# Patient Record
Sex: Male | Born: 1955 | Race: White | Hispanic: No | Marital: Married | Smoking: Former smoker
Health system: Southern US, Community
[De-identification: ages and names within clinical notes are randomized; demographics above are authoritative.]

## PROBLEM LIST (undated history)

## (undated) DIAGNOSIS — D6861 Antiphospholipid syndrome: Secondary | ICD-10-CM

## (undated) DIAGNOSIS — I10 Essential (primary) hypertension: Secondary | ICD-10-CM

## (undated) DIAGNOSIS — I4510 Unspecified right bundle-branch block: Secondary | ICD-10-CM

## (undated) DIAGNOSIS — Z9861 Coronary angioplasty status: Secondary | ICD-10-CM

## (undated) DIAGNOSIS — I2510 Atherosclerotic heart disease of native coronary artery without angina pectoris: Secondary | ICD-10-CM

## (undated) DIAGNOSIS — G4733 Obstructive sleep apnea (adult) (pediatric): Secondary | ICD-10-CM

## (undated) DIAGNOSIS — J449 Chronic obstructive pulmonary disease, unspecified: Secondary | ICD-10-CM

## (undated) DIAGNOSIS — I2699 Other pulmonary embolism without acute cor pulmonale: Secondary | ICD-10-CM

## (undated) DIAGNOSIS — I739 Peripheral vascular disease, unspecified: Secondary | ICD-10-CM

## (undated) DIAGNOSIS — E119 Type 2 diabetes mellitus without complications: Secondary | ICD-10-CM

## (undated) DIAGNOSIS — G450 Vertebro-basilar artery syndrome: Secondary | ICD-10-CM

## (undated) DIAGNOSIS — R760 Raised antibody titer: Secondary | ICD-10-CM

## (undated) DIAGNOSIS — G4730 Sleep apnea, unspecified: Secondary | ICD-10-CM

## (undated) HISTORY — PX: BACK SURGERY: SHX140

## (undated) HISTORY — PX: COLONOSCOPY: SHX174

## (undated) HISTORY — PX: APPENDECTOMY: SHX54

## (undated) HISTORY — PX: CORONARY ANGIOPLASTY WITH STENT PLACEMENT: SHX49

## (undated) HISTORY — DX: Obstructive sleep apnea (adult) (pediatric): G47.33

## (undated) HISTORY — DX: Essential (primary) hypertension: I10

## (undated) HISTORY — DX: Raised antibody titer: R76.0

## (undated) HISTORY — DX: Atherosclerotic heart disease of native coronary artery without angina pectoris: I25.10

## (undated) HISTORY — DX: Vertebro-basilar artery syndrome: G45.0

## (undated) HISTORY — DX: Chronic obstructive pulmonary disease, unspecified: J44.9

## (undated) HISTORY — DX: Unspecified right bundle-branch block: I45.10

## (undated) HISTORY — DX: Type 2 diabetes mellitus without complications: E11.9

## (undated) NOTE — *Deleted (*Deleted)
Name: Jose [name] MRN: [id number] DOB: 08/28/1955 53 y.o. PCP: Jose Jenkins, MD  Date of Admission: 01/18/2020  1:57 PM Date of Discharge:  Attending Physician: Tyson Alias, *  Discharge Diagnosis: 1. Community acquired pneumonia 2. COPD (chronic obstructive pulmonary disease) (HCC) exacerbation. 3. Non-insulin treated type 2 diabetes mellitus (HCC) 4.  Acute on chronic right heart failure (HCC) 5. Anti-phospholipid syndrome [facility])   Discharge Medications: Allergies as of 01/22/2020      Reactions   Melatonin Anxiety   Ramipril    cough      Medication List    STOP taking these medications   doxycycline 100 MG tablet Commonly known as: VIBRA-TABS   enoxaparin 120 MG/0.8ML injection Commonly known as: LOVENOX Replaced by: enoxaparin 30 MG/0.3ML injection   torsemide 20 MG tablet Commonly known as: DEMADEX     TAKE these medications   accu-chek soft touch lancets 2 (two) times daily.   aspirin 81 MG EC tablet Take 1 tablet (81 mg total) by mouth daily. Swallow whole. Start taking on: January 23, 2020 What changed: additional instructions   Contour Next Test test strip Generic drug: glucose blood Use to check blood sugar twice 2 time(s) daily.   DULoxetine 60 MG capsule Commonly known as: CYMBALTA Take 60 mg by mouth once a week.   enoxaparin 30 MG/0.3ML injection Commonly known as: Lovenox Inject 1.1 mLs (110 mg total) into the skin 2 (two) times daily for 2 days. Replaces: enoxaparin 120 MG/0.8ML injection   Flovent Diskus 100 MCG/BLIST Aepb Generic drug: Fluticasone Propionate (Inhal) Inhale 1 puff into the lungs daily as needed (wheezing & shortness of breath).   gabapentin 600 MG tablet Commonly known as: NEURONTIN Take 600 mg by mouth 3 (three) times daily.   GlucoCom Blood Glucose Monitor Devi 1 each by XX route as directed (For Blood Glucose Monitoring)   Contour Next Monitor w/Device Kit by Does not apply route.    HYDROcodone-acetaminophen 10-325 MG tablet Commonly known as: NORCO Take 1 tablet by mouth 2 (two) times daily.   ipratropium-albuterol 0.5-2.5 (3) MG/3ML Soln Commonly known as: DUONEB Inhale 3 mLs into the lungs at bedtime.   metoprolol tartrate 12.5 mg Tabs tablet Commonly known as: LOPRESSOR Take 12.5 mg by mouth 2 (two) times daily.   nicotine 14 mg/24hr patch Commonly known as: NICODERM CQ - dosed in mg/24 hours Apply one patch daily x 2 weeks.  Begin after completion of 21 mg patches   nitroGLYCERIN 0.4 MG SL tablet Commonly known as: NITROSTAT Place 0.4 mg under the tongue every 5 (five) minutes as needed for chest pain.   potassium chloride 20 MEQ packet Commonly known as: KLOR-CON Take 40 mEq by mouth 2 (two) times daily.   albuterol 108 (90 Base) MCG/ACT inhaler Commonly known as: VENTOLIN HFA Inhale 1 puff into the lungs every 6 (six) hours as needed for wheezing or shortness of breath.   ProAir HFA 108 (90 Base) MCG/ACT inhaler Generic drug: albuterol Inhale 1-2 puffs into the lungs every 4 (four) hours as needed for wheezing.   albuterol (5 MG/ML) 0.5% nebulizer solution Commonly known as: PROVENTIL Take 0.5 mLs (2.5 mg total) by nebulization every 6 (six) hours as needed for wheezing or shortness of breath.   rosuvastatin 10 MG tablet Commonly known as: CRESTOR Take 10 mg by mouth every evening.   Stiolto Respimat 2.5-2.5 MCG/ACT Aers Generic drug: Tiotropium Bromide-Olodaterol Inhale 2 puffs into the lungs daily.   tamsulosin 0.4 MG Caps  capsule Commonly known as: FLOMAX Take 0.4 mg by mouth daily.   tiZANidine 4 MG tablet Commonly known as: ZANAFLEX Take 8 mg by mouth in the morning and at bedtime.   topiramate 50 MG tablet Commonly known as: TOPAMAX Take 50 mg by mouth 2 (two) times daily.   warfarin 7.5 MG tablet Commonly known as: COUMADIN Take 1 tablet (7.5 mg total) by mouth at bedtime for 1 dose. What changed:   medication strength   how much to take  when to take this  additional instructions   warfarin 6 MG tablet Commonly known as: Coumadin Take 1 tablet (6 mg total) by mouth daily for 2 days. What changed:   medication strength  how much to take  additional instructions            Durable Medical Equipment  (From admission, onward)         Start     Ordered   01/22/20 1512  DME 3-in-1  Once        01/22/20 1512          Disposition and follow-up:   Mr.Jose [name] was discharged from [facility] in Stable condition.  At the hospital follow up visit please address:  1.  INR and his home lasix need  2.  Labs / imaging needed at time of follow-up: INR, cystoscopy  3.  Pending labs/ test needing follow-up: None  Follow-up Appointments:  Follow-up Information    Jose Jenkins, MD. Go on 01/25/2020.   Specialty: Internal Medicine Why: @9 :30 AM Contact information: [address] [place] [address] [phone]               Hospital Course by problem list: Acute on chronic hypoxic and hypercapneic respiratory failure in setting of community acquired pneumonia:  Presenting with dyspnea, productive cough with hemoptysis. CXR with medial right lung base opacity. CT Abdomen was significant for patchy consolidation at the right lung base suggestive of multilobar bronchopneumonia. Continues to require 4L supplemental oxygen at this time. He used  BiPAP at night as needed.  He was started on azithromycin and ceftriaxone and stopped on day 4. Continued on Duonebs q6h and albuterol inhaler q4h prn.   Acute metabolic encephalopathy 2/2 hypercarbia from persistent COPD:  He has a history of COPD and obstructive sleep apnea. He was noted to have worsening respiratory acidosis on 11/6 for which he was initiated on BiPAP. Subsequently, had improvement in mental status. He notes that he has both BiPAP and CPAP at home and planning to use it after discharge at  night as needed.   Acute on chronic HFpEF exacerbation: He has history of HFpEF and on torsemide 20mg  qd at home. He has been diuresed with IV Lasix 40mg  twice daily;  he is noted to have ~500cc UOP over past 24 hours. On examination, has improved bilateral lower extremity edema. His Creatinine is 1.10, BUN 22 on the day of discharge. His IV lasix were stopped and he is asked to ee his PCP before starting his home dose torsemide if needed.    Antiphospholipid antibody syndrome with supratherapeutic INR: Patient presented with supratherapeutic INR with hemoptysis and hematuria. His warfarin was held and patient started on heparin gtt. Currently INR of 1.3. No active bleeding noted. Hemoglobin has remained stable. His  IV Heparin was stopped on 01/22/20. He was started on Lovenox 110 mg IV q12hr - until he follows up with his Coumadin Clinic. Plan is to  take Warfarin 7.5 mg po x 1 on 01/22/20 and then start 6 mg po daily until he follows up with his Coumadin clinic   Hematuria:  On admission, patient endorsed one month of hematuria but unable to quantify how much. Denies any associated urinary symptoms. Abdominal exam benign. However, UA was significant for >50 RBCs. Suspect his supratherapeutic INR may have been contributing to his hematuria. CT Abd/Pelvis obtained that was significant for nonobstructing bilateral nephrolithiasis without hydronephrosis, renal cortical masses or evidence of urothelial lesions.  - Outpatient follow up with Urology for cystoscopy  Discharge Vitals:   BP 127/70 (BP Location: Left Wrist)   Pulse 86   Temp 98.7 F (37.1 C)   Resp 16   Ht 5' 7.01" (1.702 m)   Wt 247 lb 5.7 oz (112.2 kg)   SpO2 92%   BMI 38.73 kg/m   Pertinent Labs, Studies, and Procedures:  CBC Latest Ref Rng & Units 01/22/2020 01/21/2020 01/20/2020  WBC 4.0 - 10.5 K/uL 11.3(H) 10.6(H) 12.3(H)  Hemoglobin 13.0 - 17.0 g/dL 12.8(L) 13.3 14.2  Hematocrit 39 - 52 % 41.8 43.0 46.0  Platelets 150 - 400  K/uL 339 300 379   BMP Latest Ref Rng & Units 01/22/2020 01/21/2020 01/20/2020  Glucose 70 - 99 mg/dL 045(W) 098(J) 191(Y)  BUN 8 - 23 mg/dL 22 16 14   Creatinine 0.61 - 1.24 mg/dL 7.82 9.56 2.13  Sodium 135 - 145 mmol/L 141 141 138  Potassium 3.5 - 5.1 mmol/L 3.3(L) 3.8 3.9  Chloride 98 - 111 mmol/L 96(L) 98 98  CO2 22 - 32 mmol/L 35(H) 31 31  Calcium 8.9 - 10.3 mg/dL 0.8(M) 8.9 9.2   Prothrombin Time: 16 (on 01/22/20), 16.6 (01/21/20), 22.4 ( 01/20/20) INR: 1.3 (on 01/22/20), 1.4 (01/21/20), 2.1 ( 01/20/20)    Chest Xray : IMPRESSION: Mild ill-defined opacity within the medial right lung base. Findings may reflect atelectasis or pneumonia. PA and lateral view chest radiograph with improved inspiratory effort may be helpful for further evaluation. Unchanged cardiomegaly. CT abdomen pelvis: IMPRESSION: 1. Nonobstructing bilateral nephrolithiasis. No hydronephrosis. No ureteral or bladder stones. No renal cortical masses. No evidence of urothelial lesions. 2. Patchy consolidation at the right lung base. Patchy tree-in-bud opacities in the dependent basilar left lower lobe. Findings are suggestive of multilobar bronchopneumonia. Chest radiograph or chest CT follow-up suggested in 2-3 months. 3. Mild bilateral external iliac lymphadenopathy, nonspecific. Suggest follow-up CT abdomen/pelvis with oral and IV contrast in 3 months. This recommendation follows ACR consensus guidelines: White Paper of the ACR Incidental Findings Committee II on Splenic and Nodal Findings. J Am Coll Radiol 2013;10:789-794. 4. Diffuse hepatic steatosis. 5. Mild colonic diverticulosis. 6. Coronary atherosclerosis. 7. Aortic Atherosclerosis (ICD10-I70.0).    Discharge Instructions: Discharge Instructions    Call MD for:  temperature >100.4   Complete by: As directed    Diet - low sodium heart healthy   Complete by: As directed    Discharge instructions   Complete by: As directed    Mr. Mckillop: You  presented with Acute on chronic hypoxic and hypercapneic respiratory failure in setting of community acquired pneumonia: You were started on azithromycin and ceftriaxone for t/t of pneumonia and started back on 4L supplemental oxygen .CXR with medial right lung base opacity.CT Abdomen was significant for patchy consolidation at the right lung base suggestive of multilobar bronchopneumonia.Plan is to stop antibiotics today but continue Duonebs q6h and albuterol inhaler q4h prn. You had Acute metabolic encephalopathy2/2 hypercarbia from persistent COPD:You have a  history of COPD and obstructive sleep apnea.Noted to have worsening respiratory acidosis on 11/6 for which you were initiated on BiPAP. Subsequently, had improvement in mental status. You stated you have both BiPAP and CPAP at home and planning to use it after discharge at night as needed. Acute on chronic HFpEF exacerbation: You have history of HFpEF on torsemide 20mg  qd at home.You were diuresed with IV Lasix 40mg  twice daily;  noted to have ~500cc UOP over past 24 hours.. On examination, had improved bilateral lower extremity edema.Creatinine is 1.10, BUN 22. Plan is to stop lasix today and see Cardiology before restarting them but continue metoprolol 12.5mg  bid Antiphospholipid antibody syndrome with supratherapeutic INR: You presented with supratherapeutic INR with hemoptysis and hematuria.  Warfarin was held and were started on heparin gtt. Currently INR of 1.3. No active bleeding noted. Hemoglobin has remained stable.  - Stop IV Heparin today and bridge to warfarin along with Lovenox bridge. Lovenox 110 mg IV q12 hr until you follow your coumadin clinic. Take Warfarin 7.5 mg 1 dose tonight and the start 6 mg PO daily until you follow your coumadin clinic.    Hematuria:  You endorsed one month of hematuria but unable to quantify how much. Denied any associated urinary symptoms. Abdominal exam benign. However, UA was significant for >50  RBCs. Suspect his supratherapeutic INR may have been contributing to his hematuria.CT Abd/Pelvis obtained that was significant for nonobstructing bilateral nephrolithiasis without hydronephrosis, renal cortical masses or evidence of urothelial lesions.  - Outpatient follow up with Urology for cystoscopy   Increase activity slowly   Complete by: As directed       Signed: Arnoldo Lenis, MD 01/22/2020, 3:37 PM   Pager: [phone]

---

## 2009-03-16 DIAGNOSIS — I2699 Other pulmonary embolism without acute cor pulmonale: Secondary | ICD-10-CM

## 2009-03-16 HISTORY — DX: Other pulmonary embolism without acute cor pulmonale: I26.99

## 2011-09-12 DIAGNOSIS — D649 Anemia, unspecified: Secondary | ICD-10-CM | POA: Insufficient documentation

## 2012-08-09 DIAGNOSIS — N529 Male erectile dysfunction, unspecified: Secondary | ICD-10-CM | POA: Insufficient documentation

## 2015-10-15 DIAGNOSIS — I779 Disorder of arteries and arterioles, unspecified: Secondary | ICD-10-CM

## 2015-10-15 HISTORY — DX: Disorder of arteries and arterioles, unspecified: I77.9

## 2015-10-17 DIAGNOSIS — R0609 Other forms of dyspnea: Secondary | ICD-10-CM | POA: Insufficient documentation

## 2016-08-16 ENCOUNTER — Encounter (HOSPITAL_COMMUNITY): Payer: Self-pay | Admitting: Emergency Medicine

## 2016-08-16 ENCOUNTER — Observation Stay (HOSPITAL_COMMUNITY)
Admission: EM | Admit: 2016-08-16 | Discharge: 2016-08-17 | Disposition: A | Discharge status: Home / Self Care | Payer: BLUE CROSS/BLUE SHIELD | Attending: Internal Medicine | Admitting: Internal Medicine

## 2016-08-16 ENCOUNTER — Emergency Department (HOSPITAL_COMMUNITY): Payer: BLUE CROSS/BLUE SHIELD

## 2016-08-16 DIAGNOSIS — Z7901 Long term (current) use of anticoagulants: Secondary | ICD-10-CM

## 2016-08-16 DIAGNOSIS — I779 Disorder of arteries and arterioles, unspecified: Secondary | ICD-10-CM | POA: Diagnosis not present

## 2016-08-16 DIAGNOSIS — I200 Unstable angina: Secondary | ICD-10-CM | POA: Diagnosis present

## 2016-08-16 DIAGNOSIS — I208 Other forms of angina pectoris: Secondary | ICD-10-CM | POA: Diagnosis present

## 2016-08-16 DIAGNOSIS — I252 Old myocardial infarction: Secondary | ICD-10-CM | POA: Insufficient documentation

## 2016-08-16 DIAGNOSIS — I10 Essential (primary) hypertension: Secondary | ICD-10-CM | POA: Diagnosis present

## 2016-08-16 DIAGNOSIS — G4733 Obstructive sleep apnea (adult) (pediatric): Secondary | ICD-10-CM | POA: Diagnosis not present

## 2016-08-16 DIAGNOSIS — N400 Enlarged prostate without lower urinary tract symptoms: Secondary | ICD-10-CM | POA: Diagnosis not present

## 2016-08-16 DIAGNOSIS — D6861 Antiphospholipid syndrome: Secondary | ICD-10-CM | POA: Diagnosis present

## 2016-08-16 DIAGNOSIS — Z955 Presence of coronary angioplasty implant and graft: Secondary | ICD-10-CM | POA: Insufficient documentation

## 2016-08-16 DIAGNOSIS — I2089 Other forms of angina pectoris: Secondary | ICD-10-CM | POA: Diagnosis present

## 2016-08-16 DIAGNOSIS — Z86711 Personal history of pulmonary embolism: Secondary | ICD-10-CM | POA: Diagnosis present

## 2016-08-16 DIAGNOSIS — E669 Obesity, unspecified: Secondary | ICD-10-CM | POA: Diagnosis not present

## 2016-08-16 DIAGNOSIS — F17210 Nicotine dependence, cigarettes, uncomplicated: Secondary | ICD-10-CM | POA: Insufficient documentation

## 2016-08-16 DIAGNOSIS — E782 Mixed hyperlipidemia: Secondary | ICD-10-CM

## 2016-08-16 DIAGNOSIS — Z79899 Other long term (current) drug therapy: Secondary | ICD-10-CM | POA: Insufficient documentation

## 2016-08-16 DIAGNOSIS — Z7982 Long term (current) use of aspirin: Secondary | ICD-10-CM | POA: Diagnosis not present

## 2016-08-16 DIAGNOSIS — Z7902 Long term (current) use of antithrombotics/antiplatelets: Secondary | ICD-10-CM | POA: Diagnosis not present

## 2016-08-16 DIAGNOSIS — E1169 Type 2 diabetes mellitus with other specified complication: Secondary | ICD-10-CM

## 2016-08-16 DIAGNOSIS — I25110 Atherosclerotic heart disease of native coronary artery with unstable angina pectoris: Principal | ICD-10-CM | POA: Insufficient documentation

## 2016-08-16 DIAGNOSIS — E785 Hyperlipidemia, unspecified: Secondary | ICD-10-CM | POA: Diagnosis not present

## 2016-08-16 DIAGNOSIS — Z9861 Coronary angioplasty status: Secondary | ICD-10-CM | POA: Diagnosis not present

## 2016-08-16 DIAGNOSIS — J449 Chronic obstructive pulmonary disease, unspecified: Secondary | ICD-10-CM | POA: Diagnosis not present

## 2016-08-16 DIAGNOSIS — Z720 Tobacco use: Secondary | ICD-10-CM | POA: Diagnosis present

## 2016-08-16 DIAGNOSIS — G4730 Sleep apnea, unspecified: Secondary | ICD-10-CM | POA: Diagnosis present

## 2016-08-16 DIAGNOSIS — E119 Type 2 diabetes mellitus without complications: Secondary | ICD-10-CM

## 2016-08-16 DIAGNOSIS — Z6837 Body mass index (BMI) 37.0-37.9, adult: Secondary | ICD-10-CM | POA: Diagnosis not present

## 2016-08-16 DIAGNOSIS — I739 Peripheral vascular disease, unspecified: Secondary | ICD-10-CM

## 2016-08-16 DIAGNOSIS — R079 Chest pain, unspecified: Secondary | ICD-10-CM | POA: Diagnosis present

## 2016-08-16 DIAGNOSIS — I4510 Unspecified right bundle-branch block: Secondary | ICD-10-CM | POA: Diagnosis not present

## 2016-08-16 DIAGNOSIS — I2510 Atherosclerotic heart disease of native coronary artery without angina pectoris: Secondary | ICD-10-CM

## 2016-08-16 HISTORY — DX: Sleep apnea, unspecified: G47.30

## 2016-08-16 HISTORY — DX: Coronary angioplasty status: Z98.61

## 2016-08-16 HISTORY — DX: Atherosclerotic heart disease of native coronary artery without angina pectoris: I25.10

## 2016-08-16 HISTORY — DX: Chronic obstructive pulmonary disease, unspecified: J44.9

## 2016-08-16 HISTORY — DX: Essential (primary) hypertension: I10

## 2016-08-16 HISTORY — DX: Unspecified right bundle-branch block: I45.10

## 2016-08-16 HISTORY — DX: Other pulmonary embolism without acute cor pulmonale: I26.99

## 2016-08-16 HISTORY — DX: Antiphospholipid syndrome: D68.61

## 2016-08-16 HISTORY — DX: Type 2 diabetes mellitus without complications: E11.9

## 2016-08-16 HISTORY — DX: Peripheral vascular disease, unspecified: I73.9

## 2016-08-16 LAB — LIPID PANEL
CHOL/HDL RATIO: 5.6 ratio
Cholesterol: 156 mg/dL (ref 0–200)
HDL: 28 mg/dL — AB (ref >40)
LDL CALC: 64 mg/dL (ref 0–99)
TRIGLYCERIDES: 322 mg/dL — AB (ref <150)
VLDL: 64 mg/dL — ABNORMAL HIGH (ref 0–40)

## 2016-08-16 LAB — CBC
HCT: 42.5 % (ref 39.0–52.0)
Hemoglobin: 13.7 g/dL (ref 13.0–17.0)
MCH: 30.4 pg (ref 26.0–34.0)
MCHC: 32.2 g/dL (ref 30.0–36.0)
MCV: 94.2 fL (ref 78.0–100.0)
PLATELETS: 241 10*3/uL (ref 150–400)
RBC: 4.51 MIL/uL (ref 4.22–5.81)
RDW: 13.7 % (ref 11.5–15.5)
WBC: 14.8 10*3/uL — ABNORMAL HIGH (ref 4.0–10.5)

## 2016-08-16 LAB — TROPONIN I: Troponin I: <0.03 ng/mL (ref <0.03)

## 2016-08-16 LAB — I-STAT TROPONIN, ED: TROPONIN I, POC: 0 ng/mL (ref 0.00–0.08)

## 2016-08-16 LAB — BASIC METABOLIC PANEL
Anion gap: 4 — ABNORMAL LOW (ref 5–15)
BUN: 14 mg/dL (ref 6–20)
CALCIUM: 8.4 mg/dL — AB (ref 8.9–10.3)
CO2: 28 mmol/L (ref 22–32)
CREATININE: 0.7 mg/dL (ref 0.61–1.24)
Chloride: 108 mmol/L (ref 101–111)
GFR calc non Af Amer: >60 mL/min (ref >60)
Glucose, Bld: 124 mg/dL — ABNORMAL HIGH (ref 65–99)
Potassium: 3.7 mmol/L (ref 3.5–5.1)
Sodium: 140 mmol/L (ref 135–145)

## 2016-08-16 LAB — HEPARIN LEVEL (UNFRACTIONATED): Heparin Unfractionated: 0.29 IU/mL — ABNORMAL LOW (ref 0.30–0.70)

## 2016-08-16 LAB — PROTIME-INR
INR: 1.78
Prothrombin Time: 20.9 seconds — ABNORMAL HIGH (ref 11.4–15.2)

## 2016-08-16 LAB — MRSA PCR SCREENING: MRSA by PCR: NEGATIVE

## 2016-08-16 MED ORDER — ROSUVASTATIN CALCIUM 10 MG PO TABS
10.0000 mg | ORAL_TABLET | Freq: Every evening | ORAL | Status: DC
  Administered 2016-08-16: 10 mg via ORAL
  Filled 2016-08-16: qty 1

## 2016-08-16 MED ORDER — WARFARIN SODIUM 5 MG PO TABS: 5.0000 mg | ORAL_TABLET | Freq: Once | ORAL | Status: DC

## 2016-08-16 MED ORDER — WARFARIN SODIUM 2 MG PO TABS: 8.0000 mg | ORAL_TABLET | Freq: Once | ORAL | Status: DC

## 2016-08-16 MED ORDER — HEPARIN (PORCINE) IN NACL 100-0.45 UNIT/ML-% IJ SOLN
1350.0000 [IU]/h | INTRAMUSCULAR | Status: DC
  Administered 2016-08-16: 1250 [IU]/h via INTRAVENOUS
  Administered 2016-08-17: 1350 [IU]/h via INTRAVENOUS
  Filled 2016-08-16 (×2): qty 250

## 2016-08-16 MED ORDER — CLOPIDOGREL BISULFATE 75 MG PO TABS
75.0000 mg | ORAL_TABLET | Freq: Every day | ORAL | Status: DC
  Administered 2016-08-17: 75 mg via ORAL
  Filled 2016-08-16 (×2): qty 1

## 2016-08-16 MED ORDER — ACETAMINOPHEN 325 MG PO TABS: 650.0000 mg | ORAL_TABLET | ORAL | Status: DC | PRN

## 2016-08-16 MED ORDER — ALPRAZOLAM 0.25 MG PO TABS: 0.2500 mg | ORAL_TABLET | Freq: Two times a day (BID) | ORAL | Status: DC | PRN

## 2016-08-16 MED ORDER — ALBUTEROL SULFATE HFA 108 (90 BASE) MCG/ACT IN AERS: 1.0000 | INHALATION_SPRAY | RESPIRATORY_TRACT | Status: DC | PRN

## 2016-08-16 MED ORDER — ONDANSETRON HCL 4 MG/2ML IJ SOLN: 4.0000 mg | Freq: Four times a day (QID) | INTRAMUSCULAR | Status: DC | PRN

## 2016-08-16 MED ORDER — METOPROLOL SUCCINATE ER 100 MG PO TB24
100.0000 mg | ORAL_TABLET | Freq: Every day | ORAL | Status: DC
  Administered 2016-08-16 – 2016-08-17 (×2): 100 mg via ORAL
  Filled 2016-08-16 (×2): qty 1

## 2016-08-16 MED ORDER — ISOSORBIDE MONONITRATE ER 30 MG PO TB24
60.0000 mg | ORAL_TABLET | Freq: Every day | ORAL | Status: DC
  Filled 2016-08-16: qty 2

## 2016-08-16 MED ORDER — ISOSORBIDE MONONITRATE ER 30 MG PO TB24: 60.0000 mg | ORAL_TABLET | Freq: Every day | ORAL | Status: DC

## 2016-08-16 MED ORDER — TAMSULOSIN HCL 0.4 MG PO CAPS
0.4000 mg | ORAL_CAPSULE | Freq: Every day | ORAL | Status: DC
  Administered 2016-08-16 – 2016-08-17 (×2): 0.4 mg via ORAL
  Filled 2016-08-16 (×2): qty 1

## 2016-08-16 MED ORDER — MORPHINE SULFATE (PF) 4 MG/ML IV SOLN: 1.0000 mg | INTRAVENOUS | Status: DC | PRN

## 2016-08-16 MED ORDER — DIPHENHYDRAMINE HCL 50 MG/ML IJ SOLN
12.5000 mg | Freq: Once | INTRAMUSCULAR | Status: AC
  Administered 2016-08-16: 12.5 mg via INTRAVENOUS
  Filled 2016-08-16: qty 1

## 2016-08-16 MED ORDER — HEPARIN BOLUS VIA INFUSION
4000.0000 [IU] | Freq: Once | INTRAVENOUS | Status: DC
Start: 2016-08-16 — End: 2016-08-16
  Filled 2016-08-16: qty 4000

## 2016-08-16 MED ORDER — ASPIRIN EC 81 MG PO TBEC
81.0000 mg | DELAYED_RELEASE_TABLET | Freq: Every day | ORAL | Status: DC
  Administered 2016-08-17: 81 mg via ORAL
  Filled 2016-08-16 (×2): qty 1

## 2016-08-16 MED ORDER — LOSARTAN POTASSIUM 50 MG PO TABS
50.0000 mg | ORAL_TABLET | Freq: Every day | ORAL | Status: DC
  Administered 2016-08-16 – 2016-08-17 (×2): 50 mg via ORAL
  Filled 2016-08-16 (×2): qty 1

## 2016-08-16 MED ORDER — WARFARIN - PHARMACIST DOSING INPATIENT: Freq: Every day | Status: DC

## 2016-08-16 MED ORDER — PANTOPRAZOLE SODIUM 40 MG PO TBEC
40.0000 mg | DELAYED_RELEASE_TABLET | Freq: Two times a day (BID) | ORAL | Status: DC
  Administered 2016-08-16 – 2016-08-17 (×3): 40 mg via ORAL
  Filled 2016-08-16 (×3): qty 1

## 2016-08-16 MED ORDER — NICOTINE POLACRILEX 2 MG MT GUM
2.0000 mg | CHEWING_GUM | OROMUCOSAL | Status: DC | PRN
  Filled 2016-08-16: qty 1

## 2016-08-16 MED ORDER — NITROGLYCERIN IN D5W 200-5 MCG/ML-% IV SOLN
10.0000 ug/min | INTRAVENOUS | Status: DC
  Administered 2016-08-16: 10 ug/min via INTRAVENOUS
  Filled 2016-08-16: qty 250

## 2016-08-16 MED ORDER — HEPARIN (PORCINE) IN NACL 100-0.45 UNIT/ML-% IJ SOLN
1250.0000 [IU]/h | INTRAMUSCULAR | Status: DC
  Filled 2016-08-16: qty 250

## 2016-08-16 MED ORDER — METOCLOPRAMIDE HCL 5 MG/ML IJ SOLN
10.0000 mg | Freq: Once | INTRAMUSCULAR | Status: AC
  Administered 2016-08-16: 10 mg via INTRAVENOUS
  Filled 2016-08-16: qty 2

## 2016-08-16 MED ORDER — FUROSEMIDE 20 MG PO TABS
20.0000 mg | ORAL_TABLET | Freq: Every day | ORAL | Status: DC
Start: 2016-08-16 — End: 2016-08-17
  Administered 2016-08-16 – 2016-08-17 (×2): 20 mg via ORAL
  Filled 2016-08-16 (×2): qty 1

## 2016-08-16 MED ORDER — TOPIRAMATE 25 MG PO TABS
50.0000 mg | ORAL_TABLET | Freq: Two times a day (BID) | ORAL | Status: DC
Start: 2016-08-16 — End: 2016-08-17
  Administered 2016-08-16 – 2016-08-17 (×3): 50 mg via ORAL
  Filled 2016-08-16 (×3): qty 2

## 2016-08-16 MED ORDER — ALBUTEROL SULFATE (2.5 MG/3ML) 0.083% IN NEBU
3.0000 mL | INHALATION_SOLUTION | RESPIRATORY_TRACT | Status: DC | PRN
Start: 2016-08-16 — End: 2016-08-17
  Filled 2016-08-16 (×2): qty 3

## 2016-08-16 NOTE — ED Triage Notes (Signed)
Pt reports sudden onset chest pain 0230. Pt has significant cardiac history. Normal presentation is jaw and shoulder pain. Had a total of 4 nitro and 324 asa pta.

## 2016-08-16 NOTE — Progress Notes (Signed)
Patient placed on CPAP without complication. Patient states that he may take it off due to being accustomed to nasal pillows.

## 2016-08-16 NOTE — Progress Notes (Addendum)
ANTICOAGULATION CONSULT NOTE - Initial Consult  Pharmacy Consult for Heparin Indication: chest pain/ACS   No Known Allergies  Patient Measurements: Height: 5\' 7"  (170.2 cm) Weight: 230 lb (104.3 kg) IBW/kg (Calculated) : 66.1 Heparin Dosing Weight: 90kg  Vital Signs: Temp: 98.7 F (37.1 C) (06/03 1222) Temp Source: Oral (06/03 1222) BP: 135/75 (06/03 1222) Pulse Rate: 80 (06/03 1222)  Labs:  Recent Labs  08/16/16 0507 08/16/16 0922 08/16/16 1309  HGB 13.7  --   --   HCT 42.5  --   --   PLT 241  --   --   LABPROT 20.9*  --   --   INR 1.78  --   --   HEPARINUNFRC  --   --  0.29*  CREATININE 0.70  --   --   TROPONINI  --  <0.03  --     Estimated Creatinine Clearance: 111.6 mL/min (by C-G formula based on SCr of 0.7 mg/dL).   Medical History: Past Medical History:  Diagnosis Date  . Antiphospholipid antibody syndrome (HCC)   . CAD S/P percutaneous coronary angioplasty '98, '04, Feb 2017   [facility]  . Carotid arterial disease (HCC) 10/2015   moderate bilateral  . COPD (chronic obstructive pulmonary disease) (HCC)   . Coronary artery disease   . Hypertension   . Non-insulin treated type 2 diabetes mellitus (HCC)   . Pulmonary emboli (HCC) 2011  . RBBB   . Sleep apnea     Assessment: 61yo male c/o CP, took NTG and ASA at home, istat troponin negative, to begin heparin; of note pt is on Coumadin PTA (indication unclear), current INR <2, last dose taken 6/2.  Heparin level = 0.29  Goal of Therapy:  Heparin level 0.3-0.7 units/ml Monitor platelets by anticoagulation protocol: Yes   Plan:  Heparin to 1350 units / hr Follow up AM labs Follow up plans for cath  Thank you Okey RegalLisa Powell, PharmD [phone]  08/16/2016,2:40 PM   Addendum -per MD may give coumadin tonight -INR= 1.78 -Home dose: 5mg /day except 7.5mg  MWF  Plan -Coumadin 5mg  po tonight -Daily PT/INR  Harland GermanAndrew Tylan Kinn, Pharm D 08/16/2016 7:01 PM

## 2016-08-16 NOTE — ED Provider Notes (Signed)
[facility]-EMERGENCY DEPT Provider Note   CSN: [id number] Arrival date & time: 08/16/16  0501     History   Chief Complaint Chief Complaint  Patient presents with  . Chest Pain    HPI Jose [name] is a 61 y.o. male hx of COPD, CAD s/p 7 stents, HTN, Antiphospholipid syndrome on Coumadin who presenting with chest pain. Patient states that he was trying to get to bed around 2:30 AM when he had sudden onset of jaw pain and substernal chest pain and that radiated down his left arm. He has some shortness of breath at that time as well. He took full dose aspirin and 3 nitroglycerin but still had 6 out of 10 pain. Came here for evaluation. Of note, patient had 2 cardiac stents done at [facility] about a year ago for similar symptoms. He just moved here to [facility] and has no cardiologist here.   The history is provided by the patient.    Past Medical History:  Diagnosis Date  . COPD (chronic obstructive pulmonary disease) (HCC)   . Coronary artery disease   . Hypertension     There are no active problems to display for this patient.   No past surgical history on file.     Home Medications    Prior to Admission medications   Not on File    Family History No family history on file.  Social History Social History  Substance Use Topics  . Smoking status: Not on file  . Smokeless tobacco: Not on file  . Alcohol use Not on file     Allergies   Patient has no allergy information on record.   Review of Systems Review of Systems  Cardiovascular: Positive for chest pain.  All other systems reviewed and are negative.    Physical Exam Updated Vital Signs BP 133/70 (BP Location: Right Arm)   Pulse 92   Temp 98 F (36.7 C) (Oral)   Resp 20   SpO2 97%   Physical Exam  Constitutional: He is oriented to person, place, and time.  Slightly uncomfortable   HENT:  Head: Normocephalic.  Mouth/Throat: Oropharynx is clear and moist.  Eyes: Conjunctivae and EOM are normal.  Pupils are equal, round, and reactive to light.  Neck: Normal range of motion. Neck supple.  Cardiovascular: Normal rate, regular rhythm and normal heart sounds.   Pulmonary/Chest: Effort normal and breath sounds normal. No respiratory distress. He has no wheezes. He has no rales.  Abdominal: Soft. Bowel sounds are normal. He exhibits no distension. There is no tenderness.  Musculoskeletal: Normal range of motion. He exhibits no edema.  Neurological: He is alert and oriented to person, place, and time. No cranial nerve deficit. Coordination normal.  Skin: Skin is warm.  Psychiatric: He has a normal mood and affect.  Nursing note and vitals reviewed.    ED Treatments / Results  Labs (all labs ordered are listed, but only abnormal results are displayed) Labs Reviewed  BASIC METABOLIC PANEL  CBC  PROTIME-INR  I-STAT TROPOININ, ED    EKG  EKG Interpretation  Date/Time:  Sunday August 16 2016 05:04:55 EDT Ventricular Rate:  92 PR Interval:    QRS Duration: 155 QT Interval:  378 QTC Calculation: 468 R Axis:   87 Text Interpretation:  Sinus rhythm Ventricular premature complex Borderline prolonged PR interval Right bundle branch block No previous ECGs available Confirmed by Richardean Canal [phone]) on 08/16/2016 5:25:01 AM       Radiology No results found.  Procedures Procedures (including critical care time)  CRITICAL CARE Performed by: Richardean Canalavid H Ajiah Mcglinn   Total critical care time: 30 minutes  Critical care time was exclusive of separately billable procedures and treating other patients.  Critical care was necessary to treat or prevent imminent or life-threatening deterioration.  Critical care was time spent personally by me on the following activities: development of treatment plan with patient and/or surrogate as well as nursing, discussions with consultants, evaluation of patient's response to treatment, examination of patient, obtaining history from patient or surrogate,  ordering and performing treatments and interventions, ordering and review of laboratory studies, ordering and review of radiographic studies, pulse oximetry and re-evaluation of patient's condition.   Medications Ordered in ED Medications  nitroGLYCERIN 50 mg in dextrose 5 % 250 mL (0.2 mg/mL) infusion (not administered)  metoCLOPramide (REGLAN) injection 10 mg (not administered)  diphenhydrAMINE (BENADRYL) injection 12.5 mg (not administered)     Initial Impression / Assessment and Plan / ED Course  I have reviewed the triage vital signs and the nursing notes.  Pertinent labs & imaging results that were available during my care of the patient were reviewed by me and considered in my medical decision making (see chart for details).     [name] is a 61 y.o. male here with chest pain. Has 7 cardiac stents with similar symptoms. Consider unstable angina. Has no records [facility], [facility] Will start nitro drip. Will consult cardiology.   5:44 AM I talked to Dr. Orson AloeHenderson from cardiology. Given initial trop neg, he will see patient but recommend admission to medicine service. Will try to get record from [facility].  6:08 AM INR 1.8. Put on heparin drip. Hospitalist to admit to stepdown for unstable angina.   Final Clinical Impressions(s) / ED Diagnoses   Final diagnoses:  None    New Prescriptions New Prescriptions   No medications on file     Charlynne PanderYao, Avraham Benish Hsienta, MD 08/16/16 [phone]

## 2016-08-16 NOTE — Consult Note (Signed)
Cardiology Consultation:   Patient ID: Jose [name]; [id number]; Aug 16, 1955   Admit date: 08/16/2016 Date of Consult: 08/16/2016  Primary Care Provider: [name], MD Primary Cardiologist: new (formerly Dr Jesse Fall)    Patient Profile:   Jose [name] is a 61 y.o. male with a hx of CAD who is being seen today for the evaluation of chest pain at the request of Dr Willette Pa.  History of Present Illness:   Jose [name] is a complicated 61 y/o obese male with a history of multiple PCI's at [facility], hypercoagulable state on Coumadin, HTN, DM-diet, HLD, sleep apnea, and moderate carotids disease. He moved to [place] 5 months ago and has had no follow up since. He has antiphospholipid antibody syndrome and needs to have venipuncture INR's but hasn't had one in months. His last PCI was Feb 2017. His cath showed 50% ostial LAD, occluded mCFX, 70% mRCA ISR, and 70% distal RCA. The RCA Lesions were treated with DES. Dr Christell Constant said he felt it would be difficult to intervene on the CFX lesion. The pt did have a Myoview and echo in Sept 2017 but those reports were not available to me.    He recently was placed on C-pap and noted some SOB at night with this. He says he has been working with them to adjust it but last night he had SOB and jaw, Lt shoulder, and Lt arm pain typical of his angina symptoms. He took NTG with no relief and EMS was called. His Troponin have been negative. His INR was sub therapeutic and Heparin has been started. He would like to transfer his cardiology care here.    Past Medical History:  Diagnosis Date  . Antiphospholipid antibody syndrome (HCC)   . CAD S/P percutaneous coronary angioplasty '98, '04, Feb 2017   [facility]  . Carotid arterial disease (HCC) 10/2015   moderate bilateral  . COPD (chronic obstructive pulmonary disease) (HCC)   . Coronary artery disease   . Hypertension   . Non-insulin treated type 2 diabetes mellitus (HCC)   . Pulmonary emboli (HCC) 2011  . RBBB    . Sleep apnea     Past Surgical History:  Procedure Laterality Date  . APPENDECTOMY    . BACK SURGERY  '89, '06  . COLONOSCOPY    . CORONARY ANGIOPLASTY WITH STENT PLACEMENT  '98, '04, Feb 2017     Inpatient Medications: Scheduled Meds: . [START ON 08/17/2016] aspirin EC  81 mg Oral Daily  . [START ON 08/17/2016] clopidogrel  75 mg Oral Daily  . furosemide  20 mg Oral Daily  . [START ON 08/17/2016] isosorbide mononitrate  60 mg Oral Daily  . losartan  50 mg Oral Daily  . metoprolol succinate  100 mg Oral Daily  . pantoprazole  40 mg Oral BID  . rosuvastatin  10 mg Oral QPM  . tamsulosin  0.4 mg Oral Daily  . topiramate  50 mg Oral BID  . warfarin  8 mg Oral ONCE-1800  . Warfarin - Pharmacist Dosing Inpatient   Does not apply q1800   Continuous Infusions: . heparin 1,250 Units/hr (08/16/16 0703)  . nitroGLYCERIN 15 mcg/min (08/16/16 0656)   PRN Meds: acetaminophen, albuterol, ALPRAZolam, morphine injection, nicotine polacrilex, ondansetron (ZOFRAN) IV  Allergies:   No Known Allergies  Social History:   Social History   Social History  . Marital status: Married    Spouse name: N/A  . Number of children: N/A  . Years of education: N/A  Occupational History  . Not on file.   Social History Main Topics  . Smoking status: Current Every Day Smoker    Packs/day: 0.50    Types: Cigarettes  . Smokeless tobacco: Never Used  . Alcohol use No  . Drug use: No  . Sexual activity: Yes   Other Topics Concern  . Not on file   Social History Narrative  . No narrative on file    Family History:   The patient's family history includes CAD (age of onset: 92) in his father.  ROS:  Please see the history of present illness.  ROS  All other ROS reviewed and negative.     Physical Exam/Data:   Vitals:   08/16/16 0700 08/16/16 0730 08/16/16 0900 08/16/16 1000  BP: (!) 97/58 (!) 114/57 138/88 139/85  Pulse: 84 83 80 81  Resp: 16 (!) 25    Temp:      TempSrc:      SpO2:  96% 97% 94% (!) 89%  Weight:      Height:       No intake or output data in the 24 hours ending 08/16/16 1139 Filed Weights   08/16/16 0600  Weight: 230 lb (104.3 kg)   Body mass index is 36.02 kg/m.  General:  Obese male, in no acute distress HEENT: normal Lymph: no adenopathy Neck: no JVD Endocrine:  No thryomegaly Vascular: bilateral CA bruits; FA pulses 2+ bilaterally without bruits  Cardiac:  normal S1, S2; RRR; no murmur  Lungs:  clear to auscultation bilaterally, no wheezing, rhonchi or rales  Abd: obese, soft, nontender, no hepatomegaly  Ext: bilateral LE 1+ edema Musculoskeletal:  No deformities, BUE and BLE strength normal and equal Skin: warm and dry  Neuro:  CNs 2-12 intact, no focal abnormalities noted Psych:  Normal affect    EKG:  The EKG was personally reviewed and demonstrates NSR, RBBB  Relevant CV Studies: Cath 05/14/15- L main ostium 30%  Left Anterior Descending * Ostium LAD 50% Mid LAD 40%  Left Circumflex/Ramus * Mid Circumflex 100% 0 * 2nd Marginal 50%  Right Coronary Artery * Mid RCA 70% previous stent * Distal RCA 70% * Denotes significant lesion.  Ao/BP (asc Ao): 117/67 Mean: 84 mmHg LV: 108/22 EDP: 22 mmHg   INTERVENTIONS Mid RCA 70% to normal - STENT, RESOLUTE INTEGRITY RX 3.5X18MM Distal RCA 70% to 10% - STENT, RESOLUTE INTEGRITY RX 3.5X22MM  Status     Laboratory Data:  Chemistry Recent Labs Lab 08/16/16 0507  NA 140  K 3.7  CL 108  CO2 28  GLUCOSE 124*  BUN 14  CREATININE 0.70  CALCIUM 8.4*  GFRNONAA >60  GFRAA >60  ANIONGAP 4*    No results for input(s): PROT, ALBUMIN, AST, ALT, ALKPHOS, BILITOT in the last 168 hours. Hematology Recent Labs Lab 08/16/16 0507  WBC 14.8*  RBC 4.51  HGB 13.7  HCT 42.5  MCV 94.2  MCH 30.4  MCHC 32.2  RDW 13.7  PLT 241   Cardiac Enzymes Recent Labs Lab 08/16/16 0922  TROPONINI <0.03    Recent Labs Lab 08/16/16 0511  TROPIPOC 0.00    BNPNo results for  input(s): BNP, PROBNP in the last 168 hours.  DDimer No results for input(s): DDIMER in the last 168 hours.  Radiology/Studies:  Dg Chest 2 View  Result Date: 08/16/2016 CLINICAL DATA:  Acute onset of generalized chest pain. Initial encounter. EXAM: CHEST  2 VIEW COMPARISON:  None. FINDINGS: The lungs are well-aerated and  clear. There is no evidence of focal opacification, pleural effusion or pneumothorax. The heart is normal in size; the mediastinal contour is within normal limits. No acute osseous abnormalities are seen. IMPRESSION: No acute cardiopulmonary process seen. Electronically Signed   By: Roanna RaiderJeffery  Chang M.D.   On: 08/16/2016 06:14    Assessment and Plan:   1. Unstable angina- his symptoms are consitant with the pt's typical angina  2. CAD- s/p multiple PCIs "7 stents". His last cath/PCI was Feb 2017  3. Sleep apnea- he was recently placed on C-pap and has been having difficulty with this  4. Hypercoagulable state- antiphospholipid antibody syndrome [facility]  5. Chroninc anticoagulation- on Coumadin- he says INR goal 2-3, closer to 3.  6. HTN- controlled  7. 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 COPD- [facility]- on inhalers and O2 PRN  9. DM- diet  10. Carotid disease- moderate by doppler Aug 2017-50% RICA, 50-69% LICA  Plan: Will discuss with MD- no cath today.    Jolene ProvostSigned, Luke Kilroy, PA-C  08/16/2016 11:39 AM   Personally seen and examined. Agree with above.  61 year old male with multiple cardiac catheterizations, multiple stents as described above with his typical anginal symptoms consisting of arm pain, throat pain with normal troponin, history of PE 15 years ago with antiphospholipid antibody, on chronic Coumadin, obesity, sleep apnea, COPD, carotid artery disease.  Alert, obese, regular rate and rhythm, positive bowel sounds, 1+ edema bilateral lower extremities. 2+ radial pulses   Unstable angina  -  Restarted isosorbide 60  - Continue with metoprolol 100  - He states that he has never had elevated troponin, only symptoms. His main symptom now is throat pain that he feels especially when he takes in a deep breath.  - Continue to encourage tobacco cessation.  - We will wean off nitroglycerin.  - We discussed at length potential options such as cardiac catheterization tomorrow. At this point, we would like to see how he feels in the morning and if troponin remains negative and he improves, no cardiac catheterization. He understands the risks involved with cath. Hopefully we will be able to continue with medical management.  - He currently weighs 230 pounds he states that he used to weigh 155 pounds. Conditioning, tobacco cessation, taking care of his body is of utmost importance.  - Continue IV heparin for now. I'm fine with him getting his first dose of Coumadin this evening.   We will follow along.  Donato SchultzMark Yitzel Shasteen, MD

## 2016-08-16 NOTE — Progress Notes (Signed)
Spoke with Cardiologist on call regarding Coumadin. It was scheduled at 1915, but patient and day shift nurse reported he wasn't to get it in case he goes to cath lab in am. However, Dr.Skains note stated it was okay to give first dose tonight. Called for clarification. Dr. Orson AloeHenderson said to hold it.

## 2016-08-16 NOTE — H&P (Signed)
History and Physical    Jose BaliJeffrey Cincotta EAV:[id number] DOB: 12/12/1955 DOA: 08/16/2016   PCP: Ephriam JenkinsFowler, Walter E, MD   Patient coming from:  Home    Chief Complaint: Chest PaIn   HPI: Jose [name] is a 61 y.o. male [place], with medical history significant for HTN,CAD s/p stents x7 1 year ago at [facility], COPD, APL syndrome on Coumadin, presenting with sudden onset of jaw and Left arm and shoulder pain, similar to his prior MI. Symptoms began around 2:30 am, accompanied by shortness of breath, now resolved. He took 3 NTG and still had 6/10 CP, thus seeking medical attention, Pain notworsened with deep inspiration, movement or exertion.  Had no  episodes since admission.Denies any dizziness or falls. No syncope or presyncope. Denies worsening shortness of breath or cough. Denies any fever or chills. Denies any nausea, vomiting or abdominal pain. Appetite is normal and is trying to lose weight, has lost 25 lbs to date  Denies any leg swelling or calf pain. Denies any headaches or vision changes. Denies any seizures No confusion reported. Last Cardiac cath was on 2017, awaiting records. No recent long distance trips. Denies any new stressors but does have a complex job,  No new meds. Not on hormonal therapy. No new herbal supplements.S He is compliant with meds, and takes ASA and Plavix daily. Denies a history of DM . Smokes 1/2 ppd   No ETOH or recreational drugs.  ED Course:  BP (!) 114/57   Pulse 83   Temp 98 F (36.7 C) (Oral)   Resp (!) 25   Ht 5\' 7"  (1.702 m)   Wt 104.3 kg (230 lb)   SpO2 97%   BMI 36.02 kg/m    troponin 0  EKG with sinus rhythm, ventricular premature complex, right bundle branch block, QTC 468 received nitroglycerin drip, and he was placed in Heparin per pharmacy. Sodium 140 potassium 3.7 creatinine 0.7 white count 14.8 hemoglobin 13.7 platelets 241 PT 20.9 INR 1.78 glucose 124 CXR NAD   Review of Systems:  As per HPI otherwise all  other systems reviewed and are negative  Past Medical History:  Diagnosis Date  . COPD (chronic obstructive pulmonary disease) (HCC)   . Coronary artery disease   . Hypertension     Past Surgical History:  Procedure Laterality Date  . CARDIAC CATHETERIZATION      Social History Social History   Social History  . Marital status: Married    Spouse name: N/A  . Number of children: N/A  . Years of education: N/A   Occupational History  . Not on file.   Social History Main Topics  . Smoking status: Current Every Day Smoker    Packs/day: 0.50    Types: Cigarettes  . Smokeless tobacco: Never Used  . Alcohol use No  . Drug use: No  . Sexual activity: Yes   Other Topics Concern  . Not on file   Social History Narrative  . No narrative on file     No Known Allergies  History reviewed. No pertinent family history.    Prior to Admission medications   Medication Sig Start Date End Date Taking? Authorizing Provider  aspirin EC 81 MG tablet Take 81 mg by mouth daily.   Yes [provider]  clopidogrel (PLAVIX) 75 MG tablet Take 75 mg by mouth daily. 07/17/16  Yes [provider]  furosemide (LASIX) 20 MG tablet Take 20 mg by mouth daily. 07/13/16  Yes [provider]  isosorbide mononitrate (IMDUR) 60 MG 24 hr tablet Take 60 mg by mouth daily. 08/05/16  Yes [provider]  losartan (COZAAR) 50 MG tablet Take 50 mg by mouth daily. 05/25/16  Yes [provider]  metoprolol succinate (TOPROL-XL) 100 MG 24 hr tablet Take 100 mg by mouth daily. 08/04/16  Yes [provider]  nitroGLYCERIN (NITROSTAT) 0.4 MG SL tablet Place 0.4 mg under the tongue every 5 (five) minutes as needed for chest pain.  07/07/16  Yes [provider]  pantoprazole (PROTONIX) 40 MG tablet Take 40 mg by mouth 2 (two) times daily. 06/03/16  Yes [provider]  PROAIR HFA 108 (90 Base) MCG/ACT inhaler Inhale 1-2 puffs into the lungs every 4  (four) hours as needed for wheezing.  07/07/16  Yes [provider]  rosuvastatin (CRESTOR) 10 MG tablet Take 10 mg by mouth every evening. 07/07/16  Yes [provider]  tamsulosin (FLOMAX) 0.4 MG CAPS capsule Take 0.4 mg by mouth daily. 06/05/16  Yes [provider]  topiramate (TOPAMAX) 50 MG tablet Take 50 mg by mouth 2 (two) times daily. 06/05/16  Yes [provider]  warfarin (COUMADIN) 5 MG tablet Take 5-7.5 mg by mouth See admin instructions. Take 7.5 mg on Monday, Wednesday and Friday then take 5 mg all the other days 07/17/16  Yes [provider]    Physical Exam:  Vitals:   08/16/16 0600 08/16/16 0634 08/16/16 0700 08/16/16 0730  BP:  128/70 (!) 97/58 (!) 114/57  Pulse:  88 84 83  Resp:  20 16 (!) 25  Temp:      TempSrc:      SpO2:  96% 96% 97%  Weight: 104.3 kg (230 lb)     Height: 5\' 7"  (1.702 m)      Constitutional: NAD, calm, ill appearing Eyes: PERRL, lids and conjunctivae normal ENMT: Mucous membranes are moist, without exudate or lesions  Neck: normal, supple, no masses, no thyromegaly Respiratory: clear to auscultation bilaterally, no wheezing, no crackles. Normal respiratory effort  Cardiovascular: Regular rate and rhythm,2/6 systolic  murmurs, rubs or gallops.trace  extremity edema. 2+ pedal pulses. No carotid bruits.  Abdomen:  Obese Soft, non tender, No hepatosplenomegaly. Bowel sounds positive.  Musculoskeletal: no clubbing / cyanosis. Moves all extremities Skin: no jaundice, No lesions.  Neurologic: Sensation intact  Strength equal in all extremities Psychiatric:   Alert and oriented x 3. Normal mood.     Labs on Admission: I have personally reviewed following labs and imaging studies  CBC:  Recent Labs Lab 08/16/16 0507  WBC 14.8*  HGB 13.7  HCT 42.5  MCV 94.2  PLT 241    Basic Metabolic Panel:  Recent Labs Lab 08/16/16 0507  NA 140  K 3.7  CL 108  CO2 28  GLUCOSE 124*  BUN 14  CREATININE 0.70    CALCIUM 8.4*    GFR: Estimated Creatinine Clearance: 111.6 mL/min (by C-G formula based on SCr of 0.7 mg/dL).  Liver Function Tests: No results for input(s): AST, ALT, ALKPHOS, BILITOT, PROT, ALBUMIN in the last 168 hours. No results for input(s): LIPASE, AMYLASE in the last 168 hours. No results for input(s): AMMONIA in the last 168 hours.  Coagulation Profile:  Recent Labs Lab 08/16/16 0507  INR 1.78    Cardiac Enzymes: No results for input(s): CKTOTAL, CKMB, CKMBINDEX, TROPONINI in the last 168 hours.  BNP (last 3 results) No results for input(s): PROBNP in the last  8760 hours.  HbA1C: No results for input(s): HGBA1C in the last 72 hours.  CBG: No results for input(s): GLUCAP in the last 168 hours.  Lipid Profile: No results for input(s): CHOL, HDL, LDLCALC, TRIG, CHOLHDL, LDLDIRECT in the last 72 hours.  Thyroid Function Tests: No results for input(s): TSH, T4TOTAL, FREET4, T3FREE, THYROIDAB in the last 72 hours.  Anemia Panel: No results for input(s): VITAMINB12, FOLATE, FERRITIN, TIBC, IRON, RETICCTPCT in the last 72 hours.  Urine analysis: No results found for: COLORURINE, APPEARANCEUR, LABSPEC, PHURINE, GLUCOSEU, HGBUR, BILIRUBINUR, KETONESUR, PROTEINUR, UROBILINOGEN, NITRITE, LEUKOCYTESUR  Sepsis Labs: @LABRCNTIP (procalcitonin:4,lacticidven:4) )No results found for this or any previous visit (from the past 240 hour(s)).   Radiological Exams on Admission: Dg Chest 2 View  Result Date: 08/16/2016 CLINICAL DATA:  Acute onset of generalized chest pain. Initial encounter. EXAM: CHEST  2 VIEW COMPARISON:  None. FINDINGS: The lungs are well-aerated and clear. There is no evidence of focal opacification, pleural effusion or pneumothorax. The heart is normal in size; the mediastinal contour is within normal limits. No acute osseous abnormalities are seen. IMPRESSION: No acute cardiopulmonary process seen. Electronically Signed   By: Roanna Raider M.D.   On:  08/16/2016 06:14    EKG: Independently reviewed.  Assessment/Plan Active Problems:   Unstable angina (HCC)   Tobacco abuse   Anti-phospholipid syndrome (HCC)   Hyperlipidemia   Essential hypertension, benign   COPD (chronic obstructive pulmonary disease) (HCC)   Unstable Angina known CAD s/p 7 stents. [facility] from [facility] pending   HEART score 5-6 . Troponin 0 ,  EKG with sinus rhythm, ventricular premature complex, right bundle branch block, QTC 468  CP relieved by nitroglycerin, morphine, aspirin. CXR unrevealing.  Currently on Nitro drip and Hep drip   Admit to Telemetry/ Observation Chest pain order set Cycle troponins EKG in am continue ASA, O2 and NTG   GI cocktail Check Lipid panel  Hb A1C Consult to Cards, patient will be kept NPO with sips for meds in case that cardiac catheterization is indicated.    COPD without exacerbation Osats normal CXR NAD  WBC  14.8 in the setting of inflammation, no indication of infectious proces  Continue inhaler  and O2 prn  CBC in am    Hypertension BP (!) 114/57   Pulse 83   Temp 98 F (36.7 C) (Oral)   Resp (!) 25   Ht 5\' 7"  (1.702 m)   Wt 104.3 kg (230 lb)   SpO2 97%   BMI 36.02 kg/m  Controlled Continue home anti-hypertensive medications Appreciate Cards evaluation      Hyperlipidemia Continue home statins    Tobacco abuse with nicotine withdrawal  Nicotine patch vs gum after cath   Counseled cessation  Antiphospholipid Syndrome Continue Coumadin as per Pharmacy tomorrow, as patient is on Heparin at this time  .  DVT prophylaxis:  Heparin per Pharmacy, may resume Coumadin tomorrow per Pharmacy  Code Status:   Full    Family Communication:  Discussed with patient Disposition Plan: Expect patient to be discharged to home after condition improves Consults called:    Cardiology per EDP  Admission status:Tele Inpatient    [facility] E, PA-C [facility]   08/16/2016, 8:45 AM

## 2016-08-16 NOTE — Progress Notes (Addendum)
ANTICOAGULATION CONSULT NOTE - Initial Consult  Pharmacy Consult for heparin and warfarin Indication: chest pain/ACS and ?  No Known Allergies  Patient Measurements: Height: 5\' 7"  (170.2 cm) Weight: 230 lb (104.3 kg) IBW/kg (Calculated) : 66.1 Heparin Dosing Weight: 90kg  Vital Signs: Temp: 98 F (36.7 C) (06/03 0506) Temp Source: Oral (06/03 0506) BP: 133/70 (06/03 0506) Pulse Rate: 92 (06/03 0506)  Labs:  Recent Labs  08/16/16 0507  HGB 13.7  HCT 42.5  PLT 241  LABPROT 20.9*  INR 1.78  CREATININE 0.70    Estimated Creatinine Clearance: 111.6 mL/min (by C-G formula based on SCr of 0.7 mg/dL).   Medical History: Past Medical History:  Diagnosis Date  . COPD (chronic obstructive pulmonary disease) (HCC)   . Coronary artery disease   . Hypertension     Assessment: 61yo male c/o CP, took NTG and ASA at home, istat troponin negative, to begin heparin; of note pt is on Coumadin PTA (indication unclear), current INR <2, last dose taken 6/2.  Goal of Therapy:  Heparin level 0.3-0.7 units/ml Monitor platelets by anticoagulation protocol: Yes   Plan:  Will start heparin gtt at 1250 units/hr (no bolus 2/2 INR) and monitor heparin levels and CBC.  Will give boosted Coumadin dose of 8mg  po x1 this evening and monitor INR for dose adjustments.  Vernard GamblesVeronda Hanaa Payes, PharmD, BCPS  08/16/2016,6:03 AM

## 2016-08-16 NOTE — ED Notes (Signed)
Pt presents to the ED after having chest pains that began while he was trying to fall asleep while watching television.

## 2016-08-17 DIAGNOSIS — Z720 Tobacco use: Secondary | ICD-10-CM

## 2016-08-17 DIAGNOSIS — I10 Essential (primary) hypertension: Secondary | ICD-10-CM | POA: Diagnosis not present

## 2016-08-17 DIAGNOSIS — I2510 Atherosclerotic heart disease of native coronary artery without angina pectoris: Secondary | ICD-10-CM | POA: Diagnosis not present

## 2016-08-17 DIAGNOSIS — I200 Unstable angina: Secondary | ICD-10-CM

## 2016-08-17 DIAGNOSIS — J449 Chronic obstructive pulmonary disease, unspecified: Secondary | ICD-10-CM | POA: Diagnosis not present

## 2016-08-17 DIAGNOSIS — Z7901 Long term (current) use of anticoagulants: Secondary | ICD-10-CM

## 2016-08-17 DIAGNOSIS — E119 Type 2 diabetes mellitus without complications: Secondary | ICD-10-CM | POA: Diagnosis not present

## 2016-08-17 DIAGNOSIS — Z9861 Coronary angioplasty status: Secondary | ICD-10-CM | POA: Diagnosis not present

## 2016-08-17 DIAGNOSIS — I25110 Atherosclerotic heart disease of native coronary artery with unstable angina pectoris: Secondary | ICD-10-CM | POA: Diagnosis not present

## 2016-08-17 DIAGNOSIS — I208 Other forms of angina pectoris: Secondary | ICD-10-CM | POA: Diagnosis present

## 2016-08-17 DIAGNOSIS — D6861 Antiphospholipid syndrome: Secondary | ICD-10-CM | POA: Diagnosis not present

## 2016-08-17 LAB — HEMOGLOBIN A1C
HEMOGLOBIN A1C: 5.9 % — AB (ref 4.8–5.6)
MEAN PLASMA GLUCOSE: 123 mg/dL

## 2016-08-17 LAB — CBC
HCT: 41.6 % (ref 39.0–52.0)
Hemoglobin: 13.5 g/dL (ref 13.0–17.0)
MCH: 30.3 pg (ref 26.0–34.0)
MCHC: 32.5 g/dL (ref 30.0–36.0)
MCV: 93.5 fL (ref 78.0–100.0)
PLATELETS: 205 10*3/uL (ref 150–400)
RBC: 4.45 MIL/uL (ref 4.22–5.81)
RDW: 13.8 % (ref 11.5–15.5)
WBC: 9.5 10*3/uL (ref 4.0–10.5)

## 2016-08-17 LAB — HEPARIN LEVEL (UNFRACTIONATED): Heparin Unfractionated: 0.3 IU/mL (ref 0.30–0.70)

## 2016-08-17 LAB — PROTIME-INR
INR: 1.9
PROTHROMBIN TIME: 22.1 s — AB (ref 11.4–15.2)

## 2016-08-17 LAB — HIV ANTIBODY (ROUTINE TESTING W REFLEX): HIV Screen 4th Generation wRfx: NONREACTIVE

## 2016-08-17 MED ORDER — RANOLAZINE ER 500 MG PO TB12
500.0000 mg | ORAL_TABLET | Freq: Two times a day (BID) | ORAL | Status: DC
  Administered 2016-08-17: 500 mg via ORAL
  Filled 2016-08-17: qty 1

## 2016-08-17 MED ORDER — RANOLAZINE ER 500 MG PO TB12: 500.0000 mg | ORAL_TABLET | Freq: Two times a day (BID) | ORAL | 0 refills | Status: DC

## 2016-08-17 MED ORDER — NICOTINE 21 MG/24HR TD PT24: 21.0000 mg | MEDICATED_PATCH | TRANSDERMAL | 1 refills | Status: DC

## 2016-08-17 NOTE — Progress Notes (Signed)
Progress Note  Patient Name: Jose [name] Date of Encounter: 08/17/2016  Primary Cardiologist: new (Dr. Anne [name])  Subjective   Feeling well. Denies recurrent chest pain. Ambulated without chest pain or shortness of breath.  Inpatient Medications    Scheduled Meds: . aspirin EC  81 mg Oral Daily  . clopidogrel  75 mg Oral Daily  . furosemide  20 mg Oral Daily  . isosorbide mononitrate  60 mg Oral Daily  . losartan  50 mg Oral Daily  . metoprolol succinate  100 mg Oral Daily  . pantoprazole  40 mg Oral BID  . rosuvastatin  10 mg Oral QPM  . tamsulosin  0.4 mg Oral Daily  . topiramate  50 mg Oral BID  . warfarin  5 mg Oral Once  . Warfarin - Pharmacist Dosing Inpatient   Does not apply q1800   Continuous Infusions: . heparin 1,350 Units/hr (08/17/16 0029)  . nitroGLYCERIN Stopped (08/16/16 2230)   PRN Meds: acetaminophen, albuterol, ALPRAZolam, morphine injection, nicotine polacrilex, ondansetron (ZOFRAN) IV   Vital Signs    Vitals:   08/16/16 1951 08/16/16 2330 08/17/16 0330 08/17/16 0844  BP: 120/60 118/74 111/71 (!) 126/59  Pulse: 80 70 62 71  Resp: 18 20 17    Temp: 98.6 F (37 C) 98.5 F (36.9 C) 98.2 F (36.8 C) 98.5 F (36.9 C)  TempSrc: Oral Oral Oral Oral  SpO2: 94% 95% 97% 94%  Weight:      Height:        Intake/Output Summary (Last 24 hours) at 08/17/16 0918 Last data filed at 08/17/16 8295  Gross per 24 hour  Intake          1070.21 ml  Output             2400 ml  Net         -1329.79 ml   Filed Weights   08/16/16 0600 08/16/16 0900  Weight: 104.3 kg (230 lb) 107 kg (235 lb 14.3 oz)    Telemetry    Telemetry: Sinus rhythm. No events. - Personally Reviewed  ECG    Sinus rhythm. Rate 67 bpm. Right bundle branch block.- Personally Reviewed  Physical Exam   GEN: Well-appearing. No acute distress.   Neck: No JVD Cardiac: RRR, II/VI systolic murmur at the right upper sternal border. No rubs or gallops.  Respiratory: Clear to  auscultation bilaterally. GI: Soft, nontender, non-distended  MS: No edema; No deformity. Neuro:  Nonfocal  Psych: Normal affect   Labs    Chemistry Recent Labs Lab 08/16/16 0507  NA 140  K 3.7  CL 108  CO2 28  GLUCOSE 124*  BUN 14  CREATININE 0.70  CALCIUM 8.4*  GFRNONAA >60  GFRAA >60  ANIONGAP 4*     Hematology Recent Labs Lab 08/16/16 0507 08/17/16 0300  WBC 14.8* 9.5  RBC 4.51 4.45  HGB 13.7 13.5  HCT 42.5 41.6  MCV 94.2 93.5  MCH 30.4 30.3  MCHC 32.2 32.5  RDW 13.7 13.8  PLT 241 205    Cardiac Enzymes Recent Labs Lab 08/16/16 0922 08/16/16 1309  TROPONINI <0.03 <0.03    Recent Labs Lab 08/16/16 0511  TROPIPOC 0.00     BNPNo results for input(s): BNP, PROBNP in the last 168 hours.   DDimer No results for input(s): DDIMER in the last 168 hours.   Radiology    Dg Chest 2 View  Result Date: 08/16/2016 CLINICAL DATA:  Acute onset of generalized chest pain. Initial encounter. EXAM:  CHEST  2 VIEW COMPARISON:  None. FINDINGS: The lungs are well-aerated and clear. There is no evidence of focal opacification, pleural effusion or pneumothorax. The heart is normal in size; the mediastinal contour is within normal limits. No acute osseous abnormalities are seen. IMPRESSION: No acute cardiopulmonary process seen. Electronically Signed   By: Roanna RaiderJeffery  [name] M.D.   On: 08/16/2016 06:14    Cardiac Studies   LHC 04/2015: 50% ostial LAD, occluded mCFX, 70% mRCA ISR, and 70% distal RCA. The RCA Lesions were treated with DES.  Dr Jose ConstantMoore said he felt it would be difficult to intervene on the CFX lesion.   Echo 12/06/2015: LVEF >55%.  Diastolic function not assessed.  No valvular disease.  Nuclear stress 12/12/15: LVEF 46%.  Mild inferior hypokinesis.  Medium sized defect of mild severity in the basal to mid inferior wall consistent with mild ischemia.  Diaphragm attenuation also noted.   Patient Profile     61 y.o. male with CAD [facility],  antiphospholipid syndrome on warfarin, hypertension, hyperlipidemia, OSA and moderate carotid disease  Assessment & Plan    # CAD: # Unstable angina: Cardiac enzymes negative x3.  Mr. Jose RealKrems has known, mild inferior ischemia. He reports that he has been taking Imdur prior to admission.  Continue aspirin and clopidogrel.  He would like to try stopping Imdur and try ranolazine 500 mg q12h.  He would benefit from cardiac rehabilitation but states that his insurance will not cover it. Consider outpatient referral at least for a few sessions.    # Antiphospholipid antibody syndrome:  # Prior PE: On warfarin.  Goal INR 2-3. Currently bridging with heparin.   # OSA: Difficulty wearing CPAP.   # Hypertension:  Blood pressure is well-controlled.  Continue metoprolol, losartan and furosemide.   # Moderate carotid stenosis:  50% R ICA, 50-69% L ICA stenosis 10/2015.  # Tobacco abuse:  Currently smoking 1/3 ppd.  Encouraged smoking cessation.  He had success wearing a patch in the past but isn't interested in wearing when it was time.  We spent 5 minutes discussing smoking cessation.    # Hyperlipidemia: LDL 64 this admission.  Continue rosuvastatin.   Signed, Chilton Siiffany [place], MD  08/17/2016, 9:18 AM

## 2016-08-17 NOTE — Discharge Summary (Signed)
Jose [name] QIH:[id number] DOB: 1955-04-06 DOA: 08/16/2016  PCP: [name], MD  Admit date: 08/16/2016  Discharge date: 08/17/2016  Admitted From: Home   Disposition:  Home   Recommendations for Outpatient Follow-up:   Follow up with PCP in 1-2 weeks  PCP Please obtain BMP/CBC, 2 view CXR in 1week,  (see Discharge instructions)   PCP Please follow up on the following pending results: None   Home Health: None   Equipment/Devices: None  Consultations: Cards Discharge Condition: Stable   CODE STATUS: Full   Diet Recommendation:  Heart Healthy    Chief Complaint  Patient presents with  . Chest Pain     Brief history of present illness from the day of admission and additional interim summary    Jose [name] is a 61 y.o. male who just moved to [facility] from [place], with medical history significant for HTN,CAD s/p stents x7 1 year ago at [facility], COPD, APL syndrome on Coumadin, presenting with sudden onset of jaw and Left arm and shoulder pain, similar to his prior MI, His EKG was nonacute and he ruled out for MI. Was seen by cardiology this admission as well.                                                                 Hospital Course      Chest pain. Which was suspicious for unstable angina. Patient ruled out for MI, his EKG was nonacute, troponin 3 sets unremarkable, he was initially kept on nitroglycerin and heparin drip, he has underlying history of CAD with about 7 stents placed in the past last in 2017 February, he was continued on his dual antiplatelet therapy, statin and beta blocker for secondary prevention, he is completely symptom free this morning, cardiology has seen him and have switched his Imdur to Ranexa. He has been cleared for home discharge or discussed his case with Dr. Chilton Si this morning. He will follow with his PCP and cardiologist of choice in the outpatient setting. Note he has been trying to find a new cardiologist now.  2. Obstructive sleep apnea. CPAP daily at bedtime  3. History of hyper coagulable state with antiphospholipid antibody syndrome, history of PE. Continue Coumadin INR was 1.9, requested to follow with PCP in 3 days for repeat INR.  4. Hypertension. Stable continue home regimen unchanged.  5. COPD. No acute issues continue supportive care, continues to smoke consult to quit, and echocardiogram patch provided.  6. Moderate carotid artery disease. Outpatient follow-up with PCP was secondary to his factor modulation, continue dual and type later treatment along with statin.  7. History of DM type II. Per patient diet-controlled. Follow with PCP for the same.  8. BPH. Continue Flomax.   Discharge diagnosis     Principal Problem:  Unstable angina (HCC) Active Problems:   Tobacco abuse   Anti-phospholipid syndrome (HCC)   Hyperlipidemia   Essential hypertension, benign   COPD (chronic obstructive pulmonary disease) (HCC)   Sleep apnea   CAD S/P multiple PCIs   History of pulmonary embolus (PE)-2011   Chronic anticoagulation-Couamdin   Non-insulin treated type 2 diabetes mellitus (HCC)   Carotid artery disease (HCC)   RBBB (right bundle branch block)    Discharge instructions    Discharge Instructions    Diet - low sodium heart healthy    Complete by:  As directed    Discharge instructions    Complete by:  As directed    Follow with Primary MD Ephriam JenkinsFowler, Walter E, MD in 3 days   Get CBC, CMP, INR,  checked  by Primary MD  in 3 days ( we routinely change or add medications that can affect your baseline labs and fluid status, therefore we recommend that you get the mentioned basic workup next visit with your PCP, your PCP may decide not to get them or add new tests based on their clinical decision)  Activity: As tolerated  with Full fall precautions use walker/cane & assistance as needed  Disposition Home    Diet:   Heart Healthy   For Heart failure patients - Check your Weight same time everyday, if you gain over 2 pounds, or you develop in leg swelling, experience more shortness of breath or chest pain, call your Primary MD immediately. Follow Cardiac Low Salt Diet and 1.5 lit/day fluid restriction.  On your next visit with your primary care physician please Get Medicines reviewed and adjusted.  Please request your Prim.MD to go over all Hospital Tests and Procedure/Radiological results at the follow up, please get all Hospital records sent to your Prim MD by signing hospital release before you go home.  If you experience worsening of your admission symptoms, develop shortness of breath, life threatening emergency, suicidal or homicidal thoughts you must seek medical attention immediately by calling 911 or calling your MD immediately  if symptoms less severe.  You Must read complete instructions/literature along with all the possible adverse reactions/side effects for all the Medicines you take and that have been prescribed to you. Take any new Medicines after you have completely understood and accpet all the possible adverse reactions/side effects.   Do not drive, operate heavy machinery, perform activities at heights, swimming or participation in water activities or provide baby sitting services if your were admitted for syncope or siezures until you have seen by Primary MD or a Neurologist and advised to do so again.  Do not drive when taking Pain medications.    Do not take more than prescribed Pain, Sleep and Anxiety Medications  Special Instructions: If you have smoked or chewed Tobacco  in the last 2 yrs please stop smoking, stop any regular Alcohol  and or any Recreational drug use.  Wear Seat belts while driving.   Please note  You were cared for by a hospitalist during your hospital stay. If  you have any questions about your discharge medications or the care you received while you were in the hospital after you are discharged, you can call the unit and asked to speak with the hospitalist on call if the hospitalist that took care of you is not available. Once you are discharged, your primary care physician will handle any further medical issues. Please note that NO REFILLS for any discharge medications will be authorized once you are  discharged, as it is imperative that you return to your primary care physician (or establish a relationship with a primary care physician if you do not have one) for your aftercare needs so that they can reassess your need for medications and monitor your lab values.   Increase activity slowly    Complete by:  As directed       Discharge Medications   Allergies as of 08/17/2016   No Known Allergies     Medication List    STOP taking these medications   isosorbide mononitrate 60 MG 24 hr tablet Commonly known as:  IMDUR     TAKE these medications   aspirin EC 81 MG tablet Take 81 mg by mouth daily.   clopidogrel 75 MG tablet Commonly known as:  PLAVIX Take 75 mg by mouth daily.   furosemide 20 MG tablet Commonly known as:  LASIX Take 20 mg by mouth daily.   losartan 50 MG tablet Commonly known as:  COZAAR Take 50 mg by mouth daily.   metoprolol succinate 100 MG 24 hr tablet Commonly known as:  TOPROL-XL Take 100 mg by mouth daily.   nicotine 21 mg/24hr patch Commonly known as:  NICODERM CQ - dosed in mg/24 hours Place 1 patch (21 mg total) onto the skin daily.   nitroGLYCERIN 0.4 MG SL tablet Commonly known as:  NITROSTAT Place 0.4 mg under the tongue every 5 (five) minutes as needed for chest pain.   pantoprazole 40 MG tablet Commonly known as:  PROTONIX Take 40 mg by mouth 2 (two) times daily.   PROAIR HFA 108 (90 Base) MCG/ACT inhaler Generic drug:  albuterol Inhale 1-2 puffs into the lungs every 4 (four) hours as needed  for wheezing.   ranolazine 500 MG 12 hr tablet Commonly known as:  RANEXA Take 1 tablet (500 mg total) by mouth 2 (two) times daily.   rosuvastatin 10 MG tablet Commonly known as:  CRESTOR Take 10 mg by mouth every evening.   tamsulosin 0.4 MG Caps capsule Commonly known as:  FLOMAX Take 0.4 mg by mouth daily.   topiramate 50 MG tablet Commonly known as:  TOPAMAX Take 50 mg by mouth 2 (two) times daily.   warfarin 5 MG tablet Commonly known as:  COUMADIN Take 5-7.5 mg by mouth See admin instructions. Take 7.5 mg on Monday, Wednesday and Friday then take 5 mg all the other days       Follow-up Information    [name], MD. Schedule an appointment as soon as possible for a visit in 1 week(s).   Specialty:  Internal Medicine Contact information: [address] [place] [address] [phone]        Jake Bathe, MD. Schedule an appointment as soon as possible for a visit in 1 week(s).   Specialty:  Cardiology Contact information: [address] [place] [address] [phone]           Major procedures and Radiology Reports - PLEASE review detailed and final reports thoroughly  -        Dg Chest 2 View  Result Date: 08/16/2016 CLINICAL DATA:  Acute onset of generalized chest pain. Initial encounter. EXAM: CHEST  2 VIEW COMPARISON:  None. FINDINGS: The lungs are well-aerated and clear. There is no evidence of focal opacification, pleural effusion or pneumothorax. The heart is normal in size; the mediastinal contour is within normal limits. No acute osseous abnormalities are seen. IMPRESSION: No acute cardiopulmonary process  seen. Electronically Signed   By: Roanna Raider M.D.   On: 08/16/2016 06:14    Micro Results     Recent Results (from the past 240 hour(s))  MRSA PCR Screening     Status: None   Collection Time: 08/16/16  8:10 AM  Result Value Ref Range Status   MRSA by PCR NEGATIVE NEGATIVE Final    Comment:         The GeneXpert MRSA Assay (FDA approved for NASAL specimens only), is one component of a comprehensive MRSA colonization surveillance program. It is not intended to diagnose MRSA infection nor to guide or monitor treatment for MRSA infections.     Today   Subjective    Jose [name] today has no headache,no chest abdominal pain,no new weakness tingling or numbness, feels much better wants to go home today.    Objective   Blood pressure (!) 126/59, pulse 71, temperature 98.5 F (36.9 C), temperature source Oral, resp. rate 17, height 5\' 7"  (1.702 m), weight 107 kg (235 lb 14.3 oz), SpO2 94 %.   Intake/Output Summary (Last 24 hours) at 08/17/16 1013 Last data filed at 08/17/16 1610  Gross per 24 hour  Intake          1053.21 ml  Output             2400 ml  Net         -1346.79 ml    Exam Awake Alert, Oriented x 3, No new F.N deficits, Normal affect [place].AT,PERRAL Supple Neck,No JVD, No cervical lymphadenopathy appriciated.  Symmetrical Chest wall movement, Good air movement bilaterally, CTAB RRR,No Gallops,Rubs or new Murmurs, No Parasternal Heave +ve B.Sounds, Abd Soft, Non tender, No organomegaly appriciated, No rebound -guarding or rigidity. No Cyanosis, Clubbing or edema, No new Rash or bruise   Data Review   CBC w Diff: Lab Results  Component Value Date   WBC 9.5 08/17/2016   HGB 13.5 08/17/2016   HCT 41.6 08/17/2016   PLT 205 08/17/2016    CMP: Lab Results  Component Value Date   NA 140 08/16/2016   K 3.7 08/16/2016   CL 108 08/16/2016   CO2 28 08/16/2016   BUN 14 08/16/2016   CREATININE 0.70 08/16/2016  . Lab Results  Component Value Date   INR 1.90 08/17/2016   INR 1.78 08/16/2016     Total Time in preparing paper work, data evaluation and todays exam - 35 minutes  Susa Raring M.D on 08/17/2016 at 10:13 AM  [facility]   Office  [phone]

## 2016-08-17 NOTE — Care Management Note (Signed)
Case Management Note  Patient Details  Name: Jose [name] MRN: [id number] Date of Birth: 02/15/1956  Subjective/Objective:                    Action/Plan:  Consult for DME. Spoke with patient at bedside. He has CPAP and concentrator at home already. He does not need walker, 3 in1  Expected Discharge Date:  08/17/16               Expected Discharge Plan:  Home/Self Care  In-House Referral:     Discharge planning Services  CM Consult  Post Acute Care Choice:  Durable Medical Equipment Choice offered to:  Patient  DME Arranged:    DME Agency:     HH Arranged:    HH Agency:     Status of Service:  Completed, signed off  If discussed at [employer] Meetings, dates discussed:    Additional Comments:  Kingsley PlanWile, [name], RN 08/17/2016, 12:27 PM

## 2016-08-17 NOTE — Discharge Instructions (Signed)
Follow with Primary MD Ephriam JenkinsFowler, Walter E, MD in 3 days   Get CBC, CMP, INR,  checked  by Primary MD  in 3 days ( we routinely change or add medications that can affect your baseline labs and fluid status, therefore we recommend that you get the mentioned basic workup next visit with your PCP, your PCP may decide not to get them or add new tests based on their clinical decision)  Activity: As tolerated with Full fall precautions use walker/cane & assistance as needed  Disposition Home    Diet:   Heart Healthy   For Heart failure patients - Check your Weight same time everyday, if you gain over 2 pounds, or you develop in leg swelling, experience more shortness of breath or chest pain, call your Primary MD immediately. Follow Cardiac Low Salt Diet and 1.5 lit/day fluid restriction.  On your next visit with your primary care physician please Get Medicines reviewed and adjusted.  Please request your Prim.MD to go over all Hospital Tests and Procedure/Radiological results at the follow up, please get all Hospital records sent to your Prim MD by signing hospital release before you go home.  If you experience worsening of your admission symptoms, develop shortness of breath, life threatening emergency, suicidal or homicidal thoughts you must seek medical attention immediately by calling 911 or calling your MD immediately  if symptoms less severe.  You Must read complete instructions/literature along with all the possible adverse reactions/side effects for all the Medicines you take and that have been prescribed to you. Take any new Medicines after you have completely understood and accpet all the possible adverse reactions/side effects.   Do not drive, operate heavy machinery, perform activities at heights, swimming or participation in water activities or provide baby sitting services if your were admitted for syncope or siezures until you have seen by Primary MD or a Neurologist and advised to do so  again.  Do not drive when taking Pain medications.    Do not take more than prescribed Pain, Sleep and Anxiety Medications  Special Instructions: If you have smoked or chewed Tobacco  in the last 2 yrs please stop smoking, stop any regular Alcohol  and or any Recreational drug use.  Wear Seat belts while driving.   Please note  You were cared for by a hospitalist during your hospital stay. If you have any questions about your discharge medications or the care you received while you were in the hospital after you are discharged, you can call the unit and asked to speak with the hospitalist on call if the hospitalist that took care of you is not available. Once you are discharged, your primary care physician will handle any further medical issues. Please note that NO REFILLS for any discharge medications will be authorized once you are discharged, as it is imperative that you return to your primary care physician (or establish a relationship with a primary care physician if you do not have one) for your aftercare needs so that they can reassess your need for medications and monitor your lab values.

## 2016-08-18 ENCOUNTER — Encounter: Payer: Self-pay | Admitting: Cardiology

## 2016-08-25 ENCOUNTER — Ambulatory Visit: Payer: BLUE CROSS/BLUE SHIELD | Admitting: Cardiology

## 2017-02-10 ENCOUNTER — Inpatient Hospital Stay (HOSPITAL_COMMUNITY)
Admission: EM | Admit: 2017-02-10 | Discharge: 2017-02-12 | DRG: 314 | Disposition: A | Discharge status: Home / Self Care | Payer: BLUE CROSS/BLUE SHIELD | Attending: Family Medicine | Admitting: Family Medicine

## 2017-02-10 ENCOUNTER — Other Ambulatory Visit: Payer: Self-pay

## 2017-02-10 ENCOUNTER — Emergency Department (HOSPITAL_COMMUNITY): Payer: BLUE CROSS/BLUE SHIELD

## 2017-02-10 ENCOUNTER — Encounter (HOSPITAL_COMMUNITY): Payer: Self-pay | Admitting: Emergency Medicine

## 2017-02-10 DIAGNOSIS — Z79899 Other long term (current) drug therapy: Secondary | ICD-10-CM

## 2017-02-10 DIAGNOSIS — Z6836 Body mass index (BMI) 36.0-36.9, adult: Secondary | ICD-10-CM

## 2017-02-10 DIAGNOSIS — Z9861 Coronary angioplasty status: Secondary | ICD-10-CM

## 2017-02-10 DIAGNOSIS — I10 Essential (primary) hypertension: Secondary | ICD-10-CM | POA: Diagnosis present

## 2017-02-10 DIAGNOSIS — I5033 Acute on chronic diastolic (congestive) heart failure: Secondary | ICD-10-CM | POA: Diagnosis present

## 2017-02-10 DIAGNOSIS — I301 Infective pericarditis: Secondary | ICD-10-CM | POA: Diagnosis present

## 2017-02-10 DIAGNOSIS — I3139 Other pericardial effusion (noninflammatory): Secondary | ICD-10-CM

## 2017-02-10 DIAGNOSIS — I25118 Atherosclerotic heart disease of native coronary artery with other forms of angina pectoris: Secondary | ICD-10-CM | POA: Diagnosis present

## 2017-02-10 DIAGNOSIS — I2510 Atherosclerotic heart disease of native coronary artery without angina pectoris: Secondary | ICD-10-CM

## 2017-02-10 DIAGNOSIS — I110 Hypertensive heart disease with heart failure: Secondary | ICD-10-CM | POA: Diagnosis present

## 2017-02-10 DIAGNOSIS — F17210 Nicotine dependence, cigarettes, uncomplicated: Secondary | ICD-10-CM | POA: Diagnosis present

## 2017-02-10 DIAGNOSIS — Z832 Family history of diseases of the blood and blood-forming organs and certain disorders involving the immune mechanism: Secondary | ICD-10-CM

## 2017-02-10 DIAGNOSIS — Z8249 Family history of ischemic heart disease and other diseases of the circulatory system: Secondary | ICD-10-CM

## 2017-02-10 DIAGNOSIS — Z955 Presence of coronary angioplasty implant and graft: Secondary | ICD-10-CM

## 2017-02-10 DIAGNOSIS — I313 Pericardial effusion (noninflammatory): Principal | ICD-10-CM | POA: Diagnosis present

## 2017-02-10 DIAGNOSIS — I5082 Biventricular heart failure: Secondary | ICD-10-CM | POA: Diagnosis present

## 2017-02-10 DIAGNOSIS — I319 Disease of pericardium, unspecified: Secondary | ICD-10-CM | POA: Diagnosis present

## 2017-02-10 DIAGNOSIS — Z9111 Patient's noncompliance with dietary regimen: Secondary | ICD-10-CM

## 2017-02-10 DIAGNOSIS — J440 Chronic obstructive pulmonary disease with acute lower respiratory infection: Secondary | ICD-10-CM | POA: Diagnosis present

## 2017-02-10 DIAGNOSIS — I959 Hypotension, unspecified: Secondary | ICD-10-CM | POA: Diagnosis present

## 2017-02-10 DIAGNOSIS — G4733 Obstructive sleep apnea (adult) (pediatric): Secondary | ICD-10-CM | POA: Diagnosis present

## 2017-02-10 DIAGNOSIS — Z7982 Long term (current) use of aspirin: Secondary | ICD-10-CM

## 2017-02-10 DIAGNOSIS — D6861 Antiphospholipid syndrome: Secondary | ICD-10-CM | POA: Diagnosis present

## 2017-02-10 DIAGNOSIS — D6869 Other thrombophilia: Secondary | ICD-10-CM | POA: Diagnosis present

## 2017-02-10 DIAGNOSIS — R079 Chest pain, unspecified: Secondary | ICD-10-CM | POA: Diagnosis not present

## 2017-02-10 DIAGNOSIS — K5900 Constipation, unspecified: Secondary | ICD-10-CM | POA: Diagnosis present

## 2017-02-10 DIAGNOSIS — Z9981 Dependence on supplemental oxygen: Secondary | ICD-10-CM

## 2017-02-10 DIAGNOSIS — I4510 Unspecified right bundle-branch block: Secondary | ICD-10-CM | POA: Diagnosis present

## 2017-02-10 DIAGNOSIS — I700 Atherosclerosis of aorta: Secondary | ICD-10-CM | POA: Diagnosis present

## 2017-02-10 DIAGNOSIS — I252 Old myocardial infarction: Secondary | ICD-10-CM

## 2017-02-10 DIAGNOSIS — Z7901 Long term (current) use of anticoagulants: Secondary | ICD-10-CM

## 2017-02-10 DIAGNOSIS — Z86711 Personal history of pulmonary embolism: Secondary | ICD-10-CM

## 2017-02-10 DIAGNOSIS — E119 Type 2 diabetes mellitus without complications: Secondary | ICD-10-CM | POA: Diagnosis present

## 2017-02-10 DIAGNOSIS — I2781 Cor pulmonale (chronic): Secondary | ICD-10-CM | POA: Diagnosis present

## 2017-02-10 DIAGNOSIS — Z9049 Acquired absence of other specified parts of digestive tract: Secondary | ICD-10-CM

## 2017-02-10 DIAGNOSIS — E785 Hyperlipidemia, unspecified: Secondary | ICD-10-CM | POA: Diagnosis present

## 2017-02-10 DIAGNOSIS — J9811 Atelectasis: Secondary | ICD-10-CM | POA: Diagnosis present

## 2017-02-10 DIAGNOSIS — E669 Obesity, unspecified: Secondary | ICD-10-CM | POA: Diagnosis present

## 2017-02-10 DIAGNOSIS — J219 Acute bronchiolitis, unspecified: Secondary | ICD-10-CM

## 2017-02-10 DIAGNOSIS — I50813 Acute on chronic right heart failure: Secondary | ICD-10-CM | POA: Diagnosis present

## 2017-02-10 LAB — BASIC METABOLIC PANEL
Anion gap: 9 (ref 5–15)
BUN: 18 mg/dL (ref 6–20)
CALCIUM: 8.5 mg/dL — AB (ref 8.9–10.3)
CHLORIDE: 102 mmol/L (ref 101–111)
CO2: 21 mmol/L — AB (ref 22–32)
CREATININE: 0.88 mg/dL (ref 0.61–1.24)
GFR calc non Af Amer: >60 mL/min (ref >60)
GLUCOSE: 109 mg/dL — AB (ref 65–99)
Potassium: 4 mmol/L (ref 3.5–5.1)
Sodium: 132 mmol/L — ABNORMAL LOW (ref 135–145)

## 2017-02-10 LAB — CBC
HCT: 44.6 % (ref 39.0–52.0)
HEMOGLOBIN: 14.9 g/dL (ref 13.0–17.0)
MCH: 30.1 pg (ref 26.0–34.0)
MCHC: 33.4 g/dL (ref 30.0–36.0)
MCV: 90.1 fL (ref 78.0–100.0)
PLATELETS: 215 10*3/uL (ref 150–400)
RBC: 4.95 MIL/uL (ref 4.22–5.81)
RDW: 14.4 % (ref 11.5–15.5)
WBC: 13.1 10*3/uL — ABNORMAL HIGH (ref 4.0–10.5)

## 2017-02-10 LAB — BRAIN NATRIURETIC PEPTIDE: B Natriuretic Peptide: 14.6 pg/mL (ref 0.0–100.0)

## 2017-02-10 LAB — I-STAT TROPONIN, ED: Troponin i, poc: 0 ng/mL (ref 0.00–0.08)

## 2017-02-10 LAB — PROTIME-INR
INR: 2.08
Prothrombin Time: 23.2 seconds — ABNORMAL HIGH (ref 11.4–15.2)

## 2017-02-10 LAB — D-DIMER, QUANTITATIVE (NOT AT ARMC): D DIMER QUANT: 1.04 ug{FEU}/mL — AB (ref 0.00–0.50)

## 2017-02-10 MED ORDER — ONDANSETRON HCL 4 MG/2ML IJ SOLN
4.0000 mg | Freq: Once | INTRAMUSCULAR | Status: AC
  Administered 2017-02-10: 4 mg via INTRAVENOUS
  Filled 2017-02-10: qty 2

## 2017-02-10 MED ORDER — MORPHINE SULFATE (PF) 4 MG/ML IV SOLN
4.0000 mg | Freq: Once | INTRAVENOUS | Status: AC
  Administered 2017-02-10: 4 mg via INTRAVENOUS
  Filled 2017-02-10: qty 1

## 2017-02-10 MED ORDER — IOPAMIDOL (ISOVUE-370) INJECTION 76%
INTRAVENOUS | Status: AC
  Administered 2017-02-10: 100 mL
  Filled 2017-02-10: qty 100

## 2017-02-10 MED ORDER — FENTANYL CITRATE (PF) 100 MCG/2ML IJ SOLN
25.0000 ug | Freq: Once | INTRAMUSCULAR | Status: AC
  Administered 2017-02-10: 25 ug via INTRAVENOUS
  Filled 2017-02-10: qty 2

## 2017-02-10 NOTE — ED Notes (Signed)
Patient transported to CT 

## 2017-02-10 NOTE — ED Notes (Signed)
ED Provider at bedside. 

## 2017-02-10 NOTE — ED Triage Notes (Signed)
Patient is from home, significant cardiac history, having sharp pain across his chest and radiates up his neck on both sides.  Diagnosed with COPD last year.  Patient does have a RBBB.  Since 7a, patient has taken 7-8 SL nitros which has not helped.  He has been using his inhaler which has not helped at this time.  Patient is CAOx4, new to area and has not established any cardiologist or PCP.

## 2017-02-10 NOTE — ED Notes (Signed)
IV team at bedside 

## 2017-02-10 NOTE — ED Provider Notes (Signed)
[facility] Provider Note   CSN: [id number] Arrival date & time: 02/10/17  1918     History   Chief Complaint Chief Complaint  Patient presents with  . Chest Pain    HPI Jose [name] is a 61 y.o. male.  Patient is a 61 year old male with multiple medical problems including coronary artery disease status post numerous stents patient thinks about 8, antiphospholipid syndrome in addition to DVT and PE on Coumadin, diabetes, chronic tobacco abuse and COPD presenting today with multiple symptoms.  Patient states over the last 2 weeks he has had worsening shortness of breath most notable with exertion.  He states now he cannot walk more than 25 feet without getting uncomfortable.  Patient takes a long-acting 24-hour nitroglycerin as well as Imdur.  Patient will frequently get discomfort in his jaw and his left arm which he said he had intermittently over the last 2 weeks but is also had today.  Also today he noted worsening shortness of breath and a new sharp pleuritic type chest pain across his chest that is worse with deep breathing.  He has tried numerous nitroglycerin tablets today which has not helped his symptoms as well as his inhaler which is also not helped.  Patient has chronic swelling in his legs but has noted possibly worsening swelling over the last few weeks as well as weight gain.  He is currently taking 20 mg of Lasix daily because he states if he takes any more he has to go to the bathroom too much.  He denies any fever, abdominal pain, nausea or vomiting.  He has not had a productive cough.  Patient states for the last multiple months he has been having low blood pressures typically between 80 and 100s systolic at home.   The history is provided by the patient.  Chest Pain   This is a new problem. The current episode started 6 to 12 hours ago. The problem occurs constantly. The problem has not changed since onset.The pain is associated  with breathing. The pain is present in the substernal region. The pain is at a severity of 7/10. The pain is severe. The quality of the pain is described as pleuritic, sharp and stabbing. Radiates to: pt states jaw and arm pain intermittently which is chronic. The symptoms are aggravated by deep breathing. Associated symptoms include lower extremity edema and shortness of breath. Pertinent negatives include no abdominal pain, no cough, no diaphoresis, no fever, no nausea and no sputum production. He has tried nitroglycerin (albuterol) for the symptoms. The treatment provided no relief.    Past Medical History:  Diagnosis Date  . Antiphospholipid antibody syndrome (HCC)   . CAD S/P percutaneous coronary angioplasty '98, '04, Feb 2017   [facility]  . Carotid arterial disease (HCC) 10/2015   moderate bilateral  . COPD (chronic obstructive pulmonary disease) (HCC)   . Coronary artery disease   . Hypertension   . Non-insulin treated type 2 diabetes mellitus (HCC)   . Pulmonary emboli (HCC) 2011  . RBBB   . Sleep apnea     Patient Active Problem List   Diagnosis Date Noted  . Angina at rest [facility](HCC) 08/17/2016  . Unstable angina (HCC) 08/16/2016  . Tobacco abuse 08/16/2016  . Anti-phospholipid syndrome (HCC) 08/16/2016  . Hyperlipidemia 08/16/2016  . Essential hypertension, benign 08/16/2016  . COPD (chronic obstructive pulmonary disease) (HCC) 08/16/2016  . Sleep apnea 08/16/2016  . CAD S/P multiple PCIs 08/16/2016  . History of  pulmonary embolus (PE)-2011 08/16/2016  . Chronic anticoagulation-Couamdin 08/16/2016  . Non-insulin treated type 2 diabetes mellitus (HCC) 08/16/2016  . Carotid artery disease (HCC) 08/16/2016  . RBBB (right bundle branch block) 08/16/2016    Past Surgical History:  Procedure Laterality Date  . APPENDECTOMY    . BACK SURGERY  '89, '06  . COLONOSCOPY    . CORONARY ANGIOPLASTY WITH STENT PLACEMENT  '98, '04, Feb 2017       Home Medications    Prior to  Admission medications   Medication Sig Start Date End Date Taking? Authorizing Provider  aspirin EC 81 MG tablet Take 81 mg by mouth daily.    [provider]  clopidogrel (PLAVIX) 75 MG tablet Take 75 mg by mouth daily. 07/17/16   [provider]  furosemide (LASIX) 20 MG tablet Take 20 mg by mouth daily. 07/13/16   [provider]  losartan (COZAAR) 50 MG tablet Take 50 mg by mouth daily. 05/25/16   [provider]  metoprolol succinate (TOPROL-XL) 100 MG 24 hr tablet Take 100 mg by mouth daily. 08/04/16   [provider]  nicotine (NICODERM CQ - DOSED IN MG/24 HOURS) 21 mg/24hr patch Place 1 patch (21 mg total) onto the skin daily. 08/17/16 08/17/17  Leroy Sea, MD  nitroGLYCERIN (NITROSTAT) 0.4 MG SL tablet Place 0.4 mg under the tongue every 5 (five) minutes as needed for chest pain.  07/07/16   [provider]  pantoprazole (PROTONIX) 40 MG tablet Take 40 mg by mouth 2 (two) times daily. 06/03/16   [provider]  PROAIR HFA 108 (90 Base) MCG/ACT inhaler Inhale 1-2 puffs into the lungs every 4 (four) hours as needed for wheezing.  07/07/16   [provider]  ranolazine (RANEXA) 500 MG 12 hr tablet Take 1 tablet (500 mg total) by mouth 2 (two) times daily. 08/17/16   Leroy Sea, MD  rosuvastatin (CRESTOR) 10 MG tablet Take 10 mg by mouth every evening. 07/07/16   [provider]  tamsulosin (FLOMAX) 0.4 MG CAPS capsule Take 0.4 mg by mouth daily. 06/05/16   [provider]  topiramate (TOPAMAX) 50 MG tablet Take 50 mg by mouth 2 (two) times daily. 06/05/16   [provider]  warfarin (COUMADIN) 5 MG tablet Take 5-7.5 mg by mouth See admin instructions. Take 7.5 mg on Monday, Wednesday and Friday then take 5 mg all the other days 07/17/16   [provider]    Family History Family History  Problem Relation Age of Onset  . CAD Father 17    Social History Social History   Tobacco Use    . Smoking status: Current Every Day Smoker    Packs/day: 0.50    Types: Cigarettes  . Smokeless tobacco: Never Used  Substance Use Topics  . Alcohol use: No  . Drug use: No     Allergies   Patient has no known allergies.   Review of Systems Review of Systems  Constitutional: Negative for diaphoresis and fever.  Respiratory: Positive for shortness of breath. Negative for cough and sputum production.   Cardiovascular: Positive for chest pain.  Gastrointestinal: Negative for abdominal pain and nausea.  All other systems reviewed and are negative.    Physical Exam Updated Vital Signs BP (!) 96/59   Pulse 93   Temp 98.7 F (37.1 C) (Oral)   Resp (!) 23   SpO2 98%   Physical Exam  Constitutional: He is oriented to person, place, and  time. He appears well-developed and well-nourished. He appears ill. No distress.  HENT:  Head: Normocephalic and atraumatic.  Mouth/Throat: Oropharynx is clear and moist.  Eyes: Conjunctivae and EOM are normal. Pupils are equal, round, and reactive to light.  Neck: Normal range of motion. Neck supple.  Cardiovascular: Normal rate, regular rhythm and intact distal pulses.  Murmur heard.  Systolic murmur is present with a grade of 3/6. Pulmonary/Chest: Effort normal. No accessory muscle usage. Tachypnea noted. No respiratory distress. He has no wheezes. He has rales in the right lower field and the left lower field.  Abdominal: Soft. He exhibits no distension. There is no tenderness. There is no rebound and no guarding.  Musculoskeletal: Normal range of motion. He exhibits no tenderness.       Right lower leg: He exhibits edema.       Left lower leg: He exhibits edema.  3+ pitting edema up to the knee bilaterally  Neurological: He is alert and oriented to person, place, and time.  Skin: Skin is warm and dry. No rash noted. No erythema.  Psychiatric: He has a normal mood and affect. His behavior is normal.  Nursing note and vitals  reviewed.    ED Treatments / Results  Labs (all labs ordered are listed, but only abnormal results are displayed) Labs Reviewed  BASIC METABOLIC PANEL - Abnormal; Notable for the following components:      Result Value   Sodium 132 (*)    CO2 21 (*)    Glucose, Bld 109 (*)    Calcium 8.5 (*)    All other components within normal limits  CBC - Abnormal; Notable for the following components:   WBC 13.1 (*)    All other components within normal limits  PROTIME-INR - Abnormal; Notable for the following components:   Prothrombin Time 23.2 (*)    All other components within normal limits  D-DIMER, QUANTITATIVE (NOT AT [facility]) - Abnormal; Notable for the following components:   D-Dimer, Quant 1.04 (*)    All other components within normal limits  BRAIN NATRIURETIC PEPTIDE  I-STAT TROPONIN, ED    EKG  EKG Interpretation  Date/Time:  Wednesday February 10 2017 19:28:21 EST Ventricular Rate:  94 PR Interval:    QRS Duration: 141 QT Interval:  383 QTC Calculation: 479 R Axis:   85 Text Interpretation:  Sinus rhythm Right bundle branch block Lateral leads are also involved No significant change since last tracing Confirmed by Gwyneth Sprout ([id number]) on 02/10/2017 7:33:42 PM       Radiology Ct Angio Chest Pe W And/or Wo Contrast  Result Date: 02/10/2017 CLINICAL DATA:  Sharp chest pain radiating to the neck beginning this morning. History of pulmonary embolism, hypertension, coronary artery disease and stent and COPD. EXAM: CT ANGIOGRAPHY CHEST WITH CONTRAST TECHNIQUE: Multidetector CT imaging of the chest was performed using the standard protocol during bolus administration of intravenous contrast. Multiplanar CT image reconstructions and MIPs were obtained to evaluate the vascular anatomy. CONTRAST:  78 cc ISOVUE-370 IOPAMIDOL (ISOVUE-370) INJECTION 76% COMPARISON:  Chest radiograph February 10, 2017 at 1953 hours FINDINGS: CARDIOVASCULAR: Adequate contrast opacification of the  pulmonary artery's. Main pulmonary artery is not enlarged. No pulmonary arterial filling defects to the level of the subsegmental branches. Heart size is normal. Moderate coronary artery calcifications. Small moderate pericardial effusion measuring to 9 mm in depth. Thoracic aorta is normal course and caliber, mild calcific atherosclerosis. MEDIASTINUM/NODES: No lymphadenopathy by CT size criteria. 9 mm RIGHT peritracheal lymph node.  LUNGS/PLEURA: Tracheobronchial tree is patent, no pneumothorax. Mild bronchial wall thickening. No pleural effusions, focal consolidations, pulmonary nodules or masses. Bibasilar atelectasis/scarring. Faint tree-in-bud densities LEFT upper lobe. UPPER ABDOMEN: Included view of the abdomen is unremarkable. MUSCULOSKELETAL: Visualized soft tissues and included osseous structures are nonacute. Mild degenerative change of the thoracic spine. Review of the MIP images confirms the above findings. IMPRESSION: 1. No acute pulmonary embolism. 2. Moderate pericardial effusion. 3. Bilateral upper lobe respiratory bronchiolitis. No focal consolidation. 4. Bronchial wall thickening seen with bronchitis or reactive airway disease. Aortic Atherosclerosis (ICD10-I70.0). Electronically Signed   By: Awilda Metroourtnay  Bloomer M.D.   On: 02/10/2017 22:23   Dg Chest Port 1 View  Result Date: 02/10/2017 CLINICAL DATA:  Patient is from home, significant cardiac history, having sharp pain across his chest and radiates up his neck on both sides. Diagnosed with COPD last year. Patient does have a RBBB. Since 7a, patient has taken 7-8 SL nitro which has not helped. EXAM: PORTABLE CHEST 1 VIEW COMPARISON:  08/16/2016 FINDINGS: The heart size and mediastinal contours are within normal limits. Both lungs are clear. The visualized skeletal structures are unremarkable. IMPRESSION: No active disease. Electronically Signed   By: Norva PavlovElizabeth  Brown M.D.   On: 02/10/2017 20:20    Procedures Procedures (including critical  care time)  Medications Ordered in ED Medications  morphine 4 MG/ML injection 4 mg (not administered)  fentaNYL (SUBLIMAZE) injection 25 mcg (25 mcg Intravenous Given 02/10/17 2149)  iopamidol (ISOVUE-370) 76 % injection (100 mLs  Contrast Given 02/10/17 2200)  ondansetron (ZOFRAN) injection 4 mg (4 mg Intravenous Given 02/10/17 2149)     Initial Impression / Assessment and Plan / ED Course  I have reviewed the triage vital signs and the nursing notes.  Pertinent labs & imaging results that were available during my care of the patient were reviewed by me and considered in my medical decision making (see chart for details).    Patient with numerous medical problems presenting today with chest pain or shortness of breath.  Concern for potential PE, CHF, ACS.  She is tachypneic but chest pain is very pleuritic.  He is taking his Coumadin but states his INR is usually 2.  Patient also appears fluid overloaded and is only taking 20 mg of Lasix due to the annoyance of urinating too frequently.  Patient does have a murmur noted on exam today which he states he is not sure he has had in the past.  Pt has not had recent echo that I can find.   Oxygen saturation greater than 90% at this time, heart rate within normal limits, mild tachypnea, soft blood pressures in the mid 90s but concern for potential cardiomyopathy. D-dimer, chest x-ray, CBC, BMP, BNP, troponin, INR pending.  Patient given fentanyl for pain.  9:50 PM CXR without significant findings.  INR subtherapuetic at 2.  D-dimer elevated and mild leukocytosis of 13.  Trop neg. BNP is still pending.  Will do a CT to rule out PE.  10:54 PM Patient CT is negative for PE however shows moderate pericardial effusion and bilateral upper lobe respiratory bronchiolitis without evidence of pneumonia.  Given patient's pericardial effusion, shortness of breath pleuritic chest pain will have cardiology consult but will admit to medicine for further care.   Patient is not currently having significant infectious symptoms and will hold antibiotics at this time.  Patient is not currently wheezing so I do not feel that he needs albuterol or Atrovent.  Patient states his last  echo was a year ago but unable to get those results.  11:26 PM Cardiology will consult on the pt.  Final Clinical Impressions(s) / ED Diagnoses   Final diagnoses:  Pericardial effusion  Bronchiolitis    ED Discharge Orders    None       Gwyneth Sprout, MD 02/10/17 2327

## 2017-02-11 ENCOUNTER — Other Ambulatory Visit: Payer: Self-pay

## 2017-02-11 ENCOUNTER — Observation Stay (HOSPITAL_BASED_OUTPATIENT_CLINIC_OR_DEPARTMENT_OTHER): Payer: BLUE CROSS/BLUE SHIELD

## 2017-02-11 DIAGNOSIS — I509 Heart failure, unspecified: Secondary | ICD-10-CM

## 2017-02-11 DIAGNOSIS — I110 Hypertensive heart disease with heart failure: Secondary | ICD-10-CM | POA: Diagnosis present

## 2017-02-11 DIAGNOSIS — I50813 Acute on chronic right heart failure: Secondary | ICD-10-CM | POA: Diagnosis not present

## 2017-02-11 DIAGNOSIS — I700 Atherosclerosis of aorta: Secondary | ICD-10-CM | POA: Diagnosis present

## 2017-02-11 DIAGNOSIS — I10 Essential (primary) hypertension: Secondary | ICD-10-CM | POA: Diagnosis not present

## 2017-02-11 DIAGNOSIS — R079 Chest pain, unspecified: Secondary | ICD-10-CM | POA: Diagnosis present

## 2017-02-11 DIAGNOSIS — F17210 Nicotine dependence, cigarettes, uncomplicated: Secondary | ICD-10-CM | POA: Diagnosis present

## 2017-02-11 DIAGNOSIS — Z9861 Coronary angioplasty status: Secondary | ICD-10-CM | POA: Diagnosis not present

## 2017-02-11 DIAGNOSIS — I313 Pericardial effusion (noninflammatory): Principal | ICD-10-CM

## 2017-02-11 DIAGNOSIS — E785 Hyperlipidemia, unspecified: Secondary | ICD-10-CM | POA: Diagnosis present

## 2017-02-11 DIAGNOSIS — I959 Hypotension, unspecified: Secondary | ICD-10-CM | POA: Diagnosis present

## 2017-02-11 DIAGNOSIS — Z7982 Long term (current) use of aspirin: Secondary | ICD-10-CM | POA: Diagnosis not present

## 2017-02-11 DIAGNOSIS — G4733 Obstructive sleep apnea (adult) (pediatric): Secondary | ICD-10-CM | POA: Diagnosis present

## 2017-02-11 DIAGNOSIS — I2781 Cor pulmonale (chronic): Secondary | ICD-10-CM | POA: Diagnosis present

## 2017-02-11 DIAGNOSIS — D6861 Antiphospholipid syndrome: Secondary | ICD-10-CM

## 2017-02-11 DIAGNOSIS — J219 Acute bronchiolitis, unspecified: Secondary | ICD-10-CM

## 2017-02-11 DIAGNOSIS — K5900 Constipation, unspecified: Secondary | ICD-10-CM | POA: Diagnosis present

## 2017-02-11 DIAGNOSIS — I5033 Acute on chronic diastolic (congestive) heart failure: Secondary | ICD-10-CM | POA: Diagnosis present

## 2017-02-11 DIAGNOSIS — I2510 Atherosclerotic heart disease of native coronary artery without angina pectoris: Secondary | ICD-10-CM

## 2017-02-11 DIAGNOSIS — I25118 Atherosclerotic heart disease of native coronary artery with other forms of angina pectoris: Secondary | ICD-10-CM | POA: Diagnosis present

## 2017-02-11 DIAGNOSIS — I252 Old myocardial infarction: Secondary | ICD-10-CM | POA: Diagnosis not present

## 2017-02-11 DIAGNOSIS — D6869 Other thrombophilia: Secondary | ICD-10-CM | POA: Diagnosis present

## 2017-02-11 DIAGNOSIS — I309 Acute pericarditis, unspecified: Secondary | ICD-10-CM

## 2017-02-11 DIAGNOSIS — Z7901 Long term (current) use of anticoagulants: Secondary | ICD-10-CM | POA: Diagnosis not present

## 2017-02-11 DIAGNOSIS — E119 Type 2 diabetes mellitus without complications: Secondary | ICD-10-CM | POA: Diagnosis present

## 2017-02-11 DIAGNOSIS — I301 Infective pericarditis: Secondary | ICD-10-CM | POA: Diagnosis present

## 2017-02-11 DIAGNOSIS — I4510 Unspecified right bundle-branch block: Secondary | ICD-10-CM | POA: Diagnosis present

## 2017-02-11 DIAGNOSIS — J440 Chronic obstructive pulmonary disease with acute lower respiratory infection: Secondary | ICD-10-CM | POA: Diagnosis present

## 2017-02-11 DIAGNOSIS — E669 Obesity, unspecified: Secondary | ICD-10-CM | POA: Diagnosis present

## 2017-02-11 DIAGNOSIS — I5082 Biventricular heart failure: Secondary | ICD-10-CM | POA: Diagnosis present

## 2017-02-11 DIAGNOSIS — Z9981 Dependence on supplemental oxygen: Secondary | ICD-10-CM | POA: Diagnosis not present

## 2017-02-11 DIAGNOSIS — J9811 Atelectasis: Secondary | ICD-10-CM | POA: Diagnosis present

## 2017-02-11 LAB — CBC
HEMATOCRIT: 40.7 % (ref 39.0–52.0)
HEMOGLOBIN: 13.4 g/dL (ref 13.0–17.0)
MCH: 29.8 pg (ref 26.0–34.0)
MCHC: 32.9 g/dL (ref 30.0–36.0)
MCV: 90.4 fL (ref 78.0–100.0)
Platelets: 215 10*3/uL (ref 150–400)
RBC: 4.5 MIL/uL (ref 4.22–5.81)
RDW: 14.5 % (ref 11.5–15.5)
WBC: 12.3 10*3/uL — AB (ref 4.0–10.5)

## 2017-02-11 LAB — PROTIME-INR
INR: 2.26
PROTHROMBIN TIME: 24.8 s — AB (ref 11.4–15.2)

## 2017-02-11 LAB — ECHOCARDIOGRAM COMPLETE

## 2017-02-11 MED ORDER — SODIUM CHLORIDE 0.9% FLUSH: 3.0000 mL | INTRAVENOUS | Status: DC | PRN

## 2017-02-11 MED ORDER — WARFARIN SODIUM 7.5 MG PO TABS
7.5000 mg | ORAL_TABLET | Freq: Once | ORAL | Status: AC
  Administered 2017-02-11: 7.5 mg via ORAL
  Filled 2017-02-11: qty 1

## 2017-02-11 MED ORDER — PREDNISONE 20 MG PO TABS: 20.0000 mg | ORAL_TABLET | Freq: Every day | ORAL | Status: DC

## 2017-02-11 MED ORDER — COLCHICINE 0.6 MG PO TABS
0.6000 mg | ORAL_TABLET | Freq: Two times a day (BID) | ORAL | Status: DC
  Administered 2017-02-11 – 2017-02-12 (×2): 0.6 mg via ORAL
  Filled 2017-02-11 (×3): qty 1

## 2017-02-11 MED ORDER — PREDNISONE 50 MG PO TABS
50.0000 mg | ORAL_TABLET | Freq: Every day | ORAL | Status: DC
  Administered 2017-02-11 – 2017-02-12 (×2): 50 mg via ORAL
  Filled 2017-02-11 (×2): qty 1

## 2017-02-11 MED ORDER — METOPROLOL SUCCINATE ER 100 MG PO TB24
100.0000 mg | ORAL_TABLET | Freq: Every day | ORAL | Status: DC
  Administered 2017-02-11 – 2017-02-12 (×2): 100 mg via ORAL
  Filled 2017-02-11 (×2): qty 1

## 2017-02-11 MED ORDER — PREDNISONE 5 MG PO TABS: 5.0000 mg | ORAL_TABLET | Freq: Every day | ORAL | Status: DC

## 2017-02-11 MED ORDER — TOPIRAMATE 25 MG PO TABS
50.0000 mg | ORAL_TABLET | Freq: Two times a day (BID) | ORAL | Status: DC
  Administered 2017-02-11 – 2017-02-12 (×4): 50 mg via ORAL
  Filled 2017-02-11 (×6): qty 2

## 2017-02-11 MED ORDER — SODIUM CHLORIDE 0.9% FLUSH
3.0000 mL | Freq: Two times a day (BID) | INTRAVENOUS | Status: DC
  Administered 2017-02-11 – 2017-02-12 (×4): 3 mL via INTRAVENOUS

## 2017-02-11 MED ORDER — FUROSEMIDE 10 MG/ML IJ SOLN: 40.0000 mg | Freq: Two times a day (BID) | INTRAMUSCULAR | Status: DC

## 2017-02-11 MED ORDER — ASPIRIN EC 81 MG PO TBEC
81.0000 mg | DELAYED_RELEASE_TABLET | Freq: Every day | ORAL | Status: DC
  Administered 2017-02-11 – 2017-02-12 (×2): 81 mg via ORAL
  Filled 2017-02-11 (×2): qty 1

## 2017-02-11 MED ORDER — WARFARIN - PHARMACIST DOSING INPATIENT: Freq: Every day | Status: DC

## 2017-02-11 MED ORDER — LOSARTAN POTASSIUM 25 MG PO TABS
25.0000 mg | ORAL_TABLET | Freq: Every day | ORAL | Status: DC
  Administered 2017-02-11: 25 mg via ORAL
  Filled 2017-02-11: qty 1

## 2017-02-11 MED ORDER — PREDNISONE 10 MG PO TABS: 10.0000 mg | ORAL_TABLET | Freq: Every day | ORAL | Status: DC

## 2017-02-11 MED ORDER — ROSUVASTATIN CALCIUM 10 MG PO TABS
10.0000 mg | ORAL_TABLET | Freq: Every evening | ORAL | Status: DC
  Administered 2017-02-11: 10 mg via ORAL
  Filled 2017-02-11 (×2): qty 1

## 2017-02-11 MED ORDER — PREDNISONE 20 MG PO TABS
40.0000 mg | ORAL_TABLET | Freq: Every day | ORAL | Status: DC
  Administered 2017-02-12: 40 mg via ORAL
  Filled 2017-02-11: qty 2

## 2017-02-11 MED ORDER — FUROSEMIDE 10 MG/ML IJ SOLN
40.0000 mg | Freq: Two times a day (BID) | INTRAMUSCULAR | Status: DC
  Administered 2017-02-11 – 2017-02-12 (×2): 40 mg via INTRAVENOUS
  Filled 2017-02-11 (×2): qty 4

## 2017-02-11 MED ORDER — WARFARIN SODIUM 5 MG PO TABS
5.0000 mg | ORAL_TABLET | Freq: Once | ORAL | Status: AC
  Administered 2017-02-11: 5 mg via ORAL
  Filled 2017-02-11 (×2): qty 1

## 2017-02-11 MED ORDER — METOPROLOL SUCCINATE ER 100 MG PO TB24: 100.0000 mg | ORAL_TABLET | Freq: Every day | ORAL | Status: DC

## 2017-02-11 MED ORDER — ACETAMINOPHEN 325 MG PO TABS: 650.0000 mg | ORAL_TABLET | ORAL | Status: DC | PRN

## 2017-02-11 MED ORDER — ISOSORBIDE MONONITRATE ER 60 MG PO TB24
60.0000 mg | ORAL_TABLET | Freq: Every day | ORAL | Status: DC
  Administered 2017-02-11 – 2017-02-12 (×2): 60 mg via ORAL
  Filled 2017-02-11 (×2): qty 1

## 2017-02-11 MED ORDER — FUROSEMIDE 10 MG/ML IJ SOLN
40.0000 mg | Freq: Every day | INTRAMUSCULAR | Status: DC
  Administered 2017-02-11: 40 mg via INTRAVENOUS
  Filled 2017-02-11: qty 4

## 2017-02-11 MED ORDER — ONDANSETRON HCL 4 MG/2ML IJ SOLN: 4.0000 mg | Freq: Four times a day (QID) | INTRAMUSCULAR | Status: DC | PRN

## 2017-02-11 MED ORDER — ALBUTEROL SULFATE (2.5 MG/3ML) 0.083% IN NEBU: 2.5000 mg | INHALATION_SOLUTION | RESPIRATORY_TRACT | Status: DC | PRN

## 2017-02-11 MED ORDER — TAMSULOSIN HCL 0.4 MG PO CAPS
0.4000 mg | ORAL_CAPSULE | Freq: Every day | ORAL | Status: DC
  Administered 2017-02-11 – 2017-02-12 (×2): 0.4 mg via ORAL
  Filled 2017-02-11 (×2): qty 1

## 2017-02-11 MED ORDER — PANTOPRAZOLE SODIUM 40 MG PO TBEC
40.0000 mg | DELAYED_RELEASE_TABLET | Freq: Two times a day (BID) | ORAL | Status: DC
  Administered 2017-02-11 – 2017-02-12 (×4): 40 mg via ORAL
  Filled 2017-02-11 (×4): qty 1

## 2017-02-11 MED ORDER — POTASSIUM CHLORIDE CRYS ER 10 MEQ PO TBCR
10.0000 meq | EXTENDED_RELEASE_TABLET | Freq: Two times a day (BID) | ORAL | Status: DC
  Administered 2017-02-11 – 2017-02-12 (×3): 10 meq via ORAL
  Filled 2017-02-11 (×3): qty 1

## 2017-02-11 MED ORDER — PREDNISONE 20 MG PO TABS
30.0000 mg | ORAL_TABLET | Freq: Every day | ORAL | Status: DC
  Filled 2017-02-11: qty 1

## 2017-02-11 MED ORDER — LOSARTAN POTASSIUM 50 MG PO TABS: 50.0000 mg | ORAL_TABLET | Freq: Every day | ORAL | Status: DC

## 2017-02-11 MED ORDER — METOPROLOL SUCCINATE ER 50 MG PO TB24: 50.0000 mg | ORAL_TABLET | Freq: Every day | ORAL | Status: DC

## 2017-02-11 MED ORDER — SODIUM CHLORIDE 0.9 % IV SOLN: 250.0000 mL | INTRAVENOUS | Status: DC | PRN

## 2017-02-11 NOTE — ED Notes (Signed)
MD at bedside. 

## 2017-02-11 NOTE — ED Notes (Signed)
12 lead EKG obtained and given to Dr. Iver NestleBhagat at [facility] nurses station. Interpretation reads ST elevation, he reviews EKG and cancels stemi. Cardiology to come see and evaluate PT. PT made aware he is NPO until cardiologist clears him.

## 2017-02-11 NOTE — ED Notes (Addendum)
MD at bedside. MD has informed PT he can eat and drink. 40mg  IV Lasix ordered. Dr. Clyda HurdleBahgat made aware that same dose was given at 11am. He requests this dose be pushed back to 1500.

## 2017-02-11 NOTE — Progress Notes (Addendum)
ANTICOAGULATION CONSULT NOTE - Initial Consult  Pharmacy Consult for warfarin  Indication: hx of antiphospholipid syndrome, hx DVT/PE  No Known Allergies   Vital Signs: Temp: 98.7 F (37.1 C) (11/28 1929) Temp Source: Oral (11/28 1929) BP: 100/65 (11/29 0030) Pulse Rate: 88 (11/29 0030)  Labs: Recent Labs    02/10/17 2024  HGB 14.9  HCT 44.6  PLT 215  LABPROT 23.2*  INR 2.08  CREATININE 0.88    CrCl cannot be calculated (Unknown ideal weight.).   Medical History: Past Medical History:  Diagnosis Date  . Antiphospholipid antibody syndrome (HCC)   . CAD S/P percutaneous coronary angioplasty '98, '04, Feb 2017   [facility]  . Carotid arterial disease (HCC) 10/2015   moderate bilateral  . COPD (chronic obstructive pulmonary disease) (HCC)   . Coronary artery disease   . Hypertension   . Non-insulin treated type 2 diabetes mellitus (HCC)   . Pulmonary emboli (HCC) 2011  . RBBB   . Sleep apnea    Assessment: 61 yo male admitted with chest pain. Per notes, CT negative for PE. Pharmacy consulted to continue dosing PTA warfarin. Last dose taken on 11/27.   CBC is stable and INR on admission is therapeutic at 2.08. No overt s/s bleeding documented.   PTA warfarin dose: 7.5mg  MWF and 5mg  all other days - per patient report.   Goal of Therapy:  INR 2-3 Monitor platelets by anticoagulation protocol: Yes   Plan:  Warfarin 7.5mg  PO x1 now Daily INR and CBC q72hrs  Monitor for s/s bleeding   Einar CrowKatherine Leathie Weich, PharmD Clinical Pharmacist 02/11/17 12:40 AM

## 2017-02-11 NOTE — H&P (Signed)
History and Physical    Jose BaliJeffrey Vansickle JXB:[id number] DOB: 06/27/1955 DOA: 02/10/2017  PCP: Ephriam JenkinsFowler, Walter E, MD  Patient coming from: Home  I have personally briefly reviewed patient's [facility]  Chief Complaint: DOE  HPI: Jose [name] is a 61 y.o. male with medical history significant of CAD s/p 7 stents last in early 2017, antiphospholipid syndrome with prior PE now on chronic coumadin, HTN, COPD.  Patient presents to the ED with c/o worsening SOB on exertion for the past 2 weeks.  Now gets severe SOB with walking just 25 feet.  Has known stable angina.  Was supposed to change Imdur to Ranexa after June hospital admission; however couldn't afford Ranexa which apparently is $300 a month even after co-pay.  So he just stayed on Imdur.  He currently only takes 20mg  lasix daily instead of the prescribed 40mg , has been doing this for the past couple of weeks.  Says the 40mg  makes him use the bathroom too much.  ED Course: BPs running on the low side, however he states his SBPs typically run 80-100 over the past 1 year.  EKG shows RBBB, small-moderate pericardial effusion on CT scan.  Trop neg.  BNP is only 14.6.  Does have BLE 2-3+ pitting edema which has worsened over the past 2-3 weeks with associated weight gain.   Review of Systems: As per HPI otherwise 10 point review of systems negative.   Past Medical History:  Diagnosis Date  . Antiphospholipid antibody syndrome (HCC)   . CAD S/P percutaneous coronary angioplasty '98, '04, Feb 2017   [facility]  . Carotid arterial disease (HCC) 10/2015   moderate bilateral  . COPD (chronic obstructive pulmonary disease) (HCC)   . Coronary artery disease   . Hypertension   . Non-insulin treated type 2 diabetes mellitus (HCC)   . Pulmonary emboli (HCC) 2011  . RBBB   . Sleep apnea     Past Surgical History:  Procedure Laterality Date  . APPENDECTOMY    . BACK SURGERY  '89, '06  . COLONOSCOPY    . CORONARY  ANGIOPLASTY WITH STENT PLACEMENT  '98, '04, Feb 2017     reports that he has been smoking cigarettes.  He has been smoking about 0.50 packs per day. he has never used smokeless tobacco. He reports that he does not drink alcohol or use drugs.  No Known Allergies  Family History  Problem Relation Age of Onset  . CAD Father [id number]     Prior to Admission medications   Medication Sig Start Date End Date Taking? Authorizing Provider  aspirin EC 81 MG tablet Take 81 mg by mouth daily.   Yes [provider]  furosemide (LASIX) 20 MG tablet Take 20 mg by mouth daily. 07/13/16  Yes [provider]  losartan (COZAAR) 50 MG tablet Take 50 mg by mouth daily. 05/25/16  Yes [provider]  metoprolol succinate (TOPROL-XL) 100 MG 24 hr tablet Take 100 mg by mouth daily. 08/04/16  Yes [provider]  nitroGLYCERIN (NITROSTAT) 0.4 MG SL tablet Place 0.4 mg under the tongue every 5 (five) minutes as needed for chest pain.  07/07/16  Yes [provider]  pantoprazole (PROTONIX) 40 MG tablet Take 40 mg by mouth 2 (two) times daily. 06/03/16  Yes [provider]  PROAIR HFA 108 (90 Base) MCG/ACT inhaler Inhale 1-2 puffs into the lungs every 4 (four) hours as needed for wheezing.  07/07/16  Yes [provider]  rosuvastatin (CRESTOR) 10 MG tablet Take 10 mg by mouth every evening. 07/07/16  Yes [provider]  tamsulosin (FLOMAX) 0.4 MG CAPS capsule Take 0.4 mg by mouth daily. 06/05/16  Yes [provider]  topiramate (TOPAMAX) 50 MG tablet Take 50 mg by mouth 2 (two) times daily. 06/05/16  Yes [provider]  warfarin (COUMADIN) 5 MG tablet Take 5-7.5 mg by mouth See admin instructions. Take 7.5 mg on Monday, Wednesday and Friday then take 5 mg all the other days 07/17/16  Yes [provider]    Physical Exam: Vitals:   02/10/17 2300 02/10/17 2345 02/11/17 0000 02/11/17 0030  BP: 112/80 (!) 95/39 100/63 100/65  Pulse:  92 88 88 88  Resp: (!) 24 19 (!) 24 (!) 23  Temp:      TempSrc:      SpO2: 97% 98% 98% 99%    Constitutional: NAD, calm, comfortable Eyes: PERRL, lids and conjunctivae normal ENMT: Mucous membranes are moist. Posterior pharynx clear of any exudate or lesions.Normal dentition.  Neck: normal, supple, no masses, no thyromegaly Respiratory: clear to auscultation bilaterally, no wheezing, no crackles. Normal respiratory effort. No accessory muscle use.  Cardiovascular: Has murmur, doesn't have pulsus-paradoxus.  3+ BLE pitting edema Abdomen: no tenderness, no masses palpated. No hepatosplenomegaly. Bowel sounds positive.  Musculoskeletal: no clubbing / cyanosis. No joint deformity upper and lower extremities. Good ROM, no contractures. Normal muscle tone.  Skin: no rashes, lesions, ulcers. No induration Neurologic: CN 2-12 grossly intact. Sensation intact, DTR normal. Strength 5/5 in all 4.  Psychiatric: Normal judgment and insight. Alert and oriented x 3. Normal mood.    Labs on Admission: I have personally reviewed following labs and imaging studies  CBC: Recent Labs  Lab 02/10/17 2024  WBC 13.1*  HGB 14.9  HCT 44.6  MCV 90.1  PLT 215   Basic Metabolic Panel: Recent Labs  Lab 02/10/17 2024  NA 132*  K 4.0  CL 102  CO2 21*  GLUCOSE 109*  BUN 18  CREATININE 0.88  CALCIUM 8.5*   GFR: CrCl cannot be calculated (Unknown ideal weight.). Liver Function Tests: No results for input(s): AST, ALT, ALKPHOS, BILITOT, PROT, ALBUMIN in the last 168 hours. No results for input(s): LIPASE, AMYLASE in the last 168 hours. No results for input(s): AMMONIA in the last 168 hours. Coagulation Profile: Recent Labs  Lab 02/10/17 2024  INR 2.08   Cardiac Enzymes: No results for input(s): CKTOTAL, CKMB, CKMBINDEX, TROPONINI in the last 168 hours. BNP (last 3 results) No results for input(s): PROBNP in the last 8760 hours. HbA1C: No results for input(s): HGBA1C in the last 72  hours. CBG: No results for input(s): GLUCAP in the last 168 hours. Lipid Profile: No results for input(s): CHOL, HDL, LDLCALC, TRIG, CHOLHDL, LDLDIRECT in the last 72 hours. Thyroid Function Tests: No results for input(s): TSH, T4TOTAL, FREET4, T3FREE, THYROIDAB in the last 72 hours. Anemia Panel: No results for input(s): VITAMINB12, FOLATE, FERRITIN, TIBC, IRON, RETICCTPCT in the last 72 hours. Urine analysis: No results found for: COLORURINE, APPEARANCEUR, LABSPEC, PHURINE, GLUCOSEU, HGBUR, BILIRUBINUR, KETONESUR, PROTEINUR, UROBILINOGEN, NITRITE, LEUKOCYTESUR  Radiological Exams on Admission: Ct Angio Chest Pe W And/or Wo Contrast  Result Date: 02/10/2017 CLINICAL DATA:  Sharp chest pain radiating to the neck beginning this morning. History of pulmonary embolism, hypertension, coronary artery disease and stent and COPD. EXAM: CT ANGIOGRAPHY CHEST WITH CONTRAST TECHNIQUE: Multidetector CT imaging of the chest was performed using the standard protocol during bolus administration  of intravenous contrast. Multiplanar CT image reconstructions and MIPs were obtained to evaluate the vascular anatomy. CONTRAST:  78 cc ISOVUE-370 IOPAMIDOL (ISOVUE-370) INJECTION 76% COMPARISON:  Chest radiograph February 10, 2017 at 1953 hours FINDINGS: CARDIOVASCULAR: Adequate contrast opacification of the pulmonary artery's. Main pulmonary artery is not enlarged. No pulmonary arterial filling defects to the level of the subsegmental branches. Heart size is normal. Moderate coronary artery calcifications. Small moderate pericardial effusion measuring to 9 mm in depth. Thoracic aorta is normal course and caliber, mild calcific atherosclerosis. MEDIASTINUM/NODES: No lymphadenopathy by CT size criteria. 9 mm RIGHT peritracheal lymph node. LUNGS/PLEURA: Tracheobronchial tree is patent, no pneumothorax. Mild bronchial wall thickening. No pleural effusions, focal consolidations, pulmonary nodules or masses. Bibasilar  atelectasis/scarring. Faint tree-in-bud densities LEFT upper lobe. UPPER ABDOMEN: Included view of the abdomen is unremarkable. MUSCULOSKELETAL: Visualized soft tissues and included osseous structures are nonacute. Mild degenerative change of the thoracic spine. Review of the MIP images confirms the above findings. IMPRESSION: 1. No acute pulmonary embolism. 2. Moderate pericardial effusion. 3. Bilateral upper lobe respiratory bronchiolitis. No focal consolidation. 4. Bronchial wall thickening seen with bronchitis or reactive airway disease. Aortic Atherosclerosis (ICD10-I70.0). Electronically Signed   By: Awilda Metroourtnay  Bloomer M.D.   On: 02/10/2017 22:23   Dg Chest Port 1 View  Result Date: 02/10/2017 CLINICAL DATA:  Patient is from home, significant cardiac history, having sharp pain across his chest and radiates up his neck on both sides. Diagnosed with COPD last year. Patient does have a RBBB. Since 7a, patient has taken 7-8 SL nitro which has not helped. EXAM: PORTABLE CHEST 1 VIEW COMPARISON:  08/16/2016 FINDINGS: The heart size and mediastinal contours are within normal limits. Both lungs are clear. The visualized skeletal structures are unremarkable. IMPRESSION: No active disease. Electronically Signed   By: Norva PavlovElizabeth  Brown M.D.   On: 02/10/2017 20:20    EKG: Independently reviewed.  Assessment/Plan Principal Problem:   Acute on chronic right heart failure (HCC) Active Problems:   Anti-phospholipid syndrome (HCC)   Essential hypertension, benign   CAD S/P multiple PCIs    1. Acute on chronic right heart failure - does seem a bit odd though that even with symptoms very suggestive of R heart failure, and a history which could easily explain the worsening (cutting back on his lasix), that his BNP isnt more elevated. 1. CHF pathway 2. Cards eval 3. Will increase lasix to 40mg  IV daily at least until cards sees patient. 4. 2d echo 5. Tele monitor 2. HTN - 1. Will reduce by half his dose of  ARB (so losartan 25mg  daily) due to borderline hypotension.  Doesn't seem to be very symptomatic with this though. 2. Continue metoprolol 3. Continue Imdur 3. H/o PE - 1. Continue coumadin 2. No PE on CTA today 4. CAD - 1. Trop neg 2. EKG unchanged 3. Unable to afford Ranexa so still taking imdur.  DVT prophylaxis: Coumadin Code Status: Full Family Communication: No family in room Disposition Plan: Home after admit Consults called: EDP called Cards, they are coming to see patient Admission status: Place inobs   Hillary BowGARDNER, JARED M. DO [facility] Pager [phone]  If 7AM-7PM, please contact day team taking care of patient [email or web address] Password [email or web address]  02/11/2017, 12:38 AM

## 2017-02-11 NOTE — Consult Note (Signed)
Cardiology Consultation:   Patient ID: Jose [name]; [id number]; July 09, 1955   Admit date: 02/10/2017 Date of Consult: 02/11/2017  Primary Care Provider: [name], MD Primary Cardiologist: Dr. Jesse Fall (Dr. Anne Fu if [facility])  Patient Profile:   Jose [name] is a 61 y.o. 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 Roda Shutters.   Last cath 05/14/15 @ [facility] L main ostium 30%  Left Anterior Descending * Ostium LAD 50% Mid LAD 40%  Left Circumflex/Ramus * Mid Circumflex 100% 0 * 2nd Marginal 50%  Right Coronary Artery * Mid RCA 70% previous stent * Distal RCA 70% * Denotes significant lesion.  Ao/BP (asc Ao): 117/67 Mean: 84 mmHg LV: 108/22 EDP: 22 mmHg  INTERVENTIONS Mid RCA 70% to normal - STENT, RESOLUTE INTEGRITY RX 3.5X18MM Distal RCA 70% to 10% - STENT, RESOLUTE INTEGRITY RX 3.5X22MM  Dr Christell Constant said he felt it would be difficult to intervene on the CFX lesion.    Echo 12/06/2015: LVEF >55%.  Diastolic function not assessed.  No valvular disease.   Last stress test 11/2015 @ [facility]  Myocardial perfusion imaging is Abnormal- Mild RCA territory ischemia.  Summed severity score is normal.  Artifacts noted:Diaphragm and soft tissue.  Overall left ventricular systolic function was Abnormal- 46% with  regional wall motion abnormalities (see above).  Compared to the prior study from 12/2012, the current study reveals mild  inferior wall ischemia.  Last seen by Dr. Christell Constant 05/06/16. Per note "test shows ischemia, but his anatomy would be difficult to stent". Symptoms improved little with Imdur.   Seen by Dr. Anne Fu 08/16/16 for chest pain while he was admitted to [facility]. His Imdur  restarted. Enzyme negative. Patient states he always has negative enzyme. Declined cath due to cost. He never followed up with cardiologist since then. Can not afford Ranexa.   History of Present Illness:   Jose [name] presented with progressive worsening of DOE, orthopnea and weight gain for the past 2 weeks. Yesterday while sitting he has substernal chest pressure describes as "elephent sitting" radiating to his neck. Prior angina symptoms is only neck and arm pain. He never chest chest pressure. Sleeps chronically on recline. Uses 4L oxygen and CPAP at night. Unsure which medication he is taking. No daily weight check.   D-dimer elevated. No PE on CTA however showed moderate pericardial effusion. Bronchial wall thickening seen with bronchitis or reactive airway disease. Echo done-->pending reading. POC troponin negative. BNP normal. INR 2.26. CXR clear.  Currently chest pain free however mild neck discomfort.   Past Medical History:  Diagnosis Date  . Antiphospholipid antibody syndrome (HCC)   . CAD S/P percutaneous coronary angioplasty '98, '04, Feb 2017   [facility]  . Carotid arterial disease (HCC) 10/2015   moderate bilateral  . COPD (chronic obstructive pulmonary disease) (HCC)   . Coronary artery disease   . Hypertension   . Non-insulin treated type 2 diabetes mellitus (HCC)   . Pulmonary emboli (HCC) 2011  . RBBB   . Sleep apnea     Past Surgical History:  Procedure Laterality Date  . APPENDECTOMY    . BACK SURGERY  '89, '06  . COLONOSCOPY    .  CORONARY ANGIOPLASTY WITH STENT PLACEMENT  '98, '04, Feb 2017     Inpatient Medications: Scheduled Meds: . aspirin EC  81 mg Oral Daily  . furosemide  40 mg Intravenous Daily  . isosorbide mononitrate  60 mg Oral Daily  . losartan  25 mg Oral Daily  . metoprolol succinate  100 mg Oral Daily  . pantoprazole  40 mg Oral BID  . rosuvastatin  10 mg Oral QPM  . sodium chloride flush  3 mL Intravenous Q12H  . tamsulosin  0.4 mg Oral  Daily  . topiramate  50 mg Oral BID  . warfarin  5 mg Oral ONCE-1800  . Warfarin - Pharmacist Dosing Inpatient   Does not apply q1800   Continuous Infusions: . sodium chloride     PRN Meds: sodium chloride, acetaminophen, albuterol, ondansetron (ZOFRAN) IV, sodium chloride flush  Allergies:   No Known Allergies  Social History:   Social History   Socioeconomic History  . Marital status: Married    Spouse name: Not on file  . Number of children: Not on file  . Years of education: Not on file  . Highest education level: Not on file  Social Needs  . Financial resource strain: Not on file  . Food insecurity - worry: Not on file  . Food insecurity - inability: Not on file  . Transportation needs - medical: Not on file  . Transportation needs - non-medical: Not on file  Occupational History  . Not on file  Tobacco Use  . Smoking status: Current Every Day Smoker    Packs/day: 0.50    Types: Cigarettes  . Smokeless tobacco: Never Used  Substance and Sexual Activity  . Alcohol use: No  . Drug use: No  . Sexual activity: Yes  Other Topics Concern  . Not on file  Social History Narrative  . Not on file    Family History:    Family History  Problem Relation Age of Onset  . CAD Father 75     ROS:  Please see the history of present illness.  ROS All other ROS reviewed and negative.     Physical Exam/Data:   Vitals:   02/11/17 0800 02/11/17 0900 02/11/17 1000 02/11/17 1102  BP: 109/73 122/72 108/61 117/68  Pulse: 85 85 91 97  Resp: 18 16 18 18   Temp:      TempSrc:      SpO2: 99% 98% 99% 91%    Intake/Output Summary (Last 24 hours) at 02/11/2017 1254 Last data filed at 02/11/2017 0055 Gross per 24 hour  Intake 3 ml  Output -  Net 3 ml    General:  Obese male in no acute distres HEENT: normal Lymph: no adenopathy Neck: no JVD Endocrine:  No thryomegaly Vascular: No carotid bruits; FA pulses 2+ bilaterally without bruits  Cardiac:  normal S1, S2; RRR;  Systolic  murmur Lungs:  clear to auscultation bilaterally, no wheezing, rhonchi or rales  Abd: soft, nontender, no hepatomegaly  Distended  Ext: 2+ BL LE edema Musculoskeletal:  No deformities, BUE and BLE strength normal and equal Skin: warm and dry  Neuro:  CNs 2-12 intact, no focal abnormalities noted Psych:  Normal affect   EKG:  The EKG was personally reviewed and demonstrates:  SR at rater of 93 bpm and chronic RBBB Telemetry:  Telemetry was personally reviewed and demonstrates:  SR at rate of 90s  Relevant CV Studies: As above  Laboratory Data:  Chemistry Recent Labs  Lab 02/10/17  2024  NA 132*  K 4.0  CL 102  CO2 21*  GLUCOSE 109*  BUN 18  CREATININE 0.88  CALCIUM 8.5*  GFRNONAA >60  GFRAA >60  ANIONGAP 9    Hematology Recent Labs  Lab 02/10/17 2024 02/11/17 0046  WBC 13.1* 12.3*  RBC 4.95 4.50  HGB 14.9 13.4  HCT 44.6 40.7  MCV 90.1 90.4  MCH 30.1 29.8  MCHC 33.4 32.9  RDW 14.4 14.5  PLT 215 215   Recent Labs  Lab 02/10/17 2044  TROPIPOC 0.00    BNP Recent Labs  Lab 02/10/17 2025  BNP 14.6    DDimer  Recent Labs  Lab 02/10/17 2024  DDIMER 1.04*   Radiology/Studies:  Ct Angio Chest Pe W And/or Wo Contrast  Result Date: 02/10/2017 CLINICAL DATA:  Sharp chest pain radiating to the neck beginning this morning. History of pulmonary embolism, hypertension, coronary artery disease and stent and COPD. EXAM: CT ANGIOGRAPHY CHEST WITH CONTRAST TECHNIQUE: Multidetector CT imaging of the chest was performed using the standard protocol during bolus administration of intravenous contrast. Multiplanar CT image reconstructions and MIPs were obtained to evaluate the vascular anatomy. CONTRAST:  78 cc ISOVUE-370 IOPAMIDOL (ISOVUE-370) INJECTION 76% COMPARISON:  Chest radiograph February 10, 2017 at 1953 hours FINDINGS: CARDIOVASCULAR: Adequate contrast opacification of the pulmonary artery's. Main pulmonary artery is not enlarged. No pulmonary arterial  filling defects to the level of the subsegmental branches. Heart size is normal. Moderate coronary artery calcifications. Small moderate pericardial effusion measuring to 9 mm in depth. Thoracic aorta is normal course and caliber, mild calcific atherosclerosis. MEDIASTINUM/NODES: No lymphadenopathy by CT size criteria. 9 mm RIGHT peritracheal lymph node. LUNGS/PLEURA: Tracheobronchial tree is patent, no pneumothorax. Mild bronchial wall thickening. No pleural effusions, focal consolidations, pulmonary nodules or masses. Bibasilar atelectasis/scarring. Faint tree-in-bud densities LEFT upper lobe. UPPER ABDOMEN: Included view of the abdomen is unremarkable. MUSCULOSKELETAL: Visualized soft tissues and included osseous structures are nonacute. Mild degenerative change of the thoracic spine. Review of the MIP images confirms the above findings. IMPRESSION: 1. No acute pulmonary embolism. 2. Moderate pericardial effusion. 3. Bilateral upper lobe respiratory bronchiolitis. No focal consolidation. 4. Bronchial wall thickening seen with bronchitis or reactive airway disease. Aortic Atherosclerosis (ICD10-I70.0). Electronically Signed   By: Awilda Metroourtnay  Bloomer M.D.   On: 02/10/2017 22:23   Dg Chest Port 1 View  Result Date: 02/10/2017 CLINICAL DATA:  Patient is from home, significant cardiac history, having sharp pain across his chest and radiates up his neck on both sides. Diagnosed with COPD last year. Patient does have a RBBB. Since 7a, patient has taken 7-8 SL nitro which has not helped. EXAM: PORTABLE CHEST 1 VIEW COMPARISON:  08/16/2016 FINDINGS: The heart size and mediastinal contours are within normal limits. Both lungs are clear. The visualized skeletal structures are unremarkable. IMPRESSION: No active disease. Electronically Signed   By: Norva PavlovElizabeth  Brown M.D.   On: 02/10/2017 20:20    Assessment and Plan:   1. Chest pain - He has hx of multiple stent. Last PCI 04/2015 to RCA. Mild RCA territory ischemia on  stress test 11/2015. - Recommended cath during 08/2016 admission however declined cath.  - Yesterday had 4/10 chest pain--> resolved after 3 SL nitro. Currently have mild neck discomfort without chest pain. EKG without acute ischemic changes. Likely medical therapy.  - Symptoms concerning for acute pericarditis--> short taper dose of steroids and colchicine for 3 months.   2.  Acute diastolic CHF - BNP normal. preliminary review of  echo showed normal LVEF and overall normal RV. Mild pericardial effusion without tamponade. Seems more of his symptoms is due to lung issue. - Increase lasix to 40mg  BID. Stop Losartan for BP support. Strict I & O and daily weight.   3. CAD s/p multiple stent - LAst PCI as above. Continue antianginal therapy with ASA, Toprol XL 100 and Imdur 60mg  Qd.   4. COPD/OSA - on CPAP  5. 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 HTN - Meds changes as above  7. HLD - 08/16/2016: Cholesterol 156; HDL 28; LDL Cholesterol 64; Triglycerides 322; VLDL 64  - Continue statin  For questions or updates, please contact [facility] Please consult [email or web address] for contact info under Cardiology/STEMI.   Lorelei Pont, [place]  02/11/2017 12:54 PM   I have seen and examined the patient along with Manson Passey, PA .  I have reviewed the chart, notes and new data.  I agree with PA's note.  Key new complaints: His chest pain is clearly pleuritic and clearly positional.  It worsens when he leans back.  He was also distinctly worse when he takes a deep breath.  Respiratory complaints began roughly 3 weeks before the chest discomfort.  He is therapeutically anticoagulated with warfarin. Key examination changes: He has chronic appearing edema of both lower extremities with chronic brawny hypertrophic skin changes.  Hard to see his jugular venous pulsations. Key new findings / data:   - I disagree a little bit with the  evaluation of his echocardiogram as follows: Although he has normal overall left ventricular systolic function, I believe there is evidence of mild inferior wall hypokinesis, consistent with previous event in the territory of the right coronary artery where he has 2 stents.  I also think there is evidence of mild right ventricular dilatation mild right ventricular dysfunction, as evidenced by borderline TAPSE tricuspid annulus systolic velocities.  The pericardial effusion is indeed small, unlikely to cause tamponade.  There is some enhanced respiratory variability in the mitral inflow, but this is probably due to increased respiratory effort of COPD, rather than tamponade.  Inferior vena cava is dilated. 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  The ST segments are upwardly convex.  No reciprocal changes are present.  PLAN: - Chest pain complaints are highly consistent with acute pericarditis.  The ECG changes could be attributed to this, although it is hard to entirely exclude ischemia. More than 18 months have passed since his last right coronary artery revascularization procedure and it is unlikely that he has restenosis in the stents.  He is known to have residual right coronary artery disease could not be fixed percutaneously.  Troponin level is undetectable. - Would prefer to avoid treatment with NSAIDs since he is on antiplatelet and anticoagulant therapy.  Suggest a brief course of oral steroids followed by 3 months of colchicine. - While statistically it is most likely that he has acute viral pericarditis, it is also possible that he has autoimmune pericarditis; he also has lupus anticoagulant.  Consider checking more serologies for autoimmune disease.  Sedimentation rate and CRP would not be helpful, as they would be elevated either way. - He is clearly  hypervolemic.  He will benefit from diuretics.  He has preserved overall left ventricular systolic function and there is only doubtful benefit from treatment  with ARB.  We will stop this since he has had issues with hypotension when diuresed in the past. - He has evidence of right heart failure clinically and by echocardiography, likely related to chronic obstructive lung disease and obstructive sleep apnea.  Thurmon FairMihai Dinesha Twiggs, MD, [facility] ChildrenFACC [facility] [phone] 02/11/2017, 6:04 PM

## 2017-02-11 NOTE — ED Notes (Signed)
Lunch tray ordered 

## 2017-02-11 NOTE — Progress Notes (Signed)
Am admission, detail please to hpi Patient is seen and examined, he reports chest pain, sore throat.  Vital signs stable. + cardiac murmur ( he reports not aware of it), + edema and signs of volume overload on exam, he is on 4liter o2, ( he use cpap and nightly o2 at baseline)  Echo pending Cardiology consulted, will follow recommendations.

## 2017-02-11 NOTE — Progress Notes (Signed)
ANTICOAGULATION CONSULT NOTE   Pharmacy Consult for warfarin  Indication: hx of antiphospholipid syndrome, hx DVT/PE  Vital Signs: BP: 109/73 (11/29 0800) Pulse Rate: 85 (11/29 0800)  Labs: Recent Labs    02/10/17 2024 02/11/17 0046 02/11/17 0337  HGB 14.9 13.4  --   HCT 44.6 40.7  --   PLT 215 215  --   LABPROT 23.2*  --  24.8*  INR 2.08  --  2.26  CREATININE 0.88  --   --     CrCl cannot be calculated (Unknown ideal weight.).  Assessment: 61 yo male admitted with chest pain. Per notes, CT negative for PE. Pharmacy consulted to continue dosing PTA warfarin. Last dose PTA taken on 11/27. INR remains therapeutic at 2.26. No bleeding noted.  PTA warfarin dose: 7.5mg  MWF and 5mg  all other days - per patient report.   Goal of Therapy:  INR 2-3 Monitor platelets by anticoagulation protocol: Yes   Plan:  Warfarin 5mg  PO x 1 tonight Daily INR  Lysle Pearlachel Claritza July, PharmD, BCPS Phone #: [phone] until 3:30pm All other times, call Main Pharmacy x [phone] 02/11/2017 9:09 AM

## 2017-02-11 NOTE — Progress Notes (Signed)
  Echocardiogram 2D Echocardiogram has been performed.  Atiya Yera T Jose [name] 02/11/2017, 10:08 AM

## 2017-02-11 NOTE — ED Notes (Signed)
Care handoff to Julia RN 

## 2017-02-11 NOTE — ED Notes (Addendum)
PT's 5 lead alarms for ST elevation, changes noted on 5 lead. PT rates pain 4/10 and reports no associated symptoms. PT reports his pain is much improved from yesterday. EKG ordered. MD at nurses station in pod

## 2017-02-12 DIAGNOSIS — I319 Disease of pericardium, unspecified: Secondary | ICD-10-CM | POA: Diagnosis present

## 2017-02-12 LAB — BASIC METABOLIC PANEL
ANION GAP: 5 (ref 5–15)
BUN: 20 mg/dL (ref 6–20)
CO2: 27 mmol/L (ref 22–32)
Calcium: 8.8 mg/dL — ABNORMAL LOW (ref 8.9–10.3)
Chloride: 103 mmol/L (ref 101–111)
Creatinine, Ser: 0.77 mg/dL (ref 0.61–1.24)
GFR calc non Af Amer: >60 mL/min (ref >60)
GLUCOSE: 139 mg/dL — AB (ref 65–99)
POTASSIUM: 4 mmol/L (ref 3.5–5.1)
Sodium: 135 mmol/L (ref 135–145)

## 2017-02-12 LAB — PROTIME-INR
INR: 2.53
Prothrombin Time: 27.1 seconds — ABNORMAL HIGH (ref 11.4–15.2)

## 2017-02-12 MED ORDER — POLYETHYLENE GLYCOL 3350 17 G PO PACK: 17.0000 g | PACK | Freq: Every day | ORAL | 0 refills | Status: DC

## 2017-02-12 MED ORDER — COLCHICINE 0.6 MG PO TABS: 0.6000 mg | ORAL_TABLET | Freq: Two times a day (BID) | ORAL | 2 refills | Status: DC

## 2017-02-12 MED ORDER — PREDNISONE 10 MG PO TABS: ORAL_TABLET | ORAL | 0 refills | Status: AC

## 2017-02-12 MED ORDER — FUROSEMIDE 40 MG PO TABS: 40.0000 mg | ORAL_TABLET | Freq: Every day | ORAL | 2 refills | Status: DC

## 2017-02-12 MED ORDER — PREDNISONE 20 MG PO TABS: 30.0000 mg | ORAL_TABLET | Freq: Every day | ORAL | Status: DC

## 2017-02-12 MED ORDER — PREDNISONE 10 MG PO TABS: 10.0000 mg | ORAL_TABLET | Freq: Every day | ORAL | Status: DC

## 2017-02-12 MED ORDER — POLYETHYLENE GLYCOL 3350 17 G PO PACK: 17.0000 g | PACK | Freq: Once | ORAL | Status: DC

## 2017-02-12 MED ORDER — WARFARIN SODIUM 7.5 MG PO TABS: 7.5000 mg | ORAL_TABLET | Freq: Once | ORAL | Status: DC

## 2017-02-12 MED ORDER — PREDNISONE 20 MG PO TABS: 20.0000 mg | ORAL_TABLET | Freq: Every day | ORAL | Status: DC

## 2017-02-12 MED ORDER — PREDNISONE 5 MG PO TABS: 5.0000 mg | ORAL_TABLET | Freq: Every day | ORAL | Status: DC

## 2017-02-12 MED ORDER — ISOSORBIDE MONONITRATE ER 60 MG PO TB24
60.0000 mg | ORAL_TABLET | Freq: Every day | ORAL | 1 refills | Status: DC
Start: 2017-02-13 — End: 2019-03-20

## 2017-02-12 NOTE — Progress Notes (Signed)
Progress Note  Patient Name: Jose [name] Date of Encounter: 02/12/2017  [facility]  Subjective   Some improvement, not using C-pap at night  Inpatient Medications    Scheduled Meds: . aspirin EC  81 mg Oral Daily  . colchicine  0.6 mg Oral BID  . furosemide  40 mg Intravenous BID  . isosorbide mononitrate  60 mg Oral Daily  . metoprolol succinate  100 mg Oral Daily  . pantoprazole  40 mg Oral BID  . potassium chloride  10 mEq Oral BID  . [START ON 02/15/2017] predniSONE  10 mg Oral Q breakfast  . [START ON 02/14/2017] predniSONE  20 mg Oral Q breakfast  . [START ON 02/13/2017] predniSONE  30 mg Oral Q breakfast  . predniSONE  40 mg Oral Q breakfast  . [START ON 02/16/2017] predniSONE  5 mg Oral Q breakfast  . predniSONE  50 mg Oral Q breakfast  . rosuvastatin  10 mg Oral QPM  . sodium chloride flush  3 mL Intravenous Q12H  . tamsulosin  0.4 mg Oral Daily  . topiramate  50 mg Oral BID  . Warfarin - Pharmacist Dosing Inpatient   Does not apply q1800   Continuous Infusions: . sodium chloride     PRN Meds: sodium chloride, acetaminophen, albuterol, ondansetron (ZOFRAN) IV, sodium chloride flush   Vital Signs    Vitals:   02/11/17 2100 02/12/17 0016 02/12/17 0456 02/12/17 0814  BP: (!) 106/51 106/60 119/72 (!) 98/48  Pulse: 91 86 80 72  Resp: 20 20 20 20   Temp: 98 F (36.7 C) 98.5 F (36.9 C) 98.1 F (36.7 C) 98.2 F (36.8 C)  TempSrc: Oral Oral Oral Oral  SpO2: 93% 94% 97% 95%  Weight:   240 lb (108.9 kg)   Height:        Intake/Output Summary (Last 24 hours) at 02/12/2017 0950 Last data filed at 02/12/2017 0500 Gross per 24 hour  Intake 3 ml  Output 900 ml  Net -897 ml   Filed Weights   02/11/17 1728 02/12/17 0456  Weight: 241 lb 14.4 oz (109.7 kg) 240 lb (108.9 kg)    Telemetry    NSR - Personally Reviewed  ECG    NSR, RBBB, inferior lateral ST changes - Personally Reviewed  Physical Exam   GEN: Obese male, No  acute distress.   Neck: No JVD Cardiac: RRR, no murmurs, rubs, or gallops.  Respiratory: decreased breath sounds, no rales MS: Trace LE edema; No deformity. Neuro:  Nonfocal  Psych: Normal affect   Labs    Chemistry Recent Labs  Lab 02/10/17 2024 02/12/17 0532  NA 132* 135  K 4.0 4.0  CL 102 103  CO2 21* 27  GLUCOSE 109* 139*  BUN 18 20  CREATININE 0.88 0.77  CALCIUM 8.5* 8.8*  GFRNONAA >60 >60  GFRAA >60 >60  ANIONGAP 9 5     Hematology Recent Labs  Lab 02/10/17 2024 02/11/17 0046  WBC 13.1* 12.3*  RBC 4.95 4.50  HGB 14.9 13.4  HCT 44.6 40.7  MCV 90.1 90.4  MCH 30.1 29.8  MCHC 33.4 32.9  RDW 14.4 14.5  PLT 215 215    Cardiac EnzymesNo results for input(s): TROPONINI in the last 168 hours.  Recent Labs  Lab 02/10/17 2044  TROPIPOC 0.00     BNP Recent Labs  Lab 02/10/17 2025  BNP 14.6     DDimer  Recent Labs  Lab 02/10/17 2024  DDIMER  1.04*     Radiology    Ct Angio Chest Pe W And/or Wo Contrast  Result Date: 02/10/2017 CLINICAL DATA:  Sharp chest pain radiating to the neck beginning this morning. History of pulmonary embolism, hypertension, coronary artery disease and stent and COPD. EXAM: CT ANGIOGRAPHY CHEST WITH CONTRAST TECHNIQUE: Multidetector CT imaging of the chest was performed using the standard protocol during bolus administration of intravenous contrast. Multiplanar CT image reconstructions and MIPs were obtained to evaluate the vascular anatomy. CONTRAST:  78 cc ISOVUE-370 IOPAMIDOL (ISOVUE-370) INJECTION 76% COMPARISON:  Chest radiograph February 10, 2017 at 1953 hours FINDINGS: CARDIOVASCULAR: Adequate contrast opacification of the pulmonary artery's. Main pulmonary artery is not enlarged. No pulmonary arterial filling defects to the level of the subsegmental branches. Heart size is normal. Moderate coronary artery calcifications. Small moderate pericardial effusion measuring to 9 mm in depth. Thoracic aorta is normal course and  caliber, mild calcific atherosclerosis. MEDIASTINUM/NODES: No lymphadenopathy by CT size criteria. 9 mm RIGHT peritracheal lymph node. LUNGS/PLEURA: Tracheobronchial tree is patent, no pneumothorax. Mild bronchial wall thickening. No pleural effusions, focal consolidations, pulmonary nodules or masses. Bibasilar atelectasis/scarring. Faint tree-in-bud densities LEFT upper lobe. UPPER ABDOMEN: Included view of the abdomen is unremarkable. MUSCULOSKELETAL: Visualized soft tissues and included osseous structures are nonacute. Mild degenerative change of the thoracic spine. Review of the MIP images confirms the above findings. IMPRESSION: 1. No acute pulmonary embolism. 2. Moderate pericardial effusion. 3. Bilateral upper lobe respiratory bronchiolitis. No focal consolidation. 4. Bronchial wall thickening seen with bronchitis or reactive airway disease. Aortic Atherosclerosis (ICD10-I70.0). Electronically Signed   By: Awilda Metroourtnay  Bloomer M.D.   On: 02/10/2017 22:23   Dg Chest Port 1 View  Result Date: 02/10/2017 CLINICAL DATA:  Patient is from home, significant cardiac history, having sharp pain across his chest and radiates up his neck on both sides. Diagnosed with COPD last year. Patient does have a RBBB. Since 7a, patient has taken 7-8 SL nitro which has not helped. EXAM: PORTABLE CHEST 1 VIEW COMPARISON:  08/16/2016 FINDINGS: The heart size and mediastinal contours are within normal limits. Both lungs are clear. The visualized skeletal structures are unremarkable. IMPRESSION: No active disease. Electronically Signed   By: Norva PavlovElizabeth  Brown M.D.   On: 02/10/2017 20:20    Cardiac Studies   Echo 02/11/17 Study Conclusions  - Left ventricle: The cavity size was mildly dilated. Wall   thickness was normal. Systolic function was normal. The estimated   ejection fraction was in the range of 55% to 60%. The study is   not technically sufficient to allow evaluation of LV diastolic   function. - Aortic valve:  AV is thickened, calcified with mildly restricted   motion. Peak and mean gradients across valve are 23 and 13 mm Hg   respectively. There was trivial regurgitation. - Pericardium, extracardiac: A small pericardial effusion was   identified.   Patient Profile     61 y.o. 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   Assessment & Plan    1. Chest pain - Presumed secondary pericarditis and volume overload. - Steroids added - IV Lasix added - Colchicine for 3 months.   2.  Acute diastolic CHF - BNP normal. preliminary review of echo showed normal  LVEF and overall normal RV. Mild pericardial effusion without tamponade.  - Lasix IV 40mg  BID. I/O neg 894cc, wgt down 1 lb  3. CAD s/p multiple stent - He has hx of multiple stents. Last PCI 04/2015 to RCA. Mild RCA territory ischemia on stress test 11/2015. cath recommended  during 08/2016 admission however pt declined cath.  -  Continue antianginal therapy with ASA, Toprol XL 100 and Imdur 60mg  Qd.   4. COPD/OSA - on CPAP- he declines using it while in the hospital  5. Anti-phospholipid antibody syndrome with hx of PE - on Coumadin-INR 2.53 - no PE on CTA  6. HTN - Now hypotensive- ARB stopped  7. HLD - 08/16/2016: Cholesterol 156; HDL 28; LDL Cholesterol 64; Triglycerides 322; VLDL 64  - Continue statin  Plan:  Suspect he still needs IV diuresis, MD to see. We will need to clarify f/u at discharge- [facility] or [facility].    For questions or updates, please contact [facility] Please consult [email or web address] for contact info under Cardiology/STEMI.      Signed, Corine Shelter, PA-C  02/12/2017, 9:50 AM    I have seen and examined the patient along with Corine Shelter, PA.  I have  reviewed the chart, notes and new data.  I agree with PA's note.  Key new complaints: Already has substantial improvement in pleuritic chest discomfort, able to lie back and take deep breaths with minimal discomfort Key examination changes: No pericardial rub is heard, no signs of tamponade, partial improvement in lower extremity edema. Key new findings / data: Roughly 1 L of net diuresis.  PLAN: From a cardiology standpoint, ready to be discharged on the rapid steroid taper and colchicine. 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  If pericarditis is persistent or recurrent, recommend referral to a rheumatologist and full serological evaluation. Would repeat his echocardiogram in several weeks (limited study for pericardial effusion). Discussed in detail ways to distinguish discomfort of pericarditis (pleuritic) versus coronary disease (exertional or relieved by nitroglycerin and reminiscent of previous angina). Would keep him off ARB and continue to push diuretics allowed by blood pressure.  Dominant hemodynamic problem is right heart failure secondary to cor pulmonale. (combination of previous pulmonary embolism, untreated obstructive sleep apnea, COPD).  Thurmon Fair, MD, [facility] [phone] 02/12/2017, 10:08 AM

## 2017-02-12 NOTE — Discharge Summary (Addendum)
Jose [name], is a 61 y.o. male  DOB 1955/06/17  MRN [id number].  Admission date:  02/10/2017  Admitting Physician  Hillary Bow, DO  Discharge Date:  02/12/2017   Primary MD  [name], MD  Recommendations for primary care physician for things to follow:    Admission Diagnosis  Bronchiolitis [J21.9] Pericardial effusion [I31.3]   Discharge Diagnosis  Bronchiolitis [J21.9] Pericardial effusion [I31.3]   Principal Problem:   Pericarditis, Acute Active Problems:   Anti-phospholipid syndrome (HCC)   Essential hypertension, benign   CAD S/P multiple PCIs   Acute on chronic right heart failure [facility])      Past Medical History:  Diagnosis Date  . Antiphospholipid antibody syndrome (HCC)   . CAD S/P percutaneous coronary angioplasty '98, '04, Feb 2017   [facility]  . Carotid arterial disease (HCC) 10/2015   moderate bilateral  . COPD (chronic obstructive pulmonary disease) (HCC)   . Coronary artery disease   . Hypertension   . Non-insulin treated type 2 diabetes mellitus (HCC)   . Pulmonary emboli (HCC) 2011  . RBBB   . Sleep apnea     Past Surgical History:  Procedure Laterality Date  . APPENDECTOMY    . BACK SURGERY  '89, '06  . COLONOSCOPY    . CORONARY ANGIOPLASTY WITH STENT PLACEMENT  '98, '04, Feb 2017       HPI  from the history and physical done on the day of admission:     Chief Complaint: DOE  HPI: Jose [name] is a 61 y.o. male with medical history significant of CAD s/p 7 stents last in early 2017, antiphospholipid syndrome with prior PE now on chronic coumadin, HTN, COPD.  Patient presents to the ED with c/o worsening SOB on exertion for the past 2 weeks.  Now gets severe SOB with walking just 25 feet.  Has known stable angina.  Was supposed to change Imdur to Ranexa after June hospital admission; however couldn't afford Ranexa which apparently is $300 a month even  after co-pay.  So he just stayed on Imdur.  He currently only takes 20mg  lasix daily instead of the prescribed 40mg , has been doing this for the past couple of weeks.  Says the 40mg  makes him use the bathroom too much.  ED Course: BPs running on the low side, however he states his SBPs typically run 80-100 over the past 1 year.  EKG shows RBBB, small-moderate pericardial effusion on CT scan.  Trop neg.  BNP is only 14.6.  Does have BLE 2-3+ pitting edema which has worsened over the past 2-3 weeks with associated weight gain.    Hospital Course:     61 y.o. obese malewith a hx of multiple PCI's at [facility],anti-phospholipid antibody syndromewith hx of PEon Coumadin, HTN, DM-diet, HLD, sleep apnea on CPAP, COPD on 4L oxygen at nightand moderate carotid disease by ultrasound 10/2015, seen 02/11/17 for the evaluation of chest pain and shortness of breath.  1) chest pain presumably secondary to acute Pericarditis-discussed with cardiologist Dr Royann Shivers, treat  empirically with prednisone taper and then colchicine for 90 days.   2)HFpEF- last known EF 55-60%, BNP is normal, cardiology consult appreciated, fluid balance neg 897, patient is noncompliant with fluid restriction, patient is requesting salt in his diet.  Continue to be compliant with low-salt diet and fluid restriction emphasized the patient.   3)CAD/ S/p prior angioplasty and multiple stents-no ACS type symptoms at this time, patient has ruled out for ACS, continue aspirin, metoprolol and isosorbide, He has hx of multiple stents. Last PCI 04/2015 to RCA.Mild RCA territory ischemiaon stress test 11/2015. cath recommended  during 08/2016 admission however pt declined cath.   4)OSA/COPD-patient is not compliant with CPAP even in the hospital  5) hypercoagulable state due to antiphospholipid antibody syndrome with a history of previous PE-CTA chest negative for PE this admission, INR is therapeutic, continue Coumadin therapy  6)HTN-blood  pressure has been soft, Losartan has been  Discontinued  Disposition- D/w Dr Royann Shiversroitoru who okay's discharge home today  Discharge Condition: stable  Follow UP  Follow-up Information    Ephriam JenkinsFowler, Walter E, MD.   Specialty:  Internal Medicine Why:  Patient will call Contact information: [address] [place] [phone]            Consults obtained - Cardiology  Diet and Activity recommendation:  As advised  Discharge Instructions    Discharge Instructions    (HEART FAILURE PATIENTS) Call MD:  Anytime you have any of the following symptoms: 1) 3 pound weight gain in 24 hours or 5 pounds in 1 week 2) shortness of breath, with or without a dry hacking cough 3) swelling in the hands, feet or stomach 4) if you have to sleep on extra pillows at night in order to breathe.   Complete by:  As directed    Call MD for:  difficulty breathing, headache or visual disturbances   Complete by:  As directed    Call MD for:  persistant dizziness or light-headedness   Complete by:  As directed    Call MD for:  persistant nausea and vomiting   Complete by:  As directed    Call MD for:  severe uncontrolled pain   Complete by:  As directed    Call MD for:  temperature >100.4   Complete by:  As directed    Diet - low sodium heart healthy   Complete by:  As directed    Discharge instructions   Complete by:  As directed    1)Take Prednisone 30 mg on 02/13/2017, 20 mg on 02/14/2017, 10 mg on 02/15/2017, and 5 mg 03/08/2017, always take prednisone with food  2) take colchicine 0.6 mg twice daily for 3 months  3)Follow up with cardiologist as advised within the next couple of weeks  4)INR recheck in 1-2 weeks  5)Avoid ibuprofen/Advil/Aleve/Motrin/Goody Powders/Naproxen/BC powders as these will make you more likely to bleed and can cause stomach ulcers  6)Avoid Drinking more than 1,500 ML (1.5 Liters) of fluids per day  7)Daily Weight advised, call if you gain more than 3  pounds in 1 day or more than 5 pounds in 1 week  8)Stop Losartan   Increase activity slowly   Complete by:  As directed         Discharge Medications     Allergies as of 02/12/2017   No Known Allergies     Medication List    STOP taking these medications   losartan 50 MG tablet Commonly known as:  COZAAR  TAKE these medications   aspirin EC 81 MG tablet Take 81 mg by mouth daily.   colchicine 0.6 MG tablet Take 1 tablet (0.6 mg total) by mouth 2 (two) times daily.   furosemide 40 MG tablet Commonly known as:  LASIX Take 1 tablet (40 mg total) by mouth daily. What changed:    medication strength  how much to take   isosorbide mononitrate 60 MG 24 hr tablet Commonly known as:  IMDUR Take 1 tablet (60 mg total) by mouth daily. Start taking on:  02/13/2017   metoprolol succinate 100 MG 24 hr tablet Commonly known as:  TOPROL-XL Take 100 mg by mouth daily.   nitroGLYCERIN 0.4 MG SL tablet Commonly known as:  NITROSTAT Place 0.4 mg under the tongue every 5 (five) minutes as needed for chest pain.   pantoprazole 40 MG tablet Commonly known as:  PROTONIX Take 40 mg by mouth 2 (two) times daily.   polyethylene glycol packet Commonly known as:  MIRALAX / GLYCOLAX Take 17 g by mouth daily.   predniSONE 10 MG tablet Commonly known as:  DELTASONE Take Prednisone 30 mg on 02/13/2017, 20 mg on 02/14/2017, 10 mg on 02/15/2017, and 5 mg 03/08/2017, always take prednisone with food   PROAIR HFA 108 (90 Base) MCG/ACT inhaler Generic drug:  albuterol Inhale 1-2 puffs into the lungs every 4 (four) hours as needed for wheezing.   rosuvastatin 10 MG tablet Commonly known as:  CRESTOR Take 10 mg by mouth every evening.   tamsulosin 0.4 MG Caps capsule Commonly known as:  FLOMAX Take 0.4 mg by mouth daily.   topiramate 50 MG tablet Commonly known as:  TOPAMAX Take 50 mg by mouth 2 (two) times daily.   warfarin 5 MG tablet Commonly known as:  COUMADIN Take 5-7.5  mg by mouth See admin instructions. Take 7.5 mg on Monday, Wednesday and Friday then take 5 mg all the other days       Major procedures and Radiology Reports - PLEASE review detailed and final reports for all details, in brief -   Ct Angio Chest Pe W And/or Wo Contrast  Result Date: 02/10/2017 CLINICAL DATA:  Sharp chest pain radiating to the neck beginning this morning. History of pulmonary embolism, hypertension, coronary artery disease and stent and COPD. EXAM: CT ANGIOGRAPHY CHEST WITH CONTRAST TECHNIQUE: Multidetector CT imaging of the chest was performed using the standard protocol during bolus administration of intravenous contrast. Multiplanar CT image reconstructions and MIPs were obtained to evaluate the vascular anatomy. CONTRAST:  78 cc ISOVUE-370 IOPAMIDOL (ISOVUE-370) INJECTION 76% COMPARISON:  Chest radiograph February 10, 2017 at 1953 hours FINDINGS: CARDIOVASCULAR: Adequate contrast opacification of the pulmonary artery's. Main pulmonary artery is not enlarged. No pulmonary arterial filling defects to the level of the subsegmental branches. Heart size is normal. Moderate coronary artery calcifications. Small moderate pericardial effusion measuring to 9 mm in depth. Thoracic aorta is normal course and caliber, mild calcific atherosclerosis. MEDIASTINUM/NODES: No lymphadenopathy by CT size criteria. 9 mm RIGHT peritracheal lymph node. LUNGS/PLEURA: Tracheobronchial tree is patent, no pneumothorax. Mild bronchial wall thickening. No pleural effusions, focal consolidations, pulmonary nodules or masses. Bibasilar atelectasis/scarring. Faint tree-in-bud densities LEFT upper lobe. UPPER ABDOMEN: Included view of the abdomen is unremarkable. MUSCULOSKELETAL: Visualized soft tissues and included osseous structures are nonacute. Mild degenerative change of the thoracic spine. Review of the MIP images confirms the above findings. IMPRESSION: 1. No acute pulmonary embolism. 2. Moderate pericardial  effusion. 3. Bilateral upper lobe respiratory bronchiolitis.  No focal consolidation. 4. Bronchial wall thickening seen with bronchitis or reactive airway disease. Aortic Atherosclerosis (ICD10-I70.0). Electronically Signed   By: Awilda Metro M.D.   On: 02/10/2017 22:23   Dg Chest Port 1 View  Result Date: 02/10/2017 CLINICAL DATA:  Patient is from home, significant cardiac history, having sharp pain across his chest and radiates up his neck on both sides. Diagnosed with COPD last year. Patient does have a RBBB. Since 7a, patient has taken 7-8 SL nitro which has not helped. EXAM: PORTABLE CHEST 1 VIEW COMPARISON:  08/16/2016 FINDINGS: The heart size and mediastinal contours are within normal limits. Both lungs are clear. The visualized skeletal structures are unremarkable. IMPRESSION: No active disease. Electronically Signed   By: Norva Pavlov M.D.   On: 02/10/2017 20:20    Micro Results   No results found for this or any previous visit (from the past 240 hour(s)).     Today   Subjective    Marshaun Lortie today has no new complaints, except for constipation, no cp, no sob, no f/c,           Patient has been seen and examined prior to discharge   Objective   Blood pressure 105/63, pulse 78, temperature 98.4 F (36.9 C), temperature source Oral, resp. rate 20, height 5\' 7"  (1.702 m), weight 108.9 kg (240 lb), SpO2 95 %.   Intake/Output Summary (Last 24 hours) at 02/12/2017 1455 Last data filed at 02/12/2017 1015 Gross per 24 hour  Intake 478 ml  Output 900 ml  Net -422 ml    Exam Gen:- Awake  In no apparent distress  HEENT:- [place].AT,   Neck-Supple Neck,No JVD,  Lungs- mostly clear  CV- S1, S2 normal, 3/6 sm Abd-  +ve B.Sounds, Abd Soft, No tenderness,    Extremity/Skin:- Intact peripheral pulses, Trace pitting edema   Data Review   CBC w Diff:  Lab Results  Component Value Date   WBC 12.3 (H) 02/11/2017   HGB 13.4 02/11/2017   HCT 40.7 02/11/2017   PLT 215  02/11/2017   CMP:  Lab Results  Component Value Date   NA 135 02/12/2017   K 4.0 02/12/2017   CL 103 02/12/2017   CO2 27 02/12/2017   BUN 20 02/12/2017   CREATININE 0.77 02/12/2017    Total Discharge time is about 33 minutes  Shon Hale M.D on 02/12/2017 at 2:55 PM  [facility]   Office  [phone]  Voice Recognition Reubin Milan dictation system was used to create this note, attempts have been made to correct errors. Please contact the author with questions and/or clarifications.

## 2017-02-12 NOTE — Progress Notes (Signed)
Orders received for pt discharge.  Discharge summary printed and reviewed with pt.  Explained medication regimen, and pt had no further questions at this time.  IV removed and site remains clean, dry, intact.  Telemetry removed.  Pt in stable condition and awaiting transport. 

## 2017-02-12 NOTE — Plan of Care (Signed)
  Activity: Risk for activity intolerance will decrease 02/12/2017 0920 - Progressing by Loel LoftyLewis, Alexy Heldt P, RN

## 2017-02-12 NOTE — Care Management Note (Signed)
Case Management Note  Patient Details  Name: Jose [name] MRN: [id number] Date of Birth: 05/03/1955  Subjective/Objective:    CHF               Action/Plan: Primary Care Provider: Ephriam JenkinsFowler, Walter E, MD; has [facility] insurance with [employer] with prescription drug coverage; COPD on home oxygen; CM following for needs.  Expected Discharge Date:    possibly 02/12/2017              Expected Discharge Plan:  Home/Self Care  Discharge planning Services  CM Consult  Status of Service:  In process, will continue to follow  Reola MosherChandler, Verdis Bassette L, RN,MHA,BSN [phone] 02/12/2017, 10:40 AM

## 2017-02-12 NOTE — Progress Notes (Addendum)
ANTICOAGULATION CONSULT NOTE   Pharmacy Consult for warfarin  Indication: hx of antiphospholipid syndrome, hx DVT/PE  Vital Signs: Temp: 98.2 F (36.8 C) (11/30 0814) Temp Source: Oral (11/30 0814) BP: 107/45 (11/30 1017) Pulse Rate: 77 (11/30 1017)  Labs: Recent Labs    02/10/17 2024 02/11/17 0046 02/11/17 0337 02/12/17 0532  HGB 14.9 13.4  --   --   HCT 44.6 40.7  --   --   PLT 215 215  --   --   LABPROT 23.2*  --  24.8* 27.1*  INR 2.08  --  2.26 2.53  CREATININE 0.88  --   --  0.77    Estimated Creatinine Clearance: 114.1 mL/min (by C-G formula based on SCr of 0.77 mg/dL).  Assessment: 61 yo male admitted with chest pain, CT negative for PE. Suspected pericarditis- started on steroids and colchicine. To continue warfarin- PTA warfarin dose: 7.5mg  MWF and 5mg  all other days.  INR remains in range at 2.53 today.  Hgb and plts stable, no bleeding noted.  Of note: patient's steroid taper was not ordered correctly. He received both 50mg  and 40mg  tablets this morning (along with 50mg  last evening). I corrected the orders moving forward.  Goal of Therapy:  INR 2-3 Monitor platelets by anticoagulation protocol: Yes   Plan:  Warfarin 7.5mg  PO x 1 tonight as per home dose Daily INR for now CBC in AM  Ena Demary D. Bricen Victory, PharmD, BCPS Clinical Pharmacist Clinical Phone for 02/12/2017 until 3:30pm: [phone] If after 3:30pm, please call main pharmacy at [phone] 02/12/2017 12:14 PM

## 2017-03-31 DIAGNOSIS — I350 Nonrheumatic aortic (valve) stenosis: Secondary | ICD-10-CM | POA: Insufficient documentation

## 2017-08-19 DIAGNOSIS — Z951 Presence of aortocoronary bypass graft: Secondary | ICD-10-CM | POA: Insufficient documentation

## 2017-09-17 ENCOUNTER — Telehealth (HOSPITAL_COMMUNITY): Payer: Self-pay

## 2017-09-17 NOTE — Telephone Encounter (Signed)
Pt insurance is active and benefits verified through [employer]. Co-pay $0.00, DED $7,000/$0.00 met, out of pocket $7,900.00/$0.00 met, co-insurance 40%. No pre-authorization required. Passport, 09/17/17 @ 4:02PM, UPB#[id number]

## 2017-09-29 ENCOUNTER — Telehealth (HOSPITAL_COMMUNITY): Payer: Self-pay

## 2017-09-29 NOTE — Telephone Encounter (Signed)
Attempted to contact patient in regards to Cardiac Rehab - lm on vm °

## 2017-10-04 ENCOUNTER — Encounter: Payer: Self-pay | Admitting: *Deleted

## 2017-10-04 ENCOUNTER — Encounter: Payer: BLUE CROSS/BLUE SHIELD | Attending: Family Medicine | Admitting: *Deleted

## 2017-10-04 VITALS — Ht 67.1 in | Wt 228.3 lb

## 2017-10-04 DIAGNOSIS — E119 Type 2 diabetes mellitus without complications: Secondary | ICD-10-CM | POA: Diagnosis not present

## 2017-10-04 DIAGNOSIS — Z7982 Long term (current) use of aspirin: Secondary | ICD-10-CM | POA: Diagnosis not present

## 2017-10-04 DIAGNOSIS — Z87891 Personal history of nicotine dependence: Secondary | ICD-10-CM | POA: Insufficient documentation

## 2017-10-04 DIAGNOSIS — Z7901 Long term (current) use of anticoagulants: Secondary | ICD-10-CM | POA: Diagnosis not present

## 2017-10-04 DIAGNOSIS — Z79899 Other long term (current) drug therapy: Secondary | ICD-10-CM | POA: Insufficient documentation

## 2017-10-04 DIAGNOSIS — Z7951 Long term (current) use of inhaled steroids: Secondary | ICD-10-CM | POA: Insufficient documentation

## 2017-10-04 DIAGNOSIS — Z951 Presence of aortocoronary bypass graft: Secondary | ICD-10-CM | POA: Insufficient documentation

## 2017-10-04 DIAGNOSIS — I2510 Atherosclerotic heart disease of native coronary artery without angina pectoris: Secondary | ICD-10-CM | POA: Insufficient documentation

## 2017-10-04 DIAGNOSIS — D6861 Antiphospholipid syndrome: Secondary | ICD-10-CM | POA: Insufficient documentation

## 2017-10-04 DIAGNOSIS — I2699 Other pulmonary embolism without acute cor pulmonale: Secondary | ICD-10-CM | POA: Diagnosis not present

## 2017-10-04 DIAGNOSIS — J449 Chronic obstructive pulmonary disease, unspecified: Secondary | ICD-10-CM | POA: Diagnosis not present

## 2017-10-04 DIAGNOSIS — I10 Essential (primary) hypertension: Secondary | ICD-10-CM | POA: Diagnosis not present

## 2017-10-04 DIAGNOSIS — K219 Gastro-esophageal reflux disease without esophagitis: Secondary | ICD-10-CM | POA: Insufficient documentation

## 2017-10-04 DIAGNOSIS — Z79891 Long term (current) use of opiate analgesic: Secondary | ICD-10-CM | POA: Diagnosis not present

## 2017-10-04 DIAGNOSIS — H53129 Transient visual loss, unspecified eye: Secondary | ICD-10-CM | POA: Insufficient documentation

## 2017-10-04 NOTE — Progress Notes (Signed)
Daily Session Note  Patient Details  Name: Jose [name] MRN: [id number] Date of Birth: 19-Sep-1955 Referring Provider:     Cardiac Rehab from 10/04/2017 in [facility]  Referring Provider  Carmin Richmond MD      Encounter Date: 10/04/2017  Check In: Session Check In - 10/04/17 1335      Check-In   Location  [facility]    Staff Present  Alberteen Sam, MA, RCEP, CCRP, Exercise Physiologist;Meredith Sherryll Burger, RN BSN    Supervising physician immediately available to respond to emergencies  See telemetry face sheet for immediately available ER MD    Tobacco Cessation  No Change Quit 08/14/17    Warm-up and Cool-down  Not performed (comment) Med Review    Resistance Training Performed  Yes    VAD Patient?  No    PAD/SET Patient?  No      Pain Assessment   Currently in Pain?  No/denies        Exercise Prescription Changes - 10/04/17 1200      Response to Exercise   Blood Pressure (Admit)  132/66    Blood Pressure (Exercise)  138/64    Blood Pressure (Exit)  136/64    Heart Rate (Admit)  87 bpm    Heart Rate (Exercise)  118 bpm    Heart Rate (Exit)  90 bpm    Oxygen Saturation (Admit)  96 %    Oxygen Saturation (Exercise)  96 %    Rating of Perceived Exertion (Exercise)  13    Symptoms  none    Comments  walk test results       Social History   Tobacco Use  Smoking Status Former Smoker  . Packs/day: 0.50  . Types: Cigarettes  . Last attempt to quit: 08/14/2017  . Years since quitting: 0.1  Smokeless Tobacco Never Used    Goals Met:  Proper associated with RPD/PD & O2 Sat Exercise tolerated well Personal goals reviewed No report of cardiac concerns or symptoms Strength training completed today  Goals Unmet:  Not Applicable  Comments: Med Review completed   Dr. Emily Filbert is Medical Director for [facility].

## 2017-10-04 NOTE — Patient Instructions (Signed)
Patient Instructions  Patient Details  Name: Jose [name] MRN: [id number] Date of Birth: 02/26/1956 Referring Provider:  Orie FishermanMoore, Stephen, MD  Below are your personal goals for exercise, nutrition, and risk factors. Our goal is to help you stay on track towards obtaining and maintaining these goals. We will be discussing your progress on these goals with you throughout the program.  Initial Exercise Prescription: Initial Exercise Prescription - 10/04/17 1200      Date of Initial Exercise RX and Referring Provider   Date  10/04/17    Referring Provider  Gerrianne ScaleMoore, Eric Stephen MD      Treadmill   MPH  1.8    Grade  0.5    Minutes  15    METs  2.5      NuStep   Level  1    SPM  80    Minutes  15    METs  2.5      REL-XR   Level  1    Speed  50    Minutes  15    METs  2.5      Prescription Details   Frequency (times per week)  3    Duration  Progress to 45 minutes of aerobic exercise without signs/symptoms of physical distress      Intensity   THRR 40-80% of Max Heartrate  115-144    Ratings of Perceived Exertion  11-13    Perceived Dyspnea  0-4      Progression   Progression  Continue to progress workloads to maintain intensity without signs/symptoms of physical distress.      Resistance Training   Training Prescription  Yes    Weight  3 lbs    Reps  10-15       Exercise Goals: Frequency: Be able to perform aerobic exercise two to three times per week in program working toward 2-5 days per week of home exercise.  Intensity: Work with a perceived exertion of 11 (fairly light) - 15 (hard) while following your exercise prescription.  We will make changes to your prescription with you as you progress through the program.   Duration: Be able to do 30 to 45 minutes of continuous aerobic exercise in addition to a 5 minute warm-up and a 5 minute cool-down routine.   Nutrition Goals: Your personal nutrition goals will be established when you do your nutrition analysis with  the dietician.  The following are general nutrition guidelines to follow: Cholesterol < 200mg /day Sodium < 1500mg /day Fiber: Men over 50 yrs - 30 grams per day  Personal Goals: Personal Goals and Risk Factors at Admission - 10/04/17 1340      Core Components/Risk Factors/Patient Goals on Admission    Weight Management  Yes;Obesity;Weight Loss    Intervention  Weight Management: Develop a combined nutrition and exercise program designed to reach desired caloric intake, while maintaining appropriate intake of nutrient and fiber, sodium and fats, and appropriate energy expenditure required for the weight goal.;Weight Management: Provide education and appropriate resources to help participant work on and attain dietary goals.;Weight Management/Obesity: Establish reasonable short term and long term weight goals.;Obesity: Provide education and appropriate resources to help participant work on and attain dietary goals.    Admit Weight  228 lb (103.4 kg)    Goal Weight: Short Term  223 lb (101.2 kg)    Goal Weight: Long Term  200 lb (90.7 kg)    Expected Outcomes  Short Term: Continue to assess and modify interventions until short  term weight is achieved;Long Term: Adherence to nutrition and physical activity/exercise program aimed toward attainment of established weight goal;Weight Loss: Understanding of general recommendations for a balanced deficit meal plan, which promotes 1-2 lb weight loss per week and includes a negative energy balance of [phone] kcal/d;Understanding recommendations for meals to include 15-35% energy as protein, 25-35% energy from fat, 35-60% energy from carbohydrates, less than 200mg  of dietary cholesterol, 20-35 gm of total fiber daily;Understanding of distribution of calorie intake throughout the day with the consumption of 4-5 meals/snacks    Tobacco Cessation  Yes Quit 08/14/17    Intervention  Offer self-teaching materials, assist with locating and accessing local/national Quit  Smoking programs, and support quit date choice.;Assist the participant in steps to quit. Provide individualized education and counseling about committing to Tobacco Cessation, relapse prevention, and pharmacological support that can be provided by physician.    Expected Outcomes  Long Term: Complete abstinence from all tobacco products for at least 12 months from quit date.;Short Term: Will quit all tobacco product use, adhering to prevention of relapse plan.    Diabetes  Yes    Intervention  Provide education about signs/symptoms and action to take for hypo/hyperglycemia.;Provide education about proper nutrition, including hydration, and aerobic/resistive exercise prescription along with prescribed medications to achieve blood glucose in normal ranges: Fasting glucose 65-99 mg/dL    Expected Outcomes  Short Term: Participant verbalizes understanding of the signs/symptoms and immediate care of hyper/hypoglycemia, proper foot care and importance of medication, aerobic/resistive exercise and nutrition plan for blood glucose control.;Long Term: Attainment of HbA1C < 7%.    Hypertension  Yes    Intervention  Monitor prescription use compliance.;Provide education on lifestyle modifcations including regular physical activity/exercise, weight management, moderate sodium restriction and increased consumption of fresh fruit, vegetables, and low fat dairy, alcohol moderation, and smoking cessation.    Expected Outcomes  Short Term: Continued assessment and intervention until BP is < 140/87mm HG in hypertensive participants. < 130/46mm HG in hypertensive participants with diabetes, heart failure or chronic kidney disease.;Long Term: Maintenance of blood pressure at goal levels.    Lipids  Yes    Intervention  Provide education and support for participant on nutrition & aerobic/resistive exercise along with prescribed medications to achieve LDL 70mg , HDL >40mg .    Expected Outcomes  Short Term: Participant states  understanding of desired cholesterol values and is compliant with medications prescribed. Participant is following exercise prescription and nutrition guidelines.;Long Term: Cholesterol controlled with medications as prescribed, with individualized exercise RX and with personalized nutrition plan. Value goals: LDL < 70mg , HDL > 40 mg.       Tobacco Use Initial Evaluation: Social History   Tobacco Use  Smoking Status Former Smoker  . Packs/day: 0.50  . Types: Cigarettes  . Last attempt to quit: 08/14/2017  . Years since quitting: 0.1  Smokeless Tobacco Never Used    Exercise Goals and Review: Exercise Goals    Row Name 10/04/17 1244             Exercise Goals   Increase Physical Activity  Yes       Intervention  Provide advice, education, support and counseling about physical activity/exercise needs.;Develop an individualized exercise prescription for aerobic and resistive training based on initial evaluation findings, risk stratification, comorbidities and participant's personal goals.       Expected Outcomes  Short Term: Attend rehab on a regular basis to increase amount of physical activity.;Long Term: Add in home exercise to make exercise part  of routine and to increase amount of physical activity.;Long Term: Exercising regularly at least 3-5 days a week.       Increase Strength and Stamina  Yes       Intervention  Provide advice, education, support and counseling about physical activity/exercise needs.;Develop an individualized exercise prescription for aerobic and resistive training based on initial evaluation findings, risk stratification, comorbidities and participant's personal goals.       Expected Outcomes  Short Term: Increase workloads from initial exercise prescription for resistance, speed, and METs.;Short Term: Perform resistance training exercises routinely during rehab and add in resistance training at home;Long Term: Improve cardiorespiratory fitness, muscular endurance  and strength as measured by increased METs and functional capacity ( )       Able to understand and use rate of perceived exertion (RPE) scale  Yes       Intervention  Provide education and explanation on how to use RPE scale       Expected Outcomes  Short Term: Able to use RPE daily in rehab to express subjective intensity level;Long Term:  Able to use RPE to guide intensity level when exercising independently       Able to understand and use Dyspnea scale  Yes       Intervention  Provide education and explanation on how to use Dyspnea scale       Expected Outcomes  Short Term: Able to use Dyspnea scale daily in rehab to express subjective sense of shortness of breath during exertion;Long Term: Able to use Dyspnea scale to guide intensity level when exercising independently       Knowledge and understanding of Target Heart Rate Range (THRR)  Yes       Intervention  Provide education and explanation of THRR including how the numbers were predicted and where they are located for reference       Expected Outcomes  Short Term: Able to state/look up THRR;Short Term: Able to use daily as guideline for intensity in rehab;Long Term: Able to use THRR to govern intensity when exercising independently       Able to check pulse independently  Yes       Intervention  Provide education and demonstration on how to check pulse in carotid and radial arteries.;Review the importance of being able to check your own pulse for safety during independent exercise       Expected Outcomes  Short Term: Able to explain why pulse checking is important during independent exercise;Long Term: Able to check pulse independently and accurately       Understanding of Exercise Prescription  Yes       Intervention  Provide education, explanation, and written materials on patient's individual exercise prescription       Expected Outcomes  Short Term: Able to explain program exercise prescription;Long Term: Able to explain home exercise  prescription to exercise independently          Copy of goals given to participant.

## 2017-10-04 NOTE — Progress Notes (Signed)
Cardiac Individual Treatment Plan  Patient Details  Name: Jose [name] MRN: [id number] Date of Birth: 06/25/1955 Referring Provider:     Cardiac Rehab from 10/04/2017 in [facility] Cardiac and Pulmonary Rehab  Referring Provider  Carmin Richmond MD      Initial Encounter Date:    Cardiac Rehab from 10/04/2017 in [facility] Cardiac and Pulmonary Rehab  Date  10/04/17      Visit Diagnosis: S/P CABG x 3  Patient's Home Medications on Admission:  Current Outpatient Medications:  .  ALPRAZolam (XANAX) 0.5 MG tablet, TAKE ONE TABLET (0.5 MG TOTAL) BY MOUTH 3 (THREE) TIMES A DAY AS NEEDED FOR SLEEP OR ANXIETY., Disp: , Rfl:  .  aspirin EC 81 MG tablet, Take 81 mg by mouth daily., Disp: , Rfl:  .  Blood Glucose Monitoring Suppl (GLUCOCOM BLOOD GLUCOSE MONITOR) DEVI, 1 each by XX route as directed (For Blood Glucose Monitoring), Disp: , Rfl:  .  glucose blood (PRECISION QID TEST) test strip, Use 1 each as directed (For Blood Glucose Monitoring 4 times daily) Use as instructed., Disp: , Rfl:  .  metoprolol tartrate (LOPRESSOR) 12.5 mg TABS tablet, Take by mouth., Disp: , Rfl:  .  nitroGLYCERIN (NITROSTAT) 0.4 MG SL tablet, Place 0.4 mg under the tongue every 5 (five) minutes as needed for chest pain. , Disp: , Rfl: 1 .  pantoprazole (PROTONIX) 40 MG tablet, Take 40 mg by mouth 2 (two) times daily., Disp: , Rfl: 3 .  potassium chloride (MICRO-K) 10 MEQ CR capsule, Take by mouth., Disp: , Rfl:  .  PROAIR HFA 108 (90 Base) MCG/ACT inhaler, Inhale 1-2 puffs into the lungs every 4 (four) hours as needed for wheezing. , Disp: , Rfl: 12 .  rosuvastatin (CRESTOR) 10 MG tablet, Take 10 mg by mouth every evening., Disp: , Rfl: 11 .  tamsulosin (FLOMAX) 0.4 MG CAPS capsule, Take 0.4 mg by mouth daily., Disp: , Rfl: 0 .  Tiotropium Bromide-Olodaterol (STIOLTO RESPIMAT) 2.5-2.5 MCG/ACT AERS, Inhale into the lungs., Disp: , Rfl:  .  topiramate (TOPAMAX) 50 MG tablet, Take 50 mg by mouth 2 (two) times daily., Disp: ,  Rfl: 0 .  torsemide (DEMADEX) 20 MG tablet, Take by mouth., Disp: , Rfl:  .  warfarin (COUMADIN) 5 MG tablet, Take 5-7.5 mg by mouth See admin instructions. Take 7.5 mg on Monday, Wednesday and Friday then take 5 mg all the other days, Disp: , Rfl: 0 .  colchicine 0.6 MG tablet, Take 1 tablet (0.6 mg total) by mouth 2 (two) times daily. (Patient not taking: Reported on 10/04/2017), Disp: 60 tablet, Rfl: 2 .  furosemide (LASIX) 40 MG tablet, Take 1 tablet (40 mg total) by mouth daily. (Patient not taking: Reported on 10/04/2017), Disp: 30 tablet, Rfl: 2 .  isosorbide mononitrate (IMDUR) 60 MG 24 hr tablet, Take 1 tablet (60 mg total) by mouth daily. (Patient not taking: Reported on 10/04/2017), Disp: 30 tablet, Rfl: 1 .  metoprolol succinate (TOPROL-XL) 100 MG 24 hr tablet, Take 100 mg by mouth daily., Disp: , Rfl: 3 .  polyethylene glycol (MIRALAX / GLYCOLAX) packet, Take 17 g by mouth daily. (Patient not taking: Reported on 10/04/2017), Disp: 14 each, Rfl: 0  Past Medical History: Past Medical History:  Diagnosis Date  . Antiphospholipid antibody syndrome ([place])   . CAD S/P percutaneous coronary angioplasty '98, '04, Feb 2017   [facility]  . Carotid arterial disease ([place]) 10/2015   moderate bilateral  . COPD (chronic obstructive pulmonary disease) (  [place])   . Coronary artery disease   . Hypertension   . Non-insulin treated type 2 diabetes mellitus ([place])   . Pulmonary emboli ([place]) 2011  . RBBB   . Sleep apnea     Tobacco Use: Social History   Tobacco Use  Smoking Status Former Smoker  . Packs/day: 0.50  . Types: Cigarettes  . Last attempt to quit: 08/14/2017  . Years since quitting: 0.1  Smokeless Tobacco Never Used    Labs: Recent Review Scientist, physiological    Labs for ITP Cardiac and Pulmonary Rehab Latest Ref Rng & Units 08/16/2016   Cholestrol 0 - 200 mg/dL 156   LDLCALC 0 - 99 mg/dL 64   HDL >40 mg/dL 28(L)   Trlycerides <150 mg/dL 322(H)   Hemoglobin A1c 4.8 - 5.6 % 5.9(H)        Exercise Target Goals: Date: 10/04/17  Exercise Program Goal: Individual exercise prescription set using results from initial 6 min walk test and THRR while considering  patient's activity barriers and safety.   Exercise Prescription Goal: Initial exercise prescription builds to 30-45 minutes a day of aerobic activity, 2-3 days per week.  Home exercise guidelines will be given to patient during program as part of exercise prescription that the participant will acknowledge.  Activity Barriers & Risk Stratification: Activity Barriers & Cardiac Risk Stratification - 10/04/17 1241      Activity Barriers & Cardiac Risk Stratification   Activity Barriers  Balance Concerns;Deconditioning;Muscular Weakness;Other (comment)    Comments  Hematoma above right knee    Cardiac Risk Stratification  High       6 Minute Walk: 6 Minute Walk    Row Name 10/04/17 1241         6 Minute Walk   Phase  Initial     Distance  996 feet     Walk Time  6 minutes     # of Rest Breaks  0     MPH  1.89     METS  2.57     RPE  13     VO2 Peak  8.99     Symptoms  No     Resting HR  87 bpm     Resting BP  132/66     Resting Oxygen Saturation   96 %     Exercise Oxygen Saturation  during 6 min walk  96 %     Max Ex. HR  118 bpm     Max Ex. BP  138/64     2 Minute Post BP  136/64        Oxygen Initial Assessment:   Oxygen Re-Evaluation:   Oxygen Discharge (Final Oxygen Re-Evaluation):   Initial Exercise Prescription: Initial Exercise Prescription - 10/04/17 1200      Date of Initial Exercise RX and Referring Provider   Date  10/04/17    Referring Provider  Carmin Richmond MD      Treadmill   MPH  1.8    Grade  0.5    Minutes  15    METs  2.5      NuStep   Level  1    SPM  80    Minutes  15    METs  2.5      REL-XR   Level  1    Speed  50    Minutes  15    METs  2.5      Prescription Details   Frequency (times  per week)  3    Duration  Progress to 45 minutes of  aerobic exercise without signs/symptoms of physical distress      Intensity   THRR 40-80% of Max Heartrate  115-144    Ratings of Perceived Exertion  11-13    Perceived Dyspnea  0-4      Progression   Progression  Continue to progress workloads to maintain intensity without signs/symptoms of physical distress.      Resistance Training   Training Prescription  Yes    Weight  3 lbs    Reps  10-15       Perform Capillary Blood Glucose checks as needed.  Exercise Prescription Changes: Exercise Prescription Changes    Row Name 10/04/17 1200             Response to Exercise   Blood Pressure (Admit)  132/66       Blood Pressure (Exercise)  138/64       Blood Pressure (Exit)  136/64       Heart Rate (Admit)  87 bpm       Heart Rate (Exercise)  118 bpm       Heart Rate (Exit)  90 bpm       Oxygen Saturation (Admit)  96 %       Oxygen Saturation (Exercise)  96 %       Rating of Perceived Exertion (Exercise)  13       Symptoms  none       Comments  walk test results          Exercise Comments:   Exercise Goals and Review: Exercise Goals    Row Name 10/04/17 1244             Exercise Goals   Increase Physical Activity  Yes       Intervention  Provide advice, education, support and counseling about physical activity/exercise needs.;Develop an individualized exercise prescription for aerobic and resistive training based on initial evaluation findings, risk stratification, comorbidities and participant's personal goals.       Expected Outcomes  Short Term: Attend rehab on a regular basis to increase amount of physical activity.;Long Term: Add in home exercise to make exercise part of routine and to increase amount of physical activity.;Long Term: Exercising regularly at least 3-5 days a week.       Increase Strength and Stamina  Yes       Intervention  Provide advice, education, support and counseling about physical activity/exercise needs.;Develop an individualized  exercise prescription for aerobic and resistive training based on initial evaluation findings, risk stratification, comorbidities and participant's personal goals.       Expected Outcomes  Short Term: Increase workloads from initial exercise prescription for resistance, speed, and METs.;Short Term: Perform resistance training exercises routinely during rehab and add in resistance training at home;Long Term: Improve cardiorespiratory fitness, muscular endurance and strength as measured by increased METs and functional capacity (6MWT)       Able to understand and use rate of perceived exertion (RPE) scale  Yes       Intervention  Provide education and explanation on how to use RPE scale       Expected Outcomes  Short Term: Able to use RPE daily in rehab to express subjective intensity level;Long Term:  Able to use RPE to guide intensity level when exercising independently       Able to understand and use Dyspnea scale  Yes  Intervention  Provide education and explanation on how to use Dyspnea scale       Expected Outcomes  Short Term: Able to use Dyspnea scale daily in rehab to express subjective sense of shortness of breath during exertion;Long Term: Able to use Dyspnea scale to guide intensity level when exercising independently       Knowledge and understanding of Target Heart Rate Range (THRR)  Yes       Intervention  Provide education and explanation of THRR including how the numbers were predicted and where they are located for reference       Expected Outcomes  Short Term: Able to state/look up THRR;Short Term: Able to use daily as guideline for intensity in rehab;Long Term: Able to use THRR to govern intensity when exercising independently       Able to check pulse independently  Yes       Intervention  Provide education and demonstration on how to check pulse in carotid and radial arteries.;Review the importance of being able to check your own pulse for safety during independent exercise        Expected Outcomes  Short Term: Able to explain why pulse checking is important during independent exercise;Long Term: Able to check pulse independently and accurately       Understanding of Exercise Prescription  Yes       Intervention  Provide education, explanation, and written materials on patient's individual exercise prescription       Expected Outcomes  Short Term: Able to explain program exercise prescription;Long Term: Able to explain home exercise prescription to exercise independently          Exercise Goals Re-Evaluation :   Discharge Exercise Prescription (Final Exercise Prescription Changes): Exercise Prescription Changes - 10/04/17 1200      Response to Exercise   Blood Pressure (Admit)  132/66    Blood Pressure (Exercise)  138/64    Blood Pressure (Exit)  136/64    Heart Rate (Admit)  87 bpm    Heart Rate (Exercise)  118 bpm    Heart Rate (Exit)  90 bpm    Oxygen Saturation (Admit)  96 %    Oxygen Saturation (Exercise)  96 %    Rating of Perceived Exertion (Exercise)  13    Symptoms  none    Comments  walk test results       Nutrition:  Target Goals: Understanding of nutrition guidelines, daily intake of sodium <1535m, cholesterol <2053m calories 30% from fat and 7% or less from saturated fats, daily to have 5 or more servings of fruits and vegetables.  Biometrics: Pre Biometrics - 10/04/17 1245      Pre Biometrics   Height  5' 7.1" (1.704 m)    Weight  228 lb 4.8 oz (103.6 kg)    Waist Circumference  43 inches    Hip Circumference  47 inches    Waist to Hip Ratio  0.91 %    BMI (Calculated)  35.66    Single Leg Stand  1.16 seconds        Nutrition Therapy Plan and Nutrition Goals: Nutrition Therapy & Goals - 10/04/17 1341      Intervention Plan   Intervention  Prescribe, educate and counsel regarding individualized specific dietary modifications aiming towards targeted core components such as weight, hypertension, lipid management, diabetes,  heart failure and other comorbidities.;Nutrition handout(s) given to patient.    Expected Outcomes  Short Term Goal: Understand basic principles of dietary content, such as  calories, fat, sodium, cholesterol and nutrients.;Short Term Goal: A plan has been developed with personal nutrition goals set during dietitian appointment.;Long Term Goal: Adherence to prescribed nutrition plan.       Nutrition Assessments: Nutrition Assessments - 10/04/17 1342      MEDFICTS Scores   Pre Score  93       Nutrition Goals Re-Evaluation:   Nutrition Goals Discharge (Final Nutrition Goals Re-Evaluation):   Psychosocial: Target Goals: Acknowledge presence or absence of significant depression and/or stress, maximize coping skills, provide positive support system. Participant is able to verbalize types and ability to use techniques and skills needed for reducing stress and depression.   Initial Review & Psychosocial Screening: Initial Psych Review & Screening - 10/04/17 1346      Initial Review   Current issues with  Current Stress Concerns;Current Sleep Concerns Hard time staying asleep. Some nights he takes 2 Tylenol pms and 0.5 xanax at night and he still cant stay asleep. He also is up for another sleep study because they said he is retaining too much CO2 with his current CPAP    Source of Stress Concerns  Chronic Illness      Family Dynamics   Good Support System?  Yes wife      Barriers   Psychosocial barriers to participate in program  There are no identifiable barriers or psychosocial needs.;The patient should benefit from training in stress management and relaxation.      Screening Interventions   Interventions  Encouraged to exercise;Provide feedback about the scores to participant;Program counselor consult;To provide support and resources with identified psychosocial needs    Expected Outcomes  Short Term goal: Utilizing psychosocial counselor, staff and physician to assist with  identification of specific Stressors or current issues interfering with healing process. Setting desired goal for each stressor or current issue identified.;Long Term Goal: Stressors or current issues are controlled or eliminated.;Short Term goal: Identification and review with participant of any Quality of Life or Depression concerns found by scoring the questionnaire.;Long Term goal: The participant improves quality of Life and PHQ9 Scores as seen by post scores and/or verbalization of changes       Quality of Life Scores:  Quality of Life - 10/04/17 1049      Quality of Life   Select  Quality of Life      Quality of Life Scores   Health/Function Pre  7.47 %    Socioeconomic Pre  15.86 %    Psych/Spiritual Pre  6.25 %    Family Pre  8.4 %    GLOBAL Pre  9.17 %      Scores of 19 and below usually indicate a poorer quality of life in these areas.  A difference of  2-3 points is a clinically meaningful difference.  A difference of 2-3 points in the total score of the Quality of Life Index has been associated with significant improvement in overall quality of life, self-image, physical symptoms, and general health in studies assessing change in quality of life.  PHQ-9: Recent Review Flowsheet Data    Depression screen [facility] 2/9 10/04/2017   Decreased Interest 1   Down, Depressed, Hopeless 1   PHQ - 2 Score 2   Altered sleeping 2   Tired, decreased energy 3   Change in appetite 1   Feeling bad or failure about yourself  1   Trouble concentrating 0   Moving slowly or fidgety/restless 0   Suicidal thoughts 0   PHQ-9 Score 9  Difficult doing work/chores Somewhat difficult     Interpretation of Total Score  Total Score Depression Severity:  1-4 = Minimal depression, 5-9 = Mild depression, 10-14 = Moderate depression, 15-19 = Moderately severe depression, 20-27 = Severe depression   Psychosocial Evaluation and Intervention:   Psychosocial Re-Evaluation:   Psychosocial Discharge  (Final Psychosocial Re-Evaluation):   Vocational Rehabilitation: Provide vocational rehab assistance to qualifying candidates.   Vocational Rehab Evaluation & Intervention: Vocational Rehab - 10/04/17 1350      Initial Vocational Rehab Evaluation & Intervention   Assessment shows need for Vocational Rehabilitation  No       Education: Education Goals: Education classes will be provided on a variety of topics geared toward better understanding of heart health and risk factor modification. Participant will state understanding/return demonstration of topics presented as noted by education test scores.  Learning Barriers/Preferences: Learning Barriers/Preferences - 10/04/17 1350      Learning Barriers/Preferences   Learning Barriers  None    Learning Preferences  Individual Instruction;Verbal Instruction       Education Topics:  AED/CPR: - Group verbal and written instruction with the use of models to demonstrate the basic use of the AED with the basic ABC's of resuscitation.   General Nutrition Guidelines/Fats and Fiber: -Group instruction provided by verbal, written material, models and posters to present the general guidelines for heart healthy nutrition. Gives an explanation and review of dietary fats and fiber.   Controlling Sodium/Reading Food Labels: -Group verbal and written material supporting the discussion of sodium use in heart healthy nutrition. Review and explanation with models, verbal and written materials for utilization of the food label.   Exercise Physiology & General Exercise Guidelines: - Group verbal and written instruction with models to review the exercise physiology of the cardiovascular system and associated critical values. Provides general exercise guidelines with specific guidelines to those with heart or lung disease.    Aerobic Exercise & Resistance Training: - Gives group verbal and written instruction on the various components of exercise.  Focuses on aerobic and resistive training programs and the benefits of this training and how to safely progress through these programs..   Flexibility, Balance, Mind/Body Relaxation: Provides group verbal/written instruction on the benefits of flexibility and balance training, including mind/body exercise modes such as yoga, pilates and tai chi.  Demonstration and skill practice provided.   Stress and Anxiety: - Provides group verbal and written instruction about the health risks of elevated stress and causes of high stress.  Discuss the correlation between heart/lung disease and anxiety and treatment options. Review healthy ways to manage with stress and anxiety.   Depression: - Provides group verbal and written instruction on the correlation between heart/lung disease and depressed mood, treatment options, and the stigmas associated with seeking treatment.   Anatomy & Physiology of the Heart: - Group verbal and written instruction and models provide basic cardiac anatomy and physiology, with the coronary electrical and arterial systems. Review of Valvular disease and Heart Failure   Cardiac Procedures: - Group verbal and written instruction to review commonly prescribed medications for heart disease. Reviews the medication, class of the drug, and side effects. Includes the steps to properly store meds and maintain the prescription regimen. (beta blockers and nitrates)   Cardiac Medications I: - Group verbal and written instruction to review commonly prescribed medications for heart disease. Reviews the medication, class of the drug, and side effects. Includes the steps to properly store meds and maintain the prescription regimen.  Cardiac Medications II: -Group verbal and written instruction to review commonly prescribed medications for heart disease. Reviews the medication, class of the drug, and side effects. (all other drug classes)    Go Sex-Intimacy & Heart Disease, Get SMART  - Goal Setting: - Group verbal and written instruction through game format to discuss heart disease and the return to sexual intimacy. Provides group verbal and written material to discuss and apply goal setting through the application of the S.M.A.R.T. Method.   Other Matters of the Heart: - Provides group verbal, written materials and models to describe Stable Angina and Peripheral Artery. Includes description of the disease process and treatment options available to the cardiac patient.   Exercise & Equipment Safety: - Individual verbal instruction and demonstration of equipment use and safety with use of the equipment.   Cardiac Rehab from 10/04/2017 in [facility] Cardiac and Pulmonary Rehab  Date  10/04/17  Educator  [facility]  Instruction Review Code  1- Verbalizes Understanding      Infection Prevention: - Provides verbal and written material to individual with discussion of infection control including proper hand washing and proper equipment cleaning during exercise session.   Cardiac Rehab from 10/04/2017 in [facility] Cardiac and Pulmonary Rehab  Date  10/04/17  Educator  [facility]  Instruction Review Code  1- Verbalizes Understanding      Falls Prevention: - Provides verbal and written material to individual with discussion of falls prevention and safety.   Cardiac Rehab from 10/04/2017 in [facility] Cardiac and Pulmonary Rehab  Date  10/04/17  Educator  [facility]  Instruction Review Code  1- Verbalizes Understanding      Diabetes: - Individual verbal and written instruction to review signs/symptoms of diabetes, desired ranges of glucose level fasting, after meals and with exercise. Acknowledge that pre and post exercise glucose checks will be done for 3 sessions at entry of program.   Cardiac Rehab from 10/04/2017 in [facility] Cardiac and Pulmonary Rehab  Date  10/04/17  Educator  [facility]  Instruction Review Code  1- Verbalizes Understanding      Know Your Numbers and Risk Factors: -Group verbal and written  instruction about important numbers in your health.  Discussion of what are risk factors and how they play a role in the disease process.  Review of Cholesterol, Blood Pressure, Diabetes, and BMI and the role they play in your overall health.   Sleep Hygiene: -Provides group verbal and written instruction about how sleep can affect your health.  Define sleep hygiene, discuss sleep cycles and impact of sleep habits. Review good sleep hygiene tips.    Other: -Provides group and verbal instruction on various topics (see comments)   Knowledge Questionnaire Score: Knowledge Questionnaire Score - 10/04/17 1049      Knowledge Questionnaire Score   Pre Score  25/26 Test reviewed with pt today.  Education Focus: Angina       Core Components/Risk Factors/Patient Goals at Admission: Personal Goals and Risk Factors at Admission - 10/04/17 1340      Core Components/Risk Factors/Patient Goals on Admission    Weight Management  Yes;Obesity;Weight Loss    Intervention  Weight Management: Develop a combined nutrition and exercise program designed to reach desired caloric intake, while maintaining appropriate intake of nutrient and fiber, sodium and fats, and appropriate energy expenditure required for the weight goal.;Weight Management: Provide education and appropriate resources to help participant work on and attain dietary goals.;Weight Management/Obesity: Establish reasonable short term and long term weight goals.;Obesity: Provide education and  appropriate resources to help participant work on and attain dietary goals.    Admit Weight  228 lb (103.4 kg)    Goal Weight: Short Term  223 lb (101.2 kg)    Goal Weight: Long Term  200 lb (90.7 kg)    Expected Outcomes  Short Term: Continue to assess and modify interventions until short term weight is achieved;Long Term: Adherence to nutrition and physical activity/exercise program aimed toward attainment of established weight goal;Weight Loss: Understanding  of general recommendations for a balanced deficit meal plan, which promotes 1-2 lb weight loss per week and includes a negative energy balance of [phone] kcal/d;Understanding recommendations for meals to include 15-35% energy as protein, 25-35% energy from fat, 35-60% energy from carbohydrates, less than 247m of dietary cholesterol, 20-35 gm of total fiber daily;Understanding of distribution of calorie intake throughout the day with the consumption of 4-5 meals/snacks    Tobacco Cessation  Yes Quit 08/14/17    Intervention  Offer self-teaching materials, assist with locating and accessing local/national Quit Smoking programs, and support quit date choice.;Assist the participant in steps to quit. Provide individualized education and counseling about committing to Tobacco Cessation, relapse prevention, and pharmacological support that can be provided by physician.    Expected Outcomes  Long Term: Complete abstinence from all tobacco products for at least 12 months from quit date.;Short Term: Will quit all tobacco product use, adhering to prevention of relapse plan.    Diabetes  Yes    Intervention  Provide education about signs/symptoms and action to take for hypo/hyperglycemia.;Provide education about proper nutrition, including hydration, and aerobic/resistive exercise prescription along with prescribed medications to achieve blood glucose in normal ranges: Fasting glucose 65-99 mg/dL    Expected Outcomes  Short Term: Participant verbalizes understanding of the signs/symptoms and immediate care of hyper/hypoglycemia, proper foot care and importance of medication, aerobic/resistive exercise and nutrition plan for blood glucose control.;Long Term: Attainment of HbA1C < 7%.    Hypertension  Yes    Intervention  Monitor prescription use compliance.;Provide education on lifestyle modifcations including regular physical activity/exercise, weight management, moderate sodium restriction and increased consumption of  fresh fruit, vegetables, and low fat dairy, alcohol moderation, and smoking cessation.    Expected Outcomes  Short Term: Continued assessment and intervention until BP is < 140/930mHG in hypertensive participants. < 130/8051mG in hypertensive participants with diabetes, heart failure or chronic kidney disease.;Long Term: Maintenance of blood pressure at goal levels.    Lipids  Yes    Intervention  Provide education and support for participant on nutrition & aerobic/resistive exercise along with prescribed medications to achieve LDL <59m6mDL >40mg40m Expected Outcomes  Short Term: Participant states understanding of desired cholesterol values and is compliant with medications prescribed. Participant is following exercise prescription and nutrition guidelines.;Long Term: Cholesterol controlled with medications as prescribed, with individualized exercise RX and with personalized nutrition plan. Value goals: LDL < 59mg,75m > 40 mg.       Core Components/Risk Factors/Patient Goals Review:    Core Components/Risk Factors/Patient Goals at Discharge (Final Review):    ITP Comments: ITP Comments    Row Name 10/04/17 1338           ITP Comments  Med Review completed. Initial ITP created. Diagnosis can be found in [facility] 6/5          Comments: Initial ITP

## 2017-10-06 ENCOUNTER — Encounter: Payer: BLUE CROSS/BLUE SHIELD | Admitting: *Deleted

## 2017-10-06 DIAGNOSIS — Z951 Presence of aortocoronary bypass graft: Secondary | ICD-10-CM

## 2017-10-06 LAB — GLUCOSE, CAPILLARY
Glucose-Capillary: 87 mg/dL (ref 70–99)
Glucose-Capillary: 109 mg/dL — ABNORMAL HIGH (ref 70–99)

## 2017-10-06 NOTE — Progress Notes (Signed)
Cardiac Individual Treatment Plan  Patient Details  Name: Jose [name] MRN: [id number] Date of Birth: June 27, 1955 Referring Provider:     Cardiac Rehab from 10/04/2017 in [facility] Cardiac and Pulmonary Rehab  Referring Provider  Carmin Richmond MD      Initial Encounter Date:    Cardiac Rehab from 10/04/2017 in [facility] Cardiac and Pulmonary Rehab  Date  10/04/17      Visit Diagnosis: S/P CABG x 3  Patient's Home Medications on Admission:  Current Outpatient Medications:  .  ALPRAZolam (XANAX) 0.5 MG tablet, TAKE ONE TABLET (0.5 MG TOTAL) BY MOUTH 3 (THREE) TIMES A DAY AS NEEDED FOR SLEEP OR ANXIETY., Disp: , Rfl:  .  aspirin EC 81 MG tablet, Take 81 mg by mouth daily., Disp: , Rfl:  .  Blood Glucose Monitoring Suppl (GLUCOCOM BLOOD GLUCOSE MONITOR) DEVI, 1 each by XX route as directed (For Blood Glucose Monitoring), Disp: , Rfl:  .  colchicine 0.6 MG tablet, Take 1 tablet (0.6 mg total) by mouth 2 (two) times daily. (Patient not taking: Reported on 10/04/2017), Disp: 60 tablet, Rfl: 2 .  furosemide (LASIX) 40 MG tablet, Take 1 tablet (40 mg total) by mouth daily. (Patient not taking: Reported on 10/04/2017), Disp: 30 tablet, Rfl: 2 .  glucose blood (PRECISION QID TEST) test strip, Use 1 each as directed (For Blood Glucose Monitoring 4 times daily) Use as instructed., Disp: , Rfl:  .  isosorbide mononitrate (IMDUR) 60 MG 24 hr tablet, Take 1 tablet (60 mg total) by mouth daily. (Patient not taking: Reported on 10/04/2017), Disp: 30 tablet, Rfl: 1 .  metoprolol succinate (TOPROL-XL) 100 MG 24 hr tablet, Take 100 mg by mouth daily., Disp: , Rfl: 3 .  metoprolol tartrate (LOPRESSOR) 12.5 mg TABS tablet, Take by mouth., Disp: , Rfl:  .  nitroGLYCERIN (NITROSTAT) 0.4 MG SL tablet, Place 0.4 mg under the tongue every 5 (five) minutes as needed for chest pain. , Disp: , Rfl: 1 .  pantoprazole (PROTONIX) 40 MG tablet, Take 40 mg by mouth 2 (two) times daily., Disp: , Rfl: 3 .  polyethylene glycol  (MIRALAX / GLYCOLAX) packet, Take 17 g by mouth daily. (Patient not taking: Reported on 10/04/2017), Disp: 14 each, Rfl: 0 .  potassium chloride (MICRO-K) 10 MEQ CR capsule, Take by mouth., Disp: , Rfl:  .  PROAIR HFA 108 (90 Base) MCG/ACT inhaler, Inhale 1-2 puffs into the lungs every 4 (four) hours as needed for wheezing. , Disp: , Rfl: 12 .  rosuvastatin (CRESTOR) 10 MG tablet, Take 10 mg by mouth every evening., Disp: , Rfl: 11 .  tamsulosin (FLOMAX) 0.4 MG CAPS capsule, Take 0.4 mg by mouth daily., Disp: , Rfl: 0 .  Tiotropium Bromide-Olodaterol (STIOLTO RESPIMAT) 2.5-2.5 MCG/ACT AERS, Inhale into the lungs., Disp: , Rfl:  .  topiramate (TOPAMAX) 50 MG tablet, Take 50 mg by mouth 2 (two) times daily., Disp: , Rfl: 0 .  torsemide (DEMADEX) 20 MG tablet, Take by mouth., Disp: , Rfl:  .  warfarin (COUMADIN) 5 MG tablet, Take 5-7.5 mg by mouth See admin instructions. Take 7.5 mg on Monday, Wednesday and Friday then take 5 mg all the other days, Disp: , Rfl: 0  Past Medical History: Past Medical History:  Diagnosis Date  . Antiphospholipid antibody syndrome ([place])   . CAD S/P percutaneous coronary angioplasty '98, '04, Feb 2017   [facility]  . Carotid arterial disease ([place]) 10/2015   moderate bilateral  . COPD (chronic obstructive pulmonary disease) (  [place])   . Coronary artery disease   . Hypertension   . Non-insulin treated type 2 diabetes mellitus ([place])   . Pulmonary emboli ([place]) 2011  . RBBB   . Sleep apnea     Tobacco Use: Social History   Tobacco Use  Smoking Status Former Smoker  . Packs/day: 0.50  . Types: Cigarettes  . Last attempt to quit: 08/14/2017  . Years since quitting: 0.1  Smokeless Tobacco Never Used    Labs: Recent Review Scientist, physiological    Labs for ITP Cardiac and Pulmonary Rehab Latest Ref Rng & Units 08/16/2016   Cholestrol 0 - 200 mg/dL 156   LDLCALC 0 - 99 mg/dL 64   HDL >40 mg/dL 28(L)   Trlycerides <150 mg/dL 322(H)   Hemoglobin A1c 4.8 - 5.6 % 5.9(H)        Exercise Target Goals:    Exercise Program Goal: Individual exercise prescription set using results from initial 6 min walk test and THRR while considering  patient's activity barriers and safety.   Exercise Prescription Goal: Initial exercise prescription builds to 30-45 minutes a day of aerobic activity, 2-3 days per week.  Home exercise guidelines will be given to patient during program as part of exercise prescription that the participant will acknowledge.  Activity Barriers & Risk Stratification: Activity Barriers & Cardiac Risk Stratification - 10/04/17 1241      Activity Barriers & Cardiac Risk Stratification   Activity Barriers  Balance Concerns;Deconditioning;Muscular Weakness;Other (comment)    Comments  Hematoma above right knee    Cardiac Risk Stratification  High       6 Minute Walk: 6 Minute Walk    Row Name 10/04/17 1241         6 Minute Walk   Phase  Initial     Distance  996 feet     Walk Time  6 minutes     # of Rest Breaks  0     MPH  1.89     METS  2.57     RPE  13     VO2 Peak  8.99     Symptoms  No     Resting HR  87 bpm     Resting BP  132/66     Resting Oxygen Saturation   96 %     Exercise Oxygen Saturation  during 6 min walk  96 %     Max Ex. HR  118 bpm     Max Ex. BP  138/64     2 Minute Post BP  136/64        Oxygen Initial Assessment:   Oxygen Re-Evaluation:   Oxygen Discharge (Final Oxygen Re-Evaluation):   Initial Exercise Prescription: Initial Exercise Prescription - 10/04/17 1200      Date of Initial Exercise RX and Referring Provider   Date  10/04/17    Referring Provider  Carmin Richmond MD      Treadmill   MPH  1.8    Grade  0.5    Minutes  15    METs  2.5      NuStep   Level  1    SPM  80    Minutes  15    METs  2.5      REL-XR   Level  1    Speed  50    Minutes  15    METs  2.5      Prescription Details   Frequency (times  per week)  3    Duration  Progress to 45 minutes of aerobic  exercise without signs/symptoms of physical distress      Intensity   THRR 40-80% of Max Heartrate  115-144    Ratings of Perceived Exertion  11-13    Perceived Dyspnea  0-4      Progression   Progression  Continue to progress workloads to maintain intensity without signs/symptoms of physical distress.      Resistance Training   Training Prescription  Yes    Weight  3 lbs    Reps  10-15       Perform Capillary Blood Glucose checks as needed.  Exercise Prescription Changes: Exercise Prescription Changes    Row Name 10/04/17 1200             Response to Exercise   Blood Pressure (Admit)  132/66       Blood Pressure (Exercise)  138/64       Blood Pressure (Exit)  136/64       Heart Rate (Admit)  87 bpm       Heart Rate (Exercise)  118 bpm       Heart Rate (Exit)  90 bpm       Oxygen Saturation (Admit)  96 %       Oxygen Saturation (Exercise)  96 %       Rating of Perceived Exertion (Exercise)  13       Symptoms  none       Comments  walk test results          Exercise Comments: Exercise Comments    Row Name 10/06/17 1636           Exercise Comments  First full day of exercise!  Patient was oriented to gym and equipment including functions, settings, policies, and procedures.  Patient's individual exercise prescription and treatment plan were reviewed.  All starting workloads were established based on the results of the 6 minute walk test done at initial orientation visit.  The plan for exercise progression was also introduced and progression will be customized based on patient's performance and goals.          Exercise Goals and Review: Exercise Goals    Row Name 10/04/17 1244             Exercise Goals   Increase Physical Activity  Yes       Intervention  Provide advice, education, support and counseling about physical activity/exercise needs.;Develop an individualized exercise prescription for aerobic and resistive training based on initial evaluation  findings, risk stratification, comorbidities and participant's personal goals.       Expected Outcomes  Short Term: Attend rehab on a regular basis to increase amount of physical activity.;Long Term: Add in home exercise to make exercise part of routine and to increase amount of physical activity.;Long Term: Exercising regularly at least 3-5 days a week.       Increase Strength and Stamina  Yes       Intervention  Provide advice, education, support and counseling about physical activity/exercise needs.;Develop an individualized exercise prescription for aerobic and resistive training based on initial evaluation findings, risk stratification, comorbidities and participant's personal goals.       Expected Outcomes  Short Term: Increase workloads from initial exercise prescription for resistance, speed, and METs.;Short Term: Perform resistance training exercises routinely during rehab and add in resistance training at home;Long Term: Improve cardiorespiratory fitness, muscular endurance and strength as measured by  increased METs and functional capacity (6MWT)       Able to understand and use rate of perceived exertion (RPE) scale  Yes       Intervention  Provide education and explanation on how to use RPE scale       Expected Outcomes  Short Term: Able to use RPE daily in rehab to express subjective intensity level;Long Term:  Able to use RPE to guide intensity level when exercising independently       Able to understand and use Dyspnea scale  Yes       Intervention  Provide education and explanation on how to use Dyspnea scale       Expected Outcomes  Short Term: Able to use Dyspnea scale daily in rehab to express subjective sense of shortness of breath during exertion;Long Term: Able to use Dyspnea scale to guide intensity level when exercising independently       Knowledge and understanding of Target Heart Rate Range (THRR)  Yes       Intervention  Provide education and explanation of THRR including how  the numbers were predicted and where they are located for reference       Expected Outcomes  Short Term: Able to state/look up THRR;Short Term: Able to use daily as guideline for intensity in rehab;Long Term: Able to use THRR to govern intensity when exercising independently       Able to check pulse independently  Yes       Intervention  Provide education and demonstration on how to check pulse in carotid and radial arteries.;Review the importance of being able to check your own pulse for safety during independent exercise       Expected Outcomes  Short Term: Able to explain why pulse checking is important during independent exercise;Long Term: Able to check pulse independently and accurately       Understanding of Exercise Prescription  Yes       Intervention  Provide education, explanation, and written materials on patient's individual exercise prescription       Expected Outcomes  Short Term: Able to explain program exercise prescription;Long Term: Able to explain home exercise prescription to exercise independently          Exercise Goals Re-Evaluation :   Discharge Exercise Prescription (Final Exercise Prescription Changes): Exercise Prescription Changes - 10/04/17 1200      Response to Exercise   Blood Pressure (Admit)  132/66    Blood Pressure (Exercise)  138/64    Blood Pressure (Exit)  136/64    Heart Rate (Admit)  87 bpm    Heart Rate (Exercise)  118 bpm    Heart Rate (Exit)  90 bpm    Oxygen Saturation (Admit)  96 %    Oxygen Saturation (Exercise)  96 %    Rating of Perceived Exertion (Exercise)  13    Symptoms  none    Comments  walk test results       Nutrition:  Target Goals: Understanding of nutrition guidelines, daily intake of sodium '1500mg'$ , cholesterol '200mg'$ , calories 30% from fat and 7% or less from saturated fats, daily to have 5 or more servings of fruits and vegetables.  Biometrics: Pre Biometrics - 10/04/17 1245      Pre Biometrics   Height  5' 7.1"  (1.704 m)    Weight  228 lb 4.8 oz (103.6 kg)    Waist Circumference  43 inches    Hip Circumference  47 inches    Waist  to Hip Ratio  0.91 %    BMI (Calculated)  35.66    Single Leg Stand  1.16 seconds        Nutrition Therapy Plan and Nutrition Goals: Nutrition Therapy & Goals - 10/04/17 1341      Intervention Plan   Intervention  Prescribe, educate and counsel regarding individualized specific dietary modifications aiming towards targeted core components such as weight, hypertension, lipid management, diabetes, heart failure and other comorbidities.;Nutrition handout(s) given to patient.    Expected Outcomes  Short Term Goal: Understand basic principles of dietary content, such as calories, fat, sodium, cholesterol and nutrients.;Short Term Goal: A plan has been developed with personal nutrition goals set during dietitian appointment.;Long Term Goal: Adherence to prescribed nutrition plan.       Nutrition Assessments: Nutrition Assessments - 10/04/17 1342      MEDFICTS Scores   Pre Score  93       Nutrition Goals Re-Evaluation:   Nutrition Goals Discharge (Final Nutrition Goals Re-Evaluation):   Psychosocial: Target Goals: Acknowledge presence or absence of significant depression and/or stress, maximize coping skills, provide positive support system. Participant is able to verbalize types and ability to use techniques and skills needed for reducing stress and depression.   Initial Review & Psychosocial Screening: Initial Psych Review & Screening - 10/04/17 1346      Initial Review   Current issues with  Current Stress Concerns;Current Sleep Concerns Hard time staying asleep. Some nights he takes 2 Tylenol pms and 0.5 xanax at night and he still cant stay asleep. He also is up for another sleep study because they said he is retaining too much CO2 with his current CPAP    Source of Stress Concerns  Chronic Illness      Family Dynamics   Good Support System?  Yes wife       Barriers   Psychosocial barriers to participate in program  There are no identifiable barriers or psychosocial needs.;The patient should benefit from training in stress management and relaxation.      Screening Interventions   Interventions  Encouraged to exercise;Provide feedback about the scores to participant;Program counselor consult;To provide support and resources with identified psychosocial needs    Expected Outcomes  Short Term goal: Utilizing psychosocial counselor, staff and physician to assist with identification of specific Stressors or current issues interfering with healing process. Setting desired goal for each stressor or current issue identified.;Long Term Goal: Stressors or current issues are controlled or eliminated.;Short Term goal: Identification and review with participant of any Quality of Life or Depression concerns found by scoring the questionnaire.;Long Term goal: The participant improves quality of Life and PHQ9 Scores as seen by post scores and/or verbalization of changes       Quality of Life Scores:  Quality of Life - 10/04/17 1049      Quality of Life   Select  Quality of Life      Quality of Life Scores   Health/Function Pre  7.47 %    Socioeconomic Pre  15.86 %    Psych/Spiritual Pre  6.25 %    Family Pre  8.4 %    GLOBAL Pre  9.17 %      Scores of 19 and below usually indicate a poorer quality of life in these areas.  A difference of  2-3 points is a clinically meaningful difference.  A difference of 2-3 points in the total score of the Quality of Life Index has been associated with significant improvement  in overall quality of life, self-image, physical symptoms, and general health in studies assessing change in quality of life.  PHQ-9: Recent Review Flowsheet Data    Depression screen [facility] 2/9 10/04/2017   Decreased Interest 1   Down, Depressed, Hopeless 1   PHQ - 2 Score 2   Altered sleeping 2   Tired, decreased energy 3   Change in appetite 1    Feeling bad or failure about yourself  1   Trouble concentrating 0   Moving slowly or fidgety/restless 0   Suicidal thoughts 0   PHQ-9 Score 9   Difficult doing work/chores Somewhat difficult     Interpretation of Total Score  Total Score Depression Severity:  1-4 = Minimal depression, 5-9 = Mild depression, 10-14 = Moderate depression, 15-19 = Moderately severe depression, 20-27 = Severe depression   Psychosocial Evaluation and Intervention:   Psychosocial Re-Evaluation:   Psychosocial Discharge (Final Psychosocial Re-Evaluation):   Vocational Rehabilitation: Provide vocational rehab assistance to qualifying candidates.   Vocational Rehab Evaluation & Intervention: Vocational Rehab - 10/04/17 1350      Initial Vocational Rehab Evaluation & Intervention   Assessment shows need for Vocational Rehabilitation  No       Education: Education Goals: Education classes will be provided on a variety of topics geared toward better understanding of heart health and risk factor modification. Participant will state understanding/return demonstration of topics presented as noted by education test scores.  Learning Barriers/Preferences: Learning Barriers/Preferences - 10/04/17 1350      Learning Barriers/Preferences   Learning Barriers  None    Learning Preferences  Individual Instruction;Verbal Instruction       Education Topics:  AED/CPR: - Group verbal and written instruction with the use of models to demonstrate the basic use of the AED with the basic ABC's of resuscitation.   General Nutrition Guidelines/Fats and Fiber: -Group instruction provided by verbal, written material, models and posters to present the general guidelines for heart healthy nutrition. Gives an explanation and review of dietary fats and fiber.   Controlling Sodium/Reading Food Labels: -Group verbal and written material supporting the discussion of sodium use in heart healthy nutrition. Review and  explanation with models, verbal and written materials for utilization of the food label.   Exercise Physiology & General Exercise Guidelines: - Group verbal and written instruction with models to review the exercise physiology of the cardiovascular system and associated critical values. Provides general exercise guidelines with specific guidelines to those with heart or lung disease.    Aerobic Exercise & Resistance Training: - Gives group verbal and written instruction on the various components of exercise. Focuses on aerobic and resistive training programs and the benefits of this training and how to safely progress through these programs..   Flexibility, Balance, Mind/Body Relaxation: Provides group verbal/written instruction on the benefits of flexibility and balance training, including mind/body exercise modes such as yoga, pilates and tai chi.  Demonstration and skill practice provided.   Stress and Anxiety: - Provides group verbal and written instruction about the health risks of elevated stress and causes of high stress.  Discuss the correlation between heart/lung disease and anxiety and treatment options. Review healthy ways to manage with stress and anxiety.   Depression: - Provides group verbal and written instruction on the correlation between heart/lung disease and depressed mood, treatment options, and the stigmas associated with seeking treatment.   Anatomy & Physiology of the Heart: - Group verbal and written instruction and models provide basic cardiac anatomy and  physiology, with the coronary electrical and arterial systems. Review of Valvular disease and Heart Failure   Cardiac Procedures: - Group verbal and written instruction to review commonly prescribed medications for heart disease. Reviews the medication, class of the drug, and side effects. Includes the steps to properly store meds and maintain the prescription regimen. (beta blockers and nitrates)   Cardiac  Medications I: - Group verbal and written instruction to review commonly prescribed medications for heart disease. Reviews the medication, class of the drug, and side effects. Includes the steps to properly store meds and maintain the prescription regimen.   Cardiac Medications II: -Group verbal and written instruction to review commonly prescribed medications for heart disease. Reviews the medication, class of the drug, and side effects. (all other drug classes)    Go Sex-Intimacy & Heart Disease, Get SMART - Goal Setting: - Group verbal and written instruction through game format to discuss heart disease and the return to sexual intimacy. Provides group verbal and written material to discuss and apply goal setting through the application of the S.M.A.R.T. Method.   Other Matters of the Heart: - Provides group verbal, written materials and models to describe Stable Angina and Peripheral Artery. Includes description of the disease process and treatment options available to the cardiac patient.   Exercise & Equipment Safety: - Individual verbal instruction and demonstration of equipment use and safety with use of the equipment.   Cardiac Rehab from 10/06/2017 in [facility] Cardiac and Pulmonary Rehab  Date  10/06/17  Educator  Nada Maclachlan  Instruction Review Code  1- Verbalizes Understanding      Infection Prevention: - Provides verbal and written material to individual with discussion of infection control including proper hand washing and proper equipment cleaning during exercise session.   Cardiac Rehab from 10/06/2017 in [facility] Cardiac and Pulmonary Rehab  Date  10/04/17  Educator  [facility]  Instruction Review Code  1- Verbalizes Understanding      Falls Prevention: - Provides verbal and written material to individual with discussion of falls prevention and safety.   Cardiac Rehab from 10/06/2017 in [facility] Cardiac and Pulmonary Rehab  Date  10/04/17  Educator  [facility]  Instruction Review Code  1-  Verbalizes Understanding      Diabetes: - Individual verbal and written instruction to review signs/symptoms of diabetes, desired ranges of glucose level fasting, after meals and with exercise. Acknowledge that pre and post exercise glucose checks will be done for 3 sessions at entry of program.   Cardiac Rehab from 10/06/2017 in [facility] Cardiac and Pulmonary Rehab  Date  10/04/17  Educator  [facility]  Instruction Review Code  1- Verbalizes Understanding      Know Your Numbers and Risk Factors: -Group verbal and written instruction about important numbers in your health.  Discussion of what are risk factors and how they play a role in the disease process.  Review of Cholesterol, Blood Pressure, Diabetes, and BMI and the role they play in your overall health.   Sleep Hygiene: -Provides group verbal and written instruction about how sleep can affect your health.  Define sleep hygiene, discuss sleep cycles and impact of sleep habits. Review good sleep hygiene tips.    Cardiac Rehab from 10/06/2017 in [facility] Cardiac and Pulmonary Rehab  Date  10/06/17  Educator  Lucianne Lei, MSW  Instruction Review Code  1- Verbalizes Understanding      Other: -Provides group and verbal instruction on various topics (see comments)   Knowledge Questionnaire Score: Knowledge Questionnaire  Score - 10/04/17 1049      Knowledge Questionnaire Score   Pre Score  25/26 Test reviewed with pt today.  Education Focus: Angina       Core Components/Risk Factors/Patient Goals at Admission: Personal Goals and Risk Factors at Admission - 10/04/17 1340      Core Components/Risk Factors/Patient Goals on Admission    Weight Management  Yes;Obesity;Weight Loss    Intervention  Weight Management: Develop a combined nutrition and exercise program designed to reach desired caloric intake, while maintaining appropriate intake of nutrient and fiber, sodium and fats, and appropriate energy expenditure required for the weight  goal.;Weight Management: Provide education and appropriate resources to help participant work on and attain dietary goals.;Weight Management/Obesity: Establish reasonable short term and long term weight goals.;Obesity: Provide education and appropriate resources to help participant work on and attain dietary goals.    Admit Weight  228 lb (103.4 kg)    Goal Weight: Short Term  223 lb (101.2 kg)    Goal Weight: Long Term  200 lb (90.7 kg)    Expected Outcomes  Short Term: Continue to assess and modify interventions until short term weight is achieved;Long Term: Adherence to nutrition and physical activity/exercise program aimed toward attainment of established weight goal;Weight Loss: Understanding of general recommendations for a balanced deficit meal plan, which promotes 1-2 lb weight loss per week and includes a negative energy balance of [id number] kcal/d;Understanding recommendations for meals to include 15-35% energy as protein, 25-35% energy from fat, 35-60% energy from carbohydrates, less than '200mg'$  of dietary cholesterol, 20-35 gm of total fiber daily;Understanding of distribution of calorie intake throughout the day with the consumption of 4-5 meals/snacks    Tobacco Cessation  Yes Quit 08/14/17    Intervention  Offer self-teaching materials, assist with locating and accessing local/national Quit Smoking programs, and support quit date choice.;Assist the participant in steps to quit. Provide individualized education and counseling about committing to Tobacco Cessation, relapse prevention, and pharmacological support that can be provided by physician.    Expected Outcomes  Long Term: Complete abstinence from all tobacco products for at least 12 months from quit date.;Short Term: Will quit all tobacco product use, adhering to prevention of relapse plan.    Diabetes  Yes    Intervention  Provide education about signs/symptoms and action to take for hypo/hyperglycemia.;Provide education about proper  nutrition, including hydration, and aerobic/resistive exercise prescription along with prescribed medications to achieve blood glucose in normal ranges: Fasting glucose 65-99 mg/dL    Expected Outcomes  Short Term: Participant verbalizes understanding of the signs/symptoms and immediate care of hyper/hypoglycemia, proper foot care and importance of medication, aerobic/resistive exercise and nutrition plan for blood glucose control.;Long Term: Attainment of HbA1C < 7%.    Hypertension  Yes    Intervention  Monitor prescription use compliance.;Provide education on lifestyle modifcations including regular physical activity/exercise, weight management, moderate sodium restriction and increased consumption of fresh fruit, vegetables, and low fat dairy, alcohol moderation, and smoking cessation.    Expected Outcomes  Short Term: Continued assessment and intervention until BP is < 140/11m HG in hypertensive participants. < 130/842mHG in hypertensive participants with diabetes, heart failure or chronic kidney disease.;Long Term: Maintenance of blood pressure at goal levels.    Lipids  Yes    Intervention  Provide education and support for participant on nutrition & aerobic/resistive exercise along with prescribed medications to achieve LDL '70mg'$ , HDL >'40mg'$ .    Expected Outcomes  Short Term: Participant states understanding of  desired cholesterol values and is compliant with medications prescribed. Participant is following exercise prescription and nutrition guidelines.;Long Term: Cholesterol controlled with medications as prescribed, with individualized exercise RX and with personalized nutrition plan. Value goals: LDL < '70mg'$ , HDL > 40 mg.       Core Components/Risk Factors/Patient Goals Review:    Core Components/Risk Factors/Patient Goals at Discharge (Final Review):    ITP Comments: ITP Comments    Row Name 10/04/17 1338           ITP Comments  Med Review completed. Initial ITP created.  Diagnosis can be found in [facility] 6/5          Comments:

## 2017-10-06 NOTE — Progress Notes (Signed)
Daily Session Note  Patient Details  Name: Jose [name] MRN: [id number] Date of Birth: 09-Mar-1956 Referring Provider:     Cardiac Rehab from 10/04/2017 in [facility] Cardiac and Pulmonary Rehab  Referring Provider  Carmin Richmond MD      Encounter Date: 10/06/2017  Check In: Session Check In - 10/06/17 1633      Check-In   Location  [facility]-Cardiac & Pulmonary Rehab    Staff Present  Gerlene Burdock, RN, BSN;Meredith Sherryll Burger, RN Vickki Hearing, BA, ACSM CEP, Exercise Physiologist    Supervising physician immediately available to respond to emergencies  See telemetry face sheet for immediately available ER MD    Medication changes reported      No    Fall or balance concerns reported     No    Tobacco Cessation  No Change    Warm-up and Cool-down  Performed on first and last piece of equipment    Resistance Training Performed  Yes    VAD Patient?  No    PAD/SET Patient?  No      Pain Assessment   Currently in Pain?  No/denies          Social History   Tobacco Use  Smoking Status Former Smoker  . Packs/day: 0.50  . Types: Cigarettes  . Last attempt to quit: 08/14/2017  . Years since quitting: 0.1  Smokeless Tobacco Never Used    Goals Met:  Proper associated with RPD/PD & O2 Sat Exercise tolerated well No report of cardiac concerns or symptoms Strength training completed today  Goals Unmet:  Not Applicable  Comments:  First full day of exercise!  Patient was oriented to gym and equipment including functions, settings, policies, and procedures.  Patient's individual exercise prescription and treatment plan were reviewed.  All starting workloads were established based on the results of the 6 minute walk test done at initial orientation visit.  The plan for exercise progression was also introduced and progression will be customized based on patient's performance and goals.    Dr. Emily Filbert is Medical Director for [place] and [facility]  Pulmonary Rehabilitation.

## 2017-10-07 DIAGNOSIS — Z951 Presence of aortocoronary bypass graft: Secondary | ICD-10-CM

## 2017-10-07 LAB — GLUCOSE, CAPILLARY
Glucose-Capillary: 106 mg/dL — ABNORMAL HIGH (ref 70–99)
Glucose-Capillary: 98 mg/dL (ref 70–99)

## 2017-10-07 NOTE — Progress Notes (Signed)
Daily Session Note  Patient Details  Name: Jose [name] MRN: [id number] Date of Birth: [birth year] Referring Provider:     Cardiac Rehab from 10/04/2017 in [facility] Cardiac and Pulmonary Rehab  Referring Provider  Carmin Richmond MD      Encounter Date: 10/07/2017  Check In: Session Check In - 10/07/17 1631      Check-In   Supervising physician immediately available to respond to emergencies  See telemetry face sheet for immediately available ER MD    Location  [facility]-Cardiac & Pulmonary Rehab    Staff Present  Justin Mend Jaci Carrel, BS, ACSM CEP, Exercise Physiologist;Meredith Sherryll Burger, RN BSN    Medication changes reported      No    Fall or balance concerns reported     No    Tobacco Cessation  No Change    Warm-up and Cool-down  Performed on first and last piece of equipment    Resistance Training Performed  Yes    VAD Patient?  No      Pain Assessment   Currently in Pain?  No/denies          Social History   Tobacco Use  Smoking Status Former Smoker  . Packs/day: 0.50  . Types: Cigarettes  . Last attempt to quit: 08/14/2017  . Years since quitting: 0.1  Smokeless Tobacco Never Used    Goals Met:  Independence with exercise equipment Exercise tolerated well No report of cardiac concerns or symptoms Strength training completed today  Goals Unmet:  Not Applicable  Comments: Pt able to follow exercise prescription today without complaint.  Will continue to monitor for progression.   Dr. Emily Filbert is Medical Director for [place] and [facility].

## 2017-10-11 DIAGNOSIS — Z951 Presence of aortocoronary bypass graft: Secondary | ICD-10-CM

## 2017-10-11 LAB — GLUCOSE, CAPILLARY
GLUCOSE-CAPILLARY: 90 mg/dL (ref 70–99)
Glucose-Capillary: 94 mg/dL (ref 70–99)

## 2017-10-11 NOTE — Progress Notes (Signed)
Daily Session Note  Patient Details  Name: Mariana Goytia MRN: [id number] Date of Birth: 05-07-1955 Referring Provider:     Cardiac Rehab from 10/04/2017 in [facility] Cardiac and Pulmonary Rehab  Referring Provider  Carmin Richmond MD      Encounter Date: 10/11/2017  Check In: Session Check In - 10/11/17 1732      Check-In   Supervising physician immediately available to respond to emergencies  See telemetry face sheet for immediately available ER MD    Location  [facility]-Cardiac & Pulmonary Rehab    Staff Present  Gerlene Burdock, RN, Moises Blood, BS, ACSM CEP, Exercise Physiologist;Sontee Desena Oletta Darter, BA, ACSM CEP, Exercise Physiologist    Medication changes reported      No    Fall or balance concerns reported     No    Warm-up and Cool-down  Performed on first and last piece of equipment    Resistance Training Performed  Yes    VAD Patient?  No    PAD/SET Patient?  No      Pain Assessment   Currently in Pain?  No/denies    Multiple Pain Sites  No          Social History   Tobacco Use  Smoking Status Former Smoker  . Packs/day: 0.50  . Types: Cigarettes  . Last attempt to quit: 08/14/2017  . Years since quitting: 0.1  Smokeless Tobacco Never Used    Goals Met:  Independence with exercise equipment Exercise tolerated well No report of cardiac concerns or symptoms Strength training completed today  Goals Unmet:  Not Applicable  Comments: Pt able to follow exercise prescription today without complaint.  Will continue to monitor for progression.    Dr. Emily Filbert is Medical Director for [place] and [facility].

## 2017-10-13 ENCOUNTER — Encounter: Payer: BLUE CROSS/BLUE SHIELD | Admitting: *Deleted

## 2017-10-13 DIAGNOSIS — Z951 Presence of aortocoronary bypass graft: Secondary | ICD-10-CM | POA: Diagnosis not present

## 2017-10-13 NOTE — Progress Notes (Signed)
Daily Session Note  Patient Details  Name: Jose [name] MRN: [id number] Date of Birth: 1955/10/08 Referring Provider:     Cardiac Rehab from 10/04/2017 in [facility] Cardiac and Pulmonary Rehab  Referring Provider  Carmin Richmond MD      Encounter Date: 10/13/2017  Check In: Session Check In - 10/13/17 1658      Check-In   Supervising physician immediately available to respond to emergencies  See telemetry face sheet for immediately available ER MD    Location  [facility]-Cardiac & Pulmonary Rehab    Staff Present  Gerlene Burdock, RN, BSN;Meredith Sherryll Burger, RN Vickki Hearing, BA, ACSM CEP, Exercise Physiologist    Medication changes reported      No    Fall or balance concerns reported     No    Tobacco Cessation  No Change    Warm-up and Cool-down  Performed as group-led instruction    Resistance Training Performed  Yes    VAD Patient?  No      Pain Assessment   Currently in Pain?  No/denies          Social History   Tobacco Use  Smoking Status Former Smoker  . Packs/day: 0.50  . Types: Cigarettes  . Last attempt to quit: 08/14/2017  . Years since quitting: 0.1  Smokeless Tobacco Never Used    Goals Met:  Independence with exercise equipment Exercise tolerated well No report of cardiac concerns or symptoms Strength training completed today  Goals Unmet:  Not Applicable  Comments: Pt able to follow exercise prescription today without complaint.  Will continue to monitor for progression.    Dr. Emily Filbert is Medical Director for [place] and [facility].

## 2017-10-14 ENCOUNTER — Encounter: Payer: BLUE CROSS/BLUE SHIELD | Attending: Family Medicine

## 2017-10-14 DIAGNOSIS — J449 Chronic obstructive pulmonary disease, unspecified: Secondary | ICD-10-CM | POA: Diagnosis not present

## 2017-10-14 DIAGNOSIS — Z7982 Long term (current) use of aspirin: Secondary | ICD-10-CM | POA: Diagnosis not present

## 2017-10-14 DIAGNOSIS — Z87891 Personal history of nicotine dependence: Secondary | ICD-10-CM | POA: Diagnosis not present

## 2017-10-14 DIAGNOSIS — I2699 Other pulmonary embolism without acute cor pulmonale: Secondary | ICD-10-CM | POA: Diagnosis not present

## 2017-10-14 DIAGNOSIS — Z951 Presence of aortocoronary bypass graft: Secondary | ICD-10-CM | POA: Insufficient documentation

## 2017-10-14 DIAGNOSIS — I10 Essential (primary) hypertension: Secondary | ICD-10-CM | POA: Insufficient documentation

## 2017-10-14 DIAGNOSIS — Z79891 Long term (current) use of opiate analgesic: Secondary | ICD-10-CM | POA: Insufficient documentation

## 2017-10-14 DIAGNOSIS — Z7901 Long term (current) use of anticoagulants: Secondary | ICD-10-CM | POA: Diagnosis not present

## 2017-10-14 DIAGNOSIS — D6861 Antiphospholipid syndrome: Secondary | ICD-10-CM | POA: Diagnosis not present

## 2017-10-14 DIAGNOSIS — Z79899 Other long term (current) drug therapy: Secondary | ICD-10-CM | POA: Insufficient documentation

## 2017-10-14 DIAGNOSIS — Z7951 Long term (current) use of inhaled steroids: Secondary | ICD-10-CM | POA: Diagnosis not present

## 2017-10-14 DIAGNOSIS — E119 Type 2 diabetes mellitus without complications: Secondary | ICD-10-CM | POA: Diagnosis not present

## 2017-10-14 NOTE — Progress Notes (Signed)
Daily Session Note  Patient Details  Name: Jose [name] MRN: [id number] Date of Birth: 1955/10/07 Referring Provider:     Cardiac Rehab from 10/04/2017 in [facility] Cardiac and Pulmonary Rehab  Referring Provider  Carmin Richmond MD      Encounter Date: 10/14/2017  Check In: Session Check In - 10/14/17 1629      Check-In   Supervising physician immediately available to respond to emergencies  See telemetry face sheet for immediately available ER MD    Location  [facility]-Cardiac & Pulmonary Rehab    Staff Present  Justin Mend Jaci Carrel, BS, ACSM CEP, Exercise Physiologist;Meredith Sherryll Burger, RN BSN    Medication changes reported      No    Fall or balance concerns reported     No    Warm-up and Cool-down  Performed on first and last piece of equipment    Resistance Training Performed  Yes    VAD Patient?  No      Pain Assessment   Currently in Pain?  No/denies          Social History   Tobacco Use  Smoking Status Former Smoker  . Packs/day: 0.50  . Types: Cigarettes  . Last attempt to quit: 08/14/2017  . Years since quitting: 0.1  Smokeless Tobacco Never Used    Goals Met:  Independence with exercise equipment Exercise tolerated well No report of cardiac concerns or symptoms Strength training completed today  Goals Unmet:  Not Applicable  Comments: Pt able to follow exercise prescription today without complaint.  Will continue to monitor for progression.   Dr. Emily Filbert is Medical Director for [place] and [facility].

## 2017-10-18 DIAGNOSIS — Z951 Presence of aortocoronary bypass graft: Secondary | ICD-10-CM | POA: Diagnosis not present

## 2017-10-18 NOTE — Progress Notes (Signed)
Daily Session Note  Patient Details  Name: Lamar Meter MRN: [id number] Date of Birth: [birth year] Referring Provider:     Cardiac Rehab from 10/04/2017 in [facility] Cardiac and Pulmonary Rehab  Referring Provider  Carmin Richmond MD      Encounter Date: 10/18/2017  Check In: Session Check In - 10/18/17 1702      Check-In   Supervising physician immediately available to respond to emergencies  See telemetry face sheet for immediately available ER MD    Location  [facility]-Cardiac & Pulmonary Rehab    Staff Present  Gerlene Burdock, RN, Moises Blood, BS, ACSM CEP, Exercise Physiologist;Lilinoe Acklin Oletta Darter, BA, ACSM CEP, Exercise Physiologist    Warm-up and Cool-down  Performed on first and last piece of equipment    Resistance Training Performed  Yes    VAD Patient?  No    PAD/SET Patient?  No      Pain Assessment   Currently in Pain?  No/denies    Multiple Pain Sites  No          Social History   Tobacco Use  Smoking Status Former Smoker  . Packs/day: 0.50  . Types: Cigarettes  . Last attempt to quit: 08/14/2017  . Years since quitting: 0.1  Smokeless Tobacco Never Used    Goals Met:  Independence with exercise equipment Exercise tolerated well No report of cardiac concerns or symptoms Strength training completed today  Goals Unmet:  Not Applicable  Comments: Pt able to follow exercise prescription today without complaint.  Will continue to monitor for progression.    Dr. Emily Filbert is Medical Director for [place] and [facility].

## 2017-10-21 DIAGNOSIS — Z951 Presence of aortocoronary bypass graft: Secondary | ICD-10-CM

## 2017-10-21 NOTE — Progress Notes (Signed)
Daily Session Note  Patient Details  Name: Jose [name] MRN: [id number] Date of Birth: 1956-03-05 Referring Provider:     Cardiac Rehab from 10/04/2017 in [facility] Cardiac and Pulmonary Rehab  Referring Provider  Carmin Richmond MD      Encounter Date: 10/21/2017  Check In:      Social History   Tobacco Use  Smoking Status Former Smoker  . Packs/day: 0.50  . Types: Cigarettes  . Last attempt to quit: 08/14/2017  . Years since quitting: 0.1  Smokeless Tobacco Never Used    Goals Met:  Improved SOB with ADL's Exercise tolerated well No report of cardiac concerns or symptoms Strength training completed today  Goals Unmet:  Not Applicable  Comments: Pt able to follow exercise prescription today without complaint.  Will continue to monitor for progression.   Dr. Emily Filbert is Medical Director for [place] and [facility].

## 2017-10-27 ENCOUNTER — Encounter: Payer: Self-pay | Admitting: *Deleted

## 2017-10-27 DIAGNOSIS — Z951 Presence of aortocoronary bypass graft: Secondary | ICD-10-CM

## 2017-10-27 NOTE — Progress Notes (Signed)
Cardiac Individual Treatment Plan  Patient Details  Name: Jose [name] MRN: [id number] Date of Birth: June 27, 1955 Referring Provider:     Cardiac Rehab from 10/04/2017 in [facility] Cardiac and Pulmonary Rehab  Referring Provider  Carmin Richmond MD      Initial Encounter Date:    Cardiac Rehab from 10/04/2017 in [facility] Cardiac and Pulmonary Rehab  Date  10/04/17      Visit Diagnosis: S/P CABG x 3  Patient's Home Medications on Admission:  Current Outpatient Medications:  .  ALPRAZolam (XANAX) 0.5 MG tablet, TAKE ONE TABLET (0.5 MG TOTAL) BY MOUTH 3 (THREE) TIMES A DAY AS NEEDED FOR SLEEP OR ANXIETY., Disp: , Rfl:  .  aspirin EC 81 MG tablet, Take 81 mg by mouth daily., Disp: , Rfl:  .  Blood Glucose Monitoring Suppl (GLUCOCOM BLOOD GLUCOSE MONITOR) DEVI, 1 each by XX route as directed (For Blood Glucose Monitoring), Disp: , Rfl:  .  colchicine 0.6 MG tablet, Take 1 tablet (0.6 mg total) by mouth 2 (two) times daily. (Patient not taking: Reported on 10/04/2017), Disp: 60 tablet, Rfl: 2 .  furosemide (LASIX) 40 MG tablet, Take 1 tablet (40 mg total) by mouth daily. (Patient not taking: Reported on 10/04/2017), Disp: 30 tablet, Rfl: 2 .  glucose blood (PRECISION QID TEST) test strip, Use 1 each as directed (For Blood Glucose Monitoring 4 times daily) Use as instructed., Disp: , Rfl:  .  isosorbide mononitrate (IMDUR) 60 MG 24 hr tablet, Take 1 tablet (60 mg total) by mouth daily. (Patient not taking: Reported on 10/04/2017), Disp: 30 tablet, Rfl: 1 .  metoprolol succinate (TOPROL-XL) 100 MG 24 hr tablet, Take 100 mg by mouth daily., Disp: , Rfl: 3 .  metoprolol tartrate (LOPRESSOR) 12.5 mg TABS tablet, Take by mouth., Disp: , Rfl:  .  nitroGLYCERIN (NITROSTAT) 0.4 MG SL tablet, Place 0.4 mg under the tongue every 5 (five) minutes as needed for chest pain. , Disp: , Rfl: 1 .  pantoprazole (PROTONIX) 40 MG tablet, Take 40 mg by mouth 2 (two) times daily., Disp: , Rfl: 3 .  polyethylene glycol  (MIRALAX / GLYCOLAX) packet, Take 17 g by mouth daily. (Patient not taking: Reported on 10/04/2017), Disp: 14 each, Rfl: 0 .  potassium chloride (MICRO-K) 10 MEQ CR capsule, Take by mouth., Disp: , Rfl:  .  PROAIR HFA 108 (90 Base) MCG/ACT inhaler, Inhale 1-2 puffs into the lungs every 4 (four) hours as needed for wheezing. , Disp: , Rfl: 12 .  rosuvastatin (CRESTOR) 10 MG tablet, Take 10 mg by mouth every evening., Disp: , Rfl: 11 .  tamsulosin (FLOMAX) 0.4 MG CAPS capsule, Take 0.4 mg by mouth daily., Disp: , Rfl: 0 .  Tiotropium Bromide-Olodaterol (STIOLTO RESPIMAT) 2.5-2.5 MCG/ACT AERS, Inhale into the lungs., Disp: , Rfl:  .  topiramate (TOPAMAX) 50 MG tablet, Take 50 mg by mouth 2 (two) times daily., Disp: , Rfl: 0 .  torsemide (DEMADEX) 20 MG tablet, Take by mouth., Disp: , Rfl:  .  warfarin (COUMADIN) 5 MG tablet, Take 5-7.5 mg by mouth See admin instructions. Take 7.5 mg on Monday, Wednesday and Friday then take 5 mg all the other days, Disp: , Rfl: 0  Past Medical History: Past Medical History:  Diagnosis Date  . Antiphospholipid antibody syndrome ([place])   . CAD S/P percutaneous coronary angioplasty '98, '04, Feb 2017   [facility]  . Carotid arterial disease ([place]) 10/2015   moderate bilateral  . COPD (chronic obstructive pulmonary disease) (  [place])   . Coronary artery disease   . Hypertension   . Non-insulin treated type 2 diabetes mellitus ([place])   . Pulmonary emboli ([place]) 2011  . RBBB   . Sleep apnea     Tobacco Use: Social History   Tobacco Use  Smoking Status Former Smoker  . Packs/day: 0.50  . Types: Cigarettes  . Last attempt to quit: 08/14/2017  . Years since quitting: 0.2  Smokeless Tobacco Never Used    Labs: Recent Review Scientist, physiological    Labs for ITP Cardiac and Pulmonary Rehab Latest Ref Rng & Units 08/16/2016   Cholestrol 0 - 200 mg/dL 156   LDLCALC 0 - 99 mg/dL 64   HDL >40 mg/dL 28(L)   Trlycerides <150 mg/dL 322(H)   Hemoglobin A1c 4.8 - 5.6 % 5.9(H)        Exercise Target Goals: Exercise Program Goal: Individual exercise prescription set using results from initial 6 min walk test and THRR while considering  patient's activity barriers and safety.   Exercise Prescription Goal: Initial exercise prescription builds to 30-45 minutes a day of aerobic activity, 2-3 days per week.  Home exercise guidelines will be given to patient during program as part of exercise prescription that the participant will acknowledge.  Activity Barriers & Risk Stratification: Activity Barriers & Cardiac Risk Stratification - 10/04/17 1241      Activity Barriers & Cardiac Risk Stratification   Activity Barriers  Balance Concerns;Deconditioning;Muscular Weakness;Other (comment)    Comments  Hematoma above right knee    Cardiac Risk Stratification  High       6 Minute Walk: 6 Minute Walk    Row Name 10/04/17 1241         6 Minute Walk   Phase  Initial     Distance  996 feet     Walk Time  6 minutes     # of Rest Breaks  0     MPH  1.89     METS  2.57     RPE  13     VO2 Peak  8.99     Symptoms  No     Resting HR  87 bpm     Resting BP  132/66     Resting Oxygen Saturation   96 %     Exercise Oxygen Saturation  during 6 min walk  96 %     Max Ex. HR  118 bpm     Max Ex. BP  138/64     2 Minute Post BP  136/64        Oxygen Initial Assessment:   Oxygen Re-Evaluation:   Oxygen Discharge (Final Oxygen Re-Evaluation):   Initial Exercise Prescription: Initial Exercise Prescription - 10/04/17 1200      Date of Initial Exercise RX and Referring Provider   Date  10/04/17    Referring Provider  Carmin Richmond MD      Treadmill   MPH  1.8    Grade  0.5    Minutes  15    METs  2.5      NuStep   Level  1    SPM  80    Minutes  15    METs  2.5      REL-XR   Level  1    Speed  50    Minutes  15    METs  2.5      Prescription Details   Frequency (times per week)  3    Duration  Progress to 45 minutes of aerobic exercise  without signs/symptoms of physical distress      Intensity   THRR 40-80% of Max Heartrate  115-144    Ratings of Perceived Exertion  11-13    Perceived Dyspnea  0-4      Progression   Progression  Continue to progress workloads to maintain intensity without signs/symptoms of physical distress.      Resistance Training   Training Prescription  Yes    Weight  3 lbs    Reps  10-15       Perform Capillary Blood Glucose checks as needed.  Exercise Prescription Changes: Exercise Prescription Changes    Row Name 10/04/17 1200 10/19/17 1500           Response to Exercise   Blood Pressure (Admit)  132/66  100/58      Blood Pressure (Exercise)  138/64  150/70      Blood Pressure (Exit)  136/64  100/60      Heart Rate (Admit)  87 bpm  97 bpm      Heart Rate (Exercise)  118 bpm  115 bpm      Heart Rate (Exit)  90 bpm  103 bpm      Oxygen Saturation (Admit)  96 %  -      Oxygen Saturation (Exercise)  96 %  -      Rating of Perceived Exertion (Exercise)  13  14      Symptoms  none  none      Comments  walk test results  -      Duration  -  Progress to 45 minutes of aerobic exercise without signs/symptoms of physical distress      Intensity  -  THRR unchanged        Progression   Progression  -  Continue to progress workloads to maintain intensity without signs/symptoms of physical distress.      Average METs  -  2.4        Resistance Training   Training Prescription  -  Yes      Weight  -  3 lb      Reps  -  10-15        Treadmill   MPH  -  1.8      Grade  -  0.5      Minutes  -  15      METs  -  2.5        REL-XR   Level  -  1      Speed  -  50      Minutes  -  15      METs  -  2.3         Exercise Comments: Exercise Comments    Row Name 10/06/17 1636           Exercise Comments  First full day of exercise!  Patient was oriented to gym and equipment including functions, settings, policies, and procedures.  Patient's individual exercise prescription and  treatment plan were reviewed.  All starting workloads were established based on the results of the 6 minute walk test done at initial orientation visit.  The plan for exercise progression was also introduced and progression will be customized based on patient's performance and goals.          Exercise Goals and Review: Exercise Goals    Row Name 10/04/17 1244  Exercise Goals   Increase Physical Activity  Yes       Intervention  Provide advice, education, support and counseling about physical activity/exercise needs.;Develop an individualized exercise prescription for aerobic and resistive training based on initial evaluation findings, risk stratification, comorbidities and participant's personal goals.       Expected Outcomes  Short Term: Attend rehab on a regular basis to increase amount of physical activity.;Long Term: Add in home exercise to make exercise part of routine and to increase amount of physical activity.;Long Term: Exercising regularly at least 3-5 days a week.       Increase Strength and Stamina  Yes       Intervention  Provide advice, education, support and counseling about physical activity/exercise needs.;Develop an individualized exercise prescription for aerobic and resistive training based on initial evaluation findings, risk stratification, comorbidities and participant's personal goals.       Expected Outcomes  Short Term: Increase workloads from initial exercise prescription for resistance, speed, and METs.;Short Term: Perform resistance training exercises routinely during rehab and add in resistance training at home;Long Term: Improve cardiorespiratory fitness, muscular endurance and strength as measured by increased METs and functional capacity (6MWT)       Able to understand and use rate of perceived exertion (RPE) scale  Yes       Intervention  Provide education and explanation on how to use RPE scale       Expected Outcomes  Short Term: Able to use RPE  daily in rehab to express subjective intensity level;Long Term:  Able to use RPE to guide intensity level when exercising independently       Able to understand and use Dyspnea scale  Yes       Intervention  Provide education and explanation on how to use Dyspnea scale       Expected Outcomes  Short Term: Able to use Dyspnea scale daily in rehab to express subjective sense of shortness of breath during exertion;Long Term: Able to use Dyspnea scale to guide intensity level when exercising independently       Knowledge and understanding of Target Heart Rate Range (THRR)  Yes       Intervention  Provide education and explanation of THRR including how the numbers were predicted and where they are located for reference       Expected Outcomes  Short Term: Able to state/look up THRR;Short Term: Able to use daily as guideline for intensity in rehab;Long Term: Able to use THRR to govern intensity when exercising independently       Able to check pulse independently  Yes       Intervention  Provide education and demonstration on how to check pulse in carotid and radial arteries.;Review the importance of being able to check your own pulse for safety during independent exercise       Expected Outcomes  Short Term: Able to explain why pulse checking is important during independent exercise;Long Term: Able to check pulse independently and accurately       Understanding of Exercise Prescription  Yes       Intervention  Provide education, explanation, and written materials on patient's individual exercise prescription       Expected Outcomes  Short Term: Able to explain program exercise prescription;Long Term: Able to explain home exercise prescription to exercise independently          Exercise Goals Re-Evaluation : Exercise Goals Re-Evaluation    [place] Name 10/19/17 [phone]  Exercise Goal Re-Evaluation   Exercise Goals Review  Increase Physical Activity;Able to understand and use rate of perceived  exertion (RPE) scale;Knowledge and understanding of Target Heart Rate Range (THRR);Increase Strength and Stamina       Comments  Merry Proud is tolerating exercise well.  Staff will encourage  increased levels on TM and other equipment.       Expected Outcomes  Short - increase to 2.0/.5 on Tm and L 3 on XR Long - improve overall MET level          Discharge Exercise Prescription (Final Exercise Prescription Changes): Exercise Prescription Changes - 10/19/17 1500      Response to Exercise   Blood Pressure (Admit)  100/58    Blood Pressure (Exercise)  150/70    Blood Pressure (Exit)  100/60    Heart Rate (Admit)  97 bpm    Heart Rate (Exercise)  115 bpm    Heart Rate (Exit)  103 bpm    Rating of Perceived Exertion (Exercise)  14    Symptoms  none    Duration  Progress to 45 minutes of aerobic exercise without signs/symptoms of physical distress    Intensity  THRR unchanged      Progression   Progression  Continue to progress workloads to maintain intensity without signs/symptoms of physical distress.    Average METs  2.4      Resistance Training   Training Prescription  Yes    Weight  3 lb    Reps  10-15      Treadmill   MPH  1.8    Grade  0.5    Minutes  15    METs  2.5      REL-XR   Level  1    Speed  50    Minutes  15    METs  2.3       Nutrition:  Target Goals: Understanding of nutrition guidelines, daily intake of sodium <1573m, cholesterol <2027m calories 30% from fat and 7% or less from saturated fats, daily to have 5 or more servings of fruits and vegetables.  Biometrics: Pre Biometrics - 10/04/17 1245      Pre Biometrics   Height  5' 7.1" (1.704 m)    Weight  228 lb 4.8 oz (103.6 kg)    Waist Circumference  43 inches    Hip Circumference  47 inches    Waist to Hip Ratio  0.91 %    BMI (Calculated)  35.66    Single Leg Stand  1.16 seconds        Nutrition Therapy Plan and Nutrition Goals: Nutrition Therapy & Goals - 10/04/17 1341      Intervention  Plan   Intervention  Prescribe, educate and counsel regarding individualized specific dietary modifications aiming towards targeted core components such as weight, hypertension, lipid management, diabetes, heart failure and other comorbidities.;Nutrition handout(s) given to patient.    Expected Outcomes  Short Term Goal: Understand basic principles of dietary content, such as calories, fat, sodium, cholesterol and nutrients.;Short Term Goal: A plan has been developed with personal nutrition goals set during dietitian appointment.;Long Term Goal: Adherence to prescribed nutrition plan.       Nutrition Assessments: Nutrition Assessments - 10/04/17 1342      MEDFICTS Scores   Pre Score  93       Nutrition Goals Re-Evaluation:   Nutrition Goals Discharge (Final Nutrition Goals Re-Evaluation):   Psychosocial: Target Goals: Acknowledge presence or absence of significant depression  and/or stress, maximize coping skills, provide positive support system. Participant is able to verbalize types and ability to use techniques and skills needed for reducing stress and depression.   Initial Review & Psychosocial Screening: Initial Psych Review & Screening - 10/04/17 1346      Initial Review   Current issues with  Current Stress Concerns;Current Sleep Concerns   Hard time staying asleep. Some nights he takes 2 Tylenol pms and 0.5 xanax at night and he still cant stay asleep. He also is up for another sleep study because they said he is retaining too much CO2 with his current CPAP   Source of Stress Concerns  Chronic Illness      Family Dynamics   Good Support System?  Yes   wife     Barriers   Psychosocial barriers to participate in program  There are no identifiable barriers or psychosocial needs.;The patient should benefit from training in stress management and relaxation.      Screening Interventions   Interventions  Encouraged to exercise;Provide feedback about the scores to  participant;Program counselor consult;To provide support and resources with identified psychosocial needs    Expected Outcomes  Short Term goal: Utilizing psychosocial counselor, staff and physician to assist with identification of specific Stressors or current issues interfering with healing process. Setting desired goal for each stressor or current issue identified.;Long Term Goal: Stressors or current issues are controlled or eliminated.;Short Term goal: Identification and review with participant of any Quality of Life or Depression concerns found by scoring the questionnaire.;Long Term goal: The participant improves quality of Life and PHQ9 Scores as seen by post scores and/or verbalization of changes       Quality of Life Scores:  Quality of Life - 10/04/17 1049      Quality of Life   Select  Quality of Life      Quality of Life Scores   Health/Function Pre  7.47 %    Socioeconomic Pre  15.86 %    Psych/Spiritual Pre  6.25 %    Family Pre  8.4 %    GLOBAL Pre  9.17 %      Scores of 19 and below usually indicate a poorer quality of life in these areas.  A difference of  2-3 points is a clinically meaningful difference.  A difference of 2-3 points in the total score of the Quality of Life Index has been associated with significant improvement in overall quality of life, self-image, physical symptoms, and general health in studies assessing change in quality of life.  PHQ-9: Recent Review Flowsheet Data    Depression screen [facility] 2/9 10/04/2017   Decreased Interest 1   Down, Depressed, Hopeless 1   PHQ - 2 Score 2   Altered sleeping 2   Tired, decreased energy 3   Change in appetite 1   Feeling bad or failure about yourself  1   Trouble concentrating 0   Moving slowly or fidgety/restless 0   Suicidal thoughts 0   PHQ-9 Score 9   Difficult doing work/chores Somewhat difficult     Interpretation of Total Score  Total Score Depression Severity:  1-4 = Minimal depression, 5-9 =  Mild depression, 10-14 = Moderate depression, 15-19 = Moderately severe depression, 20-27 = Severe depression   Psychosocial Evaluation and Intervention: Psychosocial Evaluation - 10/13/17 1737      Psychosocial Evaluation & Interventions   Interventions  Encouraged to exercise with the program and follow exercise prescription  Psychosocial Re-Evaluation:   Psychosocial Discharge (Final Psychosocial Re-Evaluation):   Vocational Rehabilitation: Provide vocational rehab assistance to qualifying candidates.   Vocational Rehab Evaluation & Intervention: Vocational Rehab - 10/04/17 1350      Initial Vocational Rehab Evaluation & Intervention   Assessment shows need for Vocational Rehabilitation  No       Education: Education Goals: Education classes will be provided on a variety of topics geared toward better understanding of heart health and risk factor modification. Participant will state understanding/return demonstration of topics presented as noted by education test scores.  Learning Barriers/Preferences: Learning Barriers/Preferences - 10/04/17 1350      Learning Barriers/Preferences   Learning Barriers  None    Learning Preferences  Individual Instruction;Verbal Instruction       Education Topics:  AED/CPR: - Group verbal and written instruction with the use of models to demonstrate the basic use of the AED with the basic ABC's of resuscitation.   General Nutrition Guidelines/Fats and Fiber: -Group instruction provided by verbal, written material, models and posters to present the general guidelines for heart healthy nutrition. Gives an explanation and review of dietary fats and fiber.   Controlling Sodium/Reading Food Labels: -Group verbal and written material supporting the discussion of sodium use in heart healthy nutrition. Review and explanation with models, verbal and written materials for utilization of the food label.   Exercise Physiology & General  Exercise Guidelines: - Group verbal and written instruction with models to review the exercise physiology of the cardiovascular system and associated critical values. Provides general exercise guidelines with specific guidelines to those with heart or lung disease.    Cardiac Rehab from 10/18/2017 in [facility] Cardiac and Pulmonary Rehab  Date  10/13/17  Educator  AS  Instruction Review Code  1- Verbalizes Understanding      Aerobic Exercise & Resistance Training: - Gives group verbal and written instruction on the various components of exercise. Focuses on aerobic and resistive training programs and the benefits of this training and how to safely progress through these programs..   Cardiac Rehab from 10/18/2017 in [facility] Cardiac and Pulmonary Rehab  Date  10/18/17  Educator  [facility]  Instruction Review Code  1- Geologist, engineering, Balance, Mind/Body Relaxation: Provides group verbal/written instruction on the benefits of flexibility and balance training, including mind/body exercise modes such as yoga, pilates and tai chi.  Demonstration and skill practice provided.   Stress and Anxiety: - Provides group verbal and written instruction about the health risks of elevated stress and causes of high stress.  Discuss the correlation between heart/lung disease and anxiety and treatment options. Review healthy ways to manage with stress and anxiety.   Depression: - Provides group verbal and written instruction on the correlation between heart/lung disease and depressed mood, treatment options, and the stigmas associated with seeking treatment.   Anatomy & Physiology of the Heart: - Group verbal and written instruction and models provide basic cardiac anatomy and physiology, with the coronary electrical and arterial systems. Review of Valvular disease and Heart Failure   Cardiac Procedures: - Group verbal and written instruction to review commonly prescribed medications for heart  disease. Reviews the medication, class of the drug, and side effects. Includes the steps to properly store meds and maintain the prescription regimen. (beta blockers and nitrates)   Cardiac Medications I: - Group verbal and written instruction to review commonly prescribed medications for heart disease. Reviews the medication, class of the drug, and side effects.  Includes the steps to properly store meds and maintain the prescription regimen.   Cardiac Medications II: -Group verbal and written instruction to review commonly prescribed medications for heart disease. Reviews the medication, class of the drug, and side effects. (all other drug classes)    Go Sex-Intimacy & Heart Disease, Get SMART - Goal Setting: - Group verbal and written instruction through game format to discuss heart disease and the return to sexual intimacy. Provides group verbal and written material to discuss and apply goal setting through the application of the S.M.A.R.T. Method.   Other Matters of the Heart: - Provides group verbal, written materials and models to describe Stable Angina and Peripheral Artery. Includes description of the disease process and treatment options available to the cardiac patient.   Exercise & Equipment Safety: - Individual verbal instruction and demonstration of equipment use and safety with use of the equipment.   Cardiac Rehab from 10/18/2017 in [facility] Cardiac and Pulmonary Rehab  Date  10/06/17  Educator  Nada Maclachlan  Instruction Review Code  1- Verbalizes Understanding      Infection Prevention: - Provides verbal and written material to individual with discussion of infection control including proper hand washing and proper equipment cleaning during exercise session.   Cardiac Rehab from 10/18/2017 in [facility] Cardiac and Pulmonary Rehab  Date  10/04/17  Educator  [facility]  Instruction Review Code  1- Verbalizes Understanding      Falls Prevention: - Provides verbal and written material  to individual with discussion of falls prevention and safety.   Cardiac Rehab from 10/18/2017 in [facility] Cardiac and Pulmonary Rehab  Date  10/04/17  Educator  [facility]  Instruction Review Code  1- Verbalizes Understanding      Diabetes: - Individual verbal and written instruction to review signs/symptoms of diabetes, desired ranges of glucose level fasting, after meals and with exercise. Acknowledge that pre and post exercise glucose checks will be done for 3 sessions at entry of program.   Cardiac Rehab from 10/18/2017 in [facility] Cardiac and Pulmonary Rehab  Date  10/04/17  Educator  [facility]  Instruction Review Code  1- Verbalizes Understanding      Know Your Numbers and Risk Factors: -Group verbal and written instruction about important numbers in your health.  Discussion of what are risk factors and how they play a role in the disease process.  Review of Cholesterol, Blood Pressure, Diabetes, and BMI and the role they play in your overall health.   Sleep Hygiene: -Provides group verbal and written instruction about how sleep can affect your health.  Define sleep hygiene, discuss sleep cycles and impact of sleep habits. Review good sleep hygiene tips.    Cardiac Rehab from 10/18/2017 in [facility]  Date  10/06/17  Educator  Lucianne Lei, MSW  Instruction Review Code  1- Verbalizes Understanding      Other: -Provides group and verbal instruction on various topics (see comments)   Knowledge Questionnaire Score: Knowledge Questionnaire Score - 10/04/17 1049      Knowledge Questionnaire Score   Pre Score  25/26   Test reviewed with pt today.  Education Focus: Angina      Core Components/Risk Factors/Patient Goals at Admission: Personal Goals and Risk Factors at Admission - 10/04/17 1340      Core Components/Risk Factors/Patient Goals on Admission    Weight Management  Yes;Obesity;Weight Loss    Intervention  Weight Management: Develop a combined nutrition and exercise  program designed to reach desired caloric  intake, while maintaining appropriate intake of nutrient and fiber, sodium and fats, and appropriate energy expenditure required for the weight goal.;Weight Management: Provide education and appropriate resources to help participant work on and attain dietary goals.;Weight Management/Obesity: Establish reasonable short term and long term weight goals.;Obesity: Provide education and appropriate resources to help participant work on and attain dietary goals.    Admit Weight  228 lb (103.4 kg)    Goal Weight: Short Term  223 lb (101.2 kg)    Goal Weight: Long Term  200 lb (90.7 kg)    Expected Outcomes  Short Term: Continue to assess and modify interventions until short term weight is achieved;Long Term: Adherence to nutrition and physical activity/exercise program aimed toward attainment of established weight goal;Weight Loss: Understanding of general recommendations for a balanced deficit meal plan, which promotes 1-2 lb weight loss per week and includes a negative energy balance of [phone] kcal/d;Understanding recommendations for meals to include 15-35% energy as protein, 25-35% energy from fat, 35-60% energy from carbohydrates, less than '200mg'$  of dietary cholesterol, 20-35 gm of total fiber daily;Understanding of distribution of calorie intake throughout the day with the consumption of 4-5 meals/snacks    Tobacco Cessation  Yes   Quit 08/14/17   Intervention  Offer self-teaching materials, assist with locating and accessing local/national Quit Smoking programs, and support quit date choice.;Assist the participant in steps to quit. Provide individualized education and counseling about committing to Tobacco Cessation, relapse prevention, and pharmacological support that can be provided by physician.    Expected Outcomes  Long Term: Complete abstinence from all tobacco products for at least 12 months from quit date.;Short Term: Will quit all tobacco product use,  adhering to prevention of relapse plan.    Diabetes  Yes    Intervention  Provide education about signs/symptoms and action to take for hypo/hyperglycemia.;Provide education about proper nutrition, including hydration, and aerobic/resistive exercise prescription along with prescribed medications to achieve blood glucose in normal ranges: Fasting glucose 65-99 mg/dL    Expected Outcomes  Short Term: Participant verbalizes understanding of the signs/symptoms and immediate care of hyper/hypoglycemia, proper foot care and importance of medication, aerobic/resistive exercise and nutrition plan for blood glucose control.;Long Term: Attainment of HbA1C < 7%.    Hypertension  Yes    Intervention  Monitor prescription use compliance.;Provide education on lifestyle modifcations including regular physical activity/exercise, weight management, moderate sodium restriction and increased consumption of fresh fruit, vegetables, and low fat dairy, alcohol moderation, and smoking cessation.    Expected Outcomes  Short Term: Continued assessment and intervention until BP is < 140/32m HG in hypertensive participants. < 130/81mHG in hypertensive participants with diabetes, heart failure or chronic kidney disease.;Long Term: Maintenance of blood pressure at goal levels.    Lipids  Yes    Intervention  Provide education and support for participant on nutrition & aerobic/resistive exercise along with prescribed medications to achieve LDL '70mg'$ , HDL >'40mg'$ .    Expected Outcomes  Short Term: Participant states understanding of desired cholesterol values and is compliant with medications prescribed. Participant is following exercise prescription and nutrition guidelines.;Long Term: Cholesterol controlled with medications as prescribed, with individualized exercise RX and with personalized nutrition plan. Value goals: LDL < '70mg'$ , HDL > 40 mg.       Core Components/Risk Factors/Patient Goals Review:    Core Components/Risk  Factors/Patient Goals at Discharge (Final Review):    ITP Comments: ITP Comments    Row Name 10/04/17 1338 10/27/17 [id number]  ITP Comments  Med Review completed. Initial ITP created. Diagnosis can be found in [facility] 6/5  30 day review completed. ITP sent to Dr. Ramonita Lab, covering for Dr. Emily Filbert, Medical Director of Cardiac Rehab. Continue with ITP unless changes are made by physician.  New to program         Comments: 30 day review

## 2017-11-03 ENCOUNTER — Telehealth: Payer: Self-pay

## 2017-11-03 NOTE — Telephone Encounter (Signed)
Jose [name] had his hematoma checked and may need to have further tests.  His cardiologist wants a CAT scan.  He will let us know when he gets cleared to return to exercise.

## 2017-11-17 ENCOUNTER — Encounter: Payer: BLUE CROSS/BLUE SHIELD | Attending: Family Medicine

## 2017-11-17 DIAGNOSIS — Z87891 Personal history of nicotine dependence: Secondary | ICD-10-CM | POA: Insufficient documentation

## 2017-11-17 DIAGNOSIS — Z7901 Long term (current) use of anticoagulants: Secondary | ICD-10-CM | POA: Insufficient documentation

## 2017-11-17 DIAGNOSIS — Z79899 Other long term (current) drug therapy: Secondary | ICD-10-CM | POA: Insufficient documentation

## 2017-11-17 DIAGNOSIS — D6861 Antiphospholipid syndrome: Secondary | ICD-10-CM | POA: Insufficient documentation

## 2017-11-17 DIAGNOSIS — Z79891 Long term (current) use of opiate analgesic: Secondary | ICD-10-CM | POA: Insufficient documentation

## 2017-11-17 DIAGNOSIS — I2699 Other pulmonary embolism without acute cor pulmonale: Secondary | ICD-10-CM | POA: Insufficient documentation

## 2017-11-17 DIAGNOSIS — Z7951 Long term (current) use of inhaled steroids: Secondary | ICD-10-CM | POA: Insufficient documentation

## 2017-11-17 DIAGNOSIS — E119 Type 2 diabetes mellitus without complications: Secondary | ICD-10-CM | POA: Insufficient documentation

## 2017-11-17 DIAGNOSIS — I10 Essential (primary) hypertension: Secondary | ICD-10-CM | POA: Insufficient documentation

## 2017-11-17 DIAGNOSIS — Z7982 Long term (current) use of aspirin: Secondary | ICD-10-CM | POA: Insufficient documentation

## 2017-11-17 DIAGNOSIS — Z951 Presence of aortocoronary bypass graft: Secondary | ICD-10-CM | POA: Insufficient documentation

## 2017-11-17 DIAGNOSIS — J449 Chronic obstructive pulmonary disease, unspecified: Secondary | ICD-10-CM | POA: Insufficient documentation

## 2017-11-18 ENCOUNTER — Telehealth: Payer: Self-pay

## 2017-11-18 NOTE — Telephone Encounter (Signed)
Jose [name] had hematoma aspirated Tuesday and it is filling back up.  He will call his Dr to follow up with that and call to infrom [place] when he can return to exercise.

## 2017-11-24 ENCOUNTER — Encounter: Payer: Self-pay | Admitting: *Deleted

## 2017-11-24 DIAGNOSIS — Z951 Presence of aortocoronary bypass graft: Secondary | ICD-10-CM

## 2017-11-24 NOTE — Progress Notes (Signed)
Cardiac Individual Treatment Plan  Patient Details  Name: Jose [name] MRN: [id number] Date of Birth: June 27, 1955 Referring Provider:     Cardiac Rehab from 10/04/2017 in [facility] Cardiac and Pulmonary Rehab  Referring Provider  Carmin Richmond MD      Initial Encounter Date:    Cardiac Rehab from 10/04/2017 in [facility] Cardiac and Pulmonary Rehab  Date  10/04/17      Visit Diagnosis: S/P CABG x 3  Patient's Home Medications on Admission:  Current Outpatient Medications:  .  ALPRAZolam (XANAX) 0.5 MG tablet, TAKE ONE TABLET (0.5 MG TOTAL) BY MOUTH 3 (THREE) TIMES A DAY AS NEEDED FOR SLEEP OR ANXIETY., Disp: , Rfl:  .  aspirin EC 81 MG tablet, Take 81 mg by mouth daily., Disp: , Rfl:  .  Blood Glucose Monitoring Suppl (GLUCOCOM BLOOD GLUCOSE MONITOR) DEVI, 1 each by XX route as directed (For Blood Glucose Monitoring), Disp: , Rfl:  .  colchicine 0.6 MG tablet, Take 1 tablet (0.6 mg total) by mouth 2 (two) times daily. (Patient not taking: Reported on 10/04/2017), Disp: 60 tablet, Rfl: 2 .  furosemide (LASIX) 40 MG tablet, Take 1 tablet (40 mg total) by mouth daily. (Patient not taking: Reported on 10/04/2017), Disp: 30 tablet, Rfl: 2 .  glucose blood (PRECISION QID TEST) test strip, Use 1 each as directed (For Blood Glucose Monitoring 4 times daily) Use as instructed., Disp: , Rfl:  .  isosorbide mononitrate (IMDUR) 60 MG 24 hr tablet, Take 1 tablet (60 mg total) by mouth daily. (Patient not taking: Reported on 10/04/2017), Disp: 30 tablet, Rfl: 1 .  metoprolol succinate (TOPROL-XL) 100 MG 24 hr tablet, Take 100 mg by mouth daily., Disp: , Rfl: 3 .  metoprolol tartrate (LOPRESSOR) 12.5 mg TABS tablet, Take by mouth., Disp: , Rfl:  .  nitroGLYCERIN (NITROSTAT) 0.4 MG SL tablet, Place 0.4 mg under the tongue every 5 (five) minutes as needed for chest pain. , Disp: , Rfl: 1 .  pantoprazole (PROTONIX) 40 MG tablet, Take 40 mg by mouth 2 (two) times daily., Disp: , Rfl: 3 .  polyethylene glycol  (MIRALAX / GLYCOLAX) packet, Take 17 g by mouth daily. (Patient not taking: Reported on 10/04/2017), Disp: 14 each, Rfl: 0 .  potassium chloride (MICRO-K) 10 MEQ CR capsule, Take by mouth., Disp: , Rfl:  .  PROAIR HFA 108 (90 Base) MCG/ACT inhaler, Inhale 1-2 puffs into the lungs every 4 (four) hours as needed for wheezing. , Disp: , Rfl: 12 .  rosuvastatin (CRESTOR) 10 MG tablet, Take 10 mg by mouth every evening., Disp: , Rfl: 11 .  tamsulosin (FLOMAX) 0.4 MG CAPS capsule, Take 0.4 mg by mouth daily., Disp: , Rfl: 0 .  Tiotropium Bromide-Olodaterol (STIOLTO RESPIMAT) 2.5-2.5 MCG/ACT AERS, Inhale into the lungs., Disp: , Rfl:  .  topiramate (TOPAMAX) 50 MG tablet, Take 50 mg by mouth 2 (two) times daily., Disp: , Rfl: 0 .  torsemide (DEMADEX) 20 MG tablet, Take by mouth., Disp: , Rfl:  .  warfarin (COUMADIN) 5 MG tablet, Take 5-7.5 mg by mouth See admin instructions. Take 7.5 mg on Monday, Wednesday and Friday then take 5 mg all the other days, Disp: , Rfl: 0  Past Medical History: Past Medical History:  Diagnosis Date  . Antiphospholipid antibody syndrome ([place])   . CAD S/P percutaneous coronary angioplasty '98, '04, Feb 2017   [facility]  . Carotid arterial disease ([place]) 10/2015   moderate bilateral  . COPD (chronic obstructive pulmonary disease) (  [place])   . Coronary artery disease   . Hypertension   . Non-insulin treated type 2 diabetes mellitus ([place])   . Pulmonary emboli ([place]) 2011  . RBBB   . Sleep apnea     Tobacco Use: Social History   Tobacco Use  Smoking Status Former Smoker  . Packs/day: 0.50  . Types: Cigarettes  . Last attempt to quit: 08/14/2017  . Years since quitting: 0.2  Smokeless Tobacco Never Used    Labs: Recent Review Scientist, physiological    Labs for ITP Cardiac and Pulmonary Rehab Latest Ref Rng & Units 08/16/2016   Cholestrol 0 - 200 mg/dL 156   LDLCALC 0 - 99 mg/dL 64   HDL >40 mg/dL 28(L)   Trlycerides <150 mg/dL 322(H)   Hemoglobin A1c 4.8 - 5.6 % 5.9(H)        Exercise Target Goals: Exercise Program Goal: Individual exercise prescription set using results from initial 6 min walk test and THRR while considering  patient's activity barriers and safety.   Exercise Prescription Goal: Initial exercise prescription builds to 30-45 minutes a day of aerobic activity, 2-3 days per week.  Home exercise guidelines will be given to patient during program as part of exercise prescription that the participant will acknowledge.  Activity Barriers & Risk Stratification: Activity Barriers & Cardiac Risk Stratification - 10/04/17 1241      Activity Barriers & Cardiac Risk Stratification   Activity Barriers  Balance Concerns;Deconditioning;Muscular Weakness;Other (comment)    Comments  Hematoma above right knee    Cardiac Risk Stratification  High       6 Minute Walk: 6 Minute Walk    Row Name 10/04/17 1241         6 Minute Walk   Phase  Initial     Distance  996 feet     Walk Time  6 minutes     # of Rest Breaks  0     MPH  1.89     METS  2.57     RPE  13     VO2 Peak  8.99     Symptoms  No     Resting HR  87 bpm     Resting BP  132/66     Resting Oxygen Saturation   96 %     Exercise Oxygen Saturation  during 6 min walk  96 %     Max Ex. HR  118 bpm     Max Ex. BP  138/64     2 Minute Post BP  136/64        Oxygen Initial Assessment:   Oxygen Re-Evaluation:   Oxygen Discharge (Final Oxygen Re-Evaluation):   Initial Exercise Prescription: Initial Exercise Prescription - 10/04/17 1200      Date of Initial Exercise RX and Referring Provider   Date  10/04/17    Referring Provider  Carmin Richmond MD      Treadmill   MPH  1.8    Grade  0.5    Minutes  15    METs  2.5      NuStep   Level  1    SPM  80    Minutes  15    METs  2.5      REL-XR   Level  1    Speed  50    Minutes  15    METs  2.5      Prescription Details   Frequency (times per week)  3    Duration  Progress to 45 minutes of aerobic exercise  without signs/symptoms of physical distress      Intensity   THRR 40-80% of Max Heartrate  115-144    Ratings of Perceived Exertion  11-13    Perceived Dyspnea  0-4      Progression   Progression  Continue to progress workloads to maintain intensity without signs/symptoms of physical distress.      Resistance Training   Training Prescription  Yes    Weight  3 lbs    Reps  10-15       Perform Capillary Blood Glucose checks as needed.  Exercise Prescription Changes: Exercise Prescription Changes    Row Name 10/04/17 1200 10/19/17 1500           Response to Exercise   Blood Pressure (Admit)  132/66  100/58      Blood Pressure (Exercise)  138/64  150/70      Blood Pressure (Exit)  136/64  100/60      Heart Rate (Admit)  87 bpm  97 bpm      Heart Rate (Exercise)  118 bpm  115 bpm      Heart Rate (Exit)  90 bpm  103 bpm      Oxygen Saturation (Admit)  96 %  -      Oxygen Saturation (Exercise)  96 %  -      Rating of Perceived Exertion (Exercise)  13  14      Symptoms  none  none      Comments  walk test results  -      Duration  -  Progress to 45 minutes of aerobic exercise without signs/symptoms of physical distress      Intensity  -  THRR unchanged        Progression   Progression  -  Continue to progress workloads to maintain intensity without signs/symptoms of physical distress.      Average METs  -  2.4        Resistance Training   Training Prescription  -  Yes      Weight  -  3 lb      Reps  -  10-15        Treadmill   MPH  -  1.8      Grade  -  0.5      Minutes  -  15      METs  -  2.5        REL-XR   Level  -  1      Speed  -  50      Minutes  -  15      METs  -  2.3         Exercise Comments: Exercise Comments    Row Name 10/06/17 1636           Exercise Comments  First full day of exercise!  Patient was oriented to gym and equipment including functions, settings, policies, and procedures.  Patient's individual exercise prescription and  treatment plan were reviewed.  All starting workloads were established based on the results of the 6 minute walk test done at initial orientation visit.  The plan for exercise progression was also introduced and progression will be customized based on patient's performance and goals.          Exercise Goals and Review: Exercise Goals    Row Name 10/04/17 1244  Exercise Goals   Increase Physical Activity  Yes       Intervention  Provide advice, education, support and counseling about physical activity/exercise needs.;Develop an individualized exercise prescription for aerobic and resistive training based on initial evaluation findings, risk stratification, comorbidities and participant's personal goals.       Expected Outcomes  Short Term: Attend rehab on a regular basis to increase amount of physical activity.;Long Term: Add in home exercise to make exercise part of routine and to increase amount of physical activity.;Long Term: Exercising regularly at least 3-5 days a week.       Increase Strength and Stamina  Yes       Intervention  Provide advice, education, support and counseling about physical activity/exercise needs.;Develop an individualized exercise prescription for aerobic and resistive training based on initial evaluation findings, risk stratification, comorbidities and participant's personal goals.       Expected Outcomes  Short Term: Increase workloads from initial exercise prescription for resistance, speed, and METs.;Short Term: Perform resistance training exercises routinely during rehab and add in resistance training at home;Long Term: Improve cardiorespiratory fitness, muscular endurance and strength as measured by increased METs and functional capacity (6MWT)       Able to understand and use rate of perceived exertion (RPE) scale  Yes       Intervention  Provide education and explanation on how to use RPE scale       Expected Outcomes  Short Term: Able to use RPE  daily in rehab to express subjective intensity level;Long Term:  Able to use RPE to guide intensity level when exercising independently       Able to understand and use Dyspnea scale  Yes       Intervention  Provide education and explanation on how to use Dyspnea scale       Expected Outcomes  Short Term: Able to use Dyspnea scale daily in rehab to express subjective sense of shortness of breath during exertion;Long Term: Able to use Dyspnea scale to guide intensity level when exercising independently       Knowledge and understanding of Target Heart Rate Range (THRR)  Yes       Intervention  Provide education and explanation of THRR including how the numbers were predicted and where they are located for reference       Expected Outcomes  Short Term: Able to state/look up THRR;Short Term: Able to use daily as guideline for intensity in rehab;Long Term: Able to use THRR to govern intensity when exercising independently       Able to check pulse independently  Yes       Intervention  Provide education and demonstration on how to check pulse in carotid and radial arteries.;Review the importance of being able to check your own pulse for safety during independent exercise       Expected Outcomes  Short Term: Able to explain why pulse checking is important during independent exercise;Long Term: Able to check pulse independently and accurately       Understanding of Exercise Prescription  Yes       Intervention  Provide education, explanation, and written materials on patient's individual exercise prescription       Expected Outcomes  Short Term: Able to explain program exercise prescription;Long Term: Able to explain home exercise prescription to exercise independently          Exercise Goals Re-Evaluation : Exercise Goals Re-Evaluation    [place] Name 10/19/17 [phone]  Exercise Goal Re-Evaluation   Exercise Goals Review  Increase Physical Activity;Able to understand and use rate of perceived  exertion (RPE) scale;Knowledge and understanding of Target Heart Rate Range (THRR);Increase Strength and Stamina       Comments  Merry Proud is tolerating exercise well.  Staff will encourage  increased levels on TM and other equipment.       Expected Outcomes  Short - increase to 2.0/.5 on Tm and L 3 on XR Long - improve overall MET level          Discharge Exercise Prescription (Final Exercise Prescription Changes): Exercise Prescription Changes - 10/19/17 1500      Response to Exercise   Blood Pressure (Admit)  100/58    Blood Pressure (Exercise)  150/70    Blood Pressure (Exit)  100/60    Heart Rate (Admit)  97 bpm    Heart Rate (Exercise)  115 bpm    Heart Rate (Exit)  103 bpm    Rating of Perceived Exertion (Exercise)  14    Symptoms  none    Duration  Progress to 45 minutes of aerobic exercise without signs/symptoms of physical distress    Intensity  THRR unchanged      Progression   Progression  Continue to progress workloads to maintain intensity without signs/symptoms of physical distress.    Average METs  2.4      Resistance Training   Training Prescription  Yes    Weight  3 lb    Reps  10-15      Treadmill   MPH  1.8    Grade  0.5    Minutes  15    METs  2.5      REL-XR   Level  1    Speed  50    Minutes  15    METs  2.3       Nutrition:  Target Goals: Understanding of nutrition guidelines, daily intake of sodium <1573m, cholesterol <2027m calories 30% from fat and 7% or less from saturated fats, daily to have 5 or more servings of fruits and vegetables.  Biometrics: Pre Biometrics - 10/04/17 1245      Pre Biometrics   Height  5' 7.1" (1.704 m)    Weight  228 lb 4.8 oz (103.6 kg)    Waist Circumference  43 inches    Hip Circumference  47 inches    Waist to Hip Ratio  0.91 %    BMI (Calculated)  35.66    Single Leg Stand  1.16 seconds        Nutrition Therapy Plan and Nutrition Goals: Nutrition Therapy & Goals - 10/04/17 1341      Intervention  Plan   Intervention  Prescribe, educate and counsel regarding individualized specific dietary modifications aiming towards targeted core components such as weight, hypertension, lipid management, diabetes, heart failure and other comorbidities.;Nutrition handout(s) given to patient.    Expected Outcomes  Short Term Goal: Understand basic principles of dietary content, such as calories, fat, sodium, cholesterol and nutrients.;Short Term Goal: A plan has been developed with personal nutrition goals set during dietitian appointment.;Long Term Goal: Adherence to prescribed nutrition plan.       Nutrition Assessments: Nutrition Assessments - 10/04/17 1342      MEDFICTS Scores   Pre Score  93       Nutrition Goals Re-Evaluation:   Nutrition Goals Discharge (Final Nutrition Goals Re-Evaluation):   Psychosocial: Target Goals: Acknowledge presence or absence of significant depression  and/or stress, maximize coping skills, provide positive support system. Participant is able to verbalize types and ability to use techniques and skills needed for reducing stress and depression.   Initial Review & Psychosocial Screening: Initial Psych Review & Screening - 10/04/17 1346      Initial Review   Current issues with  Current Stress Concerns;Current Sleep Concerns   Hard time staying asleep. Some nights he takes 2 Tylenol pms and 0.5 xanax at night and he still cant stay asleep. He also is up for another sleep study because they said he is retaining too much CO2 with his current CPAP   Source of Stress Concerns  Chronic Illness      Family Dynamics   Good Support System?  Yes   wife     Barriers   Psychosocial barriers to participate in program  There are no identifiable barriers or psychosocial needs.;The patient should benefit from training in stress management and relaxation.      Screening Interventions   Interventions  Encouraged to exercise;Provide feedback about the scores to  participant;Program counselor consult;To provide support and resources with identified psychosocial needs    Expected Outcomes  Short Term goal: Utilizing psychosocial counselor, staff and physician to assist with identification of specific Stressors or current issues interfering with healing process. Setting desired goal for each stressor or current issue identified.;Long Term Goal: Stressors or current issues are controlled or eliminated.;Short Term goal: Identification and review with participant of any Quality of Life or Depression concerns found by scoring the questionnaire.;Long Term goal: The participant improves quality of Life and PHQ9 Scores as seen by post scores and/or verbalization of changes       Quality of Life Scores:  Quality of Life - 10/04/17 1049      Quality of Life   Select  Quality of Life      Quality of Life Scores   Health/Function Pre  7.47 %    Socioeconomic Pre  15.86 %    Psych/Spiritual Pre  6.25 %    Family Pre  8.4 %    GLOBAL Pre  9.17 %      Scores of 19 and below usually indicate a poorer quality of life in these areas.  A difference of  2-3 points is a clinically meaningful difference.  A difference of 2-3 points in the total score of the Quality of Life Index has been associated with significant improvement in overall quality of life, self-image, physical symptoms, and general health in studies assessing change in quality of life.  PHQ-9: Recent Review Flowsheet Data    Depression screen [facility] 2/9 10/04/2017   Decreased Interest 1   Down, Depressed, Hopeless 1   PHQ - 2 Score 2   Altered sleeping 2   Tired, decreased energy 3   Change in appetite 1   Feeling bad or failure about yourself  1   Trouble concentrating 0   Moving slowly or fidgety/restless 0   Suicidal thoughts 0   PHQ-9 Score 9   Difficult doing work/chores Somewhat difficult     Interpretation of Total Score  Total Score Depression Severity:  1-4 = Minimal depression, 5-9 =  Mild depression, 10-14 = Moderate depression, 15-19 = Moderately severe depression, 20-27 = Severe depression   Psychosocial Evaluation and Intervention: Psychosocial Evaluation - 10/13/17 1737      Psychosocial Evaluation & Interventions   Interventions  Encouraged to exercise with the program and follow exercise prescription  Psychosocial Re-Evaluation:   Psychosocial Discharge (Final Psychosocial Re-Evaluation):   Vocational Rehabilitation: Provide vocational rehab assistance to qualifying candidates.   Vocational Rehab Evaluation & Intervention: Vocational Rehab - 10/04/17 1350      Initial Vocational Rehab Evaluation & Intervention   Assessment shows need for Vocational Rehabilitation  No       Education: Education Goals: Education classes will be provided on a variety of topics geared toward better understanding of heart health and risk factor modification. Participant will state understanding/return demonstration of topics presented as noted by education test scores.  Learning Barriers/Preferences: Learning Barriers/Preferences - 10/04/17 1350      Learning Barriers/Preferences   Learning Barriers  None    Learning Preferences  Individual Instruction;Verbal Instruction       Education Topics:  AED/CPR: - Group verbal and written instruction with the use of models to demonstrate the basic use of the AED with the basic ABC's of resuscitation.   General Nutrition Guidelines/Fats and Fiber: -Group instruction provided by verbal, written material, models and posters to present the general guidelines for heart healthy nutrition. Gives an explanation and review of dietary fats and fiber.   Controlling Sodium/Reading Food Labels: -Group verbal and written material supporting the discussion of sodium use in heart healthy nutrition. Review and explanation with models, verbal and written materials for utilization of the food label.   Exercise Physiology & General  Exercise Guidelines: - Group verbal and written instruction with models to review the exercise physiology of the cardiovascular system and associated critical values. Provides general exercise guidelines with specific guidelines to those with heart or lung disease.    Cardiac Rehab from 10/18/2017 in [facility] Cardiac and Pulmonary Rehab  Date  10/13/17  Educator  AS  Instruction Review Code  1- Verbalizes Understanding      Aerobic Exercise & Resistance Training: - Gives group verbal and written instruction on the various components of exercise. Focuses on aerobic and resistive training programs and the benefits of this training and how to safely progress through these programs..   Cardiac Rehab from 10/18/2017 in [facility] Cardiac and Pulmonary Rehab  Date  10/18/17  Educator  [facility]  Instruction Review Code  1- Geologist, engineering, Balance, Mind/Body Relaxation: Provides group verbal/written instruction on the benefits of flexibility and balance training, including mind/body exercise modes such as yoga, pilates and tai chi.  Demonstration and skill practice provided.   Stress and Anxiety: - Provides group verbal and written instruction about the health risks of elevated stress and causes of high stress.  Discuss the correlation between heart/lung disease and anxiety and treatment options. Review healthy ways to manage with stress and anxiety.   Depression: - Provides group verbal and written instruction on the correlation between heart/lung disease and depressed mood, treatment options, and the stigmas associated with seeking treatment.   Anatomy & Physiology of the Heart: - Group verbal and written instruction and models provide basic cardiac anatomy and physiology, with the coronary electrical and arterial systems. Review of Valvular disease and Heart Failure   Cardiac Procedures: - Group verbal and written instruction to review commonly prescribed medications for heart  disease. Reviews the medication, class of the drug, and side effects. Includes the steps to properly store meds and maintain the prescription regimen. (beta blockers and nitrates)   Cardiac Medications I: - Group verbal and written instruction to review commonly prescribed medications for heart disease. Reviews the medication, class of the drug, and side effects.  Includes the steps to properly store meds and maintain the prescription regimen.   Cardiac Medications II: -Group verbal and written instruction to review commonly prescribed medications for heart disease. Reviews the medication, class of the drug, and side effects. (all other drug classes)    Go Sex-Intimacy & Heart Disease, Get SMART - Goal Setting: - Group verbal and written instruction through game format to discuss heart disease and the return to sexual intimacy. Provides group verbal and written material to discuss and apply goal setting through the application of the S.M.A.R.T. Method.   Other Matters of the Heart: - Provides group verbal, written materials and models to describe Stable Angina and Peripheral Artery. Includes description of the disease process and treatment options available to the cardiac patient.   Exercise & Equipment Safety: - Individual verbal instruction and demonstration of equipment use and safety with use of the equipment.   Cardiac Rehab from 10/18/2017 in [facility] Cardiac and Pulmonary Rehab  Date  10/06/17  Educator  Nada Maclachlan  Instruction Review Code  1- Verbalizes Understanding      Infection Prevention: - Provides verbal and written material to individual with discussion of infection control including proper hand washing and proper equipment cleaning during exercise session.   Cardiac Rehab from 10/18/2017 in [facility] Cardiac and Pulmonary Rehab  Date  10/04/17  Educator  [facility]  Instruction Review Code  1- Verbalizes Understanding      Falls Prevention: - Provides verbal and written material  to individual with discussion of falls prevention and safety.   Cardiac Rehab from 10/18/2017 in [facility] Cardiac and Pulmonary Rehab  Date  10/04/17  Educator  [facility]  Instruction Review Code  1- Verbalizes Understanding      Diabetes: - Individual verbal and written instruction to review signs/symptoms of diabetes, desired ranges of glucose level fasting, after meals and with exercise. Acknowledge that pre and post exercise glucose checks will be done for 3 sessions at entry of program.   Cardiac Rehab from 10/18/2017 in [facility] Cardiac and Pulmonary Rehab  Date  10/04/17  Educator  [facility]  Instruction Review Code  1- Verbalizes Understanding      Know Your Numbers and Risk Factors: -Group verbal and written instruction about important numbers in your health.  Discussion of what are risk factors and how they play a role in the disease process.  Review of Cholesterol, Blood Pressure, Diabetes, and BMI and the role they play in your overall health.   Sleep Hygiene: -Provides group verbal and written instruction about how sleep can affect your health.  Define sleep hygiene, discuss sleep cycles and impact of sleep habits. Review good sleep hygiene tips.    Cardiac Rehab from 10/18/2017 in [facility]  Date  10/06/17  Educator  Lucianne Lei, MSW  Instruction Review Code  1- Verbalizes Understanding      Other: -Provides group and verbal instruction on various topics (see comments)   Knowledge Questionnaire Score: Knowledge Questionnaire Score - 10/04/17 1049      Knowledge Questionnaire Score   Pre Score  25/26   Test reviewed with pt today.  Education Focus: Angina      Core Components/Risk Factors/Patient Goals at Admission: Personal Goals and Risk Factors at Admission - 10/04/17 1340      Core Components/Risk Factors/Patient Goals on Admission    Weight Management  Yes;Obesity;Weight Loss    Intervention  Weight Management: Develop a combined nutrition and exercise  program designed to reach desired caloric  intake, while maintaining appropriate intake of nutrient and fiber, sodium and fats, and appropriate energy expenditure required for the weight goal.;Weight Management: Provide education and appropriate resources to help participant work on and attain dietary goals.;Weight Management/Obesity: Establish reasonable short term and long term weight goals.;Obesity: Provide education and appropriate resources to help participant work on and attain dietary goals.    Admit Weight  228 lb (103.4 kg)    Goal Weight: Short Term  223 lb (101.2 kg)    Goal Weight: Long Term  200 lb (90.7 kg)    Expected Outcomes  Short Term: Continue to assess and modify interventions until short term weight is achieved;Long Term: Adherence to nutrition and physical activity/exercise program aimed toward attainment of established weight goal;Weight Loss: Understanding of general recommendations for a balanced deficit meal plan, which promotes 1-2 lb weight loss per week and includes a negative energy balance of [phone] kcal/d;Understanding recommendations for meals to include 15-35% energy as protein, 25-35% energy from fat, 35-60% energy from carbohydrates, less than 243m of dietary cholesterol, 20-35 gm of total fiber daily;Understanding of distribution of calorie intake throughout the day with the consumption of 4-5 meals/snacks    Tobacco Cessation  Yes   Quit 08/14/17   Intervention  Offer self-teaching materials, assist with locating and accessing local/national Quit Smoking programs, and support quit date choice.;Assist the participant in steps to quit. Provide individualized education and counseling about committing to Tobacco Cessation, relapse prevention, and pharmacological support that can be provided by physician.    Expected Outcomes  Long Term: Complete abstinence from all tobacco products for at least 12 months from quit date.;Short Term: Will quit all tobacco product use,  adhering to prevention of relapse plan.    Diabetes  Yes    Intervention  Provide education about signs/symptoms and action to take for hypo/hyperglycemia.;Provide education about proper nutrition, including hydration, and aerobic/resistive exercise prescription along with prescribed medications to achieve blood glucose in normal ranges: Fasting glucose 65-99 mg/dL    Expected Outcomes  Short Term: Participant verbalizes understanding of the signs/symptoms and immediate care of hyper/hypoglycemia, proper foot care and importance of medication, aerobic/resistive exercise and nutrition plan for blood glucose control.;Long Term: Attainment of HbA1C < 7%.    Hypertension  Yes    Intervention  Monitor prescription use compliance.;Provide education on lifestyle modifcations including regular physical activity/exercise, weight management, moderate sodium restriction and increased consumption of fresh fruit, vegetables, and low fat dairy, alcohol moderation, and smoking cessation.    Expected Outcomes  Short Term: Continued assessment and intervention until BP is < 140/927mHG in hypertensive participants. < 130/8014mG in hypertensive participants with diabetes, heart failure or chronic kidney disease.;Long Term: Maintenance of blood pressure at goal levels.    Lipids  Yes    Intervention  Provide education and support for participant on nutrition & aerobic/resistive exercise along with prescribed medications to achieve LDL <4m55mDL >40mg67m Expected Outcomes  Short Term: Participant states understanding of desired cholesterol values and is compliant with medications prescribed. Participant is following exercise prescription and nutrition guidelines.;Long Term: Cholesterol controlled with medications as prescribed, with individualized exercise RX and with personalized nutrition plan. Value goals: LDL < 4mg,19m > 40 mg.       Core Components/Risk Factors/Patient Goals Review:    Core Components/Risk  Factors/Patient Goals at Discharge (Final Review):    ITP Comments: ITP Comments    Row Name 10/04/17 1338 10/27/17 0917 [id number]  ITP Comments  Med Review completed. Initial ITP created. Diagnosis can be found in [facility] 6/5  30 day review completed. ITP sent to Dr. Ramonita Lab, covering for Dr. Emily Filbert, Medical Director of Cardiac Rehab. Continue with ITP unless changes are made by physician.  New to program  30 day review completed. ITP sent to Dr. Emily Filbert, Medical Director of Cardiac Rehab. Continue with ITP unless changes are made by physician  Out for medical reason        Comments:

## 2017-12-03 ENCOUNTER — Telehealth: Payer: Self-pay

## 2017-12-03 NOTE — Telephone Encounter (Signed)
LMOM

## 2017-12-06 NOTE — Progress Notes (Signed)
Discharge Progress Report  Patient Details  Name: Jose [name] MRN: [id number] Date of Birth: 12/31/1955 Referring Provider:     Cardiac Rehab from 10/04/2017 in [facility]  Referring Provider  Gerrianne Scale MD       Number of Visits: 12  Reason for Discharge:  Early Exit:  Lack of attendance  Smoking History:  Social History   Tobacco Use  Smoking Status Former Smoker  . Packs/day: 0.50  . Types: Cigarettes  . Last attempt to quit: 08/14/2017  . Years since quitting: 0.3  Smokeless Tobacco Never Used    Diagnosis:  No diagnosis found.  ADL UCSD:   Initial Exercise Prescription: Initial Exercise Prescription - 10/04/17 1200      Date of Initial Exercise RX and Referring Provider   Date  10/04/17    Referring Provider  Gerrianne Scale MD      Treadmill   MPH  1.8    Grade  0.5    Minutes  15    METs  2.5      NuStep   Level  1    SPM  80    Minutes  15    METs  2.5      REL-XR   Level  1    Speed  50    Minutes  15    METs  2.5      Prescription Details   Frequency (times per week)  3    Duration  Progress to 45 minutes of aerobic exercise without signs/symptoms of physical distress      Intensity   THRR 40-80% of Max Heartrate  115-144    Ratings of Perceived Exertion  11-13    Perceived Dyspnea  0-4      Progression   Progression  Continue to progress workloads to maintain intensity without signs/symptoms of physical distress.      Resistance Training   Training Prescription  Yes    Weight  3 lbs    Reps  10-15       Discharge Exercise Prescription (Final Exercise Prescription Changes): Exercise Prescription Changes - 10/19/17 1500      Response to Exercise   Blood Pressure (Admit)  100/58    Blood Pressure (Exercise)  150/70    Blood Pressure (Exit)  100/60    Heart Rate (Admit)  97 bpm    Heart Rate (Exercise)  115 bpm    Heart Rate (Exit)  103 bpm    Rating of Perceived Exertion (Exercise)  14    Symptoms  none    Duration  Progress to 45 minutes of aerobic exercise without signs/symptoms of physical distress    Intensity  THRR unchanged      Progression   Progression  Continue to progress workloads to maintain intensity without signs/symptoms of physical distress.    Average METs  2.4      Resistance Training   Training Prescription  Yes    Weight  3 lb    Reps  10-15      Treadmill   MPH  1.8    Grade  0.5    Minutes  15    METs  2.5      REL-XR   Level  1    Speed  50    Minutes  15    METs  2.3       Functional Capacity: 6 Minute Walk    Row Name 10/04/17 1241  6 Minute Walk   Phase  Initial     Distance  996 feet     Walk Time  6 minutes     # of Rest Breaks  0     MPH  1.89     METS  2.57     RPE  13     VO2 Peak  8.99     Symptoms  No     Resting HR  87 bpm     Resting BP  132/66     Resting Oxygen Saturation   96 %     Exercise Oxygen Saturation  during 6 min walk  96 %     Max Ex. HR  118 bpm     Max Ex. BP  138/64     2 Minute Post BP  136/64        Psychological, QOL, Others - Outcomes: PHQ 2/9: Depression screen PHQ 2/9 10/04/2017  Decreased Interest 1  Down, Depressed, Hopeless 1  PHQ - 2 Score 2  Altered sleeping 2  Tired, decreased energy 3  Change in appetite 1  Feeling bad or failure about yourself  1  Trouble concentrating 0  Moving slowly or fidgety/restless 0  Suicidal thoughts 0  PHQ-9 Score 9  Difficult doing work/chores Somewhat difficult    Quality of Life: Quality of Life - 10/04/17 1049      Quality of Life   Select  Quality of Life      Quality of Life Scores   Health/Function Pre  7.47 %    Socioeconomic Pre  15.86 %    Psych/Spiritual Pre  6.25 %    Family Pre  8.4 %    GLOBAL Pre  9.17 %       Personal Goals: Goals established at orientation with interventions provided to work toward goal. Personal Goals and Risk Factors at Admission - 10/04/17 1340      Core Components/Risk  Factors/Patient Goals on Admission    Weight Management  Yes;Obesity;Weight Loss    Intervention  Weight Management: Develop a combined nutrition and exercise program designed to reach desired caloric intake, while maintaining appropriate intake of nutrient and fiber, sodium and fats, and appropriate energy expenditure required for the weight goal.;Weight Management: Provide education and appropriate resources to help participant work on and attain dietary goals.;Weight Management/Obesity: Establish reasonable short term and long term weight goals.;Obesity: Provide education and appropriate resources to help participant work on and attain dietary goals.    Admit Weight  228 lb (103.4 kg)    Goal Weight: Short Term  223 lb (101.2 kg)    Goal Weight: Long Term  200 lb (90.7 kg)    Expected Outcomes  Short Term: Continue to assess and modify interventions until short term weight is achieved;Long Term: Adherence to nutrition and physical activity/exercise program aimed toward attainment of established weight goal;Weight Loss: Understanding of general recommendations for a balanced deficit meal plan, which promotes 1-2 lb weight loss per week and includes a negative energy balance of [phone] kcal/d;Understanding recommendations for meals to include 15-35% energy as protein, 25-35% energy from fat, 35-60% energy from carbohydrates, less than 200mg  of dietary cholesterol, 20-35 gm of total fiber daily;Understanding of distribution of calorie intake throughout the day with the consumption of 4-5 meals/snacks    Tobacco Cessation  Yes   Quit 08/14/17   Intervention  Offer self-teaching materials, assist with locating and accessing local/national Quit Smoking programs, and support quit date choice.;Assist the  participant in steps to quit. Provide individualized education and counseling about committing to Tobacco Cessation, relapse prevention, and pharmacological support that can be provided by physician.    Expected  Outcomes  Long Term: Complete abstinence from all tobacco products for at least 12 months from quit date.;Short Term: Will quit all tobacco product use, adhering to prevention of relapse plan.    Diabetes  Yes    Intervention  Provide education about signs/symptoms and action to take for hypo/hyperglycemia.;Provide education about proper nutrition, including hydration, and aerobic/resistive exercise prescription along with prescribed medications to achieve blood glucose in normal ranges: Fasting glucose 65-99 mg/dL    Expected Outcomes  Short Term: Participant verbalizes understanding of the signs/symptoms and immediate care of hyper/hypoglycemia, proper foot care and importance of medication, aerobic/resistive exercise and nutrition plan for blood glucose control.;Long Term: Attainment of HbA1C < 7%.    Hypertension  Yes    Intervention  Monitor prescription use compliance.;Provide education on lifestyle modifcations including regular physical activity/exercise, weight management, moderate sodium restriction and increased consumption of fresh fruit, vegetables, and low fat dairy, alcohol moderation, and smoking cessation.    Expected Outcomes  Short Term: Continued assessment and intervention until BP is < 140/47mm HG in hypertensive participants. < 130/25mm HG in hypertensive participants with diabetes, heart failure or chronic kidney disease.;Long Term: Maintenance of blood pressure at goal levels.    Lipids  Yes    Intervention  Provide education and support for participant on nutrition & aerobic/resistive exercise along with prescribed medications to achieve LDL 70mg , HDL >40mg .    Expected Outcomes  Short Term: Participant states understanding of desired cholesterol values and is compliant with medications prescribed. Participant is following exercise prescription and nutrition guidelines.;Long Term: Cholesterol controlled with medications as prescribed, with individualized exercise RX and with  personalized nutrition plan. Value goals: LDL < 70mg , HDL > 40 mg.        Personal Goals Discharge:   Exercise Goals and Review: Exercise Goals    Row Name 10/04/17 1244             Exercise Goals   Increase Physical Activity  Yes       Intervention  Provide advice, education, support and counseling about physical activity/exercise needs.;Develop an individualized exercise prescription for aerobic and resistive training based on initial evaluation findings, risk stratification, comorbidities and participant's personal goals.       Expected Outcomes  Short Term: Attend rehab on a regular basis to increase amount of physical activity.;Long Term: Add in home exercise to make exercise part of routine and to increase amount of physical activity.;Long Term: Exercising regularly at least 3-5 days a week.       Increase Strength and Stamina  Yes       Intervention  Provide advice, education, support and counseling about physical activity/exercise needs.;Develop an individualized exercise prescription for aerobic and resistive training based on initial evaluation findings, risk stratification, comorbidities and participant's personal goals.       Expected Outcomes  Short Term: Increase workloads from initial exercise prescription for resistance, speed, and METs.;Short Term: Perform resistance training exercises routinely during rehab and add in resistance training at home;Long Term: Improve cardiorespiratory fitness, muscular endurance and strength as measured by increased METs and functional capacity ( )       Able to understand and use rate of perceived exertion (RPE) scale  Yes       Intervention  Provide education and explanation on how to use RPE scale  Expected Outcomes  Short Term: Able to use RPE daily in rehab to express subjective intensity level;Long Term:  Able to use RPE to guide intensity level when exercising independently       Able to understand and use Dyspnea scale  Yes        Intervention  Provide education and explanation on how to use Dyspnea scale       Expected Outcomes  Short Term: Able to use Dyspnea scale daily in rehab to express subjective sense of shortness of breath during exertion;Long Term: Able to use Dyspnea scale to guide intensity level when exercising independently       Knowledge and understanding of Target Heart Rate Range (THRR)  Yes       Intervention  Provide education and explanation of THRR including how the numbers were predicted and where they are located for reference       Expected Outcomes  Short Term: Able to state/look up THRR;Short Term: Able to use daily as guideline for intensity in rehab;Long Term: Able to use THRR to govern intensity when exercising independently       Able to check pulse independently  Yes       Intervention  Provide education and demonstration on how to check pulse in carotid and radial arteries.;Review the importance of being able to check your own pulse for safety during independent exercise       Expected Outcomes  Short Term: Able to explain why pulse checking is important during independent exercise;Long Term: Able to check pulse independently and accurately       Understanding of Exercise Prescription  Yes       Intervention  Provide education, explanation, and written materials on patient's individual exercise prescription       Expected Outcomes  Short Term: Able to explain program exercise prescription;Long Term: Able to explain home exercise prescription to exercise independently          Nutrition & Weight - Outcomes: Pre Biometrics - 10/04/17 1245      Pre Biometrics   Height  5' 7.1" (1.704 m)    Weight  228 lb 4.8 oz (103.6 kg)    Waist Circumference  43 inches    Hip Circumference  47 inches    Waist to Hip Ratio  0.91 %    BMI (Calculated)  35.66    Single Leg Stand  1.16 seconds        Nutrition: Nutrition Therapy & Goals - 10/04/17 1341      Intervention Plan   Intervention   Prescribe, educate and counsel regarding individualized specific dietary modifications aiming towards targeted core components such as weight, hypertension, lipid management, diabetes, heart failure and other comorbidities.;Nutrition handout(s) given to patient.    Expected Outcomes  Short Term Goal: Understand basic principles of dietary content, such as calories, fat, sodium, cholesterol and nutrients.;Short Term Goal: A plan has been developed with personal nutrition goals set during dietitian appointment.;Long Term Goal: Adherence to prescribed nutrition plan.       Nutrition Discharge: Nutrition Assessments - 10/04/17 1342      MEDFICTS Scores   Pre Score  93       Education Questionnaire Score: Knowledge Questionnaire Score - 10/04/17 1049      Knowledge Questionnaire Score   Pre Score  25/26   Test reviewed with pt today.  Education Focus: Angina      Goals reviewed with patient; copy given to patient.

## 2017-12-06 NOTE — Progress Notes (Signed)
Cardiac Individual Treatment Plan  Patient Details  Name: Jose [name] MRN: [id number] Date of Birth: 1955-11-28 Referring Provider:     Cardiac Rehab from 10/04/2017 in [facility] Cardiac and Pulmonary Rehab  Referring Provider  Carmin Richmond MD      Initial Encounter Date:    Cardiac Rehab from 10/04/2017 in [facility] Cardiac and Pulmonary Rehab  Date  10/04/17      Visit Diagnosis: No diagnosis found.  Patient's Home Medications on Admission:  Current Outpatient Medications:  .  ALPRAZolam (XANAX) 0.5 MG tablet, TAKE ONE TABLET (0.5 MG TOTAL) BY MOUTH 3 (THREE) TIMES A DAY AS NEEDED FOR SLEEP OR ANXIETY., Disp: , Rfl:  .  aspirin EC 81 MG tablet, Take 81 mg by mouth daily., Disp: , Rfl:  .  Blood Glucose Monitoring Suppl (GLUCOCOM BLOOD GLUCOSE MONITOR) DEVI, 1 each by XX route as directed (For Blood Glucose Monitoring), Disp: , Rfl:  .  colchicine 0.6 MG tablet, Take 1 tablet (0.6 mg total) by mouth 2 (two) times daily. (Patient not taking: Reported on 10/04/2017), Disp: 60 tablet, Rfl: 2 .  furosemide (LASIX) 40 MG tablet, Take 1 tablet (40 mg total) by mouth daily. (Patient not taking: Reported on 10/04/2017), Disp: 30 tablet, Rfl: 2 .  glucose blood (PRECISION QID TEST) test strip, Use 1 each as directed (For Blood Glucose Monitoring 4 times daily) Use as instructed., Disp: , Rfl:  .  isosorbide mononitrate (IMDUR) 60 MG 24 hr tablet, Take 1 tablet (60 mg total) by mouth daily. (Patient not taking: Reported on 10/04/2017), Disp: 30 tablet, Rfl: 1 .  metoprolol succinate (TOPROL-XL) 100 MG 24 hr tablet, Take 100 mg by mouth daily., Disp: , Rfl: 3 .  metoprolol tartrate (LOPRESSOR) 12.5 mg TABS tablet, Take by mouth., Disp: , Rfl:  .  nitroGLYCERIN (NITROSTAT) 0.4 MG SL tablet, Place 0.4 mg under the tongue every 5 (five) minutes as needed for chest pain. , Disp: , Rfl: 1 .  pantoprazole (PROTONIX) 40 MG tablet, Take 40 mg by mouth 2 (two) times daily., Disp: , Rfl: 3 .  polyethylene  glycol (MIRALAX / GLYCOLAX) packet, Take 17 g by mouth daily. (Patient not taking: Reported on 10/04/2017), Disp: 14 each, Rfl: 0 .  potassium chloride (MICRO-K) 10 MEQ CR capsule, Take by mouth., Disp: , Rfl:  .  PROAIR HFA 108 (90 Base) MCG/ACT inhaler, Inhale 1-2 puffs into the lungs every 4 (four) hours as needed for wheezing. , Disp: , Rfl: 12 .  rosuvastatin (CRESTOR) 10 MG tablet, Take 10 mg by mouth every evening., Disp: , Rfl: 11 .  tamsulosin (FLOMAX) 0.4 MG CAPS capsule, Take 0.4 mg by mouth daily., Disp: , Rfl: 0 .  Tiotropium Bromide-Olodaterol (STIOLTO RESPIMAT) 2.5-2.5 MCG/ACT AERS, Inhale into the lungs., Disp: , Rfl:  .  topiramate (TOPAMAX) 50 MG tablet, Take 50 mg by mouth 2 (two) times daily., Disp: , Rfl: 0 .  torsemide (DEMADEX) 20 MG tablet, Take by mouth., Disp: , Rfl:  .  warfarin (COUMADIN) 5 MG tablet, Take 5-7.5 mg by mouth See admin instructions. Take 7.5 mg on Monday, Wednesday and Friday then take 5 mg all the other days, Disp: , Rfl: 0  Past Medical History: Past Medical History:  Diagnosis Date  . Antiphospholipid antibody syndrome ([place])   . CAD S/P percutaneous coronary angioplasty '98, '04, Feb 2017   [facility]  . Carotid arterial disease ([place]) 10/2015   moderate bilateral  . COPD (chronic obstructive pulmonary disease) ([place])   .  Coronary artery disease   . Hypertension   . Non-insulin treated type 2 diabetes mellitus ([place])   . Pulmonary emboli ([place]) 2011  . RBBB   . Sleep apnea     Tobacco Use: Social History   Tobacco Use  Smoking Status Former Smoker  . Packs/day: 0.50  . Types: Cigarettes  . Last attempt to quit: 08/14/2017  . Years since quitting: 0.3  Smokeless Tobacco Never Used    Labs: Recent Review Scientist, physiological    Labs for ITP Cardiac and Pulmonary Rehab Latest Ref Rng & Units 08/16/2016   Cholestrol 0 - 200 mg/dL 156   LDLCALC 0 - 99 mg/dL 64   HDL >40 mg/dL 28(L)   Trlycerides <150 mg/dL 322(H)   Hemoglobin A1c 4.8 - 5.6 % 5.9(H)        Exercise Target Goals: Exercise Program Goal: Individual exercise prescription set using results from initial 6 min walk test and THRR while considering  patient's activity barriers and safety.   Exercise Prescription Goal: Initial exercise prescription builds to 30-45 minutes a day of aerobic activity, 2-3 days per week.  Home exercise guidelines will be given to patient during program as part of exercise prescription that the participant will acknowledge.  Activity Barriers & Risk Stratification: Activity Barriers & Cardiac Risk Stratification - 10/04/17 1241      Activity Barriers & Cardiac Risk Stratification   Activity Barriers  Balance Concerns;Deconditioning;Muscular Weakness;Other (comment)    Comments  Hematoma above right knee    Cardiac Risk Stratification  High       6 Minute Walk: 6 Minute Walk    Row Name 10/04/17 1241         6 Minute Walk   Phase  Initial     Distance  996 feet     Walk Time  6 minutes     # of Rest Breaks  0     MPH  1.89     METS  2.57     RPE  13     VO2 Peak  8.99     Symptoms  No     Resting HR  87 bpm     Resting BP  132/66     Resting Oxygen Saturation   96 %     Exercise Oxygen Saturation  during 6 min walk  96 %     Max Ex. HR  118 bpm     Max Ex. BP  138/64     2 Minute Post BP  136/64        Oxygen Initial Assessment:   Oxygen Re-Evaluation:   Oxygen Discharge (Final Oxygen Re-Evaluation):   Initial Exercise Prescription: Initial Exercise Prescription - 10/04/17 1200      Date of Initial Exercise RX and Referring Provider   Date  10/04/17    Referring Provider  Carmin Richmond MD      Treadmill   MPH  1.8    Grade  0.5    Minutes  15    METs  2.5      NuStep   Level  1    SPM  80    Minutes  15    METs  2.5      REL-XR   Level  1    Speed  50    Minutes  15    METs  2.5      Prescription Details   Frequency (times per week)  3  Duration  Progress to 45 minutes of aerobic exercise  without signs/symptoms of physical distress      Intensity   THRR 40-80% of Max Heartrate  115-144    Ratings of Perceived Exertion  11-13    Perceived Dyspnea  0-4      Progression   Progression  Continue to progress workloads to maintain intensity without signs/symptoms of physical distress.      Resistance Training   Training Prescription  Yes    Weight  3 lbs    Reps  10-15       Perform Capillary Blood Glucose checks as needed.  Exercise Prescription Changes: Exercise Prescription Changes    Row Name 10/04/17 1200 10/19/17 1500           Response to Exercise   Blood Pressure (Admit)  132/66  100/58      Blood Pressure (Exercise)  138/64  150/70      Blood Pressure (Exit)  136/64  100/60      Heart Rate (Admit)  87 bpm  97 bpm      Heart Rate (Exercise)  118 bpm  115 bpm      Heart Rate (Exit)  90 bpm  103 bpm      Oxygen Saturation (Admit)  96 %  -      Oxygen Saturation (Exercise)  96 %  -      Rating of Perceived Exertion (Exercise)  13  14      Symptoms  none  none      Comments  walk test results  -      Duration  -  Progress to 45 minutes of aerobic exercise without signs/symptoms of physical distress      Intensity  -  THRR unchanged        Progression   Progression  -  Continue to progress workloads to maintain intensity without signs/symptoms of physical distress.      Average METs  -  2.4        Resistance Training   Training Prescription  -  Yes      Weight  -  3 lb      Reps  -  10-15        Treadmill   MPH  -  1.8      Grade  -  0.5      Minutes  -  15      METs  -  2.5        REL-XR   Level  -  1      Speed  -  50      Minutes  -  15      METs  -  2.3         Exercise Comments: Exercise Comments    Row Name 10/06/17 1636           Exercise Comments  First full day of exercise!  Patient was oriented to gym and equipment including functions, settings, policies, and procedures.  Patient's individual exercise prescription and  treatment plan were reviewed.  All starting workloads were established based on the results of the 6 minute walk test done at initial orientation visit.  The plan for exercise progression was also introduced and progression will be customized based on patient's performance and goals.          Exercise Goals and Review: Exercise Goals    Row Name 10/04/17 1244  Exercise Goals   Increase Physical Activity  Yes       Intervention  Provide advice, education, support and counseling about physical activity/exercise needs.;Develop an individualized exercise prescription for aerobic and resistive training based on initial evaluation findings, risk stratification, comorbidities and participant's personal goals.       Expected Outcomes  Short Term: Attend rehab on a regular basis to increase amount of physical activity.;Long Term: Add in home exercise to make exercise part of routine and to increase amount of physical activity.;Long Term: Exercising regularly at least 3-5 days a week.       Increase Strength and Stamina  Yes       Intervention  Provide advice, education, support and counseling about physical activity/exercise needs.;Develop an individualized exercise prescription for aerobic and resistive training based on initial evaluation findings, risk stratification, comorbidities and participant's personal goals.       Expected Outcomes  Short Term: Increase workloads from initial exercise prescription for resistance, speed, and METs.;Short Term: Perform resistance training exercises routinely during rehab and add in resistance training at home;Long Term: Improve cardiorespiratory fitness, muscular endurance and strength as measured by increased METs and functional capacity (6MWT)       Able to understand and use rate of perceived exertion (RPE) scale  Yes       Intervention  Provide education and explanation on how to use RPE scale       Expected Outcomes  Short Term: Able to use RPE  daily in rehab to express subjective intensity level;Long Term:  Able to use RPE to guide intensity level when exercising independently       Able to understand and use Dyspnea scale  Yes       Intervention  Provide education and explanation on how to use Dyspnea scale       Expected Outcomes  Short Term: Able to use Dyspnea scale daily in rehab to express subjective sense of shortness of breath during exertion;Long Term: Able to use Dyspnea scale to guide intensity level when exercising independently       Knowledge and understanding of Target Heart Rate Range (THRR)  Yes       Intervention  Provide education and explanation of THRR including how the numbers were predicted and where they are located for reference       Expected Outcomes  Short Term: Able to state/look up THRR;Short Term: Able to use daily as guideline for intensity in rehab;Long Term: Able to use THRR to govern intensity when exercising independently       Able to check pulse independently  Yes       Intervention  Provide education and demonstration on how to check pulse in carotid and radial arteries.;Review the importance of being able to check your own pulse for safety during independent exercise       Expected Outcomes  Short Term: Able to explain why pulse checking is important during independent exercise;Long Term: Able to check pulse independently and accurately       Understanding of Exercise Prescription  Yes       Intervention  Provide education, explanation, and written materials on patient's individual exercise prescription       Expected Outcomes  Short Term: Able to explain program exercise prescription;Long Term: Able to explain home exercise prescription to exercise independently          Exercise Goals Re-Evaluation : Exercise Goals Re-Evaluation    [place] Name 10/19/17 [phone]  Exercise Goal Re-Evaluation   Exercise Goals Review  Increase Physical Activity;Able to understand and use rate of perceived  exertion (RPE) scale;Knowledge and understanding of Target Heart Rate Range (THRR);Increase Strength and Stamina       Comments  Merry Proud is tolerating exercise well.  Staff will encourage  increased levels on TM and other equipment.       Expected Outcomes  Short - increase to 2.0/.5 on Tm and L 3 on XR Long - improve overall MET level          Discharge Exercise Prescription (Final Exercise Prescription Changes): Exercise Prescription Changes - 10/19/17 1500      Response to Exercise   Blood Pressure (Admit)  100/58    Blood Pressure (Exercise)  150/70    Blood Pressure (Exit)  100/60    Heart Rate (Admit)  97 bpm    Heart Rate (Exercise)  115 bpm    Heart Rate (Exit)  103 bpm    Rating of Perceived Exertion (Exercise)  14    Symptoms  none    Duration  Progress to 45 minutes of aerobic exercise without signs/symptoms of physical distress    Intensity  THRR unchanged      Progression   Progression  Continue to progress workloads to maintain intensity without signs/symptoms of physical distress.    Average METs  2.4      Resistance Training   Training Prescription  Yes    Weight  3 lb    Reps  10-15      Treadmill   MPH  1.8    Grade  0.5    Minutes  15    METs  2.5      REL-XR   Level  1    Speed  50    Minutes  15    METs  2.3       Nutrition:  Target Goals: Understanding of nutrition guidelines, daily intake of sodium <1573m, cholesterol <2027m calories 30% from fat and 7% or less from saturated fats, daily to have 5 or more servings of fruits and vegetables.  Biometrics: Pre Biometrics - 10/04/17 1245      Pre Biometrics   Height  5' 7.1" (1.704 m)    Weight  228 lb 4.8 oz (103.6 kg)    Waist Circumference  43 inches    Hip Circumference  47 inches    Waist to Hip Ratio  0.91 %    BMI (Calculated)  35.66    Single Leg Stand  1.16 seconds        Nutrition Therapy Plan and Nutrition Goals: Nutrition Therapy & Goals - 10/04/17 1341      Intervention  Plan   Intervention  Prescribe, educate and counsel regarding individualized specific dietary modifications aiming towards targeted core components such as weight, hypertension, lipid management, diabetes, heart failure and other comorbidities.;Nutrition handout(s) given to patient.    Expected Outcomes  Short Term Goal: Understand basic principles of dietary content, such as calories, fat, sodium, cholesterol and nutrients.;Short Term Goal: A plan has been developed with personal nutrition goals set during dietitian appointment.;Long Term Goal: Adherence to prescribed nutrition plan.       Nutrition Assessments: Nutrition Assessments - 10/04/17 1342      MEDFICTS Scores   Pre Score  93       Nutrition Goals Re-Evaluation:   Nutrition Goals Discharge (Final Nutrition Goals Re-Evaluation):   Psychosocial: Target Goals: Acknowledge presence or absence of significant depression  and/or stress, maximize coping skills, provide positive support system. Participant is able to verbalize types and ability to use techniques and skills needed for reducing stress and depression.   Initial Review & Psychosocial Screening: Initial Psych Review & Screening - 10/04/17 1346      Initial Review   Current issues with  Current Stress Concerns;Current Sleep Concerns   Hard time staying asleep. Some nights he takes 2 Tylenol pms and 0.5 xanax at night and he still cant stay asleep. He also is up for another sleep study because they said he is retaining too much CO2 with his current CPAP   Source of Stress Concerns  Chronic Illness      Family Dynamics   Good Support System?  Yes   wife     Barriers   Psychosocial barriers to participate in program  There are no identifiable barriers or psychosocial needs.;The patient should benefit from training in stress management and relaxation.      Screening Interventions   Interventions  Encouraged to exercise;Provide feedback about the scores to  participant;Program counselor consult;To provide support and resources with identified psychosocial needs    Expected Outcomes  Short Term goal: Utilizing psychosocial counselor, staff and physician to assist with identification of specific Stressors or current issues interfering with healing process. Setting desired goal for each stressor or current issue identified.;Long Term Goal: Stressors or current issues are controlled or eliminated.;Short Term goal: Identification and review with participant of any Quality of Life or Depression concerns found by scoring the questionnaire.;Long Term goal: The participant improves quality of Life and PHQ9 Scores as seen by post scores and/or verbalization of changes       Quality of Life Scores:  Quality of Life - 10/04/17 1049      Quality of Life   Select  Quality of Life      Quality of Life Scores   Health/Function Pre  7.47 %    Socioeconomic Pre  15.86 %    Psych/Spiritual Pre  6.25 %    Family Pre  8.4 %    GLOBAL Pre  9.17 %      Scores of 19 and below usually indicate a poorer quality of life in these areas.  A difference of  2-3 points is a clinically meaningful difference.  A difference of 2-3 points in the total score of the Quality of Life Index has been associated with significant improvement in overall quality of life, self-image, physical symptoms, and general health in studies assessing change in quality of life.  PHQ-9: Recent Review Flowsheet Data    Depression screen [facility] 2/9 10/04/2017   Decreased Interest 1   Down, Depressed, Hopeless 1   PHQ - 2 Score 2   Altered sleeping 2   Tired, decreased energy 3   Change in appetite 1   Feeling bad or failure about yourself  1   Trouble concentrating 0   Moving slowly or fidgety/restless 0   Suicidal thoughts 0   PHQ-9 Score 9   Difficult doing work/chores Somewhat difficult     Interpretation of Total Score  Total Score Depression Severity:  1-4 = Minimal depression, 5-9 =  Mild depression, 10-14 = Moderate depression, 15-19 = Moderately severe depression, 20-27 = Severe depression   Psychosocial Evaluation and Intervention: Psychosocial Evaluation - 10/13/17 1737      Psychosocial Evaluation & Interventions   Interventions  Encouraged to exercise with the program and follow exercise prescription  Psychosocial Re-Evaluation:   Psychosocial Discharge (Final Psychosocial Re-Evaluation):   Vocational Rehabilitation: Provide vocational rehab assistance to qualifying candidates.   Vocational Rehab Evaluation & Intervention: Vocational Rehab - 10/04/17 1350      Initial Vocational Rehab Evaluation & Intervention   Assessment shows need for Vocational Rehabilitation  No       Education: Education Goals: Education classes will be provided on a variety of topics geared toward better understanding of heart health and risk factor modification. Participant will state understanding/return demonstration of topics presented as noted by education test scores.  Learning Barriers/Preferences: Learning Barriers/Preferences - 10/04/17 1350      Learning Barriers/Preferences   Learning Barriers  None    Learning Preferences  Individual Instruction;Verbal Instruction       Education Topics:  AED/CPR: - Group verbal and written instruction with the use of models to demonstrate the basic use of the AED with the basic ABC's of resuscitation.   General Nutrition Guidelines/Fats and Fiber: -Group instruction provided by verbal, written material, models and posters to present the general guidelines for heart healthy nutrition. Gives an explanation and review of dietary fats and fiber.   Controlling Sodium/Reading Food Labels: -Group verbal and written material supporting the discussion of sodium use in heart healthy nutrition. Review and explanation with models, verbal and written materials for utilization of the food label.   Exercise Physiology & General  Exercise Guidelines: - Group verbal and written instruction with models to review the exercise physiology of the cardiovascular system and associated critical values. Provides general exercise guidelines with specific guidelines to those with heart or lung disease.    Cardiac Rehab from 10/18/2017 in [facility] Cardiac and Pulmonary Rehab  Date  10/13/17  Educator  AS  Instruction Review Code  1- Verbalizes Understanding      Aerobic Exercise & Resistance Training: - Gives group verbal and written instruction on the various components of exercise. Focuses on aerobic and resistive training programs and the benefits of this training and how to safely progress through these programs..   Cardiac Rehab from 10/18/2017 in [facility] Cardiac and Pulmonary Rehab  Date  10/18/17  Educator  [facility]  Instruction Review Code  1- Geologist, engineering, Balance, Mind/Body Relaxation: Provides group verbal/written instruction on the benefits of flexibility and balance training, including mind/body exercise modes such as yoga, pilates and tai chi.  Demonstration and skill practice provided.   Stress and Anxiety: - Provides group verbal and written instruction about the health risks of elevated stress and causes of high stress.  Discuss the correlation between heart/lung disease and anxiety and treatment options. Review healthy ways to manage with stress and anxiety.   Depression: - Provides group verbal and written instruction on the correlation between heart/lung disease and depressed mood, treatment options, and the stigmas associated with seeking treatment.   Anatomy & Physiology of the Heart: - Group verbal and written instruction and models provide basic cardiac anatomy and physiology, with the coronary electrical and arterial systems. Review of Valvular disease and Heart Failure   Cardiac Procedures: - Group verbal and written instruction to review commonly prescribed medications for heart  disease. Reviews the medication, class of the drug, and side effects. Includes the steps to properly store meds and maintain the prescription regimen. (beta blockers and nitrates)   Cardiac Medications I: - Group verbal and written instruction to review commonly prescribed medications for heart disease. Reviews the medication, class of the drug, and side effects.  Includes the steps to properly store meds and maintain the prescription regimen.   Cardiac Medications II: -Group verbal and written instruction to review commonly prescribed medications for heart disease. Reviews the medication, class of the drug, and side effects. (all other drug classes)    Go Sex-Intimacy & Heart Disease, Get SMART - Goal Setting: - Group verbal and written instruction through game format to discuss heart disease and the return to sexual intimacy. Provides group verbal and written material to discuss and apply goal setting through the application of the S.M.A.R.T. Method.   Other Matters of the Heart: - Provides group verbal, written materials and models to describe Stable Angina and Peripheral Artery. Includes description of the disease process and treatment options available to the cardiac patient.   Exercise & Equipment Safety: - Individual verbal instruction and demonstration of equipment use and safety with use of the equipment.   Cardiac Rehab from 10/18/2017 in [facility] Cardiac and Pulmonary Rehab  Date  10/06/17  Educator  Nada Maclachlan  Instruction Review Code  1- Verbalizes Understanding      Infection Prevention: - Provides verbal and written material to individual with discussion of infection control including proper hand washing and proper equipment cleaning during exercise session.   Cardiac Rehab from 10/18/2017 in [facility] Cardiac and Pulmonary Rehab  Date  10/04/17  Educator  [facility]  Instruction Review Code  1- Verbalizes Understanding      Falls Prevention: - Provides verbal and written material  to individual with discussion of falls prevention and safety.   Cardiac Rehab from 10/18/2017 in [facility] Cardiac and Pulmonary Rehab  Date  10/04/17  Educator  [facility]  Instruction Review Code  1- Verbalizes Understanding      Diabetes: - Individual verbal and written instruction to review signs/symptoms of diabetes, desired ranges of glucose level fasting, after meals and with exercise. Acknowledge that pre and post exercise glucose checks will be done for 3 sessions at entry of program.   Cardiac Rehab from 10/18/2017 in [facility] Cardiac and Pulmonary Rehab  Date  10/04/17  Educator  [facility]  Instruction Review Code  1- Verbalizes Understanding      Know Your Numbers and Risk Factors: -Group verbal and written instruction about important numbers in your health.  Discussion of what are risk factors and how they play a role in the disease process.  Review of Cholesterol, Blood Pressure, Diabetes, and BMI and the role they play in your overall health.   Sleep Hygiene: -Provides group verbal and written instruction about how sleep can affect your health.  Define sleep hygiene, discuss sleep cycles and impact of sleep habits. Review good sleep hygiene tips.    Cardiac Rehab from 10/18/2017 in [facility]  Date  10/06/17  Educator  Lucianne Lei, MSW  Instruction Review Code  1- Verbalizes Understanding      Other: -Provides group and verbal instruction on various topics (see comments)   Knowledge Questionnaire Score: Knowledge Questionnaire Score - 10/04/17 1049      Knowledge Questionnaire Score   Pre Score  25/26   Test reviewed with pt today.  Education Focus: Angina      Core Components/Risk Factors/Patient Goals at Admission: Personal Goals and Risk Factors at Admission - 10/04/17 1340      Core Components/Risk Factors/Patient Goals on Admission    Weight Management  Yes;Obesity;Weight Loss    Intervention  Weight Management: Develop a combined nutrition and exercise  program designed to reach desired caloric  intake, while maintaining appropriate intake of nutrient and fiber, sodium and fats, and appropriate energy expenditure required for the weight goal.;Weight Management: Provide education and appropriate resources to help participant work on and attain dietary goals.;Weight Management/Obesity: Establish reasonable short term and long term weight goals.;Obesity: Provide education and appropriate resources to help participant work on and attain dietary goals.    Admit Weight  228 lb (103.4 kg)    Goal Weight: Short Term  223 lb (101.2 kg)    Goal Weight: Long Term  200 lb (90.7 kg)    Expected Outcomes  Short Term: Continue to assess and modify interventions until short term weight is achieved;Long Term: Adherence to nutrition and physical activity/exercise program aimed toward attainment of established weight goal;Weight Loss: Understanding of general recommendations for a balanced deficit meal plan, which promotes 1-2 lb weight loss per week and includes a negative energy balance of [id number] kcal/d;Understanding recommendations for meals to include 15-35% energy as protein, 25-35% energy from fat, 35-60% energy from carbohydrates, less than 266m of dietary cholesterol, 20-35 gm of total fiber daily;Understanding of distribution of calorie intake throughout the day with the consumption of 4-5 meals/snacks    Tobacco Cessation  Yes   Quit 08/14/17   Intervention  Offer self-teaching materials, assist with locating and accessing local/national Quit Smoking programs, and support quit date choice.;Assist the participant in steps to quit. Provide individualized education and counseling about committing to Tobacco Cessation, relapse prevention, and pharmacological support that can be provided by physician.    Expected Outcomes  Long Term: Complete abstinence from all tobacco products for at least 12 months from quit date.;Short Term: Will quit all tobacco product use,  adhering to prevention of relapse plan.    Diabetes  Yes    Intervention  Provide education about signs/symptoms and action to take for hypo/hyperglycemia.;Provide education about proper nutrition, including hydration, and aerobic/resistive exercise prescription along with prescribed medications to achieve blood glucose in normal ranges: Fasting glucose 65-99 mg/dL    Expected Outcomes  Short Term: Participant verbalizes understanding of the signs/symptoms and immediate care of hyper/hypoglycemia, proper foot care and importance of medication, aerobic/resistive exercise and nutrition plan for blood glucose control.;Long Term: Attainment of HbA1C < 7%.    Hypertension  Yes    Intervention  Monitor prescription use compliance.;Provide education on lifestyle modifcations including regular physical activity/exercise, weight management, moderate sodium restriction and increased consumption of fresh fruit, vegetables, and low fat dairy, alcohol moderation, and smoking cessation.    Expected Outcomes  Short Term: Continued assessment and intervention until BP is < 140/975mHG in hypertensive participants. < 130/8062mG in hypertensive participants with diabetes, heart failure or chronic kidney disease.;Long Term: Maintenance of blood pressure at goal levels.    Lipids  Yes    Intervention  Provide education and support for participant on nutrition & aerobic/resistive exercise along with prescribed medications to achieve LDL <58m26mDL >40mg81m Expected Outcomes  Short Term: Participant states understanding of desired cholesterol values and is compliant with medications prescribed. Participant is following exercise prescription and nutrition guidelines.;Long Term: Cholesterol controlled with medications as prescribed, with individualized exercise RX and with personalized nutrition plan. Value goals: LDL < 58mg,104m > 40 mg.       Core Components/Risk Factors/Patient Goals Review:    Core Components/Risk  Factors/Patient Goals at Discharge (Final Review):    ITP Comments: ITP Comments    Row Name 10/04/17 1338 10/27/17 0917 [id number]  ITP Comments  Med Review completed. Initial ITP created. Diagnosis can be found in [facility] 6/5  30 day review completed. ITP sent to Dr. Ramonita Lab, covering for Dr. Emily Filbert, Medical Director of Cardiac Rehab. Continue with ITP unless changes are made by physician.  New to program  30 day review completed. ITP sent to Dr. Emily Filbert, Medical Director of Cardiac Rehab. Continue with ITP unless changes are made by physician  Out for medical reason        Comments: Discharge ITP

## 2018-02-03 ENCOUNTER — Encounter: Payer: BLUE CROSS/BLUE SHIELD | Attending: Family Medicine | Admitting: *Deleted

## 2018-02-03 ENCOUNTER — Encounter: Payer: Self-pay | Admitting: *Deleted

## 2018-02-03 VITALS — Ht 66.9 in | Wt 239.9 lb

## 2018-02-03 DIAGNOSIS — J449 Chronic obstructive pulmonary disease, unspecified: Secondary | ICD-10-CM | POA: Insufficient documentation

## 2018-02-03 DIAGNOSIS — I10 Essential (primary) hypertension: Secondary | ICD-10-CM | POA: Diagnosis not present

## 2018-02-03 DIAGNOSIS — Z79891 Long term (current) use of opiate analgesic: Secondary | ICD-10-CM | POA: Insufficient documentation

## 2018-02-03 DIAGNOSIS — E119 Type 2 diabetes mellitus without complications: Secondary | ICD-10-CM | POA: Diagnosis not present

## 2018-02-03 DIAGNOSIS — Z7951 Long term (current) use of inhaled steroids: Secondary | ICD-10-CM | POA: Diagnosis not present

## 2018-02-03 DIAGNOSIS — Z951 Presence of aortocoronary bypass graft: Secondary | ICD-10-CM | POA: Diagnosis present

## 2018-02-03 DIAGNOSIS — Z79899 Other long term (current) drug therapy: Secondary | ICD-10-CM | POA: Diagnosis not present

## 2018-02-03 DIAGNOSIS — D6861 Antiphospholipid syndrome: Secondary | ICD-10-CM | POA: Diagnosis not present

## 2018-02-03 DIAGNOSIS — Z87891 Personal history of nicotine dependence: Secondary | ICD-10-CM | POA: Diagnosis not present

## 2018-02-03 DIAGNOSIS — Z7982 Long term (current) use of aspirin: Secondary | ICD-10-CM | POA: Diagnosis not present

## 2018-02-03 DIAGNOSIS — Z7901 Long term (current) use of anticoagulants: Secondary | ICD-10-CM | POA: Diagnosis not present

## 2018-02-03 DIAGNOSIS — I2699 Other pulmonary embolism without acute cor pulmonale: Secondary | ICD-10-CM | POA: Insufficient documentation

## 2018-02-03 NOTE — Progress Notes (Signed)
Cardiac Individual Treatment Plan  Patient Details  Name: Jose [name] MRN: [id number] Date of Birth: 10/29/1955 Referring Provider:     Cardiac Rehab from 02/03/2018 in [facility]  Referring Provider  Carmin Richmond MD      Initial Encounter Date:    Cardiac Rehab from 02/03/2018 in [facility] Cardiac and Pulmonary Rehab  Date  02/03/18      Visit Diagnosis: S/P CABG x 3  Patient's Home Medications on Admission:  Current Outpatient Medications:  .  ALPRAZolam (XANAX) 0.5 MG tablet, TAKE ONE TABLET (0.5 MG TOTAL) BY MOUTH 3 (THREE) TIMES A DAY AS NEEDED FOR SLEEP OR ANXIETY., Disp: , Rfl:  .  aspirin EC 81 MG tablet, Take 81 mg by mouth daily., Disp: , Rfl:  .  Blood Glucose Monitoring Suppl (GLUCOCOM BLOOD GLUCOSE MONITOR) DEVI, 1 each by XX route as directed (For Blood Glucose Monitoring), Disp: , Rfl:  .  colchicine 0.6 MG tablet, Take 1 tablet (0.6 mg total) by mouth 2 (two) times daily. (Patient not taking: Reported on 10/04/2017), Disp: 60 tablet, Rfl: 2 .  furosemide (LASIX) 40 MG tablet, Take 1 tablet (40 mg total) by mouth daily. (Patient not taking: Reported on 10/04/2017), Disp: 30 tablet, Rfl: 2 .  glucose blood (PRECISION QID TEST) test strip, Use 1 each as directed (For Blood Glucose Monitoring 4 times daily) Use as instructed., Disp: , Rfl:  .  isosorbide mononitrate (IMDUR) 60 MG 24 hr tablet, Take 1 tablet (60 mg total) by mouth daily. (Patient not taking: Reported on 10/04/2017), Disp: 30 tablet, Rfl: 1 .  metoprolol succinate (TOPROL-XL) 100 MG 24 hr tablet, Take 100 mg by mouth daily., Disp: , Rfl: 3 .  metoprolol tartrate (LOPRESSOR) 12.5 mg TABS tablet, Take by mouth., Disp: , Rfl:  .  nitroGLYCERIN (NITROSTAT) 0.4 MG SL tablet, Place 0.4 mg under the tongue every 5 (five) minutes as needed for chest pain. , Disp: , Rfl: 1 .  pantoprazole (PROTONIX) 40 MG tablet, Take 40 mg by mouth 2 (two) times daily., Disp: , Rfl: 3 .  polyethylene glycol  (MIRALAX / GLYCOLAX) packet, Take 17 g by mouth daily. (Patient not taking: Reported on 10/04/2017), Disp: 14 each, Rfl: 0 .  potassium chloride (MICRO-K) 10 MEQ CR capsule, Take by mouth., Disp: , Rfl:  .  PROAIR HFA 108 (90 Base) MCG/ACT inhaler, Inhale 1-2 puffs into the lungs every 4 (four) hours as needed for wheezing. , Disp: , Rfl: 12 .  rosuvastatin (CRESTOR) 10 MG tablet, Take 10 mg by mouth every evening., Disp: , Rfl: 11 .  tamsulosin (FLOMAX) 0.4 MG CAPS capsule, Take 0.4 mg by mouth daily., Disp: , Rfl: 0 .  Tiotropium Bromide-Olodaterol (STIOLTO RESPIMAT) 2.5-2.5 MCG/ACT AERS, Inhale into the lungs., Disp: , Rfl:  .  topiramate (TOPAMAX) 50 MG tablet, Take 50 mg by mouth 2 (two) times daily., Disp: , Rfl: 0 .  torsemide (DEMADEX) 20 MG tablet, Take by mouth., Disp: , Rfl:  .  warfarin (COUMADIN) 5 MG tablet, Take 5-7.5 mg by mouth See admin instructions. Take 7.5 mg on Monday, Wednesday and Friday then take 5 mg all the other days, Disp: , Rfl: 0  Past Medical History: Past Medical History:  Diagnosis Date  . Antiphospholipid antibody syndrome ([place])   . CAD S/P percutaneous coronary angioplasty '98, '04, Feb 2017   [facility]  . Carotid arterial disease ([place]) 10/2015   moderate bilateral  . COPD (chronic obstructive pulmonary disease) (  [place])   . Coronary artery disease   . Hypertension   . Non-insulin treated type 2 diabetes mellitus ([place])   . Pulmonary emboli ([place]) 2011  . RBBB   . Sleep apnea     Tobacco Use: Social History   Tobacco Use  Smoking Status Former Smoker  . Packs/day: 0.50  . Types: Cigarettes  Smokeless Tobacco Never Used  Tobacco Comment   has started smoking again, willing to quit.    Labs: Recent Review Flowsheet Data    Labs for ITP Cardiac and Pulmonary Rehab Latest Ref Rng & Units 08/16/2016   Cholestrol 0 - 200 mg/dL 156   LDLCALC 0 - 99 mg/dL 64   HDL >40 mg/dL 28(L)   Trlycerides <150 mg/dL 322(H)   Hemoglobin A1c 4.8 - 5.6 % 5.9(H)        Exercise Target Goals: Exercise Program Goal: Individual exercise prescription set using results from initial 6 min walk test and THRR while considering  patient's activity barriers and safety.   Exercise Prescription Goal: Initial exercise prescription builds to 30-45 minutes a day of aerobic activity, 2-3 days per week.  Home exercise guidelines will be given to patient during program as part of exercise prescription that the participant will acknowledge.  Activity Barriers & Risk Stratification: Activity Barriers & Cardiac Risk Stratification - 02/03/18 1443      Activity Barriers & Cardiac Risk Stratification   Activity Barriers  Back Problems;Shortness of Breath;Other (comment);Deconditioning;Muscular Weakness;Balance Concerns;Decreased Ventricular Function    Comments  Serotoma on leg, history of laminectomies/back spams,SOB, vertigo    Cardiac Risk Stratification  High       6 Minute Walk: 6 Minute Walk    Row Name 10/04/17 1241 02/03/18 1456       6 Minute Walk   Phase  Initial  Initial    Distance  996 feet  1002 feet    Walk Time  6 minutes  6 minutes    # of Rest Breaks  0  0    MPH  1.89  1.89    METS  2.57  2.49    RPE  13  15    Perceived Dyspnea   -  3    VO2 Peak  8.99  8.74    Symptoms  No  Yes (comment)    Comments  -  back spasm/discomfort 8/10  cramps/claudication thigh 4/5    Resting HR  87 bpm  86 bpm    Resting BP  132/66  124/74    Resting Oxygen Saturation   96 %  98 %    Exercise Oxygen Saturation  during 6 min walk  96 %  95 %    Max Ex. HR  118 bpm  118 bpm    Max Ex. BP  138/64  142/72    2 Minute Post BP  136/64  134/70       Oxygen Initial Assessment:   Oxygen Re-Evaluation:   Oxygen Discharge (Final Oxygen Re-Evaluation):   Initial Exercise Prescription: Initial Exercise Prescription - 02/03/18 1400      Date of Initial Exercise RX and Referring Provider   Date  02/03/18    Referring Provider  Carmin Richmond MD       Treadmill   MPH  1.8    Grade  0.5    Minutes  15    METs  2.5      Recumbant Bike   Level  1    RPM  50    Watts  17    Minutes  15    METs  2.5      T5 Nustep   Level  1    SPM  80    Minutes  15    METs  2.5      Prescription Details   Frequency (times per week)  3    Duration  Progress to 45 minutes of aerobic exercise without signs/symptoms of physical distress      Intensity   THRR 40-80% of Max Heartrate  115-144    Ratings of Perceived Exertion  11-13    Perceived Dyspnea  0-4      Progression   Progression  Continue to progress workloads to maintain intensity without signs/symptoms of physical distress.      Resistance Training   Training Prescription  Yes    Weight  3 lbs    Reps  10-15       Perform Capillary Blood Glucose checks as needed.  Exercise Prescription Changes: Exercise Prescription Changes    Row Name 10/04/17 1200 10/19/17 1500 02/03/18 1400         Response to Exercise   Blood Pressure (Admit)  132/66  100/58  124/74     Blood Pressure (Exercise)  138/64  150/70  142/72     Blood Pressure (Exit)  136/64  100/60  134/70     Heart Rate (Admit)  87 bpm  97 bpm  86 bpm     Heart Rate (Exercise)  118 bpm  115 bpm  118 bpm     Heart Rate (Exit)  90 bpm  103 bpm  80 bpm     Oxygen Saturation (Admit)  96 %  -  98 %     Oxygen Saturation (Exercise)  96 %  -  95 %     Rating of Perceived Exertion (Exercise)  _0 Symptoms  none  none  back spasm/discomfort 8/10, cramps/claudication in thighs 4/5     Comments  walk test results  -  walk test results     Duration  -  Progress to 45 minutes of aerobic exercise without signs/symptoms of physical distress  -     Intensity  -  THRR unchanged  -       Progression   Progression  -  Continue to progress workloads to maintain intensity without signs/symptoms of physical distress.  -     Average METs  -  2.4  -       Resistance Training   Training Prescription  -  Yes  -     Weight   -  3 lb  -     Reps  -  10-15  -       Treadmill   MPH  -  1.8  -     Grade  -  0.5  -     Minutes  -  15  -     METs  -  2.5  -       REL-XR   Level  -  1  -     Speed  -  50  -     Minutes  -  15  -     METs  -  2.3  -        Exercise Comments: Exercise Comments    Row Name 10/06/17 1636  Exercise Comments  First full day of exercise!  Patient was oriented to gym and equipment including functions, settings, policies, and procedures.  Patient's individual exercise prescription and treatment plan were reviewed.  All starting workloads were established based on the results of the 6 minute walk test done at initial orientation visit.  The plan for exercise progression was also introduced and progression will be customized based on patient's performance and goals.          Exercise Goals and Review: Exercise Goals    Row Name 10/04/17 1244 02/03/18 1459           Exercise Goals   Increase Physical Activity  Yes  Yes      Intervention  Provide advice, education, support and counseling about physical activity/exercise needs.;Develop an individualized exercise prescription for aerobic and resistive training based on initial evaluation findings, risk stratification, comorbidities and participant's personal goals.  Provide advice, education, support and counseling about physical activity/exercise needs.;Develop an individualized exercise prescription for aerobic and resistive training based on initial evaluation findings, risk stratification, comorbidities and participant's personal goals.      Expected Outcomes  Short Term: Attend rehab on a regular basis to increase amount of physical activity.;Long Term: Add in home exercise to make exercise part of routine and to increase amount of physical activity.;Long Term: Exercising regularly at least 3-5 days a week.  Short Term: Attend rehab on a regular basis to increase amount of physical activity.;Long Term: Add in home exercise  to make exercise part of routine and to increase amount of physical activity.;Long Term: Exercising regularly at least 3-5 days a week.      Increase Strength and Stamina  Yes  Yes      Intervention  Provide advice, education, support and counseling about physical activity/exercise needs.;Develop an individualized exercise prescription for aerobic and resistive training based on initial evaluation findings, risk stratification, comorbidities and participant's personal goals.  Provide advice, education, support and counseling about physical activity/exercise needs.;Develop an individualized exercise prescription for aerobic and resistive training based on initial evaluation findings, risk stratification, comorbidities and participant's personal goals.      Expected Outcomes  Short Term: Increase workloads from initial exercise prescription for resistance, speed, and METs.;Short Term: Perform resistance training exercises routinely during rehab and add in resistance training at home;Long Term: Improve cardiorespiratory fitness, muscular endurance and strength as measured by increased METs and functional capacity (6MWT)  Short Term: Increase workloads from initial exercise prescription for resistance, speed, and METs.;Short Term: Perform resistance training exercises routinely during rehab and add in resistance training at home;Long Term: Improve cardiorespiratory fitness, muscular endurance and strength as measured by increased METs and functional capacity (6MWT)      Able to understand and use rate of perceived exertion (RPE) scale  Yes  Yes      Intervention  Provide education and explanation on how to use RPE scale  Provide education and explanation on how to use RPE scale      Expected Outcomes  Short Term: Able to use RPE daily in rehab to express subjective intensity level;Long Term:  Able to use RPE to guide intensity level when exercising independently  Short Term: Able to use RPE daily in rehab to  express subjective intensity level;Long Term:  Able to use RPE to guide intensity level when exercising independently      Able to understand and use Dyspnea scale  Yes  Yes      Intervention  Provide education and  explanation on how to use Dyspnea scale  Provide education and explanation on how to use Dyspnea scale      Expected Outcomes  Short Term: Able to use Dyspnea scale daily in rehab to express subjective sense of shortness of breath during exertion;Long Term: Able to use Dyspnea scale to guide intensity level when exercising independently  Short Term: Able to use Dyspnea scale daily in rehab to express subjective sense of shortness of breath during exertion;Long Term: Able to use Dyspnea scale to guide intensity level when exercising independently      Knowledge and understanding of Target Heart Rate Range (THRR)  Yes  Yes      Intervention  Provide education and explanation of THRR including how the numbers were predicted and where they are located for reference  Provide education and explanation of THRR including how the numbers were predicted and where they are located for reference      Expected Outcomes  Short Term: Able to state/look up THRR;Short Term: Able to use daily as guideline for intensity in rehab;Long Term: Able to use THRR to govern intensity when exercising independently  Short Term: Able to state/look up THRR;Short Term: Able to use daily as guideline for intensity in rehab;Long Term: Able to use THRR to govern intensity when exercising independently      Able to check pulse independently  Yes  Yes      Intervention  Provide education and demonstration on how to check pulse in carotid and radial arteries.;Review the importance of being able to check your own pulse for safety during independent exercise  Provide education and demonstration on how to check pulse in carotid and radial arteries.;Review the importance of being able to check your own pulse for safety during independent  exercise      Expected Outcomes  Short Term: Able to explain why pulse checking is important during independent exercise;Long Term: Able to check pulse independently and accurately  Short Term: Able to explain why pulse checking is important during independent exercise;Long Term: Able to check pulse independently and accurately      Understanding of Exercise Prescription  Yes  Yes      Intervention  Provide education, explanation, and written materials on patient's individual exercise prescription  Provide education, explanation, and written materials on patient's individual exercise prescription      Expected Outcomes  Short Term: Able to explain program exercise prescription;Long Term: Able to explain home exercise prescription to exercise independently  Short Term: Able to explain program exercise prescription;Long Term: Able to explain home exercise prescription to exercise independently      Improve claudication pain toleration; Improve walking ability  -  Yes      Intervention  -  Attend education sessions to aid in risk factor modification and understanding of disease process      Expected Outcomes  -  Long Term: Improve walking ability and toleration to claudication         Exercise Goals Re-Evaluation : Exercise Goals Re-Evaluation    [place] Name 10/19/17 1532             Exercise Goal Re-Evaluation   Exercise Goals Review  Increase Physical Activity;Able to understand and use rate of perceived exertion (RPE) scale;Knowledge and understanding of Target Heart Rate Range (THRR);Increase Strength and Stamina       Comments  Jose [name] is tolerating exercise well.  Staff will encourage  increased levels on TM and other equipment.  Expected Outcomes  Short - increase to 2.0/.5 on Tm and L 3 on XR Long - improve overall MET level          Discharge Exercise Prescription (Final Exercise Prescription Changes): Exercise Prescription Changes - 02/03/18 1400      Response to Exercise   Blood  Pressure (Admit)  124/74    Blood Pressure (Exercise)  142/72    Blood Pressure (Exit)  134/70    Heart Rate (Admit)  86 bpm    Heart Rate (Exercise)  118 bpm    Heart Rate (Exit)  80 bpm    Oxygen Saturation (Admit)  98 %    Oxygen Saturation (Exercise)  95 %    Rating of Perceived Exertion (Exercise)  15    Symptoms  back spasm/discomfort 8/10, cramps/claudication in thighs 4/5    Comments  walk test results       Nutrition:  Target Goals: Understanding of nutrition guidelines, daily intake of sodium <158m, cholesterol <2020m calories 30% from fat and 7% or less from saturated fats, daily to have 5 or more servings of fruits and vegetables.  Biometrics: Pre Biometrics - 02/03/18 1500      Pre Biometrics   Height  5' 6.9" (1.699 m)    Weight  239 lb 14.4 oz (108.8 kg)    Waist Circumference  45 inches    Hip Circumference  48 inches    Waist to Hip Ratio  0.94 %    BMI (Calculated)  37.7    Single Leg Stand  4.27 seconds        Nutrition Therapy Plan and Nutrition Goals: Nutrition Therapy & Goals - 10/04/17 1341      Intervention Plan   Intervention  Prescribe, educate and counsel regarding individualized specific dietary modifications aiming towards targeted core components such as weight, hypertension, lipid management, diabetes, heart failure and other comorbidities.;Nutrition handout(s) given to patient.    Expected Outcomes  Short Term Goal: Understand basic principles of dietary content, such as calories, fat, sodium, cholesterol and nutrients.;Short Term Goal: A plan has been developed with personal nutrition goals set during dietitian appointment.;Long Term Goal: Adherence to prescribed nutrition plan.       Nutrition Assessments: Nutrition Assessments - 02/03/18 1439      MEDFICTS Scores   Pre Score  111       Nutrition Goals Re-Evaluation:   Nutrition Goals Discharge (Final Nutrition Goals Re-Evaluation):   Psychosocial: Target Goals: Acknowledge  presence or absence of significant depression and/or stress, maximize coping skills, provide positive support system. Participant is able to verbalize types and ability to use techniques and skills needed for reducing stress and depression.   Initial Review & Psychosocial Screening: Initial Psych Review & Screening - 02/03/18 1434      Initial Review   Current issues with  None Identified      Barriers   Psychosocial barriers to participate in program  There are no identifiable barriers or psychosocial needs.;The patient should benefit from training in stress management and relaxation.      Screening Interventions   Interventions  Encouraged to exercise;To provide support and resources with identified psychosocial needs;Provide feedback about the scores to participant    Expected Outcomes  Short Term goal: Utilizing psychosocial counselor, staff and physician to assist with identification of specific Stressors or current issues interfering with healing process. Setting desired goal for each stressor or current issue identified.;Long Term Goal: Stressors or current issues are controlled or eliminated.;Short Term  goal: Identification and review with participant of any Quality of Life or Depression concerns found by scoring the questionnaire.;Long Term goal: The participant improves quality of Life and PHQ9 Scores as seen by post scores and/or verbalization of changes       Quality of Life Scores:  Quality of Life - 02/03/18 1435      Quality of Life   Select  Quality of Life      Quality of Life Scores   Health/Function Pre  7.87 %    Socioeconomic Pre  14.36 %    Psych/Spiritual Pre  8.5 %    Family Pre  12 %    GLOBAL Pre  9.94 %      Scores of 19 and below usually indicate a poorer quality of life in these areas.  A difference of  2-3 points is a clinically meaningful difference.  A difference of 2-3 points in the total score of the Quality of Life Index has been associated with  significant improvement in overall quality of life, self-image, physical symptoms, and general health in studies assessing change in quality of life.  PHQ-9: Recent Review Flowsheet Data    Depression screen [facility] 2/9 02/03/2018 10/04/2017   Decreased Interest 1 1   Down, Depressed, Hopeless 1 1   PHQ - 2 Score 2 2   Altered sleeping 1 2   Tired, decreased energy 2 3   Change in appetite 2 1   Feeling bad or failure about yourself  1 1   Trouble concentrating 0 0   Moving slowly or fidgety/restless 0 0   Suicidal thoughts 0 0   PHQ-9 Score 8 9   Difficult doing work/chores Somewhat difficult Somewhat difficult     Interpretation of Total Score  Total Score Depression Severity:  1-4 = Minimal depression, 5-9 = Mild depression, 10-14 = Moderate depression, 15-19 = Moderately severe depression, 20-27 = Severe depression   Psychosocial Evaluation and Intervention: Psychosocial Evaluation - 10/13/17 1737      Psychosocial Evaluation & Interventions   Interventions  Encouraged to exercise with the program and follow exercise prescription       Psychosocial Re-Evaluation:   Psychosocial Discharge (Final Psychosocial Re-Evaluation):   Vocational Rehabilitation: Provide vocational rehab assistance to qualifying candidates.   Vocational Rehab Evaluation & Intervention: Vocational Rehab - 02/03/18 1440      Initial Vocational Rehab Evaluation & Intervention   Assessment shows need for Vocational Rehabilitation  No       Education: Education Goals: Education classes will be provided on a variety of topics geared toward better understanding of heart health and risk factor modification. Participant will state understanding/return demonstration of topics presented as noted by education test scores.  Learning Barriers/Preferences: Learning Barriers/Preferences - 02/03/18 1439      Learning Barriers/Preferences   Learning Barriers  None    Learning Preferences  Audio;Skilled  Demonstration;Verbal Instruction;Video;Individual Instruction       Education Topics:  AED/CPR: - Group verbal and written instruction with the use of models to demonstrate the basic use of the AED with the basic ABC's of resuscitation.   General Nutrition Guidelines/Fats and Fiber: -Group instruction provided by verbal, written material, models and posters to present the general guidelines for heart healthy nutrition. Gives an explanation and review of dietary fats and fiber.   Controlling Sodium/Reading Food Labels: -Group verbal and written material supporting the discussion of sodium use in heart healthy nutrition. Review and explanation with models, verbal and written materials  for utilization of the food label.   Exercise Physiology & General Exercise Guidelines: - Group verbal and written instruction with models to review the exercise physiology of the cardiovascular system and associated critical values. Provides general exercise guidelines with specific guidelines to those with heart or lung disease.    Cardiac Rehab from 10/18/2017 in [facility] Cardiac and Pulmonary Rehab  Date  10/13/17  Educator  AS  Instruction Review Code  1- Verbalizes Understanding      Aerobic Exercise & Resistance Training: - Gives group verbal and written instruction on the various components of exercise. Focuses on aerobic and resistive training programs and the benefits of this training and how to safely progress through these programs..   Cardiac Rehab from 10/18/2017 in [facility] Cardiac and Pulmonary Rehab  Date  10/18/17  Educator  [facility]  Instruction Review Code  1- Geologist, engineering, Balance, Mind/Body Relaxation: Provides group verbal/written instruction on the benefits of flexibility and balance training, including mind/body exercise modes such as yoga, pilates and tai chi.  Demonstration and skill practice provided.   Stress and Anxiety: - Provides group verbal and written  instruction about the health risks of elevated stress and causes of high stress.  Discuss the correlation between heart/lung disease and anxiety and treatment options. Review healthy ways to manage with stress and anxiety.   Depression: - Provides group verbal and written instruction on the correlation between heart/lung disease and depressed mood, treatment options, and the stigmas associated with seeking treatment.   Anatomy & Physiology of the Heart: - Group verbal and written instruction and models provide basic cardiac anatomy and physiology, with the coronary electrical and arterial systems. Review of Valvular disease and Heart Failure   Cardiac Procedures: - Group verbal and written instruction to review commonly prescribed medications for heart disease. Reviews the medication, class of the drug, and side effects. Includes the steps to properly store meds and maintain the prescription regimen. (beta blockers and nitrates)   Cardiac Medications I: - Group verbal and written instruction to review commonly prescribed medications for heart disease. Reviews the medication, class of the drug, and side effects. Includes the steps to properly store meds and maintain the prescription regimen.   Cardiac Medications II: -Group verbal and written instruction to review commonly prescribed medications for heart disease. Reviews the medication, class of the drug, and side effects. (all other drug classes)    Go Sex-Intimacy & Heart Disease, Get SMART - Goal Setting: - Group verbal and written instruction through game format to discuss heart disease and the return to sexual intimacy. Provides group verbal and written material to discuss and apply goal setting through the application of the S.M.A.R.T. Method.   Other Matters of the Heart: - Provides group verbal, written materials and models to describe Stable Angina and Peripheral Artery. Includes description of the disease process and treatment  options available to the cardiac patient.   Exercise & Equipment Safety: - Individual verbal instruction and demonstration of equipment use and safety with use of the equipment.   Cardiac Rehab from 10/18/2017 in [facility] Cardiac and Pulmonary Rehab  Date  10/06/17  Educator  Nada Maclachlan  Instruction Review Code  1- Verbalizes Understanding      Infection Prevention: - Provides verbal and written material to individual with discussion of infection control including proper hand washing and proper equipment cleaning during exercise session.   Cardiac Rehab from 10/18/2017 in [facility] Cardiac and Pulmonary Rehab  Date  10/04/17  Educator  [place]  Instruction Review Code  1- Verbalizes Understanding      Falls Prevention: - Provides verbal and written material to individual with discussion of falls prevention and safety.   Cardiac Rehab from 10/18/2017 in [facility] Cardiac and Pulmonary Rehab  Date  10/04/17  Educator  [facility]  Instruction Review Code  1- Verbalizes Understanding      Diabetes: - Individual verbal and written instruction to review signs/symptoms of diabetes, desired ranges of glucose level fasting, after meals and with exercise. Acknowledge that pre and post exercise glucose checks will be done for 3 sessions at entry of program.   Cardiac Rehab from 10/18/2017 in [facility] Cardiac and Pulmonary Rehab  Date  10/04/17  Educator  [facility]  Instruction Review Code  1- Verbalizes Understanding      Know Your Numbers and Risk Factors: -Group verbal and written instruction about important numbers in your health.  Discussion of what are risk factors and how they play a role in the disease process.  Review of Cholesterol, Blood Pressure, Diabetes, and BMI and the role they play in your overall health.   Sleep Hygiene: -Provides group verbal and written instruction about how sleep can affect your health.  Define sleep hygiene, discuss sleep cycles and impact of sleep habits. Review good sleep hygiene tips.     Cardiac Rehab from 10/18/2017 in [facility] Cardiac and Pulmonary Rehab  Date  10/06/17  Educator  Lucianne Lei, MSW  Instruction Review Code  1- Verbalizes Understanding      Other: -Provides group and verbal instruction on various topics (see comments)   Knowledge Questionnaire Score: Knowledge Questionnaire Score - 02/03/18 1439      Knowledge Questionnaire Score   Pre Score  24/26   Correct responses were reviewed with Jose [name] today. He verbalized understanding and had no furhter questions today.      Core Components/Risk Factors/Patient Goals at Admission: Personal Goals and Risk Factors at Admission - 02/03/18 1440      Core Components/Risk Factors/Patient Goals on Admission    Weight Management  Yes;Obesity;Weight Loss    Intervention  Weight Management: Develop a combined nutrition and exercise program designed to reach desired caloric intake, while maintaining appropriate intake of nutrient and fiber, sodium and fats, and appropriate energy expenditure required for the weight goal.;Weight Management: Provide education and appropriate resources to help participant work on and attain dietary goals.;Weight Management/Obesity: Establish reasonable short term and long term weight goals.;Obesity: Provide education and appropriate resources to help participant work on and attain dietary goals.    Admit Weight  239 lb 14.4 oz (108.8 kg)    Goal Weight: Short Term  237 lb (107.5 kg)    Goal Weight: Long Term  200 lb (90.7 kg)    Expected Outcomes  Short Term: Continue to assess and modify interventions until short term weight is achieved;Long Term: Adherence to nutrition and physical activity/exercise program aimed toward attainment of established weight goal;Weight Loss: Understanding of general recommendations for a balanced deficit meal plan, which promotes 1-2 lb weight loss per week and includes a negative energy balance of [phone] kcal/d    Tobacco Cessation  Yes    Number of packs  per day  0.5    Intervention  Assist the participant in steps to quit. Provide individualized education and counseling about committing to Tobacco Cessation, relapse prevention, and pharmacological support that can be provided by physician.;Advice worker, assist with locating and accessing local/national Quit Smoking programs, and  support quit date choice.    Expected Outcomes  Short Term: Will demonstrate readiness to quit, by selecting a quit date.;Short Term: Will quit all tobacco product use, adhering to prevention of relapse plan.;Long Term: Complete abstinence from all tobacco products for at least 12 months from quit date.    Diabetes  Yes    Intervention  Provide education about signs/symptoms and action to take for hypo/hyperglycemia.;Provide education about proper nutrition, including hydration, and aerobic/resistive exercise prescription along with prescribed medications to achieve blood glucose in normal ranges: Fasting glucose 65-99 mg/dL    Expected Outcomes  Short Term: Participant verbalizes understanding of the signs/symptoms and immediate care of hyper/hypoglycemia, proper foot care and importance of medication, aerobic/resistive exercise and nutrition plan for blood glucose control.;Long Term: Attainment of HbA1C < 7%.    Hypertension  Yes    Intervention  Provide education on lifestyle modifcations including regular physical activity/exercise, weight management, moderate sodium restriction and increased consumption of fresh fruit, vegetables, and low fat dairy, alcohol moderation, and smoking cessation.;Monitor prescription use compliance.    Expected Outcomes  Short Term: Continued assessment and intervention until BP is < 140/49m HG in hypertensive participants. < 130/880mHG in hypertensive participants with diabetes, heart failure or chronic kidney disease.;Long Term: Maintenance of blood pressure at goal levels.    Lipids  Yes    Intervention  Provide education and  support for participant on nutrition & aerobic/resistive exercise along with prescribed medications to achieve LDL <7050mHDL >50m36m  Expected Outcomes  Short Term: Participant states understanding of desired cholesterol values and is compliant with medications prescribed. Participant is following exercise prescription and nutrition guidelines.;Long Term: Cholesterol controlled with medications as prescribed, with individualized exercise RX and with personalized nutrition plan. Value goals: LDL < 70mg55mL > 40 mg.       Core Components/Risk Factors/Patient Goals Review:    Core Components/Risk Factors/Patient Goals at Discharge (Final Review):    ITP Comments: ITP Comments    Row Name 10/04/17 1338 02/03/18 1432         ITP Comments  Med Review completed. Initial ITP created. Diagnosis can be found in [facility] 6/5  Readmit after time out for medical reasons. Itp sent to Dr M MilLoleta Chancereview,changes as needed and signature. Documnetation of diagnosis can be found in 01/10/2018 Office Visit.         Comments: Initial ITP for restart

## 2018-02-03 NOTE — Patient Instructions (Signed)
Patient Instructions  Patient Details  Name: Jose [name] MRN: [id number] Date of Birth: 10/18/1955 Referring Provider:  Raquel JamesMoore, Eric S  Below are your personal goals for exercise, nutrition, and risk factors. Our goal is to help you stay on track towards obtaining and maintaining these goals. We will be discussing your progress on these goals with you throughout the program.  Initial Exercise Prescription: Initial Exercise Prescription - 02/03/18 1400      Date of Initial Exercise RX and Referring Provider   Date  02/03/18    Referring Provider  Gerrianne ScaleMoore, Eric Stephen MD      Treadmill   MPH  1.8    Grade  0.5    Minutes  15    METs  2.5      Recumbant Bike   Level  1    RPM  50    Watts  17    Minutes  15    METs  2.5      T5 Nustep   Level  1    SPM  80    Minutes  15    METs  2.5      Prescription Details   Frequency (times per week)  3    Duration  Progress to 45 minutes of aerobic exercise without signs/symptoms of physical distress      Intensity   THRR 40-80% of Max Heartrate  115-144    Ratings of Perceived Exertion  11-13    Perceived Dyspnea  0-4      Progression   Progression  Continue to progress workloads to maintain intensity without signs/symptoms of physical distress.      Resistance Training   Training Prescription  Yes    Weight  3 lbs    Reps  10-15       Exercise Goals: Frequency: Be able to perform aerobic exercise two to three times per week in program working toward 2-5 days per week of home exercise.  Intensity: Work with a perceived exertion of 11 (fairly light) - 15 (hard) while following your exercise prescription.  We will make changes to your prescription with you as you progress through the program.   Duration: Be able to do 30 to 45 minutes of continuous aerobic exercise in addition to a 5 minute warm-up and a 5 minute cool-down routine.   Nutrition Goals: Your personal nutrition goals will be established when you do your  nutrition analysis with the dietician.  The following are general nutrition guidelines to follow: Cholesterol < 200mg /day Sodium < 1500mg /day Fiber: Men over 50 yrs - 30 grams per day  Personal Goals: Personal Goals and Risk Factors at Admission - 02/03/18 1440      Core Components/Risk Factors/Patient Goals on Admission    Weight Management  Yes;Obesity;Weight Loss    Intervention  Weight Management: Develop a combined nutrition and exercise program designed to reach desired caloric intake, while maintaining appropriate intake of nutrient and fiber, sodium and fats, and appropriate energy expenditure required for the weight goal.;Weight Management: Provide education and appropriate resources to help participant work on and attain dietary goals.;Weight Management/Obesity: Establish reasonable short term and long term weight goals.;Obesity: Provide education and appropriate resources to help participant work on and attain dietary goals.    Admit Weight  239 lb 14.4 oz (108.8 kg)    Goal Weight: Short Term  237 lb (107.5 kg)    Goal Weight: Long Term  200 lb (90.7 kg)    Expected Outcomes  Short Term: Continue to assess and modify interventions until short term weight is achieved;Long Term: Adherence to nutrition and physical activity/exercise program aimed toward attainment of established weight goal;Weight Loss: Understanding of general recommendations for a balanced deficit meal plan, which promotes 1-2 lb weight loss per week and includes a negative energy balance of [phone] kcal/d    Tobacco Cessation  Yes    Number of packs per day  0.5    Intervention  Assist the participant in steps to quit. Provide individualized education and counseling about committing to Tobacco Cessation, relapse prevention, and pharmacological support that can be provided by physician.;Education officer, environmental, assist with locating and accessing local/national Quit Smoking programs, and support quit date choice.     Expected Outcomes  Short Term: Will demonstrate readiness to quit, by selecting a quit date.;Short Term: Will quit all tobacco product use, adhering to prevention of relapse plan.;Long Term: Complete abstinence from all tobacco products for at least 12 months from quit date.    Diabetes  Yes    Intervention  Provide education about signs/symptoms and action to take for hypo/hyperglycemia.;Provide education about proper nutrition, including hydration, and aerobic/resistive exercise prescription along with prescribed medications to achieve blood glucose in normal ranges: Fasting glucose 65-99 mg/dL    Expected Outcomes  Short Term: Participant verbalizes understanding of the signs/symptoms and immediate care of hyper/hypoglycemia, proper foot care and importance of medication, aerobic/resistive exercise and nutrition plan for blood glucose control.;Long Term: Attainment of HbA1C < 7%.    Hypertension  Yes    Intervention  Provide education on lifestyle modifcations including regular physical activity/exercise, weight management, moderate sodium restriction and increased consumption of fresh fruit, vegetables, and low fat dairy, alcohol moderation, and smoking cessation.;Monitor prescription use compliance.    Expected Outcomes  Short Term: Continued assessment and intervention until BP is < 140/108mm HG in hypertensive participants. < 130/8mm HG in hypertensive participants with diabetes, heart failure or chronic kidney disease.;Long Term: Maintenance of blood pressure at goal levels.    Lipids  Yes    Intervention  Provide education and support for participant on nutrition & aerobic/resistive exercise along with prescribed medications to achieve LDL 70mg , HDL >40mg .    Expected Outcomes  Short Term: Participant states understanding of desired cholesterol values and is compliant with medications prescribed. Participant is following exercise prescription and nutrition guidelines.;Long Term: Cholesterol  controlled with medications as prescribed, with individualized exercise RX and with personalized nutrition plan. Value goals: LDL < 70mg , HDL > 40 mg.       Tobacco Use Initial Evaluation: Social History   Tobacco Use  Smoking Status Former Smoker  . Packs/day: 0.50  . Types: Cigarettes  Smokeless Tobacco Never Used  Tobacco Comment   has started smoking again, willing to quit.    Exercise Goals and Review: Exercise Goals    Row Name 10/04/17 1244 02/03/18 1459           Exercise Goals   Increase Physical Activity  Yes  Yes      Intervention  Provide advice, education, support and counseling about physical activity/exercise needs.;Develop an individualized exercise prescription for aerobic and resistive training based on initial evaluation findings, risk stratification, comorbidities and participant's personal goals.  Provide advice, education, support and counseling about physical activity/exercise needs.;Develop an individualized exercise prescription for aerobic and resistive training based on initial evaluation findings, risk stratification, comorbidities and participant's personal goals.      Expected Outcomes  Short Term: Attend rehab on a  regular basis to increase amount of physical activity.;Long Term: Add in home exercise to make exercise part of routine and to increase amount of physical activity.;Long Term: Exercising regularly at least 3-5 days a week.  Short Term: Attend rehab on a regular basis to increase amount of physical activity.;Long Term: Add in home exercise to make exercise part of routine and to increase amount of physical activity.;Long Term: Exercising regularly at least 3-5 days a week.      Increase Strength and Stamina  Yes  Yes      Intervention  Provide advice, education, support and counseling about physical activity/exercise needs.;Develop an individualized exercise prescription for aerobic and resistive training based on initial evaluation findings, risk  stratification, comorbidities and participant's personal goals.  Provide advice, education, support and counseling about physical activity/exercise needs.;Develop an individualized exercise prescription for aerobic and resistive training based on initial evaluation findings, risk stratification, comorbidities and participant's personal goals.      Expected Outcomes  Short Term: Increase workloads from initial exercise prescription for resistance, speed, and METs.;Short Term: Perform resistance training exercises routinely during rehab and add in resistance training at home;Long Term: Improve cardiorespiratory fitness, muscular endurance and strength as measured by increased METs and functional capacity ( )  Short Term: Increase workloads from initial exercise prescription for resistance, speed, and METs.;Short Term: Perform resistance training exercises routinely during rehab and add in resistance training at home;Long Term: Improve cardiorespiratory fitness, muscular endurance and strength as measured by increased METs and functional capacity ( )      Able to understand and use rate of perceived exertion (RPE) scale  Yes  Yes      Intervention  Provide education and explanation on how to use RPE scale  Provide education and explanation on how to use RPE scale      Expected Outcomes  Short Term: Able to use RPE daily in rehab to express subjective intensity level;Long Term:  Able to use RPE to guide intensity level when exercising independently  Short Term: Able to use RPE daily in rehab to express subjective intensity level;Long Term:  Able to use RPE to guide intensity level when exercising independently      Able to understand and use Dyspnea scale  Yes  Yes      Intervention  Provide education and explanation on how to use Dyspnea scale  Provide education and explanation on how to use Dyspnea scale      Expected Outcomes  Short Term: Able to use Dyspnea scale daily in rehab to express subjective  sense of shortness of breath during exertion;Long Term: Able to use Dyspnea scale to guide intensity level when exercising independently  Short Term: Able to use Dyspnea scale daily in rehab to express subjective sense of shortness of breath during exertion;Long Term: Able to use Dyspnea scale to guide intensity level when exercising independently      Knowledge and understanding of Target Heart Rate Range (THRR)  Yes  Yes      Intervention  Provide education and explanation of THRR including how the numbers were predicted and where they are located for reference  Provide education and explanation of THRR including how the numbers were predicted and where they are located for reference      Expected Outcomes  Short Term: Able to state/look up THRR;Short Term: Able to use daily as guideline for intensity in rehab;Long Term: Able to use THRR to govern intensity when exercising independently  Short Term: Able to state/look up THRR;Short  Term: Able to use daily as guideline for intensity in rehab;Long Term: Able to use THRR to govern intensity when exercising independently      Able to check pulse independently  Yes  Yes      Intervention  Provide education and demonstration on how to check pulse in carotid and radial arteries.;Review the importance of being able to check your own pulse for safety during independent exercise  Provide education and demonstration on how to check pulse in carotid and radial arteries.;Review the importance of being able to check your own pulse for safety during independent exercise      Expected Outcomes  Short Term: Able to explain why pulse checking is important during independent exercise;Long Term: Able to check pulse independently and accurately  Short Term: Able to explain why pulse checking is important during independent exercise;Long Term: Able to check pulse independently and accurately      Understanding of Exercise Prescription  Yes  Yes      Intervention  Provide  education, explanation, and written materials on patient's individual exercise prescription  Provide education, explanation, and written materials on patient's individual exercise prescription      Expected Outcomes  Short Term: Able to explain program exercise prescription;Long Term: Able to explain home exercise prescription to exercise independently  Short Term: Able to explain program exercise prescription;Long Term: Able to explain home exercise prescription to exercise independently      Improve claudication pain toleration; Improve walking ability  -  Yes      Intervention  -  Attend education sessions to aid in risk factor modification and understanding of disease process      Expected Outcomes  -  Long Term: Improve walking ability and toleration to claudication         Copy of goals given to participant.

## 2018-02-07 ENCOUNTER — Encounter: Payer: BLUE CROSS/BLUE SHIELD | Admitting: *Deleted

## 2018-02-07 DIAGNOSIS — Z951 Presence of aortocoronary bypass graft: Secondary | ICD-10-CM | POA: Diagnosis not present

## 2018-02-07 LAB — GLUCOSE, CAPILLARY
Glucose-Capillary: 111 mg/dL — ABNORMAL HIGH (ref 70–99)
Glucose-Capillary: 101 mg/dL — ABNORMAL HIGH (ref 70–99)

## 2018-02-07 NOTE — Progress Notes (Signed)
Daily Session Note  Patient Details  Name: Jose [name] MRN: [id number] Date of Birth: 1955/05/11 Referring Provider:     Cardiac Rehab from 02/03/2018 in [facility] Cardiac and Pulmonary Rehab  Referring Provider  Carmin Richmond MD      Encounter Date: 02/07/2018  Check In: Session Check In - 02/07/18 1740      Check-In   Supervising physician immediately available to respond to emergencies  See telemetry face sheet for immediately available ER MD    Staff Present  Earlean Shawl, BS, ACSM CEP, Exercise Physiologist;Carroll Enterkin, RN, Vickki Hearing, BA, ACSM CEP, Exercise Physiologist    Medication changes reported      No    Fall or balance concerns reported     No    Tobacco Cessation  No Change    Warm-up and Cool-down  Performed as group-led instruction    Resistance Training Performed  Yes    VAD Patient?  No    PAD/SET Patient?  No      Pain Assessment   Currently in Pain?  No/denies    Multiple Pain Sites  No          Social History   Tobacco Use  Smoking Status Former Smoker  . Packs/day: 0.50  . Types: Cigarettes  Smokeless Tobacco Never Used  Tobacco Comment   has started smoking again, willing to quit.    Goals Met:  Independence with exercise equipment Exercise tolerated well Personal goals reviewed No report of cardiac concerns or symptoms Strength training completed today  Goals Unmet:  Not Applicable  Comments: First full day of exercise!  Patient was oriented to gym and equipment including functions, settings, policies, and procedures.  Patient's individual exercise prescription and treatment plan were reviewed.  All starting workloads were established based on the results of the 6 minute walk test done at initial orientation visit.  The plan for exercise progression was also introduced and progression will be customized based on patient's performance and goals.     Dr. Emily Filbert is Medical Director for [facility].

## 2018-02-08 ENCOUNTER — Encounter: Payer: Self-pay | Admitting: *Deleted

## 2018-02-14 ENCOUNTER — Encounter: Payer: BLUE CROSS/BLUE SHIELD | Attending: Family Medicine

## 2018-02-14 DIAGNOSIS — Z7951 Long term (current) use of inhaled steroids: Secondary | ICD-10-CM | POA: Diagnosis not present

## 2018-02-14 DIAGNOSIS — D6861 Antiphospholipid syndrome: Secondary | ICD-10-CM | POA: Insufficient documentation

## 2018-02-14 DIAGNOSIS — Z7982 Long term (current) use of aspirin: Secondary | ICD-10-CM | POA: Diagnosis not present

## 2018-02-14 DIAGNOSIS — Z7901 Long term (current) use of anticoagulants: Secondary | ICD-10-CM | POA: Insufficient documentation

## 2018-02-14 DIAGNOSIS — Z951 Presence of aortocoronary bypass graft: Secondary | ICD-10-CM | POA: Insufficient documentation

## 2018-02-14 DIAGNOSIS — Z79899 Other long term (current) drug therapy: Secondary | ICD-10-CM | POA: Diagnosis not present

## 2018-02-14 DIAGNOSIS — E119 Type 2 diabetes mellitus without complications: Secondary | ICD-10-CM | POA: Insufficient documentation

## 2018-02-14 DIAGNOSIS — I10 Essential (primary) hypertension: Secondary | ICD-10-CM | POA: Insufficient documentation

## 2018-02-14 DIAGNOSIS — I2699 Other pulmonary embolism without acute cor pulmonale: Secondary | ICD-10-CM | POA: Diagnosis not present

## 2018-02-14 DIAGNOSIS — Z79891 Long term (current) use of opiate analgesic: Secondary | ICD-10-CM | POA: Diagnosis not present

## 2018-02-14 DIAGNOSIS — J449 Chronic obstructive pulmonary disease, unspecified: Secondary | ICD-10-CM | POA: Diagnosis not present

## 2018-02-14 DIAGNOSIS — Z87891 Personal history of nicotine dependence: Secondary | ICD-10-CM | POA: Insufficient documentation

## 2018-02-14 LAB — GLUCOSE, CAPILLARY
GLUCOSE-CAPILLARY: 88 mg/dL (ref 70–99)
GLUCOSE-CAPILLARY: 103 mg/dL — AB (ref 70–99)

## 2018-02-14 NOTE — Progress Notes (Signed)
Daily Session Note  Patient Details  Name: Amadi Frady MRN: [id number] Date of Birth: May 17, 1955 Referring Provider:     Cardiac Rehab from 02/03/2018 in [facility]  Referring Provider  Carmin Richmond MD      Encounter Date: 02/14/2018  Check In: Session Check In - 02/14/18 1751      Check-In   Supervising physician immediately available to respond to emergencies  See telemetry face sheet for immediately available ER MD    Location  [facility]-Cardiac & Pulmonary Rehab    Staff Present  Gerlene Burdock, RN, Moises Blood, BS, ACSM CEP, Exercise Physiologist;Keysi Oelkers Oletta Darter, BA, ACSM CEP, Exercise Physiologist    Medication changes reported      No    Fall or balance concerns reported     No    Warm-up and Cool-down  Performed as group-led instruction    Resistance Training Performed  Yes    VAD Patient?  No    PAD/SET Patient?  No      Pain Assessment   Currently in Pain?  No/denies    Multiple Pain Sites  No          Social History   Tobacco Use  Smoking Status Former Smoker  . Packs/day: 0.50  . Types: Cigarettes  Smokeless Tobacco Never Used  Tobacco Comment   has started smoking again, willing to quit.    Goals Met:  Independence with exercise equipment Exercise tolerated well No report of cardiac concerns or symptoms Strength training completed today  Goals Unmet:  Not Applicable  Comments: Pt able to follow exercise prescription today without complaint.  Will continue to monitor for progression.    Dr. Emily Filbert is Medical Director for [facility].

## 2018-02-16 ENCOUNTER — Encounter: Payer: Self-pay | Admitting: *Deleted

## 2018-02-16 DIAGNOSIS — Z951 Presence of aortocoronary bypass graft: Secondary | ICD-10-CM | POA: Diagnosis not present

## 2018-02-16 LAB — GLUCOSE, CAPILLARY: Glucose-Capillary: 92 mg/dL (ref 70–99)

## 2018-02-16 NOTE — Progress Notes (Signed)
Daily Session Note  Patient Details  Name: Jose [name] MRN: [id number] Date of Birth: December 04, 1955 Referring Provider:     Cardiac Rehab from 02/03/2018 in [facility]  Referring Provider  Carmin Richmond MD      Encounter Date: 02/16/2018  Check In: Session Check In - 02/16/18 1735      Check-In   Supervising physician immediately available to respond to emergencies  See telemetry face sheet for immediately available ER MD    Location  [facility] Pul[facility]    Staff Present  Renita Papa, RN BSN;Susanne Bice, RN, BSN, CCRP;Francely Craw, BA, ACSM CEP, Exercise Physiologist    Medication changes reported      No    Fall or balance concerns reported     No    Warm-up and Cool-down  Performed as group-led instruction    Resistance Training Performed  Yes    VAD Patient?  No    PAD/SET Patient?  No      Pain Assessment   Currently in Pain?  No/denies    Multiple Pain Sites  No          Social History   Tobacco Use  Smoking Status Former Smoker  . Packs/day: 0.50  . Types: Cigarettes  Smokeless Tobacco Never Used  Tobacco Comment   has started smoking again, willing to quit.    Goals Met:  Independence with exercise equipment Exercise tolerated well No report of cardiac concerns or symptoms Strength training completed today  Goals Unmet:  Not Applicable  Comments: Pt able to follow exercise prescription today without complaint.  Will continue to monitor for progression.    Dr. Emily Filbert is Medical Director for [facility].

## 2018-02-16 NOTE — Progress Notes (Signed)
Cardiac Individual Treatment Plan  Patient Details  Name: Jose [name] MRN: [id number] Date of Birth: 10/29/1955 Referring Provider:     Cardiac Rehab from 02/03/2018 in [facility]  Referring Provider  Carmin Richmond MD      Initial Encounter Date:    Cardiac Rehab from 02/03/2018 in [facility] Cardiac and Pulmonary Rehab  Date  02/03/18      Visit Diagnosis: S/P CABG x 3  Patient's Home Medications on Admission:  Current Outpatient Medications:  .  ALPRAZolam (XANAX) 0.5 MG tablet, TAKE ONE TABLET (0.5 MG TOTAL) BY MOUTH 3 (THREE) TIMES A DAY AS NEEDED FOR SLEEP OR ANXIETY., Disp: , Rfl:  .  aspirin EC 81 MG tablet, Take 81 mg by mouth daily., Disp: , Rfl:  .  Blood Glucose Monitoring Suppl (GLUCOCOM BLOOD GLUCOSE MONITOR) DEVI, 1 each by XX route as directed (For Blood Glucose Monitoring), Disp: , Rfl:  .  colchicine 0.6 MG tablet, Take 1 tablet (0.6 mg total) by mouth 2 (two) times daily. (Patient not taking: Reported on 10/04/2017), Disp: 60 tablet, Rfl: 2 .  furosemide (LASIX) 40 MG tablet, Take 1 tablet (40 mg total) by mouth daily. (Patient not taking: Reported on 10/04/2017), Disp: 30 tablet, Rfl: 2 .  glucose blood (PRECISION QID TEST) test strip, Use 1 each as directed (For Blood Glucose Monitoring 4 times daily) Use as instructed., Disp: , Rfl:  .  isosorbide mononitrate (IMDUR) 60 MG 24 hr tablet, Take 1 tablet (60 mg total) by mouth daily. (Patient not taking: Reported on 10/04/2017), Disp: 30 tablet, Rfl: 1 .  metoprolol succinate (TOPROL-XL) 100 MG 24 hr tablet, Take 100 mg by mouth daily., Disp: , Rfl: 3 .  metoprolol tartrate (LOPRESSOR) 12.5 mg TABS tablet, Take by mouth., Disp: , Rfl:  .  nitroGLYCERIN (NITROSTAT) 0.4 MG SL tablet, Place 0.4 mg under the tongue every 5 (five) minutes as needed for chest pain. , Disp: , Rfl: 1 .  pantoprazole (PROTONIX) 40 MG tablet, Take 40 mg by mouth 2 (two) times daily., Disp: , Rfl: 3 .  polyethylene glycol  (MIRALAX / GLYCOLAX) packet, Take 17 g by mouth daily. (Patient not taking: Reported on 10/04/2017), Disp: 14 each, Rfl: 0 .  potassium chloride (MICRO-K) 10 MEQ CR capsule, Take by mouth., Disp: , Rfl:  .  PROAIR HFA 108 (90 Base) MCG/ACT inhaler, Inhale 1-2 puffs into the lungs every 4 (four) hours as needed for wheezing. , Disp: , Rfl: 12 .  rosuvastatin (CRESTOR) 10 MG tablet, Take 10 mg by mouth every evening., Disp: , Rfl: 11 .  tamsulosin (FLOMAX) 0.4 MG CAPS capsule, Take 0.4 mg by mouth daily., Disp: , Rfl: 0 .  Tiotropium Bromide-Olodaterol (STIOLTO RESPIMAT) 2.5-2.5 MCG/ACT AERS, Inhale into the lungs., Disp: , Rfl:  .  topiramate (TOPAMAX) 50 MG tablet, Take 50 mg by mouth 2 (two) times daily., Disp: , Rfl: 0 .  torsemide (DEMADEX) 20 MG tablet, Take by mouth., Disp: , Rfl:  .  warfarin (COUMADIN) 5 MG tablet, Take 5-7.5 mg by mouth See admin instructions. Take 7.5 mg on Monday, Wednesday and Friday then take 5 mg all the other days, Disp: , Rfl: 0  Past Medical History: Past Medical History:  Diagnosis Date  . Antiphospholipid antibody syndrome ([place])   . CAD S/P percutaneous coronary angioplasty '98, '04, Feb 2017   [facility]  . Carotid arterial disease ([place]) 10/2015   moderate bilateral  . COPD (chronic obstructive pulmonary disease) (  [place])   . Coronary artery disease   . Hypertension   . Non-insulin treated type 2 diabetes mellitus ([place])   . Pulmonary emboli ([place]) 2011  . RBBB   . Sleep apnea     Tobacco Use: Social History   Tobacco Use  Smoking Status Former Smoker  . Packs/day: 0.50  . Types: Cigarettes  Smokeless Tobacco Never Used  Tobacco Comment   has started smoking again, willing to quit.    Labs: Recent Review Flowsheet Data    Labs for ITP Cardiac and Pulmonary Rehab Latest Ref Rng & Units 08/16/2016   Cholestrol 0 - 200 mg/dL 156   LDLCALC 0 - 99 mg/dL 64   HDL >40 mg/dL 28(L)   Trlycerides <150 mg/dL 322(H)   Hemoglobin A1c 4.8 - 5.6 % 5.9(H)        Exercise Target Goals: Exercise Program Goal: Individual exercise prescription set using results from initial 6 min walk test and THRR while considering  patient's activity barriers and safety.   Exercise Prescription Goal: Initial exercise prescription builds to 30-45 minutes a day of aerobic activity, 2-3 days per week.  Home exercise guidelines will be given to patient during program as part of exercise prescription that the participant will acknowledge.  Activity Barriers & Risk Stratification: Activity Barriers & Cardiac Risk Stratification - 02/03/18 1443      Activity Barriers & Cardiac Risk Stratification   Activity Barriers  Back Problems;Shortness of Breath;Other (comment);Deconditioning;Muscular Weakness;Balance Concerns;Decreased Ventricular Function    Comments  Serotoma on leg, history of laminectomies/back spams,SOB, vertigo    Cardiac Risk Stratification  High       6 Minute Walk: 6 Minute Walk    Row Name 02/03/18 1456         6 Minute Walk   Phase  Initial     Distance  1002 feet     Walk Time  6 minutes     # of Rest Breaks  0     MPH  1.89     METS  2.49     RPE  15     Perceived Dyspnea   3     VO2 Peak  8.74     Symptoms  Yes (comment)     Comments  back spasm/discomfort 8/10  cramps/claudication thigh 4/5     Resting HR  86 bpm     Resting BP  124/74     Resting Oxygen Saturation   98 %     Exercise Oxygen Saturation  during 6 min walk  95 %     Max Ex. HR  118 bpm     Max Ex. BP  142/72     2 Minute Post BP  134/70        Oxygen Initial Assessment:   Oxygen Re-Evaluation:   Oxygen Discharge (Final Oxygen Re-Evaluation):   Initial Exercise Prescription: Initial Exercise Prescription - 02/03/18 1400      Date of Initial Exercise RX and Referring Provider   Date  02/03/18    Referring Provider  Carmin Richmond MD      Treadmill   MPH  1.8    Grade  0.5    Minutes  15    METs  2.5      Recumbant Bike   Level  1     RPM  50    Watts  17    Minutes  15    METs  2.5  T5 Nustep   Level  1    SPM  80    Minutes  15    METs  2.5      Prescription Details   Frequency (times per week)  3    Duration  Progress to 45 minutes of aerobic exercise without signs/symptoms of physical distress      Intensity   THRR 40-80% of Max Heartrate  115-144    Ratings of Perceived Exertion  11-13    Perceived Dyspnea  0-4      Progression   Progression  Continue to progress workloads to maintain intensity without signs/symptoms of physical distress.      Resistance Training   Training Prescription  Yes    Weight  3 lbs    Reps  10-15       Perform Capillary Blood Glucose checks as needed.  Exercise Prescription Changes: Exercise Prescription Changes    Row Name 02/03/18 1400 02/08/18 0800           Response to Exercise   Blood Pressure (Admit)  124/74  124/70      Blood Pressure (Exercise)  142/72  142/74      Blood Pressure (Exit)  134/70  102/70      Heart Rate (Admit)  86 bpm  60 bpm      Heart Rate (Exercise)  118 bpm  111 bpm      Heart Rate (Exit)  80 bpm  90 bpm      Oxygen Saturation (Admit)  98 %  -      Oxygen Saturation (Exercise)  95 %  -      Rating of Perceived Exertion (Exercise)  15  12      Symptoms  back spasm/discomfort 8/10, cramps/claudication in thighs 4/5  none      Comments  walk test results  first day exercise      Duration  -  Progress to 45 minutes of aerobic exercise without signs/symptoms of physical distress      Intensity  -  THRR unchanged        Progression   Progression  -  Continue to progress workloads to maintain intensity without signs/symptoms of physical distress.      Average METs  -  2.5        Resistance Training   Training Prescription  -  Yes      Weight  -  3 lbs      Reps  -  10-15        Interval Training   Interval Training  -  No        Recumbant Bike   Level  -  1      RPM  -  60      Watts  -  17      Minutes  -  15      METs   -  2.49         Exercise Comments: Exercise Comments    Row Name 02/07/18 1741           Exercise Comments   First full day of exercise!  Patient was oriented to gym and equipment including functions, settings, policies, and procedures.  Patient's individual exercise prescription and treatment plan were reviewed.  All starting workloads were established based on the results of the 6 minute walk test done at initial orientation visit.  The plan for exercise progression was also introduced and progression will be  customized based on patient's performance and goals.          Exercise Goals and Review: Exercise Goals    Row Name 02/03/18 1459             Exercise Goals   Increase Physical Activity  Yes       Intervention  Provide advice, education, support and counseling about physical activity/exercise needs.;Develop an individualized exercise prescription for aerobic and resistive training based on initial evaluation findings, risk stratification, comorbidities and participant's personal goals.       Expected Outcomes  Short Term: Attend rehab on a regular basis to increase amount of physical activity.;Long Term: Add in home exercise to make exercise part of routine and to increase amount of physical activity.;Long Term: Exercising regularly at least 3-5 days a week.       Increase Strength and Stamina  Yes       Intervention  Provide advice, education, support and counseling about physical activity/exercise needs.;Develop an individualized exercise prescription for aerobic and resistive training based on initial evaluation findings, risk stratification, comorbidities and participant's personal goals.       Expected Outcomes  Short Term: Increase workloads from initial exercise prescription for resistance, speed, and METs.;Short Term: Perform resistance training exercises routinely during rehab and add in resistance training at home;Long Term: Improve cardiorespiratory fitness, muscular  endurance and strength as measured by increased METs and functional capacity (6MWT)       Able to understand and use rate of perceived exertion (RPE) scale  Yes       Intervention  Provide education and explanation on how to use RPE scale       Expected Outcomes  Short Term: Able to use RPE daily in rehab to express subjective intensity level;Long Term:  Able to use RPE to guide intensity level when exercising independently       Able to understand and use Dyspnea scale  Yes       Intervention  Provide education and explanation on how to use Dyspnea scale       Expected Outcomes  Short Term: Able to use Dyspnea scale daily in rehab to express subjective sense of shortness of breath during exertion;Long Term: Able to use Dyspnea scale to guide intensity level when exercising independently       Knowledge and understanding of Target Heart Rate Range (THRR)  Yes       Intervention  Provide education and explanation of THRR including how the numbers were predicted and where they are located for reference       Expected Outcomes  Short Term: Able to state/look up THRR;Short Term: Able to use daily as guideline for intensity in rehab;Long Term: Able to use THRR to govern intensity when exercising independently       Able to check pulse independently  Yes       Intervention  Provide education and demonstration on how to check pulse in carotid and radial arteries.;Review the importance of being able to check your own pulse for safety during independent exercise       Expected Outcomes  Short Term: Able to explain why pulse checking is important during independent exercise;Long Term: Able to check pulse independently and accurately       Understanding of Exercise Prescription  Yes       Intervention  Provide education, explanation, and written materials on patient's individual exercise prescription       Expected Outcomes  Short Term: Able to explain  program exercise prescription;Long Term: Able to explain  home exercise prescription to exercise independently       Improve claudication pain toleration; Improve walking ability  Yes       Intervention  Attend education sessions to aid in risk factor modification and understanding of disease process       Expected Outcomes  Long Term: Improve walking ability and toleration to claudication          Exercise Goals Re-Evaluation : Exercise Goals Re-Evaluation    Row Name 02/07/18 1741             Exercise Goal Re-Evaluation   Exercise Goals Review  Increase Physical Activity;Able to understand and use rate of perceived exertion (RPE) scale;Knowledge and understanding of Target Heart Rate Range (THRR);Understanding of Exercise Prescription;Increase Strength and Stamina       Comments  Reviewed RPE scale, THR and program prescription with pt today.  Pt voiced understanding and was given a copy of goals to take home.        Expected Outcomes  Short: Use RPE daily to regulate intensity. Long: Follow program prescription in THR.          Discharge Exercise Prescription (Final Exercise Prescription Changes): Exercise Prescription Changes - 02/08/18 0800      Response to Exercise   Blood Pressure (Admit)  124/70    Blood Pressure (Exercise)  142/74    Blood Pressure (Exit)  102/70    Heart Rate (Admit)  60 bpm    Heart Rate (Exercise)  111 bpm    Heart Rate (Exit)  90 bpm    Rating of Perceived Exertion (Exercise)  12    Symptoms  none    Comments  first day exercise    Duration  Progress to 45 minutes of aerobic exercise without signs/symptoms of physical distress    Intensity  THRR unchanged      Progression   Progression  Continue to progress workloads to maintain intensity without signs/symptoms of physical distress.    Average METs  2.5      Resistance Training   Training Prescription  Yes    Weight  3 lbs    Reps  10-15      Interval Training   Interval Training  No      Recumbant Bike   Level  1    RPM  60    Watts  17     Minutes  15    METs  2.49       Nutrition:  Target Goals: Understanding of nutrition guidelines, daily intake of sodium '1500mg'$ , cholesterol '200mg'$ , calories 30% from fat and 7% or less from saturated fats, daily to have 5 or more servings of fruits and vegetables.  Biometrics: Pre Biometrics - 02/03/18 1500      Pre Biometrics   Height  5' 6.9" (1.699 m)    Weight  239 lb 14.4 oz (108.8 kg)    Waist Circumference  45 inches    Hip Circumference  48 inches    Waist to Hip Ratio  0.94 %    BMI (Calculated)  37.7    Single Leg Stand  4.27 seconds        Nutrition Therapy Plan and Nutrition Goals:   Nutrition Assessments: Nutrition Assessments - 02/03/18 1439      MEDFICTS Scores   Pre Score  111       Nutrition Goals Re-Evaluation:   Nutrition Goals Discharge (Final Nutrition Goals Re-Evaluation):  Psychosocial: Target Goals: Acknowledge presence or absence of significant depression and/or stress, maximize coping skills, provide positive support system. Participant is able to verbalize types and ability to use techniques and skills needed for reducing stress and depression.   Initial Review & Psychosocial Screening: Initial Psych Review & Screening - 02/03/18 1434      Initial Review   Current issues with  None Identified      Barriers   Psychosocial barriers to participate in program  There are no identifiable barriers or psychosocial needs.;The patient should benefit from training in stress management and relaxation.      Screening Interventions   Interventions  Encouraged to exercise;To provide support and resources with identified psychosocial needs;Provide feedback about the scores to participant    Expected Outcomes  Short Term goal: Utilizing psychosocial counselor, staff and physician to assist with identification of specific Stressors or current issues interfering with healing process. Setting desired goal for each stressor or current issue  identified.;Long Term Goal: Stressors or current issues are controlled or eliminated.;Short Term goal: Identification and review with participant of any Quality of Life or Depression concerns found by scoring the questionnaire.;Long Term goal: The participant improves quality of Life and PHQ9 Scores as seen by post scores and/or verbalization of changes       Quality of Life Scores:  Quality of Life - 02/03/18 1435      Quality of Life   Select  Quality of Life      Quality of Life Scores   Health/Function Pre  7.87 %    Socioeconomic Pre  14.36 %    Psych/Spiritual Pre  8.5 %    Family Pre  12 %    GLOBAL Pre  9.94 %      Scores of 19 and below usually indicate a poorer quality of life in these areas.  A difference of  2-3 points is a clinically meaningful difference.  A difference of 2-3 points in the total score of the Quality of Life Index has been associated with significant improvement in overall quality of life, self-image, physical symptoms, and general health in studies assessing change in quality of life.  PHQ-9: Recent Review Flowsheet Data    Depression screen [facility] 2/9 02/03/2018 10/04/2017   Decreased Interest 1 1   Down, Depressed, Hopeless 1 1   PHQ - 2 Score 2 2   Altered sleeping 1 2   Tired, decreased energy 2 3   Change in appetite 2 1   Feeling bad or failure about yourself  1 1   Trouble concentrating 0 0   Moving slowly or fidgety/restless 0 0   Suicidal thoughts 0 0   PHQ-9 Score 8 9   Difficult doing work/chores Somewhat difficult Somewhat difficult     Interpretation of Total Score  Total Score Depression Severity:  1-4 = Minimal depression, 5-9 = Mild depression, 10-14 = Moderate depression, 15-19 = Moderately severe depression, 20-27 = Severe depression   Psychosocial Evaluation and Intervention: Psychosocial Evaluation - 02/07/18 1658      Psychosocial Evaluation & Interventions   Interventions  Stress management education;Encouraged to exercise  with the program and follow exercise prescription    Comments  Counselor met with Mr. Jose [name]) today as he returned to this program following several months of attending to other health issues such as the seroma on his left leg.  He is a 62 year old who had a CABGx3 in June of this year.  He has  a strong support system with a spouse of 3 years; (4) adult children and (3) step children.  Jose [name] has multiple health issues with COPD; sleep apnea and the seroma on his leg.  He reports not sleeping very well with 4-6 hours per night, but now has a BPAP vs a CPAP and is on oxygen at night only.  He has a good appetite.  Jose [name] reports some undiagnosed depression and states his mood is generally negative most of the time.  His PHQ-9 Score is an "8" indicating mild symptoms of depression are present and his Quality of Life scores are extremely low at Global scores of 9.94.  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  He has goals to "survive" this program; to increase his energy and to hopefully quit smoking.  Counselor discussed the value of exercise for stress management - his mood and sleep and to speak with his Dr. about help for smoking cessation.  Staff will follow with him.    Expected Outcomes  Short:  Jose [name] with speak with his Dr. about help with smoking cessation.  He will also exercise for his health and mental health to cope more positively with stress.  Long:  Jose [name] will develop a routine of exercise and healthy lifestyle habits for his health and mental health.    Continue Psychosocial Services   Follow up required by staff       Psychosocial Re-Evaluation:   Psychosocial Discharge (Final Psychosocial Re-Evaluation):   Vocational Rehabilitation: Provide vocational rehab assistance to qualifying candidates.   Vocational Rehab Evaluation &  Intervention: Vocational Rehab - 02/03/18 1440      Initial Vocational Rehab Evaluation & Intervention   Assessment shows need for Vocational Rehabilitation  No       Education: Education Goals: Education classes will be provided on a variety of topics geared toward better understanding of heart health and risk factor modification. Participant will state understanding/return demonstration of topics presented as noted by education test scores.  Learning Barriers/Preferences: Learning Barriers/Preferences - 02/03/18 1439      Learning Barriers/Preferences   Learning Barriers  None    Learning Preferences  Audio;Skilled Demonstration;Verbal Instruction;Video;Individual Instruction       Education Topics:  AED/CPR: - Group verbal and written instruction with the use of models to demonstrate the basic use of the AED with the basic ABC's of resuscitation.   General Nutrition Guidelines/Fats and Fiber: -Group instruction provided by verbal, written material, models and posters to present the general guidelines for heart healthy nutrition. Gives an explanation and review of dietary fats and fiber.   Controlling Sodium/Reading Food Labels: -Group verbal and written material supporting the discussion of sodium use in heart healthy nutrition. Review and explanation with models, verbal and written materials for utilization of the food label.   Exercise Physiology & General Exercise Guidelines: - Group verbal and written instruction with models to review the exercise physiology of the cardiovascular system and associated critical values. Provides general exercise guidelines with specific guidelines to those with heart or lung disease.    Cardiac Rehab from 02/14/2018 in [facility] Cardiac and Pulmonary Rehab  Date  10/13/17  Educator  AS  Instruction Review Code  1- Verbalizes Understanding      Aerobic Exercise & Resistance Training: - Gives group verbal and written instruction on the  various components  of exercise. Focuses on aerobic and resistive training programs and the benefits of this training and how to safely progress through these programs..   Cardiac Rehab from 02/14/2018 in [facility] Cardiac and Pulmonary Rehab  Date  10/18/17  Educator  [facility]  Instruction Review Code  1- Geologist, engineering, Balance, Mind/Body Relaxation: Provides group verbal/written instruction on the benefits of flexibility and balance training, including mind/body exercise modes such as yoga, pilates and tai chi.  Demonstration and skill practice provided.   Cardiac Rehab from 02/14/2018 in [facility] Cardiac and Pulmonary Rehab  Date  02/07/18  Educator  AS  Instruction Review Code  1- Verbalizes Understanding      Stress and Anxiety: - Provides group verbal and written instruction about the health risks of elevated stress and causes of high stress.  Discuss the correlation between heart/lung disease and anxiety and treatment options. Review healthy ways to manage with stress and anxiety.   Depression: - Provides group verbal and written instruction on the correlation between heart/lung disease and depressed mood, treatment options, and the stigmas associated with seeking treatment.   Anatomy & Physiology of the Heart: - Group verbal and written instruction and models provide basic cardiac anatomy and physiology, with the coronary electrical and arterial systems. Review of Valvular disease and Heart Failure   Cardiac Procedures: - Group verbal and written instruction to review commonly prescribed medications for heart disease. Reviews the medication, class of the drug, and side effects. Includes the steps to properly store meds and maintain the prescription regimen. (beta blockers and nitrates)   Cardiac Medications I: - Group verbal and written instruction to review commonly prescribed medications for heart disease. Reviews the medication, class of the drug, and side  effects. Includes the steps to properly store meds and maintain the prescription regimen.   Cardiac Medications II: -Group verbal and written instruction to review commonly prescribed medications for heart disease. Reviews the medication, class of the drug, and side effects. (all other drug classes)   Cardiac Rehab from 02/14/2018 in [facility] Cardiac and Pulmonary Rehab  Date  02/14/18  Educator  CE  Instruction Review Code  1- Verbalizes Understanding       Go Sex-Intimacy & Heart Disease, Get SMART - Goal Setting: - Group verbal and written instruction through game format to discuss heart disease and the return to sexual intimacy. Provides group verbal and written material to discuss and apply goal setting through the application of the S.M.A.R.T. Method.   Other Matters of the Heart: - Provides group verbal, written materials and models to describe Stable Angina and Peripheral Artery. Includes description of the disease process and treatment options available to the cardiac patient.   Exercise & Equipment Safety: - Individual verbal instruction and demonstration of equipment use and safety with use of the equipment.   Cardiac Rehab from 02/14/2018 in [facility] Cardiac and Pulmonary Rehab  Date  10/06/17  Educator  Nada Maclachlan  Instruction Review Code  1- Verbalizes Understanding      Infection Prevention: - Provides verbal and written material to individual with discussion of infection control including proper hand washing and proper equipment cleaning during exercise session.   Cardiac Rehab from 02/14/2018 in [facility] Cardiac and Pulmonary Rehab  Date  10/04/17  Educator  [facility]  Instruction Review Code  1- Verbalizes Understanding      Falls Prevention: - Provides verbal and written material to individual with discussion of falls prevention and safety.   Cardiac  Rehab from 02/14/2018 in [facility] Cardiac and Pulmonary Rehab  Date  10/04/17  Educator  [facility]  Instruction Review Code  1-  Verbalizes Understanding      Diabetes: - Individual verbal and written instruction to review signs/symptoms of diabetes, desired ranges of glucose level fasting, after meals and with exercise. Acknowledge that pre and post exercise glucose checks will be done for 3 sessions at entry of program.   Cardiac Rehab from 02/14/2018 in [facility] Cardiac and Pulmonary Rehab  Date  10/04/17  Educator  [facility]  Instruction Review Code  1- Verbalizes Understanding      Know Your Numbers and Risk Factors: -Group verbal and written instruction about important numbers in your health.  Discussion of what are risk factors and how they play a role in the disease process.  Review of Cholesterol, Blood Pressure, Diabetes, and BMI and the role they play in your overall health.   Cardiac Rehab from 02/14/2018 in [facility] Cardiac and Pulmonary Rehab  Date  02/14/18  Educator  CE  Instruction Review Code  1- Verbalizes Understanding      Sleep Hygiene: -Provides group verbal and written instruction about how sleep can affect your health.  Define sleep hygiene, discuss sleep cycles and impact of sleep habits. Review good sleep hygiene tips.    Cardiac Rehab from 02/14/2018 in [facility] Cardiac and Pulmonary Rehab  Date  10/06/17  Educator  Lucianne Lei, MSW  Instruction Review Code  1- Verbalizes Understanding      Other: -Provides group and verbal instruction on various topics (see comments)   Knowledge Questionnaire Score: Knowledge Questionnaire Score - 02/03/18 1439      Knowledge Questionnaire Score   Pre Score  24/26   Correct responses were reviewed with Jose [name] today. He verbalized understanding and had no furhter questions today.      Core Components/Risk Factors/Patient Goals at Admission: Personal Goals and Risk Factors at Admission - 02/03/18 1440      Core Components/Risk Factors/Patient Goals on Admission    Weight Management  Yes;Obesity;Weight Loss    Intervention  Weight Management: Develop a  combined nutrition and exercise program designed to reach desired caloric intake, while maintaining appropriate intake of nutrient and fiber, sodium and fats, and appropriate energy expenditure required for the weight goal.;Weight Management: Provide education and appropriate resources to help participant work on and attain dietary goals.;Weight Management/Obesity: Establish reasonable short term and long term weight goals.;Obesity: Provide education and appropriate resources to help participant work on and attain dietary goals.    Admit Weight  239 lb 14.4 oz (108.8 kg)    Goal Weight: Short Term  237 lb (107.5 kg)    Goal Weight: Long Term  200 lb (90.7 kg)    Expected Outcomes  Short Term: Continue to assess and modify interventions until short term weight is achieved;Long Term: Adherence to nutrition and physical activity/exercise program aimed toward attainment of established weight goal;Weight Loss: Understanding of general recommendations for a balanced deficit meal plan, which promotes 1-2 lb weight loss per week and includes a negative energy balance of [phone] kcal/d    Tobacco Cessation  Yes    Number of packs per day  0.5    Intervention  Assist the participant in steps to quit. Provide individualized education and counseling about committing to Tobacco Cessation, relapse prevention, and pharmacological support that can be provided by physician.;Advice worker, assist with locating and accessing local/national Quit Smoking programs, and support quit date choice.  Expected Outcomes  Short Term: Will demonstrate readiness to quit, by selecting a quit date.;Short Term: Will quit all tobacco product use, adhering to prevention of relapse plan.;Long Term: Complete abstinence from all tobacco products for at least 12 months from quit date.    Diabetes  Yes    Intervention  Provide education about signs/symptoms and action to take for hypo/hyperglycemia.;Provide education about  proper nutrition, including hydration, and aerobic/resistive exercise prescription along with prescribed medications to achieve blood glucose in normal ranges: Fasting glucose 65-99 mg/dL    Expected Outcomes  Short Term: Participant verbalizes understanding of the signs/symptoms and immediate care of hyper/hypoglycemia, proper foot care and importance of medication, aerobic/resistive exercise and nutrition plan for blood glucose control.;Long Term: Attainment of HbA1C < 7%.    Hypertension  Yes    Intervention  Provide education on lifestyle modifcations including regular physical activity/exercise, weight management, moderate sodium restriction and increased consumption of fresh fruit, vegetables, and low fat dairy, alcohol moderation, and smoking cessation.;Monitor prescription use compliance.    Expected Outcomes  Short Term: Continued assessment and intervention until BP is < 140/19m HG in hypertensive participants. < 130/866mHG in hypertensive participants with diabetes, heart failure or chronic kidney disease.;Long Term: Maintenance of blood pressure at goal levels.    Lipids  Yes    Intervention  Provide education and support for participant on nutrition & aerobic/resistive exercise along with prescribed medications to achieve LDL '70mg'$ , HDL >'40mg'$ .    Expected Outcomes  Short Term: Participant states understanding of desired cholesterol values and is compliant with medications prescribed. Participant is following exercise prescription and nutrition guidelines.;Long Term: Cholesterol controlled with medications as prescribed, with individualized exercise RX and with personalized nutrition plan. Value goals: LDL < '70mg'$ , HDL > 40 mg.       Core Components/Risk Factors/Patient Goals Review:    Core Components/Risk Factors/Patient Goals at Discharge (Final Review):    ITP Comments: ITP Comments    Row Name 02/03/18 1432 02/16/18 0624         ITP Comments  Readmit after time out for  medical reasons. Itp sent to Dr M Loleta Chanceor review,changes as needed and signature. Documnetation of diagnosis can be found in 01/10/2018 Office Visit.  30 day review. Continue with ITP unless direccted changes per Medical Director Chart Review. New to program         Comments:

## 2018-03-04 ENCOUNTER — Telehealth: Payer: Self-pay

## 2018-03-04 NOTE — Telephone Encounter (Signed)
LMOM

## 2018-03-15 ENCOUNTER — Encounter: Payer: Self-pay | Admitting: *Deleted

## 2018-03-15 DIAGNOSIS — Z951 Presence of aortocoronary bypass graft: Secondary | ICD-10-CM

## 2018-03-15 NOTE — Progress Notes (Signed)
Cardiac Individual Treatment Plan  Patient Details  Name: Jose [name] MRN: [id number] Date of Birth: 10/29/1955 Referring Provider:     Cardiac Rehab from 02/03/2018 in [facility]iac [facility] Pulmonary Rehab  Referring Provider  Carmin Richmond MD      Initial Encounter Date:    Cardiac Rehab from 02/03/2018 in [facility] Cardiac and Pulmonary Rehab  Date  02/03/18      Visit Diagnosis: S/P CABG x 3  Patient's Home Medications on Admission:  Current Outpatient Medications:  .  ALPRAZolam (XANAX) 0.5 MG tablet, TAKE ONE TABLET (0.5 MG TOTAL) BY MOUTH 3 (THREE) TIMES A DAY AS NEEDED FOR SLEEP OR ANXIETY., Disp: , Rfl:  .  aspirin EC 81 MG tablet, Take 81 mg by mouth daily., Disp: , Rfl:  .  Blood Glucose Monitoring Suppl (GLUCOCOM BLOOD GLUCOSE MONITOR) DEVI, 1 each by XX route as directed (For Blood Glucose Monitoring), Disp: , Rfl:  .  colchicine 0.6 MG tablet, Take 1 tablet (0.6 mg total) by mouth 2 (two) times daily. (Patient not taking: Reported on 10/04/2017), Disp: 60 tablet, Rfl: 2 .  furosemide (LASIX) 40 MG tablet, Take 1 tablet (40 mg total) by mouth daily. (Patient not taking: Reported on 10/04/2017), Disp: 30 tablet, Rfl: 2 .  glucose blood (PRECISION QID TEST) test strip, Use 1 each as directed (For Blood Glucose Monitoring 4 times daily) Use as instructed., Disp: , Rfl:  .  isosorbide mononitrate (IMDUR) 60 MG 24 hr tablet, Take 1 tablet (60 mg total) by mouth daily. (Patient not taking: Reported on 10/04/2017), Disp: 30 tablet, Rfl: 1 .  metoprolol succinate (TOPROL-XL) 100 MG 24 hr tablet, Take 100 mg by mouth daily., Disp: , Rfl: 3 .  metoprolol tartrate (LOPRESSOR) 12.5 mg TABS tablet, Take by mouth., Disp: , Rfl:  .  nitroGLYCERIN (NITROSTAT) 0.4 MG SL tablet, Place 0.4 mg under the tongue every 5 (five) minutes as needed for chest pain. , Disp: , Rfl: 1 .  pantoprazole (PROTONIX) 40 MG tablet, Take 40 mg by mouth 2 (two) times daily., Disp: , Rfl: 3 .  polyethylene glycol  (MIRALAX / GLYCOLAX) packet, Take 17 g by mouth daily. (Patient not taking: Reported on 10/04/2017), Disp: 14 each, Rfl: 0 .  potassium chloride (MICRO-K) 10 MEQ CR capsule, Take by mouth., Disp: , Rfl:  .  PROAIR HFA 108 (90 Base) MCG/ACT inhaler, Inhale 1-2 puffs into the lungs every 4 (four) hours as needed for wheezing. , Disp: , Rfl: 12 .  rosuvastatin (CRESTOR) 10 MG tablet, Take 10 mg by mouth every evening., Disp: , Rfl: 11 .  tamsulosin (FLOMAX) 0.4 MG CAPS capsule, Take 0.4 mg by mouth daily., Disp: , Rfl: 0 .  Tiotropium Bromide-Olodaterol (STIOLTO RESPIMAT) 2.5-2.5 MCG/ACT AERS, Inhale into the lungs., Disp: , Rfl:  .  topiramate (TOPAMAX) 50 MG tablet, Take 50 mg by mouth 2 (two) times daily., Disp: , Rfl: 0 .  torsemide (DEMADEX) 20 MG tablet, Take by mouth., Disp: , Rfl:  .  warfarin (COUMADIN) 5 MG tablet, Take 5-7.5 mg by mouth See admin instructions. Take 7.5 mg on Monday, Wednesday and Friday then take 5 mg all the other days, Disp: , Rfl: 0  Past Medical History: Past Medical History:  Diagnosis Date  . Antiphospholipid antibody syndrome ([place])   . CAD S/P percutaneous coronary angioplasty '98, '04, Feb 2017   [facility]  . Carotid arterial disease ([place]) 10/2015   moderate bilateral  . COPD (chronic obstructive pulmonary disease) (  [place])   . Coronary artery disease   . Hypertension   . Non-insulin treated type 2 diabetes mellitus ([place])   . Pulmonary emboli ([place]) 2011  . RBBB   . Sleep apnea     Tobacco Use: Social History   Tobacco Use  Smoking Status Former Smoker  . Packs/day: 0.50  . Types: Cigarettes  Smokeless Tobacco Never Used  Tobacco Comment   has started smoking again, willing to quit.    Labs: Recent Review Flowsheet Data    Labs for ITP Cardiac and Pulmonary Rehab Latest Ref Rng & Units 08/16/2016   Cholestrol 0 - 200 mg/dL 156   LDLCALC 0 - 99 mg/dL 64   HDL >40 mg/dL 28(L)   Trlycerides <150 mg/dL 322(H)   Hemoglobin A1c 4.8 - 5.6 % 5.9(H)        Exercise Target Goals: Exercise Program Goal: Individual exercise prescription set using results from initial 6 min walk test and THRR while considering  patient's activity barriers and safety.   Exercise Prescription Goal: Initial exercise prescription builds to 30-45 minutes a day of aerobic activity, 2-3 days per week.  Home exercise guidelines will be given to patient during program as part of exercise prescription that the participant will acknowledge.  Activity Barriers & Risk Stratification: Activity Barriers & Cardiac Risk Stratification - 02/03/18 1443      Activity Barriers & Cardiac Risk Stratification   Activity Barriers  Back Problems;Shortness of Breath;Other (comment);Deconditioning;Muscular Weakness;Balance Concerns;Decreased Ventricular Function    Comments  Serotoma on leg, history of laminectomies/back spams,SOB, vertigo    Cardiac Risk Stratification  High       6 Minute Walk: 6 Minute Walk    Row Name 02/03/18 1456         6 Minute Walk   Phase  Initial     Distance  1002 feet     Walk Time  6 minutes     # of Rest Breaks  0     MPH  1.89     METS  2.49     RPE  15     Perceived Dyspnea   3     VO2 Peak  8.74     Symptoms  Yes (comment)     Comments  back spasm/discomfort 8/10  cramps/claudication thigh 4/5     Resting HR  86 bpm     Resting BP  124/74     Resting Oxygen Saturation   98 %     Exercise Oxygen Saturation  during 6 min walk  95 %     Max Ex. HR  118 bpm     Max Ex. BP  142/72     2 Minute Post BP  134/70        Oxygen Initial Assessment:   Oxygen Re-Evaluation:   Oxygen Discharge (Final Oxygen Re-Evaluation):   Initial Exercise Prescription: Initial Exercise Prescription - 02/03/18 1400      Date of Initial Exercise RX and Referring Provider   Date  02/03/18    Referring Provider  Carmin Richmond MD      Treadmill   MPH  1.8    Grade  0.5    Minutes  15    METs  2.5      Recumbant Bike   Level  1     RPM  50    Watts  17    Minutes  15    METs  2.5  T5 Nustep   Level  1    SPM  80    Minutes  15    METs  2.5      Prescription Details   Frequency (times per week)  3    Duration  Progress to 45 minutes of aerobic exercise without signs/symptoms of physical distress      Intensity   THRR 40-80% of Max Heartrate  115-144    Ratings of Perceived Exertion  11-13    Perceived Dyspnea  0-4      Progression   Progression  Continue to progress workloads to maintain intensity without signs/symptoms of physical distress.      Resistance Training   Training Prescription  Yes    Weight  3 lbs    Reps  10-15       Perform Capillary Blood Glucose checks as needed.  Exercise Prescription Changes: Exercise Prescription Changes    Row Name 02/03/18 1400 02/08/18 0800           Response to Exercise   Blood Pressure (Admit)  124/74  124/70      Blood Pressure (Exercise)  142/72  142/74      Blood Pressure (Exit)  134/70  102/70      Heart Rate (Admit)  86 bpm  60 bpm      Heart Rate (Exercise)  118 bpm  111 bpm      Heart Rate (Exit)  80 bpm  90 bpm      Oxygen Saturation (Admit)  98 %  -      Oxygen Saturation (Exercise)  95 %  -      Rating of Perceived Exertion (Exercise)  15  12      Symptoms  back spasm/discomfort 8/10, cramps/claudication in thighs 4/5  none      Comments  walk test results  first day exercise      Duration  -  Progress to 45 minutes of aerobic exercise without signs/symptoms of physical distress      Intensity  -  THRR unchanged        Progression   Progression  -  Continue to progress workloads to maintain intensity without signs/symptoms of physical distress.      Average METs  -  2.5        Resistance Training   Training Prescription  -  Yes      Weight  -  3 lbs      Reps  -  10-15        Interval Training   Interval Training  -  No        Recumbant Bike   Level  -  1      RPM  -  60      Watts  -  17      Minutes  -  15      METs   -  2.49         Exercise Comments: Exercise Comments    Row Name 02/07/18 1741           Exercise Comments   First full day of exercise!  Patient was oriented to gym and equipment including functions, settings, policies, and procedures.  Patient's individual exercise prescription and treatment plan were reviewed.  All starting workloads were established based on the results of the 6 minute walk test done at initial orientation visit.  The plan for exercise progression was also introduced and progression will be  customized based on patient's performance and goals.          Exercise Goals and Review: Exercise Goals    Row Name 02/03/18 1459             Exercise Goals   Increase Physical Activity  Yes       Intervention  Provide advice, education, support and counseling about physical activity/exercise needs.;Develop an individualized exercise prescription for aerobic and resistive training based on initial evaluation findings, risk stratification, comorbidities and participant's personal goals.       Expected Outcomes  Short Term: Attend rehab on a regular basis to increase amount of physical activity.;Long Term: Add in home exercise to make exercise part of routine and to increase amount of physical activity.;Long Term: Exercising regularly at least 3-5 days a week.       Increase Strength and Stamina  Yes       Intervention  Provide advice, education, support and counseling about physical activity/exercise needs.;Develop an individualized exercise prescription for aerobic and resistive training based on initial evaluation findings, risk stratification, comorbidities and participant's personal goals.       Expected Outcomes  Short Term: Increase workloads from initial exercise prescription for resistance, speed, and METs.;Short Term: Perform resistance training exercises routinely during rehab and add in resistance training at home;Long Term: Improve cardiorespiratory fitness, muscular  endurance and strength as measured by increased METs and functional capacity (6MWT)       Able to understand and use rate of perceived exertion (RPE) scale  Yes       Intervention  Provide education and explanation on how to use RPE scale       Expected Outcomes  Short Term: Able to use RPE daily in rehab to express subjective intensity level;Long Term:  Able to use RPE to guide intensity level when exercising independently       Able to understand and use Dyspnea scale  Yes       Intervention  Provide education and explanation on how to use Dyspnea scale       Expected Outcomes  Short Term: Able to use Dyspnea scale daily in rehab to express subjective sense of shortness of breath during exertion;Long Term: Able to use Dyspnea scale to guide intensity level when exercising independently       Knowledge and understanding of Target Heart Rate Range (THRR)  Yes       Intervention  Provide education and explanation of THRR including how the numbers were predicted and where they are located for reference       Expected Outcomes  Short Term: Able to state/look up THRR;Short Term: Able to use daily as guideline for intensity in rehab;Long Term: Able to use THRR to govern intensity when exercising independently       Able to check pulse independently  Yes       Intervention  Provide education and demonstration on how to check pulse in carotid and radial arteries.;Review the importance of being able to check your own pulse for safety during independent exercise       Expected Outcomes  Short Term: Able to explain why pulse checking is important during independent exercise;Long Term: Able to check pulse independently and accurately       Understanding of Exercise Prescription  Yes       Intervention  Provide education, explanation, and written materials on patient's individual exercise prescription       Expected Outcomes  Short Term: Able to explain  program exercise prescription;Long Term: Able to explain  home exercise prescription to exercise independently       Improve claudication pain toleration; Improve walking ability  Yes       Intervention  Attend education sessions to aid in risk factor modification and understanding of disease process       Expected Outcomes  Long Term: Improve walking ability and toleration to claudication          Exercise Goals Re-Evaluation : Exercise Goals Re-Evaluation    Row Name 02/07/18 1741             Exercise Goal Re-Evaluation   Exercise Goals Review  Increase Physical Activity;Able to understand and use rate of perceived exertion (RPE) scale;Knowledge and understanding of Target Heart Rate Range (THRR);Understanding of Exercise Prescription;Increase Strength and Stamina       Comments  Reviewed RPE scale, THR and program prescription with pt today.  Pt voiced understanding and was given a copy of goals to take home.        Expected Outcomes  Short: Use RPE daily to regulate intensity. Long: Follow program prescription in THR.          Discharge Exercise Prescription (Final Exercise Prescription Changes): Exercise Prescription Changes - 02/08/18 0800      Response to Exercise   Blood Pressure (Admit)  124/70    Blood Pressure (Exercise)  142/74    Blood Pressure (Exit)  102/70    Heart Rate (Admit)  60 bpm    Heart Rate (Exercise)  111 bpm    Heart Rate (Exit)  90 bpm    Rating of Perceived Exertion (Exercise)  12    Symptoms  none    Comments  first day exercise    Duration  Progress to 45 minutes of aerobic exercise without signs/symptoms of physical distress    Intensity  THRR unchanged      Progression   Progression  Continue to progress workloads to maintain intensity without signs/symptoms of physical distress.    Average METs  2.5      Resistance Training   Training Prescription  Yes    Weight  3 lbs    Reps  10-15      Interval Training   Interval Training  No      Recumbant Bike   Level  1    RPM  60    Watts  17     Minutes  15    METs  2.49       Nutrition:  Target Goals: Understanding of nutrition guidelines, daily intake of sodium '1500mg'$ , cholesterol '200mg'$ , calories 30% from fat and 7% or less from saturated fats, daily to have 5 or more servings of fruits and vegetables.  Biometrics: Pre Biometrics - 02/03/18 1500      Pre Biometrics   Height  5' 6.9" (1.699 m)    Weight  239 lb 14.4 oz (108.8 kg)    Waist Circumference  45 inches    Hip Circumference  48 inches    Waist to Hip Ratio  0.94 %    BMI (Calculated)  37.7    Single Leg Stand  4.27 seconds        Nutrition Therapy Plan and Nutrition Goals:   Nutrition Assessments: Nutrition Assessments - 02/03/18 1439      MEDFICTS Scores   Pre Score  111       Nutrition Goals Re-Evaluation:   Nutrition Goals Discharge (Final Nutrition Goals Re-Evaluation):  Psychosocial: Target Goals: Acknowledge presence or absence of significant depression and/or stress, maximize coping skills, provide positive support system. Participant is able to verbalize types and ability to use techniques and skills needed for reducing stress and depression.   Initial Review & Psychosocial Screening: Initial Psych Review & Screening - 02/03/18 1434      Initial Review   Current issues with  None Identified      Barriers   Psychosocial barriers to participate in program  There are no identifiable barriers or psychosocial needs.;The patient should benefit from training in stress management and relaxation.      Screening Interventions   Interventions  Encouraged to exercise;To provide support and resources with identified psychosocial needs;Provide feedback about the scores to participant    Expected Outcomes  Short Term goal: Utilizing psychosocial counselor, staff and physician to assist with identification of specific Stressors or current issues interfering with healing process. Setting desired goal for each stressor or current issue  identified.;Long Term Goal: Stressors or current issues are controlled or eliminated.;Short Term goal: Identification and review with participant of any Quality of Life or Depression concerns found by scoring the questionnaire.;Long Term goal: The participant improves quality of Life and PHQ9 Scores as seen by post scores and/or verbalization of changes       Quality of Life Scores:  Quality of Life - 02/03/18 1435      Quality of Life   Select  Quality of Life      Quality of Life Scores   Health/Function Pre  7.87 %    Socioeconomic Pre  14.36 %    Psych/Spiritual Pre  8.5 %    Family Pre  12 %    GLOBAL Pre  9.94 %      Scores of 19 and below usually indicate a poorer quality of life in these areas.  A difference of  2-3 points is a clinically meaningful difference.  A difference of 2-3 points in the total score of the Quality of Life Index has been associated with significant improvement in overall quality of life, self-image, physical symptoms, and general health in studies assessing change in quality of life.  PHQ-9: Recent Review Flowsheet Data    Depression screen [facility] 2/9 02/03/2018 10/04/2017   Decreased Interest 1 1   Down, Depressed, Hopeless 1 1   PHQ - 2 Score 2 2   Altered sleeping 1 2   Tired, decreased energy 2 3   Change in appetite 2 1   Feeling bad or failure about yourself  1 1   Trouble concentrating 0 0   Moving slowly or fidgety/restless 0 0   Suicidal thoughts 0 0   PHQ-9 Score 8 9   Difficult doing work/chores Somewhat difficult Somewhat difficult     Interpretation of Total Score  Total Score Depression Severity:  1-4 = Minimal depression, 5-9 = Mild depression, 10-14 = Moderate depression, 15-19 = Moderately severe depression, 20-27 = Severe depression   Psychosocial Evaluation and Intervention: Psychosocial Evaluation - 02/07/18 1658      Psychosocial Evaluation & Interventions   Interventions  Stress management education;Encouraged to exercise  with the program and follow exercise prescription    Comments  Counselor met with Mr. Markoff Merry Proud) today as he returned to this program following several months of attending to other health issues such as the seroma on his left leg.  He is a 62 year old who had a CABGx3 in June of this year.  He has  a strong support system with a spouse of 3 years; (4) adult children and (3) step children.  Merry Proud has multiple health issues with COPD; sleep apnea and the seroma on his leg.  He reports not sleeping very well with 4-6 hours per night, but now has a BPAP vs a CPAP and is on oxygen at night only.  He has a good appetite.  Merry Proud reports some undiagnosed depression and states his mood is generally negative most of the time.  His PHQ-9 Score is an "8" indicating mild symptoms of depression are present and his Quality of Life scores are extremely low at Global scores of 9.94.  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  He has goals to "survive" this program; to increase his energy and to hopefully quit smoking.  Counselor discussed the value of exercise for stress management - his mood and sleep and to speak with his Dr. about help for smoking cessation.  Staff will follow with him.    Expected Outcomes  Short:  Merry Proud with speak with his Dr. about help with smoking cessation.  He will also exercise for his health and mental health to cope more positively with stress.  Long:  Merry Proud will develop a routine of exercise and healthy lifestyle habits for his health and mental health.    Continue Psychosocial Services   Follow up required by staff       Psychosocial Re-Evaluation:   Psychosocial Discharge (Final Psychosocial Re-Evaluation):   Vocational Rehabilitation: Provide vocational rehab assistance to qualifying candidates.   Vocational Rehab Evaluation &  Intervention: Vocational Rehab - 02/03/18 1440      Initial Vocational Rehab Evaluation & Intervention   Assessment shows need for Vocational Rehabilitation  No       Education: Education Goals: Education classes will be provided on a variety of topics geared toward better understanding of heart health and risk factor modification. Participant will state understanding/return demonstration of topics presented as noted by education test scores.  Learning Barriers/Preferences: Learning Barriers/Preferences - 02/03/18 1439      Learning Barriers/Preferences   Learning Barriers  None    Learning Preferences  Audio;Skilled Demonstration;Verbal Instruction;Video;Individual Instruction       Education Topics:  AED/CPR: - Group verbal and written instruction with the use of models to demonstrate the basic use of the AED with the basic ABC's of resuscitation.   General Nutrition Guidelines/Fats and Fiber: -Group instruction provided by verbal, written material, models and posters to present the general guidelines for heart healthy nutrition. Gives an explanation and review of dietary fats and fiber.   Controlling Sodium/Reading Food Labels: -Group verbal and written material supporting the discussion of sodium use in heart healthy nutrition. Review and explanation with models, verbal and written materials for utilization of the food label.   Exercise Physiology & General Exercise Guidelines: - Group verbal and written instruction with models to review the exercise physiology of the cardiovascular system and associated critical values. Provides general exercise guidelines with specific guidelines to those with heart or lung disease.    Cardiac Rehab from 02/16/2018 in [facility] Cardiac and Pulmonary Rehab  Date  10/13/17  Educator  AS  Instruction Review Code  1- Verbalizes Understanding      Aerobic Exercise & Resistance Training: - Gives group verbal and written instruction on the  various components  of exercise. Focuses on aerobic and resistive training programs and the benefits of this training and how to safely progress through these programs..   Cardiac Rehab from 02/16/2018 in [facility] Cardiac and Pulmonary Rehab  Date  10/18/17  Educator  [facility]  Instruction Review Code  1- Geologist, engineering, Balance, Mind/Body Relaxation: Provides group verbal/written instruction on the benefits of flexibility and balance training, including mind/body exercise modes such as yoga, pilates and tai chi.  Demonstration and skill practice provided.   Cardiac Rehab from 02/16/2018 in [facility] Cardiac and Pulmonary Rehab  Date  02/07/18  Educator  AS  Instruction Review Code  1- Verbalizes Understanding      Stress and Anxiety: - Provides group verbal and written instruction about the health risks of elevated stress and causes of high stress.  Discuss the correlation between heart/lung disease and anxiety and treatment options. Review healthy ways to manage with stress and anxiety.   Depression: - Provides group verbal and written instruction on the correlation between heart/lung disease and depressed mood, treatment options, and the stigmas associated with seeking treatment.   Anatomy & Physiology of the Heart: - Group verbal and written instruction and models provide basic cardiac anatomy and physiology, with the coronary electrical and arterial systems. Review of Valvular disease and Heart Failure   Cardiac Procedures: - Group verbal and written instruction to review commonly prescribed medications for heart disease. Reviews the medication, class of the drug, and side effects. Includes the steps to properly store meds and maintain the prescription regimen. (beta blockers and nitrates)   Cardiac Rehab from 02/16/2018 in [facility] Cardiac and Pulmonary Rehab  Date  02/16/18  Educator  [facility]  Instruction Review Code  1- Verbalizes Understanding      Cardiac Medications  I: - Group verbal and written instruction to review commonly prescribed medications for heart disease. Reviews the medication, class of the drug, and side effects. Includes the steps to properly store meds and maintain the prescription regimen.   Cardiac Medications II: -Group verbal and written instruction to review commonly prescribed medications for heart disease. Reviews the medication, class of the drug, and side effects. (all other drug classes)   Cardiac Rehab from 02/16/2018 in [facility] Cardiac and Pulmonary Rehab  Date  02/14/18  Educator  CE  Instruction Review Code  1- Verbalizes Understanding       Go Sex-Intimacy & Heart Disease, Get SMART - Goal Setting: - Group verbal and written instruction through game format to discuss heart disease and the return to sexual intimacy. Provides group verbal and written material to discuss and apply goal setting through the application of the S.M.A.R.T. Method.   Cardiac Rehab from 02/16/2018 in [facility] Cardiac and Pulmonary Rehab  Date  02/16/18  Educator  [facility]  Instruction Review Code  1- Verbalizes Understanding      Other Matters of the Heart: - Provides group verbal, written materials and models to describe Stable Angina and Peripheral Artery. Includes description of the disease process and treatment options available to the cardiac patient.   Exercise & Equipment Safety: - Individual verbal instruction and demonstration of equipment use and safety with use of the equipment.   Cardiac Rehab from 02/16/2018 in [facility] Cardiac and Pulmonary Rehab  Date  10/06/17  Educator  Nada Maclachlan  Instruction Review Code  1- Verbalizes Understanding      Infection Prevention: - Provides verbal and written material to individual with discussion of infection control including proper  hand washing and proper equipment cleaning during exercise session.   Cardiac Rehab from 02/16/2018 in [facility] Cardiac and Pulmonary Rehab  Date  10/04/17  Educator  [facility]   Instruction Review Code  1- Verbalizes Understanding      Falls Prevention: - Provides verbal and written material to individual with discussion of falls prevention and safety.   Cardiac Rehab from 02/16/2018 in [facility] Cardiac and Pulmonary Rehab  Date  10/04/17  Educator  [facility]  Instruction Review Code  1- Verbalizes Understanding      Diabetes: - Individual verbal and written instruction to review signs/symptoms of diabetes, desired ranges of glucose level fasting, after meals and with exercise. Acknowledge that pre and post exercise glucose checks will be done for 3 sessions at entry of program.   Cardiac Rehab from 02/16/2018 in [facility] Cardiac and Pulmonary Rehab  Date  10/04/17  Educator  [facility]  Instruction Review Code  1- Verbalizes Understanding      Know Your Numbers and Risk Factors: -Group verbal and written instruction about important numbers in your health.  Discussion of what are risk factors and how they play a role in the disease process.  Review of Cholesterol, Blood Pressure, Diabetes, and BMI and the role they play in your overall health.   Cardiac Rehab from 02/16/2018 in [facility] Cardiac and Pulmonary Rehab  Date  02/14/18  Educator  CE  Instruction Review Code  1- Verbalizes Understanding      Sleep Hygiene: -Provides group verbal and written instruction about how sleep can affect your health.  Define sleep hygiene, discuss sleep cycles and impact of sleep habits. Review good sleep hygiene tips.    Cardiac Rehab from 02/16/2018 in [facility] Cardiac and Pulmonary Rehab  Date  10/06/17  Educator  Lucianne Lei, MSW  Instruction Review Code  1- Verbalizes Understanding      Other: -Provides group and verbal instruction on various topics (see comments)   Knowledge Questionnaire Score: Knowledge Questionnaire Score - 02/03/18 1439      Knowledge Questionnaire Score   Pre Score  24/26   Correct responses were reviewed with Merry Proud today. He verbalized understanding and had no  furhter questions today.      Core Components/Risk Factors/Patient Goals at Admission: Personal Goals and Risk Factors at Admission - 02/03/18 1440      Core Components/Risk Factors/Patient Goals on Admission    Weight Management  Yes;Obesity;Weight Loss    Intervention  Weight Management: Develop a combined nutrition and exercise program designed to reach desired caloric intake, while maintaining appropriate intake of nutrient and fiber, sodium and fats, and appropriate energy expenditure required for the weight goal.;Weight Management: Provide education and appropriate resources to help participant work on and attain dietary goals.;Weight Management/Obesity: Establish reasonable short term and long term weight goals.;Obesity: Provide education and appropriate resources to help participant work on and attain dietary goals.    Admit Weight  239 lb 14.4 oz (108.8 kg)    Goal Weight: Short Term  237 lb (107.5 kg)    Goal Weight: Long Term  200 lb (90.7 kg)    Expected Outcomes  Short Term: Continue to assess and modify interventions until short term weight is achieved;Long Term: Adherence to nutrition and physical activity/exercise program aimed toward attainment of established weight goal;Weight Loss: Understanding of general recommendations for a balanced deficit meal plan, which promotes 1-2 lb weight loss per week and includes a negative energy balance of [phone] kcal/d    Tobacco Cessation  Yes  Number of packs per day  0.5    Intervention  Assist the participant in steps to quit. Provide individualized education and counseling about committing to Tobacco Cessation, relapse prevention, and pharmacological support that can be provided by physician.;Advice worker, assist with locating and accessing local/national Quit Smoking programs, and support quit date choice.    Expected Outcomes  Short Term: Will demonstrate readiness to quit, by selecting a quit date.;Short Term: Will  quit all tobacco product use, adhering to prevention of relapse plan.;Long Term: Complete abstinence from all tobacco products for at least 12 months from quit date.    Diabetes  Yes    Intervention  Provide education about signs/symptoms and action to take for hypo/hyperglycemia.;Provide education about proper nutrition, including hydration, and aerobic/resistive exercise prescription along with prescribed medications to achieve blood glucose in normal ranges: Fasting glucose 65-99 mg/dL    Expected Outcomes  Short Term: Participant verbalizes understanding of the signs/symptoms and immediate care of hyper/hypoglycemia, proper foot care and importance of medication, aerobic/resistive exercise and nutrition plan for blood glucose control.;Long Term: Attainment of HbA1C < 7%.    Hypertension  Yes    Intervention  Provide education on lifestyle modifcations including regular physical activity/exercise, weight management, moderate sodium restriction and increased consumption of fresh fruit, vegetables, and low fat dairy, alcohol moderation, and smoking cessation.;Monitor prescription use compliance.    Expected Outcomes  Short Term: Continued assessment and intervention until BP is < 140/74m HG in hypertensive participants. < 130/891mHG in hypertensive participants with diabetes, heart failure or chronic kidney disease.;Long Term: Maintenance of blood pressure at goal levels.    Lipids  Yes    Intervention  Provide education and support for participant on nutrition & aerobic/resistive exercise along with prescribed medications to achieve LDL '70mg'$ , HDL >'40mg'$ .    Expected Outcomes  Short Term: Participant states understanding of desired cholesterol values and is compliant with medications prescribed. Participant is following exercise prescription and nutrition guidelines.;Long Term: Cholesterol controlled with medications as prescribed, with individualized exercise RX and with personalized nutrition plan.  Value goals: LDL < '70mg'$ , HDL > 40 mg.       Core Components/Risk Factors/Patient Goals Review:    Core Components/Risk Factors/Patient Goals at Discharge (Final Review):    ITP Comments: ITP Comments    Row Name 02/03/18 1432 02/16/18 0624 03/11/18 0929 03/15/18 0657     ITP Comments  Readmit after time out for medical reasons. Itp sent to Dr M Loleta Chanceor review,changes as needed and signature. Documnetation of diagnosis can be found in 01/10/2018 Office Visit.  30 day review. Continue with ITP unless direccted changes per Medical Director Chart Review. New to program  JeMerry Proudas not attended since 02/16/18.  He has not returned phone calls.  30 Day Review. Continue with ITP unless directed changes per Medical Director review       Comments:

## 2018-03-17 ENCOUNTER — Encounter: Payer: BLUE CROSS/BLUE SHIELD | Attending: Family Medicine

## 2018-03-17 DIAGNOSIS — D6861 Antiphospholipid syndrome: Secondary | ICD-10-CM | POA: Insufficient documentation

## 2018-03-17 DIAGNOSIS — I2699 Other pulmonary embolism without acute cor pulmonale: Secondary | ICD-10-CM | POA: Insufficient documentation

## 2018-03-17 DIAGNOSIS — I10 Essential (primary) hypertension: Secondary | ICD-10-CM | POA: Insufficient documentation

## 2018-03-17 DIAGNOSIS — Z7982 Long term (current) use of aspirin: Secondary | ICD-10-CM | POA: Insufficient documentation

## 2018-03-17 DIAGNOSIS — E119 Type 2 diabetes mellitus without complications: Secondary | ICD-10-CM | POA: Insufficient documentation

## 2018-03-17 DIAGNOSIS — J449 Chronic obstructive pulmonary disease, unspecified: Secondary | ICD-10-CM | POA: Insufficient documentation

## 2018-03-17 DIAGNOSIS — Z79899 Other long term (current) drug therapy: Secondary | ICD-10-CM | POA: Insufficient documentation

## 2018-03-17 DIAGNOSIS — Z7901 Long term (current) use of anticoagulants: Secondary | ICD-10-CM | POA: Insufficient documentation

## 2018-03-17 DIAGNOSIS — Z7951 Long term (current) use of inhaled steroids: Secondary | ICD-10-CM | POA: Insufficient documentation

## 2018-03-17 DIAGNOSIS — Z87891 Personal history of nicotine dependence: Secondary | ICD-10-CM | POA: Insufficient documentation

## 2018-03-17 DIAGNOSIS — Z951 Presence of aortocoronary bypass graft: Secondary | ICD-10-CM | POA: Insufficient documentation

## 2018-03-17 DIAGNOSIS — Z79891 Long term (current) use of opiate analgesic: Secondary | ICD-10-CM | POA: Insufficient documentation

## 2018-03-23 ENCOUNTER — Telehealth: Payer: Self-pay

## 2018-03-23 NOTE — Telephone Encounter (Signed)
LMOM

## 2018-03-25 ENCOUNTER — Telehealth: Payer: Self-pay | Admitting: *Deleted

## 2018-03-25 NOTE — Telephone Encounter (Signed)
Left Message about attendance for [facility].

## 2018-04-01 NOTE — Progress Notes (Signed)
Cardiac Individual Treatment Plan  Patient Details  Name: Jose [name] MRN: [id number] Date of Birth: 03-19-1955 Referring Provider:     Cardiac Rehab from 02/03/2018 in [facility] Cardiac and Pulmonary Rehab  Referring Provider  Carmin Richmond MD      Initial Encounter Date:    Cardiac Rehab from 02/03/2018 in [facility] Cardiac and Pulmonary Rehab  Date  02/03/18      Visit Diagnosis: No diagnosis found.  Patient's Home Medications on Admission:  Current Outpatient Medications:  .  ALPRAZolam (XANAX) 0.5 MG tablet, TAKE ONE TABLET (0.5 MG TOTAL) BY MOUTH 3 (THREE) TIMES A DAY AS NEEDED FOR SLEEP OR ANXIETY., Disp: , Rfl:  .  aspirin EC 81 MG tablet, Take 81 mg by mouth daily., Disp: , Rfl:  .  Blood Glucose Monitoring Suppl (GLUCOCOM BLOOD GLUCOSE MONITOR) DEVI, 1 each by XX route as directed (For Blood Glucose Monitoring), Disp: , Rfl:  .  colchicine 0.6 MG tablet, Take 1 tablet (0.6 mg total) by mouth 2 (two) times daily. (Patient not taking: Reported on 10/04/2017), Disp: 60 tablet, Rfl: 2 .  furosemide (LASIX) 40 MG tablet, Take 1 tablet (40 mg total) by mouth daily. (Patient not taking: Reported on 10/04/2017), Disp: 30 tablet, Rfl: 2 .  glucose blood (PRECISION QID TEST) test strip, Use 1 each as directed (For Blood Glucose Monitoring 4 times daily) Use as instructed., Disp: , Rfl:  .  isosorbide mononitrate (IMDUR) 60 MG 24 hr tablet, Take 1 tablet (60 mg total) by mouth daily. (Patient not taking: Reported on 10/04/2017), Disp: 30 tablet, Rfl: 1 .  metoprolol succinate (TOPROL-XL) 100 MG 24 hr tablet, Take 100 mg by mouth daily., Disp: , Rfl: 3 .  metoprolol tartrate (LOPRESSOR) 12.5 mg TABS tablet, Take by mouth., Disp: , Rfl:  .  nitroGLYCERIN (NITROSTAT) 0.4 MG SL tablet, Place 0.4 mg under the tongue every 5 (five) minutes as needed for chest pain. , Disp: , Rfl: 1 .  pantoprazole (PROTONIX) 40 MG tablet, Take 40 mg by mouth 2 (two) times daily., Disp: , Rfl: 3 .  polyethylene  glycol (MIRALAX / GLYCOLAX) packet, Take 17 g by mouth daily. (Patient not taking: Reported on 10/04/2017), Disp: 14 each, Rfl: 0 .  potassium chloride (MICRO-K) 10 MEQ CR capsule, Take by mouth., Disp: , Rfl:  .  PROAIR HFA 108 (90 Base) MCG/ACT inhaler, Inhale 1-2 puffs into the lungs every 4 (four) hours as needed for wheezing. , Disp: , Rfl: 12 .  rosuvastatin (CRESTOR) 10 MG tablet, Take 10 mg by mouth every evening., Disp: , Rfl: 11 .  tamsulosin (FLOMAX) 0.4 MG CAPS capsule, Take 0.4 mg by mouth daily., Disp: , Rfl: 0 .  Tiotropium Bromide-Olodaterol (STIOLTO RESPIMAT) 2.5-2.5 MCG/ACT AERS, Inhale into the lungs., Disp: , Rfl:  .  topiramate (TOPAMAX) 50 MG tablet, Take 50 mg by mouth 2 (two) times daily., Disp: , Rfl: 0 .  torsemide (DEMADEX) 20 MG tablet, Take by mouth., Disp: , Rfl:  .  warfarin (COUMADIN) 5 MG tablet, Take 5-7.5 mg by mouth See admin instructions. Take 7.5 mg on Monday, Wednesday and Friday then take 5 mg all the other days, Disp: , Rfl: 0  Past Medical History: Past Medical History:  Diagnosis Date  . Antiphospholipid antibody syndrome ([place])   . CAD S/P percutaneous coronary angioplasty '98, '04, Feb 2017   [facility]  . Carotid arterial disease ([place]) 10/2015   moderate bilateral  . COPD (chronic obstructive pulmonary disease) ([place])   .  Coronary artery disease   . Hypertension   . Non-insulin treated type 2 diabetes mellitus ([place])   . Pulmonary emboli ([place]) 2011  . RBBB   . Sleep apnea     Tobacco Use: Social History   Tobacco Use  Smoking Status Former Smoker  . Packs/day: 0.50  . Types: Cigarettes  Smokeless Tobacco Never Used  Tobacco Comment   has started smoking again, willing to quit.    Labs: Recent Review Flowsheet Data    Labs for ITP Cardiac and Pulmonary Rehab Latest Ref Rng & Units 08/16/2016   Cholestrol 0 - 200 mg/dL 156   LDLCALC 0 - 99 mg/dL 64   HDL >40 mg/dL 28(L)   Trlycerides <150 mg/dL 322(H)   Hemoglobin A1c 4.8 - 5.6 % 5.9(H)        Exercise Target Goals: Exercise Program Goal: Individual exercise prescription set using results from initial 6 min walk test and THRR while considering  patient's activity barriers and safety.   Exercise Prescription Goal: Initial exercise prescription builds to 30-45 minutes a day of aerobic activity, 2-3 days per week.  Home exercise guidelines will be given to patient during program as part of exercise prescription that the participant will acknowledge.  Activity Barriers & Risk Stratification: Activity Barriers & Cardiac Risk Stratification - 02/03/18 1443      Activity Barriers & Cardiac Risk Stratification   Activity Barriers  Back Problems;Shortness of Breath;Other (comment);Deconditioning;Muscular Weakness;Balance Concerns;Decreased Ventricular Function    Comments  Serotoma on leg, history of laminectomies/back spams,SOB, vertigo    Cardiac Risk Stratification  High       6 Minute Walk: 6 Minute Walk    Row Name 02/03/18 1456         6 Minute Walk   Phase  Initial     Distance  1002 feet     Walk Time  6 minutes     # of Rest Breaks  0     MPH  1.89     METS  2.49     RPE  15     Perceived Dyspnea   3     VO2 Peak  8.74     Symptoms  Yes (comment)     Comments  back spasm/discomfort 8/10  cramps/claudication thigh 4/5     Resting HR  86 bpm     Resting BP  124/74     Resting Oxygen Saturation   98 %     Exercise Oxygen Saturation  during 6 min walk  95 %     Max Ex. HR  118 bpm     Max Ex. BP  142/72     2 Minute Post BP  134/70        Oxygen Initial Assessment:   Oxygen Re-Evaluation:   Oxygen Discharge (Final Oxygen Re-Evaluation):   Initial Exercise Prescription: Initial Exercise Prescription - 02/03/18 1400      Date of Initial Exercise RX and Referring Provider   Date  02/03/18    Referring Provider  Carmin Richmond MD      Treadmill   MPH  1.8    Grade  0.5    Minutes  15    METs  2.5      Recumbant Bike   Level  1     RPM  50    Watts  17    Minutes  15    METs  2.5      T5 Nustep  Level  1    SPM  80    Minutes  15    METs  2.5      Prescription Details   Frequency (times per week)  3    Duration  Progress to 45 minutes of aerobic exercise without signs/symptoms of physical distress      Intensity   THRR 40-80% of Max Heartrate  115-144    Ratings of Perceived Exertion  11-13    Perceived Dyspnea  0-4      Progression   Progression  Continue to progress workloads to maintain intensity without signs/symptoms of physical distress.      Resistance Training   Training Prescription  Yes    Weight  3 lbs    Reps  10-15       Perform Capillary Blood Glucose checks as needed.  Exercise Prescription Changes: Exercise Prescription Changes    Row Name 02/03/18 1400 02/08/18 0800           Response to Exercise   Blood Pressure (Admit)  124/74  124/70      Blood Pressure (Exercise)  142/72  142/74      Blood Pressure (Exit)  134/70  102/70      Heart Rate (Admit)  86 bpm  60 bpm      Heart Rate (Exercise)  118 bpm  111 bpm      Heart Rate (Exit)  80 bpm  90 bpm      Oxygen Saturation (Admit)  98 %  -      Oxygen Saturation (Exercise)  95 %  -      Rating of Perceived Exertion (Exercise)  15  12      Symptoms  back spasm/discomfort 8/10, cramps/claudication in thighs 4/5  none      Comments  walk test results  first day exercise      Duration  -  Progress to 45 minutes of aerobic exercise without signs/symptoms of physical distress      Intensity  -  THRR unchanged        Progression   Progression  -  Continue to progress workloads to maintain intensity without signs/symptoms of physical distress.      Average METs  -  2.5        Resistance Training   Training Prescription  -  Yes      Weight  -  3 lbs      Reps  -  10-15        Interval Training   Interval Training  -  No        Recumbant Bike   Level  -  1      RPM  -  60      Watts  -  17      Minutes  -  15      METs   -  2.49         Exercise Comments: Exercise Comments    Row Name 02/07/18 1741           Exercise Comments   First full day of exercise!  Patient was oriented to gym and equipment including functions, settings, policies, and procedures.  Patient's individual exercise prescription and treatment plan were reviewed.  All starting workloads were established based on the results of the 6 minute walk test done at initial orientation visit.  The plan for exercise progression was also introduced and progression will be customized based on patient's  performance and goals.          Exercise Goals and Review: Exercise Goals    Row Name 02/03/18 1459             Exercise Goals   Increase Physical Activity  Yes       Intervention  Provide advice, education, support and counseling about physical activity/exercise needs.;Develop an individualized exercise prescription for aerobic and resistive training based on initial evaluation findings, risk stratification, comorbidities and participant's personal goals.       Expected Outcomes  Short Term: Attend rehab on a regular basis to increase amount of physical activity.;Long Term: Add in home exercise to make exercise part of routine and to increase amount of physical activity.;Long Term: Exercising regularly at least 3-5 days a week.       Increase Strength and Stamina  Yes       Intervention  Provide advice, education, support and counseling about physical activity/exercise needs.;Develop an individualized exercise prescription for aerobic and resistive training based on initial evaluation findings, risk stratification, comorbidities and participant's personal goals.       Expected Outcomes  Short Term: Increase workloads from initial exercise prescription for resistance, speed, and METs.;Short Term: Perform resistance training exercises routinely during rehab and add in resistance training at home;Long Term: Improve cardiorespiratory fitness, muscular  endurance and strength as measured by increased METs and functional capacity (6MWT)       Able to understand and use rate of perceived exertion (RPE) scale  Yes       Intervention  Provide education and explanation on how to use RPE scale       Expected Outcomes  Short Term: Able to use RPE daily in rehab to express subjective intensity level;Long Term:  Able to use RPE to guide intensity level when exercising independently       Able to understand and use Dyspnea scale  Yes       Intervention  Provide education and explanation on how to use Dyspnea scale       Expected Outcomes  Short Term: Able to use Dyspnea scale daily in rehab to express subjective sense of shortness of breath during exertion;Long Term: Able to use Dyspnea scale to guide intensity level when exercising independently       Knowledge and understanding of Target Heart Rate Range (THRR)  Yes       Intervention  Provide education and explanation of THRR including how the numbers were predicted and where they are located for reference       Expected Outcomes  Short Term: Able to state/look up THRR;Short Term: Able to use daily as guideline for intensity in rehab;Long Term: Able to use THRR to govern intensity when exercising independently       Able to check pulse independently  Yes       Intervention  Provide education and demonstration on how to check pulse in carotid and radial arteries.;Review the importance of being able to check your own pulse for safety during independent exercise       Expected Outcomes  Short Term: Able to explain why pulse checking is important during independent exercise;Long Term: Able to check pulse independently and accurately       Understanding of Exercise Prescription  Yes       Intervention  Provide education, explanation, and written materials on patient's individual exercise prescription       Expected Outcomes  Short Term: Able to explain program exercise prescription;Long Term:  Able to explain  home exercise prescription to exercise independently       Improve claudication pain toleration; Improve walking ability  Yes       Intervention  Attend education sessions to aid in risk factor modification and understanding of disease process       Expected Outcomes  Long Term: Improve walking ability and toleration to claudication          Exercise Goals Re-Evaluation : Exercise Goals Re-Evaluation    Row Name 02/07/18 1741             Exercise Goal Re-Evaluation   Exercise Goals Review  Increase Physical Activity;Able to understand and use rate of perceived exertion (RPE) scale;Knowledge and understanding of Target Heart Rate Range (THRR);Understanding of Exercise Prescription;Increase Strength and Stamina       Comments  Reviewed RPE scale, THR and program prescription with pt today.  Pt voiced understanding and was given a copy of goals to take home.        Expected Outcomes  Short: Use RPE daily to regulate intensity. Long: Follow program prescription in THR.          Discharge Exercise Prescription (Final Exercise Prescription Changes): Exercise Prescription Changes - 02/08/18 0800      Response to Exercise   Blood Pressure (Admit)  124/70    Blood Pressure (Exercise)  142/74    Blood Pressure (Exit)  102/70    Heart Rate (Admit)  60 bpm    Heart Rate (Exercise)  111 bpm    Heart Rate (Exit)  90 bpm    Rating of Perceived Exertion (Exercise)  12    Symptoms  none    Comments  first day exercise    Duration  Progress to 45 minutes of aerobic exercise without signs/symptoms of physical distress    Intensity  THRR unchanged      Progression   Progression  Continue to progress workloads to maintain intensity without signs/symptoms of physical distress.    Average METs  2.5      Resistance Training   Training Prescription  Yes    Weight  3 lbs    Reps  10-15      Interval Training   Interval Training  No      Recumbant Bike   Level  1    RPM  60    Watts  17     Minutes  15    METs  2.49       Nutrition:  Target Goals: Understanding of nutrition guidelines, daily intake of sodium <1510m, cholesterol <2081m calories 30% from fat and 7% or less from saturated fats, daily to have 5 or more servings of fruits and vegetables.  Biometrics: Pre Biometrics - 02/03/18 1500      Pre Biometrics   Height  5' 6.9" (1.699 m)    Weight  239 lb 14.4 oz (108.8 kg)    Waist Circumference  45 inches    Hip Circumference  48 inches    Waist to Hip Ratio  0.94 %    BMI (Calculated)  37.7    Single Leg Stand  4.27 seconds        Nutrition Therapy Plan and Nutrition Goals:   Nutrition Assessments: Nutrition Assessments - 02/03/18 1439      MEDFICTS Scores   Pre Score  111       Nutrition Goals Re-Evaluation:   Nutrition Goals Discharge (Final Nutrition Goals Re-Evaluation):   Psychosocial: Target Goals:  Acknowledge presence or absence of significant depression and/or stress, maximize coping skills, provide positive support system. Participant is able to verbalize types and ability to use techniques and skills needed for reducing stress and depression.   Initial Review & Psychosocial Screening: Initial Psych Review & Screening - 02/03/18 1434      Initial Review   Current issues with  None Identified      Barriers   Psychosocial barriers to participate in program  There are no identifiable barriers or psychosocial needs.;The patient should benefit from training in stress management and relaxation.      Screening Interventions   Interventions  Encouraged to exercise;To provide support and resources with identified psychosocial needs;Provide feedback about the scores to participant    Expected Outcomes  Short Term goal: Utilizing psychosocial counselor, staff and physician to assist with identification of specific Stressors or current issues interfering with healing process. Setting desired goal for each stressor or current issue  identified.;Long Term Goal: Stressors or current issues are controlled or eliminated.;Short Term goal: Identification and review with participant of any Quality of Life or Depression concerns found by scoring the questionnaire.;Long Term goal: The participant improves quality of Life and PHQ9 Scores as seen by post scores and/or verbalization of changes       Quality of Life Scores:  Quality of Life - 02/03/18 1435      Quality of Life   Select  Quality of Life      Quality of Life Scores   Health/Function Pre  7.87 %    Socioeconomic Pre  14.36 %    Psych/Spiritual Pre  8.5 %    Family Pre  12 %    GLOBAL Pre  9.94 %      Scores of 19 and below usually indicate a poorer quality of life in these areas.  A difference of  2-3 points is a clinically meaningful difference.  A difference of 2-3 points in the total score of the Quality of Life Index has been associated with significant improvement in overall quality of life, self-image, physical symptoms, and general health in studies assessing change in quality of life.  PHQ-9: Recent Review Flowsheet Data    Depression screen [facility] 2/9 02/03/2018 10/04/2017   Decreased Interest 1 1   Down, Depressed, Hopeless 1 1   PHQ - 2 Score 2 2   Altered sleeping 1 2   Tired, decreased energy 2 3   Change in appetite 2 1   Feeling bad or failure about yourself  1 1   Trouble concentrating 0 0   Moving slowly or fidgety/restless 0 0   Suicidal thoughts 0 0   PHQ-9 Score 8 9   Difficult doing work/chores Somewhat difficult Somewhat difficult     Interpretation of Total Score  Total Score Depression Severity:  1-4 = Minimal depression, 5-9 = Mild depression, 10-14 = Moderate depression, 15-19 = Moderately severe depression, 20-27 = Severe depression   Psychosocial Evaluation and Intervention: Psychosocial Evaluation - 02/07/18 1658      Psychosocial Evaluation & Interventions   Interventions  Stress management education;Encouraged to exercise  with the program and follow exercise prescription    Comments  Counselor met with Mr. Taboada Merry Proud) today as he returned to this program following several months of attending to other health issues such as the seroma on his left leg.  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  He has a strong support  system with a spouse of 3 years; (4) adult children and (3) step children.  Merry Proud has multiple health issues with COPD; sleep apnea and the seroma on his leg.  He reports not sleeping very well with 4-6 hours per night, but now has a BPAP vs a CPAP and is on oxygen at night only.  He has a good appetite.  Merry Proud reports some undiagnosed depression and states his mood is generally negative most of the time.  His PHQ-9 Score is an "8" indicating mild symptoms of depression are present and his Quality of Life scores are extremely low at Global scores of 9.94.  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  He has goals to "survive" this program; to increase his energy and to hopefully quit smoking.  Counselor discussed the value of exercise for stress management - his mood and sleep and to speak with his Dr. about help for smoking cessation.  Staff will follow with him.    Expected Outcomes  Short:  Merry Proud with speak with his Dr. about help with smoking cessation.  He will also exercise for his health and mental health to cope more positively with stress.  Long:  Merry Proud will develop a routine of exercise and healthy lifestyle habits for his health and mental health.    Continue Psychosocial Services   Follow up required by staff       Psychosocial Re-Evaluation:   Psychosocial Discharge (Final Psychosocial Re-Evaluation):   Vocational Rehabilitation: Provide vocational rehab assistance to qualifying candidates.   Vocational Rehab Evaluation &  Intervention: Vocational Rehab - 02/03/18 1440      Initial Vocational Rehab Evaluation & Intervention   Assessment shows need for Vocational Rehabilitation  No       Education: Education Goals: Education classes will be provided on a variety of topics geared toward better understanding of heart health and risk factor modification. Participant will state understanding/return demonstration of topics presented as noted by education test scores.  Learning Barriers/Preferences: Learning Barriers/Preferences - 02/03/18 1439      Learning Barriers/Preferences   Learning Barriers  None    Learning Preferences  Audio;Skilled Demonstration;Verbal Instruction;Video;Individual Instruction       Education Topics:  AED/CPR: - Group verbal and written instruction with the use of models to demonstrate the basic use of the AED with the basic ABC's of resuscitation.   General Nutrition Guidelines/Fats and Fiber: -Group instruction provided by verbal, written material, models and posters to present the general guidelines for heart healthy nutrition. Gives an explanation and review of dietary fats and fiber.   Controlling Sodium/Reading Food Labels: -Group verbal and written material supporting the discussion of sodium use in heart healthy nutrition. Review and explanation with models, verbal and written materials for utilization of the food label.   Exercise Physiology & General Exercise Guidelines: - Group verbal and written instruction with models to review the exercise physiology of the cardiovascular system and associated critical values. Provides general exercise guidelines with specific guidelines to those with heart or lung disease.    Cardiac Rehab from 02/16/2018 in [facility] Cardiac and Pulmonary Rehab  Date  10/13/17  Educator  AS  Instruction Review Code  1- Verbalizes Understanding      Aerobic Exercise & Resistance Training: - Gives group verbal and written instruction on the  various components of exercise. Focuses  on aerobic and resistive training programs and the benefits of this training and how to safely progress through these programs..   Cardiac Rehab from 02/16/2018 in [facility] Cardiac and Pulmonary Rehab  Date  10/18/17  Educator  [facility]  Instruction Review Code  1- Geologist, engineering, Balance, Mind/Body Relaxation: Provides group verbal/written instruction on the benefits of flexibility and balance training, including mind/body exercise modes such as yoga, pilates and tai chi.  Demonstration and skill practice provided.   Cardiac Rehab from 02/16/2018 in [facility] Cardiac and Pulmonary Rehab  Date  02/07/18  Educator  AS  Instruction Review Code  1- Verbalizes Understanding      Stress and Anxiety: - Provides group verbal and written instruction about the health risks of elevated stress and causes of high stress.  Discuss the correlation between heart/lung disease and anxiety and treatment options. Review healthy ways to manage with stress and anxiety.   Depression: - Provides group verbal and written instruction on the correlation between heart/lung disease and depressed mood, treatment options, and the stigmas associated with seeking treatment.   Anatomy & Physiology of the Heart: - Group verbal and written instruction and models provide basic cardiac anatomy and physiology, with the coronary electrical and arterial systems. Review of Valvular disease and Heart Failure   Cardiac Procedures: - Group verbal and written instruction to review commonly prescribed medications for heart disease. Reviews the medication, class of the drug, and side effects. Includes the steps to properly store meds and maintain the prescription regimen. (beta blockers and nitrates)   Cardiac Rehab from 02/16/2018 in [place] Cardiac and Pulmonary Rehab  Date  02/16/18  Educator  [facility]  Instruction Review Code  1- Verbalizes Understanding      Cardiac Medications  I: - Group verbal and written instruction to review commonly prescribed medications for heart disease. Reviews the medication, class of the drug, and side effects. Includes the steps to properly store meds and maintain the prescription regimen.   Cardiac Medications II: -Group verbal and written instruction to review commonly prescribed medications for heart disease. Reviews the medication, class of the drug, and side effects. (all other drug classes)   Cardiac Rehab from 02/16/2018 in [facility] Cardiac and Pulmonary Rehab  Date  02/14/18  Educator  CE  Instruction Review Code  1- Verbalizes Understanding       Go Sex-Intimacy & Heart Disease, Get SMART - Goal Setting: - Group verbal and written instruction through game format to discuss heart disease and the return to sexual intimacy. Provides group verbal and written material to discuss and apply goal setting through the application of the S.M.A.R.T. Method.   Cardiac Rehab from 02/16/2018 in [facility] Cardiac and Pulmonary Rehab  Date  02/16/18  Educator  [facility]  Instruction Review Code  1- Verbalizes Understanding      Other Matters of the Heart: - Provides group verbal, written materials and models to describe Stable Angina and Peripheral Artery. Includes description of the disease process and treatment options available to the cardiac patient.   Exercise & Equipment Safety: - Individual verbal instruction and demonstration of equipment use and safety with use of the equipment.   Cardiac Rehab from 02/16/2018 in [facility] Cardiac and Pulmonary Rehab  Date  10/06/17  Educator  Nada Maclachlan  Instruction Review Code  1- Verbalizes Understanding      Infection Prevention: - Provides verbal and written material to individual with discussion of infection control including proper hand washing and  proper equipment cleaning during exercise session.   Cardiac Rehab from 02/16/2018 in [facility] Cardiac and Pulmonary Rehab  Date  10/04/17  Educator  [facility]   Instruction Review Code  1- Verbalizes Understanding      Falls Prevention: - Provides verbal and written material to individual with discussion of falls prevention and safety.   Cardiac Rehab from 02/16/2018 in [facility] Cardiac and Pulmonary Rehab  Date  10/04/17  Educator  [facility]  Instruction Review Code  1- Verbalizes Understanding      Diabetes: - Individual verbal and written instruction to review signs/symptoms of diabetes, desired ranges of glucose level fasting, after meals and with exercise. Acknowledge that pre and post exercise glucose checks will be done for 3 sessions at entry of program.   Cardiac Rehab from 02/16/2018 in [facility] Cardiac and Pulmonary Rehab  Date  10/04/17  Educator  [facility]  Instruction Review Code  1- Verbalizes Understanding      Know Your Numbers and Risk Factors: -Group verbal and written instruction about important numbers in your health.  Discussion of what are risk factors and how they play a role in the disease process.  Review of Cholesterol, Blood Pressure, Diabetes, and BMI and the role they play in your overall health.   Cardiac Rehab from 02/16/2018 in [facility] Cardiac and Pulmonary Rehab  Date  02/14/18  Educator  CE  Instruction Review Code  1- Verbalizes Understanding      Sleep Hygiene: -Provides group verbal and written instruction about how sleep can affect your health.  Define sleep hygiene, discuss sleep cycles and impact of sleep habits. Review good sleep hygiene tips.    Cardiac Rehab from 02/16/2018 in [facility] Cardiac and Pulmonary Rehab  Date  10/06/17  Educator  Lucianne Lei, MSW  Instruction Review Code  1- Verbalizes Understanding      Other: -Provides group and verbal instruction on various topics (see comments)   Knowledge Questionnaire Score: Knowledge Questionnaire Score - 02/03/18 1439      Knowledge Questionnaire Score   Pre Score  24/26   Correct responses were reviewed with Merry Proud today. He verbalized understanding and had no  furhter questions today.      Core Components/Risk Factors/Patient Goals at Admission: Personal Goals and Risk Factors at Admission - 02/03/18 1440      Core Components/Risk Factors/Patient Goals on Admission    Weight Management  Yes;Obesity;Weight Loss    Intervention  Weight Management: Develop a combined nutrition and exercise program designed to reach desired caloric intake, while maintaining appropriate intake of nutrient and fiber, sodium and fats, and appropriate energy expenditure required for the weight goal.;Weight Management: Provide education and appropriate resources to help participant work on and attain dietary goals.;Weight Management/Obesity: Establish reasonable short term and long term weight goals.;Obesity: Provide education and appropriate resources to help participant work on and attain dietary goals.    Admit Weight  239 lb 14.4 oz (108.8 kg)    Goal Weight: Short Term  237 lb (107.5 kg)    Goal Weight: Long Term  200 lb (90.7 kg)    Expected Outcomes  Short Term: Continue to assess and modify interventions until short term weight is achieved;Long Term: Adherence to nutrition and physical activity/exercise program aimed toward attainment of established weight goal;Weight Loss: Understanding of general recommendations for a balanced deficit meal plan, which promotes 1-2 lb weight loss per week and includes a negative energy balance of [phone] kcal/d    Tobacco Cessation  Yes  Number of packs per day  0.5    Intervention  Assist the participant in steps to quit. Provide individualized education and counseling about committing to Tobacco Cessation, relapse prevention, and pharmacological support that can be provided by physician.;Advice worker, assist with locating and accessing local/national Quit Smoking programs, and support quit date choice.    Expected Outcomes  Short Term: Will demonstrate readiness to quit, by selecting a quit date.;Short Term: Will  quit all tobacco product use, adhering to prevention of relapse plan.;Long Term: Complete abstinence from all tobacco products for at least 12 months from quit date.    Diabetes  Yes    Intervention  Provide education about signs/symptoms and action to take for hypo/hyperglycemia.;Provide education about proper nutrition, including hydration, and aerobic/resistive exercise prescription along with prescribed medications to achieve blood glucose in normal ranges: Fasting glucose 65-99 mg/dL    Expected Outcomes  Short Term: Participant verbalizes understanding of the signs/symptoms and immediate care of hyper/hypoglycemia, proper foot care and importance of medication, aerobic/resistive exercise and nutrition plan for blood glucose control.;Long Term: Attainment of HbA1C < 7%.    Hypertension  Yes    Intervention  Provide education on lifestyle modifcations including regular physical activity/exercise, weight management, moderate sodium restriction and increased consumption of fresh fruit, vegetables, and low fat dairy, alcohol moderation, and smoking cessation.;Monitor prescription use compliance.    Expected Outcomes  Short Term: Continued assessment and intervention until BP is < 140/74m HG in hypertensive participants. < 130/829mHG in hypertensive participants with diabetes, heart failure or chronic kidney disease.;Long Term: Maintenance of blood pressure at goal levels.    Lipids  Yes    Intervention  Provide education and support for participant on nutrition & aerobic/resistive exercise along with prescribed medications to achieve LDL <7024mHDL >69m42m  Expected Outcomes  Short Term: Participant states understanding of desired cholesterol values and is compliant with medications prescribed. Participant is following exercise prescription and nutrition guidelines.;Long Term: Cholesterol controlled with medications as prescribed, with individualized exercise RX and with personalized nutrition plan.  Value goals: LDL < 70mg53mL > 40 mg.       Core Components/Risk Factors/Patient Goals Review:    Core Components/Risk Factors/Patient Goals at Discharge (Final Review):    ITP Comments: ITP Comments    Row Name 02/03/18 1432 02/16/18 0624 03/11/18 0929 03/15/18 0657     ITP Comments  Readmit after time out for medical reasons. Itp sent to Dr M MilLoleta Chancereview,changes as needed and signature. Documnetation of diagnosis can be found in 01/10/2018 Office Visit.  30 day review. Continue with ITP unless direccted changes per Medical Director Chart Review. New to program  Jeff Merry Proudnot attended since 02/16/18.  He has not returned phone calls.  30 Day Review. Continue with ITP unless directed changes per Medical Director review       Comments: discharge ITP

## 2018-04-01 NOTE — Progress Notes (Signed)
Discharge Progress Report  Patient Details  Name: Jose [name] MRN: [id number] Date of Birth: [birth year] Referring Provider:     Cardiac Rehab from 02/03/2018 in [facility] Cardiac and Pulmonary Rehab  Referring Provider  Gerrianne Scale MD       Number of Visits: 7  Reason for Discharge:  Early Exit:  Lack of attendance  Smoking History:  Social History   Tobacco Use  Smoking Status Former Smoker  . Packs/day: 0.50  . Types: Cigarettes  Smokeless Tobacco Never Used  Tobacco Comment   has started smoking again, willing to quit.    Diagnosis:  No diagnosis found.  ADL UCSD:   Initial Exercise Prescription: Initial Exercise Prescription - 02/03/18 1400      Date of Initial Exercise RX and Referring Provider   Date  02/03/18    Referring Provider  Gerrianne Scale MD      Treadmill   MPH  1.8    Grade  0.5    Minutes  15    METs  2.5      Recumbant Bike   Level  1    RPM  50    Watts  17    Minutes  15    METs  2.5      T5 Nustep   Level  1    SPM  80    Minutes  15    METs  2.5      Prescription Details   Frequency (times per week)  3    Duration  Progress to 45 minutes of aerobic exercise without signs/symptoms of physical distress      Intensity   THRR 40-80% of Max Heartrate  115-144    Ratings of Perceived Exertion  11-13    Perceived Dyspnea  0-4      Progression   Progression  Continue to progress workloads to maintain intensity without signs/symptoms of physical distress.      Resistance Training   Training Prescription  Yes    Weight  3 lbs    Reps  10-15       Discharge Exercise Prescription (Final Exercise Prescription Changes): Exercise Prescription Changes - 02/08/18 0800      Response to Exercise   Blood Pressure (Admit)  124/70    Blood Pressure (Exercise)  142/74    Blood Pressure (Exit)  102/70    Heart Rate (Admit)  60 bpm    Heart Rate (Exercise)  111 bpm    Heart Rate (Exit)  90 bpm    Rating of Perceived  Exertion (Exercise)  12    Symptoms  none    Comments  first day exercise    Duration  Progress to 45 minutes of aerobic exercise without signs/symptoms of physical distress    Intensity  THRR unchanged      Progression   Progression  Continue to progress workloads to maintain intensity without signs/symptoms of physical distress.    Average METs  2.5      Resistance Training   Training Prescription  Yes    Weight  3 lbs    Reps  10-15      Interval Training   Interval Training  No      Recumbant Bike   Level  1    RPM  60    Watts  17    Minutes  15    METs  2.49       Functional Capacity: 6 Minute Walk  Row Name 02/03/18 1456         6 Minute Walk   Phase  Initial     Distance  1002 feet     Walk Time  6 minutes     # of Rest Breaks  0     MPH  1.89     METS  2.49     RPE  15     Perceived Dyspnea   3     VO2 Peak  8.74     Symptoms  Yes (comment)     Comments  back spasm/discomfort 8/10  cramps/claudication thigh 4/5     Resting HR  86 bpm     Resting BP  124/74     Resting Oxygen Saturation   98 %     Exercise Oxygen Saturation  during 6 min walk  95 %     Max Ex. HR  118 bpm     Max Ex. BP  142/72     2 Minute Post BP  134/70        Psychological, QOL, Others - Outcomes: PHQ 2/9: Depression screen [facility] 2/9 02/03/2018 10/04/2017  Decreased Interest 1 1  Down, Depressed, Hopeless 1 1  PHQ - 2 Score 2 2  Altered sleeping 1 2  Tired, decreased energy 2 3  Change in appetite 2 1  Feeling bad or failure about yourself  1 1  Trouble concentrating 0 0  Moving slowly or fidgety/restless 0 0  Suicidal thoughts 0 0  PHQ-9 Score 8 9  Difficult doing work/chores Somewhat difficult Somewhat difficult    Quality of Life: Quality of Life - 02/03/18 1435      Quality of Life   Select  Quality of Life      Quality of Life Scores   Health/Function Pre  7.87 %    Socioeconomic Pre  14.36 %    Psych/Spiritual Pre  8.5 %    Family Pre  12 %    GLOBAL  Pre  9.94 %       Personal Goals: Goals established at orientation with interventions provided to work toward goal. Personal Goals and Risk Factors at Admission - 02/03/18 1440      Core Components/Risk Factors/Patient Goals on Admission    Weight Management  Yes;Obesity;Weight Loss    Intervention  Weight Management: Develop a combined nutrition and exercise program designed to reach desired caloric intake, while maintaining appropriate intake of nutrient and fiber, sodium and fats, and appropriate energy expenditure required for the weight goal.;Weight Management: Provide education and appropriate resources to help participant work on and attain dietary goals.;Weight Management/Obesity: Establish reasonable short term and long term weight goals.;Obesity: Provide education and appropriate resources to help participant work on and attain dietary goals.    Admit Weight  239 lb 14.4 oz (108.8 kg)    Goal Weight: Short Term  237 lb (107.5 kg)    Goal Weight: Long Term  200 lb (90.7 kg)    Expected Outcomes  Short Term: Continue to assess and modify interventions until short term weight is achieved;Long Term: Adherence to nutrition and physical activity/exercise program aimed toward attainment of established weight goal;Weight Loss: Understanding of general recommendations for a balanced deficit meal plan, which promotes 1-2 lb weight loss per week and includes a negative energy balance of [phone] kcal/d    Tobacco Cessation  Yes    Number of packs per day  0.5    Intervention  Assist the participant  in steps to quit. Provide individualized education and counseling about committing to Tobacco Cessation, relapse prevention, and pharmacological support that can be provided by physician.;Education officer, environmental, assist with locating and accessing local/national Quit Smoking programs, and support quit date choice.    Expected Outcomes  Short Term: Will demonstrate readiness to quit, by selecting a  quit date.;Short Term: Will quit all tobacco product use, adhering to prevention of relapse plan.;Long Term: Complete abstinence from all tobacco products for at least 12 months from quit date.    Diabetes  Yes    Intervention  Provide education about signs/symptoms and action to take for hypo/hyperglycemia.;Provide education about proper nutrition, including hydration, and aerobic/resistive exercise prescription along with prescribed medications to achieve blood glucose in normal ranges: Fasting glucose 65-99 mg/dL    Expected Outcomes  Short Term: Participant verbalizes understanding of the signs/symptoms and immediate care of hyper/hypoglycemia, proper foot care and importance of medication, aerobic/resistive exercise and nutrition plan for blood glucose control.;Long Term: Attainment of HbA1C < 7%.    Hypertension  Yes    Intervention  Provide education on lifestyle modifcations including regular physical activity/exercise, weight management, moderate sodium restriction and increased consumption of fresh fruit, vegetables, and low fat dairy, alcohol moderation, and smoking cessation.;Monitor prescription use compliance.    Expected Outcomes  Short Term: Continued assessment and intervention until BP is < 140/8mm HG in hypertensive participants. < 130/61mm HG in hypertensive participants with diabetes, heart failure or chronic kidney disease.;Long Term: Maintenance of blood pressure at goal levels.    Lipids  Yes    Intervention  Provide education and support for participant on nutrition & aerobic/resistive exercise along with prescribed medications to achieve LDL 70mg , HDL >40mg .    Expected Outcomes  Short Term: Participant states understanding of desired cholesterol values and is compliant with medications prescribed. Participant is following exercise prescription and nutrition guidelines.;Long Term: Cholesterol controlled with medications as prescribed, with individualized exercise RX and with  personalized nutrition plan. Value goals: LDL < 70mg , HDL > 40 mg.        Personal Goals Discharge:   Exercise Goals and Review: Exercise Goals    Row Name 02/03/18 1459             Exercise Goals   Increase Physical Activity  Yes       Intervention  Provide advice, education, support and counseling about physical activity/exercise needs.;Develop an individualized exercise prescription for aerobic and resistive training based on initial evaluation findings, risk stratification, comorbidities and participant's personal goals.       Expected Outcomes  Short Term: Attend rehab on a regular basis to increase amount of physical activity.;Long Term: Add in home exercise to make exercise part of routine and to increase amount of physical activity.;Long Term: Exercising regularly at least 3-5 days a week.       Increase Strength and Stamina  Yes       Intervention  Provide advice, education, support and counseling about physical activity/exercise needs.;Develop an individualized exercise prescription for aerobic and resistive training based on initial evaluation findings, risk stratification, comorbidities and participant's personal goals.       Expected Outcomes  Short Term: Increase workloads from initial exercise prescription for resistance, speed, and METs.;Short Term: Perform resistance training exercises routinely during rehab and add in resistance training at home;Long Term: Improve cardiorespiratory fitness, muscular endurance and strength as measured by increased METs and functional capacity ( )       Able to understand and use  rate of perceived exertion (RPE) scale  Yes       Intervention  Provide education and explanation on how to use RPE scale       Expected Outcomes  Short Term: Able to use RPE daily in rehab to express subjective intensity level;Long Term:  Able to use RPE to guide intensity level when exercising independently       Able to understand and use Dyspnea scale  Yes        Intervention  Provide education and explanation on how to use Dyspnea scale       Expected Outcomes  Short Term: Able to use Dyspnea scale daily in rehab to express subjective sense of shortness of breath during exertion;Long Term: Able to use Dyspnea scale to guide intensity level when exercising independently       Knowledge and understanding of Target Heart Rate Range (THRR)  Yes       Intervention  Provide education and explanation of THRR including how the numbers were predicted and where they are located for reference       Expected Outcomes  Short Term: Able to state/look up THRR;Short Term: Able to use daily as guideline for intensity in rehab;Long Term: Able to use THRR to govern intensity when exercising independently       Able to check pulse independently  Yes       Intervention  Provide education and demonstration on how to check pulse in carotid and radial arteries.;Review the importance of being able to check your own pulse for safety during independent exercise       Expected Outcomes  Short Term: Able to explain why pulse checking is important during independent exercise;Long Term: Able to check pulse independently and accurately       Understanding of Exercise Prescription  Yes       Intervention  Provide education, explanation, and written materials on patient's individual exercise prescription       Expected Outcomes  Short Term: Able to explain program exercise prescription;Long Term: Able to explain home exercise prescription to exercise independently       Improve claudication pain toleration; Improve walking ability  Yes       Intervention  Attend education sessions to aid in risk factor modification and understanding of disease process       Expected Outcomes  Long Term: Improve walking ability and toleration to claudication          Exercise Goals Re-Evaluation: Exercise Goals Re-Evaluation    Row Name 02/07/18 1741             Exercise Goal Re-Evaluation    Exercise Goals Review  Increase Physical Activity;Able to understand and use rate of perceived exertion (RPE) scale;Knowledge and understanding of Target Heart Rate Range (THRR);Understanding of Exercise Prescription;Increase Strength and Stamina       Comments  Reviewed RPE scale, THR and program prescription with pt today.  Pt voiced understanding and was given a copy of goals to take home.        Expected Outcomes  Short: Use RPE daily to regulate intensity. Long: Follow program prescription in THR.          Nutrition & Weight - Outcomes: Pre Biometrics - 02/03/18 1500      Pre Biometrics   Height  5' 6.9" (1.699 m)    Weight  239 lb 14.4 oz (108.8 kg)    Waist Circumference  45 inches    Hip Circumference  48 inches  Waist to Hip Ratio  0.94 %    BMI (Calculated)  37.7    Single Leg Stand  4.27 seconds        Nutrition:   Nutrition Discharge: Nutrition Assessments - 02/03/18 1439      MEDFICTS Scores   Pre Score  111       Education Questionnaire Score: Knowledge Questionnaire Score - 02/03/18 1439      Knowledge Questionnaire Score   Pre Score  24/26   Correct responses were reviewed with Trey PaulaJeff today. He verbalized understanding and had no furhter questions today.      Goals reviewed with patient; copy given to patient.

## 2018-11-17 ENCOUNTER — Other Ambulatory Visit: Payer: Self-pay | Admitting: Internal Medicine

## 2018-11-17 ENCOUNTER — Other Ambulatory Visit (HOSPITAL_COMMUNITY): Payer: Self-pay | Admitting: Internal Medicine

## 2018-11-17 DIAGNOSIS — M5441 Lumbago with sciatica, right side: Secondary | ICD-10-CM

## 2018-11-17 DIAGNOSIS — G8929 Other chronic pain: Secondary | ICD-10-CM

## 2018-11-20 ENCOUNTER — Other Ambulatory Visit: Payer: Self-pay

## 2018-11-20 ENCOUNTER — Ambulatory Visit
Admission: RE | Admit: 2018-11-20 | Discharge: 2018-11-20 | Disposition: A | Discharge status: Home / Self Care | Payer: BC Managed Care – PPO | Source: Ambulatory Visit | Attending: Internal Medicine | Admitting: Internal Medicine

## 2018-11-20 DIAGNOSIS — M5441 Lumbago with sciatica, right side: Secondary | ICD-10-CM | POA: Diagnosis present

## 2018-11-20 DIAGNOSIS — G8929 Other chronic pain: Secondary | ICD-10-CM | POA: Diagnosis present

## 2018-11-20 LAB — POCT I-STAT CREATININE: Creatinine, Ser: 0.8 mg/dL (ref 0.61–1.24)

## 2018-11-20 MED ORDER — GADOBUTROL 1 MMOL/ML IV SOLN
10.0000 mL | Freq: Once | INTRAVENOUS | Status: AC | PRN
  Administered 2018-11-20: 12:00:00 10 mL via INTRAVENOUS

## 2018-12-27 ENCOUNTER — Emergency Department (HOSPITAL_COMMUNITY): Payer: BC Managed Care – PPO

## 2018-12-27 ENCOUNTER — Emergency Department (HOSPITAL_COMMUNITY)
Admission: EM | Admit: 2018-12-27 | Discharge: 2018-12-27 | Disposition: A | Discharge status: Home / Self Care | Payer: BC Managed Care – PPO | Attending: Emergency Medicine | Admitting: Emergency Medicine

## 2018-12-27 ENCOUNTER — Other Ambulatory Visit: Payer: Self-pay

## 2018-12-27 ENCOUNTER — Encounter (HOSPITAL_COMMUNITY): Payer: Self-pay | Admitting: Emergency Medicine

## 2018-12-27 DIAGNOSIS — I2510 Atherosclerotic heart disease of native coronary artery without angina pectoris: Secondary | ICD-10-CM | POA: Diagnosis not present

## 2018-12-27 DIAGNOSIS — R0602 Shortness of breath: Secondary | ICD-10-CM | POA: Diagnosis present

## 2018-12-27 DIAGNOSIS — Z7982 Long term (current) use of aspirin: Secondary | ICD-10-CM | POA: Insufficient documentation

## 2018-12-27 DIAGNOSIS — Z87891 Personal history of nicotine dependence: Secondary | ICD-10-CM | POA: Insufficient documentation

## 2018-12-27 DIAGNOSIS — J441 Chronic obstructive pulmonary disease with (acute) exacerbation: Secondary | ICD-10-CM | POA: Insufficient documentation

## 2018-12-27 DIAGNOSIS — I10 Essential (primary) hypertension: Secondary | ICD-10-CM | POA: Diagnosis not present

## 2018-12-27 DIAGNOSIS — E119 Type 2 diabetes mellitus without complications: Secondary | ICD-10-CM | POA: Diagnosis not present

## 2018-12-27 DIAGNOSIS — Z7901 Long term (current) use of anticoagulants: Secondary | ICD-10-CM | POA: Diagnosis not present

## 2018-12-27 DIAGNOSIS — Z20828 Contact with and (suspected) exposure to other viral communicable diseases: Secondary | ICD-10-CM | POA: Insufficient documentation

## 2018-12-27 LAB — COMPREHENSIVE METABOLIC PANEL
ALT: 43 U/L (ref 0–44)
AST: 22 U/L (ref 15–41)
Albumin: 3.4 g/dL — ABNORMAL LOW (ref 3.5–5.0)
Alkaline Phosphatase: 60 U/L (ref 38–126)
Anion gap: 10 (ref 5–15)
BUN: 15 mg/dL (ref 8–23)
CO2: 30 mmol/L (ref 22–32)
Calcium: 8.3 mg/dL — ABNORMAL LOW (ref 8.9–10.3)
Chloride: 98 mmol/L (ref 98–111)
Creatinine, Ser: 0.79 mg/dL (ref 0.61–1.24)
GFR calc Af Amer: >60 mL/min (ref >60)
GFR calc non Af Amer: >60 mL/min (ref >60)
Glucose, Bld: 122 mg/dL — ABNORMAL HIGH (ref 70–99)
Potassium: 3.8 mmol/L (ref 3.5–5.1)
Sodium: 138 mmol/L (ref 135–145)
Total Bilirubin: 0.4 mg/dL (ref 0.3–1.2)
Total Protein: 6.9 g/dL (ref 6.5–8.1)

## 2018-12-27 LAB — CBC WITH DIFFERENTIAL/PLATELET
Abs Immature Granulocytes: 0.03 10*3/uL (ref 0.00–0.07)
Basophils Absolute: 0 10*3/uL (ref 0.0–0.1)
Basophils Relative: 0 %
Eosinophils Absolute: 0.1 10*3/uL (ref 0.0–0.5)
Eosinophils Relative: 1 %
HCT: 42.5 % (ref 39.0–52.0)
Hemoglobin: 13.8 g/dL (ref 13.0–17.0)
Immature Granulocytes: 0 %
Lymphocytes Relative: 24 %
Lymphs Abs: 2.1 10*3/uL (ref 0.7–4.0)
MCH: 30.9 pg (ref 26.0–34.0)
MCHC: 32.5 g/dL (ref 30.0–36.0)
MCV: 95.3 fL (ref 80.0–100.0)
Monocytes Absolute: 1 10*3/uL (ref 0.1–1.0)
Monocytes Relative: 12 %
Neutro Abs: 5.5 10*3/uL (ref 1.7–7.7)
Neutrophils Relative %: 63 %
Platelets: 221 10*3/uL (ref 150–400)
RBC: 4.46 MIL/uL (ref 4.22–5.81)
RDW: 14.2 % (ref 11.5–15.5)
WBC: 8.7 10*3/uL (ref 4.0–10.5)
nRBC: 0 % (ref 0.0–0.2)

## 2018-12-27 LAB — POCT I-STAT EG7
Acid-Base Excess: 3 mmol/L — ABNORMAL HIGH (ref 0.0–2.0)
Bicarbonate: 31.8 mmol/L — ABNORMAL HIGH (ref 20.0–28.0)
Calcium, Ion: 1.12 mmol/L — ABNORMAL LOW (ref 1.15–1.40)
HCT: 46 % (ref 39.0–52.0)
Hemoglobin: 15.6 g/dL (ref 13.0–17.0)
O2 Saturation: 95 %
Potassium: 3.8 mmol/L (ref 3.5–5.1)
Sodium: 140 mmol/L (ref 135–145)
TCO2: 34 mmol/L — ABNORMAL HIGH (ref 22–32)
pCO2, Ven: 62.8 mmHg — ABNORMAL HIGH (ref 44.0–60.0)
pH, Ven: 7.313 (ref 7.250–7.430)
pO2, Ven: 86 mmHg — ABNORMAL HIGH (ref 32.0–45.0)

## 2018-12-27 LAB — BRAIN NATRIURETIC PEPTIDE: B Natriuretic Peptide: 126.5 pg/mL — ABNORMAL HIGH (ref 0.0–100.0)

## 2018-12-27 LAB — PROTIME-INR
INR: 2 — ABNORMAL HIGH (ref 0.8–1.2)
Prothrombin Time: 22.1 seconds — ABNORMAL HIGH (ref 11.4–15.2)

## 2018-12-27 LAB — TROPONIN I (HIGH SENSITIVITY): Troponin I (High Sensitivity): 7 ng/L (ref <18)

## 2018-12-27 LAB — SARS CORONAVIRUS 2 (TAT 6-24 HRS): SARS Coronavirus 2: NEGATIVE

## 2018-12-27 MED ORDER — WARFARIN SODIUM 6 MG PO TABS
6.0000 mg | ORAL_TABLET | Freq: Once | ORAL | Status: AC
  Administered 2018-12-27: 6 mg via ORAL
  Filled 2018-12-27: qty 1

## 2018-12-27 MED ORDER — METHYLPREDNISOLONE SODIUM SUCC 125 MG IJ SOLR
60.0000 mg | Freq: Once | INTRAMUSCULAR | Status: AC
  Administered 2018-12-27: 60 mg via INTRAVENOUS
  Filled 2018-12-27: qty 2

## 2018-12-27 MED ORDER — METHYLPREDNISOLONE 4 MG PO TBPK: ORAL_TABLET | ORAL | 0 refills | Status: DC

## 2018-12-27 MED ORDER — ALBUTEROL SULFATE HFA 108 (90 BASE) MCG/ACT IN AERS: 2.0000 | INHALATION_SPRAY | RESPIRATORY_TRACT | Status: DC | PRN

## 2018-12-27 MED ORDER — IOHEXOL 350 MG/ML SOLN
100.0000 mL | Freq: Once | INTRAVENOUS | Status: AC | PRN
  Administered 2018-12-27: 64 mL via INTRAVENOUS

## 2018-12-27 MED ORDER — IPRATROPIUM BROMIDE HFA 17 MCG/ACT IN AERS
2.0000 | INHALATION_SPRAY | RESPIRATORY_TRACT | Status: DC | PRN
  Administered 2018-12-27 (×2): 2 via RESPIRATORY_TRACT
  Filled 2018-12-27: qty 12.9

## 2018-12-27 MED ORDER — ALBUTEROL SULFATE (5 MG/ML) 0.5% IN NEBU: 2.5000 mg | INHALATION_SOLUTION | Freq: Four times a day (QID) | RESPIRATORY_TRACT | 12 refills | Status: AC | PRN

## 2018-12-27 MED ORDER — IPRATROPIUM-ALBUTEROL 0.5-2.5 (3) MG/3ML IN SOLN
3.0000 mL | Freq: Once | RESPIRATORY_TRACT | Status: AC
  Administered 2018-12-27: 3 mL via RESPIRATORY_TRACT
  Filled 2018-12-27: qty 3

## 2018-12-27 NOTE — ED Notes (Signed)
Pt returned from CT °

## 2018-12-27 NOTE — ED Provider Notes (Signed)
[facility] EMERGENCY DEPARTMENT Provider Note   CSN: [id number] Arrival date & time: 12/27/18  0027     History   Chief Complaint Chief Complaint  Patient presents with   Shortness of Breath    HPI Mustaf Antonacci is a 63 y.o. male past medical history of antiphospholipid syndrome, PE, on Coumadin 6 mg daily, coronary artery disease status post CABG, COPD (not on home oxygen at baseline, baseline O2 96%, BiPAP at night), hypertension, hyperlipidemia, obesity, presenting to the emergency department shortness of breath.  The patient reports that he has had progressively worsening shortness of breath for the past week and a half.  He reports this is both dyspnea on exertion and at rest.  He states he is barely able to perform his daily activities, including washing his hair, which leaves him feeling very winded.  He denies any chest pain, lightheadedness or syncope.  He denies any new swelling in his legs.  He reports he has been monitoring his oxygen at home and his numbers have been "running in the 80s" when he normally runs around 96% O2 on room air.  He has COPD and has been using his pumps at home with little relief.  He reports that he was due for his Coumadin at 11 PM last night.  However because he unfortunately had a very long overnight wait in the waiting room he did not take that dose.  He is normally scheduled to take 6 mg of Coumadin daily.  He reports he has not missed any other doses.  He does report a cough for the past week and a half which is dry and nonproductive.  He denies fevers or chills.  He states his wife was sick with URI type symptoms 2 weeks ago but she tested negative for COVID-19.  Many of his doctors are at [facility].     HPI  Past Medical History:  Diagnosis Date   Antiphospholipid antibody syndrome (HCC)    CAD S/P percutaneous coronary angioplasty '98, '04, Feb 2017   [facility]   Carotid arterial disease (HCC) 10/2015   moderate  bilateral   COPD (chronic obstructive pulmonary disease) (HCC)    Coronary artery disease    Hypertension    Non-insulin treated type 2 diabetes mellitus (HCC)    Pulmonary emboli (HCC) 2011   RBBB    Sleep apnea     Patient Active Problem List   Diagnosis Date Noted   Coronary atherosclerosis 10/04/2017   GERD (gastroesophageal reflux disease) 10/04/2017   Hypertension 10/04/2017   Pulmonary embolism (HCC) 10/04/2017   Transient visual loss 10/04/2017   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   Nonrheumatic aortic valve stenosis 03/31/2017   Pericarditis, Acute 02/12/2017   Acute on chronic right heart failure (HCC) 02/11/2017   Angina at rest [facility]) 08/17/2016   Unstable angina (HCC) 08/16/2016   Tobacco abuse 08/16/2016   Anti-phospholipid syndrome (HCC) 08/16/2016   Hyperlipidemia 08/16/2016   Essential hypertension, benign 08/16/2016   COPD (chronic obstructive pulmonary disease) (HCC) 08/16/2016   Sleep apnea 08/16/2016   CAD S/P multiple PCIs 08/16/2016   History of pulmonary embolus (PE)-2011 08/16/2016   Chronic anticoagulation-Couamdin 08/16/2016   Non-insulin treated type 2 diabetes mellitus (HCC) 08/16/2016   Carotid artery disease (HCC) 08/16/2016   RBBB (right bundle branch block) 08/16/2016   Dyspnea on exertion 10/17/2015   ED (erectile dysfunction) 08/09/2012    Past Surgical History:  Procedure Laterality Date   APPENDECTOMY     BACK  SURGERY  '89, '06   COLONOSCOPY     CORONARY ANGIOPLASTY WITH STENT PLACEMENT  '98, '04, Feb 2017        Home Medications    Prior to Admission medications   Medication Sig Start Date End Date Taking? Authorizing Provider  albuterol (PROVENTIL) (5 MG/ML) 0.5% nebulizer solution Take 0.5 mLs (2.5 mg total) by nebulization every 6 (six) hours as needed for wheezing or shortness of breath. 12/27/18   Terald Sleeper, MD  ALPRAZolam Prudy Feeler) 0.5 MG tablet TAKE ONE TABLET (0.5 MG TOTAL) BY MOUTH 3  (THREE) TIMES A DAY AS NEEDED FOR SLEEP OR ANXIETY. 09/13/17   [provider]  aspirin EC 81 MG tablet Take 81 mg by mouth daily.    [provider]  Blood Glucose Monitoring Suppl (GLUCOCOM BLOOD GLUCOSE MONITOR) DEVI 1 each by XX route as directed (For Blood Glucose Monitoring) 08/23/17   [provider]  colchicine 0.6 MG tablet Take 1 tablet (0.6 mg total) by mouth 2 (two) times daily. Patient not taking: Reported on 10/04/2017 02/12/17   Shon Hale, MD  furosemide (LASIX) 40 MG tablet Take 1 tablet (40 mg total) by mouth daily. Patient not taking: Reported on 10/04/2017 02/12/17   Shon Hale, MD  isosorbide mononitrate (IMDUR) 60 MG 24 hr tablet Take 1 tablet (60 mg total) by mouth daily. Patient not taking: Reported on 10/04/2017 02/13/17   Shon Hale, MD  methylPREDNISolone (MEDROL DOSEPAK) 4 MG TBPK tablet Use as directed on package 12/27/18   Terald Sleeper, MD  metoprolol succinate (TOPROL-XL) 100 MG 24 hr tablet Take 100 mg by mouth daily. 08/04/16   [provider]  metoprolol tartrate (LOPRESSOR) 12.5 mg TABS tablet Take by mouth. 08/22/17 08/22/18  [provider]  nitroGLYCERIN (NITROSTAT) 0.4 MG SL tablet Place 0.4 mg under the tongue every 5 (five) minutes as needed for chest pain.  07/07/16   [provider]  pantoprazole (PROTONIX) 40 MG tablet Take 40 mg by mouth 2 (two) times daily. 06/03/16   [provider]  polyethylene glycol (MIRALAX / GLYCOLAX) packet Take 17 g by mouth daily. Patient not taking: Reported on 10/04/2017 02/12/17   Shon Hale, MD  potassium chloride (MICRO-K) 10 MEQ CR capsule Take by mouth. 08/22/17 08/22/18  [provider]  PROAIR HFA 108 (90 Base) MCG/ACT inhaler Inhale 1-2 puffs into the lungs every 4 (four) hours as needed for wheezing.  07/07/16   [provider]  rosuvastatin (CRESTOR) 10 MG tablet Take 10 mg by mouth every evening. 07/07/16   [provider]  tamsulosin (FLOMAX) 0.4 MG CAPS capsule Take 0.4 mg by mouth daily. 06/05/16   [provider]  Tiotropium Bromide-Olodaterol (STIOLTO RESPIMAT) 2.5-2.5 MCG/ACT AERS Inhale into the lungs. 09/10/17   [provider]  topiramate (TOPAMAX) 50 MG tablet Take 50 mg by mouth 2 (two) times daily. 06/05/16   [provider]  torsemide (DEMADEX) 20 MG tablet Take by mouth. 07/05/17 07/05/18  [provider]  warfarin (COUMADIN) 5 MG tablet Take 5-7.5 mg by mouth See admin instructions. Take 7.5 mg on Monday, Wednesday and Friday then take 5 mg all the other days 07/17/16   [provider]    Family History Family History  Problem Relation Age of Onset   CAD Father 72    Social History Social History   Tobacco Use   Smoking status: Former Smoker    Packs/day: 0.50    Types: Cigarettes  Smokeless tobacco: Never Used   Tobacco comment: has started smoking again, willing to quit.  Substance Use Topics   Alcohol use: No   Drug use: No     Allergies   Melatonin and Ramipril   Review of Systems Review of Systems  Constitutional: Negative for chills and fever.  Eyes: Negative for photophobia and visual disturbance.  Respiratory: Positive for cough, shortness of breath and wheezing.   Cardiovascular: Negative for chest pain and palpitations.  Gastrointestinal: Negative for nausea and vomiting.  Musculoskeletal: Negative for arthralgias and back pain.  Neurological: Negative for syncope, light-headedness and headaches.  Psychiatric/Behavioral: Negative for agitation.  All other systems reviewed and are negative.    Physical Exam Updated Vital Signs BP (!) 159/75 (BP Location: Right Arm)    Pulse 93    Temp 98.6 F (37 C) (Oral)    Resp 18    Ht 5\' 8"  (1.727 m)    Wt 111.1 kg    SpO2 93%    BMI 37.25 kg/m   Physical Exam Vitals signs and nursing note reviewed.  Constitutional:      Appearance: He is well-developed.    HENT:     Head: Normocephalic and atraumatic.  Eyes:     Conjunctiva/sclera: Conjunctivae normal.  Neck:     Musculoskeletal: Neck supple.  Cardiovascular:     Rate and Rhythm: Normal rate and regular rhythm.     Pulses: Normal pulses.  Pulmonary:     Effort: Pulmonary effort is normal. No accessory muscle usage or respiratory distress.     Breath sounds: Wheezing present.     Comments: 96% on 6 L [place] Abdominal:     Palpations: Abdomen is soft.     Tenderness: There is no abdominal tenderness.  Musculoskeletal:     Right lower leg: Edema present.     Left lower leg: Edema present.  Skin:    General: Skin is warm and dry.  Neurological:     Mental Status: He is alert.      ED Treatments / Results  Labs (all labs ordered are listed, but only abnormal results are displayed) Labs Reviewed  COMPREHENSIVE METABOLIC PANEL - Abnormal; Notable for the following components:      Result Value   Glucose, Bld 122 (*)    Calcium 8.3 (*)    Albumin 3.4 (*)    All other components within normal limits  PROTIME-INR - Abnormal; Notable for the following components:   Prothrombin Time 22.1 (*)    INR 2.0 (*)    All other components within normal limits  BRAIN NATRIURETIC PEPTIDE - Abnormal; Notable for the following components:   B Natriuretic Peptide 126.5 (*)    All other components within normal limits  POCT I-STAT EG7 - Abnormal; Notable for the following components:   pCO2, Ven 62.8 (*)    pO2, Ven 86.0 (*)    Bicarbonate 31.8 (*)    TCO2 34 (*)    Acid-Base Excess 3.0 (*)    Calcium, Ion 1.12 (*)    All other components within normal limits  SARS CORONAVIRUS 2 (TAT 6-24 HRS)  CBC WITH DIFFERENTIAL/PLATELET  BLOOD GAS, VENOUS  TROPONIN I (HIGH SENSITIVITY)    EKG EKG Interpretation  Date/Time:  Tuesday December 27 2018 00:42:46 EDT Ventricular Rate:  79 PR Interval:  146 QRS Duration: 138 QT Interval:  444 QTC Calculation: 509 R Axis:   42 Text Interpretation:   Normal sinus rhythm Right bundle branch block T  wave abnormality, consider lateral ischemia Abnormal ECG When compared with ECG of 02/11/2017, No significant change was found Confirmed by Dione BoozeGlick, David ([id number]) on 12/27/2018 12:57:53 AM   Radiology Dg Chest 2 View  Result Date: 12/27/2018 CLINICAL DATA:  Shortness of breath, increasing for the last week and a half, nonproductive cough EXAM: CHEST - 2 VIEW COMPARISON:  Radiograph 09/27/2018, CT 02/10/2017 FINDINGS: Chronic interstitial changes are noted throughout the lungs most prevalent in the lung bases with some increasing right basilar opacity. The pulmonary vascularity is somewhat indistinct. Postsurgical changes related to prior CABG including intact and aligned sternotomy wires and multiple surgical clips projecting over the mediastinum. Coronary stents are noted as well. No pneumothorax or effusion. Degenerative changes are present in the imaged spine and shoulders. No acute osseous or soft tissue abnormality. IMPRESSION: 1. Chronic interstitial lung disease with some increasing right basilar opacity, which could reflect early edema or infection Electronically Signed   By: Kreg ShropshirePrice  DeHay M.D.   On: 12/27/2018 01:50   Ct Angio Chest Pe W/cm &/or Wo Cm  Result Date: 12/27/2018 CLINICAL DATA:  Chest pain EXAM: CT ANGIOGRAPHY CHEST WITH CONTRAST TECHNIQUE: Multidetector CT imaging of the chest was performed using the standard protocol during bolus administration of intravenous contrast. Multiplanar CT image reconstructions and MIPs were obtained to evaluate the vascular anatomy. CONTRAST:  64mL OMNIPAQUE IOHEXOL 350 MG/ML SOLN COMPARISON:  02/10/2017 FINDINGS: Cardiovascular: No filling defects in the pulmonary arteries to suggest pulmonary emboli. Prior CABG. Aorta is normal caliber with scattered calcifications. Heart is borderline in size. Mediastinum/Nodes: No mediastinal, hilar, or axillary adenopathy. Trachea and esophagus are unremarkable.  Lungs/Pleura: Linear scarring at the lung bases. No acute confluent opacities or effusions. Upper Abdomen: Imaging into the upper abdomen shows no acute findings. Diffuse fatty infiltration of the liver noted. Musculoskeletal: Chest wall soft tissues are unremarkable. No acute bony abnormality. Review of the MIP images confirms the above findings. IMPRESSION: No evidence of pulmonary embolus. No acute cardiopulmonary disease. Fatty infiltration of the liver. Aortic Atherosclerosis (ICD10-I70.0). Electronically Signed   By: Charlett NoseKevin  Dover M.D.   On: 12/27/2018 09:57    Procedures Procedures (including critical care time)  Medications Ordered in ED Medications  albuterol (VENTOLIN HFA) 108 (90 Base) MCG/ACT inhaler 2 puff (has no administration in time range)  ipratropium (ATROVENT HFA) inhaler 2 puff (2 puffs Inhalation Given 12/27/18 1224)  warfarin (COUMADIN) tablet 6 mg (6 mg Oral Given 12/27/18 0931)  iohexol (OMNIPAQUE) 350 MG/ML injection 100 mL (64 mLs Intravenous Contrast Given 12/27/18 0930)  ipratropium-albuterol (DUONEB) 0.5-2.5 (3) MG/3ML nebulizer solution 3 mL (3 mLs Nebulization Given 12/27/18 1322)  methylPREDNISolone sodium succinate (SOLU-MEDROL) 125 mg/2 mL injection 60 mg (60 mg Intravenous Given 12/27/18 1330)     Initial Impression / Assessment and Plan / ED Course  I have reviewed the triage vital signs and the nursing notes.  Pertinent labs & imaging results that were available during my care of the patient were reviewed by me and considered in my medical decision making (see chart for details).  63 year old male with a history as documented above presenting with progressively worsening shortness of breath for the past week and a half.  Differential is broad given his history and includes acute coronary syndrome versus pulmonary embolism versus viral pneumonia versus bacterial pneumonia versus COPD exacerbation.  He is wheezing on exam bilaterally.  We will try some  breathing treatments.  We will also check his blood gas.  Chest x-ray shows possible opacity in  the left lower lobe versus pulmonary edema.  Given his high risk for pulmonary embolism obtain a CT PE which will help better characterize his collection as well as search for blood clots.  His EKG on arrival does not show signs of acute ischemia but we will check a troponin level.  Will also need to check BNP as heart failure is on the differential.  Clinical Course as of Dec 26 1721  Tue Dec 27, 2018  1009 IMPRESSION: No evidence of pulmonary embolus.  No acute cardiopulmonary disease.  Fatty infiltration of the liver.  Aortic Atherosclerosis (ICD10-I70.0).   [MT]  1204 Patient remains subjectively SOB, 95% on 3L [place].  Still wheezing.  He has not received breathing treatments, I spoke to his nurse and respiratory therapy about administering those treatments.  Will reassess afterwards   [MT]  1205 At this time given his workup, I suspect this is most likely a COPD exacerbation.  Less likely ACS, CHF, or PE given his labs and CT imaging.  No PNA on CT scan.   [MT]  1302 SARS Coronavirus 2: NEGATIVE [MT]    Clinical Course User Index [MT] Cherelle Midkiff, Kermit Balo, MD     Final Clinical Impressions(s) / ED Diagnoses   Final diagnoses:  COPD exacerbation (HCC)  Shortness of breath    ED Discharge Orders         Ordered    methylPREDNISolone (MEDROL DOSEPAK) 4 MG TBPK tablet     12/27/18 1411    albuterol (PROVENTIL) (5 MG/ML) 0.5% nebulizer solution  Every 6 hours PRN     12/27/18 1411    For home use only DME Nebulizer machine     12/27/18 1411           Terald Sleeper, MD 12/27/18 1723

## 2018-12-27 NOTE — Discharge Instructions (Signed)
Call your primary care doctor to discuss your ER visit.  We recommended you having a nebulizer machine at home for albuterol treatments every 4-6 hours when you are wheezing or having difficulty breathing.  Your workup today included bloodtests and CT scan of your lungs.  This did not show signs of blood clot in your lungs, pneumonia, heart attack, or heart failure.  Your blood tests and physical exam were suggestive of a COPD flare up.  We prescribed you steroids and advised you to continue breathing treatments at home.  You can use supplemental oxygen, 2-3 liters, with a nasal canula with your home oxygen supply - your goal is to keep your oxygen number above 90% O2.

## 2018-12-27 NOTE — ED Triage Notes (Signed)
Patient here with shortness of breath, which has been increasing in the last week and a half.  Patient has non productive cough, on CPAP and home oxygen.  Patient is CAOx4.

## 2018-12-27 NOTE — ED Notes (Signed)
Pt verbalized understanding of discharge instructions. Prescriptions reviewed. Pt has no further questions at this time

## 2018-12-27 NOTE — ED Notes (Signed)
Patient transported to CT 

## 2018-12-27 NOTE — ED Notes (Signed)
To CT scan

## 2019-03-07 ENCOUNTER — Encounter (HOSPITAL_BASED_OUTPATIENT_CLINIC_OR_DEPARTMENT_OTHER): Payer: BC Managed Care – PPO | Attending: Internal Medicine | Admitting: Internal Medicine

## 2019-03-07 ENCOUNTER — Other Ambulatory Visit: Payer: Self-pay

## 2019-03-07 DIAGNOSIS — G4733 Obstructive sleep apnea (adult) (pediatric): Secondary | ICD-10-CM | POA: Diagnosis not present

## 2019-03-07 DIAGNOSIS — J449 Chronic obstructive pulmonary disease, unspecified: Secondary | ICD-10-CM | POA: Insufficient documentation

## 2019-03-07 DIAGNOSIS — E1151 Type 2 diabetes mellitus with diabetic peripheral angiopathy without gangrene: Secondary | ICD-10-CM | POA: Insufficient documentation

## 2019-03-07 DIAGNOSIS — E11622 Type 2 diabetes mellitus with other skin ulcer: Secondary | ICD-10-CM | POA: Diagnosis present

## 2019-03-07 DIAGNOSIS — I2510 Atherosclerotic heart disease of native coronary artery without angina pectoris: Secondary | ICD-10-CM | POA: Insufficient documentation

## 2019-03-07 DIAGNOSIS — Z951 Presence of aortocoronary bypass graft: Secondary | ICD-10-CM | POA: Diagnosis not present

## 2019-03-07 DIAGNOSIS — E7800 Pure hypercholesterolemia, unspecified: Secondary | ICD-10-CM | POA: Diagnosis not present

## 2019-03-07 DIAGNOSIS — L97113 Non-pressure chronic ulcer of right thigh with necrosis of muscle: Secondary | ICD-10-CM | POA: Insufficient documentation

## 2019-03-07 DIAGNOSIS — I252 Old myocardial infarction: Secondary | ICD-10-CM | POA: Diagnosis not present

## 2019-03-07 DIAGNOSIS — Z9889 Other specified postprocedural states: Secondary | ICD-10-CM | POA: Diagnosis not present

## 2019-03-07 DIAGNOSIS — I4510 Unspecified right bundle-branch block: Secondary | ICD-10-CM | POA: Insufficient documentation

## 2019-03-07 DIAGNOSIS — I110 Hypertensive heart disease with heart failure: Secondary | ICD-10-CM | POA: Insufficient documentation

## 2019-03-07 DIAGNOSIS — E785 Hyperlipidemia, unspecified: Secondary | ICD-10-CM | POA: Diagnosis not present

## 2019-03-07 DIAGNOSIS — I509 Heart failure, unspecified: Secondary | ICD-10-CM | POA: Insufficient documentation

## 2019-03-07 DIAGNOSIS — I6523 Occlusion and stenosis of bilateral carotid arteries: Secondary | ICD-10-CM | POA: Diagnosis not present

## 2019-03-07 DIAGNOSIS — F17200 Nicotine dependence, unspecified, uncomplicated: Secondary | ICD-10-CM | POA: Diagnosis not present

## 2019-03-07 DIAGNOSIS — E1140 Type 2 diabetes mellitus with diabetic neuropathy, unspecified: Secondary | ICD-10-CM | POA: Diagnosis not present

## 2019-03-07 DIAGNOSIS — Z86711 Personal history of pulmonary embolism: Secondary | ICD-10-CM | POA: Diagnosis not present

## 2019-03-07 DIAGNOSIS — K219 Gastro-esophageal reflux disease without esophagitis: Secondary | ICD-10-CM | POA: Insufficient documentation

## 2019-03-07 NOTE — Progress Notes (Signed)
Jose [name], Jose [name] ([id number]) Visit Report for 03/07/2019 Abuse/Suicide Risk Screen Details Patient Name: Date of Service: Jose [name], Jose [name] 03/07/2019 2:45 PM Medical Record [id number] Patient Account Number: [id number] Date of Birth/Sex: Treating RN: Dec 17, 1955 (63 y.o. Tammy Sours Primary Care Chyrel Taha: Jola Babinski Other Clinician: Referring Jeryn Bertoni: Treating Gaylon Melchor/Extender:Robson, Lars Mage, Regis Bill in Treatment: 0 Abuse/Suicide Risk Screen Items Answer ABUSE RISK SCREEN: Has anyone close to you tried to hurt or harm you recentlyo No Do you feel uncomfortable with anyone in your familyo No Has anyone forced you do things that you didnt want to doo No Electronic Signature(s) Signed: 03/07/2019 5:14:15 PM By: Shawn Stall Entered By: Shawn Stall on 03/07/2019 16:34:37 -------------------------------------------------------------------------------- Activities of Daily Living Details Patient Name: Date of Service: Jose [name], Jose [name] 03/07/2019 2:45 PM Medical Record [id number] Patient Account Number: [id number] Date of Birth/Sex: Treating RN: April 07, 1955 (63 y.o. Tammy Sours Primary Care Jayce Kainz: Jola Babinski Other Clinician: Referring Dynastee Brummell: Treating Zelphia Glover/Extender:Robson, Lars Mage, Regis Bill in Treatment: 0 Activities of Daily Living Items Answer Activities of Daily Living (Please select one for each item) Drive Automobile Completely Able Take Medications Completely Able Use Telephone Completely Able Care for Appearance Completely Able Use Toilet Completely Able Bath / Shower Completely Able Dress Self Completely Able Feed Self Completely Able Walk Completely Able Get In / Out Bed Completely Able Housework Completely Able Prepare Meals Completely Able Handle Money Completely Able Shop for Self Completely Able Electronic Signature(s) Signed: 03/07/2019 5:14:15 PM By: Shawn Stall Entered By: Shawn Stall on  03/07/2019 16:34:54 -------------------------------------------------------------------------------- Education Screening Details Patient Name: Date of Service: Jose [name], Jose [name] 03/07/2019 2:45 PM Medical Record [id number] Patient Account Number: [id number] Date of Birth/Sex: Treating RN: Apr 14, 1955 (63 y.o. Tammy Sours Primary Care Sidnie Swalley: Jola Babinski Other Clinician: Referring Bodie Abernethy: Treating Kyleeann Cremeans/Extender:Robson, Lars Mage, Regis Bill in Treatment: 0 Primary Learner Assessed: Patient Learning Preferences/Education Level/Primary Language Learning Preference: Explanation, Demonstration, Printed Material Highest Education Level: College or Above Preferred Language: English Cognitive Barrier Language Barrier: No Translator Needed: No Memory Deficit: No Emotional Barrier: No Cultural/Religious Beliefs Affecting Medical Care: No Physical Barrier Impaired Vision: Yes Glasses Impaired Hearing: No Decreased Hand dexterity: No Knowledge/Comprehension Knowledge Level: High Comprehension Level: High Ability to understand written High instructions: Ability to understand verbal High instructions: Motivation Anxiety Level: Calm Cooperation: Cooperative Education Importance: Acknowledges Need Interest in Health Problems: Asks Questions Perception: Coherent Willingness to Engage in Self- High Management Activities: Readiness to Engage in Self- High Management Activities: Electronic Signature(s) Signed: 03/07/2019 5:14:15 PM By: Shawn Stall Entered By: Shawn Stall on 03/07/2019 16:35:17 -------------------------------------------------------------------------------- Fall Risk Assessment Details Patient Name: Date of Service: Jose [name], Jose [name] 03/07/2019 2:45 PM Medical Record [id number] Patient Account Number: [id number] Date of Birth/Sex: Treating RN: [birth year] (63 y.o. Tammy Sours Primary Care Aftyn Nott: Jola Babinski Other  Clinician: Referring Hezekiah Veltre: Treating Damyra Luscher/Extender:Robson, Lars Mage, Regis Bill in Treatment: 0 Fall Risk Assessment Items Have you had 2 or more falls in the last 12 monthso 0 No Have you had any fall that resulted in injury in the last 12 monthso 0 No FALLS RISK SCREEN History of falling - immediate or within 3 months 0 No Secondary diagnosis (Do you have 2 or more medical diagnoseso) 0 No Ambulatory aid None/bed rest/wheelchair/nurse 0 Yes Crutches/cane/walker 0 No Furniture 0 No Intravenous therapy Access/Saline/Heparin Lock 0 No Weak (short steps with or without shuffle, stooped but able to lift head 0 No while walking, may seek support from furniture) Impaired (short steps with shuffle, may have difficulty arising from chair,  0 No head down, impaired balance) Mental Status Oriented to own ability 0 Yes Overestimates or forgets limitations 0 No Risk Level: Low Risk Score: 0 Electronic Signature(s) Signed: 03/07/2019 5:14:15 PM By: Deon Pilling Entered By: Deon Pilling on 03/07/2019 16:35:29 -------------------------------------------------------------------------------- Foot Assessment Details Patient Name: Date of Service: Jose [name], Jose [name] 03/07/2019 2:45 PM Medical Record [id number] Patient Account Number: [id number] Date of Birth/Sex: Treating RN: 26-May-1955 (63 y.o. Hessie Diener Primary Care Adoni Greenough: Georgina Peer Other Clinician: Referring Ameria Sanjurjo: Treating Corleone Biegler/Extender:Robson, Gerilyn Nestle, Charlean Sanfilippo in Treatment: 0 Foot Assessment Items Site Locations + = Sensation present, - = Sensation absent, C = Callus, U = Ulcer R = Redness, W = Warmth, M = Maceration, PU = Pre-ulcerative lesion F = Fissure, S = Swelling, D = Dryness Assessment Right: Left: Other Deformity: No No Prior Foot Ulcer: No No Prior Amputation: No No Charcot Joint: No No Ambulatory Status: Ambulatory Without Help Gait: Steady Electronic  Signature(s) Signed: 03/07/2019 5:14:15 PM By: Deon Pilling Entered By: Deon Pilling on 03/07/2019 16:40:06 -------------------------------------------------------------------------------- Nutrition Risk Screening Details Patient Name: Date of Service: Jose [name], Jose [name] 03/07/2019 2:45 PM Medical Record [id number] Patient Account Number: [id number] Date of Birth/Sex: Treating RN: [birth year] (63 y.o. Hessie Diener Primary Care Tagan Bartram: Georgina Peer Other Clinician: Referring Riyad Keena: Treating Fran Neiswonger/Extender:Robson, Gerilyn Nestle, Charlean Sanfilippo in Treatment: 0 Height (in): 67 Weight (lbs): 240 Body Mass Index (BMI): 37.6 Nutrition Risk Screening Items Score Screening NUTRITION RISK SCREEN: I have an illness or condition that made me change the kind and/or 2 Yes amount of food I eat I eat fewer than two meals per day 0 No I eat few fruits and vegetables, or milk products 0 No I have three or more drinks of beer, liquor or wine almost every day 0 No I have tooth or mouth problems that make it hard for me to eat 0 No I don't always have enough money to buy the food I need 0 No I eat alone most of the time 0 No I take three or more different prescribed or over-the-counter drugs a day 1 Yes 0 No Without wanting to, I have lost or gained 10 pounds in the last six months I am not always physically able to shop, cook and/or feed myself 0 No Nutrition Protocols Good Risk Protocol Provide education on Moderate Risk Protocol 0 nutrition High Risk Proctocol Risk Level: Moderate Risk Score: 3 Electronic Signature(s) Signed: 03/07/2019 5:14:15 PM By: Deon Pilling Entered By: Deon Pilling on 03/07/2019 16:35:45

## 2019-03-08 ENCOUNTER — Other Ambulatory Visit (HOSPITAL_COMMUNITY)
Admission: RE | Admit: 2019-03-08 | Discharge: 2019-03-08 | Disposition: A | Discharge status: Home / Self Care | Payer: BC Managed Care – PPO | Source: Other Acute Inpatient Hospital | Attending: Internal Medicine | Admitting: Internal Medicine

## 2019-03-08 DIAGNOSIS — L97113 Non-pressure chronic ulcer of right thigh with necrosis of muscle: Secondary | ICD-10-CM | POA: Diagnosis present

## 2019-03-08 NOTE — Progress Notes (Addendum)
Jose [name], Jose [name] ([id number]) Visit Report for 03/07/2019 Allergy List Details Patient Name: Date of Service: Jose [name], Jose 03/07/2019 2:45 PM Medical Record [id number] Patient Account Number: [id number] Date of Birth/Sex: Treating [name]: 01/10/1956 (63 y.o. Male) Jose [name] Primary Care Shawnelle Spoerl: Jose BabinskiFOWLER, [name] Other Clinician: Referring Jose [name]: Treating Jose [name]:Jose [name] Jose [name]: 0 Allergies Active Allergies No Known Allergies Allergy Notes Electronic Signature(s) Signed: 03/07/2019 6:13:08 PM By: Jose DeedBoehlein, Linda [name], BSN Entered By: Jose [name] on 03/07/2019 15:22:36 -------------------------------------------------------------------------------- Arrival Information Details Patient Name: Date of Service: Jose [name], Jose 03/07/2019 2:45 PM Medical Record [id number] Patient Account Number: [id number] Date of Birth/Sex: Treating [name]: 04/04/1955 (63 y.o. Male) Jose [name] Primary Care Bianca Vester: Jose BabinskiFOWLER, [name] Other Clinician: Referring Jonel Weldon: Treating Nicola Quesnell/Extender:Jose [name] Jose [name]: 0 Visit Information Patient Arrived: Ambulatory Arrival Time: 16:19 Accompanied By: self Transfer Assistance: None Patient Identification Verified: Yes Secondary Verification Process Yes Completed: Patient Requires Transmission-Based No Precautions: Patient Has Alerts: Yes Patient Alerts: Patient on Blood Thinner Electronic Signature(s) Signed: 03/07/2019 5:14:15 PM By: Jose [name] Entered By: Jose [name] on 03/07/2019 16:19:30 -------------------------------------------------------------------------------- Clinic Level of Care Assessment Details Patient Name: Date of Service: Jose [name], [name] 03/07/2019 2:45 PM Medical Record [id number] Patient Account Number: [id number] Date of Birth/Sex: Treating [name]: 09/18/1955 (63 y.o. Male) Jose [name] Primary Care Benita Boonstra: Jose BabinskiFOWLER,  [name] Other Clinician: Referring Reiana Poteet: Treating Alitzel Cookson/Extender:Jose [name] Jose [name]: 0 Clinic Level of Care Assessment Items TOOL 1 Quantity Score X - Use when EandM and Procedure is performed on INITIAL visit 1 0 ASSESSMENTS - Nursing Assessment / Reassessment X - General Physical Exam (combine w/ comprehensive assessment (listed just below) 1 20 when performed on new pt. evals) X - Comprehensive Assessment (HX, ROS, Risk Assessments, Wounds Hx, etc.) 1 25 ASSESSMENTS - Wound and Skin Assessment / Reassessment []  - Dermatologic / Skin Assessment (not related to wound area) 0 ASSESSMENTS - Ostomy and/or Continence Assessment and Care []  - Incontinence Assessment and Management 0 []  - Ostomy Care Assessment and Management (repouching, etc.) 0 PROCESS - Coordination of Care X - Simple Patient / Family Education for ongoing care 1 15 []  - Complex (extensive) Patient / Family Education for ongoing care 0 X - Staff obtains ChiropractorConsents, Records, Test Results / Process Orders 1 10 []  - Staff telephones HHA, Nursing Homes / Clarify orders / etc 0 []  - Routine Transfer to another Facility (non-emergent condition) 0 []  - Routine Hospital Admission (non-emergent condition) 0 X - New Admissions / Manufacturing engineernsurance Authorizations / Ordering NPWT, Apligraf, etc. 1 15 []  - Emergency Hospital Admission (emergent condition) 0 PROCESS - Special Needs []  - Pediatric / Minor Patient Management 0 []  - Isolation Patient Management 0 []  - Hearing / Language / Visual special needs 0 []  - Assessment of Community assistance (transportation, D/C planning, etc.) 0 []  - Additional assistance / Altered mentation 0 []  - Support Surface(s) Assessment (bed, cushion, seat, etc.) 0 INTERVENTIONS - Miscellaneous []  - External ear exam 0 []  - Patient Transfer (multiple staff / Nurse, adultHoyer Lift / Similar devices) 0 []  - Simple Staple / Suture removal (25 or less) 0 []  - Complex Staple / Suture  removal (26 or more) 0 []  - Hypo/Hyperglycemic Management (do not check if billed separately) 0 X - Ankle / Brachial Index (ABI) - do not check if billed separately 1 15 Has the patient been seen at the hospital within the last three years: Yes Total Score: 100 Level Of Care: New/Established - Level 3 Electronic Signature(s) Signed: 03/08/2019  11:01:46 AM By: Jose [name] Entered By: Jose [name] on 03/07/2019 18:14:13 -------------------------------------------------------------------------------- Encounter Discharge Information Details Patient Name: Date of Service: Jose [name], Jose [name] 03/07/2019 2:45 PM Medical Record [id number] Patient Account Number: [id number] Date of Birth/Sex: Treating [name]: 1956-01-30 (63 y.o. Male) Kela Millin Primary Care Zohra Clavel: Georgina Peer Other Clinician: Referring Giorgio Chabot: Treating Haya Hemler/Extender:Robson, Gerilyn Nestle, Charlean Sanfilippo in [name]: 0 Encounter Discharge Information Items Discharge Condition: Stable Ambulatory Status: Ambulatory Discharge Destination: Home Transportation: Private Auto Accompanied By: self Schedule Follow-up Appointment: Yes Clinical Summary of Care: Patient Declined Electronic Signature(s) Signed: 03/08/2019 4:54:21 PM By: Kela Millin Entered By: Kela Millin on 03/07/2019 17:47:48 -------------------------------------------------------------------------------- Lower Extremity Assessment Details Patient Name: Date of Service: Jose [name], Jose [name] 03/07/2019 2:45 PM Medical Record [id number] Patient Account Number: [id number] Date of Birth/Sex: Treating [name]: [birth year] (63 y.o. Male) Jose [name] Primary Care Sam Overbeck: Georgina Peer Other Clinician: Referring Vernita Tague: Treating Maxwell Lemen/Extender:Robson, Gerilyn Nestle, [name] Jose [name]: 0 Notes No BLE wounds. Electronic Signature(s) Signed: 03/07/2019 5:14:15 PM By: Jose [name] Entered By: Jose [name] on 03/07/2019  16:40:59 -------------------------------------------------------------------------------- Multi Wound Chart Details Patient Name: Date of Service: Jose [name], Jose [name] 03/07/2019 2:45 PM Medical Record [id number] Patient Account Number: [id number] Date of Birth/Sex: Treating [name]: [birth year] (63 y.o. Male) Primary Care Jaydn Fincher: Georgina Peer Other Clinician: Referring Taronda Comacho: Treating Lorece Keach/Extender:Robson, Gerilyn Nestle, Charlean Sanfilippo in [name]: 0 Vital Signs Height(in): 64 Pulse(bpm): 94 Weight(lbs): 240 Blood Pressure(mmHg): 126/66 Body Mass Index(BMI): 38 Temperature(F): 98.3 Respiratory 20 Rate(breaths/min): Photos: [1:No Photos] [N/A:N/A] Wound Location: [1:Left Upper Leg - Medial] [N/A:N/A] Wounding Event: [1:Surgical Injury] [N/A:N/A] Primary Etiology: [1:Open Surgical Wound] [N/A:N/A] Comorbid History: [1:Hemophilia, Chronic Obstructive Pulmonary Disease (COPD), Sleep Apnea, Congestive Heart Failure, Coronary Artery Disease, Hypertension, Myocardial Infarction, Peripheral Arterial Disease, Peripheral Venous Disease, Vasculitis,  Neuropathy] [N/A:N/A] Date Acquired: [1:09/13/2017] [N/A:N/A N/A] Jose of [name]: [1:0] [N/A:N/A N/A] Wound Status: [1:Open] [N/A:N/A N/A] Measurements L x W x D 2.4x3x3 [N/A:N/A N/A] (cm) Area (cm) : [1:5.655] [N/A:N/A N/A] Volume (cm) : [1:16.965] [N/A:N/A N/A] Starting Position 1 [1:12] (o'clock): Ending Position 1 [1:12] (o'clock): Maximum Distance 1 [1:5] (cm): Undermining: [1:Yes] [N/A:N/A N/A] Classification: [1:Unclassifiable] [N/A:N/A N/A] Exudate Amount: [1:Large] [N/A:N/A N/A] Exudate Type: [1:Serous] [N/A:N/A N/A] Exudate Color: [1:amber] [N/A:N/A N/A] Wound Margin: [1:Distinct, outline attached N/A] [N/A:N/A] Granulation Amount: [1:None Present (0%)] [N/A:N/A N/A] Necrotic Amount: [1:Large (67-100%)] [N/A:N/A N/A] Necrotic Tissue: [1:Eschar, Adherent Slough N/A] [N/A:N/A] Exposed Structures: [1:Fascia: No  Fat Layer (Subcutaneous Tissue) Exposed: No Tendon: No Muscle: No Joint: No Bone: No] [N/A:N/A N/A] Epithelialization: [1:None] [N/A:N/A N/A] Debridement: [1:Debridement - Excisional N/A] [N/A:N/A] Pre-procedure [1:17:17] [N/A:N/A N/A] Verification/Time Out Taken: Pain Control: [1:Other] [N/A:N/A N/A] Tissue Debrided: [1:Subcutaneous, Slough] [N/A:N/A N/A] Level: [1:Skin/Subcutaneous Tissue N/A] [N/A:N/A] Debridement Area (sq cm):7.2 [N/A:N/A N/A] Instrument: [1:Curette] [N/A:N/A N/A] Bleeding: [1:Moderate] [N/A:N/A N/A] Hemostasis Achieved: [1:Pressure] [N/A:N/A N/A] Procedural Pain: [1:0] [N/A:N/A N/A] Post Procedural Pain: [1:0] [N/A:N/A N/A] Debridement [name] Procedure was tolerated [N/A:N/A N/A] Response: [1:well] Post Debridement [1:2.4x3x3] [N/A:N/A N/A] Measurements L x W x D (cm) Post Debridement [1:16.965] [N/A:N/A N/A] Volume: (cm) Procedures Performed: Debridement [N/A:N/A N/A] [name] Notes Wound #1 (Left, Medial Upper Leg) 1. Cleanse With Wound Cleanser 2. Periwound Care Skin Prep 3. Primary Dressing Applied Dry Gauze 4. Secondary Dressing ABD Pad Kerramax/Xtrasorb Notes ace wrap. Electronic Signature(s) Signed: 03/14/2019 2:25:28 PM By: Linton Ham MD Entered By: Linton Ham on 03/09/2019 11:54:32 -------------------------------------------------------------------------------- Multi-Disciplinary Care Plan Details Patient Name: Date of Service: Jose [name], Jose [name] 03/07/2019 2:45 PM Medical Record [id number] Patient Account Number: [id number] Date of Birth/Sex: Treating [name]: 20-Aug-1955 (  63 y.o. Male) Epps, Lyla Son Primary Care Hawley Pavia: Jose [name] Other Clinician: Referring Vaanya Shambaugh: Treating Earnstine Meinders/Extender:Robson, Lars Mage, Regis Bill in [name]: 0 Active Inactive Wound/Skin Impairment Nursing Diagnoses: Knowledge deficit related to ulceration/compromised skin integrity Goals: Patient/caregiver will verbalize  understanding of skin care regimen Date Initiated: 03/07/2019 Target Resolution Date: 04/07/2019 Goal Status: Active Ulcer/skin breakdown will have a volume reduction of 30% by week 4 Date Initiated: 03/07/2019 Target Resolution Date: 04/07/2019 Goal Status: Active Interventions: Assess patient/caregiver ability to obtain necessary supplies Assess patient/caregiver ability to perform ulcer/skin care regimen upon admission and as needed Assess ulceration(s) every visit Notes: Electronic Signature(s) Signed: 03/08/2019 11:01:46 AM By: Jose Pax [name] Entered By: Jose Pax on 03/07/2019 17:19:24 -------------------------------------------------------------------------------- Pain Assessment Details Patient Name: Date of Service: Jose [name], Jose [name] 03/07/2019 2:45 PM Medical Record [id number] Patient Account Number: [id number] Date of Birth/Sex: Treating [name]: 05/05/1955 (63 y.o. Male) Jose Stall Primary Care Daneil Beem: Jose [name] Other Clinician: Referring Javonne Louissaint: Treating Colten Desroches/Extender:Robson, Lars Mage, Regis Bill in [name]: 0 Active Problems Location of Pain Severity and Description of Pain Patient Has Paino Yes Site Locations Pain Location: Generalized Pain, Pain in Ulcers Duration of the Pain. Constant / Intermittento Intermittent Rate the pain. Current Pain Level: 3 Pain Management and Medication Current Pain Management: Medication: No Cold Application: No Rest: No Massage: No Activity: No T.E.N.S.: No Heat Application: No Leg drop or elevation: No Is the Current Pain Management Adequate: Adequate How does your wound impact your activities of daily livingo Sleep: No Bathing: No Appetite: No Relationship With Others: No Bladder Continence: No Emotions: No Bowel Continence: No Work: No Toileting: No Drive: No Dressing: No Hobbies: No Notes back 8/10. left thigh 3/10. neuropathy 4/10. Electronic Signature(s) Signed: 03/07/2019  5:14:15 PM By: Jose Stall Entered By: Jose Stall on 03/07/2019 16:36:34 -------------------------------------------------------------------------------- Patient/Caregiver Education Details Patient Name: Jose [name], Kengo 12/22/2020andnbsp2:45 Date of Service: PM Medical Record [id number] Number: Patient Account Number: [id number] Treating [name]: Date of Birth/Gender: 06-30-1955 (63 y.o. Jose Pax Male) Other Clinician: Primary Care Physician: Jose [name] Treating Baltazar Najjar Referring Physician: Physician/Extender: Milagros Reap in [name]: 0 Education Assessment Education Provided To: Patient Education Topics Provided Pain: Methods: Explain/Verbal Responses: State content correctly Wound/Skin Impairment: Methods: Explain/Verbal Responses: State content correctly Electronic Signature(s) Signed: 03/08/2019 11:01:46 AM By: Jose Pax [name] Entered By: Jose Pax on 03/07/2019 17:19:38 -------------------------------------------------------------------------------- Wound Assessment Details Patient Name: Date of Service: Jose [name], Jose [name] 03/07/2019 2:45 PM Medical Record [id number] Patient Account Number: [id number] Date of Birth/Sex: Treating [name]: January 06, 1956 (63 y.o. Male) Jose Stall Primary Care Delinda Malan: Jose [name] Other Clinician: Referring Waynetta Metheny: Treating Adalynd Donahoe/Extender:Robson, Lars Mage, Regis Bill in [name]: 0 Wound Status Wound Number: 1 Primary Open Surgical Wound Etiology: Wound Location: Left Upper Leg - Medial Wound Open Wounding Event: Surgical Injury Status: Status: Date Acquired: 09/13/2017 Comorbid Hemophilia, Chronic Obstructive Pulmonary Jose Of [name]: 0 History: Disease (COPD), Sleep Apnea, Congestive Clustered Wound: No Heart Failure, Coronary Artery Disease, Hypertension, Myocardial Infarction, Peripheral Arterial Disease, Peripheral Venous Disease, Vasculitis, Neuropathy Photos Wound  Measurements Length: (cm) 2.4 % Reduction in Width: (cm) 3 % Reduction in Depth: (cm) 3 Epithelializat Area: (cm) 5.655 Tunneling: Volume: (cm) 16.965 Undermining: Starting Po Ending Posi Maximum Dis Area: 0% Volume: 0% ion: None No Yes sition (o'clock): 12 tion (o'clock): 12 tance: (cm) 5 Wound Description Classification: Unclassifiable Foul Odor Afte Wound Margin: Distinct, outline attached Slough/Fibrino Exudate Amount: Large Exudate Type: Serous Exudate Color: amber Wound Bed Granulation Amount: None Present (0%) Necrotic Amount: Large (67-100%) Fascia Exposed Necrotic Quality: Eschar,  Adherent Slough Fat Layer (Sub Tendon Exposed Muscle Exposed Joint Exposed: Bone Exposed: r Cleansing: No Yes Exposed Structure : No cutaneous Tissue) Exposed: No : No : No No No [name] Notes Wound #1 (Left, Medial Upper Leg) 1. Cleanse With Wound Cleanser 2. Periwound Care Skin Prep 3. Primary Dressing Applied Dry Gauze 4. Secondary Dressing ABD Pad Kerramax/Xtrasorb Notes ace wrap. Electronic Signature(s) Signed: 03/13/2019 4:06:01 PM By: Benjaman Kindler EMT/HBOT Signed: 03/13/2019 5:44:52 PM By: Jose Stall Previous Signature: 03/07/2019 5:14:15 PM Version By: Jose Stall Entered By: Benjaman Kindler on 03/13/2019 13:57:31 -------------------------------------------------------------------------------- Vitals Details Patient Name: Date of Service: Jose [name], Jose [name] 03/07/2019 2:45 PM Medical Record [id number] Patient Account Number: [id number] Date of Birth/Sex: Treating [name]: 1955-10-24 (63 y.o. Male) Elesa Hacker, [place] Primary Care Gredmarie Delange: Jose [name] Other Clinician: Referring Avelina Mcclurkin: Treating Berel Najjar/Extender:Robson, Lars Mage, Regis Bill in [name]: 0 Vital Signs Time Taken: 16:20 Temperature (F): 98.3 Height (in): 67 Pulse (bpm): 79 Source: Stated Respiratory Rate (breaths/min): 20 Weight (lbs): 240 Blood Pressure (mmHg):  126/66 Source: Stated Reference Range: 80 - 120 mg / dl Body Mass Index (BMI): 37.6 Electronic Signature(s) Signed: 03/07/2019 5:14:15 PM By: Jose Stall Entered By: Jose Stall on 03/07/2019 16:34:21

## 2019-03-11 LAB — AEROBIC CULTURE W GRAM STAIN (SUPERFICIAL SPECIMEN): Gram Stain: NONE SEEN

## 2019-03-13 ENCOUNTER — Inpatient Hospital Stay (HOSPITAL_COMMUNITY)
Admission: EM | Admit: 2019-03-13 | Discharge: 2019-03-22 | DRG: 908 | Disposition: A | Discharge status: Home Health | Payer: BC Managed Care – PPO | Attending: Internal Medicine | Admitting: Internal Medicine

## 2019-03-13 ENCOUNTER — Other Ambulatory Visit: Payer: Self-pay

## 2019-03-13 ENCOUNTER — Encounter (HOSPITAL_COMMUNITY): Payer: Self-pay | Admitting: Emergency Medicine

## 2019-03-13 DIAGNOSIS — Z539 Procedure and treatment not carried out, unspecified reason: Secondary | ICD-10-CM | POA: Diagnosis not present

## 2019-03-13 DIAGNOSIS — D6861 Antiphospholipid syndrome: Secondary | ICD-10-CM | POA: Diagnosis present

## 2019-03-13 DIAGNOSIS — Z79899 Other long term (current) drug therapy: Secondary | ICD-10-CM

## 2019-03-13 DIAGNOSIS — I110 Hypertensive heart disease with heart failure: Secondary | ICD-10-CM | POA: Diagnosis present

## 2019-03-13 DIAGNOSIS — Z9861 Coronary angioplasty status: Secondary | ICD-10-CM

## 2019-03-13 DIAGNOSIS — I509 Heart failure, unspecified: Secondary | ICD-10-CM | POA: Diagnosis present

## 2019-03-13 DIAGNOSIS — Z7901 Long term (current) use of anticoagulants: Secondary | ICD-10-CM

## 2019-03-13 DIAGNOSIS — Z7982 Long term (current) use of aspirin: Secondary | ICD-10-CM

## 2019-03-13 DIAGNOSIS — B957 Other staphylococcus as the cause of diseases classified elsewhere: Secondary | ICD-10-CM | POA: Diagnosis present

## 2019-03-13 DIAGNOSIS — E1140 Type 2 diabetes mellitus with diabetic neuropathy, unspecified: Secondary | ICD-10-CM | POA: Diagnosis present

## 2019-03-13 DIAGNOSIS — E1169 Type 2 diabetes mellitus with other specified complication: Secondary | ICD-10-CM

## 2019-03-13 DIAGNOSIS — B964 Proteus (mirabilis) (morganii) as the cause of diseases classified elsewhere: Secondary | ICD-10-CM | POA: Diagnosis present

## 2019-03-13 DIAGNOSIS — T8189XA Other complications of procedures, not elsewhere classified, initial encounter: Secondary | ICD-10-CM | POA: Diagnosis present

## 2019-03-13 DIAGNOSIS — L7682 Other postprocedural complications of skin and subcutaneous tissue: Principal | ICD-10-CM | POA: Diagnosis present

## 2019-03-13 DIAGNOSIS — L0291 Cutaneous abscess, unspecified: Secondary | ICD-10-CM | POA: Diagnosis present

## 2019-03-13 DIAGNOSIS — Z955 Presence of coronary angioplasty implant and graft: Secondary | ICD-10-CM

## 2019-03-13 DIAGNOSIS — E119 Type 2 diabetes mellitus without complications: Secondary | ICD-10-CM

## 2019-03-13 DIAGNOSIS — L02416 Cutaneous abscess of left lower limb: Secondary | ICD-10-CM | POA: Diagnosis not present

## 2019-03-13 DIAGNOSIS — N179 Acute kidney failure, unspecified: Secondary | ICD-10-CM | POA: Diagnosis not present

## 2019-03-13 DIAGNOSIS — G4730 Sleep apnea, unspecified: Secondary | ICD-10-CM | POA: Diagnosis present

## 2019-03-13 DIAGNOSIS — I10 Essential (primary) hypertension: Secondary | ICD-10-CM | POA: Diagnosis present

## 2019-03-13 DIAGNOSIS — L03116 Cellulitis of left lower limb: Secondary | ICD-10-CM | POA: Diagnosis present

## 2019-03-13 DIAGNOSIS — Z6836 Body mass index (BMI) 36.0-36.9, adult: Secondary | ICD-10-CM

## 2019-03-13 DIAGNOSIS — I2510 Atherosclerotic heart disease of native coronary artery without angina pectoris: Secondary | ICD-10-CM

## 2019-03-13 DIAGNOSIS — G4733 Obstructive sleep apnea (adult) (pediatric): Secondary | ICD-10-CM | POA: Diagnosis present

## 2019-03-13 DIAGNOSIS — Z20822 Contact with and (suspected) exposure to covid-19: Secondary | ICD-10-CM | POA: Diagnosis present

## 2019-03-13 DIAGNOSIS — Z8249 Family history of ischemic heart disease and other diseases of the circulatory system: Secondary | ICD-10-CM

## 2019-03-13 DIAGNOSIS — J449 Chronic obstructive pulmonary disease, unspecified: Secondary | ICD-10-CM | POA: Diagnosis present

## 2019-03-13 DIAGNOSIS — Z951 Presence of aortocoronary bypass graft: Secondary | ICD-10-CM

## 2019-03-13 DIAGNOSIS — J9610 Chronic respiratory failure, unspecified whether with hypoxia or hypercapnia: Secondary | ICD-10-CM | POA: Diagnosis present

## 2019-03-13 DIAGNOSIS — Z86718 Personal history of other venous thrombosis and embolism: Secondary | ICD-10-CM

## 2019-03-13 DIAGNOSIS — F17210 Nicotine dependence, cigarettes, uncomplicated: Secondary | ICD-10-CM | POA: Diagnosis present

## 2019-03-13 LAB — COMPREHENSIVE METABOLIC PANEL
ALT: 24 U/L (ref 0–44)
AST: 26 U/L (ref 15–41)
Albumin: 3.2 g/dL — ABNORMAL LOW (ref 3.5–5.0)
Alkaline Phosphatase: 52 U/L (ref 38–126)
Anion gap: 12 (ref 5–15)
BUN: 15 mg/dL (ref 8–23)
CO2: 24 mmol/L (ref 22–32)
Calcium: 9.2 mg/dL (ref 8.9–10.3)
Chloride: 104 mmol/L (ref 98–111)
Creatinine, Ser: 0.86 mg/dL (ref 0.61–1.24)
GFR calc Af Amer: >60 mL/min (ref >60)
GFR calc non Af Amer: >60 mL/min (ref >60)
Glucose, Bld: 113 mg/dL — ABNORMAL HIGH (ref 70–99)
Potassium: 3.8 mmol/L (ref 3.5–5.1)
Sodium: 140 mmol/L (ref 135–145)
Total Bilirubin: 0.2 mg/dL — ABNORMAL LOW (ref 0.3–1.2)
Total Protein: 6.7 g/dL (ref 6.5–8.1)

## 2019-03-13 LAB — CBC WITH DIFFERENTIAL/PLATELET
Abs Immature Granulocytes: 0.05 10*3/uL (ref 0.00–0.07)
Basophils Absolute: 0.1 10*3/uL (ref 0.0–0.1)
Basophils Relative: 1 %
Eosinophils Absolute: 0.2 10*3/uL (ref 0.0–0.5)
Eosinophils Relative: 2 %
HCT: 39.6 % (ref 39.0–52.0)
Hemoglobin: 13 g/dL (ref 13.0–17.0)
Immature Granulocytes: 1 %
Lymphocytes Relative: 27 %
Lymphs Abs: 2.7 10*3/uL (ref 0.7–4.0)
MCH: 31 pg (ref 26.0–34.0)
MCHC: 32.8 g/dL (ref 30.0–36.0)
MCV: 94.5 fL (ref 80.0–100.0)
Monocytes Absolute: 1.1 10*3/uL — ABNORMAL HIGH (ref 0.1–1.0)
Monocytes Relative: 10 %
Neutro Abs: 6.1 10*3/uL (ref 1.7–7.7)
Neutrophils Relative %: 59 %
Platelets: 353 10*3/uL (ref 150–400)
RBC: 4.19 MIL/uL — ABNORMAL LOW (ref 4.22–5.81)
RDW: 14.8 % (ref 11.5–15.5)
WBC: 10.1 10*3/uL (ref 4.0–10.5)
nRBC: 0 % (ref 0.0–0.2)

## 2019-03-13 LAB — LACTIC ACID, PLASMA: Lactic Acid, Venous: 3.6 mmol/L (ref 0.5–1.9)

## 2019-03-13 LAB — PROTIME-INR
INR: 2 — ABNORMAL HIGH (ref 0.8–1.2)
Prothrombin Time: 22.6 seconds — ABNORMAL HIGH (ref 11.4–15.2)

## 2019-03-13 NOTE — ED Triage Notes (Signed)
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 Patient stated that the abscess has started to leak, have foul odors, and drain red and white pus.

## 2019-03-14 ENCOUNTER — Emergency Department (HOSPITAL_COMMUNITY): Payer: BC Managed Care – PPO

## 2019-03-14 DIAGNOSIS — L0390 Cellulitis, unspecified: Secondary | ICD-10-CM | POA: Diagnosis not present

## 2019-03-14 DIAGNOSIS — Z8249 Family history of ischemic heart disease and other diseases of the circulatory system: Secondary | ICD-10-CM | POA: Diagnosis not present

## 2019-03-14 DIAGNOSIS — Z951 Presence of aortocoronary bypass graft: Secondary | ICD-10-CM | POA: Diagnosis not present

## 2019-03-14 DIAGNOSIS — I509 Heart failure, unspecified: Secondary | ICD-10-CM | POA: Diagnosis present

## 2019-03-14 DIAGNOSIS — B957 Other staphylococcus as the cause of diseases classified elsewhere: Secondary | ICD-10-CM | POA: Diagnosis present

## 2019-03-14 DIAGNOSIS — I350 Nonrheumatic aortic (valve) stenosis: Secondary | ICD-10-CM | POA: Diagnosis not present

## 2019-03-14 DIAGNOSIS — B964 Proteus (mirabilis) (morganii) as the cause of diseases classified elsewhere: Secondary | ICD-10-CM | POA: Diagnosis present

## 2019-03-14 DIAGNOSIS — F17210 Nicotine dependence, cigarettes, uncomplicated: Secondary | ICD-10-CM | POA: Diagnosis present

## 2019-03-14 DIAGNOSIS — Z6836 Body mass index (BMI) 36.0-36.9, adult: Secondary | ICD-10-CM | POA: Diagnosis not present

## 2019-03-14 DIAGNOSIS — Z20822 Contact with and (suspected) exposure to covid-19: Secondary | ICD-10-CM | POA: Diagnosis present

## 2019-03-14 DIAGNOSIS — L7682 Other postprocedural complications of skin and subcutaneous tissue: Secondary | ICD-10-CM | POA: Diagnosis present

## 2019-03-14 DIAGNOSIS — L0291 Cutaneous abscess, unspecified: Secondary | ICD-10-CM | POA: Diagnosis not present

## 2019-03-14 DIAGNOSIS — J438 Other emphysema: Secondary | ICD-10-CM | POA: Diagnosis not present

## 2019-03-14 DIAGNOSIS — I2510 Atherosclerotic heart disease of native coronary artery without angina pectoris: Secondary | ICD-10-CM | POA: Diagnosis present

## 2019-03-14 DIAGNOSIS — Z955 Presence of coronary angioplasty implant and graft: Secondary | ICD-10-CM | POA: Diagnosis not present

## 2019-03-14 DIAGNOSIS — G4733 Obstructive sleep apnea (adult) (pediatric): Secondary | ICD-10-CM | POA: Diagnosis present

## 2019-03-14 DIAGNOSIS — I110 Hypertensive heart disease with heart failure: Secondary | ICD-10-CM | POA: Diagnosis present

## 2019-03-14 DIAGNOSIS — J9610 Chronic respiratory failure, unspecified whether with hypoxia or hypercapnia: Secondary | ICD-10-CM | POA: Diagnosis present

## 2019-03-14 DIAGNOSIS — L03116 Cellulitis of left lower limb: Secondary | ICD-10-CM | POA: Diagnosis present

## 2019-03-14 DIAGNOSIS — J449 Chronic obstructive pulmonary disease, unspecified: Secondary | ICD-10-CM | POA: Diagnosis present

## 2019-03-14 DIAGNOSIS — N179 Acute kidney failure, unspecified: Secondary | ICD-10-CM | POA: Diagnosis not present

## 2019-03-14 DIAGNOSIS — Z86718 Personal history of other venous thrombosis and embolism: Secondary | ICD-10-CM | POA: Diagnosis not present

## 2019-03-14 DIAGNOSIS — T8189XA Other complications of procedures, not elsewhere classified, initial encounter: Secondary | ICD-10-CM | POA: Diagnosis present

## 2019-03-14 DIAGNOSIS — L02416 Cutaneous abscess of left lower limb: Secondary | ICD-10-CM | POA: Diagnosis present

## 2019-03-14 DIAGNOSIS — E1140 Type 2 diabetes mellitus with diabetic neuropathy, unspecified: Secondary | ICD-10-CM | POA: Diagnosis present

## 2019-03-14 DIAGNOSIS — Z539 Procedure and treatment not carried out, unspecified reason: Secondary | ICD-10-CM | POA: Diagnosis not present

## 2019-03-14 DIAGNOSIS — D6861 Antiphospholipid syndrome: Secondary | ICD-10-CM | POA: Diagnosis present

## 2019-03-14 DIAGNOSIS — E119 Type 2 diabetes mellitus without complications: Secondary | ICD-10-CM | POA: Diagnosis not present

## 2019-03-14 DIAGNOSIS — Z7901 Long term (current) use of anticoagulants: Secondary | ICD-10-CM | POA: Diagnosis not present

## 2019-03-14 DIAGNOSIS — Z978 Presence of other specified devices: Secondary | ICD-10-CM | POA: Diagnosis not present

## 2019-03-14 LAB — PROTIME-INR
INR: 1.5 — ABNORMAL HIGH (ref 0.8–1.2)
Prothrombin Time: 18.3 seconds — ABNORMAL HIGH (ref 11.4–15.2)

## 2019-03-14 LAB — LACTIC ACID, PLASMA: Lactic Acid, Venous: 1.8 mmol/L (ref 0.5–1.9)

## 2019-03-14 LAB — CBG MONITORING, ED: Glucose-Capillary: 120 mg/dL — ABNORMAL HIGH (ref 70–99)

## 2019-03-14 LAB — HIV ANTIBODY (ROUTINE TESTING W REFLEX): HIV Screen 4th Generation wRfx: NONREACTIVE

## 2019-03-14 MED ORDER — VANCOMYCIN HCL IN DEXTROSE 1-5 GM/200ML-% IV SOLN
1000.0000 mg | Freq: Two times a day (BID) | INTRAVENOUS | Status: DC
  Administered 2019-03-14 – 2019-03-19 (×10): 1000 mg via INTRAVENOUS
  Filled 2019-03-14 (×14): qty 200

## 2019-03-14 MED ORDER — ACETAMINOPHEN 325 MG PO TABS: 650.0000 mg | ORAL_TABLET | Freq: Four times a day (QID) | ORAL | Status: DC | PRN

## 2019-03-14 MED ORDER — FENTANYL CITRATE (PF) 100 MCG/2ML IJ SOLN
100.0000 ug | Freq: Once | INTRAMUSCULAR | Status: AC
  Administered 2019-03-14: 11:00:00 100 ug via INTRAVENOUS

## 2019-03-14 MED ORDER — IOHEXOL 300 MG/ML  SOLN
100.0000 mL | Freq: Once | INTRAMUSCULAR | Status: AC | PRN
  Administered 2019-03-14: 08:00:00 100 mL via INTRAVENOUS

## 2019-03-14 MED ORDER — TORSEMIDE 20 MG PO TABS
40.0000 mg | ORAL_TABLET | Freq: Every day | ORAL | Status: DC
  Administered 2019-03-14 – 2019-03-16 (×2): 20 mg via ORAL
  Administered 2019-03-17 – 2019-03-21 (×5): 40 mg via ORAL
  Filled 2019-03-14 (×9): qty 2

## 2019-03-14 MED ORDER — HEPARIN (PORCINE) 25000 UT/250ML-% IV SOLN
1900.0000 [IU]/h | INTRAVENOUS | Status: DC
  Administered 2019-03-14: 19:00:00 1750 [IU]/h via INTRAVENOUS
  Filled 2019-03-14: qty 250

## 2019-03-14 MED ORDER — FENTANYL CITRATE (PF) 100 MCG/2ML IJ SOLN
100.0000 ug | INTRAMUSCULAR | Status: AC | PRN
  Administered 2019-03-14 – 2019-03-15 (×2): 100 ug via INTRAVENOUS
  Filled 2019-03-14 (×6): qty 2

## 2019-03-14 MED ORDER — PANTOPRAZOLE SODIUM 40 MG PO TBEC
40.0000 mg | DELAYED_RELEASE_TABLET | Freq: Two times a day (BID) | ORAL | Status: DC
  Administered 2019-03-14 – 2019-03-22 (×16): 40 mg via ORAL
  Filled 2019-03-14 (×16): qty 1

## 2019-03-14 MED ORDER — METOPROLOL TARTRATE 25 MG PO TABS: 12.5000 mg | ORAL_TABLET | Freq: Once | ORAL | Status: DC

## 2019-03-14 MED ORDER — TOPIRAMATE 25 MG PO TABS
50.0000 mg | ORAL_TABLET | Freq: Two times a day (BID) | ORAL | Status: DC
  Administered 2019-03-14 – 2019-03-22 (×16): 50 mg via ORAL
  Filled 2019-03-14 (×19): qty 2

## 2019-03-14 MED ORDER — ACETAMINOPHEN 650 MG RE SUPP: 650.0000 mg | Freq: Four times a day (QID) | RECTAL | Status: DC | PRN

## 2019-03-14 MED ORDER — MORPHINE SULFATE (PF) 4 MG/ML IV SOLN
4.0000 mg | Freq: Once | INTRAVENOUS | Status: AC
  Administered 2019-03-14: 11:00:00 4 mg via INTRAVENOUS
  Filled 2019-03-14: qty 1

## 2019-03-14 MED ORDER — ONDANSETRON HCL 4 MG/2ML IJ SOLN: 4.0000 mg | Freq: Four times a day (QID) | INTRAMUSCULAR | Status: DC | PRN

## 2019-03-14 MED ORDER — INSULIN ASPART 100 UNIT/ML ~~LOC~~ SOLN
0.0000 [IU] | Freq: Three times a day (TID) | SUBCUTANEOUS | Status: DC
  Administered 2019-03-15 – 2019-03-16 (×3): 2 [IU] via SUBCUTANEOUS
  Administered 2019-03-16: 12:00:00 3 [IU] via SUBCUTANEOUS
  Administered 2019-03-17: 08:00:00 2 [IU] via SUBCUTANEOUS
  Administered 2019-03-17: 17:00:00 3 [IU] via SUBCUTANEOUS
  Administered 2019-03-17: 12:00:00 2 [IU] via SUBCUTANEOUS
  Administered 2019-03-18 (×2): 3 [IU] via SUBCUTANEOUS
  Administered 2019-03-19 (×2): 2 [IU] via SUBCUTANEOUS
  Administered 2019-03-19: 17:00:00 3 [IU] via SUBCUTANEOUS
  Administered 2019-03-20 – 2019-03-22 (×6): 2 [IU] via SUBCUTANEOUS

## 2019-03-14 MED ORDER — INSULIN ASPART 100 UNIT/ML ~~LOC~~ SOLN
0.0000 [IU] | Freq: Every day | SUBCUTANEOUS | Status: DC
  Administered 2019-03-18: 22:00:00 4 [IU] via SUBCUTANEOUS

## 2019-03-14 MED ORDER — SENNOSIDES-DOCUSATE SODIUM 8.6-50 MG PO TABS: 1.0000 | ORAL_TABLET | Freq: Every evening | ORAL | Status: DC | PRN

## 2019-03-14 MED ORDER — VANCOMYCIN HCL 2000 MG/400ML IV SOLN
2000.0000 mg | Freq: Once | INTRAVENOUS | Status: AC
  Administered 2019-03-14: 09:00:00 2000 mg via INTRAVENOUS
  Filled 2019-03-14: qty 400

## 2019-03-14 MED ORDER — ALBUTEROL SULFATE HFA 108 (90 BASE) MCG/ACT IN AERS
2.0000 | INHALATION_SPRAY | RESPIRATORY_TRACT | Status: DC
  Administered 2019-03-14 – 2019-03-17 (×12): 2 via RESPIRATORY_TRACT
  Filled 2019-03-14 (×3): qty 6.7

## 2019-03-14 MED ORDER — ONDANSETRON HCL 4 MG PO TABS: 4.0000 mg | ORAL_TABLET | Freq: Four times a day (QID) | ORAL | Status: DC | PRN

## 2019-03-14 NOTE — ED Notes (Signed)
Pt placed on 2 liters O2 n/c which he uses at home as needed

## 2019-03-14 NOTE — ED Provider Notes (Signed)
[facility] EMERGENCY DEPARTMENT Provider Note   CSN: [id number] Arrival date & time: 03/13/19  1635     History Chief Complaint  Patient presents with  . Abscess    Jose [name] is a 63 y.o. male with a history of antiphospholipid antibody syndrome on coumadin (INR 2-3), COPD, diabetes mellitus type 2, HTN who presents to the emergency department with a chief complaint of left thigh wound.  Wound history: The patient reports that he underwent a CABG with venous graft from the left leg approximately 2 and half years ago.  He reports that his cardiac rehab was complicated by the fact that he developed a seroma from where the vein was grafted in the left leg.  He underwent needle aspiration of the area by IR.  He reports minimal improvement from this procedure.  States that he later followed up with an orthopedic surgeon at [facility] in [place] who advised him to observe the area for a year to see if it would spontaneously resolve.  After the seroma did not resolve, he reports that he had a drain placed in the area for approximately 2 weeks.  Reports that the drain was placed approximately 6 months ago.  He saw minimal improvement after the drain was removed.  Reports that the surgeon had also recommended a wound VAC, but when he followed up he was told that they were unable to complete this procedure and he would need to see a general surgeon.  General surgery was consulted, but he was unable to get an appointment for several months.  Reports that he was then referred to wound care and had his first appointment last week.  During this appointment, a culture was taken from the wound.  He received a call a few days later that the culture grew Staphylococcus lugdunensis.  The wound care physician called him in a course of doxycycline.  The patient reports that he has not yet started this medication.   He presents to the ER for worsening pain and foul-smelling drainage for the  wound that has worsened over the last week.  He reports that his thigh was significantly swollen from the knee all the way up to the groin over the last week, but he reports that this is since resolved.  Reports that he has been having numerous dressing changes due to the amount of drainage from the wound.  He is having severe pain in the area.  No fever or chills, left hip or knee pain, groin pain or swelling, red streaking, chest pain, shortness of breath, or cough.  States that he came to the ER "to get this resolved quickly with a team of surgeons and IV antibiotics."  He is on Coumadin, but reports that he has not taken the medication since yesterday since he has been in the ER.  The history is provided by the patient. No language interpreter was used.       Past Medical History:  Diagnosis Date  . Antiphospholipid antibody syndrome (HCC)   . CAD S/P percutaneous coronary angioplasty '98, '04, Feb 2017   [facility]  . Carotid arterial disease (HCC) 10/2015   moderate bilateral  . COPD (chronic obstructive pulmonary disease) (HCC)   . Coronary artery disease   . Hypertension   . Non-insulin treated type 2 diabetes mellitus (HCC)   . Pulmonary emboli (HCC) 2011  . RBBB   . Sleep apnea     Patient Active Problem List   Diagnosis Date Noted  .  Coronary atherosclerosis 10/04/2017  . GERD (gastroesophageal reflux disease) 10/04/2017  . Hypertension 10/04/2017  . Pulmonary embolism (HCC) 10/04/2017  . Transient visual loss 10/04/2017  . 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 Nonrheumatic aortic valve stenosis 03/31/2017  . Pericarditis, Acute 02/12/2017  . Acute on chronic right heart failure (HCC) 02/11/2017  . Angina at rest [facility]) 08/17/2016  . Unstable angina (HCC) 08/16/2016  . Tobacco abuse 08/16/2016  . Anti-phospholipid syndrome (HCC) 08/16/2016  . Hyperlipidemia 08/16/2016  . Essential hypertension, benign 08/16/2016  . COPD (chronic obstructive pulmonary disease) (HCC) 08/16/2016  .  Sleep apnea 08/16/2016  . CAD S/P multiple PCIs 08/16/2016  . History of pulmonary embolus (PE)-2011 08/16/2016  . Chronic anticoagulation-Couamdin 08/16/2016  . Non-insulin treated type 2 diabetes mellitus (HCC) 08/16/2016  . Carotid artery disease (HCC) 08/16/2016  . RBBB (right bundle branch block) 08/16/2016  . Dyspnea on exertion 10/17/2015  . ED (erectile dysfunction) 08/09/2012    Past Surgical History:  Procedure Laterality Date  . APPENDECTOMY    . BACK SURGERY  '89, '06  . COLONOSCOPY    . CORONARY ANGIOPLASTY WITH STENT PLACEMENT  '98, '04, Feb 2017       Family History  Problem Relation Age of Onset  . CAD Father 50    Social History   Tobacco Use  . Smoking status: Former Smoker    Packs/day: 0.50    Types: Cigarettes  . Smokeless tobacco: Never Used  . Tobacco comment: has started smoking again, willing to quit.  Substance Use Topics  . Alcohol use: No  . Drug use: No    Home Medications Prior to Admission medications   Medication Sig Start Date End Date Taking? Authorizing Provider  albuterol (PROVENTIL) (5 MG/ML) 0.5% nebulizer solution Take 0.5 mLs (2.5 mg total) by nebulization every 6 (six) hours as needed for wheezing or shortness of breath. 12/27/18   Terald Sleeper, MD  ALPRAZolam Prudy Feeler) 0.5 MG tablet TAKE ONE TABLET (0.5 MG TOTAL) BY MOUTH 3 (THREE) TIMES A DAY AS NEEDED FOR SLEEP OR ANXIETY. 09/13/17   [provider]  aspirin EC 81 MG tablet Take 81 mg by mouth daily.    [provider]  Blood Glucose Monitoring Suppl (GLUCOCOM BLOOD GLUCOSE MONITOR) DEVI 1 each by XX route as directed (For Blood Glucose Monitoring) 08/23/17   [provider]  colchicine 0.6 MG tablet Take 1 tablet (0.6 mg total) by mouth 2 (two) times daily. Patient not taking: Reported on 10/04/2017 02/12/17   Shon Hale, MD  furosemide (LASIX) 40 MG tablet Take 1 tablet (40 mg total) by mouth daily. Patient not taking: Reported on 10/04/2017  02/12/17   Shon Hale, MD  isosorbide mononitrate (IMDUR) 60 MG 24 hr tablet Take 1 tablet (60 mg total) by mouth daily. Patient not taking: Reported on 10/04/2017 02/13/17   Shon Hale, MD  methylPREDNISolone (MEDROL DOSEPAK) 4 MG TBPK tablet Use as directed on package 12/27/18   Terald Sleeper, MD  metoprolol succinate (TOPROL-XL) 100 MG 24 hr tablet Take 100 mg by mouth daily. 08/04/16   [provider]  metoprolol tartrate (LOPRESSOR) 12.5 mg TABS tablet Take by mouth. 08/22/17 08/22/18  [provider]  nitroGLYCERIN (NITROSTAT) 0.4 MG SL tablet Place 0.4 mg under the tongue every 5 (five) minutes as needed for chest pain.  07/07/16   [provider]  pantoprazole (PROTONIX) 40 MG tablet Take 40 mg by mouth 2 (two) times daily. 06/03/16   [provider]  polyethylene glycol (MIRALAX / GLYCOLAX) packet Take 17 g by mouth daily. Patient not taking: Reported on 10/04/2017 02/12/17   Roxan Hockey, MD  potassium chloride (MICRO-K) 10 MEQ CR capsule Take by mouth. 08/22/17 08/22/18  [provider]  PROAIR HFA 108 (90 Base) MCG/ACT inhaler Inhale 1-2 puffs into the lungs every 4 (four) hours as needed for wheezing.  07/07/16   [provider]  rosuvastatin (CRESTOR) 10 MG tablet Take 10 mg by mouth every evening. 07/07/16   [provider]  tamsulosin (FLOMAX) 0.4 MG CAPS capsule Take 0.4 mg by mouth daily. 06/05/16   [provider]  Tiotropium Bromide-Olodaterol (STIOLTO RESPIMAT) 2.5-2.5 MCG/ACT AERS Inhale into the lungs. 09/10/17   [provider]  topiramate (TOPAMAX) 50 MG tablet Take 50 mg by mouth 2 (two) times daily. 06/05/16   [provider]  torsemide (DEMADEX) 20 MG tablet Take by mouth. 07/05/17 07/05/18  [provider]  warfarin (COUMADIN) 5 MG tablet Take 5-7.5 mg by mouth See admin instructions. Take 7.5 mg on Monday, Wednesday and Friday then take 5 mg all the other days 07/17/16    [provider]    Allergies    Melatonin and Ramipril  Review of Systems   Review of Systems  Constitutional: Negative for appetite change, chills and fever.  HENT: Negative for congestion and sore throat.   Respiratory: Negative for shortness of breath.   Cardiovascular: Negative for chest pain.  Gastrointestinal: Negative for abdominal pain, diarrhea, nausea and vomiting.  Genitourinary: Negative for dysuria.  Musculoskeletal: Negative for back pain, myalgias, neck pain and neck stiffness.  Skin: Positive for color change and wound. Negative for rash.  Allergic/Immunologic: Negative for immunocompromised state.  Neurological: Negative for dizziness, seizures, syncope, weakness, numbness and headaches.  Psychiatric/Behavioral: Negative for confusion.    Physical Exam Updated Vital Signs BP (!) 129/59 (BP Location: Left Arm)   Pulse 82   Temp 98.3 F (36.8 C) (Oral)   Resp 18   Wt 108.9 kg   SpO2 93%   BMI 36.49 kg/m   Physical Exam Vitals and nursing note reviewed.  Constitutional:      Appearance: He is well-developed.     Comments: Chronically ill-appearing male  HENT:     Head: Normocephalic.  Eyes:     Conjunctiva/sclera: Conjunctivae normal.  Cardiovascular:     Rate and Rhythm: Normal rate and regular rhythm.     Heart sounds: No murmur.  Pulmonary:     Effort: Pulmonary effort is normal.  Abdominal:     General: There is no distension.     Palpations: Abdomen is soft.     Comments: Obese, but soft and nondistended.  Musculoskeletal:     Cervical back: Neck supple.     Right lower leg: Edema present.     Left lower leg: Edema present.     Comments: There is a draining wound located on the medial aspect of the left thigh with foul-smelling, purulent drainage.  There is surrounding redness and warmth.  No streaking.   Peripheral edema noted to the bilateral lower extremities with mild associated erythema.  No warmth or induration.  DP and PT  pulses are 2+ and symmetric.  Sensation is intact and equal.  Skin:    General: Skin is warm and dry.     Coloration: Skin is not jaundiced.     Findings: Erythema present. No rash.  Neurological:     Mental Status: He is alert.  Psychiatric:        Behavior: Behavior normal.     Left thigh wound     ED Results / Procedures / Treatments   Labs (all labs ordered are listed, but only abnormal results are displayed) Labs Reviewed  COMPREHENSIVE METABOLIC PANEL - Abnormal; Notable for the following components:      Result Value   Glucose, Bld 113 (*)    Albumin 3.2 (*)    Total Bilirubin 0.2 (*)    All other components within normal limits  CBC WITH DIFFERENTIAL/PLATELET - Abnormal; Notable for the following components:   RBC 4.19 (*)    Monocytes Absolute 1.1 (*)    All other components within normal limits  PROTIME-INR - Abnormal; Notable for the following components:   Prothrombin Time 22.6 (*)    INR 2.0 (*)    All other components within normal limits  LACTIC ACID, PLASMA - Abnormal; Notable for the following components:   Lactic Acid, Venous 3.6 (*)    All other components within normal limits  CULTURE, BLOOD (ROUTINE X 2)  CULTURE, BLOOD (ROUTINE X 2)  LACTIC ACID, PLASMA    EKG None  Radiology No results found.  Procedures Procedures (including critical care time)  Medications Ordered in ED Medications  metoprolol tartrate (LOPRESSOR) tablet 12.5 mg (has no administration in time range)  topiramate (TOPAMAX) tablet 50 mg (has no administration in time range)  pantoprazole (PROTONIX) EC tablet 40 mg (has no administration in time range)  albuterol (VENTOLIN HFA) 108 (90 Base) MCG/ACT inhaler 2 puff (has no administration in time range)  fentaNYL (SUBLIMAZE) injection 100 mcg (has no administration in time range)  vancomycin (VANCOREADY) IVPB 2000 mg/400 mL (has no administration in time range)    ED Course  I have reviewed the triage vital signs and  the nursing notes.  Pertinent labs & imaging results that were available during my care of the patient were reviewed by me and considered in my medical decision making (see chart for details).    MDM Rules/Calculators/A&P                      63 year old male with a history of antiphospholipid antibody syndrome on coumadin (INR 2-3), COPD, diabetes mellitus type 2, HTN resenting with a chronic wound to the left thigh that has acutely worsened over the last week with worsening pain, redness, and purulent drainage.  He has had no constitutional symptoms.  The wound was cultured earlier this week at wound care and grew staph lugdunensis.  Wound care called him in a course of doxycycline, but the patient has not yet started the medication because he is concerned that he needs IV antibiotics and to be evaluated by surgery.  Afebrile.  He is normotensive without tachycardia, tachypnea, or hypoxia.  Lactate was initially 3.6 on arrival but cleared to 1.8 on recheck without treatment.  No leukocytosis.  INR is slightly subtherapeutic at 2.0.  The patient has missed his home Coumadin since yesterday, but otherwise has been compliant with his home medication.  The patient and I had a long shared decision-making conversation.  Since his chronic wound has acutely worsened, I think it is reasonable to get a CT scan to further assess the severity of the wound as there appears to be a large cavity in the medial aspect of the thigh.  He is concerned that the wound is located over where his venous graft was taken for his CABG 2-1/2 years ago.  We will give a dose of IV vancomycin since his wound culture was sensitive to this and further evaluate with CT with contrast for underlying myositis.  I have a low suspicion for osteomyelitis or necrotizing fasciitis at this time.   Patient care transferred to PA Gibbons at the end of my shift to follow-up on CT results. Patient presentation, ED course, and plan of care  discussed with review of all pertinent labs and imaging. Please see his/her note for further details regarding further ED course and disposition.   Final Clinical Impression(s) / ED Diagnoses Final diagnoses:  None    Rx / DC Orders ED Discharge Orders    None       Barkley BoardsMcDonald, Latima Hamza A, PA-C 03/14/19 0741    Nira Connardama, Pedro Eduardo, MD 03/15/19 [phone]

## 2019-03-14 NOTE — Discharge Planning (Signed)
EDCM to follow for disposition needs.  

## 2019-03-14 NOTE — ED Provider Notes (Signed)
0730: Hand off from previous EDPA at shift changes. See previous note for full details  Briefly h/o chronic LLE wound and seroma  Has not followed up with general surgery  Seeing wound clinic last 1 week ago, sent prescription for doxycycline but has not taken any yet [facility] 2 years ago pt concerned this is related  1 week of purulent drainage, odor  No constitutional symptoms. No SIRS criteria but isolated lactic elevated, resolved on its own here  Pt concern he needs IV abx, culture positive on 12/23 for staph lugdunensis  Patient would like to be eval by general surgery  Coumadin goal 2.5-3.5 subtherapeutic today but missed doses  Plan discussed at shift change  Plan for one dose of IV vanc and CT femur, consider surgery consult based on CT. Consider dc with doxycycline and general surgery f/u  Physical Exam  BP (!) 129/59 (BP Location: Left Arm)   Pulse 82   Temp 98.3 F (36.8 C) (Oral)   Resp 18   Wt 108.9 kg   SpO2 93%   BMI 36.49 kg/m   Physical Exam Constitutional:      Appearance: He is well-developed.  HENT:     Head: Normocephalic.     Nose: Nose normal.  Eyes:     General: Lids are normal.  Cardiovascular:     Rate and Rhythm: Normal rate.  Pulmonary:     Effort: Pulmonary effort is normal. No respiratory distress.  Musculoskeletal:        General: Normal range of motion.     Cervical back: Normal range of motion.  Skin:    Comments: Left distal medial thigh with open wound malodorous. There is yellow adherent tissue in center of wound, clear liquid malodorous discharge expressed with pressure. Wound edges are erythematous, firm and slightly warm.  No fluctuance. No streaking outward from wound. Posterior thigh, knee normal. Full ROM of knee without pain. Compartments of LLE soft non tender. Full ROM of extremity without significant pain   Neurological:     Mental Status: He is alert.  Psychiatric:        Behavior: Behavior normal.     ED Course/Procedures      Procedures  MDM   1518: CT scan shows thick-walled abscess in the medial distal thigh communicating with open wound on the skin approximately 10 x 6.5 x 2.5 with surrounding cellulitis.  No myofascitis, osteomyelitis or septic arthritis.  General surgery team evaluated patient in the ER, recommended orthopedic surgery formal evaluation in the ER given location and closeness to the knee joint.  Orthopedic surgery team also evaluated patient in the ER, they recommend vascular study to assess blood flow to the area.  They are able to operate for washout and wound VAC.  Wound has soaked through several bandages here in ER. Will discuss with medicine team for admission.  1530: Spoke to Dr Eliseo Squires who will come see patient. Pt updated.      Kinnie Feil, PA-C 03/14/19 1532    Davonna Belling, MD 03/14/19 [phone]

## 2019-03-14 NOTE — ED Notes (Signed)
Pt arrived to Rm 52 via stretcher. Pt alert, oriented x 4 - O2 infusing at 3L/min.

## 2019-03-14 NOTE — Consult Note (Signed)
[facility] Surgery Consult Note  Jose [name] Sep 07, 1955  [id number].    Requesting MD: Sharen Heck PA Chief Complaint/Reason for Consult: chronic leg wound  HPI:  Jose [name] is a 63yo male with MMP including COPD, CAD, h/o 7 cardiac stents and CABG 2017 on coumadin, antiphospholipid syndrome, and h/o DVT/PE who presented to [facility] complaining of chronic leg wound. States that it has been present for about 2 years after he underwent CABG with venous graft from leg.  He has been under the management of Dr. Pleas Patricia of [facility] in [place], last seen 2 weeks ago. States that ~6 months ago he had a drain placed which was removed 3 weeks later. He was referred to the wound clinic here in [place] and saw Dr. Leanord Hawking ~2 weeks ago where he was told that he may need referral to plastic surgery for wound management. He has another appointment on 03/21/2019. He comes to the ED today complaining of worsening foul smelling drainage from the wound. Denies fever, chills, or increased pain/erythema at the wound site. Culture was taken 12/23 at wound clinic and is growing staphylococcus lugdunensis. Dr. Leanord Hawking called in prescription for doxycycline but he has not yet started this medication. CT scan shows 10 x 6.5 x 2.5 cm thick walled abscess involving the medial distal thigh and communicating with an open wound on the skin; diffuse cellulitis but no findings for myofasciitis, pyomyositis, septic arthritis or osteomyelitis.   ROS: Review of Systems  Constitutional: Negative.   HENT: Negative.   Eyes: Negative.   Respiratory: Negative.   Cardiovascular: Positive for leg swelling. Negative for chest pain.  Gastrointestinal: Negative.   Genitourinary: Negative.   Musculoskeletal: Positive for joint pain.       Left leg pain/wound  Skin: Negative.   Neurological: Negative.    All systems reviewed and otherwise negative except for as above  Family History  Problem Relation Age of Onset  . CAD  Father 1    Past Medical History:  Diagnosis Date  . Antiphospholipid antibody syndrome (HCC)   . CAD S/P percutaneous coronary angioplasty '98, '04, Feb 2017   [facility]  . Carotid arterial disease (HCC) 10/2015   moderate bilateral  . COPD (chronic obstructive pulmonary disease) (HCC)   . Coronary artery disease   . Hypertension   . Non-insulin treated type 2 diabetes mellitus (HCC)   . Pulmonary emboli (HCC) 2011  . RBBB   . Sleep apnea     Past Surgical History:  Procedure Laterality Date  . APPENDECTOMY    . BACK SURGERY  '89, '06  . COLONOSCOPY    . CORONARY ANGIOPLASTY WITH STENT PLACEMENT  '98, '04, Feb 2017    Social History:  reports that he has quit smoking. His smoking use included cigarettes. He smoked 0.50 packs per day. He has never used smokeless tobacco. He reports that he does not drink alcohol or use drugs.  Allergies:  Allergies  Allergen Reactions  . Melatonin Anxiety  . Ramipril     cough    (Not in a hospital admission)   Prior to Admission medications   Medication Sig Start Date End Date Taking? Authorizing Provider  albuterol (PROVENTIL) (5 MG/ML) 0.5% nebulizer solution Take 0.5 mLs (2.5 mg total) by nebulization every 6 (six) hours as needed for wheezing or shortness of breath. 12/27/18   Terald Sleeper, MD  ALPRAZolam Prudy Feeler) 0.5 MG tablet TAKE ONE TABLET (0.5 MG TOTAL) BY MOUTH 3 (THREE) TIMES A DAY AS NEEDED FOR  SLEEP OR ANXIETY. 09/13/17   [provider]  aspirin EC 81 MG tablet Take 81 mg by mouth daily.    [provider]  Blood Glucose Monitoring Suppl (GLUCOCOM BLOOD GLUCOSE MONITOR) [place] 1 each by XX route as directed (For Blood Glucose Monitoring) 08/23/17   [provider]  colchicine 0.6 MG tablet Take 1 tablet (0.6 mg total) by mouth 2 (two) times daily. Patient not taking: Reported on 10/04/2017 02/12/17   Roxan Hockey, MD  furosemide (LASIX) 40 MG tablet Take 1 tablet (40 mg total) by mouth  daily. Patient not taking: Reported on 10/04/2017 02/12/17   Roxan Hockey, MD  isosorbide mononitrate (IMDUR) 60 MG 24 hr tablet Take 1 tablet (60 mg total) by mouth daily. Patient not taking: Reported on 10/04/2017 02/13/17   Roxan Hockey, MD  methylPREDNISolone (MEDROL DOSEPAK) 4 MG TBPK tablet Use as directed on package 12/27/18   Wyvonnia Dusky, MD  metoprolol succinate (TOPROL-XL) 100 MG 24 hr tablet Take 100 mg by mouth daily. 08/04/16   [provider]  metoprolol tartrate (LOPRESSOR) 12.5 mg TABS tablet Take by mouth. 08/22/17 08/22/18  [provider]  nitroGLYCERIN (NITROSTAT) 0.4 MG SL tablet Place 0.4 mg under the tongue every 5 (five) minutes as needed for chest pain.  07/07/16   [provider]  pantoprazole (PROTONIX) 40 MG tablet Take 40 mg by mouth 2 (two) times daily. 06/03/16   [provider]  polyethylene glycol (MIRALAX / GLYCOLAX) packet Take 17 g by mouth daily. Patient not taking: Reported on 10/04/2017 02/12/17   Roxan Hockey, MD  potassium chloride (MICRO-K) 10 MEQ CR capsule Take by mouth. 08/22/17 08/22/18  [provider]  PROAIR HFA 108 (90 Base) MCG/ACT inhaler Inhale 1-2 puffs into the lungs every 4 (four) hours as needed for wheezing.  07/07/16   [provider]  rosuvastatin (CRESTOR) 10 MG tablet Take 10 mg by mouth every evening. 07/07/16   [provider]  tamsulosin (FLOMAX) 0.4 MG CAPS capsule Take 0.4 mg by mouth daily. 06/05/16   [provider]  Tiotropium Bromide-Olodaterol (STIOLTO RESPIMAT) 2.5-2.5 MCG/ACT AERS Inhale into the lungs. 09/10/17   [provider]  topiramate (TOPAMAX) 50 MG tablet Take 50 mg by mouth 2 (two) times daily. 06/05/16   [provider]  torsemide (DEMADEX) 20 MG tablet Take by mouth. 07/05/17 07/05/18  [provider]  warfarin (COUMADIN) 5 MG tablet Take 5-7.5 mg by mouth See admin instructions. Take 7.5 mg on Monday, Wednesday and  Friday then take 5 mg all the other days 07/17/16   [provider]    Blood pressure 109/61, pulse 82, temperature 98.3 F (36.8 C), temperature source Oral, resp. rate 18, weight 108.9 kg, SpO2 94 %. Physical Exam: General: pleasant, chronically ill appearing white male who is laying in bed in NAD HEENT: head is normocephalic, atraumatic.  Sclera are noninjected.  Pupils equal and round.  Ears and nose without any masses or lesions.  Mouth is pink and moist. Dentition fair Heart: regular, rate, and rhythm.  No obvious murmurs, gallops, or rubs noted.  Feet WWP Lungs: CTAB, no wheezes, rhonchi, or rales noted.  Respiratory effort nonlabored Abd: obese, incisions, soft, NT/ND, +BS, no masses, hernias, or organomegaly MS: 1+ edema BLE, 2x2cm open wound just proximal to medial left knee joint line with purulent drainage, some surrounding erythema and chronic skin changes, wound tracks about 6cm proximally and 6cm distally towards knee joint Skin: warm and dry  with no other masses, lesions, or rashes Psych: A&Ox3 with an appropriate affect. Neuro: cranial nerves grossly intact, extremity CSM intact bilaterally, normal speech  Results for orders placed or performed during the hospital encounter of 03/13/19 (from the past 48 hour(s))  Lactic acid, plasma     Status: Abnormal   Collection Time: 03/13/19  5:04 PM  Result Value Ref Range   Lactic Acid, Venous 3.6 (HH) 0.5 - 1.9 mmol/L    Comment: CRITICAL RESULT CALLED TO, READ BACK BY AND VERIFIED WITH: RN K GIBSON AT 2021 03/13/2019 BY L BENFIELD Performed at [facility] Lab, [address], [place], [place] [address]   Comprehensive metabolic panel     Status: Abnormal   Collection Time: 03/13/19  5:10 PM  Result Value Ref Range   Sodium 140 135 - 145 mmol/L   Potassium 3.8 3.5 - 5.1 mmol/L   Chloride 104 98 - 111 mmol/L   CO2 24 22 - 32 mmol/L   Glucose, Bld 113 (H) 70 - 99 mg/dL   BUN 15 8 - 23 mg/dL   Creatinine, Ser 0.09  0.61 - 1.24 mg/dL   Calcium 9.2 8.9 - 38.1 mg/dL   Total Protein 6.7 6.5 - 8.1 g/dL   Albumin 3.2 (L) 3.5 - 5.0 g/dL   AST 26 15 - 41 U/L   ALT 24 0 - 44 U/L   Alkaline Phosphatase 52 38 - 126 U/L   Total Bilirubin 0.2 (L) 0.3 - 1.2 mg/dL   GFR calc non Af Amer >60 >60 mL/min   GFR calc Af Amer >60 >60 mL/min   Anion gap 12 5 - 15    Comment: Performed at [facility] Lab, [address], [place], [place] [address]  CBC with Differential     Status: Abnormal   Collection Time: 03/13/19  5:10 PM  Result Value Ref Range   WBC 10.1 4.0 - 10.5 K/uL   RBC 4.19 (L) 4.22 - 5.81 MIL/uL   Hemoglobin 13.0 13.0 - 17.0 g/dL   HCT 71.6 96.7 - 89.3 %   MCV 94.5 80.0 - 100.0 fL   MCH 31.0 26.0 - 34.0 pg   MCHC 32.8 30.0 - 36.0 g/dL   RDW 81.0 17.5 - 10.2 %   Platelets 353 150 - 400 K/uL   nRBC 0.0 0.0 - 0.2 %   Neutrophils Relative % 59 %   Neutro Abs 6.1 1.7 - 7.7 K/uL   Lymphocytes Relative 27 %   Lymphs Abs 2.7 0.7 - 4.0 K/uL   Monocytes Relative 10 %   Monocytes Absolute 1.1 (H) 0.1 - 1.0 K/uL   Eosinophils Relative 2 %   Eosinophils Absolute 0.2 0.0 - 0.5 K/uL   Basophils Relative 1 %   Basophils Absolute 0.1 0.0 - 0.1 K/uL   Immature Granulocytes 1 %   Abs Immature Granulocytes 0.05 0.00 - 0.07 K/uL    Comment: Performed at [facility], [address] [place], [place] [address]  Protime-INR     Status: Abnormal   Collection Time: 03/13/19  5:10 PM  Result Value Ref Range   Prothrombin Time 22.6 (H) 11.4 - 15.2 seconds   INR 2.0 (H) 0.8 - 1.2    Comment: (NOTE) INR goal varies based on device and disease states. Performed at [facility] Lab, [address], [place], [place] [address]   Culture, blood (Routine x 2)     Status: None (Preliminary result)   Collection Time:  03/13/19  5:10 PM   Specimen: BLOOD  Result Value Ref Range   Specimen Description BLOOD RIGHT ANTECUBITAL    Special Requests      BOTTLES DRAWN AEROBIC AND ANAEROBIC Blood Culture adequate  volume   Culture      NO GROWTH < 24 HOURS Performed at [facility], [address] [place], [place] [address]    Report Status PENDING   Culture, blood (Routine x 2)     Status: None (Preliminary result)   Collection Time: 03/13/19  5:15 PM   Specimen: BLOOD  Result Value Ref Range   Specimen Description BLOOD LEFT ANTECUBITAL    Special Requests      BOTTLES DRAWN AEROBIC AND ANAEROBIC Blood Culture adequate volume   Culture      NO GROWTH < 24 HOURS Performed at [facility], [address] [place], [place] [address]    Report Status PENDING   Lactic acid, plasma     Status: None   Collection Time: 03/13/19 10:37 PM  Result Value Ref Range   Lactic Acid, Venous 1.8 0.5 - 1.9 mmol/L    Comment: Performed at [facility]  Lab, [address] [place], [place] [address]   No results found.    Assessment/Plan COPD CAD, h/o 7 cardiac stents and CABG 2017 on coumadin Antiphospholipid syndrome H/o DVT/PE  Chronic distal medial thigh wound 10 x 6.5 x 2.5 cm abscess involving the medial distal thigh - With such close proximity to the knee joint I would recommend ortho evaluation. Depending on their thoughts, may consider packing wound twice daily with saline dampened gauze. He has a prescription for doxycycline. He does not look ill from this and may be able to be managed outpatient with close follow up with wound care physician as scheduled on 03/21/2019.   Franne FortsBrooke A Cherika Jessie, PA-C [facility] 03/14/2019, 1:28 PM Please see Amion for pager number during day hours 7:00am-4:30pm

## 2019-03-14 NOTE — Consult Note (Signed)
Reason for Consult:Left leg wound Referring Physician: Ruel Favors  Jose [name] is an 63 y.o. male.  HPI: Paula comes to the ED with a chronic draining LLE wound. This all started with a vein harvest as part of a CABG last year. He quickly developed a seroma. This was large enough that it caused problems with his cardiac rehab. He waited ~ 1 year but it never improved. He was referred to IR and had it aspirated but it reaccumulated overnight. He then was referred to general surgery and had what sounds like a hemovac or JP drain placed for 3 weeks. This was removed and seemed to work somewhat as he was left with 2 smaller areas instead of 1 larger one. However, these quickly began to get larger and hurt again. He went back to the surgeon who originally was going to I&D but decided instead to send him to wound care center. All of above took place in [place] area. Just before he saw wound care here one of the areas opened up and began to drain. Cultures were taken that showed a mildly resistant Staph and he was prescribed doxy but hadn't taken any yet. He came to the ED because he and his family can't handle the amount of drainage from the wound at home.  Past Medical History:  Diagnosis Date  . Antiphospholipid antibody syndrome (HCC)   . CAD S/P percutaneous coronary angioplasty '98, '04, Feb 2017   [facility]  . Carotid arterial disease (HCC) 10/2015   moderate bilateral  . COPD (chronic obstructive pulmonary disease) (HCC)   . Coronary artery disease   . Hypertension   . Non-insulin treated type 2 diabetes mellitus (HCC)   . Pulmonary emboli (HCC) 2011  . RBBB   . Sleep apnea     Past Surgical History:  Procedure Laterality Date  . APPENDECTOMY    . BACK SURGERY  '89, '06  . COLONOSCOPY    . CORONARY ANGIOPLASTY WITH STENT PLACEMENT  '98, '04, Feb 2017    Family History  Problem Relation Age of Onset  . CAD Father 42    Social History:  reports that he has quit smoking. His smoking  use included cigarettes. He smoked 0.50 packs per day. He has never used smokeless tobacco. He reports that he does not drink alcohol or use drugs.  Allergies:  Allergies  Allergen Reactions  . Melatonin Anxiety  . Ramipril     cough    Medications: I have reviewed the patient's current medications.  Results for orders placed or performed during the hospital encounter of 03/13/19 (from the past 48 hour(s))  Lactic acid, plasma     Status: Abnormal   Collection Time: 03/13/19  5:04 PM  Result Value Ref Range   Lactic Acid, Venous 3.6 (HH) 0.5 - 1.9 mmol/L    Comment: CRITICAL RESULT CALLED TO, READ BACK BY AND VERIFIED WITH: RN K GIBSON AT 2021 03/13/2019 BY L BENFIELD Performed at [facility], [address], [place], [place] [address]   Comprehensive metabolic panel     Status: Abnormal   Collection Time: 03/13/19  5:10 PM  Result Value Ref Range   Sodium 140 135 - 145 mmol/L   Potassium 3.8 3.5 - 5.1 mmol/L   Chloride 104 98 - 111 mmol/L   CO2 24 22 - 32 mmol/L   Glucose, Bld 113 (H) 70 - 99 mg/dL   BUN 15 8 - 23 mg/dL   Creatinine, Ser 2.95 0.61 -  1.24 mg/dL   Calcium 9.2 8.9 - 21.3 mg/dL   Total Protein 6.7 6.5 - 8.1 g/dL   Albumin 3.2 (L) 3.5 - 5.0 g/dL   AST 26 15 - 41 U/L   ALT 24 0 - 44 U/L   Alkaline Phosphatase 52 38 - 126 U/L   Total Bilirubin 0.2 (L) 0.3 - 1.2 mg/dL   GFR calc non Af Amer >60 >60 mL/min   GFR calc Af Amer >60 >60 mL/min   Anion gap 12 5 - 15    Comment: Performed at [facility], [address], [place], [place] [address]  CBC with Differential     Status: Abnormal   Collection Time: 03/13/19  5:10 PM  Result Value Ref Range   WBC 10.1 4.0 - 10.5 K/uL   RBC 4.19 (L) 4.22 - 5.81 MIL/uL   Hemoglobin 13.0 13.0 - 17.0 g/dL   HCT 84.6 96.2 - 95.2 %   MCV 94.5 80.0 - 100.0 fL   MCH 31.0 26.0 - 34.0 pg   MCHC 32.8 30.0 - 36.0 g/dL   RDW 84.1 32.4 - 40.1 %   Platelets 353 150 - 400 K/uL   nRBC 0.0 0.0 - 0.2 %   Neutrophils  Relative % 59 %   Neutro Abs 6.1 1.7 - 7.7 K/uL   Lymphocytes Relative 27 %   Lymphs Abs 2.7 0.7 - 4.0 K/uL   Monocytes Relative 10 %   Monocytes Absolute 1.1 (H) 0.1 - 1.0 K/uL   Eosinophils Relative 2 %   Eosinophils Absolute 0.2 0.0 - 0.5 K/uL   Basophils Relative 1 %   Basophils Absolute 0.1 0.0 - 0.1 K/uL   Immature Granulocytes 1 %   Abs Immature Granulocytes 0.05 0.00 - 0.07 K/uL    Comment: Performed at [facility] Lab, [address], [place], [place] [address]  Protime-INR     Status: Abnormal   Collection Time: 03/13/19  5:10 PM  Result Value Ref Range   Prothrombin Time 22.6 (H) 11.4 - 15.2 seconds   INR 2.0 (H) 0.8 - 1.2    Comment: (NOTE) INR goal varies based on device and disease states. Performed at [facility], [address] [place], [place] [address]   Culture, blood (Routine x 2)     Status: None (Preliminary result)   Collection Time: 03/13/19  5:10 PM   Specimen: BLOOD  Result Value Ref Range   Specimen Description BLOOD RIGHT ANTECUBITAL    Special Requests      BOTTLES DRAWN AEROBIC AND ANAEROBIC Blood Culture adequate volume   Culture      NO GROWTH < 24 HOURS Performed at [facility], [address], [place], [place] [address]    Report Status PENDING   Culture, blood (Routine x 2)     Status: None (Preliminary result)   Collection Time: 03/13/19  5:15 PM   Specimen: BLOOD  Result Value Ref Range   Specimen Description BLOOD LEFT ANTECUBITAL    Special Requests      BOTTLES DRAWN AEROBIC AND ANAEROBIC Blood Culture adequate volume   Culture      NO GROWTH < 24 HOURS Performed at [facility], [address] [place], [place] [address]    Report Status PENDING   Lactic acid, plasma     Status: None   Collection Time: 03/13/19 10:37 PM  Result Value Ref Range   Lactic Acid, Venous 1.8 0.5 - 1.9  mmol/L    Comment: Performed at [facility] Lab, [place] [address] [place], [place] [address]    CT FEMUR LEFT W  CONTRAST  Result Date: 03/14/2019 CLINICAL DATA:  Thigh pain and swelling. EXAM: CT OF THE LOWER RIGHT EXTREMITY WITH CONTRAST TECHNIQUE: Multidetector CT imaging of the lower right extremity was performed according to the standard protocol following intravenous contrast administration. COMPARISON:  None. CONTRAST:  153mL OMNIPAQUE IOHEXOL 300 MG/ML  SOLN FINDINGS: Skin thickening and subcutaneous soft tissue swelling/edema throughout the entire left thigh consistent with cellulitis. More distally there is a discrete thick-walled abscess communicating with an open wound on the skin. This contains fluid and gas. It is located distally just above the knee joint medially. This measures a maximum of 10.3 x 6.6 x 2.4 cm. This is superficial to the sartorius muscle. I do not see any definite muscle involvement. The muscular compartment structures appear normal. No findings for myofasciitis or pyomyositis. There is a nearby surgical clip likely due to vein harvesting from bypass surgery 2 years ago. The hip and knee joints are maintained. I do not see any findings suspicious for septic arthritis. The femur is intact. No evidence of osteomyelitis. The visualized bony pelvis is intact. SI joint degenerative changes are noted and mild hip joint degenerative changes. The major vascular structures are patent. Scattered atherosclerotic calcifications are noted. No evidence of deep venous thrombosis. No significant intrapelvic abnormalities are identified. No inguinal mass or adenopathy. IMPRESSION: 1. 10 x 6.5 x 2.5 cm thick walled abscess involving the medial distal thigh and communicating with an open wound on the skin. 2. Diffuse cellulitis but no findings for myofasciitis, pyomyositis, septic arthritis or osteomyelitis. Electronically Signed   By: Marijo Sanes M.D.   On: 03/14/2019 09:24    Review of Systems  Constitutional: Negative for chills, diaphoresis and fever.  HENT: Negative for ear discharge, ear pain,  hearing loss and tinnitus.   Eyes: Negative for photophobia and pain.  Respiratory: Negative for cough and shortness of breath.   Cardiovascular: Negative for chest pain.  Gastrointestinal: Negative for abdominal pain, nausea and vomiting.  Genitourinary: Negative for dysuria, flank pain, frequency and urgency.  Musculoskeletal: Positive for myalgias (Left thigh). Negative for back pain and neck pain.  Neurological: Negative for dizziness and headaches.  Hematological: Does not bruise/bleed easily.  Psychiatric/Behavioral: The patient is not nervous/anxious.    Blood pressure 109/61, pulse 82, temperature 98.3 F (36.8 C), temperature source Oral, resp. rate 18, weight 108.9 kg, SpO2 94 %. Physical Exam  Constitutional: He appears well-developed and well-nourished. No distress.  HENT:  Head: Normocephalic and atraumatic.  Eyes: Conjunctivae are normal. Right eye exhibits no discharge. Left eye exhibits no discharge. No scleral icterus.  Cardiovascular: Normal rate and regular rhythm.  Respiratory: Effort normal. No respiratory distress.  Musculoskeletal:     Cervical back: Normal range of motion.     Comments: LLE No traumatic wounds, ecchymosis, or rash  Wound medial leg just superior to knee, copious purulent discharge noted, mod not well reproducible TTP  No knee or ankle effusion  Knee stable to varus/ valgus and anterior/posterior stress  Sens DPN, SPN, TN intact  Motor EHL, ext, flex, evers 5/5  DP 0, PT 0, 1+ pitting edema (= bilaterally)  Neurological: He is alert.  Skin: Skin is warm and dry. He is not diaphoretic.  Psychiatric: He has a normal mood and affect. His behavior is normal.    Assessment/Plan: Left leg wound -- I  think at this point the pt needs a formal I&D, likely with wound VAC, and likely eventual plastics consult. He first needs dopplers to r/o significant vascular compromise and, if abnormal, vascular surgery consult. He'll also need surgical clearance  which medicine will provide. Potential surgery on Friday depending on above with consideration to his coumadin and possible heparin bridge. Dr. Lajoyce Cornersuda to evaluate. Multiple medical problems including CHF, OSA, CAD s/p CABG, antiphospholipid syndrome on coumadin, and HTN -- per internal medicine who will admit and manage. Appreciate their help.    Freeman CaldronMichael J. Taliyah Watrous, PA-C Orthopedic Surgery [phone] 03/14/2019, 3:00 PM

## 2019-03-14 NOTE — Progress Notes (Signed)
Pharmacy Antibiotic Note  Jose [name] is a 63 y.o. male admitted on 03/13/2019 with wound infection/abscess with outpatient culture 12/23 growing staph lugdunensis. Patient prescribed doxy PTA but has not started yet. Pharmacy has been consulted for vancomycin dosing. Afebrile, WBC 10.1, LA 1.8. SCr 0.86 on admit.  Plan: Vancomycin 2g IV x 1; then Vancomycin 1000 mg IV Q 12 hrs. Goal AUC 400-550. Expected AUC: 490 SCr used: 0.86 Monitor clinical progress, c/s, renal function F/u de-escalation plan/LOT, vancomycin levels as indicated   Weight: 240 lb (108.9 kg)  Temp (24hrs), Avg:98.6 F (37 C), Min:98.2 F (36.8 C), Max:99.7 F (37.6 C)  Recent Labs  Lab 03/13/19 1704 03/13/19 1710 03/13/19 2237  WBC  --  10.1  --   CREATININE  --  0.86  --   LATICACIDVEN 3.6*  --  1.8    Estimated Creatinine Clearance: 105.2 mL/min (by C-G formula based on SCr of 0.86 mg/dL).    Allergies  Allergen Reactions  . Melatonin Anxiety  . Ramipril     cough    Elicia Lamp, PharmD, BCPS Please check AMION for all Price contact numbers Clinical Pharmacist 03/14/2019 7:24 AM

## 2019-03-14 NOTE — Progress Notes (Signed)
Jose [name], Jose [name] ([id number]) Visit Report for 03/07/2019 Chief Complaint Document Details Patient Name: Date of Service: Jose, [name] 03/07/2019 2:45 PM Medical Record Jose [name] Patient Account Number: [id number] Date of Birth/Sex: Treating RN: 1955/07/09 (63 y.o. Male) Primary Care Provider: Jola [name] Other Clinician: Referring Provider: Treating Provider/Extender:Jose [name], Jose [name], Jose [name] in Treatment: 0 Information Obtained from: Patient Chief Complaint 03/07/2019; patient is here for review of an opening on the medial aspect of his thigh just above his knee. Electronic Signature(s) Signed: 03/14/2019 2:25:28 PM By: Jose Najjar [name] Entered By: Jose [name] on 03/09/2019 11:55:47 -------------------------------------------------------------------------------- Debridement Details Patient Name: Date of Service: Jose, [name] 03/07/2019 2:45 PM Medical Record [id number] Patient Account Number: [id number] Date of Birth/Sex: [birth year] (63 y.o. Male) Treating RN: Primary Care Provider: Jola [name] Other Clinician: Referring Provider: Treating Provider/Extender:Jose [name], Jose [name], Jose Beckers Weeks in Treatment: 0 Debridement Performed for Wound #1 Left,Medial Upper Leg Assessment: Performed By: Physician Jose [name] Debridement Type: Debridement Level of Consciousness (Pre- Awake and Alert procedure): Pre-procedure Verification/Time Out Taken: Yes - 17:17 Start Time: 17:17 Pain Control: Other : benzocaine 20% Total Area Debrided (L x W): 2.4 (cm) x 3 (cm) = 7.2 (cm) Tissue and other material Viable, Non-Viable, Eschar, Slough, Skin: Dermis , Slough debrided: Level: Skin/Dermis Debridement Description: Selective/Open Wound Instrument: Curette Bleeding: Moderate Hemostasis Achieved: Pressure End Time: 17:22 Procedural Pain: 0 Post Procedural Pain: 0 Response to Treatment: Procedure was tolerated well Level of  Consciousness Awake and Alert (Post-procedure): Post Debridement Measurements of Total Wound Length: (cm) 2.4 Width: (cm) 3 Depth: (cm) 3 Volume: (cm) 16.965 Character of Wound/Ulcer Post Improved Debridement: Post Procedure Diagnosis Same as Pre-procedure Electronic Signature(s) Signed: 03/14/2019 2:25:28 PM By: Jose Najjar [name] Previous Signature: 03/08/2019 11:01:46 AM Version By: Yevonne Pax RN Entered By: Jose [name] on 03/09/2019 11:55:06 -------------------------------------------------------------------------------- HPI Details Patient Name: Date of Service: Jose, [name] 03/07/2019 2:45 PM Medical Record [id number] Patient Account Number: [id number] Date of Birth/Sex: Treating RN: 07/09/1955 (63 y.o. Male) Primary Care Provider: Jola [name] Other Clinician: Referring Provider: Treating Provider/Extender:Jose [name], Jose [name], Jose [name] in Treatment: 0 History of Present Illness HPI Description: ADMISSION 03/09/2019 This is a 63 year old man who arrives with a wound for review on the left posterior medial thigh just superior to the knee. When I saw this patient initially there was not really any history I could review other than the note that we got from his referring surgeon Jose [name] and the history that the patient gave. The patient was initially very anxious to start a wound VAC and indeed what it appears that is that the patient was referred here for [place] to manage wound VAC placement. The patient's story actually began in June 2019. He was admitted from 6/5 through 08/23/2017 at [facility] at which time he had a CABG x3. He had multiple previous stents. As far as I can tell reviewing his postop visits the chest surgery went fine however on 09/20/2017 and follow-up with cardiovascular surgery it was noted that he had a large hematoma at the site of his greater saphenous vein harvest site.. At that point the options were  to install a drain, compression and observation. I think he chose the latter. He had a CT scan of the leg on 8/13 with aspiration of the fluid however I think this rapidly recollected. On 8/20 a CT scan of the leg showed fluid collection most consistent with a seroma. He once again underwent drainage of this area on 11/16/2017. Since that time I  think he is being [place] managed by general surgery which is part of [facility]. I cannot see their notes in care everywhere. However the referral note from Jose [name] on 12/11 makes reference to the fact that he was 6 weeks after excision of the chronic seroma and was making good progress. It is also noted that he had previous prolonged drain placement, excision of the seroma,. He was referred here for consideration of placement of a wound VAC. Past medical history; the patient is a type II diabetic, hypercholesterolemia, hypertension, peripheral vascular disease, antiphospholipid syndrome, bilateral carotid artery disease, obstructive sleep apnea, COPD, status post CABG x3 with multiple previous stents, congestive heart failure, history of pulmonary embolism. 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 Does not appear that he is homebound Psychologist, prison and probation serviceslectronic Signature(s) Signed: 03/14/2019 2:25:28 PM By: Jose [name] Entered By: Jose Najjarobson, Jose [name] on 03/09/2019 12:04:16 -------------------------------------------------------------------------------- Physical Exam Details Patient Name: Date of Service: Jose [name] 03/07/2019 2:45 PM Medical Record [id number] Patient Account Number: [id number] Date of Birth/Sex: Treating RN: 12/01/1955 [age] y.o. Male) Primary Care Provider: Jola BabinskiFOWLER, [name] Other Clinician: Referring Provider: Treating Provider/Extender:Jose [name], Jose MageMichael [name], Jose [name] Weeks in Treatment: 0 Constitutional Sitting or standing Blood Pressure is within target range for patient.. Supine Blood  Pressure is within target range for patient.. Pulse regular and within target range for patient.Marland Kitchen. Appears in no distress. Eyes Conjunctivae clear. No discharge.no icterus. Respiratory work of breathing is normal. Cardiovascular It will pulses are palpable. Some edema in his legs but not a major amount.. Integumentary (Hair, Skin) No erythema around the wound. Psychiatric appears at normal baseline. Notes Wound exam; the patient came in with a small wound with necrotic tissue around the rim on the medial thigh just above the knee. I removed the necrotic rim with pickups and a #15 scalpel. This exposed a 2.4 x 3 x 3 cm open area. There is at least 5 cm of circumferential undermining. The patient's tenderness superiorly goes beyond this however. When he arrived here he had copious amounts of serosanguineous fluid coming out of this wound that had dripped on the floor when he was in our waiting room and continued to almost have a water fountain look to it and when I first palpated the area when I came to see him. Electronic Signature(s) Signed: 03/14/2019 2:25:28 PM By: Jose Najjarobson, Cathern Tahir [name] Entered By: Jose Najjarobson, Catelyn Friel on 03/09/2019 12:06:24 -------------------------------------------------------------------------------- Physician Orders Details Patient Name: Date of Service: Jose [name] 03/07/2019 2:45 PM Medical Record [id number] Patient Account Number: [id number] Date of Birth/Sex: Treating RN: 06/14/1955 [age] y.o. Male) Yevonne PaxEpps, Carrie Primary Care Provider: Jola BabinskiFOWLER, [name] Other Clinician: Referring Provider: Treating Provider/Extender:Esther Broyles, Jose MageMichael [name], Jose BillWALTER Weeks in Treatment: 0 Verbal / Phone Orders: No Diagnosis Coding Dressing Change Frequency Change dressing every day. - and as needed Wound Cleansing Clean wound with Normal Saline. May shower with protection. Secondary Dressing Dry Gauze ABD pad Other: - ace wrap with compression Laboratory Bacteria  identified in Unspecified specimen by Anaerobe culture (MICRO) LOINC Code: 635-3 Convenience Name: Anerobic culture Electronic Signature(s) Signed: 03/13/2019 5:45:39 PM By: Yevonne PaxEpps, Carrie RN Signed: 03/14/2019 2:25:28 PM By: Jose Najjarobson, Iley Deignan [name] Previous Signature: 03/08/2019 11:01:46 AM Version By: Yevonne PaxEpps, Carrie RN Entered By: Yevonne PaxEpps, Carrie on 03/13/2019 07:53:37 -------------------------------------------------------------------------------- Problem List Details Patient Name: Date of Service: Jose [name] 03/07/2019 2:45 PM Medical Record [id number] Patient Account Number: [id number] Date of Birth/Sex: Treating RN: 07/12/1955 [age] y.o. 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 Male) Primary Care Provider: Jola [name] Other Clinician: Referring Provider: Treating Provider/Extender:Julianna Vanwagner, Jose [name], Jose [name] in Treatment: 0 Subjective Chief Complaint Information obtained from  Patient 03/07/2019; patient is here for review of an opening on the medial aspect of his thigh just above his knee. History of Present Illness (HPI) ADMISSION 03/09/2019 This is a 63 year old man who arrives with a wound for review on the left posterior medial thigh just superior to the knee. When I saw this patient initially there was not really any history I could review other than the note that we got from his referring surgeon Jose [name] and the history that the patient gave. The patient was initially very anxious to start a wound VAC and indeed what it appears that is that the patient was referred here for [place] to manage wound VAC placement. The patient's story actually began in June 2019. He was admitted from 6/5 through 08/23/2017 at [facility] at which time he had a CABG x3. He had multiple previous stents. As far as I can tell reviewing his postop visits the chest surgery went fine however on 09/20/2017 and follow-up with cardiovascular surgery it was noted that he had a large hematoma at the site of his greater saphenous vein harvest site.. At that point the options were to install a drain, compression and observation. I think he chose the latter. He had a CT scan of the leg on 8/13 with aspiration of the fluid however I think this rapidly recollected. On 8/20 a CT scan of the leg showed fluid collection most consistent with a seroma. He once again underwent drainage of this area on 11/16/2017. Since that time I think he is being [place] managed by general surgery which is part of [facility]. I cannot see their notes in care everywhere. However the referral note from Dr. Pleas Patricia on 12/11 makes reference to the fact that he was 6 weeks after excision of the chronic seroma and was making good progress. It is also noted that he had previous prolonged drain placement, excision of the seroma,. He was referred here for consideration of placement of a wound VAC. Past  medical history; the patient is a type II diabetic, hypercholesterolemia, hypertension, peripheral vascular disease, antiphospholipid syndrome, bilateral carotid artery disease, obstructive sleep apnea, COPD, status post CABG x3 with multiple previous stents, congestive heart failure, history of pulmonary embolism. 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 Does not appear that he is homebound Patient History Information obtained from Patient. Allergies No Known Allergies Social History Current every day smoker - 1/2 ppd, Marital Status - Married, Alcohol Use - Never, Drug Use - No History, Caffeine Use - Daily - coffee x2 cups. Medical History Eyes Denies history of Cataracts, Glaucoma, Optic Neuritis Ear/Nose/Mouth/Throat Denies history of Chronic sinus problems/congestion, Middle ear  problems Hematologic/Lymphatic Patient has history of Hemophilia - Antiphospholipid syndrome Denies history of Anemia, Human Immunodeficiency Virus, Lymphedema, Sickle Cell Disease Respiratory Patient has history of Chronic Obstructive Pulmonary Disease (COPD), Sleep Apnea - BiPAP Denies history of Aspiration, Asthma, Pneumothorax, Tuberculosis Cardiovascular Patient has history of Congestive Heart Failure, Coronary Artery Disease, Hypertension, Myocardial Infarction - x2 1994 and 2001; x7 stents total, Peripheral Arterial Disease, Peripheral Venous Disease, Vasculitis Denies history of Angina, Arrhythmia, Deep Vein Thrombosis, Hypotension, Phlebitis Gastrointestinal Denies history of Cirrhosis , Colitis, Crohnoos, Hepatitis A, Hepatitis B, Hepatitis C Endocrine Denies history of Type I Diabetes, Type II Diabetes Genitourinary Denies history of End Stage Renal Disease Immunological Denies history of Lupus Erythematosus, Raynaudoos, Scleroderma Integumentary (Skin) Denies history of History of Burn Musculoskeletal Denies history of Gout, Rheumatoid  Arthritis, Osteoarthritis, Osteomyelitis Neurologic Patient has history of Neuropathy - feet Denies history of Dementia, Quadriplegia, Paraplegia, Seizure Disorder Oncologic Denies history of Received Chemotherapy, Received Radiation Psychiatric Denies history of Anorexia/bulimia, Confinement Anxiety Hospitalization/Surgery History - appendectomy. - back surgery-2005. - coronary angioplasty with stent-2001. - CABG-2019 [facility]. Medical And Surgical History Notes Cardiovascular PE, antiphospholipid antibody syndrome, RBBB, aortic valve stenosis, hyperlipidemia Gastrointestinal GERD Genitourinary ED Neurologic back issues. Review of Systems (ROS) Constitutional Symptoms (General Health) Denies complaints or symptoms of Fatigue, Fever, Chills, Marked Weight Change. Eyes Complains or has symptoms of Glasses / Contacts - glasses. Denies complaints or symptoms of Dry Eyes, Vision Changes. Ear/Nose/Mouth/Throat Denies complaints or symptoms of Chronic sinus problems or rhinitis. Respiratory Complains or has symptoms of Shortness of Breath. Denies complaints or symptoms of Chronic or frequent coughs. Gastrointestinal Denies complaints or symptoms of Frequent diarrhea, Nausea, Vomiting. Endocrine Denies complaints or symptoms of Heat/cold intolerance. Genitourinary Denies complaints or symptoms of Frequent urination. Integumentary (Skin) Complains or has symptoms of Wounds - left thigh. Musculoskeletal Denies complaints or symptoms of Muscle Pain, Muscle Weakness. Neurologic Denies complaints or symptoms of Numbness/parasthesias. Psychiatric Denies complaints or symptoms of Claustrophobia, Suicidal. Objective Constitutional Sitting or standing Blood Pressure is within target range for patient.. Supine Blood Pressure is within target range for patient.. Pulse regular and within target range for patient.Marland Kitchen Appears in no distress. Vitals Time Taken: 4:20 PM, Height: 67 in, Source:  Stated, Weight: 240 lbs, Source: Stated, BMI: 37.6, Temperature: 98.3 F, Pulse: 79 bpm, Respiratory Rate: 20 breaths/min, Blood Pressure: 126/66 mmHg. Eyes Conjunctivae clear. No discharge.no icterus. Respiratory work of breathing is normal. Cardiovascular It will pulses are palpable. Some edema in his legs but not a major amount.Marland Kitchen Psychiatric appears at normal baseline. General Notes: Wound exam; the patient came in with a small wound with necrotic tissue around the rim on the medial thigh just above the knee. I removed the necrotic rim with pickups and a #15 scalpel. This exposed a 2.4 x 3 x 3 cm open area. There is at least 5 cm of circumferential undermining. The patient's tenderness superiorly goes beyond this however. When he arrived here he had copious amounts of serosanguineous fluid coming out of this wound that had dripped on the floor when he was in our waiting room and continued to almost have a water fountain look to it and when I first palpated the area when I came to see him. Integumentary (Hair, Skin) No erythema around the wound. Wound #1 status is Open. Original cause of wound was Surgical Injury. The wound is located on the Left,Medial Upper Leg. The wound measures 2.4cm length x 3cm width x 3cm depth; 5.655cm^2 area and 16.965cm^3 volume. There  is no tunneling noted, however, there is undermining starting at 12:00 and ending at 12:00 with a maximum distance of 5cm. There is a large amount of serous drainage noted. The wound margin is distinct with the outline attached to the wound base. There is no granulation within the wound bed. There is a large (67-100%) amount of necrotic tissue within the wound bed including Eschar and Adherent Slough. 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 There was a Selective/Open Wound Skin/Dermis Debridement with a total area of 7.2 sq cm performed by Ricard Dillon., [name]. With the following instrument(s): Curette to remove Viable and Non-Viable tissue/material. Material removed includes Eschar, Slough, and Skin: Dermis after achieving pain control using Other (benzocaine 20%). No specimens were taken. A time out was conducted at 17:17, prior to the start of the procedure. A Moderate amount of bleeding was controlled with Pressure. The procedure was tolerated well with a pain level of 0 throughout and a pain level of 0 following the procedure. Post Debridement Measurements: 2.4cm length x 3cm width x 3cm depth; 16.965cm^3 volume. Character of Wound/Ulcer Post Debridement is improved. Post procedure Diagnosis Wound #1: Same as Pre-Procedure Plan Dressing Change Frequency: Change dressing every day. - and as needed Wound Cleansing: Clean wound with Normal Saline. May shower with protection. Secondary Dressing: Dry Gauze ABD pad Other: - ace wrap with compression 1. Small wound on the right medial lower thigh just above the knee. 2. Large amount of weeping fluid coming out of the wound today. A culture was done but my suspicion about infection is low 3. Circumferential undermining of at least 5 cm perhaps more. There was no erythema around the wound. 4 the patient likely has an encapsulated seroma. I was able to see pathology at [facility] that I believe was part of the wall of this area which was negative. Cytology was also negative. The notes that accompanied him from Dr. Renee Rival suggested this had been excised although I am not sure what that means. The patient seems unaware of recent surgery. There was no obvious surgical scar. He has had this drained on at least 2  occasions I can see in the records at [facility] and he apparently wore a drain for a prolonged period of time at some point and that is verified in Drs. Shadducks notes. 5. The patient was referred here for consideration of wound VAC but for various reasons I do not believe this is going to be possible. The open area is too, small although the volume of the wound is certainly much more extensive. I am not sure how this could be easily placed or managed. I am not sure that he would be eligible for home health. He did not seem amenable to being taught how to place a VAC either. 6. I will reach out to his referring surgeon Dr. Renee Rival. I am not sure that we can be helpful here. Electronic Signature(s) Signed: 03/14/2019 2:25:28 PM By: Linton Ham [name] Entered By: Linton Ham on 03/09/2019 12:17:38 -------------------------------------------------------------------------------- HxROS Details Patient Name: Date of Service: Jose [name] 03/07/2019 2:45 PM Medical Record [id number] Patient Account Number: [id number] Date of Birth/Sex: Treating RN: 06/02/1955 (63 y.o. Male) Baruch Gouty Primary Care Provider: Vickki Muff,  [name] Other Clinician: Referring Provider: Treating Provider/Extender:Yarelis Ambrosino, Jose [name], Jose Beckers Weeks in Treatment: 0 Information Obtained From Patient Constitutional Symptoms (General Health) Complaints and Symptoms: Negative for: Fatigue; Fever; Chills; Marked Weight Change Eyes Complaints and Symptoms: Positive for: Glasses / Contacts - glasses Negative for: Dry Eyes; Vision Changes Medical History: Negative for: Cataracts; Glaucoma; Optic Neuritis Ear/Nose/Mouth/Throat Complaints and Symptoms: Negative for: Chronic sinus problems or rhinitis Medical History: Negative for: Chronic sinus problems/congestion; Middle ear problems Respiratory Complaints and Symptoms: Positive for: Shortness of Breath Negative for: Chronic or frequent coughs Medical  History: Positive for: Chronic Obstructive Pulmonary Disease (COPD); Sleep Apnea - BiPAP Negative for: Aspiration; Asthma; Pneumothorax; Tuberculosis Gastrointestinal Complaints and Symptoms: Negative for: Frequent diarrhea; Nausea; Vomiting Medical History: Negative for: Cirrhosis ; Colitis; Crohns; Hepatitis A; Hepatitis B; Hepatitis C Past Medical History Notes: GERD Endocrine Complaints and Symptoms: Negative for: Heat/cold intolerance Medical History: Negative for: Type I Diabetes; Type II Diabetes Genitourinary Complaints and Symptoms: Negative for: Frequent urination Medical History: Negative for: End Stage Renal Disease Past Medical History Notes: ED Integumentary (Skin) Complaints and Symptoms: Positive for: Wounds - left thigh Medical History: Negative for: History of Burn Musculoskeletal Complaints and Symptoms: Negative for: Muscle Pain; Muscle Weakness Medical History: Negative for: Gout; Rheumatoid Arthritis; Osteoarthritis; Osteomyelitis Neurologic Complaints and Symptoms: Negative for: Numbness/parasthesias Medical History: Positive for: Neuropathy - feet Negative for: Dementia; Quadriplegia; Paraplegia; Seizure Disorder Past Medical History Notes: back issues. Psychiatric Complaints and Symptoms: Negative for: Claustrophobia; Suicidal Medical History: Negative for: Anorexia/bulimia; Confinement Anxiety Hematologic/Lymphatic Medical History: Positive for: Hemophilia - Antiphospholipid syndrome Negative for: Anemia; Human Immunodeficiency Virus; Lymphedema; Sickle Cell Disease Cardiovascular Medical History: Positive for: Congestive Heart Failure; Coronary Artery Disease; Hypertension; Myocardial Infarction - x2 1994 and 2001; x7 stents total; Peripheral Arterial Disease; Peripheral Venous Disease; Vasculitis Negative for: Angina; Arrhythmia; Deep Vein Thrombosis; Hypotension; Phlebitis Past Medical History Notes: PE, antiphospholipid antibody  syndrome, RBBB, aortic valve stenosis, hyperlipidemia Immunological Medical History: Negative for: Lupus Erythematosus; Raynauds; Scleroderma Oncologic Medical History: Negative for: Received Chemotherapy; Received Radiation Immunizations Pneumococcal Vaccine: Received Pneumococcal Vaccination: Yes Implantable Devices Yes Hospitalization / Surgery History Type of Hospitalization/Surgery appendectomy back surgery-2005 coronary angioplasty with stent-2001 CABG-2019 [facility] Family and Social History Current every day smoker - 1/2 ppd; Marital Status - Married; Alcohol Use: Never; Drug Use: No History; Caffeine Use: Daily - coffee x2 cups; Financial Concerns: No; Food, Clothing or Shelter Needs: No; Support System Lacking: No; Transportation Concerns: No Electronic Signature(s) Signed: 03/07/2019 5:14:15 PM By: Shawn Stall Signed: 03/07/2019 6:13:08 PM By: Zenaida Deed RN, BSN Signed: 03/14/2019 2:25:28 PM By: Jose Najjar [name] Entered By: Shawn Stall on 03/07/2019 16:32:12 -------------------------------------------------------------------------------- SuperBill Details Patient Name: Date of Service: Jose [name] 03/07/2019 Medical Record [id number] Patient Account Number: [id number] Date of Birth/Sex: Treating RN: 1955-05-06 (63 y.o. 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 L76.34 Postprocedural seroma of skin and subcutaneous tissue following other procedure Facility Procedures CPT4 Code Description: [id number] 99213 - WOUND CARE VISIT-LEV 3 EST PT Modifier: 25 Quantity: 1 CPT4 Code Description: [id number]  11042 - DEB SUBQ TISSUE 20 SQ CM/< ICD-10 Diagnosis Description 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 Modifier: of muscle Quantity: 1 Physician Procedures CPT4: Code [id number] Description: 202 - WC PHYS  LEVEL 2 - NEW PT ICD-10 Diagnosis Description T81.31XD Disruption of external operation (surgical) wound, not else subsequent encounter 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

## 2019-03-14 NOTE — Progress Notes (Addendum)
ANTICOAGULATION CONSULT NOTE - Initial Consult  Pharmacy Consult for Heparin Indication: Antiphospholipid Syndrome  Allergies  Allergen Reactions  . Melatonin Anxiety  . Ramipril     cough    Patient Measurements: Weight: 240 lb (108.9 kg)   Vital Signs: BP: 142/55 (12/29 1630) Pulse Rate: 89 (12/29 1630)  Labs: Recent Labs    03/13/19 1710 03/14/19 1636  HGB 13.0  --   HCT 39.6  --   PLT 353  --   LABPROT 22.6* 18.3*  INR 2.0* 1.5*  CREATININE 0.86  --     Estimated Creatinine Clearance: 105.2 mL/min (by C-G formula based on SCr of 0.86 mg/dL).   Medical History: Past Medical History:  Diagnosis Date  . Antiphospholipid antibody syndrome ([place])   . CAD S/P percutaneous coronary angioplasty '98, '04, Feb 2017   [facility]  . Carotid arterial disease ([place]) 10/2015   moderate bilateral  . COPD (chronic obstructive pulmonary disease) ([place])   . Coronary artery disease   . Hypertension   . Non-insulin treated type 2 diabetes mellitus ([place])   . Pulmonary emboli ([place]) 2011  . RBBB   . Sleep apnea     Assessment: 63 y.o. male with medical history significant of antiphospholipid antibody syndrome on Coumadin with an INR goal of 2.5-3.5, COPD, type 2 diabetes not on insulin, sleep apnea, hypertension and a chronic leg wound.CT scan of his thigh was obtained that shows a thick walled abscess in the medial distal thigh communicating with an open wound on the skin with surrounding cellulitis.  Pharmacy consulted for heparin bridge therapy for possible surgery when INR < 2.  INR this pm 1.5.   Goal of Therapy:  Heparin level 0.3-0.7 units/ml Monitor platelets by anticoagulation protocol: Yes   Plan:  Start heparin infusion at 1750 units/hr Check anti-Xa level in 6 hours and daily while on heparin Continue to monitor H&H and platelets  Alanda Slim, PharmD, [facility] Clinical Pharmacist Please see AMION for all Pharmacists' Contact Phone Numbers 03/14/2019, 6:07 PM

## 2019-03-14 NOTE — H&P (Signed)
History and Physical    Jose [name] WUJ:[id number] DOB: 10-07-1955 DOA: 03/13/2019  I have briefly reviewed the patient's prior medical records in [facility]  PCP: Elenore Paddy, MD  Patient coming from: home  Chief Complaint: wound with discharge  HPI: Jose [name] is a 63 y.o. male with medical history significant of antiphospholipid antibody syndrome on Coumadin with an INR goal of 2.5-3.5, COPD, type 2 diabetes not on insulin, sleep apnea, hypertension and a chronic leg wound.  Per patient he has had this leg wound for the last 2 years but over the last week he has had increasing pain, redness, and copious discharge.  He has seen Dr. Dellia Nims at a wound care center and had his wound culture which grew staph Lugdunensis.  His wound care physician called him in a course of doxycycline but patient has not yet started because he is concerned that he needs IV antibiotics and his inability to manage the drainage.  He denies fever.  In the ER a CT scan of his thigh was obtained that shows a thick walled abscess in the medial distal thigh communicating with an open wound on the skin with surrounding cellulitis.  Patient was initially seen by the general surgery team in the ER who recommended an orthopedic surgery consult given location and proximity to the knee joint.  Patient was seen by orthopedics in the ER and initial recommendations are for ABIs.  If his ABIs are within normal limits the plan is to operate for a washout and wound VAC placement.  Patient's situation is complicated by his need for Coumadin and the need for a heparin bridge.    Review of Systems: As per HPI otherwise 10 point review of systems negative.   Past Medical History:  Diagnosis Date  . Antiphospholipid antibody syndrome ([place])   . CAD S/P percutaneous coronary angioplasty '98, '04, Feb 2017   [facility]  . Carotid arterial disease ([place]) 10/2015   moderate bilateral  . COPD (chronic obstructive pulmonary disease)  ([address] [place])   . Coronary artery disease   . Hypertension   . Non-insulin treated type 2 diabetes mellitus ([place])   . Pulmonary emboli ([place]) 2011  . RBBB   . Sleep apnea     Past Surgical History:  Procedure Laterality Date  . APPENDECTOMY    . BACK SURGERY  '89, '06  . COLONOSCOPY    . CORONARY ANGIOPLASTY WITH STENT PLACEMENT  '98, '04, Feb 2017     reports that he has quit smoking. His smoking use included cigarettes. He smoked 0.50 packs per day. He has never used smokeless tobacco. He reports that he does not drink alcohol or use drugs.  Allergies  Allergen Reactions  . Melatonin Anxiety  . Ramipril     cough    Family History  Problem Relation Age of Onset  . CAD Father 64    Prior to Admission medications   Medication Sig Start Date End Date Taking? Authorizing Provider  albuterol (PROVENTIL) (5 MG/ML) 0.5% nebulizer solution Take 0.5 mLs (2.5 mg total) by nebulization every 6 (six) hours as needed for wheezing or shortness of breath. 12/27/18   Wyvonnia Dusky, MD  ALPRAZolam Duanne Moron) 0.5 MG tablet TAKE ONE TABLET (0.5 MG TOTAL) BY MOUTH 3 (THREE) TIMES A DAY AS NEEDED FOR SLEEP OR ANXIETY. 09/13/17   [provider]  aspirin EC 81 MG tablet Take 81 mg by mouth daily.    [provider]  Blood Glucose Monitoring  Suppl (GLUCOCOM BLOOD GLUCOSE MONITOR) DEVI 1 each by XX route as directed (For Blood Glucose Monitoring) 08/23/17   [provider]  colchicine 0.6 MG tablet Take 1 tablet (0.6 mg total) by mouth 2 (two) times daily. Patient not taking: Reported on 10/04/2017 02/12/17   Shon Hale, MD  furosemide (LASIX) 40 MG tablet Take 1 tablet (40 mg total) by mouth daily. Patient not taking: Reported on 10/04/2017 02/12/17   Shon Hale, MD  isosorbide mononitrate (IMDUR) 60 MG 24 hr tablet Take 1 tablet (60 mg total) by mouth daily. Patient not taking: Reported on 10/04/2017 02/13/17   Shon Hale, MD  methylPREDNISolone (MEDROL DOSEPAK) 4  MG TBPK tablet Use as directed on package 12/27/18   Terald Sleeper, MD  metoprolol succinate (TOPROL-XL) 100 MG 24 hr tablet Take 100 mg by mouth daily. 08/04/16   [provider]  metoprolol tartrate (LOPRESSOR) 12.5 mg TABS tablet Take by mouth. 08/22/17 08/22/18  [provider]  nitroGLYCERIN (NITROSTAT) 0.4 MG SL tablet Place 0.4 mg under the tongue every 5 (five) minutes as needed for chest pain.  07/07/16   [provider]  pantoprazole (PROTONIX) 40 MG tablet Take 40 mg by mouth 2 (two) times daily. 06/03/16   [provider]  polyethylene glycol (MIRALAX / GLYCOLAX) packet Take 17 g by mouth daily. Patient not taking: Reported on 10/04/2017 02/12/17   Shon Hale, MD  potassium chloride (MICRO-K) 10 MEQ CR capsule Take by mouth. 08/22/17 08/22/18  [provider]  PROAIR HFA 108 (90 Base) MCG/ACT inhaler Inhale 1-2 puffs into the lungs every 4 (four) hours as needed for wheezing.  07/07/16   [provider]  rosuvastatin (CRESTOR) 10 MG tablet Take 10 mg by mouth every evening. 07/07/16   [provider]  tamsulosin (FLOMAX) 0.4 MG CAPS capsule Take 0.4 mg by mouth daily. 06/05/16   [provider]  Tiotropium Bromide-Olodaterol (STIOLTO RESPIMAT) 2.5-2.5 MCG/ACT AERS Inhale into the lungs. 09/10/17   [provider]  topiramate (TOPAMAX) 50 MG tablet Take 50 mg by mouth 2 (two) times daily. 06/05/16   [provider]  torsemide (DEMADEX) 20 MG tablet Take by mouth. 07/05/17 07/05/18  [provider]  warfarin (COUMADIN) 5 MG tablet Take 5-7.5 mg by mouth See admin instructions. Take 7.5 mg on Monday, Wednesday and Friday then take 5 mg all the other days 07/17/16   [provider]    Physical Exam: Vitals:   03/14/19 1100 03/14/19 1130 03/14/19 1200 03/14/19 1300  BP: (!) 93/35 (!) 105/49 (!) 109/54 109/61  Pulse: 83 81 82 82  Resp: 16  18 18   Temp:      TempSrc:      SpO2: 94% 94% 95%  94%  Weight:          Constitutional: NAD, calm, comfortable, obese male ENMT: Mucous membranes are moist.  Neck: normal, supple, no masses Respiratory:  Normal respiratory effort. No accessory muscle use.  Cardiovascular: Regular rate and rhythm, + LE edema Abdomen: no tenderness, no masses palpated. Bowel sounds positive.  Musculoskeletal: no clubbing / cyanosis. Normal muscle tone.  Skin: skin warm and dry, chronic changes on LE, small opening  (2x3) on left medial thigh with some redness surrounding - copious discharge Neurologic: moves all 4 ext Psychiatric: Normal judgment and insight. Alert and oriented x 3. Normal mood.   Labs on Admission: I have personally reviewed following labs and imaging studies  CBC: Recent Labs  Lab 03/13/19  1710  WBC 10.1  NEUTROABS 6.1  HGB 13.0  HCT 39.6  MCV 94.5  PLT 353   Basic Metabolic Panel: Recent Labs  Lab 03/13/19 1710  NA 140  K 3.8  CL 104  CO2 24  GLUCOSE 113*  BUN 15  CREATININE 0.86  CALCIUM 9.2   GFR: Estimated Creatinine Clearance: 105.2 mL/min (by C-G formula based on SCr of 0.86 mg/dL). Liver Function Tests: Recent Labs  Lab 03/13/19 1710  AST 26  ALT 24  ALKPHOS 52  BILITOT 0.2*  PROT 6.7  ALBUMIN 3.2*   No results for input(s): LIPASE, AMYLASE in the last 168 hours. No results for input(s): AMMONIA in the last 168 hours. Coagulation Profile: Recent Labs  Lab 03/13/19 1710  INR 2.0*   Cardiac Enzymes: No results for input(s): CKTOTAL, CKMB, CKMBINDEX, TROPONINI in the last 168 hours. BNP (last 3 results) No results for input(s): PROBNP in the last 8760 hours. HbA1C: No results for input(s): HGBA1C in the last 72 hours. CBG: No results for input(s): GLUCAP in the last 168 hours. Lipid Profile: No results for input(s): CHOL, HDL, LDLCALC, TRIG, CHOLHDL, LDLDIRECT in the last 72 hours. Thyroid Function Tests: No results for input(s): TSH, T4TOTAL, FREET4, T3FREE, THYROIDAB in the last 72  hours. Anemia Panel: No results for input(s): VITAMINB12, FOLATE, FERRITIN, TIBC, IRON, RETICCTPCT in the last 72 hours. Urine analysis: No results found for: COLORURINE, APPEARANCEUR, LABSPEC, PHURINE, GLUCOSEU, HGBUR, BILIRUBINUR, KETONESUR, PROTEINUR, UROBILINOGEN, NITRITE, LEUKOCYTESUR   Radiological Exams on Admission: CT FEMUR LEFT W CONTRAST  Result Date: 03/14/2019 CLINICAL DATA:  Thigh pain and swelling. EXAM: CT OF THE LOWER RIGHT EXTREMITY WITH CONTRAST TECHNIQUE: Multidetector CT imaging of the lower right extremity was performed according to the standard protocol following intravenous contrast administration. COMPARISON:  None. CONTRAST:  OMNIPAQUE IOHEXOL 300 MG/ML  SOLN FINDINGS: Skin thickening and subcutaneous soft tissue swelling/edema throughout the entire left thigh consistent with cellulitis. More distally there is a discrete thick-walled abscess communicating with an open wound on the skin. This contains fluid and gas. It is located distally just above the knee joint medially. This measures a maximum of 10.3 x 6.6 x 2.4 cm. This is superficial to the sartorius muscle. I do not see any definite muscle involvement. The muscular compartment structures appear normal. No findings for myofasciitis or pyomyositis. There is a nearby surgical clip likely due to vein harvesting from bypass surgery 2 years ago. The hip and knee joints are maintained. I do not see any findings suspicious for septic arthritis. The femur is intact. No evidence of osteomyelitis. The visualized bony pelvis is intact. SI joint degenerative changes are noted and mild hip joint degenerative changes. The major vascular structures are patent. Scattered atherosclerotic calcifications are noted. No evidence of deep venous thrombosis. No significant intrapelvic abnormalities are identified. No inguinal mass or adenopathy. IMPRESSION: 1. 10 x 6.5 x 2.5 cm thick walled abscess involving the medial distal thigh and  communicating with an open wound on the skin. 2. Diffuse cellulitis but no findings for myofasciitis, pyomyositis, septic arthritis or osteomyelitis. Electronically Signed   By: Rudie Meyer M.D.   On: 03/14/2019 09:24      Assessment/Plan Active Problems:   Anti-phospholipid syndrome (HCC)   Essential hypertension, benign   COPD (chronic obstructive pulmonary disease) (HCC)   Sleep apnea   CAD S/P multiple PCIs   Chronic anticoagulation-Couamdin   Non-insulin treated type 2 diabetes mellitus (HCC)   Abscess   Complex leg  wound with abscess -Left thigh -CT scan shows a thickened walled abscess with a communicating opening to the skin and cellulitis. -Neurosurgery has deferred to orthopedics due to location -Plan for ABI to evaluate blood flow: If abnormal he will need a vascular surgery consult -Patient will most likely need a formal I&D as well as wound VAC placement -He may need a plastics consult as well pending course -IV vancomycin  Morbid obesity Estimated body mass index is 36.49 kg/m as calculated from the following:   Height as of 12/27/18: 5\' 8"  (1.727 m).   Weight as of this encounter: 108.9 kg.   Type II diabetes: not on insulin -Hemoglobin A1c for the a.m. -Sliding scale insulin  Antiphospholipid syndrome -On Coumadin with a goal of 2.5-3.5 -Currently patient has an INR of 2 so we will bridge with heparin  Chronic respiratory failure due to COPD -Wears 2L at home  Hypertension -Resume home medications as tolerated    DVT prophylaxis: heparin gtt once INR <2.5 Code Status: full  Disposition Plan:  Consults called: orthopedics and general surgery     Admission status:    At the time of admission, it appears that the appropriate admission status for this patient is INPATIENT. This is judged to be reasonable and necessary in order to provide the required high service intensity to ensure the patient's safety given the presenting symptoms, physical  exam findings, and initial radiographic and laboratory data in the context of their chronic comorbidities. Current circumstances are an abscess and need for surgical intervention along with heparin bridge and it is felt to place patient at high risk for further clinical deterioration threatening life, limb, or organ. Moreover, it is my clinical judgment that the patient will require inpatient hospital care spanning beyond 2 midnights from the point of admission and that early discharge would result in unnecessary risk of decompensation and readmission or threat to life, limb or bodily function.   Joseph ArtJessica U Deshanae Lindo [facility]   How to contact the [facility] Attending or Consulting provider 7A - 7P or covering provider during after hours 7P -7A, for this patient?  1. Check the care team in [facility] and look for a) attending/consulting [facility] provider listed and b) the [facility] team listed 2. Log into [email or web address] and use [facility] universal password to access. If you do not have the password, please contact the hospital operator. 3. Locate the [facility] provider you are looking for under [facility] and page to a number that you can be directly reached. 4. If you still have difficulty reaching the provider, please page the [facility] (Director on Call) for the Hospitalists listed on amion for assistance.  03/14/2019, 4:14 PM

## 2019-03-14 NOTE — ED Notes (Signed)
Pt states he only wanted to take Demadex 20mg  d/t "I'll be up peeing all night if I take it this late". Pt given urinal.

## 2019-03-15 ENCOUNTER — Encounter (HOSPITAL_COMMUNITY)
Admission: EM | Disposition: A | Discharge status: Home Health | Payer: Self-pay | Source: Home / Self Care | Attending: Internal Medicine

## 2019-03-15 ENCOUNTER — Inpatient Hospital Stay (HOSPITAL_COMMUNITY): Payer: BC Managed Care – PPO

## 2019-03-15 ENCOUNTER — Inpatient Hospital Stay (HOSPITAL_COMMUNITY): Payer: BC Managed Care – PPO | Admitting: Anesthesiology

## 2019-03-15 ENCOUNTER — Encounter (HOSPITAL_COMMUNITY): Payer: Self-pay | Admitting: Internal Medicine

## 2019-03-15 ENCOUNTER — Other Ambulatory Visit: Payer: Self-pay | Admitting: Physician Assistant

## 2019-03-15 DIAGNOSIS — L02416 Cutaneous abscess of left lower limb: Secondary | ICD-10-CM

## 2019-03-15 HISTORY — PX: I & D EXTREMITY: SHX5045

## 2019-03-15 LAB — GLUCOSE, CAPILLARY
Glucose-Capillary: 125 mg/dL — ABNORMAL HIGH (ref 70–99)
Glucose-Capillary: 106 mg/dL — ABNORMAL HIGH (ref 70–99)
Glucose-Capillary: 97 mg/dL (ref 70–99)
Glucose-Capillary: 113 mg/dL — ABNORMAL HIGH (ref 70–99)
Glucose-Capillary: 178 mg/dL — ABNORMAL HIGH (ref 70–99)

## 2019-03-15 LAB — HEPARIN LEVEL (UNFRACTIONATED)
Heparin Unfractionated: 0.22 IU/mL — ABNORMAL LOW (ref 0.30–0.70)
Heparin Unfractionated: <0.1 IU/mL — ABNORMAL LOW (ref 0.30–0.70)

## 2019-03-15 LAB — CBC
HCT: 39.7 % (ref 39.0–52.0)
Hemoglobin: 13 g/dL (ref 13.0–17.0)
MCH: 31.3 pg (ref 26.0–34.0)
MCHC: 32.7 g/dL (ref 30.0–36.0)
MCV: 95.4 fL (ref 80.0–100.0)
Platelets: 330 10*3/uL (ref 150–400)
RBC: 4.16 MIL/uL — ABNORMAL LOW (ref 4.22–5.81)
RDW: 14.8 % (ref 11.5–15.5)
WBC: 9.6 10*3/uL (ref 4.0–10.5)
nRBC: 0 % (ref 0.0–0.2)

## 2019-03-15 LAB — BASIC METABOLIC PANEL
Anion gap: 11 (ref 5–15)
BUN: 9 mg/dL (ref 8–23)
CO2: 28 mmol/L (ref 22–32)
Calcium: 8.7 mg/dL — ABNORMAL LOW (ref 8.9–10.3)
Chloride: 101 mmol/L (ref 98–111)
Creatinine, Ser: 0.75 mg/dL (ref 0.61–1.24)
GFR calc Af Amer: >60 mL/min (ref >60)
GFR calc non Af Amer: >60 mL/min (ref >60)
Glucose, Bld: 122 mg/dL — ABNORMAL HIGH (ref 70–99)
Potassium: 3.4 mmol/L — ABNORMAL LOW (ref 3.5–5.1)
Sodium: 140 mmol/L (ref 135–145)

## 2019-03-15 LAB — SURGICAL PCR SCREEN
MRSA, PCR: NEGATIVE
Staphylococcus aureus: NEGATIVE

## 2019-03-15 LAB — HEMOGLOBIN A1C
Hgb A1c MFr Bld: 6.9 % — ABNORMAL HIGH (ref 4.8–5.6)
Mean Plasma Glucose: 151.33 mg/dL

## 2019-03-15 LAB — SARS CORONAVIRUS 2 (TAT 6-24 HRS): SARS Coronavirus 2: NEGATIVE

## 2019-03-15 SURGERY — IRRIGATION AND DEBRIDEMENT EXTREMITY
Anesthesia: General | Laterality: Left

## 2019-03-15 MED ORDER — LIDOCAINE 2% (20 MG/ML) 5 ML SYRINGE
INTRAMUSCULAR | Status: AC
  Filled 2019-03-15: qty 5

## 2019-03-15 MED ORDER — TIOTROPIUM BROMIDE-OLODATEROL 2.5-2.5 MCG/ACT IN AERS
2.0000 | INHALATION_SPRAY | Freq: Every day | RESPIRATORY_TRACT | Status: DC
  Administered 2019-03-19 – 2019-03-22 (×2): 2 via RESPIRATORY_TRACT

## 2019-03-15 MED ORDER — PROMETHAZINE HCL 25 MG/ML IJ SOLN: 6.2500 mg | INTRAMUSCULAR | Status: DC | PRN

## 2019-03-15 MED ORDER — PROPOFOL 10 MG/ML IV BOLUS
INTRAVENOUS | Status: DC | PRN
  Administered 2019-03-15 (×2): 50 mg via INTRAVENOUS
  Administered 2019-03-15: 150 mg via INTRAVENOUS
  Administered 2019-03-15: 50 mg via INTRAVENOUS

## 2019-03-15 MED ORDER — ONDANSETRON HCL 4 MG/2ML IJ SOLN
INTRAMUSCULAR | Status: DC | PRN
  Administered 2019-03-15: 4 mg via INTRAVENOUS

## 2019-03-15 MED ORDER — 0.9 % SODIUM CHLORIDE (POUR BTL) OPTIME
TOPICAL | Status: DC | PRN
  Administered 2019-03-15 (×2): 1000 mL

## 2019-03-15 MED ORDER — FENTANYL CITRATE (PF) 100 MCG/2ML IJ SOLN
INTRAMUSCULAR | Status: DC | PRN
  Administered 2019-03-15 (×3): 50 ug via INTRAVENOUS

## 2019-03-15 MED ORDER — PROPOFOL 10 MG/ML IV BOLUS
INTRAVENOUS | Status: AC
  Filled 2019-03-15: qty 20

## 2019-03-15 MED ORDER — ASPIRIN 325 MG PO TABS
325.0000 mg | ORAL_TABLET | Freq: Every day | ORAL | Status: DC
  Administered 2019-03-15 – 2019-03-20 (×6): 325 mg via ORAL
  Filled 2019-03-15 (×6): qty 1

## 2019-03-15 MED ORDER — HYDROMORPHONE HCL 1 MG/ML IJ SOLN
INTRAMUSCULAR | Status: AC
  Filled 2019-03-15: qty 1

## 2019-03-15 MED ORDER — ACETAMINOPHEN 10 MG/ML IV SOLN: 1000.0000 mg | Freq: Once | INTRAVENOUS | Status: DC | PRN

## 2019-03-15 MED ORDER — OXYCODONE HCL 5 MG/5ML PO SOLN: 5.0000 mg | Freq: Once | ORAL | Status: AC | PRN

## 2019-03-15 MED ORDER — DOCUSATE SODIUM 100 MG PO CAPS
100.0000 mg | ORAL_CAPSULE | Freq: Two times a day (BID) | ORAL | Status: DC
  Administered 2019-03-15 – 2019-03-17 (×5): 100 mg via ORAL
  Filled 2019-03-15 (×5): qty 1

## 2019-03-15 MED ORDER — METOCLOPRAMIDE HCL 5 MG PO TABS: 5.0000 mg | ORAL_TABLET | Freq: Three times a day (TID) | ORAL | Status: DC | PRN

## 2019-03-15 MED ORDER — OXYCODONE HCL 5 MG PO TABS
5.0000 mg | ORAL_TABLET | Freq: Once | ORAL | Status: AC | PRN
  Administered 2019-03-15: 5 mg via ORAL

## 2019-03-15 MED ORDER — LACTATED RINGERS IV SOLN: INTRAVENOUS | Status: DC

## 2019-03-15 MED ORDER — OXYCODONE HCL 5 MG PO TABS
5.0000 mg | ORAL_TABLET | ORAL | Status: DC | PRN
  Administered 2019-03-15 – 2019-03-22 (×9): 10 mg via ORAL
  Filled 2019-03-15 (×9): qty 2

## 2019-03-15 MED ORDER — ACETAMINOPHEN 500 MG PO TABS: 1000.0000 mg | ORAL_TABLET | Freq: Once | ORAL | Status: DC

## 2019-03-15 MED ORDER — METOCLOPRAMIDE HCL 5 MG/ML IJ SOLN: 5.0000 mg | Freq: Three times a day (TID) | INTRAMUSCULAR | Status: DC | PRN

## 2019-03-15 MED ORDER — OXYCODONE HCL 5 MG PO TABS
ORAL_TABLET | ORAL | Status: AC
  Filled 2019-03-15: qty 1

## 2019-03-15 MED ORDER — HYDROMORPHONE HCL 1 MG/ML IJ SOLN
0.5000 mg | INTRAMUSCULAR | Status: DC | PRN
  Administered 2019-03-15 – 2019-03-20 (×18): 0.5 mg via INTRAVENOUS
  Filled 2019-03-15 (×18): qty 1

## 2019-03-15 MED ORDER — FENTANYL CITRATE (PF) 250 MCG/5ML IJ SOLN
INTRAMUSCULAR | Status: AC
  Filled 2019-03-15: qty 5

## 2019-03-15 MED ORDER — CEFAZOLIN SODIUM-DEXTROSE 2-4 GM/100ML-% IV SOLN
2.0000 g | INTRAVENOUS | Status: AC
  Administered 2019-03-15: 2 g via INTRAVENOUS
  Filled 2019-03-15 (×2): qty 100

## 2019-03-15 MED ORDER — LIDOCAINE HCL (CARDIAC) PF 100 MG/5ML IV SOSY
PREFILLED_SYRINGE | INTRAVENOUS | Status: DC | PRN
  Administered 2019-03-15: 100 mg via INTRAVENOUS

## 2019-03-15 MED ORDER — PROPOFOL 10 MG/ML IV BOLUS
INTRAVENOUS | Status: AC
  Filled 2019-03-15: qty 40

## 2019-03-15 MED ORDER — HYDROMORPHONE HCL 1 MG/ML IJ SOLN
0.2500 mg | INTRAMUSCULAR | Status: DC | PRN
  Administered 2019-03-15 (×2): 0.5 mg via INTRAVENOUS

## 2019-03-15 MED ORDER — CHLORHEXIDINE GLUCONATE 4 % EX LIQD: 60.0000 mL | Freq: Once | CUTANEOUS | Status: DC

## 2019-03-15 SURGICAL SUPPLY — 38 items
BENZOIN TINCTURE PRP APPL 2/3 (GAUZE/BANDAGES/DRESSINGS) ×6 IMPLANT
BLADE SURG 21 STRL SS (BLADE) ×3 IMPLANT
BNDG COHESIVE 6X5 TAN STRL LF (GAUZE/BANDAGES/DRESSINGS) ×3 IMPLANT
BNDG GAUZE ELAST 4 BULKY (GAUZE/BANDAGES/DRESSINGS) IMPLANT
CANISTER WOUNDNEG PRESSURE 500 (CANNISTER) ×3 IMPLANT
COVER SURGICAL LIGHT HANDLE (MISCELLANEOUS) ×3 IMPLANT
COVER WAND RF STERILE (DRAPES) IMPLANT
DRAPE INCISE IOBAN 66X45 STRL (DRAPES) ×3 IMPLANT
DRAPE U-SHAPE 47X51 STRL (DRAPES) ×3 IMPLANT
DRESSING VERAFLO CLEANSE CC (GAUZE/BANDAGES/DRESSINGS) ×1 IMPLANT
DRSG ADAPTIC 3X8 NADH LF (GAUZE/BANDAGES/DRESSINGS) IMPLANT
DRSG VERAFLO CLEANSE CC (GAUZE/BANDAGES/DRESSINGS) ×3
DURAPREP 26ML APPLICATOR (WOUND CARE) ×3 IMPLANT
ELECT REM PT RETURN 9FT ADLT (ELECTROSURGICAL)
ELECTRODE REM PT RTRN 9FT ADLT (ELECTROSURGICAL) IMPLANT
GAUZE SPONGE 4X4 12PLY STRL (GAUZE/BANDAGES/DRESSINGS) IMPLANT
GLOVE BIOGEL PI IND STRL 9 (GLOVE) ×1 IMPLANT
GLOVE BIOGEL PI INDICATOR 9 (GLOVE) ×2
GLOVE SURG ORTHO 9.0 STRL STRW (GLOVE) ×3 IMPLANT
GOWN STRL REUS W/ TWL XL LVL3 (GOWN DISPOSABLE) ×2 IMPLANT
GOWN STRL REUS W/TWL XL LVL3 (GOWN DISPOSABLE) ×4
HANDPIECE INTERPULSE COAX TIP (DISPOSABLE)
KIT BASIN OR (CUSTOM PROCEDURE TRAY) ×3 IMPLANT
KIT TURNOVER KIT B (KITS) ×3 IMPLANT
MANIFOLD NEPTUNE II (INSTRUMENTS) ×3 IMPLANT
NS IRRIG 1000ML POUR BTL (IV SOLUTION) ×3 IMPLANT
PACK ORTHO EXTREMITY (CUSTOM PROCEDURE TRAY) ×3 IMPLANT
PAD ARMBOARD 7.5X6 YLW CONV (MISCELLANEOUS) IMPLANT
PAD NEG PRESSURE SENSATRAC (MISCELLANEOUS) ×3 IMPLANT
SET HNDPC FAN SPRY TIP SCT (DISPOSABLE) IMPLANT
STOCKINETTE IMPERVIOUS 9X36 MD (GAUZE/BANDAGES/DRESSINGS) ×3 IMPLANT
SUT ETHILON 2 0 PSLX (SUTURE) ×6 IMPLANT
SWAB COLLECTION DEVICE MRSA (MISCELLANEOUS) IMPLANT
SWAB CULTURE ESWAB REG 1ML (MISCELLANEOUS) IMPLANT
TOWEL GREEN STERILE (TOWEL DISPOSABLE) ×3 IMPLANT
TUBE CONNECTING 12'X1/4 (SUCTIONS) ×1
TUBE CONNECTING 12X1/4 (SUCTIONS) ×2 IMPLANT
YANKAUER SUCT BULB TIP NO VENT (SUCTIONS) ×3 IMPLANT

## 2019-03-15 NOTE — Progress Notes (Signed)
Progress Note   Renso Swett ZDG:[id number] DOB: Jan 09, 1956 DOA: 03/13/2019  I have briefly reviewed the patient's prior medical records in [facility]  PCP: [name], MD  Patient coming from: home  Chief Complaint: wound with discharge  HPI: Jose [name] is a 63 y.o. male with medical history significant of antiphospholipid antibody syndrome on Coumadin with an INR goal of 2.5-3.5, COPD, type 2 diabetes not on insulin, sleep apnea, hypertension and a chronic leg wound.  Per patient he has had this leg wound for the last 2 years but over the last week he has had increasing pain, redness, and copious discharge.  He has seen Dr. Leanord Hawking at a wound care center and had his wound culture which grew staph Lugdunensis.  His wound care physician called him in a course of doxycycline but patient has not yet started because he is concerned that he needs IV antibiotics and his inability to manage the drainage.  He denies fever.  In the ER a CT scan of his thigh was obtained that shows a thick walled abscess in the medial distal thigh communicating with an open wound on the skin with surrounding cellulitis.  Patient was initially seen by the general surgery team in the ER who recommended an orthopedic surgery consult given location and proximity to the knee joint.  Patient was seen by orthopedics in the ER and initial recommendations are for ABIs.  If his ABIs are within normal limits the plan is to operate for a washout and wound VAC placement.  Patient's situation is complicated by his need for Coumadin and the need for a heparin bridge.  Subjective: No acute issue chills or events overnight, pain well controlled, currently only complaint is hunger.  Denies fever, chills, nausea, vomiting, diarrhea, constipation, headache.   Assessment/Plan Active Problems:   Anti-phospholipid syndrome (HCC)   Essential hypertension, benign   COPD (chronic obstructive pulmonary disease) (HCC)   Sleep apnea   CAD S/P multiple PCIs   Chronic anticoagulation-Couamdin   Non-insulin treated type 2 diabetes mellitus (HCC)   Abscess   Acute on chronic complex leg wound with abscess, POA -Left medial thigh, does not meet sepsis criteria -CT scan shows a thickened walled abscess with a communicating opening to the skin and cellulitis. -Orthopedics following, appreciate insight and recommendations -ABI WNL - no need for further vascular workup -Currently pending likely I&D with questionable need for wound VAC per initial note -Currently on vancomycin, will de-escalate pending cultures.  Morbid obesity Estimated body mass index is 36.49 kg/m as calculated from the following:   Height as of 12/27/18: 5\' 8"  (1.727 m).   Weight as of this encounter: 108.9 kg.   Type II diabetes: not on insulin -Hemoglobin A1c 6.9 -Sliding scale insulin  Antiphospholipid syndrome -On Coumadin with a goal of 2.5-3.5 Lab Results  Component Value Date   INR 1.5 (H) 03/14/2019   INR 2.0 (H) 03/13/2019   INR 2.0 (H) 12/27/2018   Chronic respiratory failure due to COPD, without acute exacerbation -Wears 2L at home  Hypertension -Resume home medications as tolerated -Currently holding home medications given current blood pressure  DVT prophylaxis: heparin gtt while INR <2.5 Code Status: full  Disposition Plan:  Consults called: orthopedics and general surgery    Past Medical History:  Diagnosis Date  . Antiphospholipid antibody syndrome (HCC)   . CAD S/P percutaneous coronary angioplasty '98, '04, Feb 2017   [facility]  . Carotid arterial disease (HCC) 10/2015   moderate bilateral  .  COPD (chronic obstructive pulmonary disease) ([place])   . Coronary artery disease   . Hypertension   . Non-insulin treated type 2 diabetes mellitus ([place])   . Pulmonary emboli ([place]) 2011  . RBBB   . Sleep apnea     Past Surgical History:  Procedure Laterality Date  . APPENDECTOMY    . BACK SURGERY  '89, '06  . COLONOSCOPY     . CORONARY ANGIOPLASTY WITH STENT PLACEMENT  '98, '04, Feb 2017     reports that he has quit smoking. His smoking use included cigarettes. He smoked 0.50 packs per day. He has never used smokeless tobacco. He reports that he does not drink alcohol or use drugs.  Allergies  Allergen Reactions  . Melatonin Anxiety  . Ramipril     cough    Family History  Problem Relation Age of Onset  . CAD Father 15    Prior to Admission medications   Medication Sig Start Date End Date Taking? Authorizing Provider  albuterol (PROVENTIL) (5 MG/ML) 0.5% nebulizer solution Take 0.5 mLs (2.5 mg total) by nebulization every 6 (six) hours as needed for wheezing or shortness of breath. 12/27/18   Wyvonnia Dusky, MD  ALPRAZolam Duanne Moron) 0.5 MG tablet TAKE ONE TABLET (0.5 MG TOTAL) BY MOUTH 3 (THREE) TIMES A DAY AS NEEDED FOR SLEEP OR ANXIETY. 09/13/17   [provider]  aspirin EC 81 MG tablet Take 81 mg by mouth daily.    [provider]  Blood Glucose Monitoring Suppl (GLUCOCOM BLOOD GLUCOSE MONITOR) [place] 1 each by XX route as directed (For Blood Glucose Monitoring) 08/23/17   [provider]  colchicine 0.6 MG tablet Take 1 tablet (0.6 mg total) by mouth 2 (two) times daily. Patient not taking: Reported on 10/04/2017 02/12/17   Roxan Hockey, MD  furosemide (LASIX) 40 MG tablet Take 1 tablet (40 mg total) by mouth daily. Patient not taking: Reported on 10/04/2017 02/12/17   Roxan Hockey, MD  isosorbide mononitrate (IMDUR) 60 MG 24 hr tablet Take 1 tablet (60 mg total) by mouth daily. Patient not taking: Reported on 10/04/2017 02/13/17   Roxan Hockey, MD  methylPREDNISolone (MEDROL DOSEPAK) 4 MG TBPK tablet Use as directed on package 12/27/18   Wyvonnia Dusky, MD  metoprolol succinate (TOPROL-XL) 100 MG 24 hr tablet Take 100 mg by mouth daily. 08/04/16   [provider]  metoprolol tartrate (LOPRESSOR) 12.5 mg TABS tablet Take by mouth. 08/22/17 08/22/18  [provider]  nitroGLYCERIN (NITROSTAT) 0.4 MG SL tablet Place 0.4 mg under the tongue every 5 (five) minutes as needed for chest pain.  07/07/16   [provider]  pantoprazole (PROTONIX) 40 MG tablet Take 40 mg by mouth 2 (two) times daily. 06/03/16   [provider]  polyethylene glycol (MIRALAX / GLYCOLAX) packet Take 17 g by mouth daily. Patient not taking: Reported on 10/04/2017 02/12/17   Roxan Hockey, MD  potassium chloride (MICRO-K) 10 MEQ CR capsule Take by mouth. 08/22/17 08/22/18  [provider]  PROAIR HFA 108 (90 Base) MCG/ACT inhaler Inhale 1-2 puffs into the lungs every 4 (four) hours as needed for wheezing.  07/07/16   [provider]  rosuvastatin (CRESTOR) 10 MG tablet Take 10 mg by mouth every evening. 07/07/16   [provider]  tamsulosin (FLOMAX) 0.4 MG CAPS capsule Take 0.4 mg by mouth daily. 06/05/16   [provider]  Tiotropium Bromide-Olodaterol (STIOLTO RESPIMAT) 2.5-2.5 MCG/ACT AERS Inhale into the lungs. 09/10/17  [provider]  topiramate (TOPAMAX) 50 MG tablet Take 50 mg by mouth 2 (two) times daily. 06/05/16   [provider]  torsemide (DEMADEX) 20 MG tablet Take by mouth. 07/05/17 07/05/18  [provider]  warfarin (COUMADIN) 5 MG tablet Take 5-7.5 mg by mouth See admin instructions. Take 7.5 mg on Monday, Wednesday and Friday then take 5 mg all the other days 07/17/16   [provider]    Physical Exam: Vitals:   03/14/19 1630 03/14/19 1900 03/14/19 2106 03/15/19 0654  BP: (!) 142/55 (!) 115/50 128/73 (!) 114/59  Pulse: 89 85 93 91  Resp: 18 18 16 18   Temp:  98.7 F (37.1 C) 98.5 F (36.9 C) 98.4 F (36.9 C)  TempSrc:   Oral Oral  SpO2: 97% 98% 99% 94%  Weight:          Constitutional: NAD, calm, comfortable, obese male ENMT: Mucous membranes are moist.  Neck: normal, supple, no masses Respiratory:  Normal respiratory effort. No accessory muscle use.    Cardiovascular: Regular rate and rhythm, + LE edema Abdomen: no tenderness, no masses palpated. Bowel sounds positive.  Musculoskeletal: no clubbing / cyanosis. Normal muscle tone.  Skin: skin warm and dry, chronic changes on LE, small opening  (2x3) on left medial thigh with some redness surrounding - copious discharge Neurologic: moves all 4 ext without deficit  Labs on Admission: I have personally reviewed following labs and imaging studies  CBC: Recent Labs  Lab 03/13/19 1710 03/15/19 0451  WBC 10.1 9.6  NEUTROABS 6.1  --   HGB 13.0 13.0  HCT 39.6 39.7  MCV 94.5 95.4  PLT 353 330   Basic Metabolic Panel: Recent Labs  Lab 03/13/19 1710 03/15/19 0451  NA 140 140  K 3.8 3.4*  CL 104 101  CO2 24 28  GLUCOSE 113* 122*  BUN 15 9  CREATININE 0.86 0.75  CALCIUM 9.2 8.7*   GFR: Estimated Creatinine Clearance: 113.1 mL/min (by C-G formula based on SCr of 0.75 mg/dL). Liver Function Tests: Recent Labs  Lab 03/13/19 1710  AST 26  ALT 24  ALKPHOS 52  BILITOT 0.2*  PROT 6.7  ALBUMIN 3.2*   No results for input(s): LIPASE, AMYLASE in the last 168 hours. No results for input(s): AMMONIA in the last 168 hours. Coagulation Profile: Recent Labs  Lab 03/13/19 1710 03/14/19 1636  INR 2.0* 1.5*   Cardiac Enzymes: No results for input(s): CKTOTAL, CKMB, CKMBINDEX, TROPONINI in the last 168 hours. BNP (last 3 results) No results for input(s): PROBNP in the last 8760 hours. HbA1C: Recent Labs    03/15/19 0451  HGBA1C 6.9*   CBG: Recent Labs  Lab 03/14/19 2100  GLUCAP 120*   Lipid Profile: No results for input(s): CHOL, HDL, LDLCALC, TRIG, CHOLHDL, LDLDIRECT in the last 72 hours. Thyroid Function Tests: No results for input(s): TSH, T4TOTAL, FREET4, T3FREE, THYROIDAB in the last 72 hours. Anemia Panel: No results for input(s): VITAMINB12, FOLATE, FERRITIN, TIBC, IRON, RETICCTPCT in the last 72 hours. Urine analysis: No results found for: COLORURINE,  APPEARANCEUR, LABSPEC, PHURINE, GLUCOSEU, HGBUR, BILIRUBINUR, KETONESUR, PROTEINUR, UROBILINOGEN, NITRITE, LEUKOCYTESUR   Radiological Exams on Admission: CT FEMUR LEFT W CONTRAST  Result Date: 03/14/2019 CLINICAL DATA:  Thigh pain and swelling. EXAM: CT OF THE LOWER RIGHT EXTREMITY WITH CONTRAST TECHNIQUE: Multidetector CT imaging of the lower right extremity was performed according to the standard protocol following intravenous contrast administration. COMPARISON:  None. CONTRAST:  100mL OMNIPAQUE IOHEXOL 300 MG/ML  SOLN FINDINGS: Skin thickening and subcutaneous soft tissue swelling/edema throughout the entire left thigh consistent with cellulitis. More distally there is a discrete thick-walled abscess communicating with an open wound on the skin. This contains fluid and gas. It is located distally just above the knee joint medially. This measures a maximum of 10.3 x 6.6 x 2.4 cm. This is superficial to the sartorius muscle. I do not see any definite muscle involvement. The muscular compartment structures appear normal. No findings for myofasciitis or pyomyositis. There is a nearby surgical clip likely due to vein harvesting from bypass surgery 2 years ago. The hip and knee joints are maintained. I do not see any findings suspicious for septic arthritis. The femur is intact. No evidence of osteomyelitis. The visualized bony pelvis is intact. SI joint degenerative changes are noted and mild hip joint degenerative changes. The major vascular structures are patent. Scattered atherosclerotic calcifications are noted. No evidence of deep venous thrombosis. No significant intrapelvic abnormalities are identified. No inguinal mass or adenopathy. IMPRESSION: 1. 10 x 6.5 x 2.5 cm thick walled abscess involving the medial distal thigh and communicating with an open wound on the skin. 2. Diffuse cellulitis but no findings for myofasciitis, pyomyositis, septic arthritis or osteomyelitis. Electronically Signed   By: Rudie Meyer M.D.   On: 03/14/2019 09:24     Azucena Fallen DO [facility]   How to contact the [facility] Attending or Consulting provider 7A - 7P or covering provider during after hours 7P -7A, for this patient?  1. Check the care team in [facility] and look for a) attending/consulting [facility] provider listed and b) the [facility] team listed 2. Log into [email or web address] and use 's universal password to access. If you do not have the password, please contact the hospital operator. 3. Locate the [facility] provider you are looking for under [facility] and page to a number that you can be directly reached. 4. If you still have difficulty reaching the provider, please page the [facility] (Director on Call) for the Hospitalists listed on amion for assistance.  03/15/2019, 8:24 AM

## 2019-03-15 NOTE — ED Notes (Signed)
Patient changed to a hospital bed for comfort.

## 2019-03-15 NOTE — Progress Notes (Addendum)
ANTICOAGULATION CONSULT NOTE - Follow Up Consult  Pharmacy Consult for heparin Indication: h/o PE with antiphospholipid syndrome  Labs: Recent Labs    03/13/19 1710 03/14/19 1636 03/15/19 0100  HGB 13.0  --   --   HCT 39.6  --   --   PLT 353  --   --   LABPROT 22.6* 18.3*  --   INR 2.0* 1.5*  --   HEPARINUNFRC  --   --  0.22*  CREATININE 0.86  --   --     Assessment: 63yo male subtherapeutic on heparin with initial dosing while Coumadin on hold; no gtt issues or signs of bleeding per RN.  Goal of Therapy:  Heparin level 0.3-0.7 units/ml   Plan:  Will increase heparin gtt by 1-2 units/kg/hr to 1900 units/hr and check level in 6 hours.    Wynona Neat, PharmD, BCPS  03/15/2019,3:09 AM  Addendum: Heparin infusion was stopped at 0750 this AM in anticipation of debridement of left thigh this PM by Dr. Sharol Given. Will f/u anticoagulation plan post-op.  Berenice Bouton, PharmD PGY1 Pharmacy Resident  Please check AMION for all [place] phone numbers After 10:00 PM, call [place] [phone] 03/15/2019 10:34 AM

## 2019-03-15 NOTE — Plan of Care (Signed)
  Problem: Clinical Measurements: Goal: Ability to maintain clinical measurements within normal limits will improve Outcome: Progressing Goal: Postoperative complications will be avoided or minimized Outcome: Progressing   

## 2019-03-15 NOTE — ED Notes (Signed)
Dr Duda at bedside. 

## 2019-03-15 NOTE — Transfer of Care (Signed)
Immediate Anesthesia Transfer of Care Note  Patient: Prestin Munch  Procedure(s) Performed: DEBRIDEMENT LEFT THIGH (Left )  Patient Location: PACU  Anesthesia Type:General  Level of Consciousness: awake, alert  and oriented  Airway & Oxygen Therapy: Patient Spontanous Breathing and Patient connected to face mask oxygen  Post-op Assessment: Report given to RN and Post -op Vital signs reviewed and stable  Post vital signs: Reviewed and stable  Last Vitals:  Vitals Value Taken Time  BP 154/81 03/15/19 1709  Temp 36.9 C 03/15/19 1705  Pulse 85 03/15/19 1710  Resp 10 03/15/19 1710  SpO2 100 % 03/15/19 1710  Vitals shown include unvalidated device data.  Last Pain:  Vitals:   03/15/19 1705  TempSrc:   PainSc: (P) 10-Worst pain ever      Patients Stated Pain Goal: 0 (11/57/26 2035)  Complications: No apparent anesthesia complications

## 2019-03-15 NOTE — Progress Notes (Signed)
Offered to set patient up on CPAP he states don't want to deal with a CPAP at this time if he changes his mind he will advise.

## 2019-03-15 NOTE — Anesthesia Postprocedure Evaluation (Signed)
Anesthesia Post Note  Patient: Jose [name]  Procedure(s) Performed: DEBRIDEMENT LEFT THIGH (Left )     Patient location during evaluation: PACU Anesthesia Type: General Level of consciousness: sedated and patient cooperative Pain management: pain level controlled Vital Signs Assessment: post-procedure vital signs reviewed and stable Respiratory status: spontaneous breathing Cardiovascular status: stable Anesthetic complications: no    Last Vitals:  Vitals:   03/15/19 1817 03/15/19 2120  BP: 133/60 (!) 117/55  Pulse: 85 82  Resp: 17 16  Temp: 36.8 C 36.8 C  SpO2: 99% 97%    Last Pain:  Vitals:   03/15/19 2250  TempSrc:   PainSc: [name]

## 2019-03-15 NOTE — ED Notes (Signed)
Attempted report 

## 2019-03-15 NOTE — Op Note (Signed)
03/15/2019  6:37 PM  PATIENT:  Chanetta Marshall    PRE-OPERATIVE DIAGNOSIS: Chronic ABSCESS LEFT THIGH, distal medial   POST-OPERATIVE DIAGNOSIS:  Same  PROCEDURE:  DEBRIDEMENT LEFT THIGH  Excision skin soft tissue muscle and fascia from the distal medial aspect left thigh  Tissue sent for cultures. Application of cleanse choice wound VAC. Local tissue rearrangement for wound closure 20 x 10 cm.  SURGEON:  Newt Minion, MD  PHYSICIAN ASSISTANT:None ANESTHESIA:   General  PREOPERATIVE INDICATIONS:  Cadell Gabrielson is a  63 y.o. male with a diagnosis of [place] who failed conservative measures and elected for surgical management.    The risks benefits and alternatives were discussed with the patient preoperatively including but not limited to the risks of infection, bleeding, nerve injury, cardiopulmonary complications, the need for revision surgery, among others, and the patient was willing to proceed.  OPERATIVE IMPLANTS: Cleanse choice wound VAC sponge x2  @ENCIMAGES @  OPERATIVE FINDINGS: Large deep abscess with chronic tenderness degenerative tissue.  OPERATIVE PROCEDURE: Patient was brought the operating room underwent a general anesthetic.  After adequate levels anesthesia were obtained patient's left lower extremity was prepped using DuraPrep draped in the sterile field a timeout was called.  An elliptical incision was made around the ulcerative tissue the abscess and ulcerative tissue were resected and blocks of tissue.  The final wound was 20 x 10 cm and approximately 7 cm deep.  Fibrinous necrotic tissue was resected back to healthy viable margins.  All tissue was sent for cultures.  The wound was irrigated with normal saline electrocautery was used for hemostasis.  2 cleanse choice sponges were placed deep within the wound and local tissue rearrangement was used to close the wound 20 x 10 cm.  The leg was overwrapped with a Coban dressing this had a good suction fit  the tissue was sent for cultures.   DISCHARGE PLANNING:  Antibiotic duration: Continue IV antibiotics  Weightbearing: Weightbearing as tolerated on the left  Pain medication: Opioid pathway  Dressing care/ Wound VAC: Continue wound VAC through next week with planned repeat irrigation and debridement.  Ambulatory devices: Walker or crutches  Discharge to: Plan for repeat irrigation and debridement next week continue IV antibiotics and adjust pending culture sensitivities.  Continue the wound VAC.  Follow-up: In the office 1 week post operative.

## 2019-03-15 NOTE — Anesthesia Procedure Notes (Signed)
Procedure Name: LMA Insertion Date/Time: 03/15/2019 4:15 PM Performed by: Griffin Dakin, CRNA Pre-anesthesia Checklist: Patient identified, Emergency Drugs available, Suction available and Patient being monitored Patient Re-evaluated:Patient Re-evaluated prior to induction Oxygen Delivery Method: Circle system utilized Preoxygenation: Pre-oxygenation with 100% oxygen Induction Type: IV induction Ventilation: Two handed mask ventilation required and Oral airway inserted - appropriate to patient size LMA: LMA inserted LMA Size: 5.0 Tube type: Oral Number of attempts: 1 Airway Equipment and Method: Oral airway Placement Confirmation: positive ETCO2 and breath sounds checked- equal and bilateral Tube secured with: Tape Dental Injury: Teeth and Oropharynx as per pre-operative assessment

## 2019-03-15 NOTE — Progress Notes (Signed)
Received pt from ED, AOx4, VSS, oriented to room, call bell, use of bed controls, informed about visitation policy. No complaints of pain at this time, will continue to monitor

## 2019-03-15 NOTE — Consult Note (Signed)
ORTHOPAEDIC CONSULTATION  REQUESTING PHYSICIAN: Azucena FallenLancaster, William C, MD  Chief Complaint: Chronic abscess left thigh  HPI: Jose [name] is a 63 y.o. male who presents with chronic abscess medial aspect left thigh just superior to the knee.  Patient is status post CABG procedure 2 years ago.  Patient states he has had a chronic abscess from the greater saphenous vein harvest site for the CABG.  Patient states he is undergone multiple surgical interventions with debridement without resolution.  Patient is most recently been [facility] patient was referred to the hospital for evaluation and treatment.  Patient was evaluated by general surgery and was felt to be more appropriate for orthopedic care.  Past Medical History:  Diagnosis Date  . Antiphospholipid antibody syndrome (HCC)   . CAD S/P percutaneous coronary angioplasty '98, '04, Feb 2017   [facility]  . Carotid arterial disease (HCC) 10/2015   moderate bilateral  . COPD (chronic obstructive pulmonary disease) (HCC)   . Coronary artery disease   . Hypertension   . Non-insulin treated type 2 diabetes mellitus (HCC)   . Pulmonary emboli (HCC) 2011  . RBBB   . Sleep apnea    Past Surgical History:  Procedure Laterality Date  . APPENDECTOMY    . BACK SURGERY  '89, '06  . COLONOSCOPY    . CORONARY ANGIOPLASTY WITH STENT PLACEMENT  '98, '04, Feb 2017   Social History   Socioeconomic History  . Marital status: Married    Spouse name: Not on file  . Number of children: Not on file  . Years of education: Not on file  . Highest education level: Not on file  Occupational History  . Not on file  Tobacco Use  . Smoking status: Former Smoker    Packs/day: 0.50    Types: Cigarettes  . Smokeless tobacco: Never Used  . Tobacco comment: has started smoking again, willing to quit.  Substance and Sexual Activity  . Alcohol use: No  . Drug use: No  . Sexual activity: Yes  Other Topics Concern  .  Not on file  Social History Narrative  . Not on file   Social Determinants of Health   Financial Resource Strain:   . Difficulty of Paying Living Expenses: Not on file  Food Insecurity:   . Worried About Programme researcher, broadcasting/film/videounning Out of Food in the Last Year: Not on file  . Ran Out of Food in the Last Year: Not on file  Transportation Needs:   . Lack of Transportation (Medical): Not on file  . Lack of Transportation (Non-Medical): Not on file  Physical Activity:   . Days of Exercise per Week: Not on file  . Minutes of Exercise per Session: Not on file  Stress:   . Feeling of Stress : Not on file  Social Connections:   . Frequency of Communication with Friends and Family: Not on file  . Frequency of Social Gatherings with Friends and Family: Not on file  . Attends Religious Services: Not on file  . Active Member of Clubs or Organizations: Not on file  . Attends BankerClub or Organization Meetings: Not on file  . Marital Status: Not on file   Family History  Problem Relation Age of Onset  . CAD Father 7072   - negative except otherwise stated in the family history section Allergies  Allergen Reactions  . Melatonin Anxiety  . Ramipril     cough   Prior to Admission medications  Medication Sig Start Date End Date Taking? Authorizing Provider  albuterol (PROVENTIL) (5 MG/ML) 0.5% nebulizer solution Take 0.5 mLs (2.5 mg total) by nebulization every 6 (six) hours as needed for wheezing or shortness of breath. 12/27/18  Yes Terald Sleeper, MD  aspirin EC 81 MG tablet Take 81 mg by mouth daily.   Yes [provider]  Blood Glucose Monitoring Suppl (GLUCOCOM BLOOD GLUCOSE MONITOR) DEVI 1 each by XX route as directed (For Blood Glucose Monitoring) 08/23/17  Yes [provider]  Fluticasone Propionate, Inhal, 100 MCG/BLIST AEPB Inhale 1 puff into the lungs 2 (two) times daily.   Yes [provider]  furosemide (LASIX) 40 MG tablet Take 1 tablet (40 mg total) by mouth daily.  02/12/17  Yes Emokpae, Courage, MD  gabapentin (NEURONTIN) 300 MG capsule Take 300 mg by mouth 3 (three) times daily.   Yes [provider]  HYDROcodone-acetaminophen (NORCO) 10-325 MG tablet Take 1 tablet by mouth 2 (two) times daily.   Yes [provider]  isosorbide mononitrate (IMDUR) 60 MG 24 hr tablet Take 1 tablet (60 mg total) by mouth daily. 02/13/17  Yes Emokpae, Courage, MD  metoprolol tartrate (LOPRESSOR) 12.5 mg TABS tablet Take 12.5 mg by mouth 2 (two) times daily.  08/22/17 03/14/19 Yes [provider]  nitroGLYCERIN (NITROSTAT) 0.4 MG SL tablet Place 0.4 mg under the tongue every 5 (five) minutes as needed for chest pain.  07/07/16  Yes [provider]  pantoprazole (PROTONIX) 40 MG tablet Take 40 mg by mouth 2 (two) times daily. 06/03/16  Yes [provider]  PROAIR HFA 108 (90 Base) MCG/ACT inhaler Inhale 1-2 puffs into the lungs every 4 (four) hours as needed for wheezing.  07/07/16  Yes [provider]  rosuvastatin (CRESTOR) 10 MG tablet Take 10 mg by mouth every evening. 07/07/16  Yes [provider]  tamsulosin (FLOMAX) 0.4 MG CAPS capsule Take 0.4 mg by mouth daily. 06/05/16  Yes [provider]  Tiotropium Bromide-Olodaterol (STIOLTO RESPIMAT) 2.5-2.5 MCG/ACT AERS Inhale 2 puffs into the lungs daily.  09/10/17  Yes [provider]  tiZANidine (ZANAFLEX) 4 MG tablet Take 4 mg by mouth 3 (three) times daily.   Yes [provider]  topiramate (TOPAMAX) 50 MG tablet Take 50 mg by mouth 2 (two) times daily. 06/05/16  Yes [provider]  warfarin (COUMADIN) 5 MG tablet Take 5 mg by mouth daily.  07/17/16  Yes [provider]   CT FEMUR LEFT W CONTRAST  Result Date: 03/14/2019 CLINICAL DATA:  Thigh pain and swelling. EXAM: CT OF THE LOWER RIGHT EXTREMITY WITH CONTRAST TECHNIQUE: Multidetector CT imaging of the lower right extremity was performed according to the standard protocol  following intravenous contrast administration. COMPARISON:  None. CONTRAST:  OMNIPAQUE IOHEXOL 300 MG/ML  SOLN FINDINGS: Skin thickening and subcutaneous soft tissue swelling/edema throughout the entire left thigh consistent with cellulitis. More distally there is a discrete thick-walled abscess communicating with an open wound on the skin. This contains fluid and gas. It is located distally just above the knee joint medially. This measures a maximum of 10.3 x 6.6 x 2.4 cm. This is superficial to the sartorius muscle. I do not see any definite muscle involvement. The muscular compartment structures appear normal. No findings for myofasciitis or pyomyositis. There is a nearby surgical clip likely due to vein harvesting from bypass surgery 2 years ago. The hip and knee joints are maintained. I do not see any findings suspicious  for septic arthritis. The femur is intact. No evidence of osteomyelitis. The visualized bony pelvis is intact. SI joint degenerative changes are noted and mild hip joint degenerative changes. The major vascular structures are patent. Scattered atherosclerotic calcifications are noted. No evidence of deep venous thrombosis. No significant intrapelvic abnormalities are identified. No inguinal mass or adenopathy. IMPRESSION: 1. 10 x 6.5 x 2.5 cm thick walled abscess involving the medial distal thigh and communicating with an open wound on the skin. 2. Diffuse cellulitis but no findings for myofasciitis, pyomyositis, septic arthritis or osteomyelitis. Electronically Signed   By: Marijo Sanes M.D.   On: 03/14/2019 09:24   - pertinent xrays, CT, MRI studies were reviewed and independently interpreted  Positive ROS: All other systems have been reviewed and were otherwise negative with the exception of those mentioned in the HPI and as above.  Physical Exam: General: Alert, no acute distress Psychiatric: Patient is competent for consent with normal mood and affect Lymphatic: No axillary  or cervical lymphadenopathy Cardiovascular: No pedal edema Respiratory: No cyanosis, no use of accessory musculature GI: No organomegaly, abdomen is soft and non-tender    Images:  @ENCIMAGES @  Labs:  Lab Results  Component Value Date   HGBA1C 6.9 (H) 03/15/2019   HGBA1C 5.9 (H) 08/16/2016   REPTSTATUS PENDING 03/13/2019   GRAMSTAIN  03/08/2019    NO WBC SEEN RARE GRAM POSITIVE COCCI Performed at [facility], [place] [address], [place], [place] [address]    CULT  03/13/2019    NO GROWTH < 24 HOURS Performed at [facility] [address], [place], [place] [address]    LABORGA STAPHYLOCOCCUS LUGDUNENSIS 03/08/2019    Lab Results  Component Value Date   ALBUMIN 3.2 (L) 03/13/2019   ALBUMIN 3.4 (L) 12/27/2018    Neurologic: Patient does not have protective sensation bilateral lower extremities.   MUSCULOSKELETAL:   Skin: Examination patient has a purulent draining abscess distal medial aspect of the left thigh.  Patient has good hair growth in the lower extremity I cannot palpate a pulse but most likely due to his venous swelling and edema.  He has brawny skin color changes from venous insufficiency but no cellulitis no open venous ulcers.  The knee is uninvolved there is no pain with range of motion knee no effusion of the left knee.  Assessment: Assessment: Chronic abscess left distal thigh medially secondary to greater saphenous vein graft harvesting for CABG procedure with diabetic insensate neuropathy history of tobacco use.  Plan: Plan: Discussed patient we most likely will need to proceed with a 2-week staged procedure of initial excisional debridement placement of a installation wound VAC and IV antibiotics per tissue cultures.  Return to the operating room for repeat irrigation debridement and wound closure with application of a incisional wound VAC.  Most likely patient will need at least 4 weeks of oral antibiotics.  Due to the chronic nature of the  infection would recommend antibiotics as per infectious disease.  Recommend infectious disease consult after the tissue samples are obtained.  Plan for surgery as an add-on today patient is to be n.p.o. today.  Thank you for the consult and the opportunity to see Jose [name], [place] [phone] 9:18 AM

## 2019-03-15 NOTE — Anesthesia Preprocedure Evaluation (Addendum)
Anesthesia Evaluation  Patient identified by MRN, date of birth, ID band Patient awake    Reviewed: Allergy & Precautions, NPO status , Patient's Chart, lab work & pertinent test results, reviewed documented beta blocker date and time   History of Anesthesia Complications Negative for: history of anesthetic complications  Airway Mallampati: II  TM Distance: >3 FB Neck ROM: Full    Dental  (+) Dental Advisory Given   Pulmonary neg pulmonary ROS, sleep apnea , COPD, former smoker, PE (2011)   Pulmonary exam normal breath sounds clear to auscultation       Cardiovascular hypertension, Pt. on home beta blockers and Pt. on medications + angina + CAD  Normal cardiovascular exam+ dysrhythmias  Rhythm:Regular Rate:Normal     Neuro/Psych negative neurological ROS  negative psych ROS   GI/Hepatic negative GI ROS, Neg liver ROS, GERD  Medicated,  Endo/Other  negative endocrine ROSdiabetes, Type 2  Renal/GU negative Renal ROS     Musculoskeletal negative musculoskeletal ROS (+) Left thigh wound   Abdominal   Peds  Hematology negative hematology ROS (+) Antiphospholipid antibody syndrome on Coumadin   Anesthesia Other Findings Day of surgery medications reviewed with patient.  Reproductive/Obstetrics                            Anesthesia Physical Anesthesia Plan  ASA: III  Anesthesia Plan: General   Post-op Pain Management:    Induction: Intravenous  PONV Risk Score and Plan: 3 and Treatment may vary due to age or medical condition and Ondansetron  Airway Management Planned: LMA  Additional Equipment: None  Intra-op Plan:   Post-operative Plan: Extubation in OR  Informed Consent: I have reviewed the patients History and Physical, chart, labs and discussed the procedure including the risks, benefits and alternatives for the proposed anesthesia with the patient or authorized representative  who has indicated his/her understanding and acceptance.     Dental advisory given  Plan Discussed with: CRNA  Anesthesia Plan Comments:        Anesthesia Quick Evaluation

## 2019-03-15 NOTE — ED Notes (Signed)
Patient placed on hospital bed for comfort-Monique,RN  

## 2019-03-15 NOTE — TOC Initial Note (Addendum)
Transition of Care [facility]) - Initial/Assessment Note    Patient Details  Name: Jose [name] MRN: [id number] Date of Birth: Apr 29, 1955  [facility]) CM/SW Contact:    Marilu Favre, RN Phone Number: 03/15/2019, 11:51 AM  Clinical Narrative:                   Patient for surgery. Will continue to follow for discharge needs. Home VAC application placed on chart in case needed.    Received consult from pharmacy  : Pt's outpatient anticoag clinic [facility] in [place]) provider asks if home health can check INR during home visits and call the result into the clinic so pt doesn't have to travel for INR check. Clinic phone number is [phone]. Can you see if that would be possible? Thanks. Dr. Avon Gully is aware.  Spoke with patient , he does not feel this is necessary. Will continue to follow   Expected Discharge Plan: [place]     Patient Goals and CMS Choice        Expected Discharge Plan and Services Expected Discharge Plan: [place]   Discharge Planning Services: CM Consult   Living arrangements for the past 2 months: Single Family Home                                      Prior Living Arrangements/Services Living arrangements for the past 2 months: Single Family Home Lives with:: Spouse                   Activities of Daily Living      Permission Sought/Granted   Permission granted to share information with : No              Emotional Assessment       Orientation: : Oriented to  Time, Oriented to Situation, Oriented to Place, Oriented to Self      Admission diagnosis:  Abscess [L02.91] Patient Active Problem List   Diagnosis Date Noted  . Abscess of left thigh   . Abscess 03/14/2019  . Coronary atherosclerosis 10/04/2017  . GERD (gastroesophageal reflux disease) 10/04/2017  . Hypertension 10/04/2017  . Pulmonary embolism ([place]) 10/04/2017  . Transient visual  loss 10/04/2017  . 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 Nonrheumatic aortic valve stenosis 03/31/2017  . Pericarditis, Acute 02/12/2017  . Acute on chronic right heart failure ([place]) 02/11/2017  . Angina at rest [facility]) 08/17/2016  . Unstable angina ([place]) 08/16/2016  . Tobacco abuse 08/16/2016  . Anti-phospholipid syndrome ([place]) 08/16/2016  . Hyperlipidemia 08/16/2016  . Essential hypertension, benign 08/16/2016  . COPD (chronic obstructive pulmonary disease) ([place]) 08/16/2016  . Sleep apnea 08/16/2016  . CAD S/P multiple PCIs 08/16/2016  . History of pulmonary embolus (PE)-2011 08/16/2016  . Chronic anticoagulation-Couamdin 08/16/2016  . Non-insulin treated type 2 diabetes mellitus ([place]) 08/16/2016  . Carotid artery disease ([place]) 08/16/2016  . RBBB (right bundle branch block) 08/16/2016  . Dyspnea on exertion 10/17/2015  . ED (erectile dysfunction) 08/09/2012   PCP:  Elenore Paddy, MD Pharmacy:   [id number] - [place], [place] [address] Phone: [phone] Fax: [phone]     Social Determinants of Health (SDOH) Interventions    Readmission Risk Interventions No flowsheet data found.

## 2019-03-15 NOTE — ED Notes (Signed)
MS   Breakfast ordered  

## 2019-03-16 ENCOUNTER — Other Ambulatory Visit: Payer: Self-pay | Admitting: Physician Assistant

## 2019-03-16 ENCOUNTER — Inpatient Hospital Stay (HOSPITAL_COMMUNITY): Payer: BC Managed Care – PPO

## 2019-03-16 DIAGNOSIS — L0390 Cellulitis, unspecified: Secondary | ICD-10-CM

## 2019-03-16 DIAGNOSIS — J449 Chronic obstructive pulmonary disease, unspecified: Secondary | ICD-10-CM

## 2019-03-16 DIAGNOSIS — G4730 Sleep apnea, unspecified: Secondary | ICD-10-CM

## 2019-03-16 DIAGNOSIS — Z888 Allergy status to other drugs, medicaments and biological substances status: Secondary | ICD-10-CM

## 2019-03-16 DIAGNOSIS — I10 Essential (primary) hypertension: Secondary | ICD-10-CM

## 2019-03-16 DIAGNOSIS — Z7901 Long term (current) use of anticoagulants: Secondary | ICD-10-CM

## 2019-03-16 DIAGNOSIS — I350 Nonrheumatic aortic (valve) stenosis: Secondary | ICD-10-CM

## 2019-03-16 DIAGNOSIS — Z87891 Personal history of nicotine dependence: Secondary | ICD-10-CM

## 2019-03-16 DIAGNOSIS — B957 Other staphylococcus as the cause of diseases classified elsewhere: Secondary | ICD-10-CM

## 2019-03-16 DIAGNOSIS — Z951 Presence of aortocoronary bypass graft: Secondary | ICD-10-CM

## 2019-03-16 DIAGNOSIS — D6861 Antiphospholipid syndrome: Secondary | ICD-10-CM

## 2019-03-16 DIAGNOSIS — E119 Type 2 diabetes mellitus without complications: Secondary | ICD-10-CM

## 2019-03-16 LAB — COMPREHENSIVE METABOLIC PANEL
ALT: 22 U/L (ref 0–44)
AST: 23 U/L (ref 15–41)
Albumin: 2.9 g/dL — ABNORMAL LOW (ref 3.5–5.0)
Alkaline Phosphatase: 49 U/L (ref 38–126)
Anion gap: 8 (ref 5–15)
BUN: 10 mg/dL (ref 8–23)
CO2: 28 mmol/L (ref 22–32)
Calcium: 8.3 mg/dL — ABNORMAL LOW (ref 8.9–10.3)
Chloride: 102 mmol/L (ref 98–111)
Creatinine, Ser: 0.8 mg/dL (ref 0.61–1.24)
GFR calc Af Amer: >60 mL/min (ref >60)
GFR calc non Af Amer: >60 mL/min (ref >60)
Glucose, Bld: 113 mg/dL — ABNORMAL HIGH (ref 70–99)
Potassium: 3.8 mmol/L (ref 3.5–5.1)
Sodium: 138 mmol/L (ref 135–145)
Total Bilirubin: 0.6 mg/dL (ref 0.3–1.2)
Total Protein: 6.1 g/dL — ABNORMAL LOW (ref 6.5–8.1)

## 2019-03-16 LAB — CBC
HCT: 36.7 % — ABNORMAL LOW (ref 39.0–52.0)
Hemoglobin: 11.6 g/dL — ABNORMAL LOW (ref 13.0–17.0)
MCH: 30.5 pg (ref 26.0–34.0)
MCHC: 31.6 g/dL (ref 30.0–36.0)
MCV: 96.6 fL (ref 80.0–100.0)
Platelets: 323 10*3/uL (ref 150–400)
RBC: 3.8 MIL/uL — ABNORMAL LOW (ref 4.22–5.81)
RDW: 14.6 % (ref 11.5–15.5)
WBC: 11.4 10*3/uL — ABNORMAL HIGH (ref 4.0–10.5)
nRBC: 0 % (ref 0.0–0.2)

## 2019-03-16 LAB — ECHOCARDIOGRAM COMPLETE
Height: 68 in
Weight: 3837.77 oz

## 2019-03-16 LAB — PROTIME-INR
INR: 1.1 (ref 0.8–1.2)
Prothrombin Time: 14.5 seconds (ref 11.4–15.2)

## 2019-03-16 LAB — GLUCOSE, CAPILLARY
Glucose-Capillary: 137 mg/dL — ABNORMAL HIGH (ref 70–99)
Glucose-Capillary: 169 mg/dL — ABNORMAL HIGH (ref 70–99)
Glucose-Capillary: 138 mg/dL — ABNORMAL HIGH (ref 70–99)
Glucose-Capillary: 176 mg/dL — ABNORMAL HIGH (ref 70–99)

## 2019-03-16 LAB — HEPARIN LEVEL (UNFRACTIONATED): Heparin Unfractionated: 0.22 IU/mL — ABNORMAL LOW (ref 0.30–0.70)

## 2019-03-16 MED ORDER — HEPARIN (PORCINE) 25000 UT/250ML-% IV SOLN
2150.0000 [IU]/h | INTRAVENOUS | Status: AC
  Administered 2019-03-16: 13:00:00 1750 [IU]/h via INTRAVENOUS
  Administered 2019-03-17: 16:00:00 2150 [IU]/h via INTRAVENOUS
  Administered 2019-03-17: 02:00:00 1950 [IU]/h via INTRAVENOUS
  Administered 2019-03-18 – 2019-03-21 (×6): 2150 [IU]/h via INTRAVENOUS
  Filled 2019-03-16 (×11): qty 250

## 2019-03-16 NOTE — Progress Notes (Signed)
Pt. Refused cpap for tonight. 

## 2019-03-16 NOTE — Progress Notes (Signed)
Progress Note   Jose [name] HER:[id number] DOB: Jun 18, 1955 DOA: 03/13/2019  I have briefly reviewed the patient's prior medical records in [place]  PCP: Elenore Paddy, MD  Patient coming from: home  Chief Complaint: wound with discharge  HPI: Jose [name] is a 63 y.o. male with medical history significant of antiphospholipid antibody syndrome on Coumadin with an INR goal of 2.5-3.5, COPD, type 2 diabetes not on insulin, sleep apnea, hypertension and a chronic leg wound.  Per patient he has had this leg wound for the last 2 years but over the last week he has had increasing pain, redness, and copious discharge.  He has seen Dr. Dellia Nims at a wound care center and had his wound culture which grew staph Lugdunensis.  His wound care physician called him in a course of doxycycline but patient has not yet started because he is concerned that he needs IV antibiotics and his inability to manage the drainage.  He denies fever.  In the ER a CT scan of his thigh was obtained that shows a thick walled abscess in the medial distal thigh communicating with an open wound on the skin with surrounding cellulitis.  Patient was initially seen by the general surgery team in the ER who recommended an orthopedic surgery consult given location and proximity to the knee joint.  Patient was seen by orthopedics in the ER and initial recommendations are for ABIs.  If his ABIs are within normal limits the plan is to operate for a washout and wound VAC placement.  Patient's situation is complicated by his need for Coumadin and the need for a heparin bridge.  Subjective: No acute issues or events overnight night, patient tolerated procedure yesterday quite well, ongoing pain but moderately well controlled on current regimen.  Patient somewhat hesitant to initiate physical therapy or move given pain, otherwise denies headache, fever, chills, nausea, vomiting, diarrhea, constipation.  Assessment/Plan Active Problems:   Anti-phospholipid syndrome (HCC)   Essential hypertension, benign   COPD (chronic obstructive pulmonary disease) (HCC)   Sleep apnea   CAD S/P multiple PCIs   Chronic anticoagulation-Couamdin   Non-insulin treated type 2 diabetes mellitus (HCC)   Abscess   Acute on chronic complex leg wound with abscess, POA -Left medial thigh, does not meet sepsis criteria -CT scan shows a thickened walled abscess with a communicating opening to the skin and cellulitis. -Orthopedics following, appreciate insight and recommendations -ABI WNL - no need for further vascular workup -Orthopedic surgery following, appreciate insight recommendations, tolerated debridement on 03/15/19 with wound VAC placement without incident.  Next Wednesday on 03/22/19 -Currently on vancomycin, will de-escalate pending cultures.   Morbid obesity Estimated body mass index is 36.49 kg/m as calculated from the following:   Height as of 12/27/18: 5\' 8"  (1.727 m).   Weight as of this encounter: 108.9 kg.   Type II diabetes: not on insulin -Hemoglobin A1c 6.9 -Sliding scale insulin  Antiphospholipid syndrome -On Coumadin with a goal of 2.5-3.5 -Resume heparin gtt per pharmacy - defer to ortho for peri-operative anticoagulation. Will place back on coumadin once completed surgical intervention. Lab Results  Component Value Date   INR 1.5 (H) 03/14/2019   INR 2.0 (H) 03/13/2019   INR 2.0 (H) 12/27/2018   Chronic respiratory failure due to COPD, without acute exacerbation -Wears 2L at home  Hypertension -Resume home medications as tolerated -Currently holding home medications given current blood pressure  DVT prophylaxis: heparin gtt while INR <2.5 and perioperative - defer to orthopedics  for re-initiation of coumadin (once deemed safe after surgery) Code Status: full  Disposition Plan:  Consults called: orthopedics and general surgery    Past Medical History:  Diagnosis Date  . Antiphospholipid antibody syndrome  (HCC)   . CAD S/P percutaneous coronary angioplasty '98, '04, Feb 2017   [facility]  . Carotid arterial disease (HCC) 10/2015   moderate bilateral  . COPD (chronic obstructive pulmonary disease) (HCC)   . Coronary artery disease   . Hypertension   . Non-insulin treated type 2 diabetes mellitus (HCC)   . Pulmonary emboli (HCC) 2011  . RBBB   . Sleep apnea     Past Surgical History:  Procedure Laterality Date  . APPENDECTOMY    . BACK SURGERY  '89, '06  . COLONOSCOPY    . CORONARY ANGIOPLASTY WITH STENT PLACEMENT  '98, '04, Feb 2017  . I & D EXTREMITY Left 03/15/2019   Procedure: DEBRIDEMENT LEFT THIGH;  Surgeon: Nadara Mustard, MD;  Location: [facility] OR;  Service: Orthopedics;  Laterality: Left;     reports that he has quit smoking. His smoking use included cigarettes. He smoked 0.50 packs per day. He has never used smokeless tobacco. He reports that he does not drink alcohol or use drugs.  Allergies  Allergen Reactions  . Melatonin Anxiety  . Ramipril     cough    Family History  Problem Relation Age of Onset  . CAD Father 69    Prior to Admission medications   Medication Sig Start Date End Date Taking? Authorizing Provider  albuterol (PROVENTIL) (5 MG/ML) 0.5% nebulizer solution Take 0.5 mLs (2.5 mg total) by nebulization every 6 (six) hours as needed for wheezing or shortness of breath. 12/27/18   Terald Sleeper, MD  ALPRAZolam Prudy Feeler) 0.5 MG tablet TAKE ONE TABLET (0.5 MG TOTAL) BY MOUTH 3 (THREE) TIMES A DAY AS NEEDED FOR SLEEP OR ANXIETY. 09/13/17   [provider]  aspirin EC 81 MG tablet Take 81 mg by mouth daily.    [provider]  Blood Glucose Monitoring Suppl (GLUCOCOM BLOOD GLUCOSE MONITOR) DEVI 1 each by XX route as directed (For Blood Glucose Monitoring) 08/23/17   [provider]  colchicine 0.6 MG tablet Take 1 tablet (0.6 mg total) by mouth 2 (two) times daily. Patient not taking: Reported on 10/04/2017 02/12/17   Shon Hale, MD    furosemide (LASIX) 40 MG tablet Take 1 tablet (40 mg total) by mouth daily. Patient not taking: Reported on 10/04/2017 02/12/17   Shon Hale, MD  isosorbide mononitrate (IMDUR) 60 MG 24 hr tablet Take 1 tablet (60 mg total) by mouth daily. Patient not taking: Reported on 10/04/2017 02/13/17   Shon Hale, MD  methylPREDNISolone (MEDROL DOSEPAK) 4 MG TBPK tablet Use as directed on package 12/27/18   Terald Sleeper, MD  metoprolol succinate (TOPROL-XL) 100 MG 24 hr tablet Take 100 mg by mouth daily. 08/04/16   [provider]  metoprolol tartrate (LOPRESSOR) 12.5 mg TABS tablet Take by mouth. 08/22/17 08/22/18  [provider]  nitroGLYCERIN (NITROSTAT) 0.4 MG SL tablet Place 0.4 mg under the tongue every 5 (five) minutes as needed for chest pain.  07/07/16   [provider]  pantoprazole (PROTONIX) 40 MG tablet Take 40 mg by mouth 2 (two) times daily. 06/03/16   [provider]  polyethylene glycol (MIRALAX / GLYCOLAX) packet Take 17 g by mouth daily. Patient not taking: Reported on 10/04/2017 02/12/17   Shon Hale, MD  potassium  chloride (MICRO-K) 10 MEQ CR capsule Take by mouth. 08/22/17 08/22/18  [provider]  PROAIR HFA 108 (90 Base) MCG/ACT inhaler Inhale 1-2 puffs into the lungs every 4 (four) hours as needed for wheezing.  07/07/16   [provider]  rosuvastatin (CRESTOR) 10 MG tablet Take 10 mg by mouth every evening. 07/07/16   [provider]  tamsulosin (FLOMAX) 0.4 MG CAPS capsule Take 0.4 mg by mouth daily. 06/05/16   [provider]  Tiotropium Bromide-Olodaterol (STIOLTO RESPIMAT) 2.5-2.5 MCG/ACT AERS Inhale into the lungs. 09/10/17   [provider]  topiramate (TOPAMAX) 50 MG tablet Take 50 mg by mouth 2 (two) times daily. 06/05/16   [provider]  torsemide (DEMADEX) 20 MG tablet Take by mouth. 07/05/17 07/05/18  [provider]  warfarin (COUMADIN) 5 MG tablet Take 5-7.5 mg by  mouth See admin instructions. Take 7.5 mg on Monday, Wednesday and Friday then take 5 mg all the other days 07/17/16   [provider]    Physical Exam: Vitals:   03/15/19 1817 03/15/19 2120 03/16/19 0117 03/16/19 0546  BP: 133/60 (!) 117/55 128/70 113/68  Pulse: 85 82 92 91  Resp: 17 16 16    Temp: 98.2 F (36.8 C) 98.3 F (36.8 C) 98.2 F (36.8 C) 98.8 F (37.1 C)  TempSrc: Oral Oral Oral Oral  SpO2: 99% 97% 93% 100%  Weight:      Height:          Constitutional: NAD, calm, comfortable, obese male ENMT: Mucous membranes are moist.  Neck: normal, supple, no masses Respiratory:  Normal respiratory effort. No accessory muscle use.  Cardiovascular: Regular rate and rhythm, + LE edema Abdomen: no tenderness, no masses palpated. Bowel sounds positive.  Musculoskeletal: no clubbing / cyanosis. Normal muscle tone.  Skin: Left leg postoperative bandage and wound VAC intact without leak, draining dark sanguinous fluid. Neurologic: moves all 4 ext without deficit, left lower extremity movement limited by pain  Labs on Admission: I have personally reviewed following labs and imaging studies  CBC: Recent Labs  Lab 03/13/19 1710 03/15/19 0451 03/16/19 0316  WBC 10.1 9.6 11.4*  NEUTROABS 6.1  --   --   HGB 13.0 13.0 11.6*  HCT 39.6 39.7 36.7*  MCV 94.5 95.4 96.6  PLT 353 330 323   Basic Metabolic Panel: Recent Labs  Lab 03/13/19 1710 03/15/19 0451 03/16/19 0316  NA 140 140 138  K 3.8 3.4* 3.8  CL 104 101 102  CO2 24 28 28   GLUCOSE 113* 122* 113*  BUN 15 9 10   CREATININE 0.86 0.75 0.80  CALCIUM 9.2 8.7* 8.3*   GFR: Estimated Creatinine Clearance: 113.1 mL/min (by C-G formula based on SCr of 0.8 mg/dL). Liver Function Tests: Recent Labs  Lab 03/13/19 1710 03/16/19 0316  AST 26 23  ALT 24 22  ALKPHOS 52 49  BILITOT 0.2* 0.6  PROT 6.7 6.1*  ALBUMIN 3.2* 2.9*   No results for input(s): LIPASE, AMYLASE in the last 168 hours. No results for input(s):  AMMONIA in the last 168 hours. Coagulation Profile: Recent Labs  Lab 03/13/19 1710 03/14/19 1636  INR 2.0* 1.5*   Cardiac Enzymes: No results for input(s): CKTOTAL, CKMB, CKMBINDEX, TROPONINI in the last 168 hours. BNP (last 3 results) No results for input(s): PROBNP in the last 8760 hours. HbA1C: Recent Labs    03/15/19 0451  HGBA1C 6.9*   CBG: Recent Labs  Lab 03/15/19 1205 03/15/19 1411 03/15/19 1705 03/15/19 2119 03/16/19  0736  GLUCAP 106* 97 113* 178* 137*   Lipid Profile: No results for input(s): CHOL, HDL, LDLCALC, TRIG, CHOLHDL, LDLDIRECT in the last 72 hours. Thyroid Function Tests: No results for input(s): TSH, T4TOTAL, FREET4, T3FREE, THYROIDAB in the last 72 hours. Anemia Panel: No results for input(s): VITAMINB12, FOLATE, FERRITIN, TIBC, IRON, RETICCTPCT in the last 72 hours. Urine analysis: No results found for: COLORURINE, APPEARANCEUR, LABSPEC, PHURINE, GLUCOSEU, HGBUR, BILIRUBINUR, KETONESUR, PROTEINUR, UROBILINOGEN, NITRITE, LEUKOCYTESUR   Radiological Exams on Admission: CT FEMUR LEFT W CONTRAST  Result Date: 03/14/2019 CLINICAL DATA:  Thigh pain and swelling. EXAM: CT OF THE LOWER RIGHT EXTREMITY WITH CONTRAST TECHNIQUE: Multidetector CT imaging of the lower right extremity was performed according to the standard protocol following intravenous contrast administration. COMPARISON:  None. CONTRAST:  OMNIPAQUE IOHEXOL 300 MG/ML  SOLN FINDINGS: Skin thickening and subcutaneous soft tissue swelling/edema throughout the entire left thigh consistent with cellulitis. More distally there is a discrete thick-walled abscess communicating with an open wound on the skin. This contains fluid and gas. It is located distally just above the knee joint medially. This measures a maximum of 10.3 x 6.6 x 2.4 cm. This is superficial to the sartorius muscle. I do not see any definite muscle involvement. The muscular compartment structures appear normal. No findings for  myofasciitis or pyomyositis. There is a nearby surgical clip likely due to vein harvesting from bypass surgery 2 years ago. The hip and knee joints are maintained. I do not see any findings suspicious for septic arthritis. The femur is intact. No evidence of osteomyelitis. The visualized bony pelvis is intact. SI joint degenerative changes are noted and mild hip joint degenerative changes. The major vascular structures are patent. Scattered atherosclerotic calcifications are noted. No evidence of deep venous thrombosis. No significant intrapelvic abnormalities are identified. No inguinal mass or adenopathy. IMPRESSION: 1. 10 x 6.5 x 2.5 cm thick walled abscess involving the medial distal thigh and communicating with an open wound on the skin. 2. Diffuse cellulitis but no findings for myofasciitis, pyomyositis, septic arthritis or osteomyelitis. Electronically Signed   By: Rudie Meyer M.D.   On: 03/14/2019 09:24     Azucena Fallen DO [facility]   How to contact the [facility] Attending or Consulting provider 7A - 7P or covering provider during after hours 7P -7A, for this patient?  1. Check the care team in [facility] and look for a) attending/consulting TR[facility] provider listed and b) the [facility] team listed 2. Log into [email or web address] and use [place]'s universal password to access. If you do not have the password, please contact the hospital operator. 3. Locate the [facility] provider you are looking for under [facility] and page to a number that you can be directly reached. 4. If you still have difficulty reaching the provider, please page the [facility] (Director on Call) for the Hospitalists listed on amion for assistance.  03/16/2019, 8:33 AM

## 2019-03-16 NOTE — Progress Notes (Signed)
ANTICOAGULATION CONSULT NOTE - Follow Up Consult  Pharmacy Consult for Heparin Indication: Antiphospholipid Syndrome  Allergies  Allergen Reactions  . Melatonin Anxiety  . Ramipril     cough    Patient Measurements: Height: 5\' 8"  (172.7 cm) Weight: 239 lb 13.8 oz (108.8 kg) IBW/kg (Calculated) : 68.4 Heparin Dosing Weight: 92.5  Vital Signs: Temp: 98.4 F (36.9 C) (12/31 1535) Temp Source: Oral (12/31 1535) BP: 116/60 (12/31 1535) Pulse Rate: 90 (12/31 1535)  Labs: Recent Labs    03/14/19 1636 03/15/19 0100 03/15/19 0451 03/15/19 0937 03/16/19 0316 03/16/19 1856  HGB  --   --  13.0  --  11.6*  --   HCT  --   --  39.7  --  36.7*  --   PLT  --   --  330  --  323  --   LABPROT 18.3*  --   --   --   --  14.5  INR 1.5*  --   --   --   --  1.1  HEPARINUNFRC  --  0.22*  --  <0.10*  --  0.22*  CREATININE  --   --  0.75  --  0.80  --     Estimated Creatinine Clearance: 113.1 mL/min (by C-G formula based on SCr of 0.8 mg/dL).   Assessment: 76 YOM on warfarin PTA for history of antiphospholipid antibody syndrome on Coumadin with an INR goal of 2.5-3.5 - held on admission for need for I&D of L-thigh abscess. Heparin was started for bridging while warfarin on hold and INR<2.   The patient is s/p I&D on 12/30 with Heparin resumed post-op. Heparin level this evening is SUBtherapeutic.   Goal of Therapy:  Heparin level 0.3-0.7 units/ml Monitor platelets by anticoagulation protocol: Yes   Plan:  - Increase Heparin to 1950 units/hr (19.5 ml/hr) - Will continue to monitor for any signs/symptoms of bleeding and will follow up with heparin level in 6 hours   Thank you for allowing pharmacy to be a part of this patient's care.  Alycia Rossetti, PharmD, BCPS Clinical Pharmacist Clinical phone for 03/16/2019: [phone] 03/16/2019 8:10 PM   **Pharmacist phone directory can now be found on [email or web address] ([email or web address]).  Listed under [place].

## 2019-03-16 NOTE — Progress Notes (Signed)
S/P I and D Left thigh abscess. Complaining of more pain than previous procedures. VAC in place. CX taken. 250cc in [place] will consult infectious disease. Plan for repeat I and D next Wednesday

## 2019-03-16 NOTE — Progress Notes (Signed)
With 10 am med pass, patient reports he usually takes a different dose of torsemide. 40 mg ordered. Patient says "that's too much". Per patient, at home he decides the dose of torsemide based on pedal edema/swelling. Declines taking 40mg  today. Agrees to take only 20mg . Patient also asks about starting other home meds - flomax and neurontin. MD made aware of patient's concerns around meds - torsemide, flomax, and neurontin.

## 2019-03-16 NOTE — Progress Notes (Signed)
ANTICOAGULATION CONSULT NOTE - Pharmacy Consult for Heparin Indication: Antiphospholipid Syndrome  Allergies  Allergen Reactions  . Melatonin Anxiety  . Ramipril     cough    Patient Measurements: Height: 5\' 8"  (172.7 cm) Weight: 239 lb 13.8 oz (108.8 kg) IBW/kg (Calculated) : 68.4  Heparin dosing weight: 92.5 kg   Vital Signs: Temp: 98.6 F (37 C) (12/31 1000) Temp Source: Oral (12/31 1000) BP: 105/67 (12/31 1000) Pulse Rate: 98 (12/31 1000)  Labs: Recent Labs    03/13/19 1710 03/14/19 1636 03/15/19 0100 03/15/19 0451 03/15/19 0937 03/16/19 0316  HGB 13.0  --   --  13.0  --  11.6*  HCT 39.6  --   --  39.7  --  36.7*  PLT 353  --   --  330  --  323  LABPROT 22.6* 18.3*  --   --   --   --   INR 2.0* 1.5*  --   --   --   --   HEPARINUNFRC  --   --  0.22*  --  <0.10*  --   CREATININE 0.86  --   --  0.75  --  0.80    Estimated Creatinine Clearance: 113.1 mL/min (by C-G formula based on SCr of 0.8 mg/dL).   Medical History: Past Medical History:  Diagnosis Date  . Antiphospholipid antibody syndrome ([place])   . CAD S/P percutaneous coronary angioplasty '98, '04, Feb 2017   [facility]  . Carotid arterial disease ([place]) 10/2015   moderate bilateral  . COPD (chronic obstructive pulmonary disease) ([place])   . Coronary artery disease   . Hypertension   . Non-insulin treated type 2 diabetes mellitus ([place])   . Pulmonary emboli ([place]) 2011  . RBBB   . Sleep apnea     Assessment: 63 y.o. male with medical history significant of antiphospholipid antibody syndrome on Coumadin with an INR goal of 2.5-3.5, Heparin infusion bridge was stopped 12/30 AM for surgery.  Now is POD #1 s/p I&D left thigh, wound VAC application Plan for repeat I and D next Wed 03/22/19 INR 1.5 on 12/29,  hgb 13.0>11.6, pltc wnl stable I discussed patient's anticoagulatin plan with Dr. Avon Gully, [facility], who ordered to restart IV heparin bridge while off coumadin.    Goal of Therapy:  Heparin level 0.3-0.7  units/ml Monitor platelets by anticoagulation protocol: Yes   Plan:  Start heparin infusion at 1750 units/hr Check anti-Xa level in 6 hours and daily while on heparin Continue to monitor H&H and platelets   Nicole Cella, RPh Clinical Pharmacist Please see AMION for all Pharmacists' Contact Phone Numbers 03/16/2019, 11:43 AM

## 2019-03-16 NOTE — Plan of Care (Signed)
  Problem: Clinical Measurements: Goal: Ability to maintain clinical measurements within normal limits will improve Outcome: Progressing Goal: Postoperative complications will be avoided or minimized Outcome: Progressing   

## 2019-03-16 NOTE — Consult Note (Signed)
Date of Admission:  03/13/2019          Reason for Consult: Large thigh abscess   Referring Provider: Dr. Sharol Given   Assessment:  1. 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 Antiphospholipid syndrome on chronic anticoagulation 3. Diabetes mellitus  Plan:  1. Continue vancomycin for now 2. Follow-up intraoperative cultures 3. Agree with aggressive management of the wound with further surgeries 4. Given that he grew Staphylococcus lugdunensis which is notorious for causing endocarditis  I will check a 2D echocardiogram that would not push for transesophageal echocardiogram   Active Problems:   Anti-phospholipid syndrome (HCC)   Essential hypertension, benign   COPD (chronic obstructive pulmonary disease) (HCC)   Sleep apnea   CAD S/P multiple PCIs   Chronic anticoagulation-Couamdin   Non-insulin treated type 2 diabetes mellitus (HCC)   Abscess   Abscess of left thigh   Scheduled Meds: . albuterol  2 puff Inhalation Q4H  . aspirin  325 mg Oral Daily  . docusate sodium  100 mg Oral BID  . insulin aspart  0-15 Units Subcutaneous TID WC  . insulin aspart  0-5 Units Subcutaneous QHS  . pantoprazole  40 mg Oral BID  . Tiotropium Bromide-Olodaterol  2 puff Inhalation Daily  . topiramate  50 mg Oral BID  . torsemide  40 mg Oral Daily   Continuous Infusions: . heparin 1,750 Units/hr (03/16/19 1240)  . lactated ringers 10 mL/hr at 03/15/19 1709  . vancomycin 1,000 mg (03/16/19 0754)   PRN Meds:.acetaminophen **OR** acetaminophen, HYDROmorphone (DILAUDID) injection, metoCLOPramide **OR** metoCLOPramide (REGLAN) injection, ondansetron **OR** ondansetron (ZOFRAN) IV, oxyCODONE, senna-docusate  HPI: Jose [name] is a 63 y.o. 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  He states that he was never treated for infection as no infection was found.  Recently was referred to wound care here in [place] and was seen by Dr. Dellia Nims who had noted copious amounts of drainage that the patient had first noticed when it "blew up"  Culture taken from the area grew a methicillin sensitive Staphylococcus lugdunensis.  Dr. Dellia Nims called in doxycycline but the patient was concerned that he in fact needed more aggressive care and came to the emergency department.  He was ultimately seen and evaluated and had a CT scan performed  I reviewed the CT scan personally and it indeed does show a large thick-walled abscess measured by radiology at 10 x 6.5 x 2.5 cm communicating with an open wound in the skin.  There is fortunately no evidence of septic arthritis osteomyelitis or fasciitis.  The patient was taken the operating room by Dr. Sharol Given who performed extensive debridement of the left thigh and sent intraoperative cultures which so far showing a gram-positive cocci Axis as well as a gram-positive rod.  Patient is currently on vancomycin.  We will follow up these cultures and hopefully the patient can be discharged on oral antibiotics since he does not have evidence of osteomyelitis or septic joint.       Review of Systems: Review of Systems  Constitutional: Positive for malaise/fatigue. Negative  for chills, diaphoresis, fever and weight loss.  HENT: Negative for congestion, hearing loss, sore throat and tinnitus.   Eyes: Negative for blurred vision and double vision.  Respiratory: Negative for cough, sputum production, shortness of breath and wheezing.   Cardiovascular: Negative for chest pain, palpitations and leg swelling.  Gastrointestinal: Negative for  abdominal pain, blood in stool, constipation, diarrhea, heartburn, melena, nausea and vomiting.  Genitourinary: Negative for dysuria, flank pain and hematuria.  Musculoskeletal: Positive for joint pain. Negative for back pain, falls and myalgias.  Skin: Negative for itching and rash.  Neurological: Negative for dizziness, sensory change, focal weakness, loss of consciousness, weakness and headaches.  Endo/Heme/Allergies: Does not bruise/bleed easily.  Psychiatric/Behavioral: Negative for depression, memory loss and suicidal ideas. The patient is not nervous/anxious.     Past Medical History:  Diagnosis Date  . Antiphospholipid antibody syndrome (HCC)   . CAD S/P percutaneous coronary angioplasty '98, '04, Feb 2017   [facility]  . Carotid arterial disease (HCC) 10/2015   moderate bilateral  . COPD (chronic obstructive pulmonary disease) (HCC)   . Coronary artery disease   . Hypertension   . Non-insulin treated type 2 diabetes mellitus (HCC)   . Pulmonary emboli (HCC) 2011  . RBBB   . Sleep apnea     Social History   Tobacco Use  . Smoking status: Former Smoker    Packs/day: 0.50    Types: Cigarettes  . Smokeless tobacco: Never Used  . Tobacco comment: has started smoking again, willing to quit.  Substance Use Topics  . Alcohol use: No  . Drug use: No    Family History  Problem Relation Age of Onset  . CAD Father 50   Allergies  Allergen Reactions  . Melatonin Anxiety  . Ramipril     cough    OBJECTIVE: Blood pressure 105/67, pulse 98, temperature 98.6 F (37 C), temperature source Oral, resp. rate 16, height  (1.727 m), weight 108.8 kg, SpO2 98 %.  Physical Exam Constitutional:      General: He is not in acute distress.    Appearance: Normal appearance. He is well-developed. He is not ill-appearing or diaphoretic.  HENT:     Head: Normocephalic and atraumatic.     Right Ear: Hearing and external ear normal.     Left Ear: Hearing and external ear normal.      Nose: No nasal deformity or rhinorrhea.  Eyes:     General: No scleral icterus.    Conjunctiva/sclera: Conjunctivae normal.     Right eye: Right conjunctiva is not injected.     Left eye: Left conjunctiva is not injected.  Neck:     Vascular: No JVD.  Cardiovascular:     Rate and Rhythm: Normal rate and regular rhythm.     Heart sounds: Normal heart sounds, S1 normal and S2 normal. No murmur. No friction rub.  Pulmonary:     Effort: Pulmonary effort is normal. No respiratory distress.  Abdominal:     General: Bowel sounds are normal. There is no distension.     Palpations: Abdomen is soft.  Musculoskeletal:        General: Normal range of motion.     Right shoulder: Normal.     Left shoulder: Normal.     Cervical back: Normal range of motion and neck supple.     Right hip: Normal.     Left hip: Normal.     Right knee: Normal.     Left knee: Normal.  Lymphadenopathy:     Head:     Right side of head: No submandibular, preauricular or posterior auricular adenopathy.     Left side of head: No submandibular, preauricular or posterior auricular adenopathy.     Cervical: No cervical adenopathy.     Right cervical: No superficial or deep cervical adenopathy.    Left cervical: No superficial or deep cervical adenopathy.  Skin:    General: Skin is warm and dry.     Coloration: Skin is not pale.     Findings: No abrasion, bruising, ecchymosis, erythema, lesion or rash.     Nails: There is no clubbing.  Neurological:     General: No focal deficit present.     Mental Status: He is alert and oriented to person, place, and time.     Sensory: No sensory deficit.     Coordination: Coordination normal.     Gait: Gait normal.  Psychiatric:        Attention and Perception: He is attentive.        Mood and Affect: Mood normal.        Speech: Speech normal.        Behavior: Behavior normal. Behavior is cooperative.        Thought Content: Thought content normal.        Judgment: Judgment  normal.   leg dressed  Lab Results Lab Results  Component Value Date   WBC 11.4 (H) 03/16/2019   HGB 11.6 (L) 03/16/2019   HCT 36.7 (L) 03/16/2019   MCV 96.6 03/16/2019   PLT 323 03/16/2019    Lab Results  Component Value Date   CREATININE 0.80 03/16/2019   BUN 10 03/16/2019   NA 138 03/16/2019   K 3.8 03/16/2019   CL 102 03/16/2019   CO2 28 03/16/2019    Lab Results  Component Value Date   ALT 22 03/16/2019   AST 23 03/16/2019   ALKPHOS 49 03/16/2019   BILITOT 0.6 03/16/2019     Microbiology: Recent Results (from the past 240 hour(s))  Aerobic Culture (superficial specimen)     Status: None   Collection Time: 03/08/19  4:14 PM   Specimen: Wound  Result Value Ref Range Status   Specimen Description   Final    WOUND THIGH Performed at [facility], [address], [place], [place] [address]    Special Requests   Final    NONE Performed at [facility], [address], [place], [place] [address]    Gram Stain   Final    NO WBC SEEN RARE GRAM POSITIVE COCCI Performed at [facility], [address] [place], [place] [address]    Culture MODERATE STAPHYLOCOCCUS LUGDUNENSIS  Final   Report Status 03/11/2019 FINAL  Final   Organism ID, Bacteria STAPHYLOCOCCUS LUGDUNENSIS  Final      Susceptibility   Staphylococcus lugdunensis - MIC*    CIPROFLOXACIN <=0.5 SENSITIVE Sensitive     ERYTHROMYCIN >=8 RESISTANT Resistant     GENTAMICIN <=0.5 SENSITIVE Sensitive     OXACILLIN >=4 RESISTANT Resistant     TETRACYCLINE <=1 SENSITIVE Sensitive     VANCOMYCIN 1 SENSITIVE Sensitive     TRIMETH/SULFA <=10 SENSITIVE Sensitive     CLINDAMYCIN <=0.25 SENSITIVE Sensitive     RIFAMPIN <=0.5 SENSITIVE Sensitive     Inducible Clindamycin NEGATIVE Sensitive     * MODERATE STAPHYLOCOCCUS LUGDUNENSIS  Culture, blood (Routine x 2)     Status: None (Preliminary  result)   Collection Time: 03/13/19  5:10 PM   Specimen: BLOOD  Result Value  Ref Range Status   Specimen Description BLOOD RIGHT ANTECUBITAL  Final   Special Requests   Final    BOTTLES DRAWN AEROBIC AND ANAEROBIC Blood Culture adequate volume   Culture   Final    NO GROWTH 3 DAYS Performed at [facility], [address] [place], [place] [address]    Report Status PENDING  Incomplete  Culture, blood (Routine x 2)     Status: None (Preliminary result)   Collection Time: 03/13/19  5:15 PM   Specimen: BLOOD  Result Value Ref Range Status   Specimen Description BLOOD LEFT ANTECUBITAL  Final   Special Requests   Final    BOTTLES DRAWN AEROBIC AND ANAEROBIC Blood Culture adequate volume   Culture   Final    NO GROWTH 3 DAYS Performed at [facility], [address] [place], [place] [address]    Report Status PENDING  Incomplete  SARS CORONAVIRUS 2 (TAT 6-24 HRS) Nasopharyngeal Nasopharyngeal Swab     Status: None   Collection Time: 03/14/19  9:45 PM   Specimen: Nasopharyngeal Swab  Result Value Ref Range Status   SARS Coronavirus 2 NEGATIVE NEGATIVE Final    Comment: (NOTE) SARS-CoV-2 target nucleic acids are NOT DETECTED. The SARS-CoV-2 RNA is generally detectable in upper and lower respiratory specimens during the acute phase of infection. Negative results do not preclude SARS-CoV-2 infection, do not rule out co-infections with other pathogens, and should not be used as the sole basis for treatment or other patient management decisions. Negative results must be combined with clinical observations, patient history, and epidemiological information. The expected result is Negative. Fact Sheet for Patients: HairSlick.nohttps://www.fda.gov/media/138098/download Fact Sheet for Healthcare Providers: quierodirigir.comhttps://www.fda.gov/media/138095/download This test is not yet approved or cleared by the [place] FDA and  has been authorized for detection and/or diagnosis of SARS-CoV-2 by FDA under an Emergency Use Authorization (EUA). This EUA will remain  in effect  (meaning this test can be used) for the duration of the COVID-19 declaration under Section 56 4(b)(1) of the Act, 21 U.S.C. section 360bbb-3(b)(1), unless the authorization is terminated or revoked sooner. Performed at [facility], [address] [place], [place] [address]   Surgical pcr screen     Status: None   Collection Time: 03/15/19 10:38 AM   Specimen: Nasal Mucosa; Nasal Swab  Result Value Ref Range Status   MRSA, PCR NEGATIVE NEGATIVE Final   Staphylococcus aureus NEGATIVE NEGATIVE Final    Comment: (NOTE) The Xpert SA Assay (FDA approved for NASAL specimens in patients 63 years of age and older), is one component of a comprehensive surveillance program. It is not intended to diagnose infection nor to guide or monitor treatment. Performed at [facility], [address] [place], [place] [address]   Aerobic/Anaerobic Culture (surgical/deep wound)     Status: None (Preliminary result)   Collection Time: 03/15/19  4:52 PM   Specimen: Soft Tissue, Other  Result Value Ref Range Status   Specimen Description TISSUE LEFT THIGH  Final   Special Requests NONE  Final   Gram Stain   Final    ABUNDANT WBC PRESENT, PREDOMINANTLY PMN FEW GRAM POSITIVE COCCI RARE GRAM POSITIVE RODS    Culture   Final    TOO YOUNG TO READ Performed at [facility], [address] [place], [place] [address]    Report Status  PENDING  Incomplete    Acey Lav, MD [facility] [phone] pager  03/16/2019, 1:50 PM

## 2019-03-16 NOTE — Progress Notes (Signed)
ABI has been completed.   Preliminary results in CV Proc.   Abram Sander 03/16/2019 2:31 PM

## 2019-03-16 NOTE — Progress Notes (Signed)
  Echocardiogram 2D Echocardiogram has been performed.  Jose [name] M 03/16/2019, 11:03 AM

## 2019-03-17 ENCOUNTER — Encounter (HOSPITAL_COMMUNITY): Payer: Self-pay | Admitting: Internal Medicine

## 2019-03-17 DIAGNOSIS — Z978 Presence of other specified devices: Secondary | ICD-10-CM

## 2019-03-17 LAB — COMPREHENSIVE METABOLIC PANEL
ALT: 19 U/L (ref 0–44)
AST: 16 U/L (ref 15–41)
Albumin: 2.8 g/dL — ABNORMAL LOW (ref 3.5–5.0)
Alkaline Phosphatase: 48 U/L (ref 38–126)
Anion gap: 9 (ref 5–15)
BUN: 12 mg/dL (ref 8–23)
CO2: 29 mmol/L (ref 22–32)
Calcium: 8.6 mg/dL — ABNORMAL LOW (ref 8.9–10.3)
Chloride: 101 mmol/L (ref 98–111)
Creatinine, Ser: 0.77 mg/dL (ref 0.61–1.24)
GFR calc Af Amer: >60 mL/min (ref >60)
GFR calc non Af Amer: >60 mL/min (ref >60)
Glucose, Bld: 112 mg/dL — ABNORMAL HIGH (ref 70–99)
Potassium: 3.9 mmol/L (ref 3.5–5.1)
Sodium: 139 mmol/L (ref 135–145)
Total Bilirubin: 0.6 mg/dL (ref 0.3–1.2)
Total Protein: 6.4 g/dL — ABNORMAL LOW (ref 6.5–8.1)

## 2019-03-17 LAB — CBC
HCT: 35 % — ABNORMAL LOW (ref 39.0–52.0)
Hemoglobin: 11.2 g/dL — ABNORMAL LOW (ref 13.0–17.0)
MCH: 30.4 pg (ref 26.0–34.0)
MCHC: 32 g/dL (ref 30.0–36.0)
MCV: 95.1 fL (ref 80.0–100.0)
Platelets: 299 10*3/uL (ref 150–400)
RBC: 3.68 MIL/uL — ABNORMAL LOW (ref 4.22–5.81)
RDW: 14.4 % (ref 11.5–15.5)
WBC: 11.7 10*3/uL — ABNORMAL HIGH (ref 4.0–10.5)
nRBC: 0 % (ref 0.0–0.2)

## 2019-03-17 LAB — PROTIME-INR
INR: 1.1 (ref 0.8–1.2)
Prothrombin Time: 14.1 seconds (ref 11.4–15.2)

## 2019-03-17 LAB — HEPARIN LEVEL (UNFRACTIONATED)
Heparin Unfractionated: 0.25 IU/mL — ABNORMAL LOW (ref 0.30–0.70)
Heparin Unfractionated: 0.63 IU/mL (ref 0.30–0.70)
Heparin Unfractionated: 0.52 [IU]/mL (ref 0.30–0.70)

## 2019-03-17 LAB — GLUCOSE, CAPILLARY
Glucose-Capillary: 121 mg/dL — ABNORMAL HIGH (ref 70–99)
Glucose-Capillary: 123 mg/dL — ABNORMAL HIGH (ref 70–99)
Glucose-Capillary: 161 mg/dL — ABNORMAL HIGH (ref 70–99)
Glucose-Capillary: 163 mg/dL — ABNORMAL HIGH (ref 70–99)

## 2019-03-17 MED ORDER — CEFAZOLIN SODIUM-DEXTROSE 2-4 GM/100ML-% IV SOLN: 2.0000 g | INTRAVENOUS | Status: DC

## 2019-03-17 MED ORDER — ENSURE PRE-SURGERY PO LIQD
296.0000 mL | Freq: Once | ORAL | Status: AC
  Administered 2019-03-18: 03:00:00 296 mL via ORAL
  Filled 2019-03-17: qty 296

## 2019-03-17 MED ORDER — CEFAZOLIN SODIUM-DEXTROSE 2-4 GM/100ML-% IV SOLN
2.0000 g | INTRAVENOUS | Status: DC
  Filled 2019-03-17 (×2): qty 100

## 2019-03-17 MED ORDER — CHLORHEXIDINE GLUCONATE 4 % EX LIQD
60.0000 mL | Freq: Once | CUTANEOUS | Status: AC
  Administered 2019-03-18: 4 via TOPICAL

## 2019-03-17 MED ORDER — SODIUM CHLORIDE 0.9 % IV SOLN
2.0000 g | Freq: Three times a day (TID) | INTRAVENOUS | Status: DC
  Administered 2019-03-17 – 2019-03-19 (×6): 2 g via INTRAVENOUS
  Filled 2019-03-17 (×9): qty 2

## 2019-03-17 MED ORDER — CHLORHEXIDINE GLUCONATE 4 % EX LIQD
60.0000 mL | Freq: Once | CUTANEOUS | Status: AC
  Administered 2019-03-18: 4 via TOPICAL
  Filled 2019-03-17: qty 60

## 2019-03-17 MED ORDER — POVIDONE-IODINE 10 % EX SWAB
2.0000 "application " | Freq: Once | CUTANEOUS | Status: AC
  Administered 2019-03-18: 2 via TOPICAL

## 2019-03-17 MED ORDER — ALBUTEROL SULFATE HFA 108 (90 BASE) MCG/ACT IN AERS: 2.0000 | INHALATION_SPRAY | RESPIRATORY_TRACT | Status: DC | PRN

## 2019-03-17 NOTE — Progress Notes (Signed)
Progress Note   [name] NOM:[id number] DOB: 06-04-1955 DOA: 03/13/2019  I have briefly reviewed the patient's prior medical records in [facility]  PCP: [name], MD  Patient coming from: home  Chief Complaint: wound with discharge  HPI: Jose [name] is a 64 y.o. male with medical history significant of antiphospholipid antibody syndrome on Coumadin with an INR goal of 2.5-3.5, COPD, type 2 diabetes not on insulin, sleep apnea, hypertension and a chronic leg wound.  Per patient he has had this leg wound for the last 2 years but over the last week he has had increasing pain, redness, and copious discharge.  He has seen Dr. Leanord Hawking at a wound care center and had his wound culture which grew staph Lugdunensis.  His wound care physician called him in a course of doxycycline but patient has not yet started because he is concerned that he needs IV antibiotics and his inability to manage the drainage.  He denies fever.  In the ER a CT scan of his thigh was obtained that shows a thick walled abscess in the medial distal thigh communicating with an open wound on the skin with surrounding cellulitis.  Patient was initially seen by the general surgery team in the ER who recommended an orthopedic surgery consult given location and proximity to the knee joint.  Patient was seen by orthopedics in the ER and initial recommendations are for ABIs.  If his ABIs are within normal limits the plan is to operate for a washout and wound VAC placement.  Patient's situation is complicated by his need for Coumadin and the need for a heparin bridge.  Subjective: No acute issues or events overnight, pain moderately well controlled, improving daily but not yet resolved.  Patient looking forward to increasing range of motion and possible ambulation later today.  Otherwise declines fevers, chills, nausea, vomiting, diarrhea, constipation, headache.  Assessment/Plan Active Problems:   Anti-phospholipid syndrome  (HCC)   Essential hypertension, benign   COPD (chronic obstructive pulmonary disease) (HCC)   Sleep apnea   CAD S/P multiple PCIs   Chronic anticoagulation-Couamdin   Non-insulin treated type 2 diabetes mellitus (HCC)   Abscess   Acute on chronic complex leg wound with abscess, POA -Left medial thigh, does not meet sepsis criteria -CT scan shows a thickened walled abscess with a communicating opening to the skin and cellulitis. -Orthopedic surgery following, appreciate insight recommendations, tolerated debridement on 03/15/19 with wound VAC placement without incident.  -Repeat debridement tentatively planned for 03/18/19 per Ortho - ID on board - recommending echo - unfortunately poorly visualized; will consider TEE after further discussion with ID (patient seems hesitant) -Currently on vancomycin, will de-escalate pending cultures.   Morbid obesity Estimated body mass index is 36.49 kg/m as calculated from the following:   Height as of 12/27/18: 5\' 8"  (1.727 m).   Weight as of this encounter: 108.9 kg.   Type II diabetes: not on insulin -Hemoglobin A1c 6.9 -Sliding scale insulin  Antiphospholipid syndrome -On Coumadin with a goal of 2.5-3.5 -Resume heparin gtt per pharmacy - defer to ortho for peri-operative anticoagulation. Will place back on coumadin once completed surgical intervention(s). Lab Results  Component Value Date   INR 1.1 03/17/2019   INR 1.1 03/16/2019   INR 1.5 (H) 03/14/2019   Chronic respiratory failure due to COPD, without acute exacerbation -Wears 2L at home  Hypertension -Resume home medications as tolerated -Currently holding home medications given current blood pressure  DVT prophylaxis: heparin gtt while INR <  2.5 and perioperative - defer to orthopedics for timing of re-initiation of coumadin (once deemed safe after surgery) Code Status: full  Disposition Plan:  Consults called: orthopedics and general surgery    Past Medical History:   Diagnosis Date  . Antiphospholipid antibody syndrome (HCC)   . CAD S/P percutaneous coronary angioplasty '98, '04, Feb 2017   [facility]  . Carotid arterial disease (HCC) 10/2015   moderate bilateral  . COPD (chronic obstructive pulmonary disease) (HCC)   . Coronary artery disease   . Hypertension   . Non-insulin treated type 2 diabetes mellitus (HCC)   . Pulmonary emboli (HCC) 2011  . RBBB   . Sleep apnea     Past Surgical History:  Procedure Laterality Date  . APPENDECTOMY    . BACK SURGERY  '89, '06  . COLONOSCOPY    . CORONARY ANGIOPLASTY WITH STENT PLACEMENT  '98, '04, Feb 2017  . I & D EXTREMITY Left 03/15/2019   Procedure: DEBRIDEMENT LEFT THIGH;  Surgeon: Nadara Mustard, MD;  Location: [facility] OR;  Service: Orthopedics;  Laterality: Left;     reports that he has quit smoking. His smoking use included cigarettes. He smoked 0.50 packs per day. He has never used smokeless tobacco. He reports that he does not drink alcohol or use drugs.  Allergies  Allergen Reactions  . Melatonin Anxiety  . Ramipril     cough    Family History  Problem Relation Age of Onset  . CAD Father 62    Prior to Admission medications   Medication Sig Start Date End Date Taking? Authorizing Provider  albuterol (PROVENTIL) (5 MG/ML) 0.5% nebulizer solution Take 0.5 mLs (2.5 mg total) by nebulization every 6 (six) hours as needed for wheezing or shortness of breath. 12/27/18   Terald Sleeper, MD  ALPRAZolam Prudy Feeler) 0.5 MG tablet TAKE ONE TABLET (0.5 MG TOTAL) BY MOUTH 3 (THREE) TIMES A DAY AS NEEDED FOR SLEEP OR ANXIETY. 09/13/17   [provider]  aspirin EC 81 MG tablet Take 81 mg by mouth daily.    [provider]  Blood Glucose Monitoring Suppl (GLUCOCOM BLOOD GLUCOSE MONITOR) DEVI 1 each by XX route as directed (For Blood Glucose Monitoring) 08/23/17   [provider]  colchicine 0.6 MG tablet Take 1 tablet (0.6 mg total) by mouth 2 (two) times daily. Patient not taking:  Reported on 10/04/2017 02/12/17   Shon Hale, MD  furosemide (LASIX) 40 MG tablet Take 1 tablet (40 mg total) by mouth daily. Patient not taking: Reported on 10/04/2017 02/12/17   Shon Hale, MD  isosorbide mononitrate (IMDUR) 60 MG 24 hr tablet Take 1 tablet (60 mg total) by mouth daily. Patient not taking: Reported on 10/04/2017 02/13/17   Shon Hale, MD  methylPREDNISolone (MEDROL DOSEPAK) 4 MG TBPK tablet Use as directed on package 12/27/18   Terald Sleeper, MD  metoprolol succinate (TOPROL-XL) 100 MG 24 hr tablet Take 100 mg by mouth daily. 08/04/16   [provider]  metoprolol tartrate (LOPRESSOR) 12.5 mg TABS tablet Take by mouth. 08/22/17 08/22/18  [provider]  nitroGLYCERIN (NITROSTAT) 0.4 MG SL tablet Place 0.4 mg under the tongue every 5 (five) minutes as needed for chest pain.  07/07/16   [provider]  pantoprazole (PROTONIX) 40 MG tablet Take 40 mg by mouth 2 (two) times daily. 06/03/16   [provider]  polyethylene glycol (MIRALAX / GLYCOLAX) packet Take 17 g by mouth daily. Patient not taking: Reported on 10/04/2017  02/12/17   Shon Hale, MD  potassium chloride (MICRO-K) 10 MEQ CR capsule Take by mouth. 08/22/17 08/22/18  [provider]  PROAIR HFA 108 (90 Base) MCG/ACT inhaler Inhale 1-2 puffs into the lungs every 4 (four) hours as needed for wheezing.  07/07/16   [provider]  rosuvastatin (CRESTOR) 10 MG tablet Take 10 mg by mouth every evening. 07/07/16   [provider]  tamsulosin (FLOMAX) 0.4 MG CAPS capsule Take 0.4 mg by mouth daily. 06/05/16   [provider]  Tiotropium Bromide-Olodaterol (STIOLTO RESPIMAT) 2.5-2.5 MCG/ACT AERS Inhale into the lungs. 09/10/17   [provider]  topiramate (TOPAMAX) 50 MG tablet Take 50 mg by mouth 2 (two) times daily. 06/05/16   [provider]  torsemide (DEMADEX) 20 MG tablet Take by mouth. 07/05/17 07/05/18  [provider]  warfarin (COUMADIN) 5 MG tablet Take 5-7.5 mg by mouth See admin instructions. Take 7.5 mg on Monday, Wednesday and Friday then take 5 mg all the other days 07/17/16   [provider]    Physical Exam: Vitals:   03/16/19 2041 03/16/19 2208 03/17/19 0531 03/17/19 0828  BP: (!) 95/39 127/64 115/64   Pulse: 93 91 84   Resp:      Temp: 98.9 F (37.2 C)  98.5 F (36.9 C)   TempSrc: Oral  Oral   SpO2: 97%  97% 97%  Weight:      Height:          Constitutional: NAD, calm, comfortable, obese male ENMT: Mucous membranes are moist.  Neck: normal, supple, no masses Respiratory:  Normal respiratory effort. No accessory muscle use.  Cardiovascular: Regular rate and rhythm, + LE edema Abdomen: no tenderness, no masses palpated. Bowel sounds positive.  Musculoskeletal: no clubbing / cyanosis. Normal muscle tone.  Skin: Left leg postoperative bandage and wound VAC intact without leak, draining dark sanguinous fluid. Neurologic: moves all 4 ext without deficit, left lower extremity movement limited by pain  Labs on Admission: I have personally reviewed following labs and imaging studies  CBC: Recent Labs  Lab 03/13/19 1710 03/15/19 0451 03/16/19 0316 03/17/19 0213  WBC 10.1 9.6 11.4* 11.7*  NEUTROABS 6.1  --   --   --   HGB 13.0 13.0 11.6* 11.2*  HCT 39.6 39.7 36.7* 35.0*  MCV 94.5 95.4 96.6 95.1  PLT 353 330 323 299   Basic Metabolic Panel: Recent Labs  Lab 03/13/19 1710 03/15/19 0451 03/16/19 0316 03/17/19 0213  NA 140 140 138 139  K 3.8 3.4* 3.8 3.9  CL 104 101 102 101  CO2 24 28 28 29   GLUCOSE 113* 122* 113* 112*  BUN 15 9 10 12   CREATININE 0.86 0.75 0.80 0.77  CALCIUM 9.2 8.7* 8.3* 8.6*   GFR: Estimated Creatinine Clearance: 111.6 mL/min (by C-G formula based on SCr of 0.77 mg/dL). Liver Function Tests: Recent Labs  Lab 03/13/19 1710 03/16/19 0316 03/17/19 0213  AST 26 23 16   ALT 24 22 19   ALKPHOS 52 49 48  BILITOT 0.2* 0.6 0.6  PROT 6.7 6.1*  6.4*  ALBUMIN 3.2* 2.9* 2.8*   No results for input(s): LIPASE, AMYLASE in the last 168 hours. No results for input(s): AMMONIA in the last 168 hours. Coagulation Profile: Recent Labs  Lab 03/13/19 1710 03/14/19 1636 03/16/19 1856 03/17/19 0213  INR 2.0* 1.5* 1.1 1.1   Cardiac Enzymes: No results for input(s): CKTOTAL, CKMB, CKMBINDEX, TROPONINI in the last 168 hours. BNP (last 3 results) No  results for input(s): PROBNP in the last 8760 hours. HbA1C: Recent Labs    03/15/19 0451  HGBA1C 6.9*   CBG: Recent Labs  Lab 03/16/19 1126 03/16/19 1649 03/16/19 2117 03/17/19 0735 03/17/19 1155  GLUCAP 169* 138* 176* 121* 123*   Lipid Profile: No results for input(s): CHOL, HDL, LDLCALC, TRIG, CHOLHDL, LDLDIRECT in the last 72 hours. Thyroid Function Tests: No results for input(s): TSH, T4TOTAL, FREET4, T3FREE, THYROIDAB in the last 72 hours. Anemia Panel: No results for input(s): VITAMINB12, FOLATE, FERRITIN, TIBC, IRON, RETICCTPCT in the last 72 hours. Urine analysis: No results found for: COLORURINE, APPEARANCEUR, LABSPEC, PHURINE, GLUCOSEU, HGBUR, BILIRUBINUR, KETONESUR, PROTEINUR, UROBILINOGEN, NITRITE, LEUKOCYTESUR   Radiological Exams on Admission: VAS US ABI WITH/WO TBI  Result Date: 03/16/2019 LOWER EXTREMITY DOPPLER STUDY Indications: Ulceration. High Risk Factors: Hypertension, coronary artery disease.  Limitations: Today's exam was limited due to bandages. Comparison Study: no prior Performing Technologist: Blanch MediaMegan Riddle RVS  Examination Guidelines: A complete evaluation includes at minimum, Doppler waveform signals and systolic blood pressure reading at the level of bilateral brachial, anterior tibial, and posterior tibial arteries, when vessel segments are accessible. Bilateral testing is considered an integral part of a complete examination. Photoelectric Plethysmograph (PPG) waveforms and toe systolic pressure readings are included as required and additional duplex  testing as needed. Limited examinations for reoccurring indications may be performed as noted.  ABI Findings: +--------+------------------+-----+---------+--------+ Right   Rt Pressure (mmHg)IndexWaveform Comment  +--------+------------------+-----+---------+--------+ [id number]                    triphasic         +--------+------------------+-----+---------+--------+ PTA     104               0.74 biphasic          +--------+------------------+-----+---------+--------+ DP      79                0.56 biphasic          +--------+------------------+-----+---------+--------+ +--------+------------------+-----+---------+--------------------------------+ Left    Lt Pressure (mmHg)IndexWaveform Comment                          +--------+------------------+-----+---------+--------------------------------+ [id number]                    triphasic                                 +--------+------------------+-----+---------+--------------------------------+ PTA                                     unable to obtain due to bandages +--------+------------------+-----+---------+--------------------------------+ DP      123               0.87 biphasic                                  +--------+------------------+-----+---------+--------------------------------+ +-------+-----------+-----------+------------+------------+ ABI/TBIToday's ABIToday's TBIPrevious ABIPrevious TBI +-------+-----------+-----------+------------+------------+ Right  0.74                                           +-------+-----------+-----------+------------+------------+ Left   0.87                                           +-------+-----------+-----------+------------+------------+  Summary: Right: Resting right ankle-brachial index indicates moderate right lower extremity arterial disease. Left: Resting left ankle-brachial index indicates mild left lower extremity arterial disease.  *See  table(s) above for measurements and observations.  Electronically signed by Waverly Ferrari MD on 03/16/2019 at 3:08:50 PM.   Final    ECHOCARDIOGRAM COMPLETE  Result Date: 03/16/2019   ECHOCARDIOGRAM REPORT   Patient Name:   Landon Crysler Date of Exam: 03/16/2019 Medical Rec #:  [id number]     Height:       68.0 in Accession #:    [id number]    Weight:       239.9 lb Date of Birth:  28-Mar-1955      BSA:          2.21 m Patient Age:    63 years      BP:           105/67 mmHg Patient Gender: M             HR:           98 bpm. Exam Location:  Inpatient Procedure: 2D Echo Indications:    Endocarditis I38  History:        Patient has prior history of Echocardiogram examinations, most                 recent 02/11/2017. CAD, Prior CABG, COPD, Arrythmias:RBBB; Risk                 Factors:Hypertension, Diabetes, Sleep Apnea and Former Smoker.                 Abscess of the left thigh. Carotid artery disease.  Sonographer:    Leta Jungling RDCS Referring Phys: [id number] CORNELIUS N VAN DAM  Sonographer Comments: Suboptimal parasternal window, suboptimal apical window and suboptimal subcostal window. Patient choose not to use definity IMPRESSIONS  1. Left ventricular ejection fraction, by visual estimation, is 55 to 60%. The left ventricle has normal function. There is mildly increased left ventricular hypertrophy.  2. Left ventricular diastolic parameters are consistent with Grade I diastolic dysfunction (impaired relaxation).  3. Global right ventricle was not well visualized.The right ventricular size is not well visualized. Right vetricular wall thickness was not assessed.  4. Left atrial size was not well visualized.  5. Right atrial size was not well visualized.  6. The mitral valve was not well visualized. Trivial mitral valve regurgitation.  7. The tricuspid valve is not well visualized.  8. Aortic valve area, by VTI measures 1.35 cm.  9. Aortic valve mean gradient measures 10.0 mmHg. 10. Aortic valve peak  gradient measures 24.6 mmHg. 11. The aortic valve was not well visualized. Aortic valve regurgitation is not visualized. Mild to moderate aortic valve stenosis. 12. The pulmonic valve was not well visualized. Pulmonic valve regurgitation is not visualized. 13. The interatrial septum was not well visualized. 14. Poor endocardial definition, suboptimal echo windows. Patient refused Definity contrast. Study is inadequate to r/o endocarditis. Consider TEE if strong clinical suspicion. FINDINGS  Left Ventricle: Left ventricular ejection fraction, by visual estimation, is 55 to 60%. The left ventricle has normal function. The left ventricle is not well visualized. There is mildly increased left ventricular hypertrophy. Left ventricular diastolic  parameters are consistent with Grade I diastolic dysfunction (impaired relaxation). Indeterminate filling pressures. Right Ventricle: The right ventricular size is not well visualized. Right vetricular wall thickness was not assessed. Global RV systolic function is was not well visualized. Left Atrium: Left atrial  size was not well visualized. Right Atrium: Right atrial size was not well visualized Pericardium: There is no evidence of pericardial effusion. Mitral Valve: The mitral valve was not well visualized. Trivial mitral valve regurgitation. Tricuspid Valve: The tricuspid valve is not well visualized. Tricuspid valve regurgitation is not demonstrated. Aortic Valve: The aortic valve was not well visualized. Aortic valve regurgitation is not visualized. Mild to moderate aortic stenosis is present. Aortic valve mean gradient measures 10.0 mmHg. Aortic valve peak gradient measures 24.6 mmHg. Aortic valve area, by VTI measures 1.35 cm. Pulmonic Valve: The pulmonic valve was not well visualized. Pulmonic valve regurgitation is not visualized. Pulmonic regurgitation is not visualized. Aorta: The aortic root and ascending aorta are structurally normal, with no evidence of  dilitation. IAS/Shunts: The interatrial septum was not well visualized.  LEFT VENTRICLE PLAX 2D LVIDd:         5.20 cm  Diastology LVIDs:         4.10 cm  LV e' lateral:   8.38 cm/s LV PW:         1.10 cm  LV E/e' lateral: 10.8 LV IVS:        1.30 cm  LV e' medial:    7.29 cm/s LVOT diam:     1.90 cm  LV E/e' medial:  12.4 LV SV:         55 ml LV SV Index:   23.74 LVOT Area:     2.84 cm  LEFT ATRIUM         Index LA diam:    3.30 cm 1.49 cm/m  AORTIC VALVE AV Area (Vmax):    0.90 cm AV Area (Vmean):   1.11 cm AV Area (VTI):     1.35 cm AV Vmax:           248.00 cm/s AV Vmean:          141.000 cm/s AV VTI:            0.277 m AV Peak Grad:      24.6 mmHg AV Mean Grad:      10.0 mmHg LVOT Vmax:         78.30 cm/s LVOT Vmean:        55.100 cm/s LVOT VTI:          0.132 m LVOT/AV VTI ratio: 0.48  AORTA Ao Asc diam: 3.10 cm MITRAL VALVE MV Area (PHT): 4.15 cm             SHUNTS MV PHT:        53.07 msec           Systemic VTI:  0.13 m MV Decel Time: 183 msec             Systemic Diam: 1.90 cm MV E velocity: 90.40 cm/s 103 cm/s MV A velocity: 93.80 cm/s 70.3 cm/s MV E/A ratio:  0.96       1.5  Lyman Bishop MD Electronically signed by Lyman Bishop MD Signature Date/Time: 03/16/2019/11:24:53 AM    Final      Little Ishikawa DO [facility]   How to contact the [facility] Attending or Consulting provider [place] or covering provider during after hours [place], for this patient?  1. Check the care team in [facility] and look for a) attending/consulting [facility] provider listed and b) the [facility] team listed 2. Log into [email or web address] and use [place]'s universal password to access. If you do not have the password, please contact the  hospital operator. 3. Locate the [facility] provider you are looking for under [facility] and page to a number that you can be directly reached. 4. If you still have difficulty reaching the provider, please page the [facility] (Director on Call) for the Hospitalists listed on amion for  assistance.  03/17/2019, 12:58 PM

## 2019-03-17 NOTE — Progress Notes (Signed)
ANTICOAGULATION CONSULT NOTE - Follow Up Consult  Pharmacy Consult for Heparin Indication: Antiphospholipid Syndrome  Allergies  Allergen Reactions  . Melatonin Anxiety  . Ramipril     cough    Patient Measurements: Height: 5\' 8"  (172.7 cm) Weight: 239 lb 13.8 oz (108.8 kg) IBW/kg (Calculated) : 68.4 Heparin Dosing Weight: 92.5  Vital Signs: Temp: 98.5 F (36.9 C) (01/01 1537) Temp Source: Oral (01/01 1537) BP: 111/60 (01/01 1537) Pulse Rate: 89 (01/01 1537)  Labs: Recent Labs    03/15/19 0451 03/16/19 0316 03/16/19 1856 03/17/19 0213 03/17/19 1316 03/17/19 1910  HGB 13.0 11.6*  --  11.2*  --   --   HCT 39.7 36.7*  --  35.0*  --   --   PLT 330 323  --  299  --   --   LABPROT  --   --  14.5 14.1  --   --   INR  --   --  1.1 1.1  --   --   HEPARINUNFRC  --   --  0.22* 0.25* 0.63 0.52  CREATININE 0.75 0.80  --  0.77  --   --     Estimated Creatinine Clearance: 111.6 mL/min (by C-G formula based on SCr of 0.77 mg/dL).   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 Heparin was started for bridging while warfarin on hold and INR<2.   The patient is s/p I&D on 12/30 with Heparin resumed post-op.  PM heparin level stable    Goal of Therapy:  Heparin level 0.3-0.7 units/ml Monitor platelets by anticoagulation protocol: Yes   Plan:  Continue heparin 2150 units/hr Daily HL, INR, CBC   2151, PharmD Please check AMION for all [facility] Pharmacy phone numbers After 10:00 PM, call Main Pharmacy [phone]

## 2019-03-17 NOTE — Progress Notes (Signed)
ANTICOAGULATION CONSULT NOTE - Follow Up Consult  Pharmacy Consult for Heparin Indication: Antiphospholipid Syndrome  Allergies  Allergen Reactions  . Melatonin Anxiety  . Ramipril     cough    Patient Measurements: Height: 5\' 8"  (172.7 cm) Weight: 239 lb 13.8 oz (108.8 kg) IBW/kg (Calculated) : 68.4 Heparin Dosing Weight: 92.5  Vital Signs: Temp: 98.5 F (36.9 C) (01/01 0531) Temp Source: Oral (01/01 0531) BP: 115/64 (01/01 0531) Pulse Rate: 84 (01/01 0531)  Labs: Recent Labs    03/14/19 1636 03/15/19 0100 03/15/19 0451 03/16/19 0316 03/16/19 1856 03/17/19 0213 03/17/19 1316  HGB  --    < > 13.0 11.6*  --  11.2*  --   HCT  --   --  39.7 36.7*  --  35.0*  --   PLT  --   --  330 323  --  299  --   LABPROT 18.3*  --   --   --  14.5 14.1  --   INR 1.5*  --   --   --  1.1 1.1  --   HEPARINUNFRC  --   --   --   --  0.22* 0.25* 0.63  CREATININE  --   --  0.75 0.80  --  0.77  --    < > = values in this interval not displayed.    Estimated Creatinine Clearance: 111.6 mL/min (by C-G formula based on SCr of 0.77 mg/dL).   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 Heparin was started for bridging while warfarin on hold and INR<2.   The patient is s/p I&D on 12/30 with Heparin resumed post-op. Heparin level is 0.63, therapeutic on heparin 2150 units/hr.   CBC stable.  No bleeding noted.  Ortho notes plan to return to OR tomorrow Saturday for repeat debridement.   Goal of Therapy:  Heparin level 0.3-0.7 units/ml Monitor platelets by anticoagulation protocol: Yes   Plan:  Continue heparin 2150 units/hr Re-check heparin level in 6 hours to confirm remains at goal. Daily HL, INR, CBC   2151, RPh Clinical Pharmacist Please check AMION for all [facility] Pharmacy phone numbers After 10:00 PM, call Main Pharmacy [phone]

## 2019-03-17 NOTE — Progress Notes (Signed)
Patient ID: Jose [name], male   DOB: [birth year], 64 y.o.   MRN: [id number] Patient is status post debridement abscess left hip.  There is 400 cc in the wound VAC canister cultures are showing gram-positive rods and gram-positive cocci.  Plan for return to the operating room tomorrow Saturday for repeat debridement.

## 2019-03-17 NOTE — H&P (View-Only) (Signed)
Patient ID: Jose [name], male   DOB: 04/08/1955, 64 y.o.   MRN: [id number] Patient is status post debridement abscess left hip.  There is 400 cc in the wound VAC canister cultures are showing gram-positive rods and gram-positive cocci.  Plan for return to the operating room tomorrow Saturday for repeat debridement. 

## 2019-03-17 NOTE — Progress Notes (Signed)
Subjective: No new complaints   Antibiotics:  Anti-infectives (From admission, onward)   Start     Dose/Rate Route Frequency Ordered Stop   03/15/19 1630  ceFAZolin (ANCEF) IVPB 2g/100 mL premix     2 g 200 mL/hr over 30 Minutes Intravenous On call to O.R. 03/15/19 1016 03/15/19 1617   03/14/19 2030  vancomycin (VANCOCIN) IVPB 1000 mg/200 mL premix     1,000 mg 200 mL/hr over 60 Minutes Intravenous Every 12 hours 03/14/19 0742     03/14/19 0730  vancomycin (VANCOREADY) IVPB 2000 mg/400 mL     2,000 mg 200 mL/hr over 120 Minutes Intravenous  Once 03/14/19 0723 03/14/19 1043      Medications: Scheduled Meds: . aspirin  325 mg Oral Daily  . docusate sodium  100 mg Oral BID  . insulin aspart  0-15 Units Subcutaneous TID WC  . insulin aspart  0-5 Units Subcutaneous QHS  . pantoprazole  40 mg Oral BID  . Tiotropium Bromide-Olodaterol  2 puff Inhalation Daily  . topiramate  50 mg Oral BID  . torsemide  40 mg Oral Daily   Continuous Infusions: . heparin 2,150 Units/hr (03/17/19 0424)  . lactated ringers 10 mL/hr at 03/15/19 1709  . vancomycin 1,000 mg (03/17/19 0759)   PRN Meds:.acetaminophen **OR** acetaminophen, albuterol, HYDROmorphone (DILAUDID) injection, metoCLOPramide **OR** metoCLOPramide (REGLAN) injection, ondansetron **OR** ondansetron (ZOFRAN) IV, oxyCODONE, senna-docusate    Objective: Weight change:   Intake/Output Summary (Last 24 hours) at 03/17/2019 1543 Last data filed at 03/17/2019 1537 Gross per 24 hour  Intake 480 ml  Output 2900 ml  Net -2420 ml   Blood pressure 111/60, pulse 89, temperature 98.5 F (36.9 C), temperature source Oral, resp. rate 19, height 5\' 8"  (1.727 m), weight 108.8 kg, SpO2 96 %. Temp:  [98.5 F (36.9 C)-98.9 F (37.2 C)] 98.5 F (36.9 C) (01/01 1537) Pulse Rate:  [84-93] 89 (01/01 1537) Resp:  [19] 19 (01/01 1537) BP: (95-127)/(39-64) 111/60 (01/01 1537) SpO2:  [96 %-97 %] 96 % (01/01 1537)  Physical  Exam: General: Alert and awake, oriented x3, not in any acute distress. HEENT: anicteric sclera, EOMI CVS regular rate, normal  Chest: , no wheezing, no respiratory distress Abdomen: soft non-distended,  Extremities:drain in place Neuro: nonfocal  CBC:    BMET Recent Labs    03/16/19 0316 03/17/19 0213  NA 138 139  K 3.8 3.9  CL 102 101  CO2 28 29  GLUCOSE 113* 112*  BUN 10 12  CREATININE 0.80 0.77  CALCIUM 8.3* 8.6*     Liver Panel  Recent Labs    03/16/19 0316 03/17/19 0213  PROT 6.1* 6.4*  ALBUMIN 2.9* 2.8*  AST 23 16  ALT 22 19  ALKPHOS 49 48  BILITOT 0.6 0.6       Sedimentation Rate No results for input(s): ESRSEDRATE in the last 72 hours. C-Reactive Protein No results for input(s): CRP in the last 72 hours.  Micro Results: Recent Results (from the past 720 hour(s))  Aerobic Culture (superficial specimen)     Status: None   Collection Time: 03/08/19  4:14 PM   Specimen: Wound  Result Value Ref Range Status   Specimen Description   Final    WOUND THIGH Performed at [facility], [address], [place], [place]    Special Requests   Final    NONE Performed at [facility], [address] [place], [place]  Gram Stain   Final    NO WBC SEEN RARE GRAM POSITIVE COCCI Performed at [facility], [place] [address] [place], [place] [address]    Culture MODERATE STAPHYLOCOCCUS LUGDUNENSIS  Final   Report Status 03/11/2019 FINAL  Final   Organism ID, Bacteria STAPHYLOCOCCUS LUGDUNENSIS  Final      Susceptibility   Staphylococcus lugdunensis - MIC*    CIPROFLOXACIN <=0.5 SENSITIVE Sensitive     ERYTHROMYCIN >=8 RESISTANT Resistant     GENTAMICIN <=0.5 SENSITIVE Sensitive     OXACILLIN >=4 RESISTANT Resistant     TETRACYCLINE <=1 SENSITIVE Sensitive     VANCOMYCIN 1 SENSITIVE Sensitive     TRIMETH/SULFA <=10 SENSITIVE Sensitive     CLINDAMYCIN <=0.25 SENSITIVE Sensitive      RIFAMPIN <=0.5 SENSITIVE Sensitive     Inducible Clindamycin NEGATIVE Sensitive     * MODERATE STAPHYLOCOCCUS LUGDUNENSIS  Culture, blood (Routine x 2)     Status: None (Preliminary result)   Collection Time: 03/13/19  5:10 PM   Specimen: BLOOD  Result Value Ref Range Status   Specimen Description BLOOD RIGHT ANTECUBITAL  Final   Special Requests   Final    BOTTLES DRAWN AEROBIC AND ANAEROBIC Blood Culture adequate volume   Culture   Final    NO GROWTH 4 DAYS Performed at [facility], [address] [place], [place] [address]    Report Status PENDING  Incomplete  Culture, blood (Routine x 2)     Status: None (Preliminary result)   Collection Time: 03/13/19  5:15 PM   Specimen: BLOOD  Result Value Ref Range Status   Specimen Description BLOOD LEFT ANTECUBITAL  Final   Special Requests   Final    BOTTLES DRAWN AEROBIC AND ANAEROBIC Blood Culture adequate volume   Culture   Final    NO GROWTH 4 DAYS Performed at [facility], [place] [address], [place], [place] [address]    Report Status PENDING  Incomplete  SARS CORONAVIRUS 2 (TAT 6-24 HRS) Nasopharyngeal Nasopharyngeal Swab     Status: None   Collection Time: 03/14/19  9:45 PM   Specimen: Nasopharyngeal Swab  Result Value Ref Range Status   SARS Coronavirus 2 NEGATIVE NEGATIVE Final    Comment: (NOTE) SARS-CoV-2 target nucleic acids are NOT DETECTED. The SARS-CoV-2 RNA is generally detectable in upper and lower respiratory specimens during the acute phase of infection. Negative results do not preclude SARS-CoV-2 infection, do not rule out co-infections with other pathogens, and should not be used as the sole basis for treatment or other patient management decisions. Negative results must be combined with clinical observations, patient history, and epidemiological information. The expected result is Negative. Fact Sheet for Patients: SugarRoll.be Fact Sheet for Healthcare  Providers: https://www.woods-mathews.com/ This test is not yet approved or cleared by the [place] FDA and  has been authorized for detection and/or diagnosis of SARS-CoV-2 by FDA under an Emergency Use Authorization (EUA). This EUA will remain  in effect (meaning this test can be used) for the duration of the COVID-19 declaration under Section 56 4(b)(1) of the Act, 21 U.S.C. section 360bbb-3(b)(1), unless the authorization is terminated or revoked sooner. Performed at [facility], [place] [address] [place], [place] [address]   Surgical pcr screen     Status: None   Collection Time: 03/15/19 10:38 AM   Specimen: Nasal Mucosa; Nasal Swab  Result Value Ref Range Status   MRSA, PCR NEGATIVE NEGATIVE Final  Staphylococcus aureus NEGATIVE NEGATIVE Final    Comment: (NOTE) The Xpert SA Assay (FDA approved for NASAL specimens in patients 22 years of age and older), is one component of a comprehensive surveillance program. It is not intended to diagnose infection nor to guide or monitor treatment. Performed at [facility], [address], [place], [place] [address]   Aerobic/Anaerobic Culture (surgical/deep wound)     Status: Abnormal (Preliminary result)   Collection Time: 03/15/19  4:52 PM   Specimen: Soft Tissue, Other  Result Value Ref Range Status   Specimen Description LEFT THIGH  Final   Special Requests NONE  Final   Gram Stain   Final    ABUNDANT WBC PRESENT, PREDOMINANTLY PMN FEW GRAM POSITIVE COCCI RARE GRAM POSITIVE RODS Performed at [facility], [address] [place], [place] [address]    Culture (A)  Final    MULTIPLE ORGANISMS PRESENT, NONE PREDOMINANT NO ANAEROBES ISOLATED; CULTURE IN PROGRESS FOR 5 DAYS    Report Status PENDING  Incomplete    Studies/Results: VAS [place] ABI WITH/WO TBI  Result Date: 03/16/2019 LOWER EXTREMITY DOPPLER STUDY Indications: Ulceration. High Risk Factors: Hypertension, coronary artery disease.   Limitations: Today's exam was limited due to bandages. Comparison Study: no prior Performing Technologist: Blanch Media RVS  Examination Guidelines: A complete evaluation includes at minimum, Doppler waveform signals and systolic blood pressure reading at the level of bilateral brachial, anterior tibial, and posterior tibial arteries, when vessel segments are accessible. Bilateral testing is considered an integral part of a complete examination. Photoelectric Plethysmograph (PPG) waveforms and toe systolic pressure readings are included as required and additional duplex testing as needed. Limited examinations for reoccurring indications may be performed as noted.  ABI Findings: +--------+------------------+-----+---------+--------+ Right   Rt Pressure (mmHg)IndexWaveform Comment  +--------+------------------+-----+---------+--------+ [id number]                    triphasic         +--------+------------------+-----+---------+--------+ PTA     104               0.74 biphasic          +--------+------------------+-----+---------+--------+ DP      79                0.56 biphasic          +--------+------------------+-----+---------+--------+ +--------+------------------+-----+---------+--------------------------------+ Left    Lt Pressure (mmHg)IndexWaveform Comment                          +--------+------------------+-----+---------+--------------------------------+ [id number]                    triphasic                                 +--------+------------------+-----+---------+--------------------------------+ PTA                                     unable to obtain due to bandages +--------+------------------+-----+---------+--------------------------------+ DP      123               0.87 biphasic                                  +--------+------------------+-----+---------+--------------------------------+  +-------+-----------+-----------+------------+------------+ ABI/TBIToday's ABIToday's  TBIPrevious ABIPrevious TBI +-------+-----------+-----------+------------+------------+ Right  0.74                                           +-------+-----------+-----------+------------+------------+ Left   0.87                                           +-------+-----------+-----------+------------+------------+  Summary: Right: Resting right ankle-brachial index indicates moderate right lower extremity arterial disease. Left: Resting left ankle-brachial index indicates mild left lower extremity arterial disease.  *See table(s) above for measurements and observations.  Electronically signed by Waverly Ferrarihristopher Dickson MD on 03/16/2019 at 3:08:50 PM.   Final    ECHOCARDIOGRAM COMPLETE  Result Date: 03/16/2019   ECHOCARDIOGRAM REPORT   Patient Name:   Jose [name] Date of Exam: 03/16/2019 Medical Rec #:  [id number]     Height:       68.0 in Accession #:    [id number]    Weight:       239.9 lb Date of Birth:  11/19/1955      BSA:          2.21 m Patient Age:    63 years      BP:           105/67 mmHg Patient Gender: M             HR:           98 bpm. Exam Location:  Inpatient Procedure: 2D Echo Indications:    Endocarditis I38  History:        Patient has prior history of Echocardiogram examinations, most                 recent 02/11/2017. CAD, Prior CABG, COPD, Arrythmias:RBBB; Risk                 Factors:Hypertension, Diabetes, Sleep Apnea and Former Smoker.                 Abscess of the left thigh. Carotid artery disease.  Sonographer:    Leta Junglingiffany Cooper RDCS Referring Phys: [id number] Rithik Odea N VAN [name]  Sonographer Comments: Suboptimal parasternal window, suboptimal apical window and suboptimal subcostal window. Patient choose not to use definity IMPRESSIONS  1. Left ventricular ejection fraction, by visual estimation, is 55 to 60%. The left ventricle has normal function. There is mildly increased left ventricular  hypertrophy.  2. Left ventricular diastolic parameters are consistent with Grade I diastolic dysfunction (impaired relaxation).  3. Global right ventricle was not well visualized.The right ventricular size is not well visualized. Right vetricular wall thickness was not assessed.  4. Left atrial size was not well visualized.  5. Right atrial size was not well visualized.  6. The mitral valve was not well visualized. Trivial mitral valve regurgitation.  7. The tricuspid valve is not well visualized.  8. Aortic valve area, by VTI measures 1.35 cm.  9. Aortic valve mean gradient measures 10.0 mmHg. 10. Aortic valve peak gradient measures 24.6 mmHg. 11. The aortic valve was not well visualized. Aortic valve regurgitation is not visualized. Mild to moderate aortic valve stenosis. 12. The pulmonic valve was not well visualized. Pulmonic valve regurgitation is not visualized. 13. The interatrial septum was not well visualized. 14. Poor endocardial definition, suboptimal echo windows. Patient  refused Definity contrast. Study is inadequate to r/o endocarditis. Consider TEE if strong clinical suspicion. FINDINGS  Left Ventricle: Left ventricular ejection fraction, by visual estimation, is 55 to 60%. The left ventricle has normal function. The left ventricle is not well visualized. There is mildly increased left ventricular hypertrophy. Left ventricular diastolic  parameters are consistent with Grade I diastolic dysfunction (impaired relaxation). Indeterminate filling pressures. Right Ventricle: The right ventricular size is not well visualized. Right vetricular wall thickness was not assessed. Global RV systolic function is was not well visualized. Left Atrium: Left atrial size was not well visualized. Right Atrium: Right atrial size was not well visualized Pericardium: There is no evidence of pericardial effusion. Mitral Valve: The mitral valve was not well visualized. Trivial mitral valve regurgitation. Tricuspid Valve: The  tricuspid valve is not well visualized. Tricuspid valve regurgitation is not demonstrated. Aortic Valve: The aortic valve was not well visualized. Aortic valve regurgitation is not visualized. Mild to moderate aortic stenosis is present. Aortic valve mean gradient measures 10.0 mmHg. Aortic valve peak gradient measures 24.6 mmHg. Aortic valve area, by VTI measures 1.35 cm. Pulmonic Valve: The pulmonic valve was not well visualized. Pulmonic valve regurgitation is not visualized. Pulmonic regurgitation is not visualized. Aorta: The aortic root and ascending aorta are structurally normal, with no evidence of dilitation. IAS/Shunts: The interatrial septum was not well visualized.  LEFT VENTRICLE PLAX 2D LVIDd:         5.20 cm  Diastology LVIDs:         4.10 cm  LV e' lateral:   8.38 cm/s LV PW:         1.10 cm  LV E/e' lateral: 10.8 LV IVS:        1.30 cm  LV e' medial:    7.29 cm/s LVOT diam:     1.90 cm  LV E/e' medial:  12.4 LV SV:         55 ml LV SV Index:   23.74 LVOT Area:     2.84 cm  LEFT ATRIUM         Index LA diam:    3.30 cm 1.49 cm/m  AORTIC VALVE AV Area (Vmax):    0.90 cm AV Area (Vmean):   1.11 cm AV Area (VTI):     1.35 cm AV Vmax:           248.00 cm/s AV Vmean:          141.000 cm/s AV VTI:            0.277 m AV Peak Grad:      24.6 mmHg AV Mean Grad:      10.0 mmHg LVOT Vmax:         78.30 cm/s LVOT Vmean:        55.100 cm/s LVOT VTI:          0.132 m LVOT/AV VTI ratio: 0.48  AORTA Ao Asc diam: 3.10 cm MITRAL VALVE MV Area (PHT): 4.15 cm             SHUNTS MV PHT:        53.07 msec           Systemic VTI:  0.13 m MV Decel Time: 183 msec             Systemic Diam: 1.90 cm MV E velocity: 90.40 cm/s 103 cm/s MV A velocity: 93.80 cm/s 70.3 cm/s MV E/A ratio:  0.96       1.5  Zoila ShutterKenneth Hilty MD Electronically signed by Zoila ShutterKenneth Hilty MD Signature Date/Time: 03/16/2019/11:24:53 AM    Final       Assessment/Plan:  INTERVAL HISTORY:  I called micro lab and there is a likely proteus growing on  plate (swarming on culture) a coag negative staph and possibly a corynebacterium   Active Problems:   Anti-phospholipid syndrome (HCC)   Essential hypertension, benign   COPD (chronic obstructive pulmonary disease) (HCC)   Sleep apnea   CAD S/P multiple PCIs   Chronic anticoagulation-Couamdin   Non-insulin treated type 2 diabetes mellitus (HCC)   Abscess   Abscess of left thigh    Jose [name] is a 64 y.o. male with  Chronic thigh seroma [facility] which became overtly infected and burst with cultures done form [facility] --> staph lugdunensis, now sp I  And D by Dr. Lajoyce Cornersuda [facility]  #1 Thigh abscess:  --I will add cefepime to the vancomycin for now --followup culture data --we should be able to send him home on a targetted rational oral antibiotic regimen  Dr Luciana Axeomer to followup culture data this weekend and may make final recs if sensis back.    LOS: 3 days   Jose [name] 03/17/2019, 3:43 PM

## 2019-03-17 NOTE — Progress Notes (Signed)
ANTICOAGULATION CONSULT NOTE - Follow Up Consult  Pharmacy Consult for Heparin Indication: Antiphospholipid Syndrome  Allergies  Allergen Reactions  . Melatonin Anxiety  . Ramipril     cough    Patient Measurements: Height: 5\' 8"  (172.7 cm) Weight: 239 lb 13.8 oz (108.8 kg) IBW/kg (Calculated) : 68.4 Heparin Dosing Weight: 92.5  Vital Signs: Temp: 98.9 F (37.2 C) (12/31 2041) Temp Source: Oral (12/31 2041) BP: 127/64 (12/31 2208) Pulse Rate: 91 (12/31 2208)  Labs: Recent Labs    03/14/19 1636 03/15/19 0100 03/15/19 0451 03/15/19 0937 03/16/19 0316 03/16/19 1856 03/17/19 0213  HGB  --    < > 13.0  --  11.6*  --  11.2*  HCT  --   --  39.7  --  36.7*  --  35.0*  PLT  --   --  330  --  323  --  299  LABPROT 18.3*  --   --   --   --  14.5 14.1  INR 1.5*  --   --   --   --  1.1 1.1  HEPARINUNFRC  --   --   --  <0.10*  --  0.22* 0.25*  CREATININE  --   --  0.75  --  0.80  --   --    < > = values in this interval not displayed.    Estimated Creatinine Clearance: 111.6 mL/min (by C-G formula based on SCr of 0.8 mg/dL).   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 Heparin was started for bridging while warfarin on hold and INR<2.   The patient is s/p I&D on 12/30 with Heparin resumed post-op. Heparin level this evening is SUBtherapeutic.   1/1 AM update:  Heparin level just below goal No issues per RN  Goal of Therapy:  Heparin level 0.3-0.7 units/ml Monitor platelets by anticoagulation protocol: Yes   Plan:  Inc heparin to 2150 units/hr Re-check heparin level in 6-8 hours  2151, PharmD, BCPS Clinical Pharmacist Phone: [phone]

## 2019-03-18 ENCOUNTER — Encounter (HOSPITAL_COMMUNITY)
Admission: EM | Disposition: A | Discharge status: Home Health | Payer: Self-pay | Source: Home / Self Care | Attending: Internal Medicine

## 2019-03-18 ENCOUNTER — Inpatient Hospital Stay (HOSPITAL_COMMUNITY): Payer: BC Managed Care – PPO | Admitting: Anesthesiology

## 2019-03-18 DIAGNOSIS — L0291 Cutaneous abscess, unspecified: Secondary | ICD-10-CM

## 2019-03-18 HISTORY — PX: INCISION AND DRAINAGE OF WOUND: SHX1803

## 2019-03-18 HISTORY — PX: APPLICATION OF WOUND VAC: SHX5189

## 2019-03-18 LAB — COMPREHENSIVE METABOLIC PANEL
ALT: 21 U/L (ref 0–44)
AST: 19 U/L (ref 15–41)
Albumin: 2.8 g/dL — ABNORMAL LOW (ref 3.5–5.0)
Alkaline Phosphatase: 45 U/L (ref 38–126)
Anion gap: 12 (ref 5–15)
BUN: 19 mg/dL (ref 8–23)
CO2: 27 mmol/L (ref 22–32)
Calcium: 8.4 mg/dL — ABNORMAL LOW (ref 8.9–10.3)
Chloride: 101 mmol/L (ref 98–111)
Creatinine, Ser: 0.9 mg/dL (ref 0.61–1.24)
GFR calc Af Amer: >60 mL/min (ref >60)
GFR calc non Af Amer: >60 mL/min (ref >60)
Glucose, Bld: 115 mg/dL — ABNORMAL HIGH (ref 70–99)
Potassium: 3.7 mmol/L (ref 3.5–5.1)
Sodium: 140 mmol/L (ref 135–145)
Total Bilirubin: 0.5 mg/dL (ref 0.3–1.2)
Total Protein: 6.4 g/dL — ABNORMAL LOW (ref 6.5–8.1)

## 2019-03-18 LAB — CBC
HCT: 35 % — ABNORMAL LOW (ref 39.0–52.0)
Hemoglobin: 11.3 g/dL — ABNORMAL LOW (ref 13.0–17.0)
MCH: 30.2 pg (ref 26.0–34.0)
MCHC: 32.3 g/dL (ref 30.0–36.0)
MCV: 93.6 fL (ref 80.0–100.0)
Platelets: 310 10*3/uL (ref 150–400)
RBC: 3.74 MIL/uL — ABNORMAL LOW (ref 4.22–5.81)
RDW: 14.1 % (ref 11.5–15.5)
WBC: 10.6 10*3/uL — ABNORMAL HIGH (ref 4.0–10.5)
nRBC: 0 % (ref 0.0–0.2)

## 2019-03-18 LAB — CULTURE, BLOOD (ROUTINE X 2)
Culture: NO GROWTH
Culture: NO GROWTH
Special Requests: ADEQUATE
Special Requests: ADEQUATE

## 2019-03-18 LAB — GLUCOSE, CAPILLARY
Glucose-Capillary: 194 mg/dL — ABNORMAL HIGH (ref 70–99)
Glucose-Capillary: 111 mg/dL — ABNORMAL HIGH (ref 70–99)
Glucose-Capillary: 181 mg/dL — ABNORMAL HIGH (ref 70–99)
Glucose-Capillary: 191 mg/dL — ABNORMAL HIGH (ref 70–99)
Glucose-Capillary: 302 mg/dL — ABNORMAL HIGH (ref 70–99)

## 2019-03-18 LAB — HEPARIN LEVEL (UNFRACTIONATED)
Heparin Unfractionated: 0.47 IU/mL (ref 0.30–0.70)
Heparin Unfractionated: 0.4 IU/mL (ref 0.30–0.70)

## 2019-03-18 LAB — PROTIME-INR
INR: 1.2 (ref 0.8–1.2)
Prothrombin Time: 14.6 seconds (ref 11.4–15.2)

## 2019-03-18 SURGERY — IRRIGATION AND DEBRIDEMENT WOUND
Anesthesia: General | Site: Leg Upper | Laterality: Left

## 2019-03-18 MED ORDER — METOCLOPRAMIDE HCL 5 MG PO TABS: 5.0000 mg | ORAL_TABLET | Freq: Three times a day (TID) | ORAL | Status: DC | PRN

## 2019-03-18 MED ORDER — FENTANYL CITRATE (PF) 250 MCG/5ML IJ SOLN
INTRAMUSCULAR | Status: DC | PRN
  Administered 2019-03-18: 100 ug via INTRAVENOUS

## 2019-03-18 MED ORDER — PHENYLEPHRINE 40 MCG/ML (10ML) SYRINGE FOR IV PUSH (FOR BLOOD PRESSURE SUPPORT)
PREFILLED_SYRINGE | INTRAVENOUS | Status: DC | PRN
  Administered 2019-03-18: 80 ug via INTRAVENOUS
  Administered 2019-03-18: 120 ug via INTRAVENOUS
  Administered 2019-03-18: 80 ug via INTRAVENOUS
  Administered 2019-03-18: 120 ug via INTRAVENOUS

## 2019-03-18 MED ORDER — ONDANSETRON HCL 4 MG/2ML IJ SOLN: 4.0000 mg | Freq: Four times a day (QID) | INTRAMUSCULAR | Status: DC | PRN

## 2019-03-18 MED ORDER — SODIUM CHLORIDE 0.9 % IV SOLN: INTRAVENOUS | Status: DC

## 2019-03-18 MED ORDER — DEXAMETHASONE SODIUM PHOSPHATE 10 MG/ML IJ SOLN
INTRAMUSCULAR | Status: AC
  Filled 2019-03-18: qty 1

## 2019-03-18 MED ORDER — DEXAMETHASONE SODIUM PHOSPHATE 10 MG/ML IJ SOLN
INTRAMUSCULAR | Status: DC | PRN
  Administered 2019-03-18: 4 mg via INTRAVENOUS

## 2019-03-18 MED ORDER — LACTATED RINGERS IV SOLN: INTRAVENOUS | Status: DC

## 2019-03-18 MED ORDER — STERILE WATER FOR IRRIGATION IR SOLN
Status: DC | PRN
  Administered 2019-03-18: 1000 mL

## 2019-03-18 MED ORDER — MIDAZOLAM HCL 2 MG/2ML IJ SOLN
INTRAMUSCULAR | Status: DC | PRN
  Administered 2019-03-18: 2 mg via INTRAVENOUS

## 2019-03-18 MED ORDER — FENTANYL CITRATE (PF) 100 MCG/2ML IJ SOLN: 25.0000 ug | INTRAMUSCULAR | Status: DC | PRN

## 2019-03-18 MED ORDER — POLYETHYLENE GLYCOL 3350 17 G PO PACK: 17.0000 g | PACK | Freq: Every day | ORAL | Status: DC | PRN

## 2019-03-18 MED ORDER — FENTANYL CITRATE (PF) 250 MCG/5ML IJ SOLN
INTRAMUSCULAR | Status: AC
  Filled 2019-03-18: qty 5

## 2019-03-18 MED ORDER — 0.9 % SODIUM CHLORIDE (POUR BTL) OPTIME
TOPICAL | Status: DC | PRN
  Administered 2019-03-18 (×2): 1000 mL

## 2019-03-18 MED ORDER — PROPOFOL 10 MG/ML IV BOLUS
INTRAVENOUS | Status: AC
  Filled 2019-03-18: qty 20

## 2019-03-18 MED ORDER — PROPOFOL 10 MG/ML IV BOLUS
INTRAVENOUS | Status: DC | PRN
  Administered 2019-03-18: 150 mg via INTRAVENOUS

## 2019-03-18 MED ORDER — ONDANSETRON HCL 4 MG/2ML IJ SOLN
INTRAMUSCULAR | Status: DC | PRN
  Administered 2019-03-18: 4 mg via INTRAVENOUS

## 2019-03-18 MED ORDER — ALBUMIN HUMAN 5 % IV SOLN: INTRAVENOUS | Status: DC | PRN

## 2019-03-18 MED ORDER — LIDOCAINE 2% (20 MG/ML) 5 ML SYRINGE
INTRAMUSCULAR | Status: AC
  Filled 2019-03-18: qty 5

## 2019-03-18 MED ORDER — MAGNESIUM CITRATE PO SOLN: 1.0000 | Freq: Once | ORAL | Status: DC | PRN

## 2019-03-18 MED ORDER — LIDOCAINE 2% (20 MG/ML) 5 ML SYRINGE
INTRAMUSCULAR | Status: DC | PRN
  Administered 2019-03-18: 50 mg via INTRAVENOUS

## 2019-03-18 MED ORDER — DOCUSATE SODIUM 100 MG PO CAPS
100.0000 mg | ORAL_CAPSULE | Freq: Two times a day (BID) | ORAL | Status: DC
  Administered 2019-03-18 – 2019-03-22 (×9): 100 mg via ORAL
  Filled 2019-03-18 (×9): qty 1

## 2019-03-18 MED ORDER — METHOCARBAMOL 500 MG PO TABS: 500.0000 mg | ORAL_TABLET | Freq: Four times a day (QID) | ORAL | Status: DC | PRN

## 2019-03-18 MED ORDER — MIDAZOLAM HCL 2 MG/2ML IJ SOLN
INTRAMUSCULAR | Status: AC
  Filled 2019-03-18: qty 2

## 2019-03-18 MED ORDER — OXYCODONE HCL 5 MG/5ML PO SOLN: 5.0000 mg | Freq: Once | ORAL | Status: DC | PRN

## 2019-03-18 MED ORDER — ONDANSETRON HCL 4 MG PO TABS: 4.0000 mg | ORAL_TABLET | Freq: Four times a day (QID) | ORAL | Status: DC | PRN

## 2019-03-18 MED ORDER — PHENYLEPHRINE 40 MCG/ML (10ML) SYRINGE FOR IV PUSH (FOR BLOOD PRESSURE SUPPORT)
PREFILLED_SYRINGE | INTRAVENOUS | Status: AC
  Filled 2019-03-18: qty 10

## 2019-03-18 MED ORDER — OXYCODONE HCL 5 MG PO TABS: 5.0000 mg | ORAL_TABLET | Freq: Once | ORAL | Status: DC | PRN

## 2019-03-18 MED ORDER — BISACODYL 10 MG RE SUPP: 10.0000 mg | Freq: Every day | RECTAL | Status: DC | PRN

## 2019-03-18 MED ORDER — ONDANSETRON HCL 4 MG/2ML IJ SOLN
INTRAMUSCULAR | Status: AC
  Filled 2019-03-18: qty 2

## 2019-03-18 MED ORDER — METHOCARBAMOL 1000 MG/10ML IJ SOLN
500.0000 mg | Freq: Four times a day (QID) | INTRAVENOUS | Status: DC | PRN
  Filled 2019-03-18 (×2): qty 5

## 2019-03-18 MED ORDER — METOCLOPRAMIDE HCL 5 MG/ML IJ SOLN: 5.0000 mg | Freq: Three times a day (TID) | INTRAMUSCULAR | Status: DC | PRN

## 2019-03-18 SURGICAL SUPPLY — 26 items
BLADE SURG 21 STRL SS (BLADE) ×2 IMPLANT
BNDG COHESIVE 6X5 TAN STRL LF (GAUZE/BANDAGES/DRESSINGS) IMPLANT
BNDG GAUZE ELAST 4 BULKY (GAUZE/BANDAGES/DRESSINGS) ×2 IMPLANT
COVER SURGICAL LIGHT HANDLE (MISCELLANEOUS) ×3 IMPLANT
DRAPE INCISE IOBAN 66X45 STRL (DRAPES) ×1 IMPLANT
DRAPE U-SHAPE 47X51 STRL (DRAPES) ×2 IMPLANT
DRESSING PREVENA PLUS CUSTOM (GAUZE/BANDAGES/DRESSINGS) IMPLANT
DRSG PREVENA PLUS CUSTOM (GAUZE/BANDAGES/DRESSINGS) ×2
DURAPREP 26ML APPLICATOR (WOUND CARE) ×2 IMPLANT
ELECT REM PT RETURN 9FT ADLT (ELECTROSURGICAL) ×2
ELECTRODE REM PT RTRN 9FT ADLT (ELECTROSURGICAL) IMPLANT
GLOVE BIOGEL PI IND STRL 9 (GLOVE) ×1 IMPLANT
GLOVE BIOGEL PI INDICATOR 9 (GLOVE) ×1
GLOVE SURG ORTHO 9.0 STRL STRW (GLOVE) ×2 IMPLANT
GOWN STRL REUS W/ TWL XL LVL3 (GOWN DISPOSABLE) ×2 IMPLANT
GOWN STRL REUS W/TWL XL LVL3 (GOWN DISPOSABLE) ×1
KIT BASIN OR (CUSTOM PROCEDURE TRAY) ×2 IMPLANT
KIT DRSG PREVENA PLUS 7DAY 125 (MISCELLANEOUS) ×1 IMPLANT
KIT TURNOVER KIT B (KITS) ×2 IMPLANT
NS IRRIG 1000ML POUR BTL (IV SOLUTION) ×2 IMPLANT
PACK ORTHO EXTREMITY (CUSTOM PROCEDURE TRAY) ×2 IMPLANT
SET INTERPULSE LAVAGE W/TIP (ORTHOPEDIC DISPOSABLE SUPPLIES) ×1 IMPLANT
SUT ETHILON 2 0 PSLX (SUTURE) ×3 IMPLANT
TOWEL GREEN STERILE (TOWEL DISPOSABLE) ×2 IMPLANT
TUBE CONNECTING 12X1/4 (SUCTIONS) ×2 IMPLANT
YANKAUER SUCT BULB TIP NO VENT (SUCTIONS) ×2 IMPLANT

## 2019-03-18 NOTE — Progress Notes (Signed)
His culture is growing GNR and will wait for ID of organism.  On empiric therapy and anticipate transition to oral therapy at discharge.   Gardiner Barefoot, MD

## 2019-03-18 NOTE — Anesthesia Procedure Notes (Signed)
Procedure Name: LMA Insertion Date/Time: 03/18/2019 7:58 AM Performed by: Adria Dill, CRNA Pre-anesthesia Checklist: Patient identified, Emergency Drugs available, Suction available and Patient being monitored Patient Re-evaluated:Patient Re-evaluated prior to induction Oxygen Delivery Method: Circle system utilized Preoxygenation: Pre-oxygenation with 100% oxygen Induction Type: IV induction LMA: LMA inserted LMA Size: 5.0 Tube type: Oral Number of attempts: 1 Airway Equipment and Method: Oral airway Placement Confirmation: positive ETCO2 and breath sounds checked- equal and bilateral Tube secured with: Tape Dental Injury: Teeth and Oropharynx as per pre-operative assessment

## 2019-03-18 NOTE — Progress Notes (Addendum)
Pharmacy Antibiotic Note  Jose [name] is a 64 y.o. male admitted on 03/13/2019 with wound infection/abscess with outpatient culture 12/23 growing staph lugdunensis. Patient prescribed doxy PTA but has not started yet. Pharmacy has been consulted for vancomycin dosing.  Day 5 abx. Pt is s/p I&D left thigh with wound VAC application on 12/30, then repeat I&D on 1/2. Pt is afebrile, WBC trending down to 10.6. SCr remains stable at 0.9.  ID is consulted and added cefepime on 1/1, expect to come up with more targeted abx plan this weekend.  Plan: Continue vancomycin 1 gm IV q12h Continue cefepime 2 gm IV q8h Monitor clinical progress, c/s, renal function F/u de-escalation plan/LOT, vancomycin levels as indicated   Height: 5\' 8"  (172.7 cm) Weight: 239 lb 13.8 oz (108.8 kg) IBW/kg (Calculated) : 68.4  Temp (24hrs), Avg:98.6 F (37 C), Min:98.1 F (36.7 C), Max:98.9 F (37.2 C)  Recent Labs  Lab 03/13/19 1704 03/13/19 1710 03/13/19 2237 03/15/19 0451 03/16/19 0316 03/17/19 0213 03/18/19 0208  WBC  --  10.1  --  9.6 11.4* 11.7* 10.6*  CREATININE  --  0.86  --  0.75 0.80 0.77 0.90  LATICACIDVEN 3.6*  --  1.8  --   --   --   --     Estimated Creatinine Clearance: 99.2 mL/min (by C-G formula based on SCr of 0.9 mg/dL).    Allergies  Allergen Reactions  . Melatonin Anxiety  . Ramipril     cough   Antimicrobials this admission: 12/29 Vanc >> 1/1 Cefepime >>  Microbiology this admission: 12/23 outpt wound cx: Staph lugdunensis: S to vanc, cipro, clinda, Bactrim 12/28 BCx: ngtd 12/30 wound tissue L thigh Cx: GPC, GPR >> multiple organisms present, no anaerobes isolated >> 12/29 Covid: neg 12/30 Surgical PCR: MRSA PCR neg, SA neg   1/31, PharmD PGY1 Pharmacy Resident  Please check AMION for all [facility] Pharmacy phone numbers After 10:00 PM, call Main Pharmacy [phone] 03/18/2019 10:53 AM

## 2019-03-18 NOTE — Progress Notes (Signed)
Progress Note   Jose [name] YQI:[id number] DOB: 12-16-1955 DOA: 03/13/2019  I have briefly reviewed the patient's prior medical records in [facility]  PCP: Elenore Paddy, MD  Patient coming from: home  Chief Complaint: wound with discharge  HPI: Jose [name] is a 64 y.o. male with medical history significant of antiphospholipid antibody syndrome on Coumadin with an INR goal of 2.5-3.5, COPD, type 2 diabetes not on insulin, sleep apnea, hypertension and a chronic leg wound.  Per patient he has had this leg wound for the last 2 years but over the last week he has had increasing pain, redness, and copious discharge.  He has seen Dr. Dellia Nims at a wound care center and had his wound culture which grew staph Lugdunensis.  His wound care physician called him in a course of doxycycline but patient has not yet started because he is concerned that he needs IV antibiotics and his inability to manage the drainage.  He denies fever.  In the ER a CT scan of his thigh was obtained that shows a thick walled abscess in the medial distal thigh communicating with an open wound on the skin with surrounding cellulitis.  Patient was initially seen by the general surgery team in the ER who recommended an orthopedic surgery consult given location and proximity to the knee joint.  Patient was seen by orthopedics in the ER and initial recommendations are for ABIs.  If his ABIs are within normal limits the plan is to operate for a washout and wound VAC placement.  Patient's situation is complicated by his need for Coumadin and the need for a heparin bridge.  Subjective: No acute issues or events overnight, tolerated procedure quite well today, pain controlled.  Denies fevers, chills, nausea, vomiting.  Assessment/Plan Active Problems:   Anti-phospholipid syndrome (HCC)   Essential hypertension, benign   COPD (chronic obstructive pulmonary disease) (HCC)   Sleep apnea   CAD S/P multiple PCIs   Chronic  anticoagulation-Couamdin   Non-insulin treated type 2 diabetes mellitus (HCC)   Abscess  Acute on chronic complex leg wound with abscess, POA -Left medial thigh, does not meet sepsis criteria -CT scan shows a thickened walled abscess with a communicating opening to the skin and cellulitis. -Orthopedic surgery following, appreciate insight recommendations, tolerated debridement on 03/15/19 with wound VAC placement without incident.  -Repeat debridement tolerated well 03/18/19 per Ortho - ID on board - recommending echo - unfortunately poorly visualized; will consider TEE after further discussion with ID (patient seems hesitant) -Currently on vancomycin, cefepime, will de-escalate pending cultures -Previously grew staph lugdunensis at wound care outpatient clinic.   Morbid obesity Estimated body mass index is 36.49 kg/m as calculated from the following:   Height as of 12/27/18: 5\' 8"  (1.727 m).   Weight as of this encounter: 108.9 kg.   Type II diabetes: not on insulin -Hemoglobin A1c 6.9 -Sliding scale insulin  Antiphospholipid syndrome -On Coumadin with a goal of 2.5-3.5 -Resume heparin gtt per pharmacy - should be able to resume coumadin as early as 03/19/19 per ortho (if no bleeding complications/issues in the next 24h) Lab Results  Component Value Date   INR 1.2 03/18/2019   INR 1.1 03/17/2019   INR 1.1 03/16/2019   Chronic respiratory failure due to COPD, without acute exacerbation -Wears 2L at home around the clock  Hypertension -Resume home medications as tolerated -Currently holding home medications given current blood pressure  DVT prophylaxis: heparin gtt while INR <2.5 and perioperative - resume coumadin  03/19/19 if no post-op complications/bleeding Code Status: full  Disposition Plan:  Consults called: orthopedics and general surgery    Past Medical History:  Diagnosis Date  . Antiphospholipid antibody syndrome (HCC)   . CAD S/P percutaneous coronary angioplasty  '98, '04, Feb 2017   [facility]  . Carotid arterial disease (HCC) 10/2015   moderate bilateral  . COPD (chronic obstructive pulmonary disease) (HCC)   . Coronary artery disease   . Hypertension   . Non-insulin treated type 2 diabetes mellitus (HCC)   . Pulmonary emboli (HCC) 2011  . RBBB   . Sleep apnea     Past Surgical History:  Procedure Laterality Date  . APPENDECTOMY    . BACK SURGERY  '89, '06  . COLONOSCOPY    . CORONARY ANGIOPLASTY WITH STENT PLACEMENT  '98, '04, Feb 2017  . I & D EXTREMITY Left 03/15/2019   Procedure: DEBRIDEMENT LEFT THIGH;  Surgeon: Nadara Mustarduda, Marcus V, MD;  [facility];  Service: Orthopedics;  Laterality: Left;     reports that he quit smoking about 2 months ago. His smoking use included cigarettes. He smoked 0.50 packs per day. He has never used smokeless tobacco. He reports that he does not drink alcohol or use drugs.  Allergies  Allergen Reactions  . Melatonin Anxiety  . Ramipril     cough    Family History  Problem Relation Age of Onset  . CAD Father 4172    Prior to Admission medications   Medication Sig Start Date End Date Taking? Authorizing Provider  albuterol (PROVENTIL) (5 MG/ML) 0.5% nebulizer solution Take 0.5 mLs (2.5 mg total) by nebulization every 6 (six) hours as needed for wheezing or shortness of breath. 12/27/18   Terald Sleeperrifan, Matthew J, MD  ALPRAZolam Prudy Feeler(XANAX) 0.5 MG tablet TAKE ONE TABLET (0.5 MG TOTAL) BY MOUTH 3 (THREE) TIMES A DAY AS NEEDED FOR SLEEP OR ANXIETY. 09/13/17   [provider]  aspirin EC 81 MG tablet Take 81 mg by mouth daily.    [provider]  Blood Glucose Monitoring Suppl (GLUCOCOM BLOOD GLUCOSE MONITOR) DEVI 1 each by XX route as directed (For Blood Glucose Monitoring) 08/23/17   [provider]  colchicine 0.6 MG tablet Take 1 tablet (0.6 mg total) by mouth 2 (two) times daily. Patient not taking: Reported on 10/04/2017 02/12/17   Shon HaleEmokpae, Courage, MD  furosemide (LASIX) 40 MG tablet Take 1  tablet (40 mg total) by mouth daily. Patient not taking: Reported on 10/04/2017 02/12/17   Shon HaleEmokpae, Courage, MD  isosorbide mononitrate (IMDUR) 60 MG 24 hr tablet Take 1 tablet (60 mg total) by mouth daily. Patient not taking: Reported on 10/04/2017 02/13/17   Shon HaleEmokpae, Courage, MD  methylPREDNISolone (MEDROL DOSEPAK) 4 MG TBPK tablet Use as directed on package 12/27/18   Terald Sleeperrifan, Matthew J, MD  metoprolol succinate (TOPROL-XL) 100 MG 24 hr tablet Take 100 mg by mouth daily. 08/04/16   [provider]  metoprolol tartrate (LOPRESSOR) 12.5 mg TABS tablet Take by mouth. 08/22/17 08/22/18  [provider]  nitroGLYCERIN (NITROSTAT) 0.4 MG SL tablet Place 0.4 mg under the tongue every 5 (five) minutes as needed for chest pain.  07/07/16   [provider]  pantoprazole (PROTONIX) 40 MG tablet Take 40 mg by mouth 2 (two) times daily. 06/03/16   [provider]  polyethylene glycol (MIRALAX / GLYCOLAX) packet Take 17 g by mouth daily. Patient not taking: Reported on 10/04/2017 02/12/17   Shon HaleEmokpae, Courage, MD  potassium chloride (MICRO-K)  10 MEQ CR capsule Take by mouth. 08/22/17 08/22/18  [provider]  PROAIR HFA 108 (90 Base) MCG/ACT inhaler Inhale 1-2 puffs into the lungs every 4 (four) hours as needed for wheezing.  07/07/16   [provider]  rosuvastatin (CRESTOR) 10 MG tablet Take 10 mg by mouth every evening. 07/07/16   [provider]  tamsulosin (FLOMAX) 0.4 MG CAPS capsule Take 0.4 mg by mouth daily. 06/05/16   [provider]  Tiotropium Bromide-Olodaterol (STIOLTO RESPIMAT) 2.5-2.5 MCG/ACT AERS Inhale into the lungs. 09/10/17   [provider]  topiramate (TOPAMAX) 50 MG tablet Take 50 mg by mouth 2 (two) times daily. 06/05/16   [provider]  torsemide (DEMADEX) 20 MG tablet Take by mouth. 07/05/17 07/05/18  [provider]  warfarin (COUMADIN) 5 MG tablet Take 5-7.5 mg by mouth See admin instructions. Take 7.5 mg  on Monday, Wednesday and Friday then take 5 mg all the other days 07/17/16   [provider]    Physical Exam: Vitals:   03/18/19 0610 03/18/19 0838 03/18/19 0853 03/18/19 0918  BP: (!) 115/59 (!) 144/64 132/65 (!) 155/82  Pulse: 87 (!) 104 (!) 102 98  Resp: 16 15 15 17   Temp: 98.1 F (36.7 C) 98.9 F (37.2 C) 98.9 F (37.2 C) 98.5 F (36.9 C)  TempSrc: Oral   Oral  SpO2: 96% 100% 99% 95%  Weight:      Height:          Constitutional: NAD, calm, comfortable, obese male ENMT: Mucous membranes are moist.  Neck: normal, supple, no masses Respiratory:  Normal respiratory effort. No accessory muscle use.  Cardiovascular: Regular rate and rhythm, + LE edema Abdomen: no tenderness, no masses palpated. Bowel sounds positive.  Musculoskeletal: no clubbing / cyanosis. Normal muscle tone.  Skin: Left leg postoperative bandage and wound VAC intact without leak, draining dark sanguinous fluid. Neurologic: moves all 4 ext without deficit, left lower extremity movement limited by pain  Labs on Admission: I have personally reviewed following labs and imaging studies  CBC: Recent Labs  Lab 03/13/19 1710 03/15/19 0451 03/16/19 0316 03/17/19 0213 03/18/19 0208  WBC 10.1 9.6 11.4* 11.7* 10.6*  NEUTROABS 6.1  --   --   --   --   HGB 13.0 13.0 11.6* 11.2* 11.3*  HCT 39.6 39.7 36.7* 35.0* 35.0*  MCV 94.5 95.4 96.6 95.1 93.6  PLT 353 330 323 299 310   Basic Metabolic Panel: Recent Labs  Lab 03/13/19 1710 03/15/19 0451 03/16/19 0316 03/17/19 0213 03/18/19 0208  NA 140 140 138 139 140  K 3.8 3.4* 3.8 3.9 3.7  CL 104 101 102 101 101  CO2 24 28 28 29 27   GLUCOSE 113* 122* 113* 112* 115*  BUN 15 9 10 12 19   CREATININE 0.86 0.75 0.80 0.77 0.90  CALCIUM 9.2 8.7* 8.3* 8.6* 8.4*   GFR: Estimated Creatinine Clearance: 99.2 mL/min (by C-G formula based on SCr of 0.9 mg/dL). Liver Function Tests: Recent Labs  Lab 03/13/19 1710 03/16/19 0316 03/17/19 0213 03/18/19 0208    AST 26 23 16 19   ALT 24 22 19 21   ALKPHOS 52 49 48 45  BILITOT 0.2* 0.6 0.6 0.5  PROT 6.7 6.1* 6.4* 6.4*  ALBUMIN 3.2* 2.9* 2.8* 2.8*   No results for input(s): LIPASE, AMYLASE in the last 168 hours. No results for input(s): AMMONIA in the last 168 hours. Coagulation Profile: Recent Labs  Lab 03/13/19 1710 03/14/19 1636 03/16/19 1856 03/17/19  8341 03/18/19 0208  INR 2.0* 1.5* 1.1 1.1 1.2   Cardiac Enzymes: No results for input(s): CKTOTAL, CKMB, CKMBINDEX, TROPONINI in the last 168 hours. BNP (last 3 results) No results for input(s): PROBNP in the last 8760 hours. HbA1C: No results for input(s): HGBA1C in the last 72 hours. CBG: Recent Labs  Lab 03/17/19 1649 03/17/19 2054 03/18/19 0553 03/18/19 0845 03/18/19 1142  GLUCAP 161* 163* 194* 111* 181*   Lipid Profile: No results for input(s): CHOL, HDL, LDLCALC, TRIG, CHOLHDL, LDLDIRECT in the last 72 hours. Thyroid Function Tests: No results for input(s): TSH, T4TOTAL, FREET4, T3FREE, THYROIDAB in the last 72 hours. Anemia Panel: No results for input(s): VITAMINB12, FOLATE, FERRITIN, TIBC, IRON, RETICCTPCT in the last 72 hours. Urine analysis: No results found for: COLORURINE, APPEARANCEUR, LABSPEC, PHURINE, GLUCOSEU, HGBUR, BILIRUBINUR, KETONESUR, PROTEINUR, UROBILINOGEN, NITRITE, LEUKOCYTESUR   Radiological Exams on Admission: VAS [place] ABI WITH/WO TBI  Result Date: 03/16/2019 LOWER EXTREMITY DOPPLER STUDY Indications: Ulceration. High Risk Factors: Hypertension, coronary artery disease.  Limitations: Today's exam was limited due to bandages. Comparison Study: no prior Performing Technologist: Blanch Media RVS  Examination Guidelines: A complete evaluation includes at minimum, Doppler waveform signals and systolic blood pressure reading at the level of bilateral brachial, anterior tibial, and posterior tibial arteries, when vessel segments are accessible. Bilateral testing is considered an integral part of a complete  examination. Photoelectric Plethysmograph (PPG) waveforms and toe systolic pressure readings are included as required and additional duplex testing as needed. Limited examinations for reoccurring indications may be performed as noted.  ABI Findings: +--------+------------------+-----+---------+--------+ Right   Rt Pressure (mmHg)IndexWaveform Comment  +--------+------------------+-----+---------+--------+ [id number]                    triphasic         +--------+------------------+-----+---------+--------+ PTA     104               0.74 biphasic          +--------+------------------+-----+---------+--------+ DP      79                0.56 biphasic          +--------+------------------+-----+---------+--------+ +--------+------------------+-----+---------+--------------------------------+ Left    Lt Pressure (mmHg)IndexWaveform Comment                          +--------+------------------+-----+---------+--------------------------------+ [id number]                    triphasic                                 +--------+------------------+-----+---------+--------------------------------+ PTA                                     unable to obtain due to bandages +--------+------------------+-----+---------+--------------------------------+ DP      123               0.87 biphasic                                  +--------+------------------+-----+---------+--------------------------------+ +-------+-----------+-----------+------------+------------+ ABI/TBIToday's ABIToday's TBIPrevious ABIPrevious TBI +-------+-----------+-----------+------------+------------+ Right  0.74                                           +-------+-----------+-----------+------------+------------+  Left   0.87                                           +-------+-----------+-----------+------------+------------+  Summary: Right: Resting right ankle-brachial index indicates moderate  right lower extremity arterial disease. Left: Resting left ankle-brachial index indicates mild left lower extremity arterial disease.  *See table(s) above for measurements and observations.  Electronically signed by Waverly Ferrari MD on 03/16/2019 at 3:08:50 PM.   Final      Azucena Fallen DO [facility]   How to contact the [facility] Attending or Consulting provider 7A - 7P or covering provider during after hours 7P -7A, for this patient?  1. Check the care team in [facility] and look for a) attending/consulting [facility] provider listed and b) the [facility] team listed 2. Log into [email or web address] and use [place]'s universal password to access. If you do not have the password, please contact the hospital operator. 3. Locate the [facility] provider you are looking for under [facility] and page to a number that you can be directly reached. 4. If you still have difficulty reaching the provider, please page the [facility] (Director on Call) for the Hospitalists listed on amion for assistance.  03/18/2019, 1:41 PM

## 2019-03-18 NOTE — OR Nursing (Signed)
Dr. Lajoyce Corners requested patient to be sent with Prevena Plus 125 Therapy unit post-operatively.  Patient sticker placed on box and sent with patient to PACU.

## 2019-03-18 NOTE — Progress Notes (Signed)
ANTICOAGULATION CONSULT NOTE - Follow Up Consult  Pharmacy Consult for Heparin Indication: Antiphospholipid Syndrome  Allergies  Allergen Reactions  . Melatonin Anxiety  . Ramipril     cough    Patient Measurements: Height: 5\' 8"  (172.7 cm) Weight: 239 lb 13.8 oz (108.8 kg) IBW/kg (Calculated) : 68.4 Heparin Dosing Weight: 92.5  Vital Signs: Temp: 98.5 F (36.9 C) (01/02 0918) Temp Source: Oral (01/02 0918) BP: 155/82 (01/02 0918) Pulse Rate: 98 (01/02 0918)  Labs: Recent Labs    03/16/19 0316 03/16/19 1856 03/17/19 0213 03/17/19 1316 03/17/19 1910 03/18/19 0208  HGB 11.6*  --  11.2*  --   --  11.3*  HCT 36.7*  --  35.0*  --   --  35.0*  PLT 323  --  299  --   --  310  LABPROT  --  14.5 14.1  --   --  14.6  INR  --  1.1 1.1  --   --  1.2  HEPARINUNFRC  --  0.22* 0.25* 0.63 0.52 0.47  CREATININE 0.80  --  0.77  --   --  0.90    Estimated Creatinine Clearance: 99.2 mL/min (by C-G formula based on SCr of 0.9 mg/dL).   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 Heparin was started for bridging while warfarin on hold and INR<2.   The patient is s/p I&D on 12/30 and underwent repeat I&D this AM. Heparin level this AM was therapeutic at 0.47 at 2150 units/hr, and the infusion was stopped at 0300 pre-op. CBC remains stable.  Goal of Therapy:  Heparin level 0.3-0.7 units/ml Monitor platelets by anticoagulation protocol: Yes   Plan:  Spoke to Dr. 1/31, can resume heparin infusion at 2150 units/hr now, plan to restart warfarin on 1/3, no more procedures planned Heparin level in 6 hours Monitor for bleeding closely Daily HL, INR, CBC   2151, PharmD PGY1 Pharmacy Resident  Please check AMION for all [facility] Pharmacy phone numbers After 10:00 PM, call Main Pharmacy [phone] 03/18/2019 10:43 AM

## 2019-03-18 NOTE — Social Work (Signed)
CSW acknowledging consult for HH/DDME. Will follow for therapy recommendations needed to best determine disposition/for insurance authorization.   Octavio Graves, MSW, LCSWA [facility]

## 2019-03-18 NOTE — Procedures (Signed)
Patient declined the CPAP for tonight.   

## 2019-03-18 NOTE — Op Note (Signed)
03/18/2019  8:45 AM  PATIENT:  Jose [name]    PRE-OPERATIVE DIAGNOSIS:  abscess left thigh  POST-OPERATIVE DIAGNOSIS:  Same  PROCEDURE:  LEFT THIGH DEBRIDEMENT With excision skin soft tissue muscle and fascia. Application Of Wound Vac Local tissue rearrangement for wound closure 30 x 10 cm.  SURGEON:  Nadara Mustard, MD  PHYSICIAN ASSISTANT:None ANESTHESIA:   General  PREOPERATIVE INDICATIONS:  Jose [name] is a  64 y.o. male with a diagnosis of abscess left thigh who failed conservative measures and elected for surgical management.    The risks benefits and alternatives were discussed with the patient preoperatively including but not limited to the risks of infection, bleeding, nerve injury, cardiopulmonary complications, the need for revision surgery, among others, and the patient was willing to proceed.  OPERATIVE IMPLANTS: Praveena customizable wound VAC  @ENCIMAGES @  OPERATIVE FINDINGS: Healthy granulation tissue no necrotic tissue no signs of abscess cultures were not obtained.  OPERATIVE PROCEDURE: Patient brought the operating room and underwent a general anesthetic.  After adequate levels anesthesia were obtained patient's left lower extremity was prepped using DuraPrep draped into a sterile field a timeout was called.  There was some mild ischemic changes along the surgical incision.  1 cm of the skin edges was excised sharply with a 21 blade knife back to bleeding viable healthy skin edges.  There was further excision of the soft tissue muscle and fascia with a 21 blade knife and a rondure.  All wound margins were healthy viable the muscle had good color and contractility.  The wound was irrigated with normal saline.  Local tissue rearrangement was used to close the wound 30 x 10 cm with 2-0 nylon.  An incisional wound VAC was applied this had a good suction fit the left lower extremity was overwrapped with Coban.  Patient was extubated taken the PACU in stable  condition.   DISCHARGE PLANNING:  Antibiotic duration: Continue antibiotics as per infectious disease  Weightbearing: Weightbearing as tolerated on the left  Pain medication: Opioid pathway  Dressing care/ Wound VAC: Continue wound VAC for 1 week  Ambulatory devices: Walker or crutches  Discharge to: Anticipate discharge to home or skilled nursing if recommended by physical therapy.  Follow-up: In the office 1 week post operative.

## 2019-03-18 NOTE — Interval H&P Note (Signed)
History and Physical Interval Note:  03/18/2019 7:45 AM  Jose [name]  has presented today for surgery, with the diagnosis of abscess left thigh.  The various methods of treatment have been discussed with the patient and family. After consideration of risks, benefits and other options for treatment, the patient has consented to  Procedure(s): LEFT THIGH DEBRIDEMENT (Left) as a surgical intervention.  The patient's history has been reviewed, patient examined, no change in status, stable for surgery.  I have reviewed the patient's chart and labs.  Questions were answered to the patient's satisfaction.     Nadara Mustard

## 2019-03-18 NOTE — Transfer of Care (Signed)
Immediate Anesthesia Transfer of Care Note  Patient: Jose [name]  Procedure(s) Performed: LEFT THIGH DEBRIDEMENT (Left Leg Upper) Application Of Wound Vac (Left Leg Upper)  Patient Location: PACU  Anesthesia Type:General  Level of Consciousness: awake, drowsy and patient cooperative  Airway & Oxygen Therapy: Patient Spontanous Breathing and Patient connected to nasal cannula oxygen  Post-op Assessment: Report given to RN and Post -op Vital signs reviewed and stable  Post vital signs: Reviewed and stable  Last Vitals:  Vitals Value Taken Time  BP 144/64 03/18/19 0838  Temp    Pulse 103 03/18/19 0840  Resp 13 03/18/19 0840  SpO2 100 % 03/18/19 0840  Vitals shown include unvalidated device data.  Last Pain:  Vitals:   03/18/19 0613  TempSrc:   PainSc: 7       Patients Stated Pain Goal: 0 (03/13/19 1701)  Complications: No apparent anesthesia complications

## 2019-03-18 NOTE — Plan of Care (Signed)
  Problem: Education: Goal: Required Educational Video(s) Outcome: Progressing   Problem: Clinical Measurements: Goal: Ability to maintain clinical measurements within normal limits will improve Outcome: Progressing   Problem: Skin Integrity: Goal: Demonstration of wound healing without infection will improve Outcome: Progressing   

## 2019-03-18 NOTE — Anesthesia Preprocedure Evaluation (Signed)
Anesthesia Evaluation  Patient identified by MRN, date of birth, ID band Patient awake    Reviewed: Allergy & Precautions, H&P , NPO status , Patient's Chart, lab work & pertinent test results  Airway Mallampati: II   Neck ROM: full    Dental   Pulmonary sleep apnea , COPD, former smoker,    breath sounds clear to auscultation       Cardiovascular hypertension, + angina + CAD and + Cardiac Stents  + dysrhythmias  Rhythm:regular Rate:Normal  TTE (03/16/19); normal LV function   Neuro/Psych    GI/Hepatic GERD  ,  Endo/Other  diabetes, Type 2  Renal/GU      Musculoskeletal   Abdominal   Peds  Hematology Antiphospholipid syndrome   Anesthesia Other Findings   Reproductive/Obstetrics                             Anesthesia Physical Anesthesia Plan  ASA: III  Anesthesia Plan: General   Post-op Pain Management:    Induction: Intravenous  PONV Risk Score and Plan: 2 and Ondansetron, Dexamethasone, Midazolam and Treatment may vary due to age or medical condition  Airway Management Planned: LMA  Additional Equipment:   Intra-op Plan:   Post-operative Plan: Extubation in OR  Informed Consent: I have reviewed the patients History and Physical, chart, labs and discussed the procedure including the risks, benefits and alternatives for the proposed anesthesia with the patient or authorized representative who has indicated his/her understanding and acceptance.       Plan Discussed with: CRNA, Anesthesiologist and Surgeon  Anesthesia Plan Comments:         Anesthesia Quick Evaluation

## 2019-03-18 NOTE — Progress Notes (Signed)
ANTICOAGULATION CONSULT NOTE - Follow Up Consult  Pharmacy Consult for Heparin Indication: Antiphospholipid Syndrome  Allergies  Allergen Reactions  . Melatonin Anxiety  . Ramipril     cough    Patient Measurements: Height: 5\' 8"  (172.7 cm) Weight: 239 lb 13.8 oz (108.8 kg) IBW/kg (Calculated) : 68.4 Heparin Dosing Weight: 92.5  Vital Signs: Temp: 98.4 F (36.9 C) (01/02 1805) Temp Source: Oral (01/02 1805) BP: 105/61 (01/02 1805) Pulse Rate: 89 (01/02 1805)  Labs: Recent Labs    03/16/19 0316 03/16/19 1856 03/17/19 0213 03/17/19 1910 03/18/19 0208 03/18/19 1802  HGB 11.6*  --  11.2*  --  11.3*  --   HCT 36.7*  --  35.0*  --  35.0*  --   PLT 323  --  299  --  310  --   LABPROT  --  14.5 14.1  --  14.6  --   INR  --  1.1 1.1  --  1.2  --   HEPARINUNFRC  --  0.22* 0.25* 0.52 0.47 0.40  CREATININE 0.80  --  0.77  --  0.90  --     Estimated Creatinine Clearance: 99.2 mL/min (by C-G formula based on SCr of 0.9 mg/dL).   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 Heparin was started for bridging while warfarin on hold and INR<2.   The patient is s/p I&D on 12/30 and underwent repeat I&D this AM and heparin was restarted -heparin level= 0.4  Goal of Therapy:  Heparin level 0.3-0.7 units/ml Monitor platelets by anticoagulation protocol: Yes   Plan:  -no heparin changes needed -Daily heparin level and CBC  1/31, PharmD Clinical Pharmacist **Pharmacist phone directory can now be found on [email or web address] ([email or web address]).  Listed under [facility].

## 2019-03-18 NOTE — Evaluation (Signed)
Physical Therapy Evaluation Patient Details Name: Jose [name] MRN: [id number] DOB: Jul 01, 1955 Today's Date: 03/18/2019   History of Present Illness  Pt is a 64 y/o male s/p multiple L thigh I&D with wound VAC placement. PMH including but not limited to antiphospholipid antibody syndrome on Coumadin with an INR goal of 2.5-3.5, COPD, DM, HTN, CAD s/p CABG and a chronic leg wound.    Clinical Impression  Pt presented supine in bed with HOB elevated, awake and willing to participate in therapy session. Prior to admission, pt reported that he was independent with all functional mobility and ADLs. Pt lives with his spouse in a single level mobile home with several steps to enter. At the time of evaluation, pt greatly limited secondary to significant L thigh and groin pain. However, he was able to perform bed mobility with min guard and transfers with min A with RW. Pt would continue to benefit from skilled physical therapy services at this time while admitted and after d/c to address the below listed limitations in order to improve overall safety and independence with functional mobility.     Follow Up Recommendations Home health PT;Supervision/Assistance - 24 hour    Equipment Recommendations  Rolling walker with 5" wheels;3in1 (PT)    Recommendations for Other Services       Precautions / Restrictions Precautions Precautions: Fall Precaution Comments: wound VAC L thigh Restrictions Weight Bearing Restrictions: Yes LLE Weight Bearing: Weight bearing as tolerated      Mobility  Bed Mobility Overal bed mobility: Needs Assistance Bed Mobility: Supine to Sit     Supine to sit: Min guard     General bed mobility comments: significantly increased time needed, HOB elevated, use of bed rails, min guard for safety  Transfers Overall transfer level: Needs assistance Equipment used: Rolling walker (2 wheeled) Transfers: Sit to/from [employer] Sit to Stand: Min  guard Stand pivot transfers: Min assist       General transfer comment: cueing for safe hand placement, min A for RW management with pivot to recliner chair towards pt's R side. Pt able to accept partial weight through L LE  Ambulation/Gait                Stairs            Wheelchair Mobility    Modified Rankin (Stroke Patients Only)       Balance Overall balance assessment: Needs assistance Sitting-balance support: Feet supported Sitting balance-[name] Scale: Good     Standing balance support: Bilateral upper extremity supported;Single extremity supported Standing balance-[name] Scale: Poor                               Pertinent Vitals/Pain Pain Assessment: 0-10 Pain Score: 8  Pain Location: lower back and L thigh Pain Descriptors / Indicators: Aching Pain Intervention(s): Monitored during session;Repositioned    Home Living Family/patient expects to be discharged to:: Private residence Living Arrangements: Spouse/significant other Available Help at Discharge: Family;Available PRN/intermittently Type of Home: Mobile home Home Access: Stairs to enter Entrance Stairs-Rails: Right Entrance Stairs-Number of Steps: 5 Home Layout: One level Home Equipment: Emergency planning/management officer - 2 wheels      Prior Function Level of Independence: Independent         Comments: drives, 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        Extremity/Trunk Assessment   Upper Extremity Assessment Upper Extremity  Assessment: Overall WFL for tasks assessed    Lower Extremity Assessment Lower Extremity Assessment: LLE deficits/detail LLE Deficits / Details: pt with decreased strength and ROM limitations secondary to post-op pain and weakness LLE: Unable to fully assess due to pain       Communication   Communication: No difficulties  Cognition Arousal/Alertness: Awake/alert Behavior During Therapy: WFL for tasks  assessed/performed Overall Cognitive Status: Within Functional Limits for tasks assessed                                        General Comments      Exercises     Assessment/Plan    PT Assessment Patient needs continued PT services  PT Problem List Decreased strength;Decreased range of motion;Decreased balance;Decreased mobility;Decreased coordination;Decreased activity tolerance;Decreased knowledge of use of DME;Decreased safety awareness;Decreased knowledge of precautions;Pain       PT Treatment Interventions DME instruction;Gait training;Stair training;Functional mobility training;Therapeutic activities;Therapeutic exercise;Neuromuscular re-education;Balance training;Patient/family education    PT Goals (Current goals can be found in the Care Plan section)  Acute Rehab PT Goals Patient Stated Goal: decrease pain PT Goal Formulation: With patient Time For Goal Achievement: 04/01/19 Potential to Achieve Goals: Good    Frequency Min 3X/week   Barriers to discharge        Co-evaluation               AM-PAC PT "6 Clicks" Mobility  Outcome Measure Help needed turning from your back to your side while in a flat bed without using bedrails?: None Help needed moving from lying on your back to sitting on the side of a flat bed without using bedrails?: None Help needed moving to and from a bed to a chair (including a wheelchair)?: A Little Help needed standing up from a chair using your arms (e.g., wheelchair or bedside chair)?: A Little Help needed to walk in hospital room?: A Little Help needed climbing 3-5 steps with a railing? : A Lot 6 Click Score: 19    End of Session   Activity Tolerance: Patient limited by pain Patient left: in chair;with call bell/phone within reach Nurse Communication: Mobility status PT Visit Diagnosis: Other abnormalities of gait and mobility (R26.89);Pain Pain - Right/Left: Left Pain - part of body: Leg    Time:  3546-5681 PT Time Calculation (min) (ACUTE ONLY): 66 min   Charges:   PT Evaluation $PT Eval Moderate Complexity: 1 Mod PT Treatments $Therapeutic Activity: 38-52 mins        Anastasio Champion, DPT  Acute Rehabilitation Services Pager [phone] Office [place] 03/18/2019, 4:37 PM

## 2019-03-19 ENCOUNTER — Inpatient Hospital Stay: Payer: Self-pay

## 2019-03-19 DIAGNOSIS — B964 Proteus (mirabilis) (morganii) as the cause of diseases classified elsewhere: Secondary | ICD-10-CM

## 2019-03-19 LAB — CBC
HCT: 31.7 % — ABNORMAL LOW (ref 39.0–52.0)
Hemoglobin: 10.5 g/dL — ABNORMAL LOW (ref 13.0–17.0)
MCH: 30.6 pg (ref 26.0–34.0)
MCHC: 33.1 g/dL (ref 30.0–36.0)
MCV: 92.4 fL (ref 80.0–100.0)
Platelets: 332 10*3/uL (ref 150–400)
RBC: 3.43 MIL/uL — ABNORMAL LOW (ref 4.22–5.81)
RDW: 14.1 % (ref 11.5–15.5)
WBC: 13.5 10*3/uL — ABNORMAL HIGH (ref 4.0–10.5)
nRBC: 0 % (ref 0.0–0.2)

## 2019-03-19 LAB — COMPREHENSIVE METABOLIC PANEL
ALT: 28 U/L (ref 0–44)
AST: 22 U/L (ref 15–41)
Albumin: 2.9 g/dL — ABNORMAL LOW (ref 3.5–5.0)
Alkaline Phosphatase: 45 U/L (ref 38–126)
Anion gap: 10 (ref 5–15)
BUN: 21 mg/dL (ref 8–23)
CO2: 29 mmol/L (ref 22–32)
Calcium: 8.4 mg/dL — ABNORMAL LOW (ref 8.9–10.3)
Chloride: 99 mmol/L (ref 98–111)
Creatinine, Ser: 0.98 mg/dL (ref 0.61–1.24)
GFR calc Af Amer: >60 mL/min (ref >60)
GFR calc non Af Amer: >60 mL/min (ref >60)
Glucose, Bld: 137 mg/dL — ABNORMAL HIGH (ref 70–99)
Potassium: 3.7 mmol/L (ref 3.5–5.1)
Sodium: 138 mmol/L (ref 135–145)
Total Bilirubin: 0.5 mg/dL (ref 0.3–1.2)
Total Protein: 6.6 g/dL (ref 6.5–8.1)

## 2019-03-19 LAB — PROTIME-INR
INR: 1.1 (ref 0.8–1.2)
Prothrombin Time: 13.6 seconds (ref 11.4–15.2)

## 2019-03-19 LAB — GLUCOSE, CAPILLARY
Glucose-Capillary: 128 mg/dL — ABNORMAL HIGH (ref 70–99)
Glucose-Capillary: 145 mg/dL — ABNORMAL HIGH (ref 70–99)
Glucose-Capillary: 167 mg/dL — ABNORMAL HIGH (ref 70–99)
Glucose-Capillary: 132 mg/dL — ABNORMAL HIGH (ref 70–99)

## 2019-03-19 LAB — HEPARIN LEVEL (UNFRACTIONATED): Heparin Unfractionated: 0.47 IU/mL (ref 0.30–0.70)

## 2019-03-19 MED ORDER — ROSUVASTATIN CALCIUM 5 MG PO TABS
10.0000 mg | ORAL_TABLET | Freq: Every evening | ORAL | Status: DC
  Administered 2019-03-19 – 2019-03-22 (×4): 10 mg via ORAL
  Filled 2019-03-19 (×4): qty 2

## 2019-03-19 MED ORDER — WARFARIN - PHARMACIST DOSING INPATIENT: Freq: Every day | Status: DC

## 2019-03-19 MED ORDER — SULFAMETHOXAZOLE-TRIMETHOPRIM 800-160 MG PO TABS
1.0000 | ORAL_TABLET | Freq: Two times a day (BID) | ORAL | Status: DC
  Administered 2019-03-19 – 2019-03-21 (×6): 1 via ORAL
  Filled 2019-03-19 (×6): qty 1

## 2019-03-19 MED ORDER — GABAPENTIN 300 MG PO CAPS
300.0000 mg | ORAL_CAPSULE | Freq: Three times a day (TID) | ORAL | Status: DC
  Administered 2019-03-19 – 2019-03-20 (×4): 300 mg via ORAL
  Filled 2019-03-19 (×4): qty 1

## 2019-03-19 MED ORDER — TAMSULOSIN HCL 0.4 MG PO CAPS
0.8000 mg | ORAL_CAPSULE | Freq: Every day | ORAL | Status: DC
  Administered 2019-03-19 – 2019-03-22 (×4): 0.8 mg via ORAL
  Filled 2019-03-19 (×4): qty 2

## 2019-03-19 MED ORDER — TIZANIDINE HCL 2 MG PO TABS
4.0000 mg | ORAL_TABLET | Freq: Three times a day (TID) | ORAL | Status: DC
  Administered 2019-03-19 – 2019-03-22 (×11): 4 mg via ORAL
  Filled 2019-03-19 (×11): qty 2

## 2019-03-19 MED ORDER — ALBUTEROL SULFATE (2.5 MG/3ML) 0.083% IN NEBU: 2.5000 mg | INHALATION_SOLUTION | Freq: Four times a day (QID) | RESPIRATORY_TRACT | Status: DC | PRN

## 2019-03-19 MED ORDER — WARFARIN SODIUM 5 MG PO TABS
5.0000 mg | ORAL_TABLET | Freq: Once | ORAL | Status: AC
  Administered 2019-03-19: 17:00:00 5 mg via ORAL
  Filled 2019-03-19: qty 1

## 2019-03-19 MED ORDER — WARFARIN SODIUM 7.5 MG PO TABS
7.5000 mg | ORAL_TABLET | Freq: Once | ORAL | Status: DC
  Filled 2019-03-19: qty 1

## 2019-03-19 NOTE — Progress Notes (Signed)
[facility]   Reason for visit: Follow up on abscess  Interval History: new culture with Proteus, pansensitive.  Poor IV access.  No fever, no chills.  No associated rash or diarrhea.    Physical Exam: Constitutional:  Vitals:   03/19/19 0251 03/19/19 0627  BP: 107/64 108/60  Pulse: 80 77  Resp: 16 17  Temp: 98.7 F (37.1 C) 98.6 F (37 C)  SpO2: 94% 96%   patient appears in NAD MS: vac in place Skin: no rash  Review of Systems: Constitutional: negative for fevers and chills Gastrointestinal: negative for nausea and diarrhea Integument/breast: negative for rash  Lab Results  Component Value Date   WBC 13.5 (H) 03/19/2019   HGB 10.5 (L) 03/19/2019   HCT 31.7 (L) 03/19/2019   MCV 92.4 03/19/2019   PLT 332 03/19/2019    Lab Results  Component Value Date   CREATININE 0.98 03/19/2019   BUN 21 03/19/2019   NA 138 03/19/2019   K 3.7 03/19/2019   CL 99 03/19/2019   CO2 29 03/19/2019    Lab Results  Component Value Date   ALT 28 03/19/2019   AST 22 03/19/2019   ALKPHOS 45 03/19/2019     Microbiology: Recent Results (from the past 240 hour(s))  Culture, blood (Routine x 2)     Status: None   Collection Time: 03/13/19  5:10 PM   Specimen: BLOOD  Result Value Ref Range Status   Specimen Description BLOOD RIGHT ANTECUBITAL  Final   Special Requests   Final    BOTTLES DRAWN AEROBIC AND ANAEROBIC Blood Culture adequate volume   Culture   Final    NO GROWTH 5 DAYS Performed at [facility], [address], [place], [place] [address]    Report Status 03/18/2019 FINAL  Final  Culture, blood (Routine x 2)     Status: None   Collection Time: 03/13/19  5:15 PM   Specimen: BLOOD  Result Value Ref Range Status   Specimen Description BLOOD LEFT ANTECUBITAL  Final   Special Requests   Final    BOTTLES DRAWN AEROBIC AND ANAEROBIC Blood Culture adequate volume   Culture   Final    NO GROWTH 5 DAYS Performed at [facility]  Lab, [address] [place], [place] [address]    Report Status 03/18/2019 FINAL  Final  SARS CORONAVIRUS 2 (TAT 6-24 HRS) Nasopharyngeal Nasopharyngeal Swab     Status: None   Collection Time: 03/14/19  9:45 PM   Specimen: Nasopharyngeal Swab  Result Value Ref Range Status   SARS Coronavirus 2 NEGATIVE NEGATIVE Final    Comment: (NOTE) SARS-CoV-2 target nucleic acids are NOT DETECTED. The SARS-CoV-2 RNA is generally detectable in upper and lower respiratory specimens during the acute phase of infection. Negative results do not preclude SARS-CoV-2 infection, do not rule out co-infections with other pathogens, and should not be used as the sole basis for treatment or other patient management decisions. Negative results must be combined with clinical observations, patient history, and epidemiological information. The expected result is Negative. Fact Sheet for Patients: HairSlick.no Fact Sheet for Healthcare Providers: quierodirigir.com This test is not yet approved or cleared by the [place] FDA and  has been authorized for detection and/or diagnosis of SARS-CoV-2 by FDA under an Emergency Use Authorization (EUA). This EUA will remain  in effect (meaning this test can be used) for the duration of the COVID-19 declaration under Section 56 4(b)(1) of the  Act, 21 U.S.C. section 360bbb-3(b)(1), unless the authorization is terminated or revoked sooner. Performed at [facility], [place] [address], [place], [place] [address]   Surgical pcr screen     Status: None   Collection Time: 03/15/19 10:38 AM   Specimen: Nasal Mucosa; Nasal Swab  Result Value Ref Range Status   MRSA, PCR NEGATIVE NEGATIVE Final   Staphylococcus aureus NEGATIVE NEGATIVE Final    Comment: (NOTE) The Xpert SA Assay (FDA approved for NASAL specimens in patients 59 years of age and older), is one component of a comprehensive surveillance program. It is not  intended to diagnose infection nor to guide or monitor treatment. Performed at [facility], [place] [address] [place], [place] [address]   Aerobic/Anaerobic Culture (surgical/deep wound)     Status: None (Preliminary result)   Collection Time: 03/15/19  4:52 PM   Specimen: Soft Tissue, Other  Result Value Ref Range Status   Specimen Description LEFT THIGH  Final   Special Requests NONE  Final   Gram Stain   Final    ABUNDANT WBC PRESENT, PREDOMINANTLY PMN FEW GRAM POSITIVE COCCI RARE GRAM POSITIVE RODS    Culture   Final    RARE PROTEUS MIRABILIS MULTIPLE ORGANISMS PRESENT, NONE PREDOMINANT NO ANAEROBES ISOLATED; CULTURE IN PROGRESS FOR 5 DAYS    Report Status PENDING  Incomplete   Organism ID, Bacteria PROTEUS MIRABILIS  Final      Susceptibility   Proteus mirabilis - MIC*    AMPICILLIN <=2 SENSITIVE Sensitive     CEFAZOLIN <=4 SENSITIVE Sensitive     CEFEPIME <=0.12 SENSITIVE Sensitive     CEFTAZIDIME <=1 SENSITIVE Sensitive     CEFTRIAXONE <=0.25 SENSITIVE Sensitive     CIPROFLOXACIN <=0.25 SENSITIVE Sensitive     GENTAMICIN <=1 SENSITIVE Sensitive     IMIPENEM 1 SENSITIVE Sensitive     TRIMETH/SULFA <=20 SENSITIVE Sensitive     AMPICILLIN/SULBACTAM <=2 SENSITIVE Sensitive     PIP/TAZO Value in next row Sensitive      <=4 SENSITIVEPerformed at [facility], [address], [place], [place] [address]    * RARE PROTEUS MIRABILIS    Impression/Plan:  1. Abscess - Proteus and previously with Staph lugdunensis.  Both Bactrim sensitive. Will use Bactrim DS 1 tab twice a day for 10 days. Follow up with Dr. Sharol Given for VAC/wound care.  2.  Access - stopping IV antibiotics and I have let the nurse know we do not need a picc line at this time.   I will sign off, call with any questions.

## 2019-03-19 NOTE — Progress Notes (Signed)
Pt refusing use of CPAP.  RT will continue to monitor.  

## 2019-03-19 NOTE — Progress Notes (Addendum)
ANTICOAGULATION CONSULT NOTE - Follow Up Consult  Pharmacy Consult for Heparin Indication: Antiphospholipid Syndrome  Allergies  Allergen Reactions  . Melatonin Anxiety  . Ramipril     cough    Patient Measurements: Height: 5\' 8"  (172.7 cm) Weight: 239 lb 13.8 oz (108.8 kg) IBW/kg (Calculated) : 68.4 Heparin Dosing Weight: 92.5  Vital Signs: Temp: 98.6 F (37 C) (01/03 0627) Temp Source: Oral (01/03 0627) BP: 108/60 (01/03 0627) Pulse Rate: 77 (01/03 0627)  Labs: Recent Labs    03/17/19 0213 03/18/19 0208 03/18/19 1802 03/19/19 0155  HGB 11.2* 11.3*  --  10.5*  HCT 35.0* 35.0*  --  31.7*  PLT 299 310  --  332  LABPROT 14.1 14.6  --  13.6  INR 1.1 1.2  --  1.1  HEPARINUNFRC 0.25* 0.47 0.40 0.47  CREATININE 0.77 0.90  --  0.98    Estimated Creatinine Clearance: 91.1 mL/min (by C-G formula based on SCr of 0.98 mg/dL).   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 Heparin was started for bridging while warfarin on hold and INR<2. The patient is s/p I&D on 12/30 and 1/2.  Heparin level this AM is therapeutic at 0.47. Hgb dropped slightly to 10.5, plt wnl. No bleeding noted.  Goal of Therapy:  Heparin level 0.3-0.7 units/ml Monitor platelets by anticoagulation protocol: Yes   Plan:  Continue heparin infusion 2150 units/hr Daily heparin level and CBC F/u plans to resume warfarin today if no bleeding issues   Addendum: Pharmacy consulted to resume warfarin. Pt's INR on admission was 2.0, which was subtherapeutic according to pt's preferred INR goal of 2.5-3.5. INR today is 1.1 as expected since no warfarin was administered since 12/27.  Pt to start Bactrim DS today for 10 days per ID, will elevate INR.  PTA dose: 5 mg daily  Plan: Warfarin 5 mg PO today Daily INR, s/sx of bleeding   1/28, PharmD PGY1  Pharmacy Resident  Please check AMION for all [facility] phone numbers After 10:00 PM, call Main Pharmacy [phone] 03/19/2019 2:06 PM

## 2019-03-19 NOTE — Progress Notes (Signed)
Progress Note   [name] ONG:[id number] DOB: [birth year] DOA: 03/13/2019  I have briefly reviewed the patient's prior medical records in [place]  PCP: Elenore Paddy, MD  Patient coming from: home  Chief Complaint: wound with discharge  HPI: Jose [name] is a 64 y.o. male with medical history significant of antiphospholipid antibody syndrome on Coumadin with an INR goal of 2.5-3.5, COPD, type 2 diabetes not on insulin, sleep apnea, hypertension and a chronic leg wound.  Per patient he has had this leg wound for the last 2 years but over the last week he has had increasing pain, redness, and copious discharge.  He has seen Dr. Dellia Nims at a wound care center and had his wound culture which grew staph Lugdunensis.  His wound care physician called him in a course of doxycycline but patient has not yet started because he is concerned that he needs IV antibiotics and his inability to manage the drainage.  He denies fever.  In the ER a CT scan of his thigh was obtained that shows a thick walled abscess in the medial distal thigh communicating with an open wound on the skin with surrounding cellulitis.  Patient was initially seen by the general surgery team in the ER who recommended an orthopedic surgery consult given location and proximity to the knee joint.  Patient was seen by orthopedics in the ER and initial recommendations are for ABIs.  If his ABIs are within normal limits the plan is to operate for a washout and wound VAC placement.  Patient's situation is complicated by his need for Coumadin and the need for a heparin bridge.  Subjective: No acute issues or events overnight, looking forward to increasing ambulation, lengthy discussion at bedside about need for antibiotic compliance, wound care compliance and increased mobility as tolerated.  Patient otherwise declines chest pain, shortness of breath, nausea, vomiting, diarrhea, headache, fevers, chills.  Assessment/Plan Active  Problems:   Anti-phospholipid syndrome (HCC)   Essential hypertension, benign   COPD (chronic obstructive pulmonary disease) (HCC)   Sleep apnea   CAD S/P multiple PCIs   Chronic anticoagulation-Couamdin   Non-insulin treated type 2 diabetes mellitus (HCC)   Abscess   Abscess of left thigh   Acute on chronic complex leg wound with abscess, POA - Left medial thigh, does not meet sepsis criteria - CT scan shows a thickened walled abscess with a communicating opening to the skin and cellulitis. - Orthopedic surgery following, appreciate insight recommendations, tolerated debridement on 03/15/19 with wound VAC placement without incident. - Repeat debridement tolerated well 03/18/19 per Ortho - ID on board - recommending echo - unfortunately poorly visualized; will consider TEE after further discussion with ID (patient seems hesitant) -Discontinue vancomycin, cefepime; initiate Bactrim per ID - Previously grew staph lugdunensis at wound care outpatient clinic; preliminary culture here shows Proteus  Morbid obesity - Estimated body mass index is 36.49 kg/m as calculated from the following:      Height as of 12/27/18: 5\' 8"  (1.727 m)      Weight as of this encounter: 108.9 kg   Type II diabetes: not on insulin - Hemoglobin A1c 6.9 - Sliding scale insulin  Antiphospholipid syndrome - On Coumadin with a goal of 2.5-3.5 - Resume heparin gtt per pharmacy - should be able to resume coumadin as early as 03/19/19 per ortho (if no bleeding complications/issues in the next 24h) Lab Results  Component Value Date   INR 1.1 03/19/2019   INR 1.2 03/18/2019  INR 1.1 03/17/2019   Chronic respiratory failure due to COPD, without acute exacerbation -Wears 2L at home around the clock  Hypertension -Resume home medications as tolerated -Currently holding home medications given current blood pressure  DVT prophylaxis: heparin gtt while INR <2.5 - resume coumadin 03/19/19 Code Status: full   Disposition Plan: Pending clinical course and PT OT, likely home with home health per patient's wishes versus SNF Consults called: orthopedics and general surgery    Past Medical History:  Diagnosis Date  . Antiphospholipid antibody syndrome (HCC)   . CAD S/P percutaneous coronary angioplasty '98, '04, Feb 2017   [facility]  . Carotid arterial disease (HCC) 10/2015   moderate bilateral  . COPD (chronic obstructive pulmonary disease) (HCC)   . Coronary artery disease   . Hypertension   . Non-insulin treated type 2 diabetes mellitus (HCC)   . Pulmonary emboli (HCC) 2011  . RBBB   . Sleep apnea     Past Surgical History:  Procedure Laterality Date  . APPENDECTOMY    . BACK SURGERY  '89, '06  . COLONOSCOPY    . CORONARY ANGIOPLASTY WITH STENT PLACEMENT  '98, '04, Feb 2017  . I & D EXTREMITY Left 03/15/2019   Procedure: DEBRIDEMENT LEFT THIGH;  Surgeon: Nadara Mustard, MD;  Location: [facility] OR;  Service: Orthopedics;  Laterality: Left;     reports that he quit smoking about 2 months ago. His smoking use included cigarettes. He smoked 0.50 packs per day. He has never used smokeless tobacco. He reports that he does not drink alcohol or use drugs.  Allergies  Allergen Reactions  . Melatonin Anxiety  . Ramipril     cough    Family History  Problem Relation Age of Onset  . CAD Father 31    Prior to Admission medications   Medication Sig Start Date End Date Taking? Authorizing Provider  albuterol (PROVENTIL) (5 MG/ML) 0.5% nebulizer solution Take 0.5 mLs (2.5 mg total) by nebulization every 6 (six) hours as needed for wheezing or shortness of breath. 12/27/18   Terald Sleeper, MD  ALPRAZolam Prudy Feeler) 0.5 MG tablet TAKE ONE TABLET (0.5 MG TOTAL) BY MOUTH 3 (THREE) TIMES A DAY AS NEEDED FOR SLEEP OR ANXIETY. 09/13/17   [provider]  aspirin EC 81 MG tablet Take 81 mg by mouth daily.    [provider]  Blood Glucose Monitoring Suppl (GLUCOCOM BLOOD GLUCOSE MONITOR)  DEVI 1 each by XX route as directed (For Blood Glucose Monitoring) 08/23/17   [provider]  colchicine 0.6 MG tablet Take 1 tablet (0.6 mg total) by mouth 2 (two) times daily. Patient not taking: Reported on 10/04/2017 02/12/17   Shon Hale, MD  furosemide (LASIX) 40 MG tablet Take 1 tablet (40 mg total) by mouth daily. Patient not taking: Reported on 10/04/2017 02/12/17   Shon Hale, MD  isosorbide mononitrate (IMDUR) 60 MG 24 hr tablet Take 1 tablet (60 mg total) by mouth daily. Patient not taking: Reported on 10/04/2017 02/13/17   Shon Hale, MD  methylPREDNISolone (MEDROL DOSEPAK) 4 MG TBPK tablet Use as directed on package 12/27/18   Terald Sleeper, MD  metoprolol succinate (TOPROL-XL) 100 MG 24 hr tablet Take 100 mg by mouth daily. 08/04/16   [provider]  metoprolol tartrate (LOPRESSOR) 12.5 mg TABS tablet Take by mouth. 08/22/17 08/22/18  [provider]  nitroGLYCERIN (NITROSTAT) 0.4 MG SL tablet Place 0.4 mg under the tongue every 5 (five) minutes as needed for chest  pain.  07/07/16   [provider]  pantoprazole (PROTONIX) 40 MG tablet Take 40 mg by mouth 2 (two) times daily. 06/03/16   [provider]  polyethylene glycol (MIRALAX / GLYCOLAX) packet Take 17 g by mouth daily. Patient not taking: Reported on 10/04/2017 02/12/17   Shon Hale, MD  potassium chloride (MICRO-K) 10 MEQ CR capsule Take by mouth. 08/22/17 08/22/18  [provider]  PROAIR HFA 108 (90 Base) MCG/ACT inhaler Inhale 1-2 puffs into the lungs every 4 (four) hours as needed for wheezing.  07/07/16   [provider]  rosuvastatin (CRESTOR) 10 MG tablet Take 10 mg by mouth every evening. 07/07/16   [provider]  tamsulosin (FLOMAX) 0.4 MG CAPS capsule Take 0.4 mg by mouth daily. 06/05/16   [provider]  Tiotropium Bromide-Olodaterol (STIOLTO RESPIMAT) 2.5-2.5 MCG/ACT AERS Inhale into the lungs. 09/10/17   [provider]  topiramate (TOPAMAX) 50 MG tablet Take 50 mg by mouth 2 (two) times daily. 06/05/16   [provider]  torsemide (DEMADEX) 20 MG tablet Take by mouth. 07/05/17 07/05/18  [provider]  warfarin (COUMADIN) 5 MG tablet Take 5-7.5 mg by mouth See admin instructions. Take 7.5 mg on Monday, Wednesday and Friday then take 5 mg all the other days 07/17/16   [provider]    Physical Exam: Vitals:   03/18/19 1425 03/18/19 1805 03/19/19 0251 03/19/19 0627  BP: 127/67 105/61 107/64 108/60  Pulse: 92 89 80 77  Resp: 20 18 16 17   Temp: 98.2 F (36.8 C) 98.4 F (36.9 C) 98.7 F (37.1 C) 98.6 F (37 C)  TempSrc: Oral Oral Oral Oral  SpO2: 97% 98% 94% 96%  Weight:      Height:          Constitutional: NAD, calm, comfortable, obese male ENMT: Mucous membranes are moist.  Neck: normal, supple, no masses Respiratory:  Normal respiratory effort. No accessory muscle use.  Cardiovascular: Regular rate and rhythm, + LE edema Abdomen: no tenderness, no masses palpated. Bowel sounds positive.  Musculoskeletal: no clubbing / cyanosis. Normal muscle tone.  Skin: Left leg postoperative bandage and wound VAC intact without leak, draining dark sanguinous fluid. Neurologic: moves all 4 ext without deficit, left lower extremity movement limited by pain  Labs on Admission: I have personally reviewed following labs and imaging studies  CBC: Recent Labs  Lab 03/13/19 1710 03/15/19 0451 03/16/19 0316 03/17/19 0213 03/18/19 0208 03/19/19 0155  WBC 10.1 9.6 11.4* 11.7* 10.6* 13.5*  NEUTROABS 6.1  --   --   --   --   --   HGB 13.0 13.0 11.6* 11.2* 11.3* 10.5*  HCT 39.6 39.7 36.7* 35.0* 35.0* 31.7*  MCV 94.5 95.4 96.6 95.1 93.6 92.4  PLT 353 330 323 299 310 332   Basic Metabolic Panel: Recent Labs  Lab 03/15/19 0451 03/16/19 0316 03/17/19 0213 03/18/19 0208 03/19/19 0155  NA 140 138 139 140 138  K 3.4* 3.8 3.9 3.7 3.7  CL 101 102 101 101 99  CO2 28  28 29 27 29   GLUCOSE 122* 113* 112* 115* 137*  BUN 9 10 12 19 21   CREATININE 0.75 0.80 0.77 0.90 0.98  CALCIUM 8.7* 8.3* 8.6* 8.4* 8.4*   GFR: Estimated Creatinine Clearance: 91.1 mL/min (by C-G formula based on SCr of 0.98 mg/dL). Liver Function Tests: Recent Labs  Lab 03/13/19 1710 03/16/19 0316 03/17/19 0213 03/18/19 0208 03/19/19 0155  AST 26 23 16 19  22  ALT 24 22 19 21 28   ALKPHOS 52 49 48 45 45  BILITOT 0.2* 0.6 0.6 0.5 0.5  PROT 6.7 6.1* 6.4* 6.4* 6.6  ALBUMIN 3.2* 2.9* 2.8* 2.8* 2.9*   No results for input(s): LIPASE, AMYLASE in the last 168 hours. No results for input(s): AMMONIA in the last 168 hours. Coagulation Profile: Recent Labs  Lab 03/14/19 1636 03/16/19 1856 03/17/19 0213 03/18/19 0208 03/19/19 0155  INR 1.5* 1.1 1.1 1.2 1.1   CBG: Recent Labs  Lab 03/18/19 0553 03/18/19 0845 03/18/19 1142 03/18/19 1655 03/18/19 2132  GLUCAP 194* 111* 181* 191* 302*   Radiological Exams on Admission: No results found.   05/16/19 DO [facility]   How to contact the [facility] Attending or Consulting provider 7A - 7P or covering provider during after hours 7P -7A, for this patient?  1. Check the care team in [facility] and look for a) attending/consulting [facility] provider listed and b) the [facility] team listed 2. Log into [email or web address] and use [facility] universal password to access. If you do not have the password, please contact the hospital operator. 3. Locate the [facility] provider you are looking for under [facility] and page to a number that you can be directly reached. 4. If you still have difficulty reaching the provider, please page the [facility] (Director on Call) for the Hospitalists listed on amion for assistance.  03/19/2019, 7:33 AM

## 2019-03-19 NOTE — Progress Notes (Signed)
VAST consulted for restart 2nd PIV.Patient receiving Heparin and Vancomycin.Assessed for Midline and made primary RN aware that Midline only good for 5 days for Vancomycin.MD prefers PICC vs.Midline.

## 2019-03-20 LAB — CBC
HCT: 32.4 % — ABNORMAL LOW (ref 39.0–52.0)
Hemoglobin: 10.4 g/dL — ABNORMAL LOW (ref 13.0–17.0)
MCH: 30.7 pg (ref 26.0–34.0)
MCHC: 32.1 g/dL (ref 30.0–36.0)
MCV: 95.6 fL (ref 80.0–100.0)
Platelets: 335 10*3/uL (ref 150–400)
RBC: 3.39 MIL/uL — ABNORMAL LOW (ref 4.22–5.81)
RDW: 14.1 % (ref 11.5–15.5)
WBC: 12.2 10*3/uL — ABNORMAL HIGH (ref 4.0–10.5)
nRBC: 0 % (ref 0.0–0.2)

## 2019-03-20 LAB — COMPREHENSIVE METABOLIC PANEL
ALT: 32 U/L (ref 0–44)
AST: 23 U/L (ref 15–41)
Albumin: 2.8 g/dL — ABNORMAL LOW (ref 3.5–5.0)
Alkaline Phosphatase: 45 U/L (ref 38–126)
Anion gap: 13 (ref 5–15)
BUN: 29 mg/dL — ABNORMAL HIGH (ref 8–23)
CO2: 30 mmol/L (ref 22–32)
Calcium: 8.6 mg/dL — ABNORMAL LOW (ref 8.9–10.3)
Chloride: 96 mmol/L — ABNORMAL LOW (ref 98–111)
Creatinine, Ser: 1.1 mg/dL (ref 0.61–1.24)
GFR calc Af Amer: >60 mL/min (ref >60)
GFR calc non Af Amer: >60 mL/min (ref >60)
Glucose, Bld: 120 mg/dL — ABNORMAL HIGH (ref 70–99)
Potassium: 3.3 mmol/L — ABNORMAL LOW (ref 3.5–5.1)
Sodium: 139 mmol/L (ref 135–145)
Total Bilirubin: 0.3 mg/dL (ref 0.3–1.2)
Total Protein: 6.4 g/dL — ABNORMAL LOW (ref 6.5–8.1)

## 2019-03-20 LAB — AEROBIC/ANAEROBIC CULTURE W GRAM STAIN (SURGICAL/DEEP WOUND)

## 2019-03-20 LAB — GLUCOSE, CAPILLARY
Glucose-Capillary: 139 mg/dL — ABNORMAL HIGH (ref 70–99)
Glucose-Capillary: 108 mg/dL — ABNORMAL HIGH (ref 70–99)
Glucose-Capillary: 130 mg/dL — ABNORMAL HIGH (ref 70–99)
Glucose-Capillary: 145 mg/dL — ABNORMAL HIGH (ref 70–99)

## 2019-03-20 LAB — PROTIME-INR
INR: 1.1 (ref 0.8–1.2)
Prothrombin Time: 13.8 seconds (ref 11.4–15.2)

## 2019-03-20 LAB — HEPARIN LEVEL (UNFRACTIONATED): Heparin Unfractionated: 0.53 IU/mL (ref 0.30–0.70)

## 2019-03-20 MED ORDER — ZOLPIDEM TARTRATE 5 MG PO TABS
5.0000 mg | ORAL_TABLET | Freq: Every evening | ORAL | Status: DC | PRN
  Administered 2019-03-20 – 2019-03-21 (×2): 5 mg via ORAL
  Filled 2019-03-20 (×2): qty 1

## 2019-03-20 MED ORDER — WARFARIN SODIUM 6 MG PO TABS
6.0000 mg | ORAL_TABLET | Freq: Once | ORAL | Status: AC
  Administered 2019-03-20: 6 mg via ORAL
  Filled 2019-03-20: qty 1

## 2019-03-20 MED ORDER — ASPIRIN EC 81 MG PO TBEC
81.0000 mg | DELAYED_RELEASE_TABLET | Freq: Every day | ORAL | Status: DC
  Administered 2019-03-21 – 2019-03-22 (×2): 81 mg via ORAL
  Filled 2019-03-20 (×2): qty 1

## 2019-03-20 MED ORDER — GABAPENTIN 300 MG PO CAPS
600.0000 mg | ORAL_CAPSULE | Freq: Three times a day (TID) | ORAL | Status: DC
  Administered 2019-03-20 – 2019-03-22 (×7): 600 mg via ORAL
  Filled 2019-03-20 (×7): qty 2

## 2019-03-20 MED ORDER — MELATONIN 3 MG PO TABS
9.0000 mg | ORAL_TABLET | Freq: Once | ORAL | Status: DC
  Filled 2019-03-20: qty 3

## 2019-03-20 NOTE — Anesthesia Postprocedure Evaluation (Signed)
Anesthesia Post Note  Patient: Jose [name]  Procedure(s) Performed: LEFT THIGH DEBRIDEMENT (Left Leg Upper) Application Of Wound Vac (Left Leg Upper)     Patient location during evaluation: PACU Anesthesia Type: General Level of consciousness: awake and alert Pain management: pain level controlled Vital Signs Assessment: post-procedure vital signs reviewed and stable Respiratory status: spontaneous breathing, nonlabored ventilation, respiratory function stable and patient connected to nasal cannula oxygen Cardiovascular status: blood pressure returned to baseline and stable Postop Assessment: no apparent nausea or vomiting Anesthetic complications: no    Last Vitals:  Vitals:   03/19/19 2058 03/20/19 0605  BP: (!) 118/57 138/63  Pulse: 75 87  Resp: 17 17  Temp: 36.9 C 36.8 C  SpO2: 93% 92%    Last Pain:  Vitals:   03/20/19 0634  TempSrc:   PainSc: Asleep                 Naylea Wigington S

## 2019-03-20 NOTE — Progress Notes (Signed)
Progress Note   Jose [name] WCB:[id number] DOB: 17-Aug-1955 DOA: 03/13/2019  I have briefly reviewed the patient's prior medical records in [facility]  PCP: [name], MD  Patient coming from: home  Chief Complaint: wound with discharge  HPI: Jose [name] is a 64 y.o. male with medical history significant of antiphospholipid antibody syndrome on Coumadin with an INR goal of 2.5-3.5, COPD, type 2 diabetes not on insulin, sleep apnea, hypertension and a chronic leg wound.  Per patient he has had this leg wound for the last 2 years but over the last week he has had increasing pain, redness, and copious discharge.  He has seen Dr. Leanord Hawking at a wound care center and had his wound culture which grew staph Lugdunensis.  His wound care physician called him in a course of doxycycline but patient has not yet started because he is concerned that he needs IV antibiotics and his inability to manage the drainage.  He denies fever.  In the ER a CT scan of his thigh was obtained that shows a thick walled abscess in the medial distal thigh communicating with an open wound on the skin with surrounding cellulitis.  Patient was initially seen by the general surgery team in the ER who recommended an orthopedic surgery consult given location and proximity to the knee joint.  Patient was seen by orthopedics in the ER and initial recommendations are for ABIs.  If his ABIs are within normal limits the plan is to operate for a washout and wound VAC placement.  Patient's situation is complicated by his need for Coumadin and the need for a heparin bridge.  Subjective: No acute issues or events overnight, patient questioning disposition plan, at this point will need to transition patient back to home Coumadin, as well as p.o. pain control.  PT OT evaluating for possible need for SNF versus home health, patient early certain he can manage at home with his wife.  Denies chest pain, shortness of breath, nausea, vomiting,  diarrhea, constipation, headache, fevers, chills.  Pain ongoing, worse with ambulation and range of motion with left lower extremity.  Assessment/Plan Active Problems:   Anti-phospholipid syndrome (HCC)   Essential hypertension, benign   COPD (chronic obstructive pulmonary disease) (HCC)   Sleep apnea   CAD S/P multiple PCIs   Chronic anticoagulation-Couamdin   Non-insulin treated type 2 diabetes mellitus (HCC)   Abscess   Abscess of left thigh   Acute on chronic complex leg wound with abscess, POA - Left medial thigh, does not meet sepsis criteria - CT scan shows a thickened walled abscess with a communicating opening to the skin and cellulitis. - Orthopedic surgery following, appreciate insight recommendations, tolerated debridement on 03/15/19 with wound VAC placement without incident. Repeat debridement tolerated well 03/18/19 per Ortho - ID on board - recommending echo - unfortunately poorly visualized; hold off on TEE -Discontinue vancomycin, cefepime; transition to Bactrim per ID - Previously grew staph lugdunensis at wound care outpatient clinic; preliminary culture here shows Proteus  Morbid obesity - Estimated body mass index is 36.49 kg/m as calculated from the following:      Height as of 12/27/18: 5\' 8"  (1.727 m)      Weight as of this encounter: 108.9 kg   Type II diabetes: not on insulin - Hemoglobin A1c 6.9 - Sliding scale insulin  Antiphospholipid syndrome - On Coumadin with a goal of 2.5-3.5 - Resume heparin gtt per pharmacy - should be able to resume coumadin as early as  03/19/19 per ortho (if no bleeding complications/issues in the next 24h) Lab Results  Component Value Date   INR 1.1 03/20/2019   INR 1.1 03/19/2019   INR 1.2 03/18/2019   Chronic respiratory failure due to COPD, without acute exacerbation -Wears 2L at home around the clock  Hypertension -Resume home medications as tolerated -Currently holding home medications given current blood  pressure  DVT prophylaxis: heparin gtt while INR <2.5 - resume coumadin 03/19/19 Code Status: full  Disposition Plan: Pending clinical course and PT OT, likely home with home health per patient's wishes versus SNF.  Patient may need Lovenox bridge at home for INR. Consults called: orthopedics and general surgery    Past Medical History:  Diagnosis Date  . Antiphospholipid antibody syndrome (HCC)   . CAD S/P percutaneous coronary angioplasty '98, '04, Feb 2017   [facility]  . Carotid arterial disease (HCC) 10/2015   moderate bilateral  . COPD (chronic obstructive pulmonary disease) (HCC)   . Coronary artery disease   . Hypertension   . Non-insulin treated type 2 diabetes mellitus (HCC)   . Pulmonary emboli (HCC) 2011  . RBBB   . Sleep apnea     Past Surgical History:  Procedure Laterality Date  . APPENDECTOMY    . APPLICATION OF WOUND VAC Left 03/18/2019   Procedure: Application Of Wound Vac;  Surgeon: Nadara Mustard, MD;  Location: [facility] OR;  Service: Orthopedics;  Laterality: Left;  . BACK SURGERY  '89, '06  . COLONOSCOPY    . CORONARY ANGIOPLASTY WITH STENT PLACEMENT  '98, '04, Feb 2017  . I & D EXTREMITY Left 03/15/2019   Procedure: DEBRIDEMENT LEFT THIGH;  Surgeon: Nadara Mustard, MD;  Location[facility] OR;  Service: Orthopedics;  Laterality: Left;  . INCISION AND DRAINAGE OF WOUND Left 03/18/2019   Procedure: LEFT THIGH DEBRIDEMENT;  Surgeon: Nadara Mustard, MD;  Location: [facility] OR;  Service: Orthopedics;  Laterality: Left;     reports that he quit smoking about 2 months ago. His smoking use included cigarettes. He smoked 0.50 packs per day. He has never used smokeless tobacco. He reports that he does not drink alcohol or use drugs.  Allergies  Allergen Reactions  . Melatonin Anxiety  . Ramipril     cough    Family History  Problem Relation Age of Onset  . CAD Father 29    Prior to Admission medications   Medication Sig Start Date End Date Taking? Authorizing Provider  albuterol  (PROVENTIL) (5 MG/ML) 0.5% nebulizer solution Take 0.5 mLs (2.5 mg total) by nebulization every 6 (six) hours as needed for wheezing or shortness of breath. 12/27/18   Terald Sleeper, MD  ALPRAZolam Prudy Feeler) 0.5 MG tablet TAKE ONE TABLET (0.5 MG TOTAL) BY MOUTH 3 (THREE) TIMES A DAY AS NEEDED FOR SLEEP OR ANXIETY. 09/13/17   [provider]  aspirin EC 81 MG tablet Take 81 mg by mouth daily.    [provider]  Blood Glucose Monitoring Suppl (GLUCOCOM BLOOD GLUCOSE MONITOR) DEVI 1 each by XX route as directed (For Blood Glucose Monitoring) 08/23/17   [provider]  colchicine 0.6 MG tablet Take 1 tablet (0.6 mg total) by mouth 2 (two) times daily. Patient not taking: Reported on 10/04/2017 02/12/17   Shon Hale, MD  furosemide (LASIX) 40 MG tablet Take 1 tablet (40 mg total) by mouth daily. Patient not taking: Reported on 10/04/2017 02/12/17   Shon Hale, MD  isosorbide mononitrate (IMDUR) 60 MG 24 hr  tablet Take 1 tablet (60 mg total) by mouth daily. Patient not taking: Reported on 10/04/2017 02/13/17   Shon Hale, MD  methylPREDNISolone (MEDROL DOSEPAK) 4 MG TBPK tablet Use as directed on package 12/27/18   Terald Sleeper, MD  metoprolol succinate (TOPROL-XL) 100 MG 24 hr tablet Take 100 mg by mouth daily. 08/04/16   [provider]  metoprolol tartrate (LOPRESSOR) 12.5 mg TABS tablet Take by mouth. 08/22/17 08/22/18  [provider]  nitroGLYCERIN (NITROSTAT) 0.4 MG SL tablet Place 0.4 mg under the tongue every 5 (five) minutes as needed for chest pain.  07/07/16   [provider]  pantoprazole (PROTONIX) 40 MG tablet Take 40 mg by mouth 2 (two) times daily. 06/03/16   [provider]  polyethylene glycol (MIRALAX / GLYCOLAX) packet Take 17 g by mouth daily. Patient not taking: Reported on 10/04/2017 02/12/17   Shon Hale, MD  potassium chloride (MICRO-K) 10 MEQ CR capsule Take by mouth. 08/22/17 08/22/18  [provider]  PROAIR HFA 108 (90 Base) MCG/ACT inhaler Inhale 1-2 puffs into the lungs every 4 (four) hours as needed for wheezing.  07/07/16   [provider]  rosuvastatin (CRESTOR) 10 MG tablet Take 10 mg by mouth every evening. 07/07/16   [provider]  tamsulosin (FLOMAX) 0.4 MG CAPS capsule Take 0.4 mg by mouth daily. 06/05/16   [provider]  Tiotropium Bromide-Olodaterol (STIOLTO RESPIMAT) 2.5-2.5 MCG/ACT AERS Inhale into the lungs. 09/10/17   [provider]  topiramate (TOPAMAX) 50 MG tablet Take 50 mg by mouth 2 (two) times daily. 06/05/16   [provider]  torsemide (DEMADEX) 20 MG tablet Take by mouth. 07/05/17 07/05/18  [provider]  warfarin (COUMADIN) 5 MG tablet Take 5-7.5 mg by mouth See admin instructions. Take 7.5 mg on Monday, Wednesday and Friday then take 5 mg all the other days 07/17/16   [provider]    Physical Exam: Vitals:   03/19/19 7169 03/19/19 1657 03/19/19 2058 03/20/19 0605  BP: 108/60 107/63 (!) 118/57 138/63  Pulse: 77 91 75 87  Resp: 17 18 17 17   Temp: 98.6 F (37 C) 98.3 F (36.8 C) 98.4 F (36.9 C) 98.3 F (36.8 C)  TempSrc: Oral Oral Oral Oral  SpO2: 96% 91% 93% 92%  Weight:      Height:          Constitutional: NAD, calm, comfortable, obese male ENMT: Mucous membranes are moist.  Neck: normal, supple, no masses Respiratory:  Normal respiratory effort. No accessory muscle use.  Cardiovascular: Regular rate and rhythm, + LE edema Abdomen: no tenderness, no masses palpated. Bowel sounds positive.  Musculoskeletal: no clubbing / cyanosis. Normal muscle tone.  Skin: Left leg postoperative bandage and wound VAC intact without leak, draining dark sanguinous fluid. Neurologic: moves all 4 ext without deficit, left lower extremity movement limited by pain  Labs on Admission: I have personally reviewed following labs and imaging studies  CBC: Recent Labs  Lab 03/13/19 1710  03/16/19 0316 03/17/19 0213 03/18/19 0208 03/19/19 0155 03/20/19 0407  WBC 10.1 11.4* 11.7* 10.6* 13.5* 12.2*  NEUTROABS 6.1  --   --   --   --   --   HGB 13.0 11.6* 11.2* 11.3* 10.5* 10.4*  HCT 39.6 36.7* 35.0* 35.0* 31.7* 32.4*  MCV 94.5 96.6 95.1 93.6 92.4 95.6  PLT 353 323 299 310 332 335   Basic Metabolic Panel: Recent Labs  Lab 03/16/19 0316 03/17/19 0213 03/18/19 05/16/19  03/19/19 0155 03/20/19 0407  NA 138 139 140 138 139  K 3.8 3.9 3.7 3.7 3.3*  CL 102 101 101 99 96*  CO2 28 29 27 29 30   GLUCOSE 113* 112* 115* 137* 120*  BUN 10 12 19 21  29*  CREATININE 0.80 0.77 0.90 0.98 1.10  CALCIUM 8.3* 8.6* 8.4* 8.4* 8.6*   GFR: Estimated Creatinine Clearance: 81.2 mL/min (by C-G formula based on SCr of 1.1 mg/dL). Liver Function Tests: Recent Labs  Lab 03/16/19 0316 03/17/19 0213 03/18/19 0208 03/19/19 0155 03/20/19 0407  AST 23 16 19 22 23   ALT 22 19 21 28  32  ALKPHOS 49 48 45 45 45  BILITOT 0.6 0.6 0.5 0.5 0.3  PROT 6.1* 6.4* 6.4* 6.6 6.4*  ALBUMIN 2.9* 2.8* 2.8* 2.9* 2.8*   No results for input(s): LIPASE, AMYLASE in the last 168 hours. No results for input(s): AMMONIA in the last 168 hours. Coagulation Profile: Recent Labs  Lab 03/16/19 1856 03/17/19 0213 03/18/19 0208 03/19/19 0155 03/20/19 0407  INR 1.1 1.1 1.2 1.1 1.1   CBG: Recent Labs  Lab 03/19/19 1157 03/19/19 1700 03/19/19 2113 03/20/19 0735 03/20/19 1205  GLUCAP 145* 167* 132* 139* 108*   Radiological Exams on Admission: [place] EKG SITE RITE  Result Date: 03/19/2019 If Site Rite image not attached, placement could not be confirmed due to current cardiac rhythm.    [place] [facility]   How to contact the [facility] Attending or Consulting provider [place] or covering provider during after hours [place], for this patient?  1. Check the care team in [facility] and look for a) attending/consulting TRH provider listed and b) the [facility] team listed 2. Log into [email or web address] and use  [place] universal password to access. If you do not have the password, please contact the hospital operator. 3. Locate the [facility] provider you are looking for under [facility] and page to a number that you can be directly reached. 4. If you still have difficulty reaching the provider, please page the [facility] (Director on Call) for the Hospitalists listed on amion for assistance.  03/20/2019, 1:45 PM

## 2019-03-20 NOTE — Progress Notes (Signed)
ANTICOAGULATION CONSULT NOTE - Follow Up Consult  Pharmacy Consult for Heparin and Warfarin Indication: Antiphospholipid Syndrome  Allergies  Allergen Reactions  . Melatonin Anxiety  . Ramipril     cough    Patient Measurements: Height: 5\' 8"  (172.7 cm) Weight: 239 lb 13.8 oz (108.8 kg) IBW/kg (Calculated) : 68.4 Heparin Dosing Weight: 92.5 kg  Vital Signs: Temp: 98.3 F (36.8 C) (01/04 0605) Temp Source: Oral (01/04 0605) BP: 138/63 (01/04 0605) Pulse Rate: 87 (01/04 0605)  Labs: Recent Labs    03/18/19 0208 03/18/19 1802 03/19/19 0155 03/20/19 0407  HGB 11.3*  --  10.5* 10.4*  HCT 35.0*  --  31.7* 32.4*  PLT 310  --  332 335  LABPROT 14.6  --  13.6 13.8  INR 1.2  --  1.1 1.1  HEPARINUNFRC 0.47 0.40 0.47 0.53  CREATININE 0.90  --  0.98 1.10    Estimated Creatinine Clearance: 81.2 mL/min (by C-G formula based on SCr of 1.1 mg/dL).  Assessment:  63 YOM on warfarin PTA for history of antiphospholipid antibody syndrome, with an INR goal of 2.5-3.5. Warfarin held on admission for need for I&D of L-thigh abscess. Heparin was started for bridging while warfarin on hold and INR<2. The patient is s/p I&D on 12/30 and 1/2. Warfarin resumed 1/3 pm.    Heparin level remains therapeutic (0.53) on 2150 units/hr.   INR 1.1 after Warfarin 5 mg x 1 on 1/3.     Bactrim DS begun 1/3, which can elevate INR, sometimes significantly.  Planning 10 days per ID.    Patient reports PTA Warfarin regimen is 5 mg daily except 6 mg on Mondays and Fridays.   Goal of Therapy:  Heparin level 0.3-0.7 units/ml INR 2.5 - 3.5 Monitor platelets by anticoagulation protocol: Yes   Plan:   Continue heparin drip at 2150 units/hr  Warfarin 6 mg x 1 today.  Hesitant to increase dose as Bactrim DS may significantly increase INR.  Daily heparin level, PT/INR and CBC.  Will need close monitoring of INR while on Bactrim DS.  Patient is concerned about outpatient INR arrangements, as he needs  venipuncture as opposed to fingersticks.    Decrease ASA from 325 to 81 mg daily as PTA?   [address], Dennie Fetters Phone: [phone] 03/20/2019,1:03 PM

## 2019-03-20 NOTE — Progress Notes (Signed)
Doing well . Pain controlled 100cc in VAC  Transition to oral antibiotic. Should work with PT/OT today. Will need wound vac removal in 1 week

## 2019-03-20 NOTE — Progress Notes (Signed)
Physical Therapy Treatment Patient Details Name: Jose [name] MRN: [id number] DOB: 11/20/1955 Today's Date: 03/20/2019    History of Present Illness Pt is a 64 y/o male s/p multiple L thigh I&D with wound VAC placement. PMH including but not limited to antiphospholipid antibody syndrome on Coumadin with an INR goal of 2.5-3.5, COPD, DM, HTN, CAD s/p CABG and a chronic leg wound.    PT Comments    Continuing work on functional mobility and activity tolerance; noting EXCELLENT progress with gait distance and activity tolerance; Responds well to premedication for pain; Worth noting that he tends to sleep in his recliner, but that he really would like to be able to get in and out of his own bed (it's pretty high per pt); Will plan for stair training next visit   Follow Up Recommendations  Home health PT;Supervision/Assistance - 24 hour     Equipment Recommendations  Rolling walker with 5" wheels(he hopes to not need a 3in1)    Recommendations for Other Services       Precautions / Restrictions Precautions Precautions: Fall Precaution Comments: wound VAC L thigh Restrictions Weight Bearing Restrictions: No LLE Weight Bearing: Weight bearing as tolerated    Mobility  Bed Mobility Overal bed mobility: Needs Assistance Bed Mobility: Supine to Sit     Supine to sit: Min guard(without physical contact)     General bed mobility comments: Smoother motion to get to EOB on L side  Transfers Overall transfer level: Needs assistance Equipment used: Rolling walker (2 wheeled) Transfers: Sit to/from Stand Sit to Stand: Min guard         General transfer comment: Cues for safe hand placement and control of descent to sit  Ambulation/Gait Ambulation/Gait assistance: Min guard(with and without physical contact) Gait Distance (Feet): 220 Feet Assistive device: Rolling walker (2 wheeled) Gait Pattern/deviations: Step-through pattern(emerging)     General Gait Details: Cues to  self-monitor for activity tolerance; Excellent gait distance and overall steady with RW   Stairs             Wheelchair Mobility    Modified Rankin (Stroke Patients Only)       Balance     Sitting balance-[name] Scale: Good       Standing balance-[name] Scale: Poor                              Cognition Arousal/Alertness: Awake/alert Behavior During Therapy: WFL for tasks assessed/performed Overall Cognitive Status: Within Functional Limits for tasks assessed                                        Exercises      General Comments        Pertinent Vitals/Pain Pain Assessment: Faces Faces Pain Scale: Hurts little more Pain Location: lower back and L thigh Pain Descriptors / Indicators: Aching Pain Intervention(s): Monitored during session;Premedicated before session    Home Living                      Prior Function            PT Goals (current goals can now be found in the care plan section) Acute Rehab PT Goals Patient Stated Goal: decrease pain; home soon PT Goal Formulation: With patient Time For Goal Achievement: 04/01/19 Potential to Achieve Goals: Good Progress  towards PT goals: Progressing toward goals    Frequency    Min 3X/week      PT Plan Current plan remains appropriate    Co-evaluation              AM-PAC PT "6 Clicks" Mobility   Outcome Measure  Help needed turning from your back to your side while in a flat bed without using bedrails?: None Help needed moving from lying on your back to sitting on the side of a flat bed without using bedrails?: None Help needed moving to and from a bed to a chair (including a wheelchair)?: A Little Help needed standing up from a chair using your arms (e.g., wheelchair or bedside chair)?: A Little Help needed to walk in hospital room?: A Little Help needed climbing 3-5 steps with a railing? : A Little 6 Click Score: 20    End of Session Equipment  Utilized During Treatment: Gait belt Activity Tolerance: Patient tolerated treatment well Patient left: in chair;with call bell/phone within reach Nurse Communication: Mobility status PT Visit Diagnosis: Other abnormalities of gait and mobility (R26.89);Pain Pain - Right/Left: Left Pain - part of body: Leg     Time: 1024-1100 PT Time Calculation (min) (ACUTE ONLY): 36 min  Charges:  $Gait Training: 23-37 mins                     Roney Marion, [place]  Acute Rehabilitation Services Pager [phone] Office [phone]    [place] 03/20/2019, 1:27 PM

## 2019-03-21 ENCOUNTER — Encounter (HOSPITAL_BASED_OUTPATIENT_CLINIC_OR_DEPARTMENT_OTHER): Payer: BC Managed Care – PPO | Admitting: Internal Medicine

## 2019-03-21 DIAGNOSIS — I2510 Atherosclerotic heart disease of native coronary artery without angina pectoris: Secondary | ICD-10-CM

## 2019-03-21 DIAGNOSIS — G4731 Primary central sleep apnea: Secondary | ICD-10-CM

## 2019-03-21 DIAGNOSIS — J438 Other emphysema: Secondary | ICD-10-CM

## 2019-03-21 DIAGNOSIS — Z9861 Coronary angioplasty status: Secondary | ICD-10-CM

## 2019-03-21 LAB — COMPREHENSIVE METABOLIC PANEL
ALT: 39 U/L (ref 0–44)
AST: 29 U/L (ref 15–41)
Albumin: 2.8 g/dL — ABNORMAL LOW (ref 3.5–5.0)
Alkaline Phosphatase: 46 U/L (ref 38–126)
Anion gap: 11 (ref 5–15)
BUN: 25 mg/dL — ABNORMAL HIGH (ref 8–23)
CO2: 26 mmol/L (ref 22–32)
Calcium: 8.9 mg/dL (ref 8.9–10.3)
Chloride: 101 mmol/L (ref 98–111)
Creatinine, Ser: 0.97 mg/dL (ref 0.61–1.24)
GFR calc Af Amer: >60 mL/min (ref >60)
GFR calc non Af Amer: >60 mL/min (ref >60)
Glucose, Bld: 133 mg/dL — ABNORMAL HIGH (ref 70–99)
Potassium: 3.9 mmol/L (ref 3.5–5.1)
Sodium: 138 mmol/L (ref 135–145)
Total Bilirubin: 0.2 mg/dL — ABNORMAL LOW (ref 0.3–1.2)
Total Protein: 6.3 g/dL — ABNORMAL LOW (ref 6.5–8.1)

## 2019-03-21 LAB — CBC
HCT: 31.4 % — ABNORMAL LOW (ref 39.0–52.0)
Hemoglobin: 10.3 g/dL — ABNORMAL LOW (ref 13.0–17.0)
MCH: 30.6 pg (ref 26.0–34.0)
MCHC: 32.8 g/dL (ref 30.0–36.0)
MCV: 93.2 fL (ref 80.0–100.0)
Platelets: 294 10*3/uL (ref 150–400)
RBC: 3.37 MIL/uL — ABNORMAL LOW (ref 4.22–5.81)
RDW: 14 % (ref 11.5–15.5)
WBC: 11 10*3/uL — ABNORMAL HIGH (ref 4.0–10.5)
nRBC: 0 % (ref 0.0–0.2)

## 2019-03-21 LAB — GLUCOSE, CAPILLARY
Glucose-Capillary: 129 mg/dL — ABNORMAL HIGH (ref 70–99)
Glucose-Capillary: 130 mg/dL — ABNORMAL HIGH (ref 70–99)
Glucose-Capillary: 123 mg/dL — ABNORMAL HIGH (ref 70–99)
Glucose-Capillary: 152 mg/dL — ABNORMAL HIGH (ref 70–99)

## 2019-03-21 LAB — HEPARIN LEVEL (UNFRACTIONATED): Heparin Unfractionated: 0.61 IU/mL (ref 0.30–0.70)

## 2019-03-21 LAB — PROTIME-INR
INR: 1.1 (ref 0.8–1.2)
Prothrombin Time: 14.3 seconds (ref 11.4–15.2)

## 2019-03-21 MED ORDER — ENOXAPARIN SODIUM 120 MG/0.8ML ~~LOC~~ SOLN
110.0000 mg | Freq: Two times a day (BID) | SUBCUTANEOUS | Status: DC
  Administered 2019-03-21 – 2019-03-22 (×3): 110 mg via SUBCUTANEOUS
  Filled 2019-03-21 (×4): qty 0.73

## 2019-03-21 MED ORDER — WARFARIN SODIUM 5 MG PO TABS
10.0000 mg | ORAL_TABLET | Freq: Once | ORAL | Status: AC
  Administered 2019-03-21: 10 mg via ORAL
  Filled 2019-03-21: qty 2

## 2019-03-21 NOTE — Progress Notes (Signed)
Physical Therapy Treatment Patient Details Name: Jose [name] MRN: [id number] DOB: Jun 17, 1955 Today's Date: 03/21/2019    History of Present Illness Pt is a 64 y/o male s/p multiple L thigh I&D with wound VAC placement. PMH including but not limited to antiphospholipid antibody syndrome on Coumadin with an INR goal of 2.5-3.5, COPD, DM, HTN, CAD s/p CABG and a chronic leg wound.    PT Comments    Continuing work on functional mobility and activity tolerance;  Session focused on stair training, and initiating amb with single point cane; Overall managed stairs well with cane; We took time to discuss options for getting in and out of his high bed, and ultimately it will be best for him to practice with HHPT  Follow Up Recommendations  Home health PT;Supervision/Assistance - 24 hour     Equipment Recommendations  Rolling walker with 5" wheels;Cane(RW for general amb, cane for stari negotiation; We discussed that it is likely insurance will only pay for one device)    Recommendations for Other Services       Precautions / Restrictions Precautions Precautions: Fall Precaution Comments: wound VAC L thigh Restrictions Weight Bearing Restrictions: No LLE Weight Bearing: Weight bearing as tolerated    Mobility  Bed Mobility Overal bed mobility: Needs Assistance Bed Mobility: Rolling;Sidelying to Sit Rolling: Supervision Sidelying to sit: Supervision       General bed mobility comments: Slow moving, but did not need physical assist  Transfers Overall transfer level: Needs assistance Equipment used: Rolling walker (2 wheeled) Transfers: Sit to/from Stand Sit to Stand: Min guard(without physical contact)         General transfer comment: Cues for safe hand placement and control of descent to sit  Ambulation/Gait Ambulation/Gait assistance: Min guard(with and without physical contact) Gait Distance (Feet): 60 Feet(Total) Assistive device: Rolling walker (2 wheeled);Straight  cane Gait Pattern/deviations: Step-through pattern(emerging)     General Gait Details: Walked short distance in room and in Ortho gym throughout session with RW and some trials with single point cane; Cues for sequence, no overt loss of balance, and he seems to like the cane   Stairs Stairs: Yes Stairs assistance: Min guard Stair Management: Step to pattern;Forwards;With cane;One rail Right Number of Stairs: 3 General stair comments: Verbal and demo cues for sequence and technique; overall performed well with occasional correction cues   Wheelchair Mobility    Modified Rankin (Stroke Patients Only)       Balance                                            Cognition Arousal/Alertness: Awake/alert Behavior During Therapy: WFL for tasks assessed/performed Overall Cognitive Status: Within Functional Limits for tasks assessed                                        Exercises      General Comments General comments (skin integrity, edema, etc.): Very focused on his labs and Coumadin after he spoke with Dr. Catha Gosselin; Incr time to transport to and from Ortho gym; We did discuss getting in and out of the car is similar to getting in and out of the transport chair, and problem-solved through ways to assist LLE in and out of car/bed      Pertinent Vitals/Pain Pain Assessment:  Faces Faces Pain Scale: Hurts a little bit Pain Location: lower back and L thigh Pain Descriptors / Indicators: Aching Pain Intervention(s): Monitored during session    Home Living                      Prior Function            PT Goals (current goals can now be found in the care plan section) Acute Rehab PT Goals Patient Stated Goal: decrease pain; home soon PT Goal Formulation: With patient Time For Goal Achievement: 04/01/19 Potential to Achieve Goals: Good Progress towards PT goals: Progressing toward goals    Frequency    Min 3X/week      PT  Plan Current plan remains appropriate    Co-evaluation              AM-PAC PT "6 Clicks" Mobility   Outcome Measure  Help needed turning from your back to your side while in a flat bed without using bedrails?: None Help needed moving from lying on your back to sitting on the side of a flat bed without using bedrails?: None Help needed moving to and from a bed to a chair (including a wheelchair)?: A Little Help needed standing up from a chair using your arms (e.g., wheelchair or bedside chair)?: A Little Help needed to walk in hospital room?: A Little Help needed climbing 3-5 steps with a railing? : A Little 6 Click Score: 20    End of Session Equipment Utilized During Treatment: Gait belt Activity Tolerance: Patient tolerated treatment well Patient left: in chair;with call bell/phone within reach Nurse Communication: Mobility status PT Visit Diagnosis: Other abnormalities of gait and mobility (R26.89);Pain Pain - Right/Left: Left Pain - part of body: Leg     Time: 0842-0955(minus approx 15 minutes while MD was in) PT Time Calculation (min) (ACUTE ONLY): 73 min  Charges:  $Gait Training: 23-37 mins $Therapeutic Activity: 23-37 mins                     Roney Marion, PT  Acute Rehabilitation Services Pager [phone] Office [place] 03/21/2019, 10:56 AM

## 2019-03-21 NOTE — Progress Notes (Signed)
Doing well. PT went well. From ortho can discharge . Patient has several questions regarding anticoagulation and need for insulin. He will discuss with Internal medicine. Will need 1 week follow up with Dr. Lajoyce Corners

## 2019-03-21 NOTE — Progress Notes (Signed)
ANTICOAGULATION CONSULT NOTE - Follow Up Consult  Pharmacy Consult for Heparin > Lovenox and Warfarin Indication: Antiphospholipid Syndrome  Allergies  Allergen Reactions  . Melatonin Anxiety  . Ramipril     cough    Patient Measurements: Height: 5\' 8"  (172.7 cm) Weight: 239 lb 13.8 oz (108.8 kg) IBW/kg (Calculated) : 68.4 Heparin Dosing Weight: 92.5 kg  Vital Signs: Temp: 98.2 F (36.8 C) (01/05 1524) Temp Source: Oral (01/05 1524) BP: 96/52 (01/05 1524) Pulse Rate: 82 (01/05 1524)  Labs: Recent Labs    03/19/19 0155 03/20/19 0407 03/21/19 0529  HGB 10.5* 10.4* 10.3*  HCT 31.7* 32.4* 31.4*  PLT 332 335 294  LABPROT 13.6 13.8 14.3  INR 1.1 1.1 1.1  HEPARINUNFRC 0.47 0.53 0.61  CREATININE 0.98 1.10 0.97    Estimated Creatinine Clearance: 92.1 mL/min (by C-G formula based on SCr of 0.97 mg/dL).  Assessment:  63 YOM on warfarin PTA for history of antiphospholipid antibody syndrome, with an INR goal of 2.5-3.5. Warfarin held on admission for need for I&D of L-thigh abscess. Heparin was started for bridging while warfarin on hold and INR<2. The patient is s/p I&D on 12/30 and 1/2. Warfarin resumed 1/3 pm.    Heparin level remains therapeutic (0.61) on 2150 units/hr.   INR 1.1 after Warfarin 5 mg x 1 on 1/3 and 6 mg on 1/4.    Bactrim DS begun 1/3, which can elevate INR, sometimes significantly, though no effect seen to date. Planning 10 days per ID.     Patient reports PTA Warfarin regimen is 5 mg daily except 6 mg on Mondays and Fridays.     To transition from IV heparin to SQ Lovenox tonight.  RN to instruct on self-administration.  Goal of Therapy:  Heparin level 0.3-0.7 units/ml Anti-Xa level 0.6-1 units/ml 4hrs after LMWH dose given INR 2.5 - 3.5 Monitor platelets by anticoagulation protocol: Yes   Plan:   Stop heparin at 5pm.  Begin Lovenox 110 mg SQ q12hrs at 6pm.  Warfarin 10 mg x 1 today. Discussed with patient. I was hesitant to increase dose  yesterday, as Bactrim DS may significantly increase INR, but no effect seen to date.  Daily PT/INR and CBC.  Will need close monitoring of INR while on Bactrim DS and after course is completed.  Patient reports outpatient INR check planned for 1/7.  Aspirin decreased from 325 to 81 mg.   3/7, RPh Phone: [phone] 03/21/2019,4:37 PM

## 2019-03-21 NOTE — TOC Progression Note (Addendum)
Transition of Care [facility]) - Progression Note    Patient Details  Name: Jose [name] MRN: [id number] Date of Birth: 02/25/1956  Transition of [facility]) CM/SW Contact  Nadene Rubins Adria Devon, RN Phone Number: 03/21/2019, 12:03 PM  Clinical Narrative:    Patient requesting home health RN to drawn blood for INR check at home (not finger stick). Confirmed face sheet information with patient. Patient wants results of INR called to DR Jola Babinski with [facility]. Patient's INR , RN is Kyra Searles.   Provided patient with Medicare.gov home health list. Patient has no preference. NCM will start calling agencies to see if they have staffing. Spoke to Dr Catha Gosselin , first blood draw would be tomorrow 03/22/19.    Alroy Bailiff with [facility] awaiting call back.   [facility] Ports unable to accept referral.  Kandee Keen with [facility] checking also. Cory with [facility] unable to accept referral.  Eber Jones with [facility] unable to accept due to staffing.  Called Joanie with [facility] , Dorina Hoyer unable to accept referral.   Raylene Miyamoto with [facility] . [facility] unable to accept due to staffing.  Elnita Maxwell with [facility] unable to accept due to insurance .  Cassie with [facility] unable to accept due to insurance.   Misty Stanley with [facility] unable to accept referral.   Left Drew with [facility] a message awaiting call back.   Tiffany with [facility] has accepted referral start of care would be Thursday March 23, 2019 . Sent MD message and patient aware.  Called Zack with [facility] for walker and cane  Expected Discharge Plan: Home w Home Health Services    Expected Discharge Plan and Services Expected Discharge Plan: Home w Home Health Services   Discharge Planning Services: CM Consult   Living arrangements for the past 2 months: Single Family Home                                       Social Determinants  of Health (SDOH) Interventions    Readmission Risk Interventions No flowsheet data found.

## 2019-03-21 NOTE — Discharge Instructions (Signed)

## 2019-03-21 NOTE — Progress Notes (Signed)
PROGRESS NOTE    Jose [name]  GHW:[id number] DOB: 1955-05-07 DOA: 03/13/2019 PCP: [name], MD   Brief Narrative:  HPI On 03/14/2019 by Dr. Marlin Canary Jose [name] is a 64 y.o. male with medical history significant of antiphospholipid antibody syndrome on Coumadin with an INR goal of 2.5-3.5, COPD, type 2 diabetes not on insulin, sleep apnea, hypertension and a chronic leg wound.  Per patient he has had this leg wound for the last 2 years but over the last week he has had increasing pain, redness, and copious discharge.  He has seen Dr. Leanord Hawking at a wound care center and had his wound culture which grew staph Lugdunensis.  His wound care physician called him in a course of doxycycline but patient has not yet started because he is concerned that he needs IV antibiotics and his inability to manage the drainage.  He denies fever.  In the ER a CT scan of his thigh was obtained that shows a thick walled abscess in the medial distal thigh communicating with an open wound on the skin with surrounding cellulitis.  Patient was initially seen by the general surgery team in the ER who recommended an orthopedic surgery consult given location and proximity to the knee joint.  Patient was seen by orthopedics in the ER and initial recommendations are for ABIs.  If his ABIs are within normal limits the plan is to operate for a washout and wound VAC placement.  Patient's situation is complicated by his need for Coumadin and the need for a heparin bridge.  Interim history Patient was admitted with acute on chronic complex L leg wound with abscess.  Orthopedic surgery and infectious disease consulted and appreciated.  Patient did tolerate debridement on 03/15/2027, repeat debridement on 03/18/19 with wound VAC placement.  Has now been transitioned to Bactrim.  Now pending pubic INR, currently being bridged with heparin.  TOC consulted for home health. Assessment & Plan   Acute on chronic complex left leg wound  with abscess -Present on admission  -Left medial thigh -Patient did not meet sepsis criteria on admission -CT scan shows a thickened walled abscess with a communicating opening to the skin and cellulitis -Orthopedic surgery, Dr. Lajoyce Corners consulted and appreciated, tolerated debridement on 03/15/2019 with wound VAC placement, repeat debridement on 03/18/2019 -Infectious disease consulted and appreciated was recommending echocardiogram-which showed EF of 55 to 60%, grade 1 diastolic dysfunction.  For endocardial definition, suboptimal echo windows.  Not adequate for ruling out endocarditis -Hold off on TEE -Patient initially placed on vancomycin and cefepime, transition to Bactrim -Previously patient grew staph lugdunensis at outpatient wound clinic -Wound culture shows Proteus mirabilis, pansensitive -PT recommending home health, cane  Morbid obesity -BMI 36.49 -Patient to follow-up with PCP to discuss lifestyle modification  Diabetes mellitus, type II -Patient not on insulin -hemoglobin A1c 6.9 -continue insulin sliding scale CBG monitoring  Antiphospholipid syndrome -Patient is on Coumadin normally, with a goal INR of 2.5-3.5 -Given recent surgery Coumadin was held, currently on heparin GTT per pharmacy -Coumadin restarted on 03/19/2019, however INR currently 1.1 -TOC consulted for home health to draw labs and have them sent to patient's Coumadin clinic.  Until this can be arranged, will continue heparin.  Chronic respiratory failure due to COPD -Without exacerbation -Patient wears 2 L of home oxygen at baseline  Essential hypertension -BP on the softer side  DVT Prophylaxis  heparin  Code Status: Full  Family Communication: None at bedside  Disposition Plan: Admitted. Pending arrangement of  HH.   Consultants Orthopedic surgery Infectious disease  Procedures  Echocardiogram Debridement with wound VAC placement x2  Antibiotics   Anti-infectives (From admission, onward)    Start     Dose/Rate Route Frequency Ordered Stop   03/19/19 1430  sulfamethoxazole-trimethoprim (BACTRIM DS) 800-160 MG per tablet 1 tablet     1 tablet Oral Every 12 hours 03/19/19 1417     03/18/19 0600  ceFAZolin (ANCEF) IVPB 2g/100 mL premix  Status:  Discontinued     2 g 200 mL/hr over 30 Minutes Intravenous On call to O.R. 03/17/19 1719 03/18/19 0907   03/18/19 0600  ceFAZolin (ANCEF) IVPB 2g/100 mL premix  Status:  Discontinued     2 g 200 mL/hr over 30 Minutes Intravenous On call to O.R. 03/17/19 1719 03/17/19 1721   03/17/19 1600  ceFEPIme (MAXIPIME) 2 g in sodium chloride 0.9 % 100 mL IVPB  Status:  Discontinued     2 g 200 mL/hr over 30 Minutes Intravenous Every 8 hours 03/17/19 1552 03/19/19 1417   03/15/19 1630  ceFAZolin (ANCEF) IVPB 2g/100 mL premix     2 g 200 mL/hr over 30 Minutes Intravenous On call to O.R. 03/15/19 1016 03/15/19 1617   03/14/19 2030  vancomycin (VANCOCIN) IVPB 1000 mg/200 mL premix  Status:  Discontinued     1,000 mg 200 mL/hr over 60 Minutes Intravenous Every 12 hours 03/14/19 0742 03/19/19 1417   03/14/19 0730  vancomycin (VANCOREADY) IVPB 2000 mg/400 mL     2,000 mg 200 mL/hr over 120 Minutes Intravenous  Once 03/14/19 0723 03/14/19 1043      Subjective:   [name] seen and examined today.  Patient with no complaints other than wanting to go home today.  Feels that he should not have to stay in the hospital until his INR is therapeutic.  Denies current chest pain, shortness of breath, abdominal pain, nausea or vomiting, diarrhea or constipation, dizziness or headache. Objective:   Vitals:   03/20/19 1410 03/20/19 2146 03/21/19 0436 03/21/19 1524  BP: 114/65 116/63 (!) 110/54 (!) 96/52  Pulse: 92 85 88 82  Resp:  14 18 18   Temp: 99 F (37.2 C) 98.7 F (37.1 C) 98.9 F (37.2 C) 98.2 F (36.8 C)  TempSrc: Oral Oral Oral Oral  SpO2: 94% 93% 94% 92%  Weight:      Height:        Intake/Output Summary (Last 24 hours) at 03/21/2019  1541 Last data filed at 03/21/2019 1230 Gross per 24 hour  Intake 2335.23 ml  Output 3150 ml  Net -814.77 ml   Filed Weights   03/13/19 1702 03/15/19 1429  Weight: 108.9 kg 108.8 kg    Exam  General: Well developed, well nourished, NAD, appears stated age  HEENT: NCAT, mucous membranes moist.   Cardiovascular: S1 S2 auscultated, RRR, 2/6 SEM  Respiratory: Clear to auscultation bilaterally   Abdomen: Soft, nontender, nondistended, + bowel sounds  Extremities: warm dry without cyanosis clubbing or edema, dressing in place on LLE  Neuro: AAOx3, nonfocal  Psych: Appropriate mood and affect, pleasant   Data Reviewed: I have personally reviewed following labs and imaging studies  CBC: Recent Labs  Lab 03/17/19 0213 03/18/19 0208 03/19/19 0155 03/20/19 0407 03/21/19 0529  WBC 11.7* 10.6* 13.5* 12.2* 11.0*  HGB 11.2* 11.3* 10.5* 10.4* 10.3*  HCT 35.0* 35.0* 31.7* 32.4* 31.4*  MCV 95.1 93.6 92.4 95.6 93.2  PLT 299 310 332 335 294   Basic Metabolic Panel: Recent Labs  Lab 03/17/19 0213 03/18/19 0208 03/19/19 0155 03/20/19 0407 03/21/19 0529  NA 139 140 138 139 138  K 3.9 3.7 3.7 3.3* 3.9  CL 101 101 99 96* 101  CO2 29 27 29 30 26   GLUCOSE 112* 115* 137* 120* 133*  BUN 12 19 21  29* 25*  CREATININE 0.77 0.90 0.98 1.10 0.97  CALCIUM 8.6* 8.4* 8.4* 8.6* 8.9   GFR: Estimated Creatinine Clearance: 92.1 mL/min (by C-G formula based on SCr of 0.97 mg/dL). Liver Function Tests: Recent Labs  Lab 03/17/19 0213 03/18/19 0208 03/19/19 0155 03/20/19 0407 03/21/19 0529  AST 16 19 22 23 29   ALT 19 21 28  32 39  ALKPHOS 48 45 45 45 46  BILITOT 0.6 0.5 0.5 0.3 0.2*  PROT 6.4* 6.4* 6.6 6.4* 6.3*  ALBUMIN 2.8* 2.8* 2.9* 2.8* 2.8*   No results for input(s): LIPASE, AMYLASE in the last 168 hours. No results for input(s): AMMONIA in the last 168 hours. Coagulation Profile: Recent Labs  Lab 03/17/19 0213 03/18/19 0208 03/19/19 0155 03/20/19 0407 03/21/19 0529   INR 1.1 1.2 1.1 1.1 1.1   Cardiac Enzymes: No results for input(s): CKTOTAL, CKMB, CKMBINDEX, TROPONINI in the last 168 hours. BNP (last 3 results) No results for input(s): PROBNP in the last 8760 hours. HbA1C: No results for input(s): HGBA1C in the last 72 hours. CBG: Recent Labs  Lab 03/20/19 1205 03/20/19 1634 03/20/19 2140 03/21/19 0737 03/21/19 1203  GLUCAP 108* 130* 145* 129* 130*   Lipid Profile: No results for input(s): CHOL, HDL, LDLCALC, TRIG, CHOLHDL, LDLDIRECT in the last 72 hours. Thyroid Function Tests: No results for input(s): TSH, T4TOTAL, FREET4, T3FREE, THYROIDAB in the last 72 hours. Anemia Panel: No results for input(s): VITAMINB12, FOLATE, FERRITIN, TIBC, IRON, RETICCTPCT in the last 72 hours. Urine analysis: No results found for: COLORURINE, APPEARANCEUR, LABSPEC, PHURINE, GLUCOSEU, HGBUR, BILIRUBINUR, KETONESUR, [place], UROBILINOGEN, NITRITE, LEUKOCYTESUR Sepsis Labs: @LABRCNTIP (procalcitonin:4,lacticidven:4)  ) Recent Results (from the past 240 hour(s))  Culture, blood (Routine x 2)     Status: None   Collection Time: 03/13/19  5:10 PM   Specimen: BLOOD  Result Value Ref Range Status   Specimen Description BLOOD RIGHT ANTECUBITAL  Final   Special Requests   Final    BOTTLES DRAWN AEROBIC AND ANAEROBIC Blood Culture adequate volume   Culture   Final    NO GROWTH 5 DAYS Performed at [facility], [address], [place], [place] [address]    Report Status 03/18/2019 FINAL  Final  Culture, blood (Routine x 2)     Status: None   Collection Time: 03/13/19  5:15 PM   Specimen: BLOOD  Result Value Ref Range Status   Specimen Description BLOOD LEFT ANTECUBITAL  Final   Special Requests   Final    BOTTLES DRAWN AEROBIC AND ANAEROBIC Blood Culture adequate volume   Culture   Final    NO GROWTH 5 DAYS Performed at [facility], [place] [address], [place], [place] [address]    Report Status 03/18/2019 FINAL  Final  SARS CORONAVIRUS 2  (TAT 6-24 HRS) Nasopharyngeal Nasopharyngeal Swab     Status: None   Collection Time: 03/14/19  9:45 PM   Specimen: Nasopharyngeal Swab  Result Value Ref Range Status   SARS Coronavirus 2 NEGATIVE NEGATIVE Final    Comment: (NOTE) SARS-CoV-2 target nucleic acids are NOT DETECTED. The SARS-CoV-2 RNA is generally detectable in upper and lower respiratory specimens during the acute phase of infection. Negative results do  not preclude SARS-CoV-2 infection, do not rule out co-infections with other pathogens, and should not be used as the sole basis for treatment or other patient management decisions. Negative results must be combined with clinical observations, patient history, and epidemiological information. The expected result is Negative. Fact Sheet for Patients: HairSlick.no Fact Sheet for Healthcare Providers: quierodirigir.com This test is not yet approved or cleared by the [place] FDA and  has been authorized for detection and/or diagnosis of SARS-CoV-2 by FDA under an Emergency Use Authorization (EUA). This EUA will remain  in effect (meaning this test can be used) for the duration of the COVID-19 declaration under Section 56 4(b)(1) of the Act, 21 U.S.C. section 360bbb-3(b)(1), unless the authorization is terminated or revoked sooner. Performed at [facility], [address] [place], [place] [address]   Surgical pcr screen     Status: None   Collection Time: 03/15/19 10:38 AM   Specimen: Nasal Mucosa; Nasal Swab  Result Value Ref Range Status   MRSA, PCR NEGATIVE NEGATIVE Final   Staphylococcus aureus NEGATIVE NEGATIVE Final    Comment: (NOTE) The Xpert SA Assay (FDA approved for NASAL specimens in patients 59 years of age and older), is one component of a comprehensive surveillance program. It is not intended to diagnose infection nor to guide or monitor treatment. Performed at [facility],  [address] [place], [place] [address]   Aerobic/Anaerobic Culture (surgical/deep wound)     Status: None   Collection Time: 03/15/19  4:52 PM   Specimen: Soft Tissue, Other  Result Value Ref Range Status   Specimen Description LEFT THIGH  Final   Special Requests NONE  Final   Gram Stain   Final    ABUNDANT WBC PRESENT, PREDOMINANTLY PMN FEW GRAM POSITIVE COCCI RARE GRAM POSITIVE RODS    Culture   Final    RARE PROTEUS MIRABILIS WITHIN MIXED ORGANISMS NO ANAEROBES ISOLATED Performed at [facility], [address], [place], [place] [address]    Report Status 03/20/2019 FINAL  Final   Organism ID, Bacteria PROTEUS MIRABILIS  Final      Susceptibility   Proteus mirabilis - MIC*    AMPICILLIN <=2 SENSITIVE Sensitive     CEFAZOLIN <=4 SENSITIVE Sensitive     CEFEPIME <=0.12 SENSITIVE Sensitive     CEFTAZIDIME <=1 SENSITIVE Sensitive     CEFTRIAXONE <=0.25 SENSITIVE Sensitive     CIPROFLOXACIN <=0.25 SENSITIVE Sensitive     GENTAMICIN <=1 SENSITIVE Sensitive     IMIPENEM 1 SENSITIVE Sensitive     TRIMETH/SULFA <=20 SENSITIVE Sensitive     AMPICILLIN/SULBACTAM <=2 SENSITIVE Sensitive     PIP/TAZO <=4 SENSITIVE Sensitive     * RARE PROTEUS MIRABILIS      Radiology Studies: No results found.   Scheduled Meds: . aspirin EC  81 mg Oral Daily  . docusate sodium  100 mg Oral BID  . gabapentin  600 mg Oral TID  . insulin aspart  0-15 Units Subcutaneous TID WC  . insulin aspart  0-5 Units Subcutaneous QHS  . pantoprazole  40 mg Oral BID  . rosuvastatin  10 mg Oral QPM  . sulfamethoxazole-trimethoprim  1 tablet Oral Q12H  . tamsulosin  0.8 mg Oral Daily  . Tiotropium Bromide-Olodaterol  2 puff Inhalation Daily  . tiZANidine  4 mg Oral TID  . topiramate  50 mg Oral BID  . torsemide  40 mg Oral Daily  . Warfarin - Pharmacist Dosing Inpatient  Does not apply q1800   Continuous Infusions: . sodium chloride    . heparin 2,150 Units/hr (03/21/19 1226)  . lactated  ringers 10 mL/hr at 03/18/19 0720  . methocarbamol (ROBAXIN) IV       LOS: 7 days   Time Spent in minutes   45 minutes  Conda Wannamaker D.O. on 03/21/2019 at 3:41 PM  Between 7am to 7pm - Please see pager noted on [email or web address]  After 7pm go to [email or web address]  And look for the night coverage person covering for me after hours  [facility] Office  [phone]

## 2019-03-22 LAB — BASIC METABOLIC PANEL
Anion gap: 9 (ref 5–15)
BUN: 25 mg/dL — ABNORMAL HIGH (ref 8–23)
CO2: 23 mmol/L (ref 22–32)
Calcium: 8.6 mg/dL — ABNORMAL LOW (ref 8.9–10.3)
Chloride: 102 mmol/L (ref 98–111)
Creatinine, Ser: 0.91 mg/dL (ref 0.61–1.24)
GFR calc Af Amer: >60 mL/min (ref >60)
GFR calc non Af Amer: >60 mL/min (ref >60)
Glucose, Bld: 213 mg/dL — ABNORMAL HIGH (ref 70–99)
Potassium: 4.5 mmol/L (ref 3.5–5.1)
Sodium: 134 mmol/L — ABNORMAL LOW (ref 135–145)

## 2019-03-22 LAB — COMPREHENSIVE METABOLIC PANEL
ALT: 58 U/L — ABNORMAL HIGH (ref 0–44)
AST: 45 U/L — ABNORMAL HIGH (ref 15–41)
Albumin: 3 g/dL — ABNORMAL LOW (ref 3.5–5.0)
Alkaline Phosphatase: 51 U/L (ref 38–126)
Anion gap: 10 (ref 5–15)
BUN: 32 mg/dL — ABNORMAL HIGH (ref 8–23)
CO2: 28 mmol/L (ref 22–32)
Calcium: 8.8 mg/dL — ABNORMAL LOW (ref 8.9–10.3)
Chloride: 99 mmol/L (ref 98–111)
Creatinine, Ser: 1.46 mg/dL — ABNORMAL HIGH (ref 0.61–1.24)
GFR calc Af Amer: 58 mL/min — ABNORMAL LOW (ref >60)
GFR calc non Af Amer: 50 mL/min — ABNORMAL LOW (ref >60)
Glucose, Bld: 140 mg/dL — ABNORMAL HIGH (ref 70–99)
Potassium: 4.1 mmol/L (ref 3.5–5.1)
Sodium: 137 mmol/L (ref 135–145)
Total Bilirubin: 0.3 mg/dL (ref 0.3–1.2)
Total Protein: 6.3 g/dL — ABNORMAL LOW (ref 6.5–8.1)

## 2019-03-22 LAB — CBC
HCT: 31.8 % — ABNORMAL LOW (ref 39.0–52.0)
Hemoglobin: 10.2 g/dL — ABNORMAL LOW (ref 13.0–17.0)
MCH: 30.4 pg (ref 26.0–34.0)
MCHC: 32.1 g/dL (ref 30.0–36.0)
MCV: 94.9 fL (ref 80.0–100.0)
Platelets: 316 10*3/uL (ref 150–400)
RBC: 3.35 MIL/uL — ABNORMAL LOW (ref 4.22–5.81)
RDW: 14.1 % (ref 11.5–15.5)
WBC: 12.9 10*3/uL — ABNORMAL HIGH (ref 4.0–10.5)
nRBC: 0 % (ref 0.0–0.2)

## 2019-03-22 LAB — GLUCOSE, CAPILLARY
Glucose-Capillary: 131 mg/dL — ABNORMAL HIGH (ref 70–99)
Glucose-Capillary: 103 mg/dL — ABNORMAL HIGH (ref 70–99)

## 2019-03-22 LAB — PROTIME-INR
INR: 1.1 (ref 0.8–1.2)
Prothrombin Time: 14 seconds (ref 11.4–15.2)

## 2019-03-22 MED ORDER — AMOXICILLIN-POT CLAVULANATE 875-125 MG PO TABS: 1.0000 | ORAL_TABLET | Freq: Two times a day (BID) | ORAL | 0 refills | Status: AC

## 2019-03-22 MED ORDER — DOXYCYCLINE HYCLATE 100 MG PO TABS
100.0000 mg | ORAL_TABLET | Freq: Two times a day (BID) | ORAL | Status: DC
  Administered 2019-03-22: 100 mg via ORAL
  Filled 2019-03-22: qty 1

## 2019-03-22 MED ORDER — ENOXAPARIN (LOVENOX) PATIENT EDUCATION KIT
PACK | Freq: Once | Status: DC
  Filled 2019-03-22: qty 1

## 2019-03-22 MED ORDER — AMOXICILLIN-POT CLAVULANATE 875-125 MG PO TABS
1.0000 | ORAL_TABLET | Freq: Two times a day (BID) | ORAL | Status: DC
  Administered 2019-03-22: 10:00:00 1 via ORAL
  Filled 2019-03-22: qty 1

## 2019-03-22 MED ORDER — ENOXAPARIN SODIUM 120 MG/0.8ML ~~LOC~~ SOLN: 110.0000 mg | Freq: Two times a day (BID) | SUBCUTANEOUS | 0 refills | Status: DC

## 2019-03-22 MED ORDER — WARFARIN SODIUM 5 MG PO TABS
10.0000 mg | ORAL_TABLET | Freq: Once | ORAL | Status: AC
  Administered 2019-03-22: 18:00:00 10 mg via ORAL
  Filled 2019-03-22: qty 2

## 2019-03-22 MED ORDER — AMOXICILLIN-POT CLAVULANATE 875-125 MG PO TABS: 1.0000 | ORAL_TABLET | Freq: Two times a day (BID) | ORAL | 0 refills | Status: DC

## 2019-03-22 MED ORDER — SODIUM CHLORIDE 0.9 % IV SOLN: INTRAVENOUS | Status: DC

## 2019-03-22 MED ORDER — DOXYCYCLINE HYCLATE 100 MG PO TABS: 100.0000 mg | ORAL_TABLET | Freq: Two times a day (BID) | ORAL | 0 refills | Status: DC

## 2019-03-22 NOTE — Progress Notes (Signed)
Physical Therapy Treatment Patient Details Name: Jose [name] MRN: [id number] DOB: [birth year] Today's Date: 03/22/2019    History of Present Illness Pt is a 64 y/o male s/p multiple L thigh I&D with wound VAC placement. PMH including but not limited to antiphospholipid antibody syndrome on Coumadin with an INR goal of 2.5-3.5, COPD, DM, HTN, CAD s/p CABG and a chronic leg wound.    PT Comments    Continuing work on functional mobility and activity tolerance;  Performed good knee extension and flexion therex; educated pt on keeping good knee ROM during the healing process; walked with straight cane overall well; Still, When you consider his back pain, I like the RW better for walking, and use of the cane for stair negotiation; OK for dc home from PT standpoint    Follow Up Recommendations  Home health PT;Supervision/Assistance - 24 hour     Equipment Recommendations  Rolling walker with 5" wheels;Cane    Recommendations for Other Services       Precautions / Restrictions Precautions Precautions: Fall Precaution Comments: wound VAC L thigh Restrictions Weight Bearing Restrictions: No LLE Weight Bearing: Weight bearing as tolerated    Mobility  Bed Mobility Overal bed mobility: Needs Assistance Bed Mobility: Rolling;Sidelying to Sit Rolling: Supervision Sidelying to sit: Supervision       General bed mobility comments: pt in recliner upon arrival  Transfers Overall transfer level: Needs assistance Equipment used: Rolling walker (2 wheeled) Transfers: Sit to/from Stand Sit to Stand: Min guard Stand pivot transfers: Min guard       General transfer comment: Cues for safe hand placement and control of descent to sit  Ambulation/Gait Ambulation/Gait assistance: Min guard(with and without physical contact) Gait Distance (Feet): 200 Feet Assistive device: Straight cane Gait Pattern/deviations: Step-through pattern Gait velocity: slow   General Gait Details: Slower and  more cautious, but managed well; Overall he seemed to like the cane, but we did discuss the likelihood that bilateral support from the RW will be better for his back   Stairs             Wheelchair Mobility    Modified Rankin (Stroke Patients Only)       Balance Overall balance assessment: Needs assistance   Sitting balance-[name] Scale: Good     Standing balance support: Bilateral upper extremity supported;Single extremity supported;During functional activity Standing balance-[name] Scale: Fair                              Cognition Arousal/Alertness: Awake/alert Behavior During Therapy: WFL for tasks assessed/performed Overall Cognitive Status: Within Functional Limits for tasks assessed                                        Exercises Total Joint Exercises Knee Flexion: AROM;Left;5 reps;Seated General Exercises - Lower Extremity Quad Sets: AROM;Both;10 reps Short Arc Quad: AROM;Left;10 reps    General Comments        Pertinent Vitals/Pain Pain Assessment: Faces Pain Score: 5  Faces Pain Scale: Hurts little more Pain Location: lower back and L inner thigh Pain Descriptors / Indicators: Aching Pain Intervention(s): Monitored during session    Home Living Family/patient expects to be discharged to:: Private residence Living Arrangements: Spouse/significant other Available Help at Discharge: Family;Available PRN/intermittently Type of Home: Mobile home Home Access: Stairs to enter Entrance Stairs-Rails: Right Home Layout:  One level Home Equipment: Emergency planning/management officer - 2 wheels;Hand held shower head      Prior Function Level of Independence: Independent      Comments: drives, 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   PT Goals (current goals can now be found in the care plan section) Acute Rehab PT Goals Patient Stated Goal: decrease pain; home soon PT Goal Formulation: With patient Time For Goal Achievement:  04/01/19 Potential to Achieve Goals: Good Progress towards PT goals: Progressing toward goals    Frequency    Min 3X/week      PT Plan Current plan remains appropriate    Co-evaluation              AM-PAC PT "6 Clicks" Mobility   Outcome Measure  Help needed turning from your back to your side while in a flat bed without using bedrails?: None Help needed moving from lying on your back to sitting on the side of a flat bed without using bedrails?: None Help needed moving to and from a bed to a chair (including a wheelchair)?: A Little Help needed standing up from a chair using your arms (e.g., wheelchair or bedside chair)?: A Little Help needed to walk in hospital room?: A Little Help needed climbing 3-5 steps with a railing? : A Little 6 Click Score: 20    End of Session Equipment Utilized During Treatment: Gait belt Activity Tolerance: Patient tolerated treatment well Patient left: in chair;with call bell/phone within reach Nurse Communication: Mobility status PT Visit Diagnosis: Other abnormalities of gait and mobility (R26.89);Pain Pain - Right/Left: Left Pain - part of body: Leg     Time: 9407-6808 PT Time Calculation (min) (ACUTE ONLY): 36 min  Charges:  $Gait Training: 8-22 mins $Therapeutic Exercise: 8-22 mins                     Van Clines, PT  Acute Rehabilitation Services Pager [phone] Office [phone]    Jose [name] 03/22/2019, 4:32 PM

## 2019-03-22 NOTE — Plan of Care (Signed)
  Problem: Education: Goal: Required Educational Video(s) Outcome: Adequate for Discharge   Problem: Clinical Measurements: Goal: Ability to maintain clinical measurements within normal limits will improve Outcome: Adequate for Discharge Goal: Postoperative complications will be avoided or minimized Outcome: Adequate for Discharge   Problem: Skin Integrity: Goal: Demonstration of wound healing without infection will improve Outcome: Adequate for Discharge   

## 2019-03-22 NOTE — Discharge Summary (Signed)
Physician Discharge Summary  Jose [name] SEG:[id number] DOB: 06/11/1955 DOA: 03/13/2019  PCP: Elenore Paddy, MD  Admit date: 03/13/2019 Discharge date: 03/22/2019  Time spent: 45 minutes  Recommendations for Outpatient Follow-up:  Patient will be discharged to home with home health physical and occupational therapy and nursing. Patient will need to follow up with primary care provider within one week of discharge, repeat BMP.  Follow up with coumadin clinic. Follow up with orthopedics.Patient should continue medications as prescribed.  Patient should follow a heart healthy/carb modified diet.   Discharge Diagnoses:  Acute on chronic complex left leg wound with abscess Morbid obesity Diabetes mellitus, type II Antiphospholipid syndrome Chronic respiratory failure due to COPD Essential hypertension AKI  Discharge Condition: Stable  Diet recommendation: heart healthy/carb modified   Filed Weights   03/13/19 1702 03/15/19 1429  Weight: 108.9 kg 108.8 kg    History of present illness:  On 03/14/2019 by Dr. Charlett Lango a 64 y.o.malewith medical history significant ofantiphospholipid antibody syndrome on Coumadin with an INR goal of 2.5-3.5,COPD, type 2 diabetes not on insulin, sleep apnea, hypertension and a chronic leg wound.Per patient he has had this leg wound for the last 2 years but over the last week he has had increasing pain, redness, and copious discharge. He has seen Dr. Dellia Nims at a wound care center and had his wound culture which grew staph Lugdunensis.His wound care physician called him in a course of doxycycline but patient has not yet started because he is concerned that he needs IV antibioticsand his inability to manage the drainage. He denies fever. In the ER a CT scan of his thigh was obtained that shows a thick walled abscess in the medial distal thigh communicating with an open wound on the skin with surrounding cellulitis. Patient was  initially seen by the general surgery team in the ER who recommended an orthopedic surgery consult given location and proximity to the knee joint.Patient was seen by orthopedics in the ER and initial recommendations are for ABIs. If his ABIs are within normal limits the plan is to operate for a washout and wound VAC placement. Patient's situation is complicated by his need for Coumadin and the need for a heparin bridge.  Hospital Course:  Acute on chronic complex left leg wound with abscess -Present on admission  -Left medial thigh -Patient did not meet sepsis criteria on admission -CT scan shows a thickened walled abscess with a communicating opening to the skin and cellulitis -Orthopedic surgery, Dr. Sharol Given consulted and appreciated, tolerated debridement on 03/15/2019 with wound VAC placement, repeat debridement on 03/18/2019 -Infectious disease consulted and appreciated was recommending echocardiogram-which showed EF of 55 to 73%, grade 1 diastolic dysfunction.  For endocardial definition, suboptimal echo windows.  Not adequate for ruling out endocarditis -Hold off on TEE -Patient initially placed on vancomycin and cefepime, transition to Bactrim- however given AKI, placed patient on doxycycline and Augmentin. -Previously patient grew staph lugdunensis at outpatient wound clinic -Wound culture shows Proteus mirabilis, pansensitive -PT recommending home health, cane -OT recommended HH, 3in1 bedside commode, tub/shower bench, reacher, sock aid, LH shoehorn, LH bath sponge  Morbid obesity -BMI 36.49 -Patient to follow-up with PCP to discuss lifestyle modification  Diabetes mellitus, type II -Patient not on insulin -hemoglobin A1c 6.9  Antiphospholipid syndrome -Patient is on Coumadin normally, with a goal INR of 2.5-3.5 -Given recent surgery Coumadin was held, currently on heparin GTT per pharmacy -Coumadin restarted on 03/19/2019, however INR currently 1.1 -TOC consulted for home  health to draw labs and have them sent to patient's Coumadin clinic.   -Patient started on Lovenox, patient given teaching on Lovenox injections  Chronic respiratory failure due to COPD -Without exacerbation -Patient wears 2 L of home oxygen at baseline  Essential hypertension -Continue metoprolol on discharge  Acute kidney injury -possibly due to meds, torsemide held, bactrim discontinued -patient placed on gentle IVF  -repeat Cr 0.9 (baseline)  Consultants Orthopedic surgery Infectious disease  Procedures  Echocardiogram Debridement with wound VAC placement x2  Discharge Exam: Vitals:   03/22/19 0626 03/22/19 1406  BP: (!) 106/54 (!) 113/47  Pulse: 88 84  Resp: 20 18  Temp: 98.3 F (36.8 C) 98.7 F (37.1 C)  SpO2: 93% 95%     General: Well developed, well nourished, NAD, appears stated age  HEENT: NCAT, mucous membranes moist.  Extremities: warm dry without cyanosis clubbing or edema  Neuro: AAOx3,nonfocal  Psych: Appropriate mood and affect  Discharge Instructions Discharge Instructions    Discharge instructions   Complete by: As directed    Patient will be discharged to home with home health physical and occupational therapy and nursing. Patient will need to follow up with primary care provider within one week of discharge, repeat BMP. Follow up with orthopedics.  Follow up with coumadin clinic. Patient should continue medications as prescribed.  Patient should follow a heart healthy/carb modified diet.  Take an over the counter probiotic while on antibiotics.   Neg Press Wound Therapy / Incisional   Complete by: As directed    Show patient how to attach prevena  vac     Allergies as of 03/22/2019      Reactions   Melatonin Anxiety   Ramipril    cough      Medication List    TAKE these medications   amoxicillin-clavulanate 875-125 MG tablet Commonly known as: AUGMENTIN Take 1 tablet by mouth every 12 (twelve) hours for 7 days.   aspirin EC  81 MG tablet Take 81 mg by mouth daily. Notes to patient: Start in am   doxycycline 100 MG tablet Commonly known as: VIBRA-TABS Take 1 tablet (100 mg total) by mouth every 12 (twelve) hours. Notes to patient: Start tonight   enoxaparin 120 MG/0.8ML injection Commonly known as: LOVENOX Inject 0.73 mLs (110 mg total) into the skin every 12 (twelve) hours for 7 days. Notes to patient: Start tonight   Fluticasone Propionate (Inhal) 100 MCG/BLIST Aepb Inhale 1 puff into the lungs 2 (two) times daily. Notes to patient: Per home dose   gabapentin 300 MG capsule Commonly known as: NEURONTIN Take 600 mg by mouth 3 (three) times daily. Notes to patient: tonight   GlucoCom Blood Glucose Monitor Devi 1 each by XX route as directed (For Blood Glucose Monitoring)   HYDROcodone-acetaminophen 10-325 MG tablet Commonly known as: NORCO Take 1 tablet by mouth 2 (two) times daily. Notes to patient: Per home dose   metoprolol tartrate 12.5 mg Tabs tablet Commonly known as: LOPRESSOR Take 12.5 mg by mouth 2 (two) times daily. Notes to patient: Per home dose   nitroGLYCERIN 0.4 MG SL tablet Commonly known as: NITROSTAT Place 0.4 mg under the tongue every 5 (five) minutes as needed for chest pain.   pantoprazole 40 MG tablet Commonly known as: PROTONIX Take 40 mg by mouth 2 (two) times daily. Notes to patient: tonight   ProAir HFA 108 (90 Base) MCG/ACT inhaler Generic drug: albuterol Inhale 1-2 puffs into the lungs every 4 (four) hours as  needed for wheezing.   albuterol (5 MG/ML) 0.5% nebulizer solution Commonly known as: PROVENTIL Take 0.5 mLs (2.5 mg total) by nebulization every 6 (six) hours as needed for wheezing or shortness of breath.   rosuvastatin 10 MG tablet Commonly known as: CRESTOR Take 10 mg by mouth every evening. Notes to patient: tonight   Stiolto Respimat 2.5-2.5 MCG/ACT Aers Generic drug: Tiotropium Bromide-Olodaterol Inhale 2 puffs into the lungs daily. Notes  to patient: In am   tamsulosin 0.4 MG Caps capsule Commonly known as: FLOMAX Take 0.4 mg by mouth daily. Notes to patient: In am   tiZANidine 4 MG tablet Commonly known as: ZANAFLEX Take 4 mg by mouth 3 (three) times daily. Notes to patient: tonight   topiramate 50 MG tablet Commonly known as: TOPAMAX Take 50 mg by mouth 2 (two) times daily.   torsemide 20 MG tablet Commonly known as: DEMADEX Take 40 mg by mouth daily. Notes to patient: Per home dose   warfarin 5 MG tablet Commonly known as: COUMADIN Take 5-6 mg by mouth daily. 5 mg daily, except 6 mg on Mondays and Fridays Notes to patient: Per home dose            Durable Medical Equipment  (From admission, onward)         Start     Ordered   03/21/19 1214  For home use only DME Cane  Once     03/21/19 1214   03/21/19 1203  For home use only DME Walker rolling  Once    Question:  Patient needs a walker to treat with the following condition  Answer:  Weakness   03/21/19 1203   03/21/19 0749  For home use only DME Walker rolling  Once    Question:  Patient needs a walker to treat with the following condition  Answer:  Abscess of left hip   03/21/19 0749         Allergies  Allergen Reactions  . Melatonin Anxiety  . Ramipril     cough   Follow-up Information    Nadara Mustard, MD In 1 week.   Specialty: Orthopedic Surgery Contact information: [address] [place] [address] [phone]        [name], MD. Schedule an appointment as soon as possible for a visit in 1 week(s).   Specialty: Internal Medicine Why: Hospital follow up Contact information: [address] Libel [place] [address] [phone]            The results of significant diagnostics from this hospitalization (including imaging, microbiology, ancillary and laboratory) are listed below for reference.    Significant Diagnostic Studies: CT FEMUR LEFT W CONTRAST  Result Date: 03/14/2019 CLINICAL DATA:   Thigh pain and swelling. EXAM: CT OF THE LOWER RIGHT EXTREMITY WITH CONTRAST TECHNIQUE: Multidetector CT imaging of the lower right extremity was performed according to the standard protocol following intravenous contrast administration. COMPARISON:  None. CONTRAST:  OMNIPAQUE IOHEXOL 300 MG/ML  SOLN FINDINGS: Skin thickening and subcutaneous soft tissue swelling/edema throughout the entire left thigh consistent with cellulitis. More distally there is a discrete thick-walled abscess communicating with an open wound on the skin. This contains fluid and gas. It is located distally just above the knee joint medially. This measures a maximum of 10.3 x 6.6 x 2.4 cm. This is superficial to the sartorius muscle. I do not see any definite muscle involvement. The muscular compartment structures appear normal. No findings for myofasciitis or pyomyositis.  There is a nearby surgical clip likely due to vein harvesting from bypass surgery 2 years ago. The hip and knee joints are maintained. I do not see any findings suspicious for septic arthritis. The femur is intact. No evidence of osteomyelitis. The visualized bony pelvis is intact. SI joint degenerative changes are noted and mild hip joint degenerative changes. The major vascular structures are patent. Scattered atherosclerotic calcifications are noted. No evidence of deep venous thrombosis. No significant intrapelvic abnormalities are identified. No inguinal mass or adenopathy. IMPRESSION: 1. 10 x 6.5 x 2.5 cm thick walled abscess involving the medial distal thigh and communicating with an open wound on the skin. 2. Diffuse cellulitis but no findings for myofasciitis, pyomyositis, septic arthritis or osteomyelitis. Electronically Signed   By: Rudie Meyer M.D.   On: 03/14/2019 09:24   VAS [place] ABI WITH/WO TBI  Result Date: 03/16/2019 LOWER EXTREMITY DOPPLER STUDY Indications: Ulceration. High Risk Factors: Hypertension, coronary artery disease.  Limitations:  Today's exam was limited due to bandages. Comparison Study: no prior Performing Technologist: Blanch Media RVS  Examination Guidelines: A complete evaluation includes at minimum, Doppler waveform signals and systolic blood pressure reading at the level of bilateral brachial, anterior tibial, and posterior tibial arteries, when vessel segments are accessible. Bilateral testing is considered an integral part of a complete examination. Photoelectric Plethysmograph (PPG) waveforms and toe systolic pressure readings are included as required and additional duplex testing as needed. Limited examinations for reoccurring indications may be performed as noted.  ABI Findings: +--------+------------------+-----+---------+--------+ Right   Rt Pressure (mmHg)IndexWaveform Comment  +--------+------------------+-----+---------+--------+ [id number]                    triphasic         +--------+------------------+-----+---------+--------+ PTA     104               0.74 biphasic          +--------+------------------+-----+---------+--------+ DP      79                0.56 biphasic          +--------+------------------+-----+---------+--------+ +--------+------------------+-----+---------+--------------------------------+ Left    Lt Pressure (mmHg)IndexWaveform Comment                          +--------+------------------+-----+---------+--------------------------------+ [id number]                    triphasic                                 +--------+------------------+-----+---------+--------------------------------+ PTA                                     unable to obtain due to bandages +--------+------------------+-----+---------+--------------------------------+ DP      123               0.87 biphasic                                  +--------+------------------+-----+---------+--------------------------------+ +-------+-----------+-----------+------------+------------+  ABI/TBIToday's ABIToday's TBIPrevious ABIPrevious TBI +-------+-----------+-----------+------------+------------+ Right  0.74                                           +-------+-----------+-----------+------------+------------+  Left   0.87                                           +-------+-----------+-----------+------------+------------+  Summary: Right: Resting right ankle-brachial index indicates moderate right lower extremity arterial disease. Left: Resting left ankle-brachial index indicates mild left lower extremity arterial disease.  *See table(s) above for measurements and observations.  Electronically signed by Waverly Ferrari MD on 03/16/2019 at 3:08:50 PM.   Final    ECHOCARDIOGRAM COMPLETE  Result Date: 03/16/2019   ECHOCARDIOGRAM REPORT   Patient Name:   Jose [name] Date of Exam: 03/16/2019 Medical Rec #:  [id number]     Height:       68.0 in Accession #:    [id number]    Weight:       239.9 lb Date of Birth:  Jul 19, 1955      BSA:          2.21 m Patient Age:    63 years      BP:           105/67 mmHg Patient Gender: M             HR:           98 bpm. Exam Location:  Inpatient Procedure: 2D Echo Indications:    Endocarditis I38  History:        Patient has prior history of Echocardiogram examinations, most                 recent 02/11/2017. CAD, Prior CABG, COPD, Arrythmias:RBBB; Risk                 Factors:Hypertension, Diabetes, Sleep Apnea and Former Smoker.                 Abscess of the left thigh. Carotid artery disease.  Sonographer:    Leta Jungling RDCS Referring Phys: [id number] CORNELIUS N VAN DAM  Sonographer Comments: Suboptimal parasternal window, suboptimal apical window and suboptimal subcostal window. Patient choose not to use definity IMPRESSIONS  1. Left ventricular ejection fraction, by visual estimation, is 55 to 60%. The left ventricle has normal function. There is mildly increased left ventricular hypertrophy.  2. Left ventricular diastolic parameters are  consistent with Grade I diastolic dysfunction (impaired relaxation).  3. Global right ventricle was not well visualized.The right ventricular size is not well visualized. Right vetricular wall thickness was not assessed.  4. Left atrial size was not well visualized.  5. Right atrial size was not well visualized.  6. The mitral valve was not well visualized. Trivial mitral valve regurgitation.  7. The tricuspid valve is not well visualized.  8. Aortic valve area, by VTI measures 1.35 cm.  9. Aortic valve mean gradient measures 10.0 mmHg. 10. Aortic valve peak gradient measures 24.6 mmHg. 11. The aortic valve was not well visualized. Aortic valve regurgitation is not visualized. Mild to moderate aortic valve stenosis. 12. The pulmonic valve was not well visualized. Pulmonic valve regurgitation is not visualized. 13. The interatrial septum was not well visualized. 14. Poor endocardial definition, suboptimal echo windows. Patient refused Definity contrast. Study is inadequate to r/o endocarditis. Consider TEE if strong clinical suspicion. FINDINGS  Left Ventricle: Left ventricular ejection fraction, by visual estimation, is 55 to 60%. The left ventricle has normal function. The left ventricle is not well visualized. There is mildly increased left ventricular hypertrophy.  Left ventricular diastolic  parameters are consistent with Grade I diastolic dysfunction (impaired relaxation). Indeterminate filling pressures. Right Ventricle: The right ventricular size is not well visualized. Right vetricular wall thickness was not assessed. Global RV systolic function is was not well visualized. Left Atrium: Left atrial size was not well visualized. Right Atrium: Right atrial size was not well visualized Pericardium: There is no evidence of pericardial effusion. Mitral Valve: The mitral valve was not well visualized. Trivial mitral valve regurgitation. Tricuspid Valve: The tricuspid valve is not well visualized. Tricuspid valve  regurgitation is not demonstrated. Aortic Valve: The aortic valve was not well visualized. Aortic valve regurgitation is not visualized. Mild to moderate aortic stenosis is present. Aortic valve mean gradient measures 10.0 mmHg. Aortic valve peak gradient measures 24.6 mmHg. Aortic valve area, by VTI measures 1.35 cm. Pulmonic Valve: The pulmonic valve was not well visualized. Pulmonic valve regurgitation is not visualized. Pulmonic regurgitation is not visualized. Aorta: The aortic root and ascending aorta are structurally normal, with no evidence of dilitation. IAS/Shunts: The interatrial septum was not well visualized.  LEFT VENTRICLE PLAX 2D LVIDd:         5.20 cm  Diastology LVIDs:         4.10 cm  LV e' lateral:   8.38 cm/s LV PW:         1.10 cm  LV E/e' lateral: 10.8 LV IVS:        1.30 cm  LV e' medial:    7.29 cm/s LVOT diam:     1.90 cm  LV E/e' medial:  12.4 LV SV:         55 ml LV SV Index:   23.74 LVOT Area:     2.84 cm  LEFT ATRIUM         Index LA diam:    3.30 cm 1.49 cm/m  AORTIC VALVE AV Area (Vmax):    0.90 cm AV Area (Vmean):   1.11 cm AV Area (VTI):     1.35 cm AV Vmax:           248.00 cm/s AV Vmean:          141.000 cm/s AV VTI:            0.277 m AV Peak Grad:      24.6 mmHg AV Mean Grad:      10.0 mmHg LVOT Vmax:         78.30 cm/s LVOT Vmean:        55.100 cm/s LVOT VTI:          0.132 m LVOT/AV VTI ratio: 0.48  AORTA Ao Asc diam: 3.10 cm MITRAL VALVE MV Area (PHT): 4.15 cm             SHUNTS MV PHT:        53.07 msec           Systemic VTI:  0.13 m MV Decel Time: 183 msec             Systemic Diam: 1.90 cm MV E velocity: 90.40 cm/s 103 cm/s MV A velocity: 93.80 cm/s 70.3 cm/s MV E/A ratio:  0.96       1.5  Zoila ShutterKenneth Hilty MD Electronically signed by Zoila ShutterKenneth Hilty MD Signature Date/Time: 03/16/2019/11:24:53 AM    Final    US EKG SITE RITE  Result Date: 03/19/2019 If Site Rite image not attached, placement could not be confirmed due to current cardiac  rhythm.   Microbiology: Recent Results (  from the past 240 hour(s))  Culture, blood (Routine x 2)     Status: None   Collection Time: 03/13/19  5:10 PM   Specimen: BLOOD  Result Value Ref Range Status   Specimen Description BLOOD RIGHT ANTECUBITAL  Final   Special Requests   Final    BOTTLES DRAWN AEROBIC AND ANAEROBIC Blood Culture adequate volume   Culture   Final    NO GROWTH 5 DAYS Performed at [facility], [address], [place], [place] [address]    Report Status 03/18/2019 FINAL  Final  Culture, blood (Routine x 2)     Status: None   Collection Time: 03/13/19  5:15 PM   Specimen: BLOOD  Result Value Ref Range Status   Specimen Description BLOOD LEFT ANTECUBITAL  Final   Special Requests   Final    BOTTLES DRAWN AEROBIC AND ANAEROBIC Blood Culture adequate volume   Culture   Final    NO GROWTH 5 DAYS Performed at [facility], [address] [place], [place] [address]    Report Status 03/18/2019 FINAL  Final  SARS CORONAVIRUS 2 (TAT 6-24 HRS) Nasopharyngeal Nasopharyngeal Swab     Status: None   Collection Time: 03/14/19  9:45 PM   Specimen: Nasopharyngeal Swab  Result Value Ref Range Status   SARS Coronavirus 2 NEGATIVE NEGATIVE Final    Comment: (NOTE) SARS-CoV-2 target nucleic acids are NOT DETECTED. The SARS-CoV-2 RNA is generally detectable in upper and lower respiratory specimens during the acute phase of infection. Negative results do not preclude SARS-CoV-2 infection, do not rule out co-infections with other pathogens, and should not be used as the sole basis for treatment or other patient management decisions. Negative results must be combined with clinical observations, patient history, and epidemiological information. The expected result is Negative. Fact Sheet for Patients: HairSlick.no Fact Sheet for Healthcare Providers: quierodirigir.com This test is not yet approved or cleared by  the [place] FDA and  has been authorized for detection and/or diagnosis of SARS-CoV-2 by FDA under an Emergency Use Authorization (EUA). This EUA will remain  in effect (meaning this test can be used) for the duration of the COVID-19 declaration under Section 56 4(b)(1) of the Act, 21 U.S.C. section 360bbb-3(b)(1), unless the authorization is terminated or revoked sooner. Performed at [facility], [address] [place], [place] [address]   Surgical pcr screen     Status: None   Collection Time: 03/15/19 10:38 AM   Specimen: Nasal Mucosa; Nasal Swab  Result Value Ref Range Status   MRSA, PCR NEGATIVE NEGATIVE Final   Staphylococcus aureus NEGATIVE NEGATIVE Final    Comment: (NOTE) The Xpert SA Assay (FDA approved for NASAL specimens in patients 27 years of age and older), is one component of a comprehensive surveillance program. It is not intended to diagnose infection nor to guide or monitor treatment. Performed at [facility], [address] [place], [place] [address]   Aerobic/Anaerobic Culture (surgical/deep wound)     Status: None   Collection Time: 03/15/19  4:52 PM   Specimen: Soft Tissue, Other  Result Value Ref Range Status   Specimen Description LEFT THIGH  Final   Special Requests NONE  Final   Gram Stain   Final    ABUNDANT WBC PRESENT, PREDOMINANTLY PMN FEW GRAM POSITIVE COCCI RARE GRAM POSITIVE RODS    Culture   Final    RARE PROTEUS MIRABILIS WITHIN MIXED ORGANISMS NO ANAEROBES ISOLATED Performed  at [facility], [address] [place], [place] [address]    Report Status 03/20/2019 FINAL  Final   Organism ID, Bacteria PROTEUS MIRABILIS  Final      Susceptibility   Proteus mirabilis - MIC*    AMPICILLIN <=2 SENSITIVE Sensitive     CEFAZOLIN <=4 SENSITIVE Sensitive     CEFEPIME <=0.12 SENSITIVE Sensitive     CEFTAZIDIME <=1 SENSITIVE Sensitive     CEFTRIAXONE <=0.25 SENSITIVE Sensitive     CIPROFLOXACIN <=0.25 SENSITIVE  Sensitive     GENTAMICIN <=1 SENSITIVE Sensitive     IMIPENEM 1 SENSITIVE Sensitive     TRIMETH/SULFA <=20 SENSITIVE Sensitive     AMPICILLIN/SULBACTAM <=2 SENSITIVE Sensitive     PIP/TAZO <=4 SENSITIVE Sensitive     * RARE PROTEUS MIRABILIS     Labs: Basic Metabolic Panel: Recent Labs  Lab 03/19/19 0155 03/20/19 0407 03/21/19 0529 03/22/19 0127 03/22/19 1504  NA 138 139 138 137 134*  K 3.7 3.3* 3.9 4.1 4.5  CL 99 96* 101 99 102  CO2 29 30 26 28 23   GLUCOSE 137* 120* 133* 140* 213*  BUN 21 29* 25* 32* 25*  CREATININE 0.98 1.10 0.97 1.46* 0.91  CALCIUM 8.4* 8.6* 8.9 8.8* 8.6*   Liver Function Tests: Recent Labs  Lab 03/18/19 0208 03/19/19 0155 03/20/19 0407 03/21/19 0529 03/22/19 0127  AST 19 22 23 29  45*  ALT 21 28 32 39 58*  ALKPHOS 45 45 45 46 51  BILITOT 0.5 0.5 0.3 0.2* 0.3  PROT 6.4* 6.6 6.4* 6.3* 6.3*  ALBUMIN 2.8* 2.9* 2.8* 2.8* 3.0*   No results for input(s): LIPASE, AMYLASE in the last 168 hours. No results for input(s): AMMONIA in the last 168 hours. CBC: Recent Labs  Lab 03/18/19 0208 03/19/19 0155 03/20/19 0407 03/21/19 0529 03/22/19 0127  WBC 10.6* 13.5* 12.2* 11.0* 12.9*  HGB 11.3* 10.5* 10.4* 10.3* 10.2*  HCT 35.0* 31.7* 32.4* 31.4* 31.8*  MCV 93.6 92.4 95.6 93.2 94.9  PLT 310 332 335 294 316   Cardiac Enzymes: No results for input(s): CKTOTAL, CKMB, CKMBINDEX, TROPONINI in the last 168 hours. BNP: BNP (last 3 results) Recent Labs    12/27/18 0741  BNP 126.5*    ProBNP (last 3 results) No results for input(s): PROBNP in the last 8760 hours.  CBG: Recent Labs  Lab 03/21/19 1203 03/21/19 1659 03/21/19 2143 03/22/19 0737 03/22/19 1128  GLUCAP 130* 123* 152* 131* 103*       Signed:  Secily Walthour  [facility] 03/22/2019, 4:48 PM

## 2019-03-22 NOTE — Evaluation (Signed)
Occupational Therapy Evaluation Patient Details Name: Jose [name] MRN: [id number] DOB: [birth year] Today's Date: 03/22/2019    History of Present Illness Pt is a 64 y/o male s/p multiple L thigh I&D with wound VAC placement. PMH including but not limited to antiphospholipid antibody syndrome on Coumadin with an INR goal of 2.5-3.5, COPD, DM, HTN, CAD s/p CABG and a chronic leg wound.   Clinical Impression   Pt is at min - mod A with LB ADLs/selfcare and min guard A with ADL mobility using RW. Pt lives at home with his wife and was independent with ADLs/selfcare and mobility. Pt will have assist at home from his wife and may possibly d/c home today and start HH the following 1-2 days. Educated pt on use of tub bench and ADL A/E for LE selfcare for home use, pt declined handouts. All education completed and no further acute OT is indicated at this time, will defer further OT intervention to [facility]    Follow Up Recommendations  Home health OT    Equipment Recommendations  3 in 1 bedside commode;Tub/shower bench;Other (comment)(reacher, sock aid, LH shoe horn, LH bath sponge)    Recommendations for Other Services       Precautions / Restrictions Precautions Precautions: Fall Precaution Comments: wound VAC L thigh Restrictions Weight Bearing Restrictions: No LLE Weight Bearing: Weight bearing as tolerated      Mobility Bed Mobility               General bed mobility comments: pt in recliner upon arrival  Transfers   Equipment used: Rolling walker (2 wheeled) Transfers: Sit to/from Stand Sit to Stand: Min guard Stand pivot transfers: Min guard            Balance Overall balance assessment: Needs assistance   Sitting balance-[name] Scale: Good     Standing balance support: Bilateral upper extremity supported;Single extremity supported;During functional activity Standing balance-[name] Scale: Poor                             ADL either performed or assessed  with clinical judgement   ADL Overall ADL's : Needs assistance/impaired Eating/Feeding: Independent;Sitting   Grooming: Wash/dry hands;Wash/dry face;Set up;Supervision/safety;Standing   Upper Body Bathing: Set up;Independent;Sitting   Lower Body Bathing: Minimal assistance   Upper Body Dressing : Set up;Independent;Sitting   Lower Body Dressing: Minimal assistance   Toilet Transfer: Min guard;Ambulation;RW;Cueing for safety   Toileting- Clothing Manipulation and Hygiene: Supervision/safety;Sit to/from stand       Functional mobility during ADLs: Min guard;Rolling walker;Cueing for safety General ADL Comments: educated pt on use of tub bench and ADL A/E for LE selfcare for home use, pt declined handouts     Vision Baseline Vision/History: Wears glasses Wears Glasses: At all times Patient Visual Report: No change from baseline       Perception     Praxis      Pertinent Vitals/Pain Pain Assessment: 0-10 Pain Score: 5  Pain Location: lower back and L thigh Pain Descriptors / Indicators: Aching Pain Intervention(s): Monitored during session;Repositioned     Hand Dominance Right   Extremity/Trunk Assessment Upper Extremity Assessment Upper Extremity Assessment: Overall WFL for tasks assessed   Lower Extremity Assessment Lower Extremity Assessment: Defer to PT evaluation       Communication Communication Communication: No difficulties   Cognition Arousal/Alertness: Awake/alert Behavior During Therapy: WFL for tasks assessed/performed Overall Cognitive Status: Within Functional Limits for tasks assessed  General Comments       Exercises     Shoulder Instructions      Home Living Family/patient expects to be discharged to:: Private residence Living Arrangements: Spouse/significant other Available Help at Discharge: Family;Available PRN/intermittently Type of Home: Mobile home Home Access: Stairs to  enter Entrance Stairs-Number of Steps: 5 Entrance Stairs-Rails: Right Home Layout: One level     Bathroom Shower/Tub: Teacher, early years/pre: Standard     Home Equipment: Clinical cytogeneticist - 2 wheels;Hand held shower head          Prior Functioning/Environment Level of Independence: Independent        Comments: drives, 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 Problem List: Impaired balance (sitting and/or standing);Pain;Decreased activity tolerance;Decreased knowledge of use of DME or AE      OT Treatment/Interventions:      OT Goals(Current goals can be found in the care plan section) Acute Rehab OT Goals Patient Stated Goal: decrease pain; home soon OT Goal Formulation: With patient  OT Frequency:     Barriers to D/C:            Co-evaluation              AM-PAC OT "6 Clicks" Daily Activity     Outcome Measure Help from another person eating meals?: None Help from another person taking care of personal grooming?: None Help from another person toileting, which includes using toliet, bedpan, or urinal?: A Little Help from another person bathing (including washing, rinsing, drying)?: A Little Help from another person to put on and taking off regular upper body clothing?: None Help from another person to put on and taking off regular lower body clothing?: A Little 6 Click Score: 21   End of Session Equipment Utilized During Treatment: Rolling walker  Activity Tolerance: Patient tolerated treatment well Patient left: in chair;with call bell/phone within reach  OT Visit Diagnosis: Other abnormalities of gait and mobility (R26.89);Pain Pain - Right/Left: Left Pain - part of body: Leg                Time: 0045-9977 OT Time Calculation (min): 31 min Charges:  OT General Charges $OT Visit: 1 Visit OT Evaluation $OT Eval Low Complexity: 1 Low OT Treatments $Therapeutic Activity: 8-22 mins    Britt Bottom 03/22/2019, 2:37 PM

## 2019-03-22 NOTE — Progress Notes (Signed)
ANTICOAGULATION CONSULT NOTE - Follow Up Consult  Pharmacy Consult for: Lovenox and Warfarin Indication: Antiphospholipid Syndrome  Allergies  Allergen Reactions  . Melatonin Anxiety  . Ramipril     cough    Patient Measurements: Height: 5\' 8"  (172.7 cm) Weight: 239 lb 13.8 oz (108.8 kg) IBW/kg (Calculated) : 68.4 Lovenox Dosing Weight: 108.8 kg  Vital Signs: Temp: 98.3 F (36.8 C) (01/06 0626) Temp Source: Oral (01/06 0626) BP: 106/54 (01/06 0626) Pulse Rate: 88 (01/06 0626)  Labs: Recent Labs    03/20/19 0407 03/21/19 0529 03/22/19 0127  HGB 10.4* 10.3* 10.2*  HCT 32.4* 31.4* 31.8*  PLT 335 294 316  LABPROT 13.8 14.3 14.0  INR 1.1 1.1 1.1  HEPARINUNFRC 0.53 0.61  --   CREATININE 1.10 0.97 1.46*    Estimated Creatinine Clearance: 61.2 mL/min (A) (by C-G formula based on SCr of 1.46 mg/dL (H)).  Assessment:  63 YOM on warfarin PTA for history of antiphospholipid antibody syndrome, with an INR goal of 2.5-3.5. Warfarin held on admission for need for I&D of L-thigh abscess. Heparin was started for bridging while warfarin on hold and INR<2. The patient is s/p I&D on 12/30 and 1/2. Warfarin resumed 1/3 pm.  IV heparin transitioned to Lovenox on 1/5 in anticipation of discharge. Patient reports self-administration performed.    INR remains 1.1 after Warfarin 5 mg, 6 mg, then 10 mg over the last 3 days. Had been anticipating increased INR due to concurrent Bactrim DS, but no effect seen.  Antibiotics changed to Doxycycline and Augmentin this morning with increased creatinine. Hydrating, and Torsemide now held. Doxycycline sometimes effects INR.    Patient reports PTA Warfarin regimen is 5 mg daily except 6 mg on Mondays and Fridays.   Goal of Therapy:  Heparin level 0.3-0.7 units/ml Anti-Xa level 0.6-1 units/ml 4hrs after LMWH dose given INR 2.5 - 3.5 Monitor platelets by anticoagulation protocol: Yes   Plan:   Continue Lovenox 110 mg SQ q12hrs until INR at goal.  Patient reports self-administration performed.  Warfarin 10 mg x 1 again today. Discussed with patient.  Daily PT/INR while inpatient.  Will need close monitoring of INR while on antibiotics and after course is completed. Prior plan was for 10 days of PO antibiotics, thru 03/29/19.    Patient reports outpatient INR check planned for 1/7.  Patient prefers to obtain Lovenox at [facility] usual [facility] Enoxaparin 120 mg syringes in stock.    [place], Dennie Fetters Phone: [phone] 03/22/2019,12:12 PM

## 2019-03-22 NOTE — Progress Notes (Signed)
Jose [name] to be D/C'd  per MD order. Discussed with the patient and all questions fully answered. Wound vac changed to prevana vac. lovenox teaching given to patient.  VSS, Skin clean, dry and intact without evidence of skin break down, no evidence of skin tears noted.  IV catheter discontinued intact. Site without signs and symptoms of complications. Dressing and pressure applied.  An After Visit Summary was printed and given to the patient. Patient received prescription.  D/c education completed with patient/family including follow up instructions, medication list, d/c activities limitations if indicated, with other d/c instructions as indicated by MD - patient able to verbalize understanding, all questions fully answered.   Patient instructed to return to ED, call 911, or call MD for any changes in condition.   Patient to be escorted via WC, and D/C home via private auto.

## 2019-03-27 ENCOUNTER — Telehealth: Payer: Self-pay | Admitting: Orthopedic Surgery

## 2019-03-27 NOTE — Telephone Encounter (Signed)
Patient called.   He needs blood work done and wanted to know if he could get it done while here for his appointment tomorrow at 1:30  Call back number: [phone]

## 2019-03-28 ENCOUNTER — Ambulatory Visit (INDEPENDENT_AMBULATORY_CARE_PROVIDER_SITE_OTHER): Payer: BC Managed Care – PPO | Admitting: Orthopedic Surgery

## 2019-03-28 ENCOUNTER — Encounter: Payer: Self-pay | Admitting: Orthopedic Surgery

## 2019-03-28 ENCOUNTER — Other Ambulatory Visit: Payer: Self-pay

## 2019-03-28 VITALS — Ht 68.0 in | Wt 239.0 lb

## 2019-03-28 DIAGNOSIS — L02416 Cutaneous abscess of left lower limb: Secondary | ICD-10-CM

## 2019-03-28 NOTE — Progress Notes (Signed)
Office Visit Note   Patient: Jose [name]           Date of Birth: 10/06/1955           MRN: [id number] Visit Date: 03/28/2019              Requested by: Elenore Paddy, MD [address] [place],  [place] [id number] PCP: Elenore Paddy, MD  Chief Complaint  Patient presents with  . Left Leg - Routine Post Op    03/18/19 left thigh debridement       HPI: Patient is a 64 year old gentleman who presents in follow-up status post debridement chronic abscess left thigh.  Patient states he is completing his course of Augmentin and doxycycline.  Patient states that his INR is out of control and he will follow-up with his primary care office today to have that checked.  Patient does have a blood dyscrasia.  Assessment & Plan: Visit Diagnoses:  1. Abscess of left thigh     Plan: Recommended a knee-high double extra-large pair compression socks so this can get the compression up above the knee to help with the surgical wound.  Recommended starting Dial soap cleansing with soap and water wearing the compression sock and for the area above this use 4 x 4's and an Ace wrap.  Follow-Up Instructions: Return in about 1 week (around 04/04/2019).   Ortho Exam  Patient is alert, oriented, no adenopathy, well-dressed, normal affect, normal respiratory effort. Examination the surgical incision is well approximated there is no cellulitis there is a small amount of clear serosanguineous drainage.  Imaging: No results found. No images are attached to the encounter.  Labs: Lab Results  Component Value Date   HGBA1C 6.9 (H) 03/15/2019   HGBA1C 5.9 (H) 08/16/2016   REPTSTATUS 03/20/2019 FINAL 03/15/2019   GRAMSTAIN  03/15/2019    ABUNDANT WBC PRESENT, PREDOMINANTLY PMN FEW GRAM POSITIVE COCCI RARE GRAM POSITIVE RODS    CULT  03/15/2019    RARE PROTEUS MIRABILIS WITHIN MIXED ORGANISMS NO ANAEROBES ISOLATED Performed at [facility], [place] [address] [place],  [address]    LABORGA PROTEUS MIRABILIS 03/15/2019     Lab Results  Component Value Date   ALBUMIN 3.0 (L) 03/22/2019   ALBUMIN 2.8 (L) 03/21/2019   ALBUMIN 2.8 (L) 03/20/2019    No results found for: MG No results found for: VD25OH  No results found for: PREALBUMIN CBC EXTENDED Latest Ref Rng & Units 03/22/2019 03/21/2019 03/20/2019  WBC 4.0 - 10.5 K/uL 12.9(H) 11.0(H) 12.2(H)  RBC 4.22 - 5.81 MIL/uL 3.35(L) 3.37(L) 3.39(L)  HGB 13.0 - 17.0 g/dL 10.2(L) 10.3(L) 10.4(L)  HCT 39.0 - 52.0 % 31.8(L) 31.4(L) 32.4(L)  PLT 150 - 400 K/uL 316 294 335  NEUTROABS 1.7 - 7.7 K/uL - - -  LYMPHSABS 0.7 - 4.0 K/uL - - -     Body mass index is 36.34 kg/m.  Orders:  No orders of the defined types were placed in this encounter.  No orders of the defined types were placed in this encounter.    Procedures: No procedures performed  Clinical Data: No additional findings.  ROS:  All other systems negative, except as noted in the HPI. Review of Systems  Objective: Vital Signs: Ht 5\' 8"  (1.727 m)   Wt 239 lb (108.4 kg)   BMI 36.34 kg/m   Specialty Comments:  No specialty comments available.  PMFS History: Patient Active Problem List   Diagnosis Date Noted  .  Abscess of left thigh   . Abscess 03/14/2019  . Coronary atherosclerosis 10/04/2017  . GERD (gastroesophageal reflux disease) 10/04/2017  . Hypertension 10/04/2017  . Pulmonary embolism (HCC) 10/04/2017  . Transient visual loss 10/04/2017  . 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 Nonrheumatic aortic valve stenosis 03/31/2017  . Pericarditis, Acute 02/12/2017  . Acute on chronic right heart failure (HCC) 02/11/2017  . Angina at rest [facility]) 08/17/2016  . Unstable angina (HCC) 08/16/2016  . Tobacco abuse 08/16/2016  . Anti-phospholipid syndrome (HCC) 08/16/2016  . Hyperlipidemia 08/16/2016  . Essential hypertension, benign 08/16/2016  . COPD (chronic obstructive pulmonary disease) (HCC) 08/16/2016  . Sleep apnea 08/16/2016  . CAD S/P multiple  PCIs 08/16/2016  . History of pulmonary embolus (PE)-2011 08/16/2016  . Chronic anticoagulation-Couamdin 08/16/2016  . Non-insulin treated type 2 diabetes mellitus (HCC) 08/16/2016  . Carotid artery disease (HCC) 08/16/2016  . RBBB (right bundle branch block) 08/16/2016  . Dyspnea on exertion 10/17/2015  . ED (erectile dysfunction) 08/09/2012   Past Medical History:  Diagnosis Date  . Antiphospholipid antibody syndrome (HCC)   . CAD S/P percutaneous coronary angioplasty '98, '04, Feb 2017   [facility]  . Carotid arterial disease (HCC) 10/2015   moderate bilateral  . COPD (chronic obstructive pulmonary disease) (HCC)   . Coronary artery disease   . Hypertension   . Non-insulin treated type 2 diabetes mellitus (HCC)   . Pulmonary emboli (HCC) 2011  . RBBB   . Sleep apnea     Family History  Problem Relation Age of Onset  . CAD Father 20    Past Surgical History:  Procedure Laterality Date  . APPENDECTOMY    . APPLICATION OF WOUND VAC Left 03/18/2019   Procedure: Application Of Wound Vac;  Surgeon: Nadara Mustard, MD;  Location: [facility] OR;  Service: Orthopedics;  Laterality: Left;  . BACK SURGERY  '89, '06  . COLONOSCOPY    . CORONARY ANGIOPLASTY WITH STENT PLACEMENT  '98, '04, Feb 2017  . I & D EXTREMITY Left 03/15/2019   Procedure: DEBRIDEMENT LEFT THIGH;  Surgeon: Nadara Mustard, MD;  Location: [facility] OR;  Service: Orthopedics;  Laterality: Left;  . INCISION AND DRAINAGE OF WOUND Left 03/18/2019   Procedure: LEFT THIGH DEBRIDEMENT;  Surgeon: Nadara Mustard, MD;  Location: [facility] OR;  Service: Orthopedics;  Laterality: Left;   Social History   Occupational History  . Not on file  Tobacco Use  . Smoking status: Former Smoker    Packs/day: 0.50    Types: Cigarettes    Quit date: 01/15/2019    Years since quitting: 0.1  . Smokeless tobacco: Never Used  . Tobacco comment: has started smoking again, willing to quit.  Substance and Sexual Activity  . Alcohol use: No  . Drug use: No  .  Sexual activity: Yes

## 2019-03-28 NOTE — Telephone Encounter (Signed)
Patient has an appt scheduled for today in our office at 130 pm and we will discuss any concerns about labs upon arrival.

## 2019-04-04 ENCOUNTER — Encounter: Payer: Self-pay | Admitting: Orthopedic Surgery

## 2019-04-04 ENCOUNTER — Other Ambulatory Visit: Payer: Self-pay

## 2019-04-04 ENCOUNTER — Ambulatory Visit (INDEPENDENT_AMBULATORY_CARE_PROVIDER_SITE_OTHER): Payer: BC Managed Care – PPO | Admitting: Orthopedic Surgery

## 2019-04-04 VITALS — Ht 68.0 in | Wt 239.0 lb

## 2019-04-04 DIAGNOSIS — L02416 Cutaneous abscess of left lower limb: Secondary | ICD-10-CM

## 2019-04-04 NOTE — Progress Notes (Signed)
Office Visit Note   Patient: Jose [name]           Date of Birth: Dec 11, 1955           MRN: [id number] Visit Date: 04/04/2019              Requested by: [name], MD [address] [place],  [place] [address] PCP: [name], MD  Chief Complaint  Patient presents with  . Left Leg - Routine Post Op    03/18/19 left thigh debridement       HPI: Patient is a 64 year old gentleman who presents in follow-up status post debridement chronic abscess left thigh.  Patient states he is completing his course of Augmentin and doxycycline.   He has gotten his thigh-high medical compression stockings but has not yet begun wearing these.  Assessment & Plan: Visit Diagnoses:  1. Abscess of left thigh     Plan: Recommended he begin wearing the thigh-high compression garments. He will bring them to the next appointment. Continue with daily calcium cleansing. Either a dry dressing or may pad outside the thigh-high stockings.  Follow-Up Instructions: Return in about 1 week (around 04/11/2019).   Ortho Exam  Patient is alert, oriented, no adenopathy, well-dressed, normal affect, normal respiratory effort. The patient has massive pitting edema to the left lower extremity there is no erythema associated there is no weeping. Examination the surgical incision is well approximated. Greater than 75% healed. Proximally there is a 1 cm length that has not yet healed this is filled in with granulation tissue that is weeping. There is no cellulitis. Unable to palpate a seroma there is no area of fluctuance warmth Imaging: No results found. No images are attached to the encounter.  Labs: Lab Results  Component Value Date   HGBA1C 6.9 (H) 03/15/2019   HGBA1C 5.9 (H) 08/16/2016   REPTSTATUS 03/20/2019 FINAL 03/15/2019   GRAMSTAIN  03/15/2019    ABUNDANT WBC PRESENT, PREDOMINANTLY PMN FEW GRAM POSITIVE COCCI RARE GRAM POSITIVE RODS    CULT  03/15/2019    RARE PROTEUS  MIRABILIS WITHIN MIXED ORGANISMS NO ANAEROBES ISOLATED Performed at [facility], [address] [place], [place] [address]    Imelda Pillow PROTEUS MIRABILIS 03/15/2019     Lab Results  Component Value Date   ALBUMIN 3.0 (L) 03/22/2019   ALBUMIN 2.8 (L) 03/21/2019   ALBUMIN 2.8 (L) 03/20/2019    No results found for: MG No results found for: VD25OH  No results found for: PREALBUMIN CBC EXTENDED Latest Ref Rng & Units 03/22/2019 03/21/2019 03/20/2019  WBC 4.0 - 10.5 K/uL 12.9(H) 11.0(H) 12.2(H)  RBC 4.22 - 5.81 MIL/uL 3.35(L) 3.37(L) 3.39(L)  HGB 13.0 - 17.0 g/dL 10.2(L) 10.3(L) 10.4(L)  HCT 39.0 - 52.0 % 31.8(L) 31.4(L) 32.4(L)  PLT 150 - 400 K/uL 316 294 335  NEUTROABS 1.7 - 7.7 K/uL - - -  LYMPHSABS 0.7 - 4.0 K/uL - - -     Body mass index is 36.34 kg/m.  Orders:  No orders of the defined types were placed in this encounter.  No orders of the defined types were placed in this encounter.    Procedures: No procedures performed  Clinical Data: No additional findings.  ROS:  All other systems negative, except as noted in the HPI. Review of Systems  Constitutional: Negative for chills and fever.  Cardiovascular: Positive for leg swelling.  Skin: Negative for color change and wound.    Objective: Vital Signs: Ht 5'  8" (1.727 m)   Wt 239 lb (108.4 kg)   BMI 36.34 kg/m   Specialty Comments:  No specialty comments available.  PMFS History: Patient Active Problem List   Diagnosis Date Noted  . Abscess of left thigh   . Abscess 03/14/2019  . Coronary atherosclerosis 10/04/2017  . GERD (gastroesophageal reflux disease) 10/04/2017  . Hypertension 10/04/2017  . Pulmonary embolism ([place]) 10/04/2017  . Transient visual loss 10/04/2017  . 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 Nonrheumatic aortic valve stenosis 03/31/2017  . Pericarditis, Acute 02/12/2017  . Acute on chronic right heart failure ([place]) 02/11/2017  . Angina at rest [facility]) 08/17/2016  . Unstable angina ([place])  08/16/2016  . Tobacco abuse 08/16/2016  . Anti-phospholipid syndrome ([place]) 08/16/2016  . Hyperlipidemia 08/16/2016  . Essential hypertension, benign 08/16/2016  . COPD (chronic obstructive pulmonary disease) ([place]) 08/16/2016  . Sleep apnea 08/16/2016  . CAD S/P multiple PCIs 08/16/2016  . History of pulmonary embolus (PE)-2011 08/16/2016  . Chronic anticoagulation-Couamdin 08/16/2016  . Non-insulin treated type 2 diabetes mellitus ([place]) 08/16/2016  . Carotid artery disease ([place]) 08/16/2016  . RBBB (right bundle branch block) 08/16/2016  . Dyspnea on exertion 10/17/2015  . ED (erectile dysfunction) 08/09/2012   Past Medical History:  Diagnosis Date  . Antiphospholipid antibody syndrome ([place])   . CAD S/P percutaneous coronary angioplasty '98, '04, Feb 2017   [facility]  . Carotid arterial disease ([place]) 10/2015   moderate bilateral  . COPD (chronic obstructive pulmonary disease) ([place])   . Coronary artery disease   . Hypertension   . Non-insulin treated type 2 diabetes mellitus ([place])   . Pulmonary emboli ([place]) 2011  . RBBB   . Sleep apnea     Family History  Problem Relation Age of Onset  . CAD Father 59    Past Surgical History:  Procedure Laterality Date  . APPENDECTOMY    . APPLICATION OF WOUND VAC Left 03/18/2019   Procedure: Application Of Wound Vac;  Surgeon: Newt Minion, MD;  Location: [facility];  Service: Orthopedics;  Laterality: Left;  . BACK SURGERY  '89, '06  . COLONOSCOPY    . CORONARY ANGIOPLASTY WITH STENT PLACEMENT  '98, '04, Feb 2017  . I & D EXTREMITY Left 03/15/2019   Procedure: DEBRIDEMENT LEFT THIGH;  Surgeon: Newt Minion, MD;  Location: [facility];  Service: Orthopedics;  Laterality: Left;  . INCISION AND DRAINAGE OF WOUND Left 03/18/2019   Procedure: LEFT THIGH DEBRIDEMENT;  Surgeon: Newt Minion, MD;  Location: [facility];  Service: Orthopedics;  Laterality: Left;   Social History   Occupational History  . Not on file  Tobacco Use  . Smoking status: Former  Smoker    Packs/day: 0.50    Types: Cigarettes    Quit date: 01/15/2019    Years since quitting: 0.2  . Smokeless tobacco: Never Used  . Tobacco comment: has started smoking again, willing to quit.  Substance and Sexual Activity  . Alcohol use: No  . Drug use: No  . Sexual activity: Yes

## 2019-04-11 ENCOUNTER — Other Ambulatory Visit: Payer: Self-pay

## 2019-04-11 ENCOUNTER — Encounter: Payer: Self-pay | Admitting: Orthopedic Surgery

## 2019-04-11 ENCOUNTER — Ambulatory Visit (INDEPENDENT_AMBULATORY_CARE_PROVIDER_SITE_OTHER): Payer: BC Managed Care – PPO | Admitting: Orthopedic Surgery

## 2019-04-11 VITALS — Ht 68.0 in | Wt 239.0 lb

## 2019-04-11 DIAGNOSIS — L02416 Cutaneous abscess of left lower limb: Secondary | ICD-10-CM

## 2019-04-11 NOTE — Progress Notes (Signed)
Post-Op Visit Note   Patient: Jose [name]           Date of Birth: 1955/06/11           MRN: [id number] Visit Date: 04/11/2019 PCP: Elenore Paddy, MD  Chief Complaint:  Chief Complaint  Patient presents with  . Left Leg - Routine Post Op    03/18/19 left thigh debridement     HPI:  HPI The patient is a 64 year old gentleman who is seen today status post left thigh irrigation and debridement.  He has thigh-high compression garments with him to begin wearing after today.  He complains of a tender area just proximal to his incision this has been feeling firm and painful since surgery is mildly improved since last visit. Ortho Exam Patient has pitting edema up to the tibial tubercle there is no erythema or warmth. on examination of the left thigh incision this is healing well there are 2 areas that have not yet healed that are covered with fibrinous tissue a centimeter length or less.  There is no active drainage no surrounding erythema no odor there is no palpable abscess no area of fluctuance.  Visit Diagnoses:  1. Abscess of left thigh     Plan: Sutures harvested today without incident he will continue with daily Dial soap cleansing.  Wear his compression garments thigh-high around-the-clock.  Discussed using lotion or cream for moisturizing.  Follow-Up Instructions: Return in about 2 weeks (around 04/25/2019).   Imaging: No results found.  Orders:  No orders of the defined types were placed in this encounter.  No orders of the defined types were placed in this encounter.    PMFS History: Patient Active Problem List   Diagnosis Date Noted  . Abscess of left thigh   . Abscess 03/14/2019  . Coronary atherosclerosis 10/04/2017  . GERD (gastroesophageal reflux disease) 10/04/2017  . Hypertension 10/04/2017  . Pulmonary embolism ([place]) 10/04/2017  . Transient visual loss 10/04/2017  . 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 Nonrheumatic aortic valve stenosis 03/31/2017  .  Pericarditis, Acute 02/12/2017  . Acute on chronic right heart failure ([place]) 02/11/2017  . Angina at rest [facility]) 08/17/2016  . Unstable angina ([place]) 08/16/2016  . Tobacco abuse 08/16/2016  . 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 Hyperlipidemia 08/16/2016  . Essential hypertension, benign 08/16/2016  . COPD (chronic obstructive pulmonary disease) ([place]) 08/16/2016  . Sleep apnea 08/16/2016  . CAD S/P multiple PCIs 08/16/2016  . History of pulmonary embolus (PE)-2011 08/16/2016  . Chronic anticoagulation-[place] 08/16/2016  . Non-insulin treated type 2 diabetes mellitus ([place]) 08/16/2016  . Carotid artery disease ([place]) 08/16/2016  . RBBB (right bundle branch block) 08/16/2016  . Dyspnea on exertion 10/17/2015  . ED (erectile dysfunction) 08/09/2012   Past Medical History:  Diagnosis Date  . Antiphospholipid antibody syndrome ([place])   . CAD S/P percutaneous coronary angioplasty '98, '04, Feb 2017   [facility]  . Carotid arterial disease ([place]) 10/2015   moderate bilateral  . COPD (chronic obstructive pulmonary disease) ([place])   . Coronary artery disease   . Hypertension   . Non-insulin treated type 2 diabetes mellitus ([place])   . Pulmonary emboli ([place]) 2011  . RBBB   . Sleep apnea     Family History  Problem Relation Age of Onset  . CAD Father 5    Past Surgical History:  Procedure Laterality Date  . APPENDECTOMY    . APPLICATION OF WOUND VAC Left 03/18/2019   Procedure: Application Of  Wound Vac;  Surgeon: Nadara Mustard, MD;  Location[facility] OR;  Service: Orthopedics;  Laterality: Left;  . BACK SURGERY  '89, '06  . COLONOSCOPY    . CORONARY ANGIOPLASTY WITH STENT PLACEMENT  '98, '04, Feb 2017  . I & D EXTREMITY Left 03/15/2019   Procedure: DEBRIDEMENT LEFT THIGH;  Surgeon: Nadara Mustard, MD;  Location: [facility] OR;  Service: Orthopedics;  Laterality: Left;  . INCISION AND DRAINAGE OF WOUND Left 03/18/2019   Procedure: LEFT THIGH DEBRIDEMENT;  Surgeon: Nadara Mustard, MD;  Location: [facility]  OR;  Service: Orthopedics;  Laterality: Left;   Social History   Occupational History  . Not on file  Tobacco Use  . Smoking status: Former Smoker    Packs/day: 0.50    Types: Cigarettes    Quit date: 01/15/2019    Years since quitting: 0.2  . Smokeless tobacco: Never Used  . Tobacco comment: has started smoking again, willing to quit.  Substance and Sexual Activity  . Alcohol use: No  . Drug use: No  . Sexual activity: Yes

## 2019-04-25 ENCOUNTER — Ambulatory Visit: Payer: BC Managed Care – PPO | Admitting: Orthopedic Surgery

## 2019-05-09 ENCOUNTER — Ambulatory Visit: Payer: BC Managed Care – PPO | Admitting: Orthopedic Surgery

## 2019-05-18 ENCOUNTER — Ambulatory Visit: Payer: BC Managed Care – PPO | Admitting: Orthopedic Surgery

## 2020-01-08 ENCOUNTER — Encounter: Payer: BC Managed Care – PPO | Attending: Internal Medicine

## 2020-01-08 ENCOUNTER — Other Ambulatory Visit: Payer: Self-pay

## 2020-01-08 DIAGNOSIS — J449 Chronic obstructive pulmonary disease, unspecified: Secondary | ICD-10-CM

## 2020-01-08 DIAGNOSIS — F17210 Nicotine dependence, cigarettes, uncomplicated: Secondary | ICD-10-CM | POA: Insufficient documentation

## 2020-01-08 NOTE — Progress Notes (Signed)
Virtual Visit completed. Patient informed on EP and RD appointment and 6 Minute walk test. Patient also informed of patient health questionnaires on My Chart. Patient Verbalizes understanding. Visit diagnosis can be found in [facility] 10/24/2019.

## 2020-01-09 ENCOUNTER — Encounter: Payer: BC Managed Care – PPO | Admitting: *Deleted

## 2020-01-09 VITALS — Ht 67.0 in | Wt 266.1 lb

## 2020-01-09 DIAGNOSIS — F17210 Nicotine dependence, cigarettes, uncomplicated: Secondary | ICD-10-CM | POA: Diagnosis not present

## 2020-01-09 DIAGNOSIS — J449 Chronic obstructive pulmonary disease, unspecified: Secondary | ICD-10-CM

## 2020-01-09 NOTE — Progress Notes (Signed)
Pulmonary Individual Treatment Plan  Patient Details  Name: Jose [name] MRN: [id number] Date of Birth: 01/29/1956 Referring Provider:     Pulmonary Rehab from 01/09/2020 in [facility]  Referring Provider Bedoya, Armando MD      Initial Encounter Date:    Pulmonary Rehab from 01/09/2020 in [facility]  Date 01/09/20      Visit Diagnosis: Chronic obstructive pulmonary disease, unspecified COPD type (HCC)  Patient's Home Medications on Admission:  Current Outpatient Medications:  .  albuterol (PROVENTIL) (2.5 MG/3ML) 0.083% nebulizer solution, PLEASE SEE ATTACHED FOR DETAILED DIRECTIONS, Disp: , Rfl:  .  albuterol (PROVENTIL) (5 MG/ML) 0.5% nebulizer solution, Take 0.5 mLs (2.5 mg total) by nebulization every 6 (six) hours as needed for wheezing or shortness of breath., Disp: 20 mL, Rfl: 12 .  aspirin EC 81 MG tablet, Take 81 mg by mouth daily. (Patient not taking: Reported on 01/08/2020), Disp: , Rfl:  .  Blood Glucose Monitoring Suppl (CONTOUR NEXT MONITOR) w/Device KIT, by Does not apply route., Disp: , Rfl:  .  Blood Glucose Monitoring Suppl (GLUCOCOM BLOOD GLUCOSE MONITOR) DEVI, 1 each by XX route as directed (For Blood Glucose Monitoring), Disp: , Rfl:  .  doxycycline (VIBRA-TABS) 100 MG tablet, Take 1 tablet (100 mg total) by mouth every 12 (twelve) hours., Disp: 14 tablet, Rfl: 0 .  DULoxetine (CYMBALTA) 60 MG capsule, Take by mouth., Disp: , Rfl:  .  enoxaparin (LOVENOX) 120 MG/0.8ML injection, Inject 0.73 mLs (110 mg total) into the skin every 12 (twelve) hours for 7 days., Disp: 10.22 mL, Rfl: 0 .  Fluticasone Propionate, Inhal, 100 MCG/BLIST AEPB, Inhale 1 puff into the lungs 2 (two) times daily., Disp: , Rfl:  .  gabapentin (NEURONTIN) 300 MG capsule, Take 600 mg by mouth 3 (three) times daily. , Disp: , Rfl:  .  glucose blood (CONTOUR NEXT TEST) test strip, Use to check blood sugar twice 2 time(s) daily., Disp: , Rfl:  .   HYDROcodone-acetaminophen (NORCO) 10-325 MG tablet, Take 1 tablet by mouth 2 (two) times daily. (Patient not taking: Reported on 01/08/2020), Disp: , Rfl:  .  ipratropium-albuterol (DUONEB) 0.5-2.5 (3) MG/3ML SOLN, Inhale into the lungs., Disp: , Rfl:  .  Lancets (ACCU-CHEK SOFT TOUCH) lancets, 2 (two) times daily., Disp: , Rfl:  .  metoprolol tartrate (LOPRESSOR) 12.5 mg TABS tablet, Take 12.5 mg by mouth 2 (two) times daily. , Disp: , Rfl:  .  nicotine (NICODERM CQ - DOSED IN MG/24 HOURS) 14 mg/24hr patch, Apply one patch daily x 2 weeks.  Begin after completion of 21 mg patches, Disp: , Rfl:  .  nicotine (NICODERM CQ - DOSED IN MG/24 HOURS) 21 mg/24hr patch, 1 patch daily., Disp: , Rfl:  .  nicotine (NICODERM CQ - DOSED IN MG/24 HR) 7 mg/24hr patch, Apply one patch daily x 2 weeks.  Begin after completion of 14 mg patches, Disp: , Rfl:  .  nicotine polacrilex (NICORETTE) 4 MG gum, Take by mouth., Disp: , Rfl:  .  nitroGLYCERIN (NITROSTAT) 0.4 MG SL tablet, Place 0.4 mg under the tongue every 5 (five) minutes as needed for chest pain. , Disp: , Rfl: 1 .  pantoprazole (PROTONIX) 40 MG tablet, Take 40 mg by mouth 2 (two) times daily., Disp: , Rfl: 3 .  PROAIR HFA 108 (90 Base) MCG/ACT inhaler, Inhale 1-2 puffs into the lungs every 4 (four) hours as needed for wheezing. , Disp: , Rfl: 12 .    rosuvastatin (CRESTOR) 10 MG tablet, Take 10 mg by mouth every evening., Disp: , Rfl: 11 .  Semaglutide, 1 MG/DOSE, (OZEMPIC, 1 MG/DOSE,) 4 MG/3ML SOPN, INJECT 1 MILLIGRAM SUBCUTANEOUSLY ONCE A WEEK, Disp: , Rfl:  .  tamsulosin (FLOMAX) 0.4 MG CAPS capsule, Take 0.4 mg by mouth daily., Disp: , Rfl: 0 .  Tiotropium Bromide-Olodaterol (STIOLTO RESPIMAT) 2.5-2.5 MCG/ACT AERS, Inhale 2 puffs into the lungs daily. , Disp: , Rfl:  .  tiZANidine (ZANAFLEX) 4 MG tablet, Take 4 mg by mouth 3 (three) times daily., Disp: , Rfl:  .  topiramate (TOPAMAX) 50 MG tablet, Take 50 mg by mouth 2 (two) times daily., Disp: , Rfl: 0 .   torsemide (DEMADEX) 20 MG tablet, Take 40 mg by mouth daily., Disp: , Rfl:  .  warfarin (COUMADIN) 5 MG tablet, Take 5-6 mg by mouth daily. 5 mg daily, except 6 mg on Mondays and Fridays, Disp: , Rfl: 0  Past Medical History: Past Medical History:  Diagnosis Date  . Antiphospholipid antibody syndrome ([place])   . CAD S/P percutaneous coronary angioplasty '98, '04, Feb 2017   [facility]  . Carotid arterial disease ([place]) 10/2015   moderate bilateral  . COPD (chronic obstructive pulmonary disease) ([place])   . Coronary artery disease   . Hypertension   . Non-insulin treated type 2 diabetes mellitus ([place])   . Pulmonary emboli ([place]) 2011  . RBBB   . Sleep apnea     Tobacco Use: Social History   Tobacco Use  Smoking Status Current Every Day Smoker  . Packs/day: 0.50  . Years: 40.00  . Pack years: 20.00  . Types: Cigarettes  . Last attempt to quit: 01/15/2019  . Years since quitting: 0.9  Smokeless Tobacco Never Used  Tobacco Comment   has started smoking again, willing to quit.    Labs: Recent Review Flowsheet Data    Labs for [facility]P Cardiac and Pulmonary Rehab Latest Ref Rng & Units 08/16/2016 12/27/2018 03/15/2019   Cholestrol 0 - 200 mg/dL 156 - -   LDLCALC 0 - 99 mg/dL 64 - -   HDL >40 mg/dL 28(L) - -   Trlycerides <150 mg/dL 322(H) - -   Hemoglobin A1c 4.8 - 5.6 % 5.9(H) - 6.9(H)   HCO3 20.0 - 28.0 mmol/L - 31.8(H) -   TCO2 22 - 32 mmol/L - 34(H) -   O2SAT % - 95.0 -       Pulmonary Assessment Scores:  Pulmonary Assessment Scores    Row Name 01/09/20 1535         ADL UCSD   ADL Phase Entry     SOB Score total 114     Rest 1     Walk 4     Stairs 5     Bath 5     Dress 5     Shop 5       CAT Score   CAT Score 28       mMRC Score   mMRC Score 4            UCSD: Self-administered rating of dyspnea associated with activities of daily living (ADLs) 6-point scale (0 = "not at all" to 5 = "maximal or unable to do because of breathlessness")  Scoring Scores range  from 0 to 120.  Minimally important difference is 5 units  CAT: CAT can identify the health impairment of COPD patients and is better correlated with disease progression.  CAT has a scoring range of  zero to 40. The CAT score is classified into four groups of low (less than 10), medium (10 - 20), high (21-30) and very high (31-40) based on the impact level of disease on health status. A CAT score over 10 suggests significant symptoms.  A worsening CAT score could be explained by an exacerbation, poor medication adherence, poor inhaler technique, or progression of COPD or comorbid conditions.  CAT MCID is 2 points  mMRC: mMRC (Modified Medical Research Council) Dyspnea Scale is used to assess the degree of baseline functional disability in patients of respiratory disease due to dyspnea. No minimal important difference is established. A decrease in score of 1 point or greater is considered a positive change.   Pulmonary Function Assessment:  Pulmonary Function Assessment - 01/08/20 1347      Breath   Shortness of Breath Limiting activity;Yes           Exercise Target Goals: Exercise Program Goal: Individual exercise prescription set using results from initial 6 min walk test and THRR while considering  patient's activity barriers and safety.   Exercise Prescription Goal: Initial exercise prescription builds to 30-45 minutes a day of aerobic activity, 2-3 days per week.  Home exercise guidelines will be given to patient during program as part of exercise prescription that the participant will acknowledge.  Education: Aerobic Exercise & Resistance Training: - Gives group verbal and written instruction on the various components of exercise. Focuses on aerobic and resistive training programs and the benefits of this training and how to safely progress through these programs..   Cardiac Rehab from 02/16/2018 in [facility]  Date 10/18/17  Educator KH  Instruction Review  Code 1- Verbalizes Understanding      Education: Exercise & Equipment Safety: - Individual verbal instruction and demonstration of equipment use and safety with use of the equipment.   Pulmonary Rehab from 01/09/2020 in [facility]  Date 01/08/20  Educator JH  Instruction Review Code 1- Verbalizes Understanding      Education: Exercise Physiology & General Exercise Guidelines: - Group verbal and written instruction with models to review the exercise physiology of the cardiovascular system and associated critical values. Provides general exercise guidelines with specific guidelines to those with heart or lung disease.    Pulmonary Rehab from 01/09/2020 in [facility]  Education need identified 01/09/20      Education: Flexibility, Balance, Mind/Body Relaxation: Provides group verbal/written instruction on the benefits of flexibility and balance training, including mind/body exercise modes such as yoga, pilates and tai chi.  Demonstration and skill practice provided.   Cardiac Rehab from 02/16/2018 in [facility]  Date 02/07/18  Educator AS  Instruction Review Code 1- Verbalizes Understanding      Activity Barriers & Risk Stratification:  Activity Barriers & Cardiac Risk Stratification - 01/09/20 1529      Activity Barriers & Cardiac Risk Stratification   Activity Barriers Arthritis;Back Problems;Neck/Spine Problems;Muscular Weakness;Deconditioning;Balance Concerns;Shortness of Breath;Decreased Ventricular Function           6 Minute Walk:  6 Minute Walk    Row Name 01/09/20 1527         6 Minute Walk   Phase Initial     Distance 385 feet     Walk Time 5.02 minutes     # of Rest Breaks 3  9 sec, 12 sec, 8 sec, stopped at 5:30     MPH 0.87       METS 1     RPE 19     Perceived Dyspnea  4     VO2 Peak 3.52     Symptoms Yes (comment)     Comments SOB, back/leg spasms 8-10/10     Resting HR 75 bpm      Resting BP 148/74     Resting Oxygen Saturation  94 %     Exercise Oxygen Saturation  during 6 min walk 88 %     Max Ex. HR 96 bpm     Max Ex. BP 164/70     2 Minute Post BP 156/74       Interval HR   1 Minute HR 93     2 Minute HR 92     3 Minute HR 96     4 Minute HR 93     5 Minute HR 94     6 Minute HR 93     2 Minute Post HR 90     Interval Heart Rate? Yes       Interval Oxygen   Interval Oxygen? Yes     Baseline Oxygen Saturation % 94 %     1 Minute Oxygen Saturation % 90 %     1 Minute Liters of Oxygen 0 L  Room Air     2 Minute Oxygen Saturation % 91 %     2 Minute Liters of Oxygen 0 L     3 Minute Oxygen Saturation % 90 %     3 Minute Liters of Oxygen 0 L     4 Minute Oxygen Saturation % 91 %     4 Minute Liters of Oxygen 0 L     5 Minute Oxygen Saturation % 92 %     5 Minute Liters of Oxygen 0 L     6 Minute Oxygen Saturation % 88 %     6 Minute Liters of Oxygen 0 L     2 Minute Post Oxygen Saturation % 93 %     2 Minute Post Liters of Oxygen 0 L           Oxygen Initial Assessment:  Oxygen Initial Assessment - 01/08/20 1343      Home Oxygen   Home Oxygen Device Home Concentrator    Sleep Oxygen Prescription BiPAP   Does not wear since it is uncomfortable and wants a CPAP instead.   Liters per minute 3    Home Exercise Oxygen Prescription None    Home Resting Oxygen Prescription None    Compliance with Home Oxygen Use No      Initial 6 min Walk   Oxygen Used None      Program Oxygen Prescription   Program Oxygen Prescription None      Intervention   Short Term Goals To learn and exhibit compliance with exercise, home and travel O2 prescription;To learn and understand importance of monitoring SPO2 with pulse oximeter and demonstrate accurate use of the pulse oximeter.;To learn and demonstrate proper pursed lip breathing techniques or other breathing techniques.;To learn and demonstrate proper use of respiratory medications;To learn and understand  importance of maintaining oxygen saturations>88%    Long  Term Goals Exhibits compliance with exercise, home and travel O2 prescription;Maintenance of O2 saturations>88%;Compliance with respiratory medication;Demonstrates proper use of MDI's;Exhibits proper breathing techniques, such as pursed lip breathing or other method taught during program session;Verbalizes importance of monitoring SPO2 with pulse oximeter and return demonstration             Oxygen Re-Evaluation:   Oxygen Discharge (Final Oxygen Re-Evaluation):   Initial Exercise Prescription:  Initial Exercise Prescription - 01/09/20 1500      Date of Initial Exercise RX and Referring Provider   Date 01/09/20    Referring Provider Hulan Fray MD      Treadmill   MPH 0.7    Grade 0    Minutes 15    METs 1.5      NuStep   Level 1    SPM 80    Minutes 15    METs 1.5      T5 Nustep   Level 1    SPM 80    Minutes 15    METs 1.5      Biostep-RELP   Level 1    SPM 50    Minutes 15    METs 1      Prescription Details   Frequency (times per week) 3    Duration Progress to 30 minutes of continuous aerobic without signs/symptoms of physical distress      Intensity   THRR 40-80% of Max Heartrate 107-140    Ratings of Perceived Exertion 11-13    Perceived Dyspnea 0-4      Progression   Progression Continue to progress workloads to maintain intensity without signs/symptoms of physical distress.      Resistance Training   Training Prescription Yes    Weight 3 lbs    Reps 10-15           Perform Capillary Blood Glucose checks as needed.  Exercise Prescription Changes:   Exercise Comments:   Exercise Goals and Review:  Exercise Goals    Row Name 01/09/20 1531             Exercise Goals   Increase Physical Activity Yes       Intervention Provide advice, education, support and counseling about physical activity/exercise needs.;Develop an individualized exercise prescription for aerobic and  resistive training based on initial evaluation findings, risk stratification, comorbidities and participant's personal goals.       Expected Outcomes Short Term: Attend rehab on a regular basis to increase amount of physical activity.;Long Term: Add in home exercise to make exercise part of routine and to increase amount of physical activity.;Long Term: Exercising regularly at least 3-5 days a week.       Increase Strength and Stamina Yes       Intervention Provide advice, education, support and counseling about physical activity/exercise needs.;Develop an individualized exercise prescription for aerobic and resistive training based on initial evaluation findings, risk stratification, comorbidities and participant's personal goals.       Expected Outcomes Short Term: Increase workloads from initial exercise prescription for resistance, speed, and METs.;Short Term: Perform resistance training exercises routinely during rehab and add in resistance training at home;Long Term: Improve cardiorespiratory fitness, muscular endurance and strength as measured by increased METs and functional capacity (6MWT)       Able to understand and use rate of perceived exertion (RPE) scale Yes       Intervention Provide education and explanation on how to use RPE scale       Expected Outcomes Short Term: Able to use RPE daily in rehab to express subjective intensity level;Long Term:  Able to use RPE to guide intensity level when exercising independently       Able to understand and use Dyspnea scale Yes       Intervention Provide education and explanation on how to use Dyspnea  scale       Expected Outcomes Short Term: Able to use Dyspnea scale daily in rehab to express subjective sense of shortness of breath during exertion;Long Term: Able to use Dyspnea scale to guide intensity level when exercising independently       Knowledge and understanding of Target Heart Rate Range (THRR) Yes       Intervention Provide education and  explanation of THRR including how the numbers were predicted and where they are located for reference       Expected Outcomes Short Term: Able to state/look up THRR;Short Term: Able to use daily as guideline for intensity in rehab;Long Term: Able to use THRR to govern intensity when exercising independently       Able to check pulse independently Yes       Intervention Provide education and demonstration on how to check pulse in carotid and radial arteries.;Review the importance of being able to check your own pulse for safety during independent exercise       Expected Outcomes Short Term: Able to explain why pulse checking is important during independent exercise;Long Term: Able to check pulse independently and accurately       Understanding of Exercise Prescription Yes       Intervention Provide education, explanation, and written materials on patient's individual exercise prescription       Expected Outcomes Short Term: Able to explain program exercise prescription;Long Term: Able to explain home exercise prescription to exercise independently              Exercise Goals Re-Evaluation :   Discharge Exercise Prescription (Final Exercise Prescription Changes):   Nutrition:  Target Goals: Understanding of nutrition guidelines, daily intake of sodium <1535m, cholesterol <2027m calories 30% from fat and 7% or less from saturated fats, daily to have 5 or more servings of fruits and vegetables.  Education: Controlling Sodium/Reading Food Labels -Group verbal and written material supporting the discussion of sodium use in heart healthy nutrition. Review and explanation with models, verbal and written materials for utilization of the food label.   Education: General Nutrition Guidelines/Fats and Fiber: -Group instruction provided by verbal, written material, models and posters to present the general guidelines for heart healthy nutrition. Gives an explanation and review of dietary fats and  fiber.   Biometrics:  Pre Biometrics - 01/09/20 1533      Pre Biometrics   Height 5' 7" (1.702 m)    Weight 266 lb 1.6 oz (120.7 kg)    BMI (Calculated) 41.67    Single Leg Stand 0.9 seconds            Nutrition Therapy Plan and Nutrition Goals:   Nutrition Assessments:  Nutrition Assessments - 01/09/20 1533      MEDFICTS Scores   Pre Score 102           MEDIFICTS Score Key:          ?70 Need to make dietary changes          40-70 Heart Healthy Diet         ? 40 Therapeutic Level Cholesterol Diet  Nutrition Goals Re-Evaluation:   Nutrition Goals Discharge (Final Nutrition Goals Re-Evaluation):   Psychosocial: Target Goals: Acknowledge presence or absence of significant depression and/or stress, maximize coping skills, provide positive support system. Participant is able to verbalize types and ability to use techniques and skills needed for reducing stress and depression.   Education: Depression - Provides group verbal and written instruction on the  correlation between heart/lung disease and depressed mood, treatment options, and the stigmas associated with seeking treatment.   Education: Sleep Hygiene -Provides group verbal and written instruction about how sleep can affect your health.  Define sleep hygiene, discuss sleep cycles and impact of sleep habits. Review good sleep hygiene tips.    Cardiac Rehab from 02/16/2018 in [facility]  Date 10/06/17  Educator Lucianne Lei, MSW  Instruction Review Code 1- Verbalizes Understanding      Education: Stress and Anxiety: - Provides group verbal and written instruction about the health risks of elevated stress and causes of high stress.  Discuss the correlation between heart/lung disease and anxiety and treatment options. Review healthy ways to manage with stress and anxiety.   Initial Review & Psychosocial Screening:  Initial Psych Review & Screening - 01/08/20 1348      Initial Review    Current issues with Current Stress Concerns    Source of Stress Concerns Chronic Illness    Comments His breathing has been tough for him and makes it heard for him to do his daily activities. He wants to be able to move more and breath easier so he can live life better.      Family Dynamics   Good Support System? Yes    Comments He can look to his wife for support. His daughter and son in law just moved into his sub division.      Barriers   Psychosocial barriers to participate in program The patient should benefit from training in stress management and relaxation.      Screening Interventions   Interventions To provide support and resources with identified psychosocial needs;Provide feedback about the scores to participant;Encouraged to exercise    Expected Outcomes Short Term goal: Utilizing psychosocial counselor, staff and physician to assist with identification of specific Stressors or current issues interfering with healing process. Setting desired goal for each stressor or current issue identified.;Long Term Goal: Stressors or current issues are controlled or eliminated.;Short Term goal: Identification and review with participant of any Quality of Life or Depression concerns found by scoring the questionnaire.;Long Term goal: The participant improves quality of Life and PHQ9 Scores as seen by post scores and/or verbalization of changes           Quality of Life Scores:  Scores of 19 and below usually indicate a poorer quality of life in these areas.  A difference of  2-3 points is a clinically meaningful difference.  A difference of 2-3 points in the total score of the Quality of Life Index has been associated with significant improvement in overall quality of life, self-image, physical symptoms, and general health in studies assessing change in quality of life.  PHQ-9: Recent Review Flowsheet Data    Depression screen [facility] 2/9 01/09/2020 02/03/2018 10/04/2017   Decreased Interest _0 Down, Depressed, Hopeless _1 PHQ - 2 Score _2 Altered sleeping _3 Tired, decreased energy _4 Change in appetite _5 Feeling bad or failure about yourself  _6 Trouble concentrating 1 0 0   Moving slowly or fidgety/restless 0 0 0   Suicidal thoughts 1  0 0   PHQ-9 Score _7 Difficult doing work/chores Somewhat difficult Somewhat difficult Somewhat difficult     Interpretation of Total Score  Total Score Depression Severity:  1-4 = Minimal depression, 5-9 = Mild depression, 10-14 = Moderate depression, 15-19 = Moderately severe depression, 20-27 = Severe depression   Psychosocial Evaluation and Intervention:  Psychosocial Evaluation - 01/08/20 1350      Psychosocial Evaluation & Interventions   Interventions Encouraged to exercise with the program and follow exercise prescription;Relaxation education;Stress management education    Comments His breathing has been tough for him and makes it heard for him to do his daily activities. He wants to be able to move more and breath easier so he can live life better.    Expected Outcomes Short: Exercise regularly to support mental health and notify staff of any changes. Long: maintain mental health and well being through teaching of rehab or prescribed medications independently.    Continue Psychosocial Services  Follow up required by staff           Psychosocial Re-Evaluation:   Psychosocial Discharge (Final Psychosocial Re-Evaluation):   Education: Education Goals: Education classes will be provided on a weekly basis, covering required topics. Participant will state understanding/return demonstration of topics presented.  Learning Barriers/Preferences:  Learning Barriers/Preferences - 01/08/20 1348      Learning Barriers/Preferences   Learning Barriers None    Learning Preferences None           General Pulmonary Education Topics:  Infection Prevention: - Provides verbal and written  material to individual with discussion of infection control including proper hand washing and proper equipment cleaning during exercise session.   Pulmonary Rehab from 01/09/2020 in [facility]  Date 01/08/20  Educator JH  Instruction Review Code 1- Verbalizes Understanding      Falls Prevention: - Provides verbal and written material to individual with discussion of falls prevention and safety.   Pulmonary Rehab from 01/09/2020 in [facility]  Date 01/08/20  Educator JH  Instruction Review Code 1- Verbalizes Understanding      Chronic Lung Diseases: - Group verbal and written instruction to review updates, respiratory medications, advancements in procedures and treatments. Discuss use of supplemental oxygen including available portable oxygen systems, continuous and intermittent flow rates, concentrators, personal use and safety guidelines. Review proper use of inhaler and spacers. Provide informative websites for self-education.    Pulmonary Rehab from 01/09/2020 in [facility]  Education need identified 01/09/20      Energy Conservation: - Provide group verbal and written instruction for methods to conserve energy, plan and organize activities. Instruct on pacing techniques, use of adaptive equipment and posture/positioning to relieve shortness of breath.   Triggers and Exacerbations: - Group verbal and written instruction to review types of environmental triggers and ways to prevent exacerbations. Discuss weather changes, air quality and the benefits of nasal washing. Review warning signs and symptoms to help prevent infections. Discuss techniques for effective airway clearance, coughing, and vibrations.   AED/CPR: - Group verbal and written instruction with the use of models to demonstrate the basic use of the AED with the basic ABC's of resuscitation.   Anatomy and Physiology of the Lungs: - Group verbal and  written instruction with the use of models to provide basic lung anatomy and physiology related to function, structure and complications of lung disease.   Anatomy & Physiology of the Heart: - Group verbal and written instruction and models provide basic cardiac anatomy and physiology, with the coronary electrical and arterial systems. Review of Valvular disease and Heart Failure   Cardiac Medications: - Group verbal and written   instruction to review commonly prescribed medications for heart disease. Reviews the medication, class of the drug, and side effects.   Other: -Provides group and verbal instruction on various topics (see comments)   Knowledge Questionnaire Score:  Knowledge Questionnaire Score - 01/09/20 1534      Knowledge Questionnaire Score   Pre Score 15/18 educaiton focus: exercise, O2 safety, PLB            Core Components/Risk Factors/Patient Goals at Admission:  Personal Goals and Risk Factors at Admission - 01/09/20 1534      Core Components/Risk Factors/Patient Goals on Admission    Weight Management Yes;Obesity;Weight Loss    Intervention Weight Management: Develop a combined nutrition and exercise program designed to reach desired caloric intake, while maintaining appropriate intake of nutrient and fiber, sodium and fats, and appropriate energy expenditure required for the weight goal.;Weight Management: Provide education and appropriate resources to help participant work on and attain dietary goals.;Weight Management/Obesity: Establish reasonable short term and long term weight goals.;Obesity: Provide education and appropriate resources to help participant work on and attain dietary goals.    Admit Weight 266 lb 1.6 oz (120.7 kg)    Goal Weight: Short Term 260 lb (117.9 kg)    Goal Weight: Long Term 250 lb (113.4 kg)    Expected Outcomes Short Term: Continue to assess and modify interventions until short term weight is achieved;Long Term: Adherence to nutrition  and physical activity/exercise program aimed toward attainment of established weight goal;Weight Loss: Understanding of general recommendations for a balanced deficit meal plan, which promotes 1-2 lb weight loss per week and includes a negative energy balance of 500-1000 kcal/d;Understanding recommendations for meals to include 15-35% energy as protein, 25-35% energy from fat, 35-60% energy from carbohydrates, less than 200mg of dietary cholesterol, 20-35 gm of total fiber daily;Understanding of distribution of calorie intake throughout the day with the consumption of 4-5 meals/snacks    Tobacco Cessation Yes    Number of packs per day 1/2    Intervention Assist the participant in steps to quit. Provide individualized education and counseling about committing to Tobacco Cessation, relapse prevention, and pharmacological support that can be provided by physician.;Offer self-teaching materials, assist with locating and accessing local/national Quit Smoking programs, and support quit date choice.    Expected Outcomes Short Term: Will demonstrate readiness to quit, by selecting a quit date.;Short Term: Will quit all tobacco product use, adhering to prevention of relapse plan.;Long Term: Complete abstinence from all tobacco products for at least 12 months from quit date.    Improve shortness of breath with ADL's Yes    Intervention Provide education, individualized exercise plan and daily activity instruction to help decrease symptoms of SOB with activities of daily living.    Expected Outcomes Short Term: Improve cardiorespiratory fitness to achieve a reduction of symptoms when performing ADLs;Long Term: Be able to perform more ADLs without symptoms or delay the onset of symptoms    Diabetes Yes    Intervention Provide education about signs/symptoms and action to take for hypo/hyperglycemia.;Provide education about proper nutrition, including hydration, and aerobic/resistive exercise prescription along with  prescribed medications to achieve blood glucose in normal ranges: Fasting glucose 65-99 mg/dL    Expected Outcomes Short Term: Participant verbalizes understanding of the signs/symptoms and immediate care of hyper/hypoglycemia, proper foot care and importance of medication, aerobic/resistive exercise and nutrition plan for blood glucose control.;Long Term: Attainment of HbA1C < 7%.    Heart Failure Yes    Intervention Provide a combined   exercise and nutrition program that is supplemented with education, support and counseling about heart failure. Directed toward relieving symptoms such as shortness of breath, decreased exercise tolerance, and extremity edema.    Expected Outcomes Improve functional capacity of life;Short term: Attendance in program 2-3 days a week with increased exercise capacity. Reported lower sodium intake. Reported increased fruit and vegetable intake. Reports medication compliance.;Short term: Daily weights obtained and reported for increase. Utilizing diuretic protocols set by physician.;Long term: Adoption of self-care skills and reduction of barriers for early signs and symptoms recognition and intervention leading to self-care maintenance.    Hypertension Yes    Intervention Provide education on lifestyle modifcations including regular physical activity/exercise, weight management, moderate sodium restriction and increased consumption of fresh fruit, vegetables, and low fat dairy, alcohol moderation, and smoking cessation.;Monitor prescription use compliance.    Expected Outcomes Short Term: Continued assessment and intervention until BP is < 140/90mm HG in hypertensive participants. < 130/80mm HG in hypertensive participants with diabetes, heart failure or chronic kidney disease.;Long Term: Maintenance of blood pressure at goal levels.    Lipids Yes    Intervention Provide education and support for participant on nutrition & aerobic/resistive exercise along with prescribed  medications to achieve LDL <70mg, HDL >40mg.    Expected Outcomes Short Term: Participant states understanding of desired cholesterol values and is compliant with medications prescribed. Participant is following exercise prescription and nutrition guidelines.;Long Term: Cholesterol controlled with medications as prescribed, with individualized exercise RX and with personalized nutrition plan. Value goals: LDL < 70mg, HDL > 40 mg.           Education:Diabetes - Individual verbal and written instruction to review signs/symptoms of diabetes, desired ranges of glucose level fasting, after meals and with exercise. Acknowledge that pre and post exercise glucose checks will be done for 3 sessions at entry of program.   Pulmonary Rehab from 01/09/2020 in [facility]  Date 01/08/20  Educator JH  Instruction Review Code 1- Verbalizes Understanding      Education: Know Your Numbers and Risk Factors: -Group verbal and written instruction about important numbers in your health.  Discussion of what are risk factors and how they play a role in the disease process.  Review of Cholesterol, Blood Pressure, Diabetes, and BMI and the role they play in your overall health.   Cardiac Rehab from 02/16/2018 in [facility]  Date 02/14/18  Educator CE  Instruction Review Code 1- Verbalizes Understanding      Core Components/Risk Factors/Patient Goals Review:   Goals and Risk Factor Review    Row Name 01/09/20 1532             Core Components/Risk Factors/Patient Goals Review   Personal Goals Review Tobacco Cessation       Review Armarion is a current tobacco user. Intervention for tobacco cessation was provided at the initial medical review. He was asked about readiness to quit and reported that he is ready to quit again . Patient was advised and educated about tobacco cessation using combination therapy, tobacco cessation classes, quit line, and quit smoking apps. He  already has patches and is ready to start to use them.  He declined handouts. Patient demonstrated understanding of this material. Staff will continue to provide encouragement and follow up with the patient throughout the program.       Expected Outcomes Short: Start using patches Long: Set quit date                Core Components/Risk Factors/Patient Goals at Discharge (Final Review):   Goals and Risk Factor Review - 01/09/20 1532      Core Components/Risk Factors/Patient Goals Review   Personal Goals Review Tobacco Cessation    Review Lauri is a current tobacco user. Intervention for tobacco cessation was provided at the initial medical review. He was asked about readiness to quit and reported that he is ready to quit again . Patient was advised and educated about tobacco cessation using combination therapy, tobacco cessation classes, quit line, and quit smoking apps. He already has patches and is ready to start to use them.  He declined handouts. Patient demonstrated understanding of this material. Staff will continue to provide encouragement and follow up with the patient throughout the program.    Expected Outcomes Short: Start using patches Long: Set quit date           ITP Comments:  ITP Comments    Row Name 01/08/20 1419 01/09/20 1527         ITP Comments Virtual Visit completed. Patient informed on EP and RD appointment and 6 Minute walk test. Patient also informed of patient health questionnaires on My Chart. Patient Verbalizes understanding. Visit diagnosis can be found in [facility] 10/24/2019. Completed 6MWT and gym orientation. Initial ITP created and sent for review to Dr. Emily Filbert, Medical Director.             Comments: Initial ITP

## 2020-01-09 NOTE — Patient Instructions (Signed)
Patient Instructions  Patient Details  Name: Jose [name] MRN: [id number] Date of Birth: April 20, 1955 Referring Provider:  Roque Lias, MD  Below are your personal goals for exercise, nutrition, and risk factors. Our goal is to help you stay on track towards obtaining and maintaining these goals. We will be discussing your progress on these goals with you throughout the program.  Initial Exercise Prescription:  Initial Exercise Prescription - 01/09/20 1500      Date of Initial Exercise RX and Referring Provider   Date 01/09/20    Referring Provider Roque Lias MD      Treadmill   MPH 0.7    Grade 0    Minutes 15    METs 1.5      NuStep   Level 1    SPM 80    Minutes 15    METs 1.5      T5 Nustep   Level 1    SPM 80    Minutes 15    METs 1.5      Biostep-RELP   Level 1    SPM 50    Minutes 15    METs 1      Prescription Details   Frequency (times per week) 3    Duration Progress to 30 minutes of continuous aerobic without signs/symptoms of physical distress      Intensity   THRR 40-80% of Max Heartrate 107-140    Ratings of Perceived Exertion 11-13    Perceived Dyspnea 0-4      Progression   Progression Continue to progress workloads to maintain intensity without signs/symptoms of physical distress.      Resistance Training   Training Prescription Yes    Weight 3 lbs    Reps 10-15           Exercise Goals: Frequency: Be able to perform aerobic exercise two to three times per week in program working toward 2-5 days per week of home exercise.  Intensity: Work with a perceived exertion of 11 (fairly light) - 15 (hard) while following your exercise prescription.  We will make changes to your prescription with you as you progress through the program.   Duration: Be able to do 30 to 45 minutes of continuous aerobic exercise in addition to a 5 minute warm-up and a 5 minute cool-down routine.   Nutrition Goals: Your personal nutrition goals will be  established when you do your nutrition analysis with the dietician.  The following are general nutrition guidelines to follow: Cholesterol < 200mg /day Sodium < 1500mg /day Fiber: Men over 50 yrs - 30 grams per day  Personal Goals:  Personal Goals and Risk Factors at Admission - 01/09/20 1534      Core Components/Risk Factors/Patient Goals on Admission    Weight Management Yes;Obesity;Weight Loss    Intervention Weight Management: Develop a combined nutrition and exercise program designed to reach desired caloric intake, while maintaining appropriate intake of nutrient and fiber, sodium and fats, and appropriate energy expenditure required for the weight goal.;Weight Management: Provide education and appropriate resources to help participant work on and attain dietary goals.;Weight Management/Obesity: Establish reasonable short term and long term weight goals.;Obesity: Provide education and appropriate resources to help participant work on and attain dietary goals.    Admit Weight 266 lb 1.6 oz (120.7 kg)    Goal Weight: Short Term 260 lb (117.9 kg)    Goal Weight: Long Term 250 lb (113.4 kg)    Expected Outcomes Short Term: Continue to assess and  modify interventions until short term weight is achieved;Long Term: Adherence to nutrition and physical activity/exercise program aimed toward attainment of established weight goal;Weight Loss: Understanding of general recommendations for a balanced deficit meal plan, which promotes 1-2 lb weight loss per week and includes a negative energy balance of [id number] kcal/d;Understanding recommendations for meals to include 15-35% energy as protein, 25-35% energy from fat, 35-60% energy from carbohydrates, less than 200mg  of dietary cholesterol, 20-35 gm of total fiber daily;Understanding of distribution of calorie intake throughout the day with the consumption of 4-5 meals/snacks    Tobacco Cessation Yes    Number of packs per day 1/2    Intervention Assist the  participant in steps to quit. Provide individualized education and counseling about committing to Tobacco Cessation, relapse prevention, and pharmacological support that can be provided by physician.;Education officer, environmentalffer self-teaching materials, assist with locating and accessing local/national Quit Smoking programs, and support quit date choice.    Expected Outcomes Short Term: Will demonstrate readiness to quit, by selecting a quit date.;Short Term: Will quit all tobacco product use, adhering to prevention of relapse plan.;Long Term: Complete abstinence from all tobacco products for at least 12 months from quit date.    Improve shortness of breath with ADL's Yes    Intervention Provide education, individualized exercise plan and daily activity instruction to help decrease symptoms of SOB with activities of daily living.    Expected Outcomes Short Term: Improve cardiorespiratory fitness to achieve a reduction of symptoms when performing ADLs;Long Term: Be able to perform more ADLs without symptoms or delay the onset of symptoms    Diabetes Yes    Intervention Provide education about signs/symptoms and action to take for hypo/hyperglycemia.;Provide education about proper nutrition, including hydration, and aerobic/resistive exercise prescription along with prescribed medications to achieve blood glucose in normal ranges: Fasting glucose 65-99 mg/dL    Expected Outcomes Short Term: Participant verbalizes understanding of the signs/symptoms and immediate care of hyper/hypoglycemia, proper foot care and importance of medication, aerobic/resistive exercise and nutrition plan for blood glucose control.;Long Term: Attainment of HbA1C < 7%.    Heart Failure Yes    Intervention Provide a combined exercise and nutrition program that is supplemented with education, support and counseling about heart failure. Directed toward relieving symptoms such as shortness of breath, decreased exercise tolerance, and extremity edema.     Expected Outcomes Improve functional capacity of life;Short term: Attendance in program 2-3 days a week with increased exercise capacity. Reported lower sodium intake. Reported increased fruit and vegetable intake. Reports medication compliance.;Short term: Daily weights obtained and reported for increase. Utilizing diuretic protocols set by physician.;Long term: Adoption of self-care skills and reduction of barriers for early signs and symptoms recognition and intervention leading to self-care maintenance.    Hypertension Yes    Intervention Provide education on lifestyle modifcations including regular physical activity/exercise, weight management, moderate sodium restriction and increased consumption of fresh fruit, vegetables, and low fat dairy, alcohol moderation, and smoking cessation.;Monitor prescription use compliance.    Expected Outcomes Short Term: Continued assessment and intervention until BP is < 140/6090mm HG in hypertensive participants. < 130/8380mm HG in hypertensive participants with diabetes, heart failure or chronic kidney disease.;Long Term: Maintenance of blood pressure at goal levels.    Lipids Yes    Intervention Provide education and support for participant on nutrition & aerobic/resistive exercise along with prescribed medications to achieve LDL 70mg , HDL >40mg .    Expected Outcomes Short Term: Participant states understanding of desired cholesterol values and is compliant  with medications prescribed. Participant is following exercise prescription and nutrition guidelines.;Long Term: Cholesterol controlled with medications as prescribed, with individualized exercise RX and with personalized nutrition plan. Value goals: LDL < 70mg , HDL > 40 mg.           Tobacco Use Initial Evaluation: Social History   Tobacco Use  Smoking Status Current Every Day Smoker  . Packs/day: 0.50  . Years: 40.00  . Pack years: 20.00  . Types: Cigarettes  . Last attempt to quit: 01/15/2019  .  Years since quitting: 0.9  Smokeless Tobacco Never Used  Tobacco Comment   has started smoking again, willing to quit.    Exercise Goals and Review:  Exercise Goals    Row Name 01/09/20 1531             Exercise Goals   Increase Physical Activity Yes       Intervention Provide advice, education, support and counseling about physical activity/exercise needs.;Develop an individualized exercise prescription for aerobic and resistive training based on initial evaluation findings, risk stratification, comorbidities and participant's personal goals.       Expected Outcomes Short Term: Attend rehab on a regular basis to increase amount of physical activity.;Long Term: Add in home exercise to make exercise part of routine and to increase amount of physical activity.;Long Term: Exercising regularly at least 3-5 days a week.       Increase Strength and Stamina Yes       Intervention Provide advice, education, support and counseling about physical activity/exercise needs.;Develop an individualized exercise prescription for aerobic and resistive training based on initial evaluation findings, risk stratification, comorbidities and participant's personal goals.       Expected Outcomes Short Term: Increase workloads from initial exercise prescription for resistance, speed, and METs.;Short Term: Perform resistance training exercises routinely during rehab and add in resistance training at home;Long Term: Improve cardiorespiratory fitness, muscular endurance and strength as measured by increased METs and functional capacity (01/11/20)       Able to understand and use rate of perceived exertion (RPE) scale Yes       Intervention Provide education and explanation on how to use RPE scale       Expected Outcomes Short Term: Able to use RPE daily in rehab to express subjective intensity level;Long Term:  Able to use RPE to guide intensity level when exercising independently       Able to understand and use Dyspnea  scale Yes       Intervention Provide education and explanation on how to use Dyspnea scale       Expected Outcomes Short Term: Able to use Dyspnea scale daily in rehab to express subjective sense of shortness of breath during exertion;Long Term: Able to use Dyspnea scale to guide intensity level when exercising independently       Knowledge and understanding of Target Heart Rate Range (THRR) Yes       Intervention Provide education and explanation of THRR including how the numbers were predicted and where they are located for reference       Expected Outcomes Short Term: Able to state/look up THRR;Short Term: Able to use daily as guideline for intensity in rehab;Long Term: Able to use THRR to govern intensity when exercising independently       Able to check pulse independently Yes       Intervention Provide education and demonstration on how to check pulse in carotid and radial arteries.;Review the importance of being able to check your  own pulse for safety during independent exercise       Expected Outcomes Short Term: Able to explain why pulse checking is important during independent exercise;Long Term: Able to check pulse independently and accurately       Understanding of Exercise Prescription Yes       Intervention Provide education, explanation, and written materials on patient's individual exercise prescription       Expected Outcomes Short Term: Able to explain program exercise prescription;Long Term: Able to explain home exercise prescription to exercise independently              Copy of goals given to participant.

## 2020-01-09 NOTE — Progress Notes (Signed)
Jose [name] is a current tobacco user. Intervention for tobacco cessation was provided at the initial medical review. He was asked about readiness to quit and reported that he is ready to quit again . Patient was advised and educated about tobacco cessation using combination therapy, tobacco cessation classes, quit line, and quit smoking apps. He already has patches and is ready to start to use them.  He declined handouts. Patient demonstrated understanding of this material. Staff will continue to provide encouragement and follow up with the patient throughout the program.

## 2020-01-15 ENCOUNTER — Ambulatory Visit: Payer: BC Managed Care – PPO

## 2020-01-17 ENCOUNTER — Encounter: Payer: Self-pay | Admitting: *Deleted

## 2020-01-17 ENCOUNTER — Ambulatory Visit: Payer: BC Managed Care – PPO

## 2020-01-17 ENCOUNTER — Telehealth: Payer: Self-pay | Admitting: *Deleted

## 2020-01-17 DIAGNOSIS — J449 Chronic obstructive pulmonary disease, unspecified: Secondary | ICD-10-CM

## 2020-01-17 NOTE — Progress Notes (Signed)
Pulmonary Individual Treatment Plan  Patient Details  Name: Jose [name] MRN: [id number] Date of Birth: 1955/09/26 Referring Provider:     Pulmonary Rehab from 01/09/2020 in [facility] Cardiac and Pulmonary Rehab  Referring Provider Hulan Fray MD      Initial Encounter Date:    Pulmonary Rehab from 01/09/2020 in [facility] Cardiac and Pulmonary Rehab  Date 01/09/20      Visit Diagnosis: Chronic obstructive pulmonary disease, unspecified COPD type ([place])  Patient's Home Medications on Admission:  Current Outpatient Medications:  .  albuterol (PROVENTIL) (2.5 MG/3ML) 0.083% nebulizer solution, PLEASE SEE ATTACHED FOR DETAILED DIRECTIONS, Disp: , Rfl:  .  albuterol (PROVENTIL) (5 MG/ML) 0.5% nebulizer solution, Take 0.5 mLs (2.5 mg total) by nebulization every 6 (six) hours as needed for wheezing or shortness of breath., Disp: 20 mL, Rfl: 12 .  aspirin EC 81 MG tablet, Take 81 mg by mouth daily. (Patient not taking: Reported on 01/08/2020), Disp: , Rfl:  .  Blood Glucose Monitoring Suppl (CONTOUR NEXT MONITOR) w/Device KIT, by Does not apply route., Disp: , Rfl:  .  Blood Glucose Monitoring Suppl (GLUCOCOM BLOOD GLUCOSE MONITOR) DEVI, 1 each by XX route as directed (For Blood Glucose Monitoring), Disp: , Rfl:  .  doxycycline (VIBRA-TABS) 100 MG tablet, Take 1 tablet (100 mg total) by mouth every 12 (twelve) hours., Disp: 14 tablet, Rfl: 0 .  DULoxetine (CYMBALTA) 60 MG capsule, Take by mouth., Disp: , Rfl:  .  enoxaparin (LOVENOX) 120 MG/0.8ML injection, Inject 0.73 mLs (110 mg total) into the skin every 12 (twelve) hours for 7 days., Disp: 10.22 mL, Rfl: 0 .  Fluticasone Propionate, Inhal, 100 MCG/BLIST AEPB, Inhale 1 puff into the lungs 2 (two) times daily., Disp: , Rfl:  .  gabapentin (NEURONTIN) 300 MG capsule, Take 600 mg by mouth 3 (three) times daily. , Disp: , Rfl:  .  glucose blood (CONTOUR NEXT TEST) test strip, Use to check blood sugar twice 2 time(s) daily., Disp: , Rfl:  .   HYDROcodone-acetaminophen (NORCO) 10-325 MG tablet, Take 1 tablet by mouth 2 (two) times daily. (Patient not taking: Reported on 01/08/2020), Disp: , Rfl:  .  ipratropium-albuterol (DUONEB) 0.5-2.5 (3) MG/3ML SOLN, Inhale into the lungs., Disp: , Rfl:  .  Lancets (ACCU-CHEK SOFT TOUCH) lancets, 2 (two) times daily., Disp: , Rfl:  .  metoprolol tartrate (LOPRESSOR) 12.5 mg TABS tablet, Take 12.5 mg by mouth 2 (two) times daily. , Disp: , Rfl:  .  nicotine (NICODERM CQ - DOSED IN MG/24 HOURS) 14 mg/24hr patch, Apply one patch daily x 2 weeks.  Begin after completion of 21 mg patches, Disp: , Rfl:  .  nicotine (NICODERM CQ - DOSED IN MG/24 HOURS) 21 mg/24hr patch, 1 patch daily., Disp: , Rfl:  .  nicotine (NICODERM CQ - DOSED IN MG/24 HR) 7 mg/24hr patch, Apply one patch daily x 2 weeks.  Begin after completion of 14 mg patches, Disp: , Rfl:  .  nicotine polacrilex (NICORETTE) 4 MG gum, Take by mouth., Disp: , Rfl:  .  nitroGLYCERIN (NITROSTAT) 0.4 MG SL tablet, Place 0.4 mg under the tongue every 5 (five) minutes as needed for chest pain. , Disp: , Rfl: 1 .  pantoprazole (PROTONIX) 40 MG tablet, Take 40 mg by mouth 2 (two) times daily., Disp: , Rfl: 3 .  PROAIR HFA 108 (90 Base) MCG/ACT inhaler, Inhale 1-2 puffs into the lungs every 4 (four) hours as needed for wheezing. , Disp: , Rfl: 12 .  rosuvastatin (CRESTOR) 10 MG tablet, Take 10 mg by mouth every evening., Disp: , Rfl: 11 .  Semaglutide, 1 MG/DOSE, (OZEMPIC, 1 MG/DOSE,) 4 MG/3ML SOPN, INJECT 1 MILLIGRAM SUBCUTANEOUSLY ONCE A WEEK, Disp: , Rfl:  .  tamsulosin (FLOMAX) 0.4 MG CAPS capsule, Take 0.4 mg by mouth daily., Disp: , Rfl: 0 .  Tiotropium Bromide-Olodaterol (STIOLTO RESPIMAT) 2.5-2.5 MCG/ACT AERS, Inhale 2 puffs into the lungs daily. , Disp: , Rfl:  .  tiZANidine (ZANAFLEX) 4 MG tablet, Take 4 mg by mouth 3 (three) times daily., Disp: , Rfl:  .  topiramate (TOPAMAX) 50 MG tablet, Take 50 mg by mouth 2 (two) times daily., Disp: , Rfl: 0 .   torsemide (DEMADEX) 20 MG tablet, Take 40 mg by mouth daily., Disp: , Rfl:  .  warfarin (COUMADIN) 5 MG tablet, Take 5-6 mg by mouth daily. 5 mg daily, except 6 mg on Mondays and Fridays, Disp: , Rfl: 0  Past Medical History: Past Medical History:  Diagnosis Date  . Antiphospholipid antibody syndrome (HCC)   . CAD S/P percutaneous coronary angioplasty '98, '04, Feb 2017   [facility]  . Carotid arterial disease (HCC) 10/2015   moderate bilateral  . COPD (chronic obstructive pulmonary disease) (HCC)   . Coronary artery disease   . Hypertension   . Non-insulin treated type 2 diabetes mellitus (HCC)   . Pulmonary emboli (HCC) 2011  . RBBB   . Sleep apnea     Tobacco Use: Social History   Tobacco Use  Smoking Status Current Every Day Smoker  . Packs/day: 0.50  . Years: 40.00  . Pack years: 20.00  . Types: Cigarettes  . Last attempt to quit: 01/15/2019  . Years since quitting: 1.0  Smokeless Tobacco Never Used  Tobacco Comment   has started smoking again, willing to quit.    Labs: Recent Review Flowsheet Data    Labs for ITP Cardiac and Pulmonary Rehab Latest Ref Rng & Units 08/16/2016 12/27/2018 03/15/2019   Cholestrol 0 - 200 mg/dL 156 - -   LDLCALC 0 - 99 mg/dL 64 - -   HDL >40 mg/dL 28(L) - -   Trlycerides <150 mg/dL 322(H) - -   Hemoglobin A1c 4.8 - 5.6 % 5.9(H) - 6.9(H)   HCO3 20.0 - 28.0 mmol/L - 31.8(H) -   TCO2 22 - 32 mmol/L - 34(H) -   O2SAT % - 95.0 -       Pulmonary Assessment Scores:  Pulmonary Assessment Scores    Row Name 01/09/20 1535         ADL UCSD   ADL Phase Entry     SOB Score total 114     Rest 1     Walk 4     Stairs 5     Bath 5     Dress 5     Shop 5       CAT Score   CAT Score 28       mMRC Score   mMRC Score 4            UCSD: Self-administered rating of dyspnea associated with activities of daily living (ADLs) 6-point scale (0 = "not at all" to 5 = "maximal or unable to do because of breathlessness")  Scoring Scores range  from 0 to 120.  Minimally important difference is 5 units  CAT: CAT can identify the health impairment of COPD patients and is better correlated with disease progression.  CAT has a scoring range of   zero to 40. The CAT score is classified into four groups of low (less than 10), medium (10 - 20), high (21-30) and very high (31-40) based on the impact level of disease on health status. A CAT score over 10 suggests significant symptoms.  A worsening CAT score could be explained by an exacerbation, poor medication adherence, poor inhaler technique, or progression of COPD or comorbid conditions.  CAT MCID is 2 points  mMRC: mMRC (Modified Medical Research Council) Dyspnea Scale is used to assess the degree of baseline functional disability in patients of respiratory disease due to dyspnea. No minimal important difference is established. A decrease in score of 1 point or greater is considered a positive change.   Pulmonary Function Assessment:  Pulmonary Function Assessment - 01/08/20 1347      Breath   Shortness of Breath Limiting activity;Yes           Exercise Target Goals: Exercise Program Goal: Individual exercise prescription set using results from initial 6 min walk test and THRR while considering  patient's activity barriers and safety.   Exercise Prescription Goal: Initial exercise prescription builds to 30-45 minutes a day of aerobic activity, 2-3 days per week.  Home exercise guidelines will be given to patient during program as part of exercise prescription that the participant will acknowledge.  Education: Aerobic Exercise & Resistance Training: - Gives group verbal and written instruction on the various components of exercise. Focuses on aerobic and resistive training programs and the benefits of this training and how to safely progress through these programs..   Cardiac Rehab from 02/16/2018 in [facility] Cardiac and Pulmonary Rehab  Date 10/18/17  Educator KH  Instruction Review  Code 1- Verbalizes Understanding      Education: Exercise & Equipment Safety: - Individual verbal instruction and demonstration of equipment use and safety with use of the equipment.   Pulmonary Rehab from 01/09/2020 in [facility] Cardiac and Pulmonary Rehab  Date 01/08/20  Educator JH  Instruction Review Code 1- Verbalizes Understanding      Education: Exercise Physiology & General Exercise Guidelines: - Group verbal and written instruction with models to review the exercise physiology of the cardiovascular system and associated critical values. Provides general exercise guidelines with specific guidelines to those with heart or lung disease.    Pulmonary Rehab from 01/09/2020 in [facility] Cardiac and Pulmonary Rehab  Education need identified 01/09/20      Education: Flexibility, Balance, Mind/Body Relaxation: Provides group verbal/written instruction on the benefits of flexibility and balance training, including mind/body exercise modes such as yoga, pilates and tai chi.  Demonstration and skill practice provided.   Cardiac Rehab from 02/16/2018 in [facility] Cardiac and Pulmonary Rehab  Date 02/07/18  Educator AS  Instruction Review Code 1- Verbalizes Understanding      Activity Barriers & Risk Stratification:  Activity Barriers & Cardiac Risk Stratification - 01/09/20 1529      Activity Barriers & Cardiac Risk Stratification   Activity Barriers Arthritis;Back Problems;Neck/Spine Problems;Muscular Weakness;Deconditioning;Balance Concerns;Shortness of Breath;Decreased Ventricular Function           6 Minute Walk:  6 Minute Walk    Row Name 01/09/20 1527         6 Minute Walk   Phase Initial     Distance 385 feet     Walk Time 5.02 minutes     # of Rest Breaks 3  9 sec, 12 sec, 8 sec, stopped at 5:30     MPH 0.87       METS 1     RPE 19     Perceived Dyspnea  4     VO2 Peak 3.52     Symptoms Yes (comment)     Comments SOB, back/leg spasms 8-10/10     Resting HR 75 bpm      Resting BP 148/74     Resting Oxygen Saturation  94 %     Exercise Oxygen Saturation  during 6 min walk 88 %     Max Ex. HR 96 bpm     Max Ex. BP 164/70     2 Minute Post BP 156/74       Interval HR   1 Minute HR 93     2 Minute HR 92     3 Minute HR 96     4 Minute HR 93     5 Minute HR 94     6 Minute HR 93     2 Minute Post HR 90     Interval Heart Rate? Yes       Interval Oxygen   Interval Oxygen? Yes     Baseline Oxygen Saturation % 94 %     1 Minute Oxygen Saturation % 90 %     1 Minute Liters of Oxygen 0 L  Room Air     2 Minute Oxygen Saturation % 91 %     2 Minute Liters of Oxygen 0 L     3 Minute Oxygen Saturation % 90 %     3 Minute Liters of Oxygen 0 L     4 Minute Oxygen Saturation % 91 %     4 Minute Liters of Oxygen 0 L     5 Minute Oxygen Saturation % 92 %     5 Minute Liters of Oxygen 0 L     6 Minute Oxygen Saturation % 88 %     6 Minute Liters of Oxygen 0 L     2 Minute Post Oxygen Saturation % 93 %     2 Minute Post Liters of Oxygen 0 L           Oxygen Initial Assessment:  Oxygen Initial Assessment - 01/08/20 1343      Home Oxygen   Home Oxygen Device Home Concentrator    Sleep Oxygen Prescription BiPAP   Does not wear since it is uncomfortable and wants a CPAP instead.   Liters per minute 3    Home Exercise Oxygen Prescription None    Home Resting Oxygen Prescription None    Compliance with Home Oxygen Use No      Initial 6 min Walk   Oxygen Used None      Program Oxygen Prescription   Program Oxygen Prescription None      Intervention   Short Term Goals To learn and exhibit compliance with exercise, home and travel O2 prescription;To learn and understand importance of monitoring SPO2 with pulse oximeter and demonstrate accurate use of the pulse oximeter.;To learn and demonstrate proper pursed lip breathing techniques or other breathing techniques.;To learn and demonstrate proper use of respiratory medications;To learn and understand  importance of maintaining oxygen saturations>88%    Long  Term Goals Exhibits compliance with exercise, home and travel O2 prescription;Maintenance of O2 saturations>88%;Compliance with respiratory medication;Demonstrates proper use of MDI's;Exhibits proper breathing techniques, such as pursed lip breathing or other method taught during program session;Verbalizes importance of monitoring SPO2 with pulse oximeter and return demonstration  Oxygen Re-Evaluation:   Oxygen Discharge (Final Oxygen Re-Evaluation):   Initial Exercise Prescription:  Initial Exercise Prescription - 01/09/20 1500      Date of Initial Exercise RX and Referring Provider   Date 01/09/20    Referring Provider Hulan Fray MD      Treadmill   MPH 0.7    Grade 0    Minutes 15    METs 1.5      NuStep   Level 1    SPM 80    Minutes 15    METs 1.5      T5 Nustep   Level 1    SPM 80    Minutes 15    METs 1.5      Biostep-RELP   Level 1    SPM 50    Minutes 15    METs 1      Prescription Details   Frequency (times per week) 3    Duration Progress to 30 minutes of continuous aerobic without signs/symptoms of physical distress      Intensity   THRR 40-80% of Max Heartrate 107-140    Ratings of Perceived Exertion 11-13    Perceived Dyspnea 0-4      Progression   Progression Continue to progress workloads to maintain intensity without signs/symptoms of physical distress.      Resistance Training   Training Prescription Yes    Weight 3 lbs    Reps 10-15           Perform Capillary Blood Glucose checks as needed.  Exercise Prescription Changes:   Exercise Comments:   Exercise Goals and Review:  Exercise Goals    Row Name 01/09/20 1531             Exercise Goals   Increase Physical Activity Yes       Intervention Provide advice, education, support and counseling about physical activity/exercise needs.;Develop an individualized exercise prescription for aerobic and  resistive training based on initial evaluation findings, risk stratification, comorbidities and participant's personal goals.       Expected Outcomes Short Term: Attend rehab on a regular basis to increase amount of physical activity.;Long Term: Add in home exercise to make exercise part of routine and to increase amount of physical activity.;Long Term: Exercising regularly at least 3-5 days a week.       Increase Strength and Stamina Yes       Intervention Provide advice, education, support and counseling about physical activity/exercise needs.;Develop an individualized exercise prescription for aerobic and resistive training based on initial evaluation findings, risk stratification, comorbidities and participant's personal goals.       Expected Outcomes Short Term: Increase workloads from initial exercise prescription for resistance, speed, and METs.;Short Term: Perform resistance training exercises routinely during rehab and add in resistance training at home;Long Term: Improve cardiorespiratory fitness, muscular endurance and strength as measured by increased METs and functional capacity (6MWT)       Able to understand and use rate of perceived exertion (RPE) scale Yes       Intervention Provide education and explanation on how to use RPE scale       Expected Outcomes Short Term: Able to use RPE daily in rehab to express subjective intensity level;Long Term:  Able to use RPE to guide intensity level when exercising independently       Able to understand and use Dyspnea scale Yes       Intervention Provide education and explanation on how to use Dyspnea  scale       Expected Outcomes Short Term: Able to use Dyspnea scale daily in rehab to express subjective sense of shortness of breath during exertion;Long Term: Able to use Dyspnea scale to guide intensity level when exercising independently       Knowledge and understanding of Target Heart Rate Range (THRR) Yes       Intervention Provide education and  explanation of THRR including how the numbers were predicted and where they are located for reference       Expected Outcomes Short Term: Able to state/look up THRR;Short Term: Able to use daily as guideline for intensity in rehab;Long Term: Able to use THRR to govern intensity when exercising independently       Able to check pulse independently Yes       Intervention Provide education and demonstration on how to check pulse in carotid and radial arteries.;Review the importance of being able to check your own pulse for safety during independent exercise       Expected Outcomes Short Term: Able to explain why pulse checking is important during independent exercise;Long Term: Able to check pulse independently and accurately       Understanding of Exercise Prescription Yes       Intervention Provide education, explanation, and written materials on patient's individual exercise prescription       Expected Outcomes Short Term: Able to explain program exercise prescription;Long Term: Able to explain home exercise prescription to exercise independently              Exercise Goals Re-Evaluation :   Discharge Exercise Prescription (Final Exercise Prescription Changes):   Nutrition:  Target Goals: Understanding of nutrition guidelines, daily intake of sodium <1535m, cholesterol <2027m calories 30% from fat and 7% or less from saturated fats, daily to have 5 or more servings of fruits and vegetables.  Education: Controlling Sodium/Reading Food Labels -Group verbal and written material supporting the discussion of sodium use in heart healthy nutrition. Review and explanation with models, verbal and written materials for utilization of the food label.   Education: General Nutrition Guidelines/Fats and Fiber: -Group instruction provided by verbal, written material, models and posters to present the general guidelines for heart healthy nutrition. Gives an explanation and review of dietary fats and  fiber.   Biometrics:  Pre Biometrics - 01/09/20 1533      Pre Biometrics   Height 5' 7" (1.702 m)    Weight 266 lb 1.6 oz (120.7 kg)    BMI (Calculated) 41.67    Single Leg Stand 0.9 seconds            Nutrition Therapy Plan and Nutrition Goals:   Nutrition Assessments:  Nutrition Assessments - 01/09/20 1533      MEDFICTS Scores   Pre Score 102           MEDIFICTS Score Key:          ?70 Need to make dietary changes          40-70 Heart Healthy Diet         ? 40 Therapeutic Level Cholesterol Diet  Nutrition Goals Re-Evaluation:   Nutrition Goals Discharge (Final Nutrition Goals Re-Evaluation):   Psychosocial: Target Goals: Acknowledge presence or absence of significant depression and/or stress, maximize coping skills, provide positive support system. Participant is able to verbalize types and ability to use techniques and skills needed for reducing stress and depression.   Education: Depression - Provides group verbal and written instruction on the  correlation between heart/lung disease and depressed mood, treatment options, and the stigmas associated with seeking treatment.   Education: Sleep Hygiene -Provides group verbal and written instruction about how sleep can affect your health.  Define sleep hygiene, discuss sleep cycles and impact of sleep habits. Review good sleep hygiene tips.    Cardiac Rehab from 02/16/2018 in [facility] Cardiac and Pulmonary Rehab  Date 10/06/17  Educator Lucianne Lei, MSW  Instruction Review Code 1- Verbalizes Understanding      Education: Stress and Anxiety: - Provides group verbal and written instruction about the health risks of elevated stress and causes of high stress.  Discuss the correlation between heart/lung disease and anxiety and treatment options. Review healthy ways to manage with stress and anxiety.   Initial Review & Psychosocial Screening:  Initial Psych Review & Screening - 01/08/20 1348      Initial Review    Current issues with Current Stress Concerns    Source of Stress Concerns Chronic Illness    Comments His breathing has been tough for him and makes it heard for him to do his daily activities. He wants to be able to move more and breath easier so he can live life better.      Family Dynamics   Good Support System? Yes    Comments He can look to his wife for support. His daughter and son in law just moved into his sub division.      Barriers   Psychosocial barriers to participate in program The patient should benefit from training in stress management and relaxation.      Screening Interventions   Interventions To provide support and resources with identified psychosocial needs;Provide feedback about the scores to participant;Encouraged to exercise    Expected Outcomes Short Term goal: Utilizing psychosocial counselor, staff and physician to assist with identification of specific Stressors or current issues interfering with healing process. Setting desired goal for each stressor or current issue identified.;Long Term Goal: Stressors or current issues are controlled or eliminated.;Short Term goal: Identification and review with participant of any Quality of Life or Depression concerns found by scoring the questionnaire.;Long Term goal: The participant improves quality of Life and PHQ9 Scores as seen by post scores and/or verbalization of changes           Quality of Life Scores:  Scores of 19 and below usually indicate a poorer quality of life in these areas.  A difference of  2-3 points is a clinically meaningful difference.  A difference of 2-3 points in the total score of the Quality of Life Index has been associated with significant improvement in overall quality of life, self-image, physical symptoms, and general health in studies assessing change in quality of life.  PHQ-9: Recent Review Flowsheet Data    Depression screen [facility] 2/9 01/09/2020 02/03/2018 10/04/2017   Decreased Interest _0 Down, Depressed, Hopeless _1 PHQ - 2 Score _2 Altered sleeping _3 Tired, decreased energy _4 Change in appetite _5 Feeling bad or failure about yourself  _6 Trouble concentrating 1 0 0   Moving slowly or fidgety/restless 0 0 0   Suicidal thoughts 1  0 0   PHQ-9 Score _7 Difficult doing work/chores Somewhat difficult Somewhat difficult Somewhat difficult     Interpretation of Total Score  Total Score Depression Severity:  1-4 = Minimal depression, 5-9 = Mild depression, 10-14 = Moderate depression, 15-19 = Moderately severe depression, 20-27 = Severe depression   Psychosocial Evaluation and Intervention:  Psychosocial Evaluation - 01/08/20 1350      Psychosocial Evaluation & Interventions   Interventions Encouraged to exercise with the program and follow exercise prescription;Relaxation education;Stress management education    Comments His breathing has been tough for him and makes it heard for him to do his daily activities. He wants to be able to move more and breath easier so he can live life better.    Expected Outcomes Short: Exercise regularly to support mental health and notify staff of any changes. Long: maintain mental health and well being through teaching of rehab or prescribed medications independently.    Continue Psychosocial Services  Follow up required by staff           Psychosocial Re-Evaluation:   Psychosocial Discharge (Final Psychosocial Re-Evaluation):   Education: Education Goals: Education classes will be provided on a weekly basis, covering required topics. Participant will state understanding/return demonstration of topics presented.  Learning Barriers/Preferences:  Learning Barriers/Preferences - 01/08/20 1348      Learning Barriers/Preferences   Learning Barriers None    Learning Preferences None           General Pulmonary Education Topics:  Infection Prevention: - Provides verbal and written  material to individual with discussion of infection control including proper hand washing and proper equipment cleaning during exercise session.   Pulmonary Rehab from 01/09/2020 in [facility] Cardiac and Pulmonary Rehab  Date 01/08/20  Educator [facility]  Instruction Review Code 1- Verbalizes Understanding      Falls Prevention: - Provides verbal and written material to individual with discussion of falls prevention and safety.   Pulmonary Rehab from 01/09/2020 in [facility] Cardiac and Pulmonary Rehab  Date 01/08/20  Educator [facility]  Instruction Review Code 1- Verbalizes Understanding      Chronic Lung Diseases: - Group verbal and written instruction to review updates, respiratory medications, advancements in procedures and treatments. Discuss use of supplemental oxygen including available portable oxygen systems, continuous and intermittent flow rates, concentrators, personal use and safety guidelines. Review proper use of inhaler and spacers. Provide informative websites for self-education.    Pulmonary Rehab from 01/09/2020 in [facility]iac and Pulmonary Rehab  Education need identified 01/09/20      Energy Conservation: - Provide group verbal and written instruction for methods to conserve energy, plan and organize activities. Instruct on pacing techniques, use of adaptive equipment and posture/positioning to relieve shortness of breath.   Triggers and Exacerbations: - Group verbal and written instruction to review types of environmental triggers and ways to prevent exacerbations. Discuss weather changes, air quality and the benefits of nasal washing. Review warning signs and symptoms to help prevent infections. Discuss techniques for effective airway clearance, coughing, and vibrations.   AED/CPR: - Group verbal and written instruction with the use of models to demonstrate the basic use of the AED with the basic ABC's of resuscitation.   Anatomy and Physiology of the Lungs: - Group verbal and  written instruction with the use of models to provide basic lung anatomy and physiology related to function, structure and complications of lung disease.   Anatomy & Physiology of the Heart: - Group verbal and written instruction and models provide basic cardiac anatomy and physiology, with the coronary electrical and arterial systems. Review of Valvular disease and Heart Failure   Cardiac Medications: - Group verbal and written  instruction to review commonly prescribed medications for heart disease. Reviews the medication, class of the drug, and side effects.   Other: -Provides group and verbal instruction on various topics (see comments)   Knowledge Questionnaire Score:  Knowledge Questionnaire Score - 01/09/20 1534      Knowledge Questionnaire Score   Pre Score 15/18 educaiton focus: exercise, O2 safety, PLB            Core Components/Risk Factors/Patient Goals at Admission:  Personal Goals and Risk Factors at Admission - 01/09/20 1534      Core Components/Risk Factors/Patient Goals on Admission    Weight Management Yes;Obesity;Weight Loss    Intervention Weight Management: Develop a combined nutrition and exercise program designed to reach desired caloric intake, while maintaining appropriate intake of nutrient and fiber, sodium and fats, and appropriate energy expenditure required for the weight goal.;Weight Management: Provide education and appropriate resources to help participant work on and attain dietary goals.;Weight Management/Obesity: Establish reasonable short term and long term weight goals.;Obesity: Provide education and appropriate resources to help participant work on and attain dietary goals.    Admit Weight 266 lb 1.6 oz (120.7 kg)    Goal Weight: Short Term 260 lb (117.9 kg)    Goal Weight: Long Term 250 lb (113.4 kg)    Expected Outcomes Short Term: Continue to assess and modify interventions until short term weight is achieved;Long Term: Adherence to nutrition  and physical activity/exercise program aimed toward attainment of established weight goal;Weight Loss: Understanding of general recommendations for a balanced deficit meal plan, which promotes 1-2 lb weight loss per week and includes a negative energy balance of [phone] kcal/d;Understanding recommendations for meals to include 15-35% energy as protein, 25-35% energy from fat, 35-60% energy from carbohydrates, less than 265m of dietary cholesterol, 20-35 gm of total fiber daily;Understanding of distribution of calorie intake throughout the day with the consumption of 4-5 meals/snacks    Tobacco Cessation Yes    Number of packs per day 1/2    Intervention Assist the participant in steps to quit. Provide individualized education and counseling about committing to Tobacco Cessation, relapse prevention, and pharmacological support that can be provided by physician.;OAdvice worker assist with locating and accessing local/national Quit Smoking programs, and support quit date choice.    Expected Outcomes Short Term: Will demonstrate readiness to quit, by selecting a quit date.;Short Term: Will quit all tobacco product use, adhering to prevention of relapse plan.;Long Term: Complete abstinence from all tobacco products for at least 12 months from quit date.    Improve shortness of breath with ADL's Yes    Intervention Provide education, individualized exercise plan and daily activity instruction to help decrease symptoms of SOB with activities of daily living.    Expected Outcomes Short Term: Improve cardiorespiratory fitness to achieve a reduction of symptoms when performing ADLs;Long Term: Be able to perform more ADLs without symptoms or delay the onset of symptoms    Diabetes Yes    Intervention Provide education about signs/symptoms and action to take for hypo/hyperglycemia.;Provide education about proper nutrition, including hydration, and aerobic/resistive exercise prescription along with  prescribed medications to achieve blood glucose in normal ranges: Fasting glucose 65-99 mg/dL    Expected Outcomes Short Term: Participant verbalizes understanding of the signs/symptoms and immediate care of hyper/hypoglycemia, proper foot care and importance of medication, aerobic/resistive exercise and nutrition plan for blood glucose control.;Long Term: Attainment of HbA1C < 7%.    Heart Failure Yes    Intervention Provide a combined  exercise and nutrition program that is supplemented with education, support and counseling about heart failure. Directed toward relieving symptoms such as shortness of breath, decreased exercise tolerance, and extremity edema.    Expected Outcomes Improve functional capacity of life;Short term: Attendance in program 2-3 days a week with increased exercise capacity. Reported lower sodium intake. Reported increased fruit and vegetable intake. Reports medication compliance.;Short term: Daily weights obtained and reported for increase. Utilizing diuretic protocols set by physician.;Long term: Adoption of self-care skills and reduction of barriers for early signs and symptoms recognition and intervention leading to self-care maintenance.    Hypertension Yes    Intervention Provide education on lifestyle modifcations including regular physical activity/exercise, weight management, moderate sodium restriction and increased consumption of fresh fruit, vegetables, and low fat dairy, alcohol moderation, and smoking cessation.;Monitor prescription use compliance.    Expected Outcomes Short Term: Continued assessment and intervention until BP is < 140/31m HG in hypertensive participants. < 130/850mHG in hypertensive participants with diabetes, heart failure or chronic kidney disease.;Long Term: Maintenance of blood pressure at goal levels.    Lipids Yes    Intervention Provide education and support for participant on nutrition & aerobic/resistive exercise along with prescribed  medications to achieve LDL <7048mHDL >12m89m  Expected Outcomes Short Term: Participant states understanding of desired cholesterol values and is compliant with medications prescribed. Participant is following exercise prescription and nutrition guidelines.;Long Term: Cholesterol controlled with medications as prescribed, with individualized exercise RX and with personalized nutrition plan. Value goals: LDL < 70mg52mL > 40 mg.           Education:Diabetes - Individual verbal and written instruction to review signs/symptoms of diabetes, desired ranges of glucose level fasting, after meals and with exercise. Acknowledge that pre and post exercise glucose checks will be done for 3 sessions at entry of program.   Pulmonary Rehab from 01/09/2020 in [facility] and Pulmonary Rehab  Date 01/08/20  Educator [facility] Code 1- Verbalizes Understanding      Education: Know Your Numbers and Risk Factors: -Group verbal and written instruction about important numbers in your health.  Discussion of what are risk factors and how they play a role in the disease process.  Review of Cholesterol, Blood Pressure, Diabetes, and BMI and the role they play in your overall health.   Cardiac Rehab from 02/16/2018 in [facility] and Pulmonary Rehab  Date 02/14/18  Educator CE  Instruction Review Code 1- Verbalizes Understanding      Core Components/Risk Factors/Patient Goals Review:   Goals and Risk Factor Review    Row Name 01/09/20 1532             Core Components/Risk Factors/Patient Goals Review   Personal Goals Review Tobacco Cessation       Review JeffrDabid current tobacco user. Intervention for tobacco cessation was provided at the initial medical review. He was asked about readiness to quit and reported that he is ready to quit again . Patient was advised and educated about tobacco cessation using combination therapy, tobacco cessation classes, quit line, and quit smoking apps. He  already has patches and is ready to start to use them.  He declined handouts. Patient demonstrated understanding of this material. Staff will continue to provide encouragement and follow up with the patient throughout the program.       Expected Outcomes Short: Start using patches Long: Set quit date  Core Components/Risk Factors/Patient Goals at Discharge (Final Review):   Goals and Risk Factor Review - 01/09/20 1532      Core Components/Risk Factors/Patient Goals Review   Personal Goals Review Tobacco Cessation    Review Davison is a current tobacco user. Intervention for tobacco cessation was provided at the initial medical review. He was asked about readiness to quit and reported that he is ready to quit again . Patient was advised and educated about tobacco cessation using combination therapy, tobacco cessation classes, quit line, and quit smoking apps. He already has patches and is ready to start to use them.  He declined handouts. Patient demonstrated understanding of this material. Staff will continue to provide encouragement and follow up with the patient throughout the program.    Expected Outcomes Short: Start using patches Long: Set quit date           ITP Comments:  ITP Comments    Row Name 01/08/20 1419 01/09/20 1527 01/17/20 0650       ITP Comments Virtual Visit completed. Patient informed on EP and RD appointment and 6 Minute walk test. Patient also informed of patient health questionnaires on My Chart. Patient Verbalizes understanding. Visit diagnosis can be found in [facility]. Completed 6MWT and gym orientation. Initial ITP created and sent for review to Dr. Emily Filbert, Medical Director. 30 Day review completed. Medical Director ITP review done, changes made as directed, and signed approval by Medical Director.            Comments:

## 2020-01-17 NOTE — Telephone Encounter (Signed)
Pt called out today.  Still not feeling well.  He was encouraged to contact his PCP to advise on meds and was encouraged to get tested.  He will be out for the week.

## 2020-01-18 ENCOUNTER — Other Ambulatory Visit: Payer: Self-pay

## 2020-01-18 ENCOUNTER — Inpatient Hospital Stay (HOSPITAL_COMMUNITY)
Admission: EM | Admit: 2020-01-18 | Discharge: 2020-01-22 | DRG: 193 | Disposition: A | Discharge status: Skilled Nursing Facility | Payer: BC Managed Care – PPO | Attending: Student in an Organized Health Care Education/Training Program | Admitting: Student in an Organized Health Care Education/Training Program

## 2020-01-18 ENCOUNTER — Emergency Department (HOSPITAL_COMMUNITY): Payer: BC Managed Care – PPO

## 2020-01-18 ENCOUNTER — Other Ambulatory Visit (HOSPITAL_COMMUNITY): Payer: Self-pay

## 2020-01-18 ENCOUNTER — Ambulatory Visit: Payer: BC Managed Care – PPO

## 2020-01-18 DIAGNOSIS — D6861 Antiphospholipid syndrome: Secondary | ICD-10-CM | POA: Diagnosis present

## 2020-01-18 DIAGNOSIS — I4510 Unspecified right bundle-branch block: Secondary | ICD-10-CM | POA: Diagnosis present

## 2020-01-18 DIAGNOSIS — E669 Obesity, unspecified: Secondary | ICD-10-CM | POA: Diagnosis present

## 2020-01-18 DIAGNOSIS — J189 Pneumonia, unspecified organism: Principal | ICD-10-CM | POA: Diagnosis present

## 2020-01-18 DIAGNOSIS — K219 Gastro-esophageal reflux disease without esophagitis: Secondary | ICD-10-CM | POA: Diagnosis present

## 2020-01-18 DIAGNOSIS — E872 Acidosis: Secondary | ICD-10-CM | POA: Diagnosis present

## 2020-01-18 DIAGNOSIS — J449 Chronic obstructive pulmonary disease, unspecified: Secondary | ICD-10-CM | POA: Diagnosis present

## 2020-01-18 DIAGNOSIS — Z9981 Dependence on supplemental oxygen: Secondary | ICD-10-CM

## 2020-01-18 DIAGNOSIS — Z8249 Family history of ischemic heart disease and other diseases of the circulatory system: Secondary | ICD-10-CM

## 2020-01-18 DIAGNOSIS — N400 Enlarged prostate without lower urinary tract symptoms: Secondary | ICD-10-CM | POA: Diagnosis present

## 2020-01-18 DIAGNOSIS — J440 Chronic obstructive pulmonary disease with acute lower respiratory infection: Secondary | ICD-10-CM | POA: Diagnosis present

## 2020-01-18 DIAGNOSIS — I350 Nonrheumatic aortic (valve) stenosis: Secondary | ICD-10-CM | POA: Diagnosis present

## 2020-01-18 DIAGNOSIS — I110 Hypertensive heart disease with heart failure: Secondary | ICD-10-CM | POA: Diagnosis present

## 2020-01-18 DIAGNOSIS — E785 Hyperlipidemia, unspecified: Secondary | ICD-10-CM | POA: Diagnosis present

## 2020-01-18 DIAGNOSIS — Z951 Presence of aortocoronary bypass graft: Secondary | ICD-10-CM

## 2020-01-18 DIAGNOSIS — Z6838 Body mass index (BMI) 38.0-38.9, adult: Secondary | ICD-10-CM

## 2020-01-18 DIAGNOSIS — G9341 Metabolic encephalopathy: Secondary | ICD-10-CM | POA: Diagnosis present

## 2020-01-18 DIAGNOSIS — Z23 Encounter for immunization: Secondary | ICD-10-CM | POA: Diagnosis not present

## 2020-01-18 DIAGNOSIS — I5033 Acute on chronic diastolic (congestive) heart failure: Secondary | ICD-10-CM | POA: Diagnosis present

## 2020-01-18 DIAGNOSIS — I2510 Atherosclerotic heart disease of native coronary artery without angina pectoris: Secondary | ICD-10-CM | POA: Diagnosis present

## 2020-01-18 DIAGNOSIS — J9622 Acute and chronic respiratory failure with hypercapnia: Secondary | ICD-10-CM | POA: Diagnosis not present

## 2020-01-18 DIAGNOSIS — Z888 Allergy status to other drugs, medicaments and biological substances status: Secondary | ICD-10-CM

## 2020-01-18 DIAGNOSIS — R042 Hemoptysis: Secondary | ICD-10-CM | POA: Diagnosis present

## 2020-01-18 DIAGNOSIS — E119 Type 2 diabetes mellitus without complications: Secondary | ICD-10-CM

## 2020-01-18 DIAGNOSIS — Z79899 Other long term (current) drug therapy: Secondary | ICD-10-CM

## 2020-01-18 DIAGNOSIS — Z86711 Personal history of pulmonary embolism: Secondary | ICD-10-CM

## 2020-01-18 DIAGNOSIS — E1169 Type 2 diabetes mellitus with other specified complication: Secondary | ICD-10-CM

## 2020-01-18 DIAGNOSIS — I50813 Acute on chronic right heart failure: Secondary | ICD-10-CM | POA: Diagnosis present

## 2020-01-18 DIAGNOSIS — Z20822 Contact with and (suspected) exposure to covid-19: Secondary | ICD-10-CM | POA: Diagnosis present

## 2020-01-18 DIAGNOSIS — F17210 Nicotine dependence, cigarettes, uncomplicated: Secondary | ICD-10-CM | POA: Diagnosis present

## 2020-01-18 DIAGNOSIS — Z5320 Procedure and treatment not carried out because of patient's decision for unspecified reasons: Secondary | ICD-10-CM | POA: Diagnosis not present

## 2020-01-18 DIAGNOSIS — Z7982 Long term (current) use of aspirin: Secondary | ICD-10-CM

## 2020-01-18 DIAGNOSIS — Z7901 Long term (current) use of anticoagulants: Secondary | ICD-10-CM

## 2020-01-18 DIAGNOSIS — Z7951 Long term (current) use of inhaled steroids: Secondary | ICD-10-CM

## 2020-01-18 DIAGNOSIS — R319 Hematuria, unspecified: Secondary | ICD-10-CM | POA: Diagnosis present

## 2020-01-18 DIAGNOSIS — E876 Hypokalemia: Secondary | ICD-10-CM

## 2020-01-18 DIAGNOSIS — G4733 Obstructive sleep apnea (adult) (pediatric): Secondary | ICD-10-CM | POA: Diagnosis present

## 2020-01-18 DIAGNOSIS — J9621 Acute and chronic respiratory failure with hypoxia: Secondary | ICD-10-CM | POA: Diagnosis present

## 2020-01-18 LAB — COMPREHENSIVE METABOLIC PANEL
ALT: 36 U/L (ref 0–44)
AST: 28 U/L (ref 15–41)
Albumin: 3.1 g/dL — ABNORMAL LOW (ref 3.5–5.0)
Alkaline Phosphatase: 87 U/L (ref 38–126)
Anion gap: 11 (ref 5–15)
BUN: 11 mg/dL (ref 8–23)
CO2: 27 mmol/L (ref 22–32)
Calcium: 8.4 mg/dL — ABNORMAL LOW (ref 8.9–10.3)
Chloride: 101 mmol/L (ref 98–111)
Creatinine, Ser: 0.71 mg/dL (ref 0.61–1.24)
GFR, Estimated: >60 mL/min (ref >60)
Glucose, Bld: 153 mg/dL — ABNORMAL HIGH (ref 70–99)
Potassium: 4.1 mmol/L (ref 3.5–5.1)
Sodium: 139 mmol/L (ref 135–145)
Total Bilirubin: 0.3 mg/dL (ref 0.3–1.2)
Total Protein: 7.6 g/dL (ref 6.5–8.1)

## 2020-01-18 LAB — CBC WITH DIFFERENTIAL/PLATELET
Abs Immature Granulocytes: 0.09 10*3/uL — ABNORMAL HIGH (ref 0.00–0.07)
Basophils Absolute: 0.1 10*3/uL (ref 0.0–0.1)
Basophils Relative: 1 %
Eosinophils Absolute: 0.1 10*3/uL (ref 0.0–0.5)
Eosinophils Relative: 1 %
HCT: 42 % (ref 39.0–52.0)
Hemoglobin: 12.9 g/dL — ABNORMAL LOW (ref 13.0–17.0)
Immature Granulocytes: 1 %
Lymphocytes Relative: 18 %
Lymphs Abs: 1.9 10*3/uL (ref 0.7–4.0)
MCH: 29.5 pg (ref 26.0–34.0)
MCHC: 30.7 g/dL (ref 30.0–36.0)
MCV: 96.1 fL (ref 80.0–100.0)
Monocytes Absolute: 1.5 10*3/uL — ABNORMAL HIGH (ref 0.1–1.0)
Monocytes Relative: 14 %
Neutro Abs: 7 10*3/uL (ref 1.7–7.7)
Neutrophils Relative %: 65 %
Platelets: 275 10*3/uL (ref 150–400)
RBC: 4.37 MIL/uL (ref 4.22–5.81)
RDW: 15 % (ref 11.5–15.5)
WBC: 10.6 10*3/uL — ABNORMAL HIGH (ref 4.0–10.5)
nRBC: 0 % (ref 0.0–0.2)

## 2020-01-18 LAB — LACTIC ACID, PLASMA
Lactic Acid, Venous: 1 mmol/L (ref 0.5–1.9)
Lactic Acid, Venous: 0.9 mmol/L (ref 0.5–1.9)

## 2020-01-18 LAB — GLUCOSE, CAPILLARY: Glucose-Capillary: 131 mg/dL — ABNORMAL HIGH (ref 70–99)

## 2020-01-18 LAB — BRAIN NATRIURETIC PEPTIDE: B Natriuretic Peptide: 139.4 pg/mL — ABNORMAL HIGH (ref 0.0–100.0)

## 2020-01-18 LAB — PROTIME-INR
INR: 3.4 — ABNORMAL HIGH (ref 0.8–1.2)
Prothrombin Time: 33 seconds — ABNORMAL HIGH (ref 11.4–15.2)

## 2020-01-18 LAB — RESP PANEL BY RT PCR (RSV, FLU A&B, COVID)
Influenza A by PCR: NEGATIVE
Influenza B by PCR: NEGATIVE
Respiratory Syncytial Virus by PCR: NEGATIVE
SARS Coronavirus 2 by RT PCR: NEGATIVE

## 2020-01-18 MED ORDER — PNEUMOCOCCAL VAC POLYVALENT 25 MCG/0.5ML IJ INJ
0.5000 mL | INJECTION | INTRAMUSCULAR | Status: DC
  Filled 2020-01-18: qty 0.5

## 2020-01-18 MED ORDER — ALBUTEROL SULFATE HFA 108 (90 BASE) MCG/ACT IN AERS
1.0000 | INHALATION_SPRAY | Freq: Four times a day (QID) | RESPIRATORY_TRACT | Status: DC | PRN
  Administered 2020-01-22: 1 via RESPIRATORY_TRACT
  Filled 2020-01-18: qty 6.7

## 2020-01-18 MED ORDER — IPRATROPIUM-ALBUTEROL 0.5-2.5 (3) MG/3ML IN SOLN
3.0000 mL | Freq: Once | RESPIRATORY_TRACT | Status: AC
  Administered 2020-01-18: 3 mL via RESPIRATORY_TRACT
  Filled 2020-01-18: qty 3

## 2020-01-18 MED ORDER — ONDANSETRON HCL 4 MG PO TABS: 4.0000 mg | ORAL_TABLET | Freq: Four times a day (QID) | ORAL | Status: DC | PRN

## 2020-01-18 MED ORDER — SODIUM CHLORIDE 0.9 % IV SOLN
1.0000 g | INTRAVENOUS | Status: DC
  Administered 2020-01-19 – 2020-01-21 (×3): 1 g via INTRAVENOUS
  Filled 2020-01-18: qty 1
  Filled 2020-01-18: qty 10
  Filled 2020-01-18: qty 1
  Filled 2020-01-18 (×2): qty 10

## 2020-01-18 MED ORDER — IPRATROPIUM-ALBUTEROL 0.5-2.5 (3) MG/3ML IN SOLN
3.0000 mL | Freq: Four times a day (QID) | RESPIRATORY_TRACT | Status: DC
  Administered 2020-01-18 – 2020-01-19 (×5): 3 mL via RESPIRATORY_TRACT
  Filled 2020-01-18 (×5): qty 3

## 2020-01-18 MED ORDER — ACETAMINOPHEN 325 MG PO TABS
650.0000 mg | ORAL_TABLET | Freq: Once | ORAL | Status: AC
  Administered 2020-01-18: 650 mg via ORAL
  Filled 2020-01-18: qty 2

## 2020-01-18 MED ORDER — DEXAMETHASONE SODIUM PHOSPHATE 10 MG/ML IJ SOLN
10.0000 mg | Freq: Once | INTRAMUSCULAR | Status: AC
  Administered 2020-01-18: 10 mg via INTRAVENOUS
  Filled 2020-01-18: qty 1

## 2020-01-18 MED ORDER — METOPROLOL TARTRATE 12.5 MG HALF TABLET
12.5000 mg | ORAL_TABLET | Freq: Two times a day (BID) | ORAL | Status: DC
  Administered 2020-01-18 – 2020-01-22 (×8): 12.5 mg via ORAL
  Filled 2020-01-18 (×8): qty 1

## 2020-01-18 MED ORDER — DULOXETINE HCL 60 MG PO CPEP
60.0000 mg | ORAL_CAPSULE | Freq: Every day | ORAL | Status: DC
  Administered 2020-01-19 – 2020-01-22 (×4): 60 mg via ORAL
  Filled 2020-01-18 (×4): qty 1

## 2020-01-18 MED ORDER — TOPIRAMATE 25 MG PO TABS
50.0000 mg | ORAL_TABLET | Freq: Two times a day (BID) | ORAL | Status: DC
  Administered 2020-01-18 – 2020-01-22 (×8): 50 mg via ORAL
  Filled 2020-01-18 (×8): qty 2

## 2020-01-18 MED ORDER — SODIUM CHLORIDE 0.9 % IV SOLN
500.0000 mg | Freq: Once | INTRAVENOUS | Status: AC
  Administered 2020-01-18: 500 mg via INTRAVENOUS
  Filled 2020-01-18: qty 500

## 2020-01-18 MED ORDER — INFLUENZA VAC SPLIT QUAD 0.5 ML IM SUSY
0.5000 mL | PREFILLED_SYRINGE | INTRAMUSCULAR | Status: AC
  Administered 2020-01-22: 0.5 mL via INTRAMUSCULAR
  Filled 2020-01-18: qty 0.5

## 2020-01-18 MED ORDER — ACETAMINOPHEN 325 MG PO TABS
650.0000 mg | ORAL_TABLET | Freq: Four times a day (QID) | ORAL | Status: DC | PRN
  Administered 2020-01-19 – 2020-01-21 (×2): 650 mg via ORAL
  Filled 2020-01-18 (×2): qty 2

## 2020-01-18 MED ORDER — ROSUVASTATIN CALCIUM 5 MG PO TABS
10.0000 mg | ORAL_TABLET | Freq: Every evening | ORAL | Status: DC
  Administered 2020-01-18 – 2020-01-22 (×5): 10 mg via ORAL
  Filled 2020-01-18 (×5): qty 2

## 2020-01-18 MED ORDER — ASPIRIN EC 81 MG PO TBEC
81.0000 mg | DELAYED_RELEASE_TABLET | Freq: Every day | ORAL | Status: DC
  Administered 2020-01-18 – 2020-01-22 (×5): 81 mg via ORAL
  Filled 2020-01-18 (×5): qty 1

## 2020-01-18 MED ORDER — POLYETHYLENE GLYCOL 3350 17 G PO PACK
17.0000 g | PACK | Freq: Every day | ORAL | Status: DC | PRN
  Administered 2020-01-21: 17 g via ORAL
  Filled 2020-01-18: qty 1

## 2020-01-18 MED ORDER — TAMSULOSIN HCL 0.4 MG PO CAPS
0.4000 mg | ORAL_CAPSULE | Freq: Every day | ORAL | Status: DC
  Administered 2020-01-19 – 2020-01-22 (×4): 0.4 mg via ORAL
  Filled 2020-01-18 (×4): qty 1

## 2020-01-18 MED ORDER — ONDANSETRON HCL 4 MG/2ML IJ SOLN: 4.0000 mg | Freq: Four times a day (QID) | INTRAMUSCULAR | Status: DC | PRN

## 2020-01-18 MED ORDER — FUROSEMIDE 10 MG/ML IJ SOLN
20.0000 mg | Freq: Once | INTRAMUSCULAR | Status: AC
  Administered 2020-01-18: 20 mg via INTRAVENOUS
  Filled 2020-01-18: qty 2

## 2020-01-18 MED ORDER — GABAPENTIN 300 MG PO CAPS
600.0000 mg | ORAL_CAPSULE | Freq: Three times a day (TID) | ORAL | Status: DC
  Administered 2020-01-18 – 2020-01-22 (×12): 600 mg via ORAL
  Filled 2020-01-18 (×12): qty 2

## 2020-01-18 MED ORDER — ACETAMINOPHEN 650 MG RE SUPP: 650.0000 mg | Freq: Four times a day (QID) | RECTAL | Status: DC | PRN

## 2020-01-18 MED ORDER — INSULIN ASPART 100 UNIT/ML ~~LOC~~ SOLN
0.0000 [IU] | Freq: Three times a day (TID) | SUBCUTANEOUS | Status: DC
  Administered 2020-01-19: 2 [IU] via SUBCUTANEOUS
  Administered 2020-01-19 – 2020-01-20 (×3): 3 [IU] via SUBCUTANEOUS
  Administered 2020-01-21 (×2): 2 [IU] via SUBCUTANEOUS
  Administered 2020-01-22 (×2): 3 [IU] via SUBCUTANEOUS
  Administered 2020-01-22: 2 [IU] via SUBCUTANEOUS

## 2020-01-18 MED ORDER — AZITHROMYCIN 250 MG PO TABS
250.0000 mg | ORAL_TABLET | ORAL | Status: AC
  Administered 2020-01-19 – 2020-01-22 (×4): 250 mg via ORAL
  Filled 2020-01-18 (×4): qty 1

## 2020-01-18 MED ORDER — FUROSEMIDE 10 MG/ML IJ SOLN
20.0000 mg | Freq: Once | INTRAMUSCULAR | Status: AC
  Administered 2020-01-18: 20 mg via INTRAVENOUS
  Filled 2020-01-18 (×2): qty 2

## 2020-01-18 MED ORDER — SODIUM CHLORIDE 0.9 % IV SOLN
1.0000 g | Freq: Once | INTRAVENOUS | Status: AC
  Administered 2020-01-18: 1 g via INTRAVENOUS
  Filled 2020-01-18: qty 10

## 2020-01-18 MED ORDER — ALBUTEROL SULFATE HFA 108 (90 BASE) MCG/ACT IN AERS
4.0000 | INHALATION_SPRAY | Freq: Once | RESPIRATORY_TRACT | Status: AC
  Administered 2020-01-18: 4 via RESPIRATORY_TRACT
  Filled 2020-01-18: qty 6.7

## 2020-01-18 NOTE — ED Triage Notes (Signed)
SOB X 5 days, on O2 at home. Increased from 2 L to 4LNC d/t worsening SOB. C/O "red sputum since the first".

## 2020-01-18 NOTE — H&P (Signed)
Date: 01/18/2020               Patient Name:  Jose [name] MRN: [id number]  DOB: [birth year] Age / Sex: 64 y.o., male   PCP: [name], MD         Medical Service: Internal Medicine Teaching Service         Attending Physician: Dr. Oswaldo Done, Marquita Palms, *    First Contact: Dr. Gerarda Fraction Pager: [phone]  Second Contact: Dr. 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 Patient wants to make sure he doesn't have COVID. He endorses cough for four days with "fair quantity" of bloody sputum. He notes that the sputum also has clots. He is a poor historian and rest of the history was collected from wife via telephone.  She states the patient has COPD for a while and he uses home oxygen as needed. However, over the past few months, he has required constant oxygen to 2L. She also endorses that he is peeing blood from last one month. He has been sleeping a lot, coughing a lot and has a poor appetite ( ate 1 bowl of chicken needle soup in last 2 days). 3-4 weeks ago fainted while opening door for [employer] guy, EMS was called but refused to go to hospital. No information on fall. States he is on seizure medications, blood thinners, BP medications, Gabapentin for back and is diabetic.   Meds: Home meds Aspirin 81 mg  Duloxetine 60 mg Metoprolol 12.5 mg Rosuvastatin 10 mg Tamsulosin 10 mg Topiramate 50 mg   Allergies: Allergies as of 01/18/2020 - Review Complete 01/18/2020  Allergen Reaction Noted  . Melatonin Anxiety 05/12/2012  . Ramipril  08/27/2011   Past Medical History:  Diagnosis Date  .  Antiphospholipid antibody syndrome (HCC)   . CAD S/P percutaneous coronary angioplasty '98, '04, Feb 2017   [facility]  . Carotid arterial disease (HCC) 10/2015   moderate bilateral  . COPD (chronic obstructive pulmonary disease) (HCC)   . Coronary artery disease   . Hypertension   . Non-insulin treated type 2 diabetes mellitus (HCC)   . Pulmonary emboli (HCC) 2011  . RBBB   . Sleep apnea     Family History: Daughter has Lupus  Social History: Lives with wife. 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 Unable to clarify smoking history, alcohol use or illicit drug use.   Review of Systems: A complete ROS was negative except as per HPI.   Physical Exam: Blood pressure (!) 164/95, pulse 95, temperature 99.1 F (37.3 C), temperature source Oral, resp. rate (!) 24, SpO2 97 %.   Physical Exam  Gen: Obese , NAD HEENT: NCAT head, hearing intact, EOMI, PERRL, Crusting on eyes Neck: supple, ROM intact, no JVD, no cervical adenopathy CV: RRR, S1, S2 normal, No rubs, no murmurs, no gallops Pulm: Tachypnea, diffuse expiratory wheezing and rales , moderate dyspnea; currently on 3L O2 Extremities: 2+ pitting edema of bilateral lower extremities to the knee Skin: Dry scales on lower extremities consistent with chronic  venous stasis  Neuro: Able to identify self, location, Motor strength 5/5 all limbs Psych: Anxious mood and affect  CBC    Component Value Date/Time   WBC 10.6 (H) 01/18/2020 1435   RBC 4.37 01/18/2020 1435   HGB 12.9 (L) 01/18/2020 1435   HCT 42.0 01/18/2020 1435   PLT 275 01/18/2020 1435   MCV 96.1 01/18/2020 1435   MCH 29.5 01/18/2020 1435   MCHC 30.7 01/18/2020 1435   RDW 15.0 01/18/2020 1435   LYMPHSABS 1.9 01/18/2020 1435   MONOABS 1.5 (H) 01/18/2020 1435   EOSABS 0.1 01/18/2020 1435   BASOSABS 0.1 01/18/2020 1435   BMP Latest Ref Rng & Units 01/18/2020 03/22/2019 03/22/2019  Glucose 70 - 99 mg/dL 169(C) 789(F) 810(F)  BUN 8 - 23 mg/dL 11 75(Z) 02(H)   Creatinine 0.61 - 1.24 mg/dL 8.52 7.78 2.42(P)  Sodium 135 - 145 mmol/L 139 134(L) 137  Potassium 3.5 - 5.1 mmol/L 4.1 4.5 4.1  Chloride 98 - 111 mmol/L 101 102 99  CO2 22 - 32 mmol/L 27 23 28   Calcium 8.9 - 10.3 mg/dL ) 5.3(I) 1.4(E)   BNP    Component Value Date/Time   BNP 139.4 (H) 01/18/2020 1435    EKG: RBBB, unchanged from prior   CXR: shows mild ill-defined opacity within the medial right lung base. Findings may reflect atelectasis or pneumonia, Unchanged cardiomegaly.  Assessment & Plan by Problem: Active Problems:   Community acquired pneumonia  Community acquired Pneumonia vs COPD exacerbation: Presented with cough with hemoptysis and dyspnea for four days duration. He has also been experiencing intermittent chest pains, fatigue/malaise and subjective fevers over the past several months. He has hx of advanced COPD for which he has had increasing oxygen requirements over the past several months. Noted to have temp of 99.74F on arrival. On examination, patient has diffuse expiratory wheezing and rales in all lung fields. His chest x-ray shows mild ill-defined opacity within the medial right lung base. Concerns for possible COPD exacerbation given his diffuse wheezing and increased sputum production vs community acquired pneumonia in setting of CXR findings. Although he endorses hemoptysis and does have history of PE; he is on chronic warfarin therapy with supratherapeutic INR. Unlikely that his hemoptysis is secondary to a PE at this time. However, Well's Criteria is 2.5 (history of PE and hemoptysis). Can obtain D-dimer for further stratification.  Patient started on azithromycin and ceftriaxone for community acquired pneumonia.  - Continue Azithromycin & Ceftriaxone - Duonebs q6h - D-dimer, if elevated, will consider CTA Chest to r/o PE - Continue albuterol inhaler q4h prn - F/u blood cultures  History of HFpEF History of CAD s/p CABG  Patient has Hx of CHF, CAD. Echo  from December 2020 noted to have EF 55-60% with grade I diastolic dysfunction. He is on torsemide 20mg  daily at home. His BNP are elevated to 139.4. He presents with progressive dyspnea and 2+ pitting edema to bilateral lower extremities. He received one dose of IV Lasix 20mg  in the ED. Currently, appears hypervolemic on examination - IV Lasix 20mg   -Strict I&O - Trend BMP - Resume home metoprolol 12.5mg  bid  - Crestor 20mg  daily - Aspirin 81mg  daily  Hematuria: Patient and wife are endorsing  blood in urine* 1 month. Denies any associated pain or other urinary symptoms. No abdominal tenderness noted on examination.He does have history of BPH for which he is on tamsulosin - UA analysis - Resume tamsulosin 0.4mg  daily  Dispo: Admit patient to Inpatient with  expected length of stay greater than 2 midnights.  Signed: Arnoldo Lenis, MD 01/18/2020, 8:27 PM  Pager: [phone] After 5pm on weekdays and 1pm on weekends: On Call pager: [phone]

## 2020-01-18 NOTE — Progress Notes (Signed)
Patient arrived to room 2w23 from ED.  Assessment complete, VS obtained, and Admission database began.

## 2020-01-18 NOTE — ED Notes (Signed)
Pt admitted to 2W23; report called to [facility], RN.

## 2020-01-18 NOTE — Progress Notes (Signed)
ANTICOAGULATION CONSULT NOTE - Initial Consult  Pharmacy Consult for Warfarin  Indication: History of pulmonary embolus  Allergies  Allergen Reactions  . Melatonin Anxiety  . Ramipril     cough   Vital Signs: Temp: 98.9 F (37.2 C) (11/04 2143) Temp Source: Oral (11/04 2143) BP: 155/78 (11/04 2143) Pulse Rate: 91 (11/04 2143)  Labs: Recent Labs    01/18/20 1435 01/18/20 1512  HGB 12.9*  --   HCT 42.0  --   PLT 275  --   LABPROT  --  33.0*  INR  --  3.4*  CREATININE 0.71  --     Estimated Creatinine Clearance: 116 mL/min (by C-G formula based on SCr of 0.71 mg/dL).   Medical History: Past Medical History:  Diagnosis Date  . Antiphospholipid antibody syndrome (HCC)   . CAD S/P percutaneous coronary angioplasty '98, '04, Feb 2017   [facility]  . Carotid arterial disease (HCC) 10/2015   moderate bilateral  . COPD (chronic obstructive pulmonary disease) (HCC)   . Coronary artery disease   . Hypertension   . Non-insulin treated type 2 diabetes mellitus (HCC)   . Pulmonary emboli (HCC) 2011  . RBBB   . Sleep apnea    Assessment: 64 y/o M presents to the ED with increasing shortness of breath, being admitted for CAP vs COPD exacerbation. On warfarin PTA for history of PE, INR is supra-therapeutic at 3.4. Of note, internal medicine MD note mentions pt reported hematuria at home. Hgb is ok at 12.9 and appears to be at his baseline for the last few years.  Goal of Therapy:  INR 2-3 Monitor platelets by anticoagulation protocol: Yes   Plan:  -No warfarin tonight -Daily INR, resume warfarin as INR allows -F/U pt reported hematuria, watch Hgb  Abran [facility], PharmD, BCPS Clinical Pharmacist Phone: [phone]

## 2020-01-18 NOTE — ED Notes (Signed)
Provider at bedside

## 2020-01-18 NOTE — Telephone Encounter (Signed)
Pt called back today.  Was not able to see his PCP for testing.  He is now feeling dizzy.  He was encouraged to call 911 to get evaluated by EMTs and get tested.

## 2020-01-18 NOTE — ED Provider Notes (Signed)
[facility] EMERGENCY DEPARTMENT Provider Note   CSN: [id number] Arrival date & time: 01/18/20  1357     History Chief Complaint  Patient presents with  . Shortness of Breath    frothy sputum, SOB X 5 days    Jose [name] is a 64 y.o. male.  HPI   pt is a 64 y/o male with a h/o APA, CAD, COPD, HTN, DM, pulmonary emboli, who presents to the ED for eval of shortness of breath. States that for the last 5 days he has had increased shortness of breath, coughing. He is coughing up pink frothy sputum. He has chronic BLE swelling and he is not sure if it is worse today.  He denies any chest pain.  He has had some increased nasal congestion which is abnormal for him.  Also reports that he lost his taste and smell. he has had no documented fevers at home.  Denies any vomiting, diarrhea.  Patient states he uses oxygen at night but does not normally use it throughout the day.  For the last several days he has had to use his oxygen at home during the day.  Past Medical History:  Diagnosis Date  . Antiphospholipid antibody syndrome ([place])   . CAD S/P percutaneous coronary angioplasty '98, '04, Feb 2017   [facility]  . Carotid arterial disease ([place]) 10/2015   moderate bilateral  . COPD (chronic obstructive pulmonary disease) ([place])   . Coronary artery disease   . Hypertension   . Non-insulin treated type 2 diabetes mellitus ([place])   . Pulmonary emboli ([place]) 2011  . RBBB   . Sleep apnea     Patient Active Problem List   Diagnosis Date Noted  . Abscess of left thigh   . Abscess 03/14/2019  . Coronary atherosclerosis 10/04/2017  . GERD (gastroesophageal reflux disease) 10/04/2017  . Hypertension 10/04/2017  . Pulmonary embolism ([place]) 10/04/2017  . Transient visual loss 10/04/2017  . 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 Nonrheumatic aortic valve stenosis 03/31/2017  . Pericarditis, Acute 02/12/2017  . Acute on chronic right heart failure ([place]) 02/11/2017  . Angina at rest [facility])  08/17/2016  . Unstable angina ([place]) 08/16/2016  . Tobacco abuse 08/16/2016  . 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 Hyperlipidemia 08/16/2016  . Essential hypertension, benign 08/16/2016  . COPD (chronic obstructive pulmonary disease) ([place]) 08/16/2016  . Sleep apnea 08/16/2016  . CAD S/P multiple PCIs 08/16/2016  . History of pulmonary embolus (PE)-2011 08/16/2016  . Chronic anticoagulation-Couamdin 08/16/2016  . Non-insulin treated type 2 diabetes mellitus ([place]) 08/16/2016  . Carotid artery disease ([place]) 08/16/2016  . RBBB (right bundle branch block) 08/16/2016  . Dyspnea on exertion 10/17/2015  . ED (erectile dysfunction) 08/09/2012    Past Surgical History:  Procedure Laterality Date  . APPENDECTOMY    . APPLICATION OF WOUND VAC Left 03/18/2019   Procedure: Application Of Wound Vac;  Surgeon: Newt Minion, MD;  Location: [facility];  Service: Orthopedics;  Laterality: Left;  . BACK SURGERY  '89, '06  . COLONOSCOPY    . CORONARY ANGIOPLASTY WITH STENT PLACEMENT  '98, '04, Feb 2017  . I & D EXTREMITY Left 03/15/2019   Procedure: DEBRIDEMENT LEFT THIGH;  Surgeon: Newt Minion, MD;  Location: [facility];  Service: Orthopedics;  Laterality: Left;  . INCISION AND DRAINAGE OF WOUND Left 03/18/2019   Procedure: LEFT THIGH DEBRIDEMENT;  Surgeon: Newt Minion, MD;  Location: [facility];  Service: Orthopedics;  Laterality: Left;  Family History  Problem Relation Age of Onset  . CAD Father 18    Social History   Tobacco Use  . Smoking status: Current Every Day Smoker    Packs/day: 0.50    Years: 40.00    Pack years: 20.00    Types: Cigarettes    Last attempt to quit: 01/15/2019    Years since quitting: 1.0  . Smokeless tobacco: Never Used  . Tobacco comment: has started smoking again, willing to quit.  Vaping Use  . Vaping Use: Never used  Substance Use Topics  . Alcohol use: No  . Drug use: No    Home Medications Prior to Admission medications   Medication Sig  Start Date End Date Taking? Authorizing Provider  albuterol (PROVENTIL) (2.5 MG/3ML) 0.083% nebulizer solution PLEASE SEE ATTACHED FOR DETAILED DIRECTIONS 11/14/19   [provider]  albuterol (PROVENTIL) (5 MG/ML) 0.5% nebulizer solution Take 0.5 mLs (2.5 mg total) by nebulization every 6 (six) hours as needed for wheezing or shortness of breath. 12/27/18   Wyvonnia Dusky, MD  aspirin EC 81 MG tablet Take 81 mg by mouth daily. Patient not taking: Reported on 01/08/2020    [provider]  Blood Glucose Monitoring Suppl (CONTOUR NEXT MONITOR) w/Device KIT by Does not apply route. 10/16/19   [provider]  Blood Glucose Monitoring Suppl ([place]) [place] 1 each by XX route as directed (For Blood Glucose Monitoring) 08/23/17   [provider]  doxycycline (VIBRA-TABS) 100 MG tablet Take 1 tablet (100 mg total) by mouth every 12 (twelve) hours. 03/22/19   Mikhail, Velta Addison, DO  DULoxetine (CYMBALTA) 60 MG capsule Take by mouth. 05/03/19   [provider]  enoxaparin (LOVENOX) 120 MG/0.8ML injection Inject 0.73 mLs (110 mg total) into the skin every 12 (twelve) hours for 7 days. 03/22/19 03/29/19  Mikhail, Velta Addison, DO  Fluticasone Propionate, Inhal, 100 MCG/BLIST AEPB Inhale 1 puff into the lungs 2 (two) times daily.    [provider]  gabapentin (NEURONTIN) 300 MG capsule Take 600 mg by mouth 3 (three) times daily.     [provider]  glucose blood (CONTOUR NEXT TEST) test strip Use to check blood sugar twice 2 time(s) daily. 10/16/19 10/15/20  [provider]  HYDROcodone-acetaminophen (NORCO) 10-325 MG tablet Take 1 tablet by mouth 2 (two) times daily. Patient not taking: Reported on 01/08/2020    [provider]  ipratropium-albuterol (DUONEB) 0.5-2.5 (3) MG/3ML SOLN Inhale into the lungs. 12/27/19 12/21/20  [provider]  Lancets (ACCU-CHEK SOFT TOUCH) lancets 2 (two) times daily. 10/16/19 10/15/20   [provider]  metoprolol tartrate (LOPRESSOR) 12.5 mg TABS tablet Take 12.5 mg by mouth 2 (two) times daily.  08/22/17 03/14/19  [provider]  nicotine (NICODERM CQ - DOSED IN MG/24 HOURS) 14 mg/24hr patch Apply one patch daily x 2 weeks.  Begin after completion of 21 mg patches 10/06/19   [provider]  nicotine (NICODERM CQ - DOSED IN MG/24 HOURS) 21 mg/24hr patch 1 patch daily. 10/06/19   [provider]  nicotine (NICODERM CQ - DOSED IN MG/24 HR) 7 mg/24hr patch Apply one patch daily x 2 weeks.  Begin after completion of 14 mg patches 10/06/19   [provider]  nicotine polacrilex (NICORETTE) 4 MG gum Take by mouth. 10/06/19   [provider]  nitroGLYCERIN (NITROSTAT) 0.4 MG SL tablet Place 0.4 mg under the tongue every 5 (five) minutes as needed for chest pain.  07/07/16   [provider]  pantoprazole (PROTONIX) 40 MG tablet Take 40 mg by mouth 2 (two) times daily. 06/03/16   [provider]  PROAIR HFA 108 (90 Base) MCG/ACT inhaler Inhale 1-2 puffs into the lungs every 4 (four) hours as needed for wheezing.  07/07/16   [provider]  rosuvastatin (CRESTOR) 10 MG tablet Take 10 mg by mouth every evening. 07/07/16   [provider]  Semaglutide, 1 MG/DOSE, (OZEMPIC, 1 MG/DOSE,) 4 MG/3ML SOPN INJECT 1 MILLIGRAM SUBCUTANEOUSLY ONCE A WEEK 10/13/19   [provider]  tamsulosin (FLOMAX) 0.4 MG CAPS capsule Take 0.4 mg by mouth daily. 06/05/16   [provider]  Tiotropium Bromide-Olodaterol (STIOLTO RESPIMAT) 2.5-2.5 MCG/ACT AERS Inhale 2 puffs into the lungs daily.  09/10/17   [provider]  tiZANidine (ZANAFLEX) 4 MG tablet Take 4 mg by mouth 3 (three) times daily.    [provider]  topiramate (TOPAMAX) 50 MG tablet Take 50 mg by mouth 2 (two) times daily. 06/05/16   [provider]  torsemide (DEMADEX) 20 MG tablet Take 40 mg by mouth daily.    [provider]  warfarin (COUMADIN) 5 MG tablet Take 5-6 mg by mouth daily. 5 mg daily, except 6 mg on Mondays and Fridays 07/17/16   [provider]    Allergies    Melatonin and Ramipril  Review of Systems   Review of Systems  Constitutional: Negative for chills and fever.       Loss of taste and smell  HENT: Positive for congestion. Negative for ear pain and sore throat.   Eyes: Negative for visual disturbance.  Respiratory: Positive for cough and shortness of breath.   Cardiovascular: Positive for leg swelling. Negative for chest pain.  Gastrointestinal: Negative for abdominal pain, constipation, diarrhea, nausea and vomiting.  Genitourinary: Negative for dysuria and hematuria.  Musculoskeletal: Negative for back pain.  Skin: Negative for rash.  Neurological: Negative for headaches.  All other systems reviewed and are negative.   Physical Exam Updated Vital Signs BP 109/60   Pulse 89   Temp 99.1 F (37.3 C) (Oral)   Resp (!) 21   SpO2 94%   Physical Exam Vitals and nursing note reviewed.  Constitutional:      Appearance: He is well-developed.  HENT:     Head: Normocephalic and atraumatic.  Eyes:     Conjunctiva/sclera: Conjunctivae normal.  Cardiovascular:     Rate and Rhythm: Normal rate and regular rhythm.     Heart sounds: Normal heart sounds. No murmur heard.   Pulmonary:     Effort: Tachypnea present. No respiratory distress.     Breath sounds: Wheezing and rales present.     Comments: Dyspnea with conversation Abdominal:     General: Bowel sounds are normal.     Palpations: Abdomen is soft.     Tenderness: There is no abdominal tenderness. There is no guarding or rebound.  Musculoskeletal:     Cervical back: Neck supple.     Right lower leg: Edema present.     Left lower leg: Edema present.  Skin:    General: Skin is warm and dry.  Neurological:     Mental Status: He is alert.     ED Results / Procedures / Treatments   Labs (all labs  ordered are listed, but only abnormal results are displayed) Labs Reviewed  CBC WITH DIFFERENTIAL/PLATELET - Abnormal; Notable for the following components:      Result  Value   WBC 10.6 (*)    Hemoglobin 12.9 (*)    Monocytes Absolute 1.5 (*)    Abs Immature Granulocytes 0.09 (*)    All other components within normal limits  COMPREHENSIVE METABOLIC PANEL - Abnormal; Notable for the following components:   Glucose, Bld 153 (*)    Calcium 8.4 (*)    Albumin 3.1 (*)    All other components within normal limits  BRAIN NATRIURETIC PEPTIDE - Abnormal; Notable for the following components:   B Natriuretic Peptide 139.4 (*)    All other components within normal limits  PROTIME-INR - Abnormal; Notable for the following components:   Prothrombin Time 33.0 (*)    INR 3.4 (*)    All other components within normal limits  RESP PANEL BY RT PCR (RSV, FLU A&B, COVID)  CULTURE, BLOOD (ROUTINE X 2)  CULTURE, BLOOD (ROUTINE X 2)  LACTIC ACID, PLASMA  LACTIC ACID, PLASMA    EKG EKG Interpretation  Date/Time:  Thursday January 18 2020 14:01:36 EDT Ventricular Rate:  98 PR Interval:    QRS Duration: 164 QT Interval:  410 QTC Calculation: 524 R Axis:   93 Text Interpretation: Sinus rhythm Probable left atrial enlargement Right bundle branch block similar to 2020 Confirmed by Sherwood Gambler [id number]) on 01/18/2020 3:48:06 PM   Radiology DG Chest Portable 1 View  Result Date: 01/18/2020 CLINICAL DATA:  Shortness of breath. EXAM: PORTABLE CHEST 1 VIEW COMPARISON:  CT angiogram chest 12/27/2018. Chest radiographs 12/27/2018. FINDINGS: Prior median sternotomy. Cardiomegaly. Mild ill-defined opacity within the medial right lung base. The lungs are otherwise clear. No evidence of pleural effusion or pneumothorax. No acute bony abnormality identified IMPRESSION: Mild ill-defined opacity within the medial right lung base. Findings may reflect atelectasis or pneumonia. PA and lateral view chest radiograph  with improved inspiratory effort may be helpful for further evaluation. Unchanged cardiomegaly. Electronically Signed   By: Kellie Simmering DO   On: 01/18/2020 14:49    Procedures Procedures (including critical care time)   CRITICAL CARE Performed by: Rodney Booze   Total critical care time: 33 minutes  Critical care time was exclusive of separately billable procedures and treating other patients.  Critical care was necessary to treat or prevent imminent or life-threatening deterioration.  Critical care was time spent personally by me on the following activities: development of treatment plan with patient and/or surrogate as well as nursing, discussions with consultants, evaluation of patient's response to treatment, examination of patient, obtaining history from patient or surrogate, ordering and performing treatments and interventions, ordering and review of laboratory studies, ordering and review of radiographic studies, pulse oximetry and re-evaluation of patient's condition.   Medications Ordered in ED Medications  azithromycin (ZITHROMAX) 500 mg in sodium chloride 0.9 % 250 mL IVPB (has no administration in time range)  acetaminophen (TYLENOL) tablet 650 mg (650 mg Oral Given 01/18/20 1445)  albuterol (VENTOLIN HFA) 108 (90 Base) MCG/ACT inhaler 4 puff (4 puffs Inhalation Given 01/18/20 1445)  dexamethasone (DECADRON) injection 10 mg (10 mg Intravenous Given 01/18/20 1445)  cefTRIAXone (ROCEPHIN) 1 g in sodium chloride 0.9 % 100 mL IVPB (1 g Intravenous New Bag/Given 01/18/20 1640)  furosemide (LASIX) injection 20 mg (20 mg Intravenous Given 01/18/20 1652)  ipratropium-albuterol (DUONEB) 0.5-2.5 (3) MG/3ML nebulizer solution 3 mL (3 mLs Nebulization Given 01/18/20 1658)    ED Course  I have reviewed the triage vital signs and the nursing notes.  Pertinent labs & imaging results that were available during my  care of the patient were reviewed by me and considered in my medical  decision making (see chart for details).    MDM Rules/Calculators/A&P                          64 year old male presenting emergency department today for evaluation of increased cough, shortness of breath for the last 5 days.  On evaluation initial temp was 99 1.  On my repeat it was 100 point 27F orally.  He is hypoxic on room air and requiring 4 to 5 L O2.  Reviewed/interpreted labs CBC shows a mild leukocytosis of 10.6, mild anemia appears chronic CMP with normal LFTs and creatinine Covid/flu/RSV negative Lactic acid negative Blood cultures obtained BNP marginally elevated  EKG with NSR, RBBB, LAE, similar to prior  CXR reviewed/interpreted - Mild ill-defined opacity within the medial right lung base. Findings may reflect atelectasis or pneumonia. PA and lateral view chest radiograph with improved inspiratory effort may be helpful for further evaluation. Unchanged cardiomegaly.  Patient was given steroids, albuterol in the ED.  Also given antibiotics.  Given elevated BNP concern for superimposed acute exacerbation of chronic heart failure therefore IV Lasix was given as well.  Will admit for further tx of acute hypoxic respiratory failure.   5:04 PM CONSULT with Dr. Delrae Rend with the internal medicine team who accepts patient for admission.   Final Clinical Impression(s) / ED Diagnoses Final diagnoses:  Community acquired pneumonia of right lung, unspecified part of lung    Rx / DC Orders ED Discharge Orders    None       Bishop [place] 01/18/20 1717    Isla Pence, MD 01/18/20 1736

## 2020-01-19 ENCOUNTER — Encounter (HOSPITAL_COMMUNITY): Payer: Self-pay | Admitting: Student in an Organized Health Care Education/Training Program

## 2020-01-19 DIAGNOSIS — J449 Chronic obstructive pulmonary disease, unspecified: Secondary | ICD-10-CM

## 2020-01-19 DIAGNOSIS — E669 Obesity, unspecified: Secondary | ICD-10-CM

## 2020-01-19 DIAGNOSIS — D6861 Antiphospholipid syndrome: Secondary | ICD-10-CM

## 2020-01-19 DIAGNOSIS — J9621 Acute and chronic respiratory failure with hypoxia: Secondary | ICD-10-CM

## 2020-01-19 DIAGNOSIS — I5033 Acute on chronic diastolic (congestive) heart failure: Secondary | ICD-10-CM

## 2020-01-19 DIAGNOSIS — E119 Type 2 diabetes mellitus without complications: Secondary | ICD-10-CM

## 2020-01-19 DIAGNOSIS — J189 Pneumonia, unspecified organism: Principal | ICD-10-CM

## 2020-01-19 DIAGNOSIS — R319 Hematuria, unspecified: Secondary | ICD-10-CM

## 2020-01-19 LAB — PROCALCITONIN: Procalcitonin: 0.14 ng/mL

## 2020-01-19 LAB — COMPREHENSIVE METABOLIC PANEL
ALT: 32 U/L (ref 0–44)
AST: 21 U/L (ref 15–41)
Albumin: 2.9 g/dL — ABNORMAL LOW (ref 3.5–5.0)
Alkaline Phosphatase: 86 U/L (ref 38–126)
Anion gap: 9 (ref 5–15)
BUN: 10 mg/dL (ref 8–23)
CO2: 30 mmol/L (ref 22–32)
Calcium: 8.5 mg/dL — ABNORMAL LOW (ref 8.9–10.3)
Chloride: 101 mmol/L (ref 98–111)
Creatinine, Ser: 0.7 mg/dL (ref 0.61–1.24)
GFR, Estimated: >60 mL/min (ref >60)
Glucose, Bld: 124 mg/dL — ABNORMAL HIGH (ref 70–99)
Potassium: 3.7 mmol/L (ref 3.5–5.1)
Sodium: 140 mmol/L (ref 135–145)
Total Bilirubin: 0.6 mg/dL (ref 0.3–1.2)
Total Protein: 7.3 g/dL (ref 6.5–8.1)

## 2020-01-19 LAB — CBC
HCT: 40.3 % (ref 39.0–52.0)
Hemoglobin: 12.6 g/dL — ABNORMAL LOW (ref 13.0–17.0)
MCH: 29.6 pg (ref 26.0–34.0)
MCHC: 31.3 g/dL (ref 30.0–36.0)
MCV: 94.8 fL (ref 80.0–100.0)
Platelets: 321 10*3/uL (ref 150–400)
RBC: 4.25 MIL/uL (ref 4.22–5.81)
RDW: 15 % (ref 11.5–15.5)
WBC: 10.3 10*3/uL (ref 4.0–10.5)
nRBC: 0 % (ref 0.0–0.2)

## 2020-01-19 LAB — PROTIME-INR
INR: 3 — ABNORMAL HIGH (ref 0.8–1.2)
Prothrombin Time: 30.4 seconds — ABNORMAL HIGH (ref 11.4–15.2)

## 2020-01-19 LAB — GLUCOSE, CAPILLARY
Glucose-Capillary: 156 mg/dL — ABNORMAL HIGH (ref 70–99)
Glucose-Capillary: 119 mg/dL — ABNORMAL HIGH (ref 70–99)
Glucose-Capillary: 114 mg/dL — ABNORMAL HIGH (ref 70–99)

## 2020-01-19 LAB — HEMOGLOBIN A1C
Hgb A1c MFr Bld: 8.7 % — ABNORMAL HIGH (ref 4.8–5.6)
Mean Plasma Glucose: 202.99 mg/dL

## 2020-01-19 LAB — D-DIMER, QUANTITATIVE: D-Dimer, Quant: <0.27 ug/mL-FEU (ref 0.00–0.50)

## 2020-01-19 MED ORDER — WARFARIN SODIUM 2.5 MG PO TABS
2.5000 mg | ORAL_TABLET | Freq: Once | ORAL | Status: AC
  Administered 2020-01-19: 2.5 mg via ORAL
  Filled 2020-01-19: qty 1

## 2020-01-19 MED ORDER — FUROSEMIDE 10 MG/ML IJ SOLN
40.0000 mg | Freq: Two times a day (BID) | INTRAMUSCULAR | Status: DC
  Administered 2020-01-19 – 2020-01-21 (×6): 40 mg via INTRAVENOUS
  Filled 2020-01-19 (×6): qty 4

## 2020-01-19 MED ORDER — WARFARIN - PHARMACIST DOSING INPATIENT: Freq: Every day | Status: DC

## 2020-01-19 NOTE — Progress Notes (Addendum)
Subjective: Jose [name] is sitting comfortably in chair. States doing okay and asked for his COVID resting results, although was informed they were negative last evening. Appetite good, slept okay. Complains of cough but states did not produce any sputum this morning. Also, complains of blood in urine. Wants to get better soon and discharge to home. Informed about pneumonia and ongoing t/t, asks about starting neomycin.   Objective:  Vital signs in last 24 hours: Vitals:   01/19/20 0431 01/19/20 0505 01/19/20 0801 01/19/20 0859  BP: 117/64 (!) 138/98  (!) 145/71  Pulse: 84 83  83  Resp: 20 17    Temp: 97.8 F (36.6 C) 98.3 F (36.8 C)  98.1 F (36.7 C)  TempSrc: Oral Oral  Oral  SpO2: 96% 100% 98% 97%  Height:       CBC Latest Ref Rng & Units 01/19/2020 01/18/2020 03/22/2019  WBC 4.0 - 10.5 K/uL 10.3 10.6(H) 12.9(H)  Hemoglobin 13.0 - 17.0 g/dL 12.6(L) 12.9(L) 10.2(L)  Hematocrit 39 - 52 % 40.3 42.0 31.8(L)  Platelets 150 - 400 K/uL 321 275 316   BMP Latest Ref Rng & Units 01/19/2020 01/18/2020 03/22/2019  Glucose 70 - 99 mg/dL 742(V) 956(L) 875(I)  BUN 8 - 23 mg/dL 10 11 43(P)  Creatinine 0.61 - 1.24 mg/dL 2.95 1.88 4.16  Sodium 135 - 145 mmol/L 140 139 134(L)  Potassium 3.5 - 5.1 mmol/L 3.7 4.1 4.5  Chloride 98 - 111 mmol/L 101 101 102  CO2 22 - 32 mmol/L 30 27 23   Calcium 8.9 - 10.3 mg/dL ) 6.0(Y) 3.0(Z)   Gen: Obese , NAD HEENT: NCAT head, hearing intact, EOMI, PERRL, Crusting on eyes Pulm: Tachypnea, diffuse expiratory wheezing and rales , moderate dyspnea; currently on 3L O2 Extremities: 2+ pitting edema of bilateral lower extremities to the knee Skin: Dry scales on lower extremities consistent with chronic venous stasis  Neuro: Able to identify self, location Psych: Anxious mood and affect Assessment/Plan:  Principal Problem:   Community acquired pneumonia Active Problems:   Anti-phospholipid syndrome (HCC)   COPD (chronic obstructive pulmonary disease)  (HCC)   Non-insulin treated type 2 diabetes mellitus (HCC)   Acute on chronic right heart failure (HCC)  Acute on chronic hypoxic respiratory failure due to community-acquired pneumonia: Presented with cough with hemoptysis and dyspnea for four days duration. He has also been experiencing intermittent chest pains, fatigue/malaise and subjective fevers over the past several months. He has hx of advanced COPD for which he has had increasing oxygen requirements over the past several months. Afebrile today. On examination, has diffuse expiratory wheezing and rales in all lung fields. His chest x-ray shows mild ill-defined opacity within the medial right lung base. Although he endorses hemoptysis and does have history of PE; he is on chronic warfarin therapy with supratherapeutic INR. Unlikely that his hemoptysis is secondary to a PE at this time, his D-dimer is undetectable. Anticoagulation with warfarin is on hold for now.  INR this morning was 3.0, as the INR drifts downwards, would start him on heparin infusion when the INR goes below 2.0 given his very increased risk for VTE. He also seems confused/ acute metabolic encephalopathy, planning to treat supportively with delirium precautions and anticipate that this will improve as the pneumonia is treated.   - Continue Azithromycin & Ceftriaxone - Duonebs q6h - Continue albuterol inhaler q4h pr -PT/OT consult  Acute on chronic heart failure with preserved ejection fraction [name] has Hx of CHF, CAD. Echo from December 2020  noted to have EF 55-60% with grade I diastolic dysfunction. He is on torsemide 20mg  daily at home. His BNP are elevated to 139.4. He presents with progressive dyspnea and 2+ pitting edema to bilateral lower extremities.  Currently, appears hypervolemic on examination - IV Lasix 40mg   -Strict I&O - Trend BMP - Resume home metoprolol 12.5mg  bid  - Crestor 20mg  daily - Aspirin 81mg  daily  Antiphospholipid antibody syndrome No  evidence of active venous thromboembolisms.  However he is at high risk given his underlying disorder and current hospitalization.  INR was supratherapeutic and having some hemoptysis and hematuria so Coumadin is on hold.  INR was 3.0 today.  Will allow INR to trend down slowly, no vitamin K for now unless he has worsening bleeding.  If INR goes below 2.0, and as long as he is not having major bleeding, can start bridging therapy with heparin infusion and restart Coumadin.  Hematuria: [name] and wife are endorsing  blood in urine* 1 month. Denies any associated pain or other urinary symptoms. No abdominal tenderness noted on examination.He does have history of BPH for which he is on tamsulosin - UA analysis -  tamsulosin 0.4mg  daily   Prior to Admission Living Arrangement: Home Anticipated Discharge Location: Home Barriers to Discharge: Ongoing medical management Dispo: Anticipated discharge in approximately 2-3 day(s).   , MD 01/19/2020, 1:54 PM Pager: [phone] After 5pm on weekdays and 1pm on weekends: On Call pager [phone]

## 2020-01-19 NOTE — Evaluation (Signed)
Occupational Therapy Evaluation Patient Details Name: Jose [name] MRN: [id number] DOB: 08-20-1955 Today's Date: 01/19/2020    History of Present Illness 64 yo male with onset of SOB was admitted with hemoptysis but cleared for PE.  Had hematuria at home, supratherapeutic INR on admission.  Pt reports night use of O2 but often hypoxic upon waking.  PMHx:  COPD, antiphospholipid antibody syndrome, CAD with angioplasty, COPD,  R BBB, DM, PE, OSA, HTN   Clinical Impression   PTA patient reports independent and working (from home).  Admitted for above and limited by problem list below, including impaired cognition, decreased activity tolerance, impaired balance, and generalized weakness. Patient reports wearing 2L O2 at night only PTA, requires 3L during session for 93-94% SpO2.  Patient oriented to self, unsure about place but reports [facility]; requires cueing for problem solving, poor awareness to deficits and safety and poor attention to/completion of tasks.  Patient currently requires min assist for bed mobility, min assist for transfers and mobility using RW, and min-mod assist for ADLs. He will benefit from continued OT services while admitted and after dc at SNF Level at this time to optimize independence and safety with ADLs, mobility. IF can confirm 24/7 support at dc, may progress to [facility] services. Will follow.     Follow Up Recommendations  SNF;Supervision/Assistance - 24 hour (if has 24/7 support HHOT )    Equipment Recommendations  3 in 1 bedside commode    Recommendations for Other Services       Precautions / Restrictions Precautions Precautions: Fall Precaution Comments: monitor O2 sats, HR Restrictions Weight Bearing Restrictions: No      Mobility Bed Mobility Overal bed mobility: Needs Assistance Bed Mobility: Supine to Sit     Supine to sit: Min assist     General bed mobility comments: min assist to ascend trunk, increased time and effort     Transfers Overall transfer level: Needs assistance Equipment used: Rolling walker (2 wheeled) Transfers: Sit to/from Stand Sit to Stand: Min assist         General transfer comment: min assist to power up and steady     Balance Overall balance assessment: Needs assistance Sitting-balance support: Feet supported Sitting balance-[name] Scale: Fair     Standing balance support: Bilateral upper extremity supported;During functional activity Standing balance-[name] Scale: Poor Standing balance comment: relies on BUE support                           ADL either performed or assessed with clinical judgement   ADL Overall ADL's : Needs assistance/impaired     Grooming: Minimal assistance;Standing   Upper Body Bathing: Sitting;Minimal assistance   Lower Body Bathing: Minimal assistance;Sit to/from stand   Upper Body Dressing : Sitting;Minimal assistance   Lower Body Dressing: Minimal assistance;Sit to/from stand Lower Body Dressing Details (indicate cue type and reason): assist with socks (baseline), min assis sit to stand  Toilet Transfer: Minimal assistance;Ambulation;RW     Toileting - Clothing Manipulation Details (indicate cue type and reason): incontinent in bed, total assist for hygiene      Functional mobility during ADLs: Minimal assistance;Cueing for safety;Cueing for sequencing;Rolling walker General ADL Comments: limited by cognition, activity tolerance, balance      Vision Baseline Vision/History: Wears glasses Wears Glasses: At all times Patient Visual Report: No change from baseline Vision Assessment?: No apparent visual deficits     Perception     Praxis  Pertinent Vitals/Pain Pain Assessment: No/denies pain     Hand Dominance Left   Extremity/Trunk Assessment Upper Extremity Assessment Upper Extremity Assessment: Generalized weakness   Lower Extremity Assessment Lower Extremity Assessment: Defer to PT evaluation   Cervical  / Trunk Assessment Cervical / Trunk Assessment: Normal   Communication Communication Communication: No difficulties   Cognition Arousal/Alertness: Awake/alert Behavior During Therapy: Impulsive Overall Cognitive Status: Impaired/Different from baseline Area of Impairment: Safety/judgement;Awareness;Problem solving;Following commands;Memory;Attention;Orientation                 Orientation Level: Situation Current Attention Level: Focused Memory: Decreased short-term memory;Decreased recall of precautions Following Commands: Follows one step commands inconsistently;Follows one step commands with increased time Safety/Judgement: Decreased awareness of deficits;Decreased awareness of safety Awareness: Intellectual Problem Solving: Slow processing;Requires verbal cues;Requires tactile cues;Decreased initiation;Difficulty sequencing General Comments: patient questions being at [facility], but reports such; unable to complete dual tasks, attend to or complete simple ADL tasks; follows 1 step commands inconsistently   General Comments  pt on 3L supplemental O2 during session with SpO2 93-94%    Exercises     Shoulder Instructions      Home Living Family/patient expects to be discharged to:: Private residence Living Arrangements: Spouse/significant other Available Help at Discharge: Family;Available 24 hours/day Type of Home: House Home Access: Stairs to enter [employer] of Steps: 4 Entrance Stairs-Rails: Right;Left Home Layout: One level     Bathroom Shower/Tub: Chief Strategy Officer: Standard     Home Equipment: Emergency planning/management officer - 2 wheels;Hand held shower head;Grab bars - tub/shower;Cane - single point          Prior Functioning/Environment Level of Independence: Independent        Comments: driving and working, runs a [employer] (reports spouse assist with LB ADLs )        OT Problem List: Decreased  strength;Decreased activity tolerance;Impaired balance (sitting and/or standing);Decreased safety awareness;Decreased cognition;Decreased knowledge of use of DME or AE;Decreased knowledge of precautions;Cardiopulmonary status limiting activity;Obesity      OT Treatment/Interventions: Self-care/ADL training;Therapeutic exercise;DME and/or AE instruction;Therapeutic activities;Cognitive remediation/compensation;Patient/family education;Balance training;Energy conservation    OT Goals(Current goals can be found in the care plan section) Acute Rehab OT Goals Patient Stated Goal: to feel better OT Goal Formulation: With patient Time For Goal Achievement: 02/02/20 Potential to Achieve Goals: Fair  OT Frequency: Min 2X/week   Barriers to D/C:            Co-evaluation PT/OT/SLP Co-Evaluation/Treatment: Yes Reason for Co-Treatment: Necessary to address cognition/behavior during functional activity;To address functional/ADL transfers   OT goals addressed during session: ADL's and self-care      AM-PAC OT "6 Clicks" Daily Activity     Outcome Measure Help from another person eating meals?: A Little Help from another person taking care of personal grooming?: A Little Help from another person toileting, which includes using toliet, bedpan, or urinal?: A Lot Help from another person bathing (including washing, rinsing, drying)?: A Lot Help from another person to put on and taking off regular upper body clothing?: A Little Help from another person to put on and taking off regular lower body clothing?: A Lot 6 Click Score: 15   End of Session Equipment Utilized During Treatment: Gait belt;Rolling walker;Oxygen Nurse Communication: Mobility status;Precautions  Activity Tolerance: Patient tolerated treatment well Patient left: in chair;with call bell/phone within reach;with chair alarm set  OT Visit Diagnosis: Other abnormalities of gait and mobility (R26.89);Muscle weakness (generalized)  (M62.81);Other symptoms and signs  involving cognitive function                Time: 4037-5436 OT Time Calculation (min): 32 min Charges:  OT General Charges $OT Visit: 1 Visit OT Evaluation $OT Eval Moderate Complexity: 1 Mod  Barry Brunner, OT Acute Rehabilitation Services Pager [phone] Office [phone]   Chancy Milroy 01/19/2020, 1:33 PM

## 2020-01-19 NOTE — Progress Notes (Signed)
Notified MD of confusion and agitation, MD to see patient.

## 2020-01-19 NOTE — Plan of Care (Signed)
Pt has good knowledge about his medication.

## 2020-01-19 NOTE — Progress Notes (Signed)
Patient refused the use of CPAP for the evening.  

## 2020-01-19 NOTE — Progress Notes (Addendum)
ANTICOAGULATION CONSULT NOTE  Pharmacy Consult for Warfarin  Indication: History of pulmonary embolus  Allergies  Allergen Reactions   Melatonin Anxiety   Ramipril     cough   Vital Signs: Temp: 98.3 F (36.8 C) (11/05 0505) Temp Source: Oral (11/05 0505) BP: 138/98 (11/05 0505) Pulse Rate: 83 (11/05 0505)  Labs: Recent Labs    01/18/20 1435 01/18/20 1512 01/19/20 0351  HGB 12.9*  --  12.6*  HCT 42.0  --  40.3  PLT 275  --  321  LABPROT  --  33.0* 30.4*  INR  --  3.4* 3.0*  CREATININE 0.71  --  0.70    Estimated Creatinine Clearance: 116 mL/min (by C-G formula based on SCr of 0.7 mg/dL).   Medical History: Past Medical History:  Diagnosis Date   Antiphospholipid antibody syndrome [facility])    CAD S/P percutaneous coronary angioplasty '98, '04, Feb 2017   [facility]   Carotid arterial disease (HCC) 10/2015   moderate bilateral   COPD (chronic obstructive pulmonary disease) (HCC)    Coronary artery disease    Hypertension    Non-insulin treated type 2 diabetes mellitus (HCC)    Pulmonary emboli (HCC) 2011   RBBB    Sleep apnea    Assessment: 64 y/o M presents with increasing shortness of breath, being admitted for CAP vs COPD exacerbation. On warfarin PTA for history of PE with INR supra-therapeutic on admission at 3.4 (warfarin held 11/4). Of note, MD note mentions pt reported a "fair amount" of hemoptysis and 1 month of hematuria at home.   INR down to 3.0 after held dose last night. CBC stable. Noted DDI with azithromycin (day #2) - likely will cause INR to increase.  PTA warfarin dose: 6mg  daily except 5mg  on Mon/Fri per last outpatient anticoagulation clinic note; although appears to be taking incorrectly at home per discussion with patient (6mg  daily except 7mg  on Mon/Fri) and has history of this issue per notes. When questioned on how he takes this medication, he states "randomly."  Goal of Therapy:  INR 2-3 Monitor platelets by anticoagulation  protocol: Yes   Plan:  Warfarin 2.5mg  PO x 1 dose at 1600 - lower dose with high-normal INR and DDI with azithromycin Monitor daily INR, CBC, watch s/sx bleeding closely   , PharmD, BCPS Please check AMION for all [facility] Pharm[facility] contact numbers Clinical Pharmacist 01/19/2020 7:57 AM

## 2020-01-19 NOTE — Evaluation (Signed)
Physical Therapy Evaluation Patient Details Name: Jose [name] MRN: [id number] DOB: [birth year] Today's Date: 01/19/2020   History of Present Illness  64 yo male with onset of SOB was admitted with hemoptysis but cleared for PE.  Had hematuria at home, supratherapeutic INR on admission.  Pt reports night use of O2 but often hypoxic upon waking.  PMHx:  COPD, antiphospholipid antibody syndrome, CAD with angioplasty, COPD,  R BBB, DM, PE, OSA, HTN  Clinical Impression  Pt was seen for mobility on RW with assistance, and noted his struggle to plan the sequence with walker as well as set limits of distance.  Pt is questioning the information that he is in the hospital, and cannot give a full history.  Follow acutely for mobility and strengthening to help decrease time needed for getting home, but also could potentially go directly home if 24/7 help is there.  Otherwise presents a significant safety risk for injury and poor outcome.        Follow Up Recommendations SNF    Equipment Recommendations  None recommended by PT    Recommendations for Other Services       Precautions / Restrictions Precautions Precautions: Fall Precaution Comments: monitor O2 sats, HR Restrictions Weight Bearing Restrictions: No      Mobility  Bed Mobility               General bed mobility comments: on side of bed when PT arrived    Transfers Overall transfer level: Needs assistance Equipment used: Rolling walker (2 wheeled) Transfers: Sit to/from Stand Sit to Stand: Min assist         General transfer comment: min assist from lower height, better control wiht cued hand placement  Ambulation/Gait Ambulation/Gait assistance: Min guard Gait Distance (Feet): 60 Feet Assistive device: Rolling walker (2 wheeled);1 person hand held assist Gait Pattern/deviations: Step-to pattern;Step-through pattern;Decreased stride length;Trunk flexed;Wide base of support Gait velocity: reduced Gait velocity  interpretation: <1.31 ft/sec, indicative of household ambulator General Gait Details: pt was on hallway with recliner behind him, required cued sitting instructions for completion of walk as he cannot determine his own limits other than voicing fatigue.  Declined to sit when asked initially  Stairs            Wheelchair Mobility    Modified Rankin (Stroke Patients Only)       Balance Overall balance assessment: Needs assistance Sitting-balance support: Feet supported Sitting balance-[name] Scale: Fair     Standing balance support: Bilateral upper extremity supported;During functional activity Standing balance-[name] Scale: Poor                               Pertinent Vitals/Pain Pain Assessment: No/denies pain    Home Living Family/patient expects to be discharged to:: Private residence Living Arrangements: Spouse/significant other Available Help at Discharge: Family;Available 24 hours/day Type of Home: House Home Access: Stairs to enter Entrance Stairs-Rails: Doctor, general practice of Steps: 4 Home Layout: One level Home Equipment: Emergency planning/management officer - 2 wheels;Hand held shower head;Grab bars - tub/shower;Cane - single point      Prior Function Level of Independence: Independent         Comments: driving and working, runs a Armed forces logistics/support/administrative officer   Dominant Hand: Left    Extremity/Trunk Assessment   Upper Extremity Assessment Upper Extremity Assessment: Defer to OT evaluation    Lower Extremity Assessment Lower Extremity Assessment: Generalized weakness  Cervical / Trunk Assessment Cervical / Trunk Assessment: Normal  Communication   Communication: No difficulties  Cognition Arousal/Alertness: Awake/alert Behavior During Therapy: Impulsive Overall Cognitive Status: Impaired/Different from baseline Area of Impairment: Safety/judgement;Awareness;Problem solving;Following commands;Memory;Attention;Orientation                  Orientation Level: Situation;Time;Place Current Attention Level: Selective Memory: Decreased short-term memory;Decreased recall of precautions Following Commands: Follows one step commands inconsistently;Follows one step commands with increased time Safety/Judgement: Decreased awareness of deficits;Decreased awareness of safety Awareness: Intellectual Problem Solving: Slow processing;Requires verbal cues;Requires tactile cues General Comments: pt questions information that he is at hosp, confused and unable to set limits for himself for safety      General Comments General comments (skin integrity, edema, etc.): pt is up to walk after testing O2 sats at 93% baseline, 94% post gait.  Has no ability to initiate a change in a task without cues     Exercises     Assessment/Plan    PT Assessment Patient needs continued PT services  PT Problem List Decreased strength;Decreased range of motion;Decreased activity tolerance;Decreased balance;Decreased coordination;Decreased mobility;Decreased cognition;Decreased knowledge of use of DME;Decreased safety awareness;Cardiopulmonary status limiting activity       PT Treatment Interventions DME instruction;Gait training;Stair training;Functional mobility training;Therapeutic activities;Therapeutic exercise;Balance training;Neuromuscular re-education;Patient/family education    PT Goals (Current goals can be found in the Care Plan section)  Acute Rehab PT Goals Patient Stated Goal: To go for a walk PT Goal Formulation: With patient Time For Goal Achievement: 02/02/20 Potential to Achieve Goals: Good    Frequency Min 3X/week   Barriers to discharge Decreased caregiver support;Inaccessible home environment no clear information that he has 24/7 help and pt is in a staired entrance environment    Co-evaluation               AM-PAC PT "6 Clicks" Mobility  Outcome Measure Help needed turning from your back to your side while  in a flat bed without using bedrails?: A Little Help needed moving from lying on your back to sitting on the side of a flat bed without using bedrails?: A Little Help needed moving to and from a bed to a chair (including a wheelchair)?: A Little Help needed standing up from a chair using your arms (e.g., wheelchair or bedside chair)?: A Little Help needed to walk in hospital room?: A Little Help needed climbing 3-5 steps with a railing? : Total 6 Click Score: 16    End of Session Equipment Utilized During Treatment: Oxygen Activity Tolerance: Patient limited by fatigue;Treatment limited secondary to medical complications (Comment) Patient left: in chair;with call bell/phone within reach;with chair alarm set;with nursing/sitter in room Nurse Communication: Mobility status PT Visit Diagnosis: Unsteadiness on feet (R26.81);Muscle weakness (generalized) (M62.81);Difficulty in walking, not elsewhere classified (R26.2)    Time: 7619-5093 PT Time Calculation (min) (ACUTE ONLY): 26 min   Charges:   PT Evaluation $PT Eval Moderate Complexity: 1 Mod PT Treatments $Gait Training: 8-22 mins       Ivar Drape 01/19/2020, 1:01 PM   Samul Dada, PT MS Acute Rehab Dept. Number: [facility] [phone] and [facility] [phone]

## 2020-01-20 ENCOUNTER — Inpatient Hospital Stay (HOSPITAL_COMMUNITY): Payer: BC Managed Care – PPO

## 2020-01-20 LAB — URINALYSIS, ROUTINE W REFLEX MICROSCOPIC
Bacteria, UA: NONE SEEN
Bilirubin Urine: NEGATIVE
Glucose, UA: NEGATIVE mg/dL
Ketones, ur: NEGATIVE mg/dL
Leukocytes,Ua: NEGATIVE
Nitrite: NEGATIVE
Protein, ur: 30 mg/dL — AB
RBC / HPF: >50 RBC/hpf — ABNORMAL HIGH (ref 0–5)
Specific Gravity, Urine: 1.014 (ref 1.005–1.030)
pH: 5 (ref 5.0–8.0)

## 2020-01-20 LAB — BLOOD GAS, ARTERIAL
Acid-Base Excess: 8.6 mmol/L — ABNORMAL HIGH (ref 0.0–2.0)
Bicarbonate: 35 mmol/L — ABNORMAL HIGH (ref 20.0–28.0)
Drawn by: 24686
FIO2: 36
O2 Saturation: 94.9 %
Patient temperature: 37
pCO2 arterial: 72.9 mmHg (ref 32.0–48.0)
pH, Arterial: 7.302 — ABNORMAL LOW (ref 7.350–7.450)
pO2, Arterial: 77.9 mmHg — ABNORMAL LOW (ref 83.0–108.0)

## 2020-01-20 LAB — BASIC METABOLIC PANEL
Anion gap: 9 (ref 5–15)
BUN: 14 mg/dL (ref 8–23)
CO2: 31 mmol/L (ref 22–32)
Calcium: 9.2 mg/dL (ref 8.9–10.3)
Chloride: 98 mmol/L (ref 98–111)
Creatinine, Ser: 0.69 mg/dL (ref 0.61–1.24)
GFR, Estimated: >60 mL/min (ref >60)
Glucose, Bld: 176 mg/dL — ABNORMAL HIGH (ref 70–99)
Potassium: 3.9 mmol/L (ref 3.5–5.1)
Sodium: 138 mmol/L (ref 135–145)

## 2020-01-20 LAB — CBC
HCT: 46 % (ref 39.0–52.0)
Hemoglobin: 14.2 g/dL (ref 13.0–17.0)
MCH: 29.8 pg (ref 26.0–34.0)
MCHC: 30.9 g/dL (ref 30.0–36.0)
MCV: 96.6 fL (ref 80.0–100.0)
Platelets: 379 10*3/uL (ref 150–400)
RBC: 4.76 MIL/uL (ref 4.22–5.81)
RDW: 15 % (ref 11.5–15.5)
WBC: 12.3 10*3/uL — ABNORMAL HIGH (ref 4.0–10.5)
nRBC: 0 % (ref 0.0–0.2)

## 2020-01-20 LAB — PROTIME-INR
INR: 2.1 — ABNORMAL HIGH (ref 0.8–1.2)
Prothrombin Time: 22.4 seconds — ABNORMAL HIGH (ref 11.4–15.2)

## 2020-01-20 LAB — GLUCOSE, CAPILLARY
Glucose-Capillary: 167 mg/dL — ABNORMAL HIGH (ref 70–99)
Glucose-Capillary: 159 mg/dL — ABNORMAL HIGH (ref 70–99)
Glucose-Capillary: 101 mg/dL — ABNORMAL HIGH (ref 70–99)
Glucose-Capillary: 187 mg/dL — ABNORMAL HIGH (ref 70–99)
Glucose-Capillary: 124 mg/dL — ABNORMAL HIGH (ref 70–99)

## 2020-01-20 LAB — HEPARIN LEVEL (UNFRACTIONATED): Heparin Unfractionated: <0.1 IU/mL — ABNORMAL LOW (ref 0.30–0.70)

## 2020-01-20 MED ORDER — HEPARIN (PORCINE) 25000 UT/250ML-% IV SOLN
1850.0000 [IU]/h | INTRAVENOUS | Status: AC
  Administered 2020-01-20: 1200 [IU]/h via INTRAVENOUS
  Administered 2020-01-21 – 2020-01-22 (×2): 1850 [IU]/h via INTRAVENOUS
  Filled 2020-01-20 (×2): qty 250
  Filled 2020-01-20: qty 3750

## 2020-01-20 MED ORDER — IPRATROPIUM-ALBUTEROL 0.5-2.5 (3) MG/3ML IN SOLN
3.0000 mL | Freq: Three times a day (TID) | RESPIRATORY_TRACT | Status: DC
  Administered 2020-01-20 – 2020-01-22 (×8): 3 mL via RESPIRATORY_TRACT
  Filled 2020-01-20 (×9): qty 3

## 2020-01-20 MED ORDER — IOHEXOL 300 MG/ML  SOLN
120.0000 mL | Freq: Once | INTRAMUSCULAR | Status: AC | PRN
  Administered 2020-01-20: 120 mL via INTRAVENOUS

## 2020-01-20 NOTE — TOC Initial Note (Signed)
Transition of Care [facility]) - Initial/Assessment Note    Patient Details  Name: Jose [name] MRN: [id number] Date of Birth: 08/14/1955  Transition of Care [facility]) CM/SW Contact:    Carmina Miller, LCSWA Phone Number: 01/20/2020, 1:39 PM  Clinical Narrative:                 CSW received consult for possible SNF placement at time of discharge. CSW spoke with patient's wife Gaye by phone [phone], regarding PT recommendation of SNF placement at time of discharge due to patient's ongoing confusion. Galen Daft reported she was out of town on business when pt called her and said he was going to the hospital. Galen Daft expressed concerns as pt has been confused and doesn't know where he is. Galen Daft stated she would like to have her husband back to baseline so he can participate in the conversation about SNF vs. Home. Galen Daft stated it was ok for CSW to send offers out to local SNFs as long as it didn't commit pt to anything. CSW advised Galen Daft that another CSW would reach out next week to confirm patient's plans for dc. Patient has not received the COVID vaccines. CSW to continue to follow and assist with discharge planning needs.       Patient Goals and CMS Choice        Expected Discharge Plan and Services                                                Prior Living Arrangements/Services                       Activities of Daily Living Home Assistive Devices/Equipment: CPAP, Walker (specify type), Oxygen, Nebulizer, Eyeglasses ADL Screening (condition at time of admission) Patient's cognitive ability adequate to safely complete daily activities?: Yes Is the patient deaf or have difficulty hearing?: No Does the patient have difficulty seeing, even when wearing glasses/contacts?: No Does the patient have difficulty concentrating, remembering, or making decisions?: No Patient able to express need for assistance with ADLs?: Yes Does the patient have difficulty dressing or bathing?:  No Independently performs ADLs?: No Communication: Independent Dressing (OT): Independent Grooming: Independent Feeding: Independent Bathing: Needs assistance Is this a change from baseline?: Change from baseline, expected to last <3 days Toileting: Independent In/Out Bed: Needs assistance Is this a change from baseline?: Change from baseline, expected to last <3 days Walks in Home: Needs assistance Is this a change from baseline?: Pre-admission baseline Does the patient have difficulty walking or climbing stairs?: Yes Weakness of Legs: Both Weakness of Arms/Hands: None  Permission Sought/Granted                  Emotional Assessment              Admission diagnosis:  Community acquired pneumonia [J18.9] Community acquired pneumonia of right lung, unspecified part of lung [J18.9] Patient Active Problem List   Diagnosis Date Noted  . Community acquired pneumonia 01/18/2020  . Coronary atherosclerosis 10/04/2017  . GERD (gastroesophageal reflux disease) 10/04/2017  . Hypertension 10/04/2017  . Transient visual loss 10/04/2017  . 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 Nonrheumatic aortic valve stenosis 03/31/2017  . Pericarditis, Acute 02/12/2017  . Acute on chronic right heart failure (HCC) 02/11/2017  . Angina at rest [facility]) 08/17/2016  . Unstable angina (HCC)  08/16/2016  . Tobacco abuse 08/16/2016  . Anti-phospholipid syndrome (HCC) 08/16/2016  . Hyperlipidemia 08/16/2016  . Essential hypertension, benign 08/16/2016  . COPD (chronic obstructive pulmonary disease) (HCC) 08/16/2016  . Sleep apnea 08/16/2016  . CAD S/P multiple PCIs 08/16/2016  . History of pulmonary embolus (PE)-2011 08/16/2016  . Chronic anticoagulation-Couamdin 08/16/2016  . Non-insulin treated type 2 diabetes mellitus (HCC) 08/16/2016  . Carotid artery disease (HCC) 08/16/2016  . RBBB (right bundle branch block) 08/16/2016  . Dyspnea on exertion 10/17/2015  . ED (erectile dysfunction) 08/09/2012    PCP:  [name], MD Pharmacy:   [facility] [phone] - [address], [place] - [address] [place] [address] Phone: [phone] Fax: [phone]     Social Determinants of Health (SDOH) Interventions    Readmission Risk Interventions No flowsheet data found.

## 2020-01-20 NOTE — Plan of Care (Signed)

## 2020-01-20 NOTE — Progress Notes (Addendum)
ANTICOAGULATION CONSULT NOTE - Initial Consult  Pharmacy Consult for heparin gtt Indication: history of pulmonary embolus  Allergies  Allergen Reactions  . Melatonin Anxiety  . Ramipril     cough    Patient Measurements: Height: 5' 7.01" (170.2 cm) Weight: 108.4 kg (238 lb 15.7 oz) IBW/kg (Calculated) : 66.12 Heparin Dosing Weight: 90.4kg  Vital Signs: Temp: 98.6 F (37 C) (11/06 0850) Temp Source: Oral (11/06 0445) BP: 157/85 (11/06 0850) Pulse Rate: 85 (11/06 0445)  Labs: Recent Labs    01/18/20 1435 01/18/20 1435 01/18/20 1512 01/19/20 0351 01/20/20 0429  HGB 12.9*   < >  --  12.6* 14.2  HCT 42.0  --   --  40.3 46.0  PLT 275  --   --  321 379  LABPROT  --   --  33.0* 30.4* 22.4*  INR  --   --  3.4* 3.0* 2.1*  CREATININE 0.71  --   --  0.70 0.69   < > = values in this interval not displayed.    Estimated Creatinine Clearance: 109.5 mL/min (by C-G formula based on SCr of 0.69 mg/dL).   Medical History: Past Medical History:  Diagnosis Date  . Antiphospholipid antibody syndrome (HCC)   . CAD S/P percutaneous coronary angioplasty '98, '04, Feb 2017   [facility]  . Carotid arterial disease (HCC) 10/2015   moderate bilateral  . COPD (chronic obstructive pulmonary disease) (HCC)   . Coronary artery disease   . Hypertension   . Non-insulin treated type 2 diabetes mellitus (HCC)   . Pulmonary emboli (HCC) 2011  . RBBB   . Sleep apnea     Assessment: 64 yo M who takes warfarin PTA for history of PE with antiphospholipid syndrome with INR goal 2.0-3.0. Upon admission INR was supratherapeutic at 3.4. This morning 11/6, INR 2.1.  PTA warfarin dose: 6mg  daily except 5mg  on Mon/Fri per last outpatient anticoagulation clinic note; although appears to be taking incorrectly at home per discussion with patient (6mg  daily except 7mg  on Mon/Fri) and has history of this issue per notes. When questioned on how he takes this medication, he states "randomly  Pt did receive  warfarin 2.5mg  x1 on 11/5. Now pharmacy consulted to dose heparin gtt for therapeutic anticoagulation with future plans to discharge patient on oral anticoagulation therapy.   Per RN there is no visualized bleeding from patient in urine or from lines.    Goal of Therapy:  Heparin level 0.3-0.7 units/ml Monitor platelets by anticoagulation protocol: Yes   Plan:  No further warfarin doses at this time No heparin bolus Initiate heparin at 1200 units/hr 6hr HL at 1900  Monitor s/sx bleeding, CBC, clinical progress, transition to oral anticoagulation   , PharmD PGY1 Pharmacy Resident 01/20/2020 12:53 PM

## 2020-01-20 NOTE — Progress Notes (Addendum)
Subjective: HD 2 Overnight, patient refused CPAP. No other acute events reported.    [name] was evaluated at bedside this morning. He is somnolent on examination. He is answering questions inappropriately. He notes sleeping well overnight. He endorses persistent cough but without sputum production. Unable to obtain further history due to mental status.   Objective:  Vital signs in last 24 hours: Vitals:   01/19/20 1933 01/19/20 2215 01/20/20 0445 01/20/20 0455  BP:  109/67 (!) 124/102 (!) 163/81  Pulse: 87 80 85   Resp: 16 18 18    Temp:  98.1 F (36.7 C) 98.1 F (36.7 C)   TempSrc:  Oral Oral   SpO2: 90% 97% 97%   Height:       Physical Exam  Constitutional: Appears well-developed and well-nourished. Somnolent on exam but no distress.  HENT: Normocephalic and atraumatic, EOMI, conjunctiva normal, moist mucous membranes; on 4L [place].  Cardiovascular: Normal rate, regular rhythm, S1 and S2 present, no murmurs, rubs, gallops.  Distal pulses intact; JVD present  Respiratory: No respiratory distress, no accessory muscle use, diffuse expiratory wheezing and bilateral rales; on 4L [place].  Musculoskeletal: Normal bulk and tone.  Persistent 2+ pitting edema.  Neurological: somnolent on exam;  no apparent focal deficits noted. Skin: Warm and dry.  No rash, erythema, lesions noted.  Assessment/Plan:  Principal Problem:   Community acquired pneumonia Active Problems:   Anti-phospholipid syndrome (HCC)   COPD (chronic obstructive pulmonary disease) (HCC)   Non-insulin treated type 2 diabetes mellitus (HCC)   Acute on chronic right heart failure [facility])  [name] is a 64 year old male with PMHx of COPD Stage IV on 2L O2 at baseline, non-insulin dependent diabetes mellitus, HFpEF, OSA/ OHS and antiphospholipid syndrome with prior history of PE on chronic coumadin therapy admitted for acute on chronic hypoxic respiratory failure 2/2 CAP.   Acute metabolic encephalopathy:   Patient noted to have increased somnolence with inappropriate response to questions on examination. He has a history of COPD and obstructive sleep apnea. Overnight patient refused his CPAP and he is requiring increased oxygen this morning to 4L [place] to maintain SpO2 >88%. ABG obtained which was significant for pH 7.30 with pCO2 of 72.9. Comparative VBG from 12/2018 with pH of 7.31 with pCO2 of 62. Suspect patient's metabolic encephalopathy may be in setting of worsening CO2 retention. - BiPAP prn  - CPAP at night  - Will continue to hold prednisone in setting of encephalopathy - Delirium precautions continued   Acute on chronic hypoxic and hypercapneic respiratory failure in setting of community acquired pneumonia:  Presenting with dyspnea, productive cough with hemoptysis. CXR with medial right lung base opacity. Remains afebrile but mild increase in leukocytosis; however, PCT was low. He is having increasing oxygen requirement to 4L at this time. On examination, has diffuse wheezing and rales.  - Continue azithromycin and ceftriaxone day 3 - Duonebs q6h  - Continue albuterol inhaler q4h prn  Acute on chronic HFpEF exacerbation: Patient with history of HFpEF on torsemide 20mg  qd at home. He has received IV Lasix daily with ~625cc urine output. Continues to appear hypervolemic on examination with 2+ pitting edema, bilateral crackles and JVD. Renal function is stable. - Increase IV Lasix to 40mg  bid  - Strict I&O  - Monitor renal function - Continue cardiac monitoring - Continue metoprolol 12.5mg  bid  Antiphospholipid antibody syndrome with supratherapeutic INR: Patient presented with supratherapeutic INR with hemoptysis and hematuria. No evidence of active bleeding for  which vitamin K was held. His INR is trending down. INR of 2.1 today. Although patient does have hemoglobinuria on urinalysis, his serum Hb has remained stable.  - Will start with heparin bridge at this time  - Monitor for any  signs of bleeding  - Trending CBC   Hematuria:  On admission, patient endorsed one month of hematuria but unable to quantify how much. Denies any associated urinary symptoms. Abdominal exam benign. However, UA was significant for >50 RBCs.  Suspect his supratherapeutic INR may have been contributing to his hematuria. He does have a 20 pack year smoking history, making him at increased risk of renal and bladder cancer. Renal function is wnl. Discussed with on-call urologist who recommended for CT urogram and f/u with [facility] as outpatient for cystoscopy  - CT Urogram   Diet: CM Fluids/Electrolytes: None  DVT Prophylaxis: Therapeutic dose heparin gtt  Code status: FULL   Prior to Admission Living Arrangement: Home Anticipated Discharge Location: SNF Barriers to Discharge: Continued medical management  Dispo: Anticipated discharge pending clinical improvement   Eliezer Bottom, MD  IMTS PGY-2 01/20/2020, 6:14 AM Pager: [phone] After 5pm on weekdays and 1pm on weekends: On Call pager [phone]

## 2020-01-20 NOTE — Progress Notes (Signed)
ANTICOAGULATION CONSULT NOTE   Pharmacy Consult for heparin gtt Indication: history of pulmonary embolus  Allergies  Allergen Reactions  . Melatonin Anxiety  . Ramipril     cough    Patient Measurements: Height: 5' 7.01" (170.2 cm) Weight: 108.4 kg (238 lb 15.7 oz) IBW/kg (Calculated) : 66.12 Heparin Dosing Weight: 90.4kg  Vital Signs: Temp: 98.7 F (37.1 C) (11/06 2039) Temp Source: Oral (11/06 2039) BP: 110/64 (11/06 2039) Pulse Rate: 72 (11/06 1459)  Labs: Recent Labs    01/18/20 1435 01/18/20 1435 01/18/20 1512 01/19/20 0351 01/20/20 0429 01/20/20 2126  HGB 12.9*   < >  --  12.6* 14.2  --   HCT 42.0  --   --  40.3 46.0  --   PLT 275  --   --  321 379  --   LABPROT  --   --  33.0* 30.4* 22.4*  --   INR  --   --  3.4* 3.0* 2.1*  --   HEPARINUNFRC  --   --   --   --   --  <0.10*  CREATININE 0.71  --   --  0.70 0.69  --    < > = values in this interval not displayed.    Estimated Creatinine Clearance: 109.5 mL/min (by C-G formula based on SCr of 0.69 mg/dL).   Medical History: Past Medical History:  Diagnosis Date  . Antiphospholipid antibody syndrome (HCC)   . CAD S/P percutaneous coronary angioplasty '98, '04, Feb 2017   [facility]  . Carotid arterial disease (HCC) 10/2015   moderate bilateral  . COPD (chronic obstructive pulmonary disease) (HCC)   . Coronary artery disease   . Hypertension   . Non-insulin treated type 2 diabetes mellitus (HCC)   . Pulmonary emboli (HCC) 2011  . RBBB   . Sleep apnea     Assessment: 64 yo M who takes warfarin PTA for history of PE with antiphospholipid syndrome with INR goal 2.0-3.0. Upon admission INR was supratherapeutic at 3.4. This morning 11/6, INR 2.1.  PTA warfarin dose: 6mg  daily except 5mg  on Mon/Fri per last outpatient anticoagulation clinic note; although appears to be taking incorrectly at home per discussion with patient (6mg  daily except 7mg  on Mon/Fri) and has history of this issue per notes. When  questioned on how he takes this medication, he states "randomly  Pt did receive warfarin 2.5mg  x1 on 11/5. Now pharmacy consulted to dose heparin gtt for therapeutic anticoagulation with future plans to discharge patient on oral anticoagulation therapy.   Per RN there is no visualized bleeding from patient in urine or from lines.   PM f/u> heparin level undetectable.  No overt bleeding or complications noted.   Goal of Therapy:  Heparin level 0.3-0.7 units/ml Monitor platelets by anticoagulation protocol: Yes   Plan:  Increase IV heparin to 1450 units/hr. Repeat heparin level in 6 hrs. Daily heparin level and CBC. Monitor s/sx bleeding, CBC, clinical progress, transition to oral anticoagulation   , , [facility] Clinical Pharmacist  01/20/2020 10:37 PM   [facility] pharmacy phone numbers are listed on [email or web address]

## 2020-01-20 NOTE — Progress Notes (Signed)
CRITICAL VALUE ALERT  Critical Value:  Paco2 72.9  Date & Time Notied: 01/20/20 1116  Provider Notified: S. Aslam 1116  Orders Received/Actions taken: MD called and stated to start BiPAP. Respitatory called to start bipap.

## 2020-01-20 NOTE — Progress Notes (Signed)
CRITICAL VALUE ALERT  Critical Value:  Pco2 72.9  Date & Time Notified:  01/20/20 1109  Provider Notified at 1112 P.Braswell MD   Orders Received/Actions taken: he will look at lab value at 1114

## 2020-01-20 NOTE — Progress Notes (Signed)
Placed patient on BiPAP per MD's order. 

## 2020-01-20 NOTE — Progress Notes (Signed)
Tele called and said that pt. Has a run of 6 beats V-Tach, and PVC's have increased since this am, tele stated pt. Is having runs of bigenimy PVC's. MD notified, said he is coming to see pt.

## 2020-01-21 LAB — BASIC METABOLIC PANEL
Anion gap: 12 (ref 5–15)
BUN: 16 mg/dL (ref 8–23)
CO2: 31 mmol/L (ref 22–32)
Calcium: 8.9 mg/dL (ref 8.9–10.3)
Chloride: 98 mmol/L (ref 98–111)
Creatinine, Ser: 0.67 mg/dL (ref 0.61–1.24)
GFR, Estimated: >60 mL/min (ref >60)
Glucose, Bld: 145 mg/dL — ABNORMAL HIGH (ref 70–99)
Potassium: 3.8 mmol/L (ref 3.5–5.1)
Sodium: 141 mmol/L (ref 135–145)

## 2020-01-21 LAB — CBC
HCT: 43 % (ref 39.0–52.0)
Hemoglobin: 13.3 g/dL (ref 13.0–17.0)
MCH: 29.4 pg (ref 26.0–34.0)
MCHC: 30.9 g/dL (ref 30.0–36.0)
MCV: 95.1 fL (ref 80.0–100.0)
Platelets: 300 10*3/uL (ref 150–400)
RBC: 4.52 MIL/uL (ref 4.22–5.81)
RDW: 14.8 % (ref 11.5–15.5)
WBC: 10.6 10*3/uL — ABNORMAL HIGH (ref 4.0–10.5)
nRBC: 0 % (ref 0.0–0.2)

## 2020-01-21 LAB — GLUCOSE, CAPILLARY
Glucose-Capillary: 129 mg/dL — ABNORMAL HIGH (ref 70–99)
Glucose-Capillary: 141 mg/dL — ABNORMAL HIGH (ref 70–99)
Glucose-Capillary: 116 mg/dL — ABNORMAL HIGH (ref 70–99)
Glucose-Capillary: 297 mg/dL — ABNORMAL HIGH (ref 70–99)

## 2020-01-21 LAB — PROTIME-INR
INR: 1.4 — ABNORMAL HIGH (ref 0.8–1.2)
Prothrombin Time: 16.6 seconds — ABNORMAL HIGH (ref 11.4–15.2)

## 2020-01-21 LAB — HEPARIN LEVEL (UNFRACTIONATED)
Heparin Unfractionated: <0.1 IU/mL — ABNORMAL LOW (ref 0.30–0.70)
Heparin Unfractionated: 0.31 IU/mL (ref 0.30–0.70)

## 2020-01-21 MED ORDER — WARFARIN SODIUM 3 MG PO TABS
6.0000 mg | ORAL_TABLET | Freq: Once | ORAL | Status: AC
  Administered 2020-01-21: 6 mg via ORAL
  Filled 2020-01-21: qty 2

## 2020-01-21 MED ORDER — WARFARIN - PHARMACIST DOSING INPATIENT: Freq: Every day | Status: DC

## 2020-01-21 NOTE — Progress Notes (Signed)
Subjective: HD 3 Overnight, patient remained on BiPAP.   [name] was evaluated at bedside this morning. He is sitting up in the bedside chair. He is much more alert this morning and interactive. He reports that he initially presented because he thought he had COVID. He is aware that he is being treated for community acquired pneumonia.   Objective:  Vital signs in last 24 hours: Vitals:   01/20/20 2330 01/20/20 2337 01/21/20 0408 01/21/20 0411  BP:   (!) 158/79   Pulse: 85 85 85   Resp: (!) 24 20 18    Temp:   98.4 F (36.9 C)   TempSrc:   Axillary   SpO2: 97% 97% 97%   Weight:    112.2 kg  Height:       Physical Exam  Constitutional: Appears well-developed and well-nourished. Somnolent on exam but no distress.  HENT: Normocephalic and atraumatic, EOMI, conjunctiva normal, moist mucous membranes; on 4L [place].  Cardiovascular: Normal rate, regular rhythm, S1 and S2 present, no murmurs, rubs, gallops.  Distal pulses intact Respiratory: No respiratory distress, no accessory muscle use, diffuse expiratory wheezing and bilateral rales; on 4L [place].  Musculoskeletal: Normal bulk and tone.  Minimal bilateral pitting edema to mid shin Neurological: awake, alert and orientedx4;  no apparent focal deficits noted. Skin: Warm and dry.  No rash, erythema, lesions noted.  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   Acute on chronic hypoxic and hypercapneic respiratory  failure in setting of community acquired pneumonia:  Presenting with dyspnea, productive cough with hemoptysis. CXR with medial right lung base opacity. CT Abdomen was significant for patchy consolidation at the right lung base suggestive of multilobar bronchopneumonia.  Remains afebrile and leukocytosis improving. However, continues to require 4L supplemental oxygen at this time. On examination, has diffuse wheezing and rales.  - Continue azithromycin and ceftriaxone day 3 - Duonebs q6h  - Continue albuterol inhaler q4h prn  Acute metabolic encephalopathy 2/2 hypercarbia from persistent COPD:  He has a history of COPD and obstructive sleep apnea. He was noted to have worsening respiratory acidosis on 11/6 for which he was initiated on BiPAP. Subsequently, had improvement in mental status this morning. He notes that he has been having difficulty obtaining his CPAP from pulmonology recently.  - BiPAP/CPAP at night - Delirium precautions continued   Acute on chronic HFpEF exacerbation: Patient with history of HFpEF on torsemide 20mg  qd at home. He has been diuresed with IV Lasix 40mg  twice daily; although he is only noted to have ~250cc UOP over past 24 hours. Noted to have increase weight of ~4kg from yesterday. On examination, has improved bilateral lower extremity edema but still has some crackles on lung examination. Will continue with diuresis at this time.  - Continue IV Lasix 40mg  bid  - Strict I&O  - Monitor renal function - Continue cardiac monitoring - Continue metoprolol 12.5mg  bid  Antiphospholipid antibody syndrome with supratherapeutic INR: Patient presented with supratherapeutic INR with hemoptysis and hematuria.  His warfarin was held and patient started on heparin gtt. Currently INR of 1.9. No active bleeding noted. Hemoglobin has remained stable.  - Continue heparin gtt with goal of bridge to warfarin on discharge - Monitor for any signs of bleeding - Trending CBC  Hematuria:   On admission, patient endorsed one month of hematuria but unable to quantify how much. Denies any associated urinary symptoms. Abdominal exam benign. However, UA was significant for >50 RBCs. Suspect his supratherapeutic INR may have been contributing to his hematuria. CT Abd/Pelvis obtained that was significant for nonobstructing bilateral nephrolithiasis without hydronephrosis, renal cortical masses or evidence of urothelial lesions.  - Outpatient follow up with Urology for cystoscopy  Diet: CM Fluids/Electrolytes: None  DVT Prophylaxis: Therapeutic dose heparin gtt  Code status: FULL   Prior to Admission Living Arrangement: Home Anticipated Discharge Location: SNF Barriers to Discharge: Continued medical management  Dispo: Anticipated discharge pending clinical improvement   Eliezer Bottom, MD  IMTS PGY-2 01/21/2020, 6:20 AM Pager: [phone] After 5pm on weekdays and 1pm on weekends: On Call pager [phone]

## 2020-01-21 NOTE — Progress Notes (Signed)
Pt is not ready to go on BIPAP at this time. Will check again.

## 2020-01-21 NOTE — Progress Notes (Addendum)
ANTICOAGULATION CONSULT NOTE - Initial Consult  Pharmacy Consult for heparin gtt Indication: history of  pulmonary embolus  Allergies  Allergen Reactions  . Melatonin Anxiety  . Ramipril     cough    Patient Measurements: Height: 5' 7.01" (170.2 cm) Weight: 112.2 kg (247 lb 5.7 oz) IBW/kg (Calculated) : 66.12 Heparin Dosing Weight: 90.4kg  Vital Signs: Temp: 98.4 F (36.9 C) (11/07 0408) Temp Source: Axillary (11/07 0408) BP: 158/79 (11/07 0408) Pulse Rate: 85 (11/07 0408)  Labs: Recent Labs    01/19/20 0351 01/19/20 0351 01/20/20 0429 01/20/20 2126 01/21/20 0741  HGB 12.6*   < > 14.2  --  13.3  HCT 40.3  --  46.0  --  43.0  PLT 321  --  379  --  300  LABPROT 30.4*  --  22.4*  --  16.6*  INR 3.0*  --  2.1*  --  1.4*  HEPARINUNFRC  --   --   --  <0.10* <0.10*  CREATININE 0.70  --  0.69  --  0.67   < > = values in this interval not displayed.    Estimated Creatinine Clearance: 111.5 mL/min (by C-G formula based on SCr of 0.67 mg/dL).   Assessment: 64 yo M who takes warfarin PTA for history of PE with antiphospholipid syndrome with INR goal 2.0-3.0. Upon admission INR was supratherapeutic at 3.4. Warfarin was held and INR has drifted downward to 1.4 this AM. Heparin drip was initiated for therapeutic anticoagulation while inpatient.  PTA warfarin dose: 6mg  daily except 5mg  on Mon/Fri per last outpatient anticoagulation clinic note;although appears to be taking incorrectly at home perdiscussion with patient(6mg  daily except 7mg  on Mon/Fri)and has history of this issue per notes. When questioned on how he takes this medication, he states "randomly"  Per RN there is no visualized bleeding. No problems with line and heparin has not been turned off to their knowledge  CBC: Hgb 13.3; Plt 300  11/7 HL: undetectable (<0.10)-- subtherapeutic   Goal of Therapy:  Heparin level 0.3-0.7 units/ml Monitor platelets by anticoagulation protocol: Yes   Plan:  Increase  heparin infusion to 1850 units/hr 6hr HL at 1600 Monitor for s/sx bleeding, HL, CBC clinical progress, and transition to PO anitcoagulation as able   11/7 Addendum:  Pharmacy now consulted to bridge patient back to warfarin therapy. INR 1.4 (subtherapeutic, goal 2.0-3.0) today. Pt home regimen (above) is not strictly followed by the patient. For therapeutic bridging of heparin to warfarin, the INR will need to be therapeutic between 2.0-3.0 and then heparin gtt may be discontinued. Last dose of warfarin was 2.5mg  x1 on 11/5. Pt also on azithromycin 250mg , which has drug interaction with warfarin and may cause increased INR. Added stop date for 5 days of therapy, 11/8.   Warfarin Plan:  Continue with above heparin gtt plan Initiate pharmacist dosing of warfarin Give warfarin 6 mg x1 today  Daily INR  Monitor s/sx bleeding, CBC, and discharge plans   13/7, PharmD PGY1 Pharmacy Resident 01/21/2020 8:55 AM

## 2020-01-21 NOTE — Progress Notes (Signed)
ANTICOAGULATION CONSULT NOTE   Pharmacy Consult for heparin gtt Indication: history of  pulmonary embolus  Allergies  Allergen Reactions   Melatonin Anxiety   Ramipril     cough    Patient Measurements: Height: 5' 7.01" (170.2 cm) Weight: 112.2 kg (247 lb 5.7 oz) IBW/kg (Calculated) : 66.12 Heparin Dosing Weight: 90.4kg  Vital Signs: Temp: 98.6 F (37 C) (11/07 1206) BP: 120/68 (11/07 1206) Pulse Rate: 78 (11/07 1206)  Labs: Recent Labs    01/19/20 0351 01/19/20 0351 01/20/20 0429 01/20/20 2126 01/21/20 0741 01/21/20 1647  HGB 12.6*   < > 14.2  --  13.3  --   HCT 40.3  --  46.0  --  43.0  --   PLT 321  --  379  --  300  --   LABPROT 30.4*  --  22.4*  --  16.6*  --   INR 3.0*  --  2.1*  --  1.4*  --   HEPARINUNFRC  --   --   --  <0.10* <0.10* 0.31  CREATININE 0.70  --  0.69  --  0.67  --    < > = values in this interval not displayed.    Estimated Creatinine Clearance: 111.5 mL/min (by C-G formula based on SCr of 0.67 mg/dL).   Assessment: 64 yo M who takes warfarin PTA for history of PE with antiphospholipid syndrome with INR goal 2.0-3.0. Upon admission INR was supratherapeutic at 3.4. Warfarin was held and INR has drifted downward to 1.4 this AM. Heparin drip was initiated for therapeutic anticoagulation while inpatient.  PTA warfarin dose: 6mg  daily except 5mg  on Mon/Fri per last outpatient anticoagulation clinic note;although appears to be taking incorrectly at home perdiscussion with patient(6mg  daily except 7mg  on Mon/Fri)and has history of this issue per notes. When questioned on how he takes this medication, he states "randomly"  Per RN there is no visualized bleeding. No problems with line and heparin has not been turned off to their knowledge  CBC: Hgb 13.3; Plt 300  PM f/u > heparin level 0.31 (at goal), no overt bleeding or complications noted.   Goal of Therapy:  Heparin level 0.3-0.7 units/ml Monitor platelets by anticoagulation  protocol: Yes   Plan:  Continue IV heparin at current rate. Daily heparin level and CBC.  , , [facility] Clinical Pharmacist  01/21/2020 7:15 PM   [facility] pharmacy phone numbers are listed on [email or web address]

## 2020-01-21 NOTE — Progress Notes (Signed)
Pt placed on the BIPAP and he is tolerating it well at this time.

## 2020-01-22 ENCOUNTER — Ambulatory Visit: Payer: BC Managed Care – PPO

## 2020-01-22 DIAGNOSIS — G4733 Obstructive sleep apnea (adult) (pediatric): Secondary | ICD-10-CM

## 2020-01-22 DIAGNOSIS — G9341 Metabolic encephalopathy: Secondary | ICD-10-CM

## 2020-01-22 DIAGNOSIS — J9622 Acute and chronic respiratory failure with hypercapnia: Secondary | ICD-10-CM

## 2020-01-22 LAB — BASIC METABOLIC PANEL
Anion gap: 10 (ref 5–15)
BUN: 22 mg/dL (ref 8–23)
CO2: 35 mmol/L — ABNORMAL HIGH (ref 22–32)
Calcium: 8.8 mg/dL — ABNORMAL LOW (ref 8.9–10.3)
Chloride: 96 mmol/L — ABNORMAL LOW (ref 98–111)
Creatinine, Ser: 1.1 mg/dL (ref 0.61–1.24)
GFR, Estimated: >60 mL/min (ref >60)
Glucose, Bld: 207 mg/dL — ABNORMAL HIGH (ref 70–99)
Potassium: 3.3 mmol/L — ABNORMAL LOW (ref 3.5–5.1)
Sodium: 141 mmol/L (ref 135–145)

## 2020-01-22 LAB — CBC
HCT: 41.8 % (ref 39.0–52.0)
Hemoglobin: 12.8 g/dL — ABNORMAL LOW (ref 13.0–17.0)
MCH: 29.4 pg (ref 26.0–34.0)
MCHC: 30.6 g/dL (ref 30.0–36.0)
MCV: 96.1 fL (ref 80.0–100.0)
Platelets: 339 10*3/uL (ref 150–400)
RBC: 4.35 MIL/uL (ref 4.22–5.81)
RDW: 14.6 % (ref 11.5–15.5)
WBC: 11.3 10*3/uL — ABNORMAL HIGH (ref 4.0–10.5)
nRBC: 0 % (ref 0.0–0.2)

## 2020-01-22 LAB — HEPARIN LEVEL (UNFRACTIONATED): Heparin Unfractionated: 0.37 IU/mL (ref 0.30–0.70)

## 2020-01-22 LAB — PROTIME-INR
INR: 1.3 — ABNORMAL HIGH (ref 0.8–1.2)
Prothrombin Time: 16 seconds — ABNORMAL HIGH (ref 11.4–15.2)

## 2020-01-22 LAB — GLUCOSE, CAPILLARY
Glucose-Capillary: 181 mg/dL — ABNORMAL HIGH (ref 70–99)
Glucose-Capillary: 138 mg/dL — ABNORMAL HIGH (ref 70–99)
Glucose-Capillary: 150 mg/dL — ABNORMAL HIGH (ref 70–99)

## 2020-01-22 MED ORDER — WARFARIN SODIUM 6 MG PO TABS: 6.0000 mg | ORAL_TABLET | Freq: Every day | ORAL | 0 refills | Status: DC

## 2020-01-22 MED ORDER — ENOXAPARIN SODIUM 120 MG/0.8ML ~~LOC~~ SOLN
110.0000 mg | Freq: Two times a day (BID) | SUBCUTANEOUS | Status: DC
  Administered 2020-01-22: 110 mg via SUBCUTANEOUS
  Filled 2020-01-22 (×2): qty 0.74

## 2020-01-22 MED ORDER — ENOXAPARIN SODIUM 30 MG/0.3ML ~~LOC~~ SOLN: 110.0000 mg | Freq: Two times a day (BID) | SUBCUTANEOUS | 0 refills | Status: DC

## 2020-01-22 MED ORDER — POTASSIUM CHLORIDE 20 MEQ PO PACK: 40.0000 meq | PACK | Freq: Two times a day (BID) | ORAL | 0 refills | Status: DC

## 2020-01-22 MED ORDER — POTASSIUM CHLORIDE 20 MEQ PO PACK
40.0000 meq | PACK | Freq: Two times a day (BID) | ORAL | Status: DC
  Administered 2020-01-22: 40 meq via ORAL
  Filled 2020-01-22: qty 2

## 2020-01-22 MED ORDER — WARFARIN SODIUM 7.5 MG PO TABS: 7.5000 mg | ORAL_TABLET | Freq: Every day | ORAL | 0 refills | Status: DC

## 2020-01-22 MED ORDER — ASPIRIN 81 MG PO TBEC: 81.0000 mg | DELAYED_RELEASE_TABLET | Freq: Every day | ORAL | 11 refills | Status: DC

## 2020-01-22 MED ORDER — WARFARIN SODIUM 7.5 MG PO TABS
7.5000 mg | ORAL_TABLET | Freq: Once | ORAL | Status: AC
  Administered 2020-01-22: 7.5 mg via ORAL
  Filled 2020-01-22: qty 1

## 2020-01-22 NOTE — Progress Notes (Signed)
Subjective: HD 4 Overnight, patient remained on BiPAP.  He is feeling better this morning.  He notes he has BiPAP and CPAP at home and is able to use this at home. He follows up with [facility] (Asthma department?) Warfarin is managed by his PCP  Has previously done Lovenox bridge and is comfortable with doing them. He follows with Dr Ether Griffins and is able to monitor his warfarin bridging.   Recommend discharge to home with INR check on either Wednesday or Thursday.   Objective:  Vital signs in last 24 hours: Vitals:   01/21/20 2330 01/22/20 0436 01/22/20 0516 01/22/20 0738  BP:   127/71   Pulse: 95 88 74   Resp: 18 18 20 20   Temp:   97.9 F (36.6 C)   TempSrc:   Axillary   SpO2: 95% 95% 98%   Weight:      Height:       Physical Exam  Constitutional: Appears well-developed and well-nourished. NAD HENT: Normocephalic and atraumatic, EOMI, conjunctiva normal, moist mucous membranes; on 4L [place].  Cardiovascular: Normal rate, regular rhythm.  Distal pulses intact Respiratory: No respiratory distress, no accessory muscle use, on 4L [place].  Musculoskeletal: Normal bulk and tone.  Minimal bilateral pitting edema to mid shin Neurological: awake, alert and orientedx4;  no apparent focal deficits noted. Skin: Warm and dry.  No rash, erythema, lesions noted.  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   Principal Problem:   Community acquired pneumonia Active Problems:   Anti-phospholipid syndrome (HCC)   COPD (chronic obstructive pulmonary disease) (HCC)   Non-insulin treated type 2 diabetes mellitus (HCC)   Acute on chronic right heart failure (HCC)  Acute on chronic hypoxic and hypercapneic respiratory failure in  setting of community acquired pneumonia:  He is still endorsing productive cough but sitting comfortably on chair with BiPAP machine. BiPAP was discontinued this morning during examination and he is started back on 4L supplemental oxygen at this time.CXR with medial right lung base opacity. CT Abdomen was significant for patchy consolidation at the right lung base suggestive of multilobar bronchopneumonia.  Remains afebrile and WBC's 11.3. However, continues to require 4L supplemental oxygen at this time.  - Continue azithromycin and ceftriaxone day 4 and planning to stop it.  - Duonebs q6h  - Continue albuterol inhaler q4h prn  Acute metabolic encephalopathy 2/2 hypercarbia from persistent COPD:  He has a history of COPD and obstructive sleep apnea. He was noted to have worsening respiratory acidosis on 11/6 for which he was initiated on BiPAP. Subsequently, had improvement in mental status this morning. He notes that he has both BiPAP and CPAP at home and planning to use it after discharge. - BiPAP/CPAP at night - Delirium precautions continued   Acute on chronic HFpEF exacerbation: Patient with history of HFpEF on torsemide 20mg  qd at home. He has been diuresed with IV Lasix 40mg  twice daily;  he is noted to have ~500cc UOP over past 24 hours.. On examination, has improved bilateral lower extremity edema. His Creatinine is 1.10, BUN 22.  - Stop IV Lasix  - Strict I&O  - Monitor renal function - Continue cardiac monitoring - Continue metoprolol 12.5mg  bid  Antiphospholipid antibody syndrome with supratherapeutic INR: Patient  presented with supratherapeutic INR with hemoptysis and hematuria. His warfarin was held and patient started on heparin gtt. Currently INR of 1.3. No active bleeding noted. Hemoglobin has remained stable.  - Stop IV Heparin today and bridge to warfarin along with Lovenox bridge.  - Monitor for any signs of bleeding - Trending CBC  Hematuria:  On admission, patient  endorsed one month of hematuria but unable to quantify how much. Denies any associated urinary symptoms. Abdominal exam benign. However, UA was significant for >50 RBCs. Suspect his supratherapeutic INR may have been contributing to his hematuria. CT Abd/Pelvis obtained that was significant for nonobstructing bilateral nephrolithiasis without hydronephrosis, renal cortical masses or evidence of urothelial lesions.  - Outpatient follow up with Urology for cystoscopy Prior to Admission Living Arrangement: Home Anticipated Discharge Location: Home Barriers to Discharge: None Dispo: Anticipated discharge today.  Arnoldo Lenis, MD 01/22/2020, 1:31 PM Pager: [phone] After 5pm on weekdays and 1pm on weekends: On Call pager [phone]

## 2020-01-22 NOTE — Hospital Course (Signed)
Acute on chronic hypoxic and hypercapneic respiratory failure in setting of community acquired pneumonia:  Presenting with dyspnea, productive cough with hemoptysis. CXR with medial right lung base opacity. CT Abdomen was significant for patchy consolidation at the right lung base suggestive of multilobar bronchopneumonia. Continues to require 4L supplemental oxygen at this time. He used  BiPAP at night as needed.  He was started on azithromycin and ceftriaxone and stopped on day 4. Continued on Duonebs q6h and albuterol inhaler q4h prn.   Acute metabolic encephalopathy 2/2 hypercarbia from persistent COPD:  He has a history of COPD and obstructive sleep apnea. He was noted to have worsening respiratory acidosis on 11/6 for which he was initiated on BiPAP. Subsequently, had improvement in mental status. He notes that he has both BiPAP and CPAP at home and planning to use it after discharge at night as needed.   Acute on chronic HFpEF exacerbation: He has history of HFpEF and on torsemide 20mg  qd at home. He has been diuresed with IV Lasix 40mg  twice daily;  he is noted to have ~500cc UOP over past 24 hours. On examination, has improved bilateral lower extremity edema. His Creatinine is 1.10, BUN 22 on the day of discharge. His IV lasix were stopped and he is asked to ee his PCP before starting his home dose torsemide if needed.    Antiphospholipid antibody syndrome with supratherapeutic INR: Patient presented with supratherapeutic INR with hemoptysis and hematuria. His warfarin was held and patient started on heparin gtt. Currently INR of 1.3. No active bleeding noted. Hemoglobin has remained stable. His  IV Heparin was stopped on 01/22/20. He was started on Lovenox 110 mg IV q12hr - until he follows up with his Coumadin Clinic. Plan is to take Warfarin 7.5 mg po x 1 on 01/22/20 and then start 6 mg po daily until he follows up with his Coumadin clinic   Hematuria:  On admission, patient endorsed one  month of hematuria but unable to quantify how much. Denies any associated urinary symptoms. Abdominal exam benign. However, UA was significant for >50 RBCs. Suspect his supratherapeutic INR may have been contributing to his hematuria. CT Abd/Pelvis obtained that was significant for nonobstructing bilateral nephrolithiasis without hydronephrosis, renal cortical masses or evidence of urothelial lesions.  - Outpatient follow up with Urology for cystoscopy

## 2020-01-22 NOTE — Progress Notes (Addendum)
ANTICOAGULATION CONSULT NOTE   Pharmacy Consult for heparin gtt to Lovenox Indication: history of  pulmonary embolus  Allergies  Allergen Reactions  . Melatonin Anxiety  . Ramipril     cough    Patient Measurements: Height: 5' 7.01" (170.2 cm) Weight: 112.2 kg (247 lb 5.7 oz) IBW/kg (Calculated) : 66.12 Heparin Dosing Weight: 90.4kg  Vital Signs: Temp: 97.9 F (36.6 C) (11/08 0516) Temp Source: Axillary (11/08 0516) BP: 127/71 (11/08 0516) Pulse Rate: 74 (11/08 0516)  Labs: Recent Labs    01/20/20 0429 01/20/20 2126 01/21/20 0741 01/21/20 1647 01/22/20 0204  HGB 14.2  --  13.3  --  12.8*  HCT 46.0  --  43.0  --  41.8  PLT 379  --  300  --  339  LABPROT 22.4*  --  16.6*  --  16.0*  INR 2.1*  --  1.4*  --  1.3*  HEPARINUNFRC  --    < > <0.10* 0.31 0.37  CREATININE 0.69  --  0.67  --  1.10   < > = values in this interval not displayed.    Estimated Creatinine Clearance: 81.1 mL/min (by C-G formula based on SCr of 1.1 mg/dL).   Assessment: 64 yo M who takes warfarin PTA for history of PE with antiphospholipid syndrome with INR goal 2.0-3.0. Upon admission INR was supratherapeutic at 3.4. Warfarin was held and INR has drifted downward to 1.4 this AM. Heparin drip was initiated for therapeutic anticoagulation while inpatient.  PTA warfarin dose: 6mg  daily except 5mg  on Mon/Fri per last outpatient anticoagulation clinic note;although appears to be taking incorrectly at home perdiscussion with patient(6mg  daily except 7mg  on Mon/Fri)and has history of this issue per notes. When questioned on how he takes this medication, he states "randomly"  Per RN there is no visualized bleeding. No problems with line and heparin has not been turned off to their knowledge  CBC: Hgb 12.8; Plt 339  AM f/u > heparin level 0.37 (at goal), no overt bleeding or complications noted. INR - went down to 1.3  Goal of Therapy:  Heparin level 0.3-0.7 units/ml INR goal 2.0 -  3.0 Monitor platelets by anticoagulation protocol: Yes   Plan:  Warfarin 7.5 mg po x 1 tonight Continue IV heparin at 1850 units/hr. Daily heparin level, INR and CBC.  , PharmD, [facility] Clinical Pharmacist Please see AMION for all Pharmacists' Contact Phone Numbers 01/22/2020, 7:48 AM   ADDENDUM:  Pharmacy consulted to transition patient to Lovenox.  Plan: 1. Start Lovenox 110 mg IV q12hr - until he follows up with his Coumadin Clinic 2. D/c Heparin drip 3. Give Warfarin 7.5 mg po x 1 tonight and then start 6 mg po daily until he follows up with his Coumadin clinic  Jeanella Cara, PharmD, [facility] Clinical Pharmacist Please see AMION for all Pharmacists' Contact Phone Numbers 01/22/2020, 1:31 PM

## 2020-01-22 NOTE — TOC Progression Note (Signed)
Transition of Care [facility]) - Progression Note    Patient Details  Name: Jose [name] MRN: [id number] Date of Birth: [birth year]  Transition of Care [facility]) CM/SW Contact  Beckie Busing, RN Phone Number: [phone]  01/22/2020, 3:00 PM  Clinical Narrative:    Patient to be discharged today. MD has asked if patient can receive covid vaccine prior to d/c. Message has been sent to the homebound covid team.  Expected Discharge Plan: Skilled Nursing Facility Barriers to Discharge: Continued Medical Work up, English as a second language teacher  Expected Discharge Plan and Services Expected Discharge Plan: Skilled Nursing Facility In-house Referral: Clinical Social Work     Living arrangements for the past 2 months: Single Family Home                                       Social Determinants of Health (SDOH) Interventions    Readmission Risk Interventions No flowsheet data found.

## 2020-01-22 NOTE — Discharge Summary (Addendum)
Name: Koby Pickup MRN: [id number] DOB: 12-25-1955 64 y.o. PCP: Elenore Paddy, MD  Date of Admission: 01/18/2020  1:57 PM Date of Discharge: 01/23/2020 Attending Physician: Lalla Brothers, MD  Discharge Diagnosis: 1. Community acquired pneumonia 2. COPD (chronic obstructive pulmonary disease) (HCC) exacerbation. 3. Non-insulin treated type 2 diabetes mellitus ([place]) 4.  Acute on chronic right heart failure ([place]) 5. Anti-phospholipid syndrome ([place]) 6. Diabetes Mellitus    Discharge Medications: Allergies as of 01/22/2020       Reactions   Melatonin Anxiety   Ramipril    cough        Medication List     STOP taking these medications    doxycycline 100 MG tablet Commonly known as: VIBRA-TABS   enoxaparin 120 MG/0.8ML injection Commonly known as: LOVENOX Replaced by: enoxaparin 30 MG/0.3ML injection   torsemide 20 MG tablet Commonly known as: DEMADEX       TAKE these medications    accu-chek soft touch lancets 2 (two) times daily.   aspirin 81 MG EC tablet Take 1 tablet (81 mg total) by mouth daily. Swallow whole. What changed: additional instructions   Contour Next Test test strip Generic drug: glucose blood Use to check blood sugar twice 2 time(s) daily.   DULoxetine 60 MG capsule Commonly known as: CYMBALTA Take 60 mg by mouth once a week.   enoxaparin 30 MG/0.3ML injection Commonly known as: Lovenox Inject 1.1 mLs (110 mg total) into the skin 2 (two) times daily for 2 days. Replaces: enoxaparin 120 MG/0.8ML injection   Flovent Diskus 100 MCG/BLIST Aepb Generic drug: Fluticasone Propionate (Inhal) Inhale 1 puff into the lungs daily as needed (wheezing & shortness of breath).   gabapentin 600 MG tablet Commonly known as: NEURONTIN Take 600 mg by mouth 3 (three) times daily.   GlucoCom Blood Glucose Monitor Devi 1 each by XX route as directed (For Blood Glucose Monitoring)   Contour Next Monitor w/Device Kit by Does not apply route.    HYDROcodone-acetaminophen 10-325 MG tablet Commonly known as: NORCO Take 1 tablet by mouth 2 (two) times daily.   ipratropium-albuterol 0.5-2.5 (3) MG/3ML Soln Commonly known as: DUONEB Inhale 3 mLs into the lungs at bedtime.   metoprolol tartrate 12.5 mg Tabs tablet Commonly known as: LOPRESSOR Take 12.5 mg by mouth 2 (two) times daily.   nicotine 14 mg/24hr patch Commonly known as: NICODERM CQ - dosed in mg/24 hours Apply one patch daily x 2 weeks.  Begin after completion of 21 mg patches   nitroGLYCERIN 0.4 MG SL tablet Commonly known as: NITROSTAT Place 0.4 mg under the tongue every 5 (five) minutes as needed for chest pain.   potassium chloride 20 MEQ packet Commonly known as: KLOR-CON Take 40 mEq by mouth 2 (two) times daily.   albuterol 108 (90 Base) MCG/ACT inhaler Commonly known as: VENTOLIN HFA Inhale 1 puff into the lungs every 6 (six) hours as needed for wheezing or shortness of breath.   ProAir HFA 108 (90 Base) MCG/ACT inhaler Generic drug: albuterol Inhale 1-2 puffs into the lungs every 4 (four) hours as needed for wheezing.   albuterol (5 MG/ML) 0.5% nebulizer solution Commonly known as: PROVENTIL Take 0.5 mLs (2.5 mg total) by nebulization every 6 (six) hours as needed for wheezing or shortness of breath.   rosuvastatin 10 MG tablet Commonly known as: CRESTOR Take 10 mg by mouth every evening.   Stiolto Respimat 2.5-2.5 MCG/ACT Aers Generic drug: Tiotropium Bromide-Olodaterol Inhale 2 puffs into the lungs daily.  tamsulosin 0.4 MG Caps capsule Commonly known as: FLOMAX Take 0.4 mg by mouth daily.   tiZANidine 4 MG tablet Commonly known as: ZANAFLEX Take 8 mg by mouth in the morning and at bedtime.   topiramate 50 MG tablet Commonly known as: TOPAMAX Take 50 mg by mouth 2 (two) times daily.   warfarin 7.5 MG tablet Commonly known as: COUMADIN Take 1 tablet (7.5 mg total) by mouth at bedtime for 1 dose. What changed:  medication  strength how much to take when to take this additional instructions   warfarin 6 MG tablet Commonly known as: Coumadin Take 1 tablet (6 mg total) by mouth daily for 2 days. What changed:  medication strength how much to take additional instructions        Disposition and follow-up:   Mr.Brice Crossley was discharged from [facility] in Stable condition.  At the hospital follow up visit please address:  1. A).  Acute on chronic hypoxic and hypercapneic respiratory failure in setting of community acquired pneumonia: CAP treated with antibiotics  azithromycin and ceftriaxone and stopped on day 4. Continue using BiPAP at night if needed. B). Acute metabolic encephalopathy 2/2 hypercarbia from persistent COPD: BiPAP was used at night and lead to improve in status. Continue using BiPAP at night if needed. C). Acute on chronic HFpEF exacerbation: Please see  PCP before starting home dose torsemide if needed.  D). Antiphospholipid antibody syndrome with supratherapeutic INR:   IV Heparin was stopped on 01/22/20. Started on Lovenox 110 mg IV q12hr - until  follows up with his Coumadin Clinic. Plan is to take Warfarin 7.5 mg po x 1 on 01/22/20 and then start 6 mg po daily until  follows up with his Coumadin clinic on Thursday @ 9:30 am E). Hematuria: Outpatient follow up with Urology for cystoscopy F). Diabetes Mellitus: Insulin aspart  0-15 units was started in hospital and discontinued. Please see your PCP for further management of DM.    2.  Labs / imaging needed at time of follow-up: INR, cystoscopy  3.  Pending labs/ test needing follow-up: None  Follow-up Appointments:  Follow-up Information     Elenore Paddy, MD. Go on 01/25/2020.   Specialty: Internal Medicine Why: $RemoveBefor'@9'HZnTZmluAqlr$ :30 AM Contact information: [place] [id number] [place] Hospital Course by problem list: Acute on chronic hypoxic and hypercapneic  respiratory failure in setting of community acquired pneumonia:  Presenting with dyspnea, productive cough with hemoptysis. CXR with medial right lung base opacity. CT Abdomen was significant for patchy consolidation at the right lung base suggestive of multilobar bronchopneumonia. Continues to require 4L supplemental oxygen at this time. He used  BiPAP at night as needed.  He was started on azithromycin and ceftriaxone and stopped on day 4. Continued on Duonebs q6h and albuterol inhaler q4h prn.   Acute metabolic encephalopathy 2/2 hypercarbia from persistent COPD:  He has a history of COPD and obstructive sleep apnea. He was noted to have worsening respiratory acidosis on 11/6 for which he was initiated on BiPAP. Subsequently, had improvement in mental status. He notes that he has both BiPAP and CPAP at home and planning to use it after discharge at night as needed.   Acute on chronic HFpEF exacerbation: He has history of HFpEF and on torsemide $RemoveBefo'20mg'iWytrtumuxI$  qd at home. He has been diuresed with IV Lasix $Remove'40mg'XhfbonB$  twice daily;  he  is noted to have ~500cc UOP over past 24 hours. On examination, has improved bilateral lower extremity edema. His Creatinine is 1.10, BUN 22 on the day of discharge. His IV lasix were stopped and he is asked to see his PCP before starting his home dose torsemide if needed.    Antiphospholipid antibody syndrome with supratherapeutic INR: Patient presented with supratherapeutic INR with hemoptysis and hematuria. His warfarin was held and patient started on heparin gtt. Currently INR of 1.3. No active bleeding noted. Hemoglobin has remained stable. His  IV Heparin was stopped on 01/22/20. He was started on Lovenox 110 mg IV q12hr - until he follows up with his Coumadin Clinic. Plan is to take Warfarin 7.5 mg po x 1 on 01/22/20 and then start 6 mg po daily until he follows up with his Coumadin clinic   Hematuria:  On admission, patient endorsed one month of hematuria but unable to quantify how  much. Denies any associated urinary symptoms. Abdominal exam benign. However, UA was significant for >50 RBCs. Suspect his supratherapeutic INR may have been contributing to his hematuria. CT Abd/Pelvis obtained that was significant for nonobstructing bilateral nephrolithiasis without hydronephrosis, renal cortical masses or evidence of urothelial lesions.  - Outpatient follow up with Urology for cystoscopy  Diabetes Mellitus:CBG monitoring was done & Insulin aspart was started in hospital for control of blood glucose and was discontinued on discharge.   Discharge Vitals:   BP 127/70 (BP Location: Left Wrist)   Pulse 86   Temp 98.7 F (37.1 C)   Resp 16   Ht 5' 7.01" (1.702 m)   Wt 247 lb 5.7 oz (112.2 kg)   SpO2 92%   BMI 38.73 kg/m   Pertinent Labs, Studies, and Procedures:  CBC Latest Ref Rng & Units 01/22/2020 01/21/2020 01/20/2020  WBC 4.0 - 10.5 K/uL 11.3(H) 10.6(H) 12.3(H)  Hemoglobin 13.0 - 17.0 g/dL 12.8(L) 13.3 14.2  Hematocrit 39 - 52 % 41.8 43.0 46.0  Platelets 150 - 400 K/uL 339 300 379   BMP Latest Ref Rng & Units 01/22/2020 01/21/2020 01/20/2020  Glucose 70 - 99 mg/dL 207(H) 145(H) 176(H)  BUN 8 - 23 mg/dL $Remove'22 16 14  'UPCuhzG$ Creatinine 0.61 - 1.24 mg/dL 1.10 0.67 0.69  Sodium 135 - 145 mmol/L 141 141 138  Potassium 3.5 - 5.1 mmol/L 3.3(L) 3.8 3.9  Chloride 98 - 111 mmol/L 96(L) 98 98  CO2 22 - 32 mmol/L 35(H) 31 31  Calcium 8.9 - 10.3 mg/dL 8.8(L) 8.9 9.2   Prothrombin Time: 16 (on 01/22/20), 16.6 (01/21/20), 22.4 ( 01/20/20) INR: 1.3 (on 01/22/20), 1.4 (01/21/20), 2.1 ( 01/20/20)    Chest Xray : IMPRESSION: Mild ill-defined opacity within the medial right lung base. Findings may reflect atelectasis or pneumonia. PA and lateral view chest radiograph with improved inspiratory effort may be helpful for further evaluation.  Unchanged cardiomegaly. CT abdomen pelvis: IMPRESSION: 1. Nonobstructing bilateral nephrolithiasis. No hydronephrosis. No ureteral or bladder stones. No  renal cortical masses. No evidence of urothelial lesions. 2. Patchy consolidation at the right lung base. Patchy tree-in-bud opacities in the dependent basilar left lower lobe. Findings are suggestive of multilobar bronchopneumonia. Chest radiograph or chest CT follow-up suggested in 2-3 months. 3. Mild bilateral external iliac lymphadenopathy, nonspecific. Suggest follow-up CT abdomen/pelvis with oral and IV contrast in 3 months. This recommendation follows ACR consensus guidelines: White Paper of the ACR Incidental Findings Committee II on Splenic and Nodal Findings. J Am Coll Radiol 2013;10:789-794. 4. Diffuse hepatic steatosis. 5. Mild colonic  diverticulosis. 6. Coronary atherosclerosis. 7. Aortic Atherosclerosis (ICD10-I70.0).    Discharge Instructions: Discharge Instructions     Call MD for:  temperature >100.4   Complete by: As directed    Diet - low sodium heart healthy   Complete by: As directed    Discharge instructions   Complete by: As directed    Mr. Havey: You presented with Acute on chronic hypoxic and hypercapneic respiratory failure in setting of community acquired pneumonia: You were started on azithromycin and ceftriaxone for t/t of pneumonia and started back on 4L supplemental oxygen .CXR with medial right lung base opacity. CT Abdomen was significant for patchy consolidation at the right lung base suggestive of multilobar bronchopneumonia. Plan is to stop antibiotics today but continue Duonebs q6h and albuterol inhaler q4h prn. You had Acute metabolic encephalopathy 2/2 hypercarbia from persistent COPD: You have a  history of COPD and obstructive sleep apnea. Noted to have worsening respiratory acidosis on 11/6 for which you were initiated on BiPAP. Subsequently, had improvement in mental status. You stated you have both BiPAP and CPAP at home and planning to use it after discharge at night as needed. Acute on chronic HFpEF exacerbation: You have history of HFpEF on  torsemide 20mg  qd at home. You were diuresed with IV Lasix 40mg  twice daily;  noted to have ~500cc UOP over past 24 hours.. On examination, had improved bilateral lower extremity edema.Creatinine is 1.10, BUN 22. Plan is to stop lasix today and see Cardiology before restarting them but continue metoprolol 12.5mg  bid  Antiphospholipid antibody syndrome with supratherapeutic INR: You presented with supratherapeutic INR with hemoptysis and hematuria.  Warfarin was held and were started on heparin gtt. Currently INR of 1.3. No active bleeding noted. Hemoglobin has remained stable.  - Stop IV Heparin today and bridge to warfarin along with Lovenox bridge. Lovenox 110 mg IV q12 hr until you follow your coumadin clinic. Take Warfarin 7.5 mg 1 dose tonight and the start 6 mg PO daily until you follow your coumadin clinic.     Hematuria:  You endorsed one month of hematuria but unable to quantify how much. Denied any associated urinary symptoms. Abdominal exam benign. However, UA was significant for >50 RBCs. Suspect his supratherapeutic INR may have been contributing to his hematuria. CT Abd/Pelvis obtained that was significant for nonobstructing bilateral nephrolithiasis without hydronephrosis, renal cortical masses or evidence of urothelial lesions.  - Outpatient follow up with Urology for cystoscopy   Increase activity slowly   Complete by: As directed        Signed: Honor Junes, MD 01/23/2020, 7:16 AM   Pager: [phone]

## 2020-01-23 LAB — CULTURE, BLOOD (ROUTINE X 2)
Culture: NO GROWTH
Culture: NO GROWTH

## 2020-01-24 ENCOUNTER — Ambulatory Visit: Payer: BC Managed Care – PPO

## 2020-01-24 ENCOUNTER — Encounter: Payer: Self-pay | Admitting: *Deleted

## 2020-01-24 DIAGNOSIS — J449 Chronic obstructive pulmonary disease, unspecified: Secondary | ICD-10-CM

## 2020-01-25 ENCOUNTER — Ambulatory Visit: Payer: BC Managed Care – PPO

## 2020-01-25 DIAGNOSIS — E041 Nontoxic single thyroid nodule: Secondary | ICD-10-CM | POA: Insufficient documentation

## 2020-01-29 ENCOUNTER — Ambulatory Visit: Payer: BC Managed Care – PPO

## 2020-01-31 ENCOUNTER — Ambulatory Visit: Payer: BC Managed Care – PPO

## 2020-02-01 ENCOUNTER — Ambulatory Visit: Payer: BC Managed Care – PPO

## 2020-02-01 ENCOUNTER — Encounter: Payer: Self-pay | Admitting: *Deleted

## 2020-02-01 DIAGNOSIS — J449 Chronic obstructive pulmonary disease, unspecified: Secondary | ICD-10-CM

## 2020-02-05 ENCOUNTER — Ambulatory Visit: Payer: BC Managed Care – PPO

## 2020-02-07 ENCOUNTER — Ambulatory Visit: Payer: BC Managed Care – PPO

## 2020-02-12 ENCOUNTER — Ambulatory Visit: Payer: BC Managed Care – PPO

## 2020-02-14 ENCOUNTER — Ambulatory Visit: Payer: BC Managed Care – PPO

## 2020-02-14 ENCOUNTER — Encounter: Payer: Self-pay | Admitting: *Deleted

## 2020-02-14 DIAGNOSIS — J449 Chronic obstructive pulmonary disease, unspecified: Secondary | ICD-10-CM

## 2020-02-14 NOTE — Progress Notes (Signed)
Pulmonary Individual Treatment Plan  Patient Details  Name: Jose [name] MRN: [id number] Date of Birth: 27-Dec-1955 Referring Provider:     Pulmonary Rehab from 01/09/2020 in [facility] Cardiac and Pulmonary Rehab  Referring Provider Jose Fray MD      Initial Encounter Date:    Pulmonary Rehab from 01/09/2020 in [facility] Cardiac and Pulmonary Rehab  Date 01/09/20      Visit Diagnosis: Chronic obstructive pulmonary disease, unspecified COPD type (Jose [name])  Patient's Home Medications on Admission:  Current Outpatient Medications:  .  albuterol (PROVENTIL) (5 MG/ML) 0.5% nebulizer solution, Take 0.5 mLs (2.5 mg total) by nebulization every 6 (six) hours as needed for wheezing or shortness of breath., Disp: 20 mL, Rfl: 12 .  albuterol (VENTOLIN HFA) 108 (90 Base) MCG/ACT inhaler, Inhale 1 puff into the lungs every 6 (six) hours as needed for wheezing or shortness of breath., Disp: , Rfl:  .  aspirin EC 81 MG EC tablet, Take 1 tablet (81 mg total) by mouth daily. Swallow whole., Disp: 30 tablet, Rfl: 11 .  Blood Glucose Monitoring Suppl (CONTOUR NEXT MONITOR) w/Device KIT, by Does not apply route., Disp: , Rfl:  .  Blood Glucose Monitoring Suppl (GLUCOCOM BLOOD GLUCOSE MONITOR) DEVI, 1 each by XX route as directed (For Blood Glucose Monitoring), Disp: , Rfl:  .  DULoxetine (CYMBALTA) 60 MG capsule, Take 60 mg by mouth once a week. , Disp: , Rfl:  .  enoxaparin (LOVENOX) 30 MG/0.3ML injection, Inject 1.1 mLs (110 mg total) into the skin 2 (two) times daily for 2 days., Disp: 4.4 mL, Rfl: 0 .  Fluticasone Propionate, Inhal, (FLOVENT DISKUS) 100 MCG/BLIST AEPB, Inhale 1 puff into the lungs daily as needed (wheezing & shortness of breath)., Disp: , Rfl:  .  gabapentin (NEURONTIN) 600 MG tablet, Take 600 mg by mouth 3 (three) times daily., Disp: , Rfl:  .  glucose blood (CONTOUR NEXT TEST) test strip, Use to check blood sugar twice 2 time(s) daily., Disp: , Rfl:  .  HYDROcodone-acetaminophen (NORCO)  10-325 MG tablet, Take 1 tablet by mouth 2 (two) times daily. , Disp: , Rfl:  .  ipratropium-albuterol (DUONEB) 0.5-2.5 (3) MG/3ML SOLN, Inhale 3 mLs into the lungs at bedtime. , Disp: , Rfl:  .  Lancets (ACCU-CHEK SOFT TOUCH) lancets, 2 (two) times daily., Disp: , Rfl:  .  metoprolol tartrate (LOPRESSOR) 12.5 mg TABS tablet, Take 12.5 mg by mouth 2 (two) times daily.  (Patient not taking: Reported on 01/18/2020), Disp: , Rfl:  .  nicotine (NICODERM CQ - DOSED IN MG/24 HOURS) 14 mg/24hr patch, Apply one patch daily x 2 weeks.  Begin after completion of 21 mg patches, Disp: , Rfl:  .  nitroGLYCERIN (NITROSTAT) 0.4 MG SL tablet, Place 0.4 mg under the tongue every 5 (five) minutes as needed for chest pain. , Disp: , Rfl: 1 .  potassium chloride (KLOR-CON) 20 MEQ packet, Take 40 mEq by mouth 2 (two) times daily., Disp: 2 packet, Rfl: 0 .  PROAIR HFA 108 (90 Base) MCG/ACT inhaler, Inhale 1-2 puffs into the lungs every 4 (four) hours as needed for wheezing. , Disp: , Rfl: 12 .  rosuvastatin (CRESTOR) 10 MG tablet, Take 10 mg by mouth every evening., Disp: , Rfl: 11 .  tamsulosin (FLOMAX) 0.4 MG CAPS capsule, Take 0.4 mg by mouth daily., Disp: , Rfl: 0 .  Tiotropium Bromide-Olodaterol (STIOLTO RESPIMAT) 2.5-2.5 MCG/ACT AERS, Inhale 2 puffs into the lungs daily. , Disp: , Rfl:  .  tiZANidine (  ZANAFLEX) 4 MG tablet, Take 8 mg by mouth in the morning and at bedtime. , Disp: , Rfl:  .  topiramate (TOPAMAX) 50 MG tablet, Take 50 mg by mouth 2 (two) times daily., Disp: , Rfl: 0 .  warfarin (COUMADIN) 6 MG tablet, Take 1 tablet (6 mg total) by mouth daily for 2 days., Disp: 2 tablet, Rfl: 0 .  warfarin (COUMADIN) 7.5 MG tablet, Take 1 tablet (7.5 mg total) by mouth at bedtime for 1 dose., Disp: 1 tablet, Rfl: 0  Past Medical History: Past Medical History:  Diagnosis Date  . Antiphospholipid antibody syndrome ([place])   . CAD S/P percutaneous coronary angioplasty '98, '04, Feb 2017   [facility]  . Carotid arterial  disease ([place]) 10/2015   moderate bilateral  . COPD (chronic obstructive pulmonary disease) ([place])   . Coronary artery disease   . Hypertension   . Non-insulin treated type 2 diabetes mellitus ([place])   . Pulmonary emboli ([place]) 2011  . RBBB   . Sleep apnea     Tobacco Use: Social History   Tobacco Use  Smoking Status Current Every Day Smoker  . Packs/day: 0.50  . Years: 40.00  . Pack years: 20.00  . Types: Cigarettes  . Last attempt to quit: 01/15/2019  . Years since quitting: 1.0  Smokeless Tobacco Never Used  Tobacco Comment   has started smoking again, willing to quit.    Labs: Recent Review Flowsheet Data    Labs for ITP Cardiac and Pulmonary Rehab Latest Ref Rng & Units 08/16/2016 12/27/2018 03/15/2019 01/19/2020 01/20/2020   Cholestrol 0 - 200 mg/dL 156 - - - -   LDLCALC 0 - 99 mg/dL 64 - - - -   HDL >40 mg/dL 28(L) - - - -   Trlycerides <150 mg/dL 322(H) - - - -   Hemoglobin A1c 4.8 - 5.6 % 5.9(H) - 6.9(H) 8.7(H) -   PHART 7.35 - 7.45 - - - - 7.302(L)   PCO2ART 32 - 48 mmHg - - - - 72.9(HH)   HCO3 20.0 - 28.0 mmol/L - 31.8(H) - - 35.0(H)   TCO2 22 - 32 mmol/L - 34(H) - - -   O2SAT % - 95.0 - - 94.9       Pulmonary Assessment Scores:  Pulmonary Assessment Scores    Row Name 01/09/20 1535         ADL UCSD   ADL Phase Entry     SOB Score total 114     Rest 1     Walk 4     Stairs 5     Bath 5     Dress 5     Shop 5       CAT Score   CAT Score 28       mMRC Score   mMRC Score 4            UCSD: Self-administered rating of dyspnea associated with activities of daily living (ADLs) 6-point scale (0 = "not at all" to 5 = "maximal or unable to do because of breathlessness")  Scoring Scores range from 0 to 120.  Minimally important difference is 5 units  CAT: CAT can identify the health impairment of COPD patients and is better correlated with disease progression.  CAT has a scoring range of zero to 40. The CAT score is classified into four groups of  low (less than 10), medium (10 - 20), high (21-30) and very high (31-40) based on the impact level  of disease on health status. A CAT score over 10 suggests significant symptoms.  A worsening CAT score could be explained by an exacerbation, poor medication adherence, poor inhaler technique, or progression of COPD or comorbid conditions.  CAT MCID is 2 points  mMRC: mMRC (Modified Medical Research Council) Dyspnea Scale is used to assess the degree of baseline functional disability in patients of respiratory disease due to dyspnea. No minimal important difference is established. A decrease in score of 1 point or greater is considered a positive change.   Pulmonary Function Assessment:  Pulmonary Function Assessment - 01/08/20 1347      Breath   Shortness of Breath Limiting activity;Yes           Exercise Target Goals: Exercise Program Goal: Individual exercise prescription set using results from initial 6 min walk test and THRR while considering  patient's activity barriers and safety.   Exercise Prescription Goal: Initial exercise prescription builds to 30-45 minutes a day of aerobic activity, 2-3 days per week.  Home exercise guidelines will be given to patient during program as part of exercise prescription that the participant will acknowledge.  Education: Aerobic Exercise: - Group verbal and visual presentation on the components of exercise prescription. Introduces F.I.T.T principle from ACSM for exercise prescriptions.  Reviews F.I.T.T. principles of aerobic exercise including progression. Written material given at graduation.   Cardiac Rehab from 02/16/2018 in [facility] Cardiac and Pulmonary Rehab  Date 10/18/17  Educator [facility]  Instruction Review Code 1- Verbalizes Understanding      Education: Resistance Exercise: - Group verbal and visual presentation on the components of exercise prescription. Introduces F.I.T.T principle from ACSM for exercise prescriptions  Reviews F.I.T.T.  principles of resistance exercise including progression. Written material given at graduation.    Education: Exercise & Equipment Safety: - Individual verbal instruction and demonstration of equipment use and safety with use of the equipment.   Pulmonary Rehab from 01/09/2020 in [facility] Cardiac and Pulmonary Rehab  Date 01/08/20  Educator [facility]  Instruction Review Code 1- Verbalizes Understanding      Education: Exercise Physiology & General Exercise Guidelines: - Group verbal and written instruction with models to review the exercise physiology of the cardiovascular system and associated critical values. Provides general exercise guidelines with specific guidelines to those with heart or lung disease.    Pulmonary Rehab from 01/09/2020 in [facility] Cardiac and Pulmonary Rehab  Education need identified 01/09/20      Education: Flexibility, Balance, Mind/Body Relaxation: - Group verbal and visual presentation with interactive activity on the components of exercise prescription. Introduces F.I.T.T principle from ACSM for exercise prescriptions. Reviews F.I.T.T. principles of flexibility and balance exercise training including progression. Also discusses the mind body connection.  Reviews various relaxation techniques to help reduce and manage stress (i.e. Deep breathing, progressive muscle relaxation, and visualization). Balance handout provided to take home. Written material given at graduation.   Cardiac Rehab from 02/16/2018 in [facility] Cardiac and Pulmonary Rehab  Date 02/07/18  Educator [facility]  Instruction Review Code 1- Verbalizes Understanding      Activity Barriers & Risk Stratification:  Activity Barriers & Cardiac Risk Stratification - 01/09/20 1529      Activity Barriers & Cardiac Risk Stratification   Activity Barriers Arthritis;Back Problems;Neck/Spine Problems;Muscular Weakness;Deconditioning;Balance Concerns;Shortness of Breath;Decreased Ventricular Function           6 Minute Walk:   6 Minute Walk    Row Name 01/09/20 1527         6 Minute Walk  Phase Initial     Distance 385 feet     Walk Time 5.02 minutes     # of Rest Breaks 3  9 sec, 12 sec, 8 sec, stopped at 5:30     MPH 0.87     METS 1     RPE 19     Perceived Dyspnea  4     VO2 Peak 3.52     Symptoms Yes (comment)     Comments SOB, back/leg spasms 8-10/10     Resting HR 75 bpm     Resting BP 148/74     Resting Oxygen Saturation  94 %     Exercise Oxygen Saturation  during 6 min walk 88 %     Max Ex. HR 96 bpm     Max Ex. BP 164/70     2 Minute Post BP 156/74       Interval HR   1 Minute HR 93     2 Minute HR 92     3 Minute HR 96     4 Minute HR 93     5 Minute HR 94     6 Minute HR 93     2 Minute Post HR 90     Interval Heart Rate? Yes       Interval Oxygen   Interval Oxygen? Yes     Baseline Oxygen Saturation % 94 %     1 Minute Oxygen Saturation % 90 %     1 Minute Liters of Oxygen 0 L  Room Air     2 Minute Oxygen Saturation % 91 %     2 Minute Liters of Oxygen 0 L     3 Minute Oxygen Saturation % 90 %     3 Minute Liters of Oxygen 0 L     4 Minute Oxygen Saturation % 91 %     4 Minute Liters of Oxygen 0 L     5 Minute Oxygen Saturation % 92 %     5 Minute Liters of Oxygen 0 L     6 Minute Oxygen Saturation % 88 %     6 Minute Liters of Oxygen 0 L     2 Minute Post Oxygen Saturation % 93 %     2 Minute Post Liters of Oxygen 0 L           Oxygen Initial Assessment:  Oxygen Initial Assessment - 01/08/20 1343      Home Oxygen   Home Oxygen Device Home Concentrator    Sleep Oxygen Prescription BiPAP   Does not wear since it is uncomfortable and wants a CPAP instead.   Liters per minute 3    Home Exercise Oxygen Prescription None    Home Resting Oxygen Prescription None    Compliance with Home Oxygen Use No      Initial 6 min Walk   Oxygen Used None      Program Oxygen Prescription   Program Oxygen Prescription None      Intervention   Short Term Goals To learn and  exhibit compliance with exercise, home and travel O2 prescription;To learn and understand importance of monitoring SPO2 with pulse oximeter and demonstrate accurate use of the pulse oximeter.;To learn and demonstrate proper pursed lip breathing techniques or other breathing techniques.;To learn and demonstrate proper use of respiratory medications;To learn and understand importance of maintaining oxygen saturations>88%    Long  Term Goals Exhibits compliance with exercise, home and  travel O2 prescription;Maintenance of O2 saturations>88%;Compliance with respiratory medication;Demonstrates proper use of MDI's;Exhibits proper breathing techniques, such as pursed lip breathing or other method taught during program session;Verbalizes importance of monitoring SPO2 with pulse oximeter and return demonstration           Oxygen Re-Evaluation:   Oxygen Discharge (Final Oxygen Re-Evaluation):   Initial Exercise Prescription:  Initial Exercise Prescription - 01/09/20 1500      Date of Initial Exercise RX and Referring Provider   Date 01/09/20    Referring Provider Jose Fray MD      Treadmill   MPH 0.7    Grade 0    Minutes 15    METs 1.5      NuStep   Level 1    SPM 80    Minutes 15    METs 1.5      T5 Nustep   Level 1    SPM 80    Minutes 15    METs 1.5      Biostep-RELP   Level 1    SPM 50    Minutes 15    METs 1      Prescription Details   Frequency (times per week) 3    Duration Progress to 30 minutes of continuous aerobic without signs/symptoms of physical distress      Intensity   THRR 40-80% of Max Heartrate 107-140    Ratings of Perceived Exertion 11-13    Perceived Dyspnea 0-4      Progression   Progression Continue to progress workloads to maintain intensity without signs/symptoms of physical distress.      Resistance Training   Training Prescription Yes    Weight 3 lbs    Reps 10-15           Perform Capillary Blood Glucose checks as  needed.  Exercise Prescription Changes:   Exercise Comments:   Exercise Goals and Review:  Exercise Goals    Row Name 01/09/20 1531             Exercise Goals   Increase Physical Activity Yes       Intervention Provide advice, education, support and counseling about physical activity/exercise needs.;Develop an individualized exercise prescription for aerobic and resistive training based on initial evaluation findings, risk stratification, comorbidities and participant's personal goals.       Expected Outcomes Short Term: Attend rehab on a regular basis to increase amount of physical activity.;Long Term: Add in home exercise to make exercise part of routine and to increase amount of physical activity.;Long Term: Exercising regularly at least 3-5 days a week.       Increase Strength and Stamina Yes       Intervention Provide advice, education, support and counseling about physical activity/exercise needs.;Develop an individualized exercise prescription for aerobic and resistive training based on initial evaluation findings, risk stratification, comorbidities and participant's personal goals.       Expected Outcomes Short Term: Increase workloads from initial exercise prescription for resistance, speed, and METs.;Short Term: Perform resistance training exercises routinely during rehab and add in resistance training at home;Long Term: Improve cardiorespiratory fitness, muscular endurance and strength as measured by increased METs and functional capacity (6MWT)       Able to understand and use rate of perceived exertion (RPE) scale Yes       Intervention Provide education and explanation on how to use RPE scale       Expected Outcomes Short Term: Able to use RPE daily in rehab  to express subjective intensity level;Long Term:  Able to use RPE to guide intensity level when exercising independently       Able to understand and use Dyspnea scale Yes       Intervention Provide education and  explanation on how to use Dyspnea scale       Expected Outcomes Short Term: Able to use Dyspnea scale daily in rehab to express subjective sense of shortness of breath during exertion;Long Term: Able to use Dyspnea scale to guide intensity level when exercising independently       Knowledge and understanding of Target Heart Rate Range (THRR) Yes       Intervention Provide education and explanation of THRR including how the numbers were predicted and where they are located for reference       Expected Outcomes Short Term: Able to state/look up THRR;Short Term: Able to use daily as guideline for intensity in rehab;Long Term: Able to use THRR to govern intensity when exercising independently       Able to check pulse independently Yes       Intervention Provide education and demonstration on how to check pulse in carotid and radial arteries.;Review the importance of being able to check your own pulse for safety during independent exercise       Expected Outcomes Short Term: Able to explain why pulse checking is important during independent exercise;Long Term: Able to check pulse independently and accurately       Understanding of Exercise Prescription Yes       Intervention Provide education, explanation, and written materials on patient's individual exercise prescription       Expected Outcomes Short Term: Able to explain program exercise prescription;Long Term: Able to explain home exercise prescription to exercise independently              Exercise Goals Re-Evaluation :  Exercise Goals Re-Evaluation    Row Name 01/24/20 1512             Exercise Goal Re-Evaluation   Comments Has yet to start exercise in rehab due to admission              Discharge Exercise Prescription (Final Exercise Prescription Changes):   Nutrition:  Target Goals: Understanding of nutrition guidelines, daily intake of sodium 1500mg , cholesterol 200mg , calories 30% from fat and 7% or less from saturated  fats, daily to have 5 or more servings of fruits and vegetables.  Education: All About Nutrition: -Group instruction provided by verbal, written material, interactive activities, discussions, models, and posters to present general guidelines for heart healthy nutrition including fat, fiber, MyPlate, the role of sodium in heart healthy nutrition, utilization of the nutrition label, and utilization of this knowledge for meal planning. Follow up email sent as well. Written material given at graduation.   Biometrics:  Pre Biometrics - 01/09/20 1533      Pre Biometrics   Height 5\' 7"  (1.702 m)    Weight 266 lb 1.6 oz (120.7 kg)    BMI (Calculated) 41.67    Single Leg Stand 0.9 seconds            Nutrition Therapy Plan and Nutrition Goals:   Nutrition Assessments:  Nutrition Assessments - 01/09/20 1533      MEDFICTS Scores   Pre Score 102          MEDIFICTS Score Key:  ?70 Need to make dietary changes   40-70 Heart Healthy Diet  ? 40 Therapeutic Level Cholesterol Diet  Picture Your Plate Scores:  <18 Unhealthy dietary pattern with much room for improvement.  41-50 Dietary pattern unlikely to meet recommendations for good health and room for improvement.  51-60 More healthful dietary pattern, with some room for improvement.   >60 Healthy dietary pattern, although there may be some specific behaviors that could be improved.   Nutrition Goals Re-Evaluation:   Nutrition Goals Discharge (Final Nutrition Goals Re-Evaluation):   Psychosocial: Target Goals: Acknowledge presence or absence of significant depression and/or stress, maximize coping skills, provide positive support system. Participant is able to verbalize types and ability to use techniques and skills needed for reducing stress and depression.   Education: Stress, Anxiety, and Depression - Group verbal and visual presentation to define topics covered.  Reviews how body is impacted by stress, anxiety, and  depression.  Also discusses healthy ways to reduce stress and to treat/manage anxiety and depression.  Written material given at graduation.   Education: Sleep Hygiene -Provides group verbal and written instruction about how sleep can affect your health.  Define sleep hygiene, discuss sleep cycles and impact of sleep habits. Review good sleep hygiene tips.    Cardiac Rehab from 02/16/2018 in [facility] Cardiac and Pulmonary Rehab  Date 10/06/17  Educator Lucianne Lei, MSW  Instruction Review Code 1- Verbalizes Understanding      Initial Review & Psychosocial Screening:  Initial Psych Review & Screening - 01/08/20 1348      Initial Review   Current issues with Current Stress Concerns    Source of Stress Concerns Chronic Illness    Comments His breathing has been tough for him and makes it heard for him to do his daily activities. He wants to be able to move more and breath easier so he can live life better.      Family Dynamics   Good Support System? Yes    Comments He can look to his wife for support. His daughter and son in law just moved into his sub division.      Barriers   Psychosocial barriers to participate in program The patient should benefit from training in stress management and relaxation.      Screening Interventions   Interventions To provide support and resources with identified psychosocial needs;Provide feedback about the scores to participant;Encouraged to exercise    Expected Outcomes Short Term goal: Utilizing psychosocial counselor, staff and physician to assist with identification of specific Stressors or current issues interfering with healing process. Setting desired goal for each stressor or current issue identified.;Long Term Goal: Stressors or current issues are controlled or eliminated.;Short Term goal: Identification and review with participant of any Quality of Life or Depression concerns found by scoring the questionnaire.;Long Term goal: The participant improves  quality of Life and PHQ9 Scores as seen by post scores and/or verbalization of changes           Quality of Life Scores:  Scores of 19 and below usually indicate a poorer quality of life in these areas.  A difference of  2-3 points is a clinically meaningful difference.  A difference of 2-3 points in the total score of the Quality of Life Index has been associated with significant improvement in overall quality of life, self-image, physical symptoms, and general health in studies assessing change in quality of life.  PHQ-9: Recent Review Flowsheet Data    Depression screen [facility] 2/9 01/09/2020 02/03/2018 10/04/2017   Decreased Interest 2 1 1    Down, Depressed, Hopeless 2 1 1    PHQ -  2 Score $Remov'4 2 2   'rEqKwo$ Altered sleeping $RemoveBeforeDE'3 1 2   'sAaRLnrDQTPLDkC$ Tired, decreased energy $RemoveBeforeDE'3 2 3   'vlKCTEvYbNOcJXY$ Change in appetite $RemoveBef'1 2 1   'cRwQkqCXGq$ Feeling bad or failure about yourself  $RemoveB'2 1 1   'hbaRMLIV$ Trouble concentrating 1 0 0   Moving slowly or fidgety/restless 0 0 0   Suicidal thoughts 1  0 0   PHQ-9 Score $RemoveBef'15 8 9   'NlmyJjsakg$ Difficult doing work/chores Somewhat difficult Somewhat difficult Somewhat difficult     Interpretation of Total Score  Total Score Depression Severity:  1-4 = Minimal depression, 5-9 = Mild depression, 10-14 = Moderate depression, 15-19 = Moderately severe depression, 20-27 = Severe depression   Psychosocial Evaluation and Intervention:  Psychosocial Evaluation - 01/08/20 1350      Psychosocial Evaluation & Interventions   Interventions Encouraged to exercise with the program and follow exercise prescription;Relaxation education;Stress management education    Comments His breathing has been tough for him and makes it heard for him to do his daily activities. He wants to be able to move more and breath easier so he can live life better.    Expected Outcomes Short: Exercise regularly to support mental health and notify staff of any changes. Long: maintain mental health and well being through teaching of rehab or prescribed medications  independently.    Continue Psychosocial Services  Follow up required by staff           Psychosocial Re-Evaluation:   Psychosocial Discharge (Final Psychosocial Re-Evaluation):   Education: Education Goals: Education classes will be provided on a weekly basis, covering required topics. Participant will state understanding/return demonstration of topics presented.  Learning Barriers/Preferences:  Learning Barriers/Preferences - 01/08/20 1348      Learning Barriers/Preferences   Learning Barriers None    Learning Preferences None           General Pulmonary Education Topics:  Infection Prevention: - Provides verbal and written material to individual with discussion of infection control including proper hand washing and proper equipment cleaning during exercise session.   Pulmonary Rehab from 01/09/2020 in [facility] Cardiac and Pulmonary Rehab  Date 01/08/20  Educator [facility]  Instruction Review Code 1- Verbalizes Understanding      Falls Prevention: - Provides verbal and written material to individual with discussion of falls prevention and safety.   Pulmonary Rehab from 01/09/2020 in [facility] Cardiac and Pulmonary Rehab  Date 01/08/20  Educator [facility]  Instruction Review Code 1- Verbalizes Understanding      Chronic Lung Disease Review: - Group verbal instruction with posters, models, PowerPoint presentations and videos,  to review new updates, new respiratory medications, new advancements in procedures and treatments. Providing information on websites and "800" numbers for continued self-education. Includes information about supplement oxygen, available portable oxygen systems, continuous and intermittent flow rates, oxygen safety, concentrators, and Medicare reimbursement for oxygen. Explanation of Pulmonary Drugs, including class, frequency, complications, importance of spacers, rinsing mouth after steroid MDI's, and proper cleaning methods for nebulizers. Review of basic lung anatomy  and physiology related to function, structure, and complications of lung disease. Review of risk factors. Discussion about methods for diagnosing sleep apnea and types of masks and machines for OSA. Includes a review of the use of types of environmental controls: home humidity, furnaces, filters, dust mite/pet prevention, HEPA vacuums. Discussion about weather changes, air quality and the benefits of nasal washing. Instruction on Warning signs, infection symptoms, calling MD promptly, preventive modes, and value of vaccinations. Review of effective airway clearance, coughing  and/or vibration techniques. Emphasizing that all should Create an Action Plan. Written material given at graduation.   Pulmonary Rehab from 01/09/2020 in [facility] Cardiac and Pulmonary Rehab  Education need identified 01/09/20      AED/CPR: - Group verbal and written instruction with the use of models to demonstrate the basic use of the AED with the basic ABC's of resuscitation.    Anatomy and Cardiac Procedures: - Group verbal and visual presentation and models provide information about basic cardiac anatomy and function. Reviews the testing methods done to diagnose heart disease and the outcomes of the test results. Describes the treatment choices: Medical Management, Angioplasty, or Coronary Bypass Surgery for treating various heart conditions including Myocardial Infarction, Angina, Valve Disease, and Cardiac Arrhythmias.  Written material given at graduation.   Medication Safety: - Group verbal and visual instruction to review commonly prescribed medications for heart and lung disease. Reviews the medication, class of the drug, and side effects. Includes the steps to properly store meds and maintain the prescription regimen.  Written material given at graduation.   Other: -Provides group and verbal instruction on various topics (see comments)   Knowledge Questionnaire Score:  Knowledge Questionnaire Score - 01/09/20  1534      Knowledge Questionnaire Score   Pre Score 15/18 educaiton focus: exercise, O2 safety, PLB            Core Components/Risk Factors/Patient Goals at Admission:  Personal Goals and Risk Factors at Admission - 01/09/20 1534      Core Components/Risk Factors/Patient Goals on Admission    Weight Management Yes;Obesity;Weight Loss    Intervention Weight Management: Develop a combined nutrition and exercise program designed to reach desired caloric intake, while maintaining appropriate intake of nutrient and fiber, sodium and fats, and appropriate energy expenditure required for the weight goal.;Weight Management: Provide education and appropriate resources to help participant work on and attain dietary goals.;Weight Management/Obesity: Establish reasonable short term and long term weight goals.;Obesity: Provide education and appropriate resources to help participant work on and attain dietary goals.    Admit Weight 266 lb 1.6 oz (120.7 kg)    Goal Weight: Short Term 260 lb (117.9 kg)    Goal Weight: Long Term 250 lb (113.4 kg)    Expected Outcomes Short Term: Continue to assess and modify interventions until short term weight is achieved;Long Term: Adherence to nutrition and physical activity/exercise program aimed toward attainment of established weight goal;Weight Loss: Understanding of general recommendations for a balanced deficit meal plan, which promotes 1-2 lb weight loss per week and includes a negative energy balance of [phone] kcal/d;Understanding recommendations for meals to include 15-35% energy as protein, 25-35% energy from fat, 35-60% energy from carbohydrates, less than $RemoveB'200mg'MXLnHixV$  of dietary cholesterol, 20-35 gm of total fiber daily;Understanding of distribution of calorie intake throughout the day with the consumption of 4-5 meals/snacks    Tobacco Cessation Yes    Number of packs per day 1/2    Intervention Assist the participant in steps to quit. Provide individualized  education and counseling about committing to Tobacco Cessation, relapse prevention, and pharmacological support that can be provided by physician.;Advice worker, assist with locating and accessing local/national Quit Smoking programs, and support quit date choice.    Expected Outcomes Short Term: Will demonstrate readiness to quit, by selecting a quit date.;Short Term: Will quit all tobacco product use, adhering to prevention of relapse plan.;Long Term: Complete abstinence from all tobacco products for at least 12 months from quit date.  Improve shortness of breath with ADL's Yes    Intervention Provide education, individualized exercise plan and daily activity instruction to help decrease symptoms of SOB with activities of daily living.    Expected Outcomes Short Term: Improve cardiorespiratory fitness to achieve a reduction of symptoms when performing ADLs;Long Term: Be able to perform more ADLs without symptoms or delay the onset of symptoms    Diabetes Yes    Intervention Provide education about signs/symptoms and action to take for hypo/hyperglycemia.;Provide education about proper nutrition, including hydration, and aerobic/resistive exercise prescription along with prescribed medications to achieve blood glucose in normal ranges: Fasting glucose 65-99 mg/dL    Expected Outcomes Short Term: Participant verbalizes understanding of the signs/symptoms and immediate care of hyper/hypoglycemia, proper foot care and importance of medication, aerobic/resistive exercise and nutrition plan for blood glucose control.;Long Term: Attainment of HbA1C < 7%.    Heart Failure Yes    Intervention Provide a combined exercise and nutrition program that is supplemented with education, support and counseling about heart failure. Directed toward relieving symptoms such as shortness of breath, decreased exercise tolerance, and extremity edema.    Expected Outcomes Improve functional capacity of life;Short  term: Attendance in program 2-3 days a week with increased exercise capacity. Reported lower sodium intake. Reported increased fruit and vegetable intake. Reports medication compliance.;Short term: Daily weights obtained and reported for increase. Utilizing diuretic protocols set by physician.;Long term: Adoption of self-care skills and reduction of barriers for early signs and symptoms recognition and intervention leading to self-care maintenance.    Hypertension Yes    Intervention Provide education on lifestyle modifcations including regular physical activity/exercise, weight management, moderate sodium restriction and increased consumption of fresh fruit, vegetables, and low fat dairy, alcohol moderation, and smoking cessation.;Monitor prescription use compliance.    Expected Outcomes Short Term: Continued assessment and intervention until BP is < 140/43mm HG in hypertensive participants. < 130/54mm HG in hypertensive participants with diabetes, heart failure or chronic kidney disease.;Long Term: Maintenance of blood pressure at goal levels.    Lipids Yes    Intervention Provide education and support for participant on nutrition & aerobic/resistive exercise along with prescribed medications to achieve LDL '70mg'$ , HDL >$Remo'40mg'JdFao$ .    Expected Outcomes Short Term: Participant states understanding of desired cholesterol values and is compliant with medications prescribed. Participant is following exercise prescription and nutrition guidelines.;Long Term: Cholesterol controlled with medications as prescribed, with individualized exercise RX and with personalized nutrition plan. Value goals: LDL < $Rem'70mg'FPPx$ , HDL > 40 mg.           Education:Diabetes - Individual verbal and written instruction to review signs/symptoms of diabetes, desired ranges of glucose level fasting, after meals and with exercise. Acknowledge that pre and post exercise glucose checks will be done for 3 sessions at entry of program.   Pulmonary  Rehab from 01/09/2020 in [facility] Cardiac and Pulmonary Rehab  Date 01/08/20  Educator [facility]  Instruction Review Code 1- Verbalizes Understanding      Know Your Numbers and Heart Failure: - Group verbal and visual instruction to discuss disease risk factors for cardiac and pulmonary disease and treatment options.  Reviews associated critical values for Overweight/Obesity, Hypertension, Cholesterol, and Diabetes.  Discusses basics of heart failure: signs/symptoms and treatments.  Introduces Heart Failure Zone chart for action plan for heart failure.  Written material given at graduation.   Core Components/Risk Factors/Patient Goals Review:   Goals and Risk Factor Review    Row Name 01/09/20 [phone]  Core Components/Risk Factors/Patient Goals Review   Personal Goals Review Tobacco Cessation       Review Gil is a current tobacco user. Intervention for tobacco cessation was provided at the initial medical review. He was asked about readiness to quit and reported that he is ready to quit again . Patient was advised and educated about tobacco cessation using combination therapy, tobacco cessation classes, quit line, and quit smoking apps. He already has patches and is ready to start to use them.  He declined handouts. Patient demonstrated understanding of this material. Staff will continue to provide encouragement and follow up with the patient throughout the program.       Expected Outcomes Short: Start using patches Long: Set quit date              Core Components/Risk Factors/Patient Goals at Discharge (Final Review):   Goals and Risk Factor Review - 01/09/20 1532      Core Components/Risk Factors/Patient Goals Review   Personal Goals Review Tobacco Cessation    Review Arnoldo is a current tobacco user. Intervention for tobacco cessation was provided at the initial medical review. He was asked about readiness to quit and reported that he is ready to quit again . Patient was advised  and educated about tobacco cessation using combination therapy, tobacco cessation classes, quit line, and quit smoking apps. He already has patches and is ready to start to use them.  He declined handouts. Patient demonstrated understanding of this material. Staff will continue to provide encouragement and follow up with the patient throughout the program.    Expected Outcomes Short: Start using patches Long: Set quit date           ITP Comments:  ITP Comments    Row Name 01/08/20 1419 01/09/20 1527 01/17/20 0650 01/17/20 0903 01/24/20 1510   ITP Comments Virtual Visit completed. Patient informed on EP and RD appointment and 6 Minute walk test. Patient also informed of patient health questionnaires on My Chart. Patient Verbalizes understanding. Visit diagnosis can be found in [facility] 10/24/[facility]. Completed 6MWT and gym orientation. Initial ITP created and sent for review to Dr. Emily Filbert, Medical Director. 30 Day review completed. Medical Director ITP review done, changes made as directed, and signed approval by Medical Director. Pt called out today.  Still not feeling well.  He was encouraged to contact his PCP to advise on meds and was encouraged to get tested.  He will be out for the week. Pt did present to ED and was diagnosised and admitted with pnuemonia.  He has a follow up appt on 01/25/20 from his discharge.   Row Name 02/01/20 [phone] 02/14/20 0955         ITP Comments Currently admitted at [facility] after sycopal episode in office. 30 Day review completed. Medical Director ITP review done, changes made as directed, and signed approval by Medical Director. Remains out for medical reasons             Comments:

## 2020-02-15 ENCOUNTER — Ambulatory Visit: Payer: BC Managed Care – PPO

## 2020-02-19 ENCOUNTER — Ambulatory Visit: Payer: BC Managed Care – PPO

## 2020-02-21 ENCOUNTER — Ambulatory Visit: Payer: BC Managed Care – PPO

## 2020-02-21 ENCOUNTER — Encounter: Payer: Self-pay | Admitting: *Deleted

## 2020-02-21 ENCOUNTER — Telehealth: Payer: Self-pay | Admitting: *Deleted

## 2020-02-21 DIAGNOSIS — J449 Chronic obstructive pulmonary disease, unspecified: Secondary | ICD-10-CM

## 2020-02-21 NOTE — Telephone Encounter (Signed)
Called to check up on patient.  Out since recent admission last month.

## 2020-02-22 ENCOUNTER — Ambulatory Visit: Payer: BC Managed Care – PPO

## 2020-02-26 ENCOUNTER — Ambulatory Visit: Payer: BC Managed Care – PPO

## 2020-02-28 ENCOUNTER — Ambulatory Visit: Payer: BC Managed Care – PPO

## 2020-02-28 ENCOUNTER — Telehealth: Payer: Self-pay

## 2020-02-28 NOTE — Telephone Encounter (Signed)
Called Jose [name] to check in as he has not been to rehab since his orientation and the last time we heard from him was 01/17/20. Sent discharge letter, if we do not hear back from him in two weeks we will have to discharge him at this time.

## 2020-02-29 ENCOUNTER — Ambulatory Visit: Payer: BC Managed Care – PPO

## 2020-03-04 ENCOUNTER — Ambulatory Visit: Payer: BC Managed Care – PPO

## 2020-03-06 ENCOUNTER — Ambulatory Visit: Payer: BC Managed Care – PPO

## 2020-03-07 ENCOUNTER — Ambulatory Visit: Payer: BC Managed Care – PPO

## 2020-03-11 ENCOUNTER — Ambulatory Visit: Payer: BC Managed Care – PPO

## 2020-03-13 ENCOUNTER — Encounter: Payer: Self-pay | Admitting: *Deleted

## 2020-03-13 ENCOUNTER — Ambulatory Visit: Payer: BC Managed Care – PPO

## 2020-03-13 DIAGNOSIS — J449 Chronic obstructive pulmonary disease, unspecified: Secondary | ICD-10-CM

## 2020-03-13 NOTE — Progress Notes (Signed)
Pulmonary Individual Treatment Plan  Patient Details  Name: Jose [name] MRN: [id number] Date of Birth: [birth year] Referring Provider:   Flowsheet Row Pulmonary Rehab from 01/09/2020 in [facility] Cardiac and Pulmonary Rehab  Referring Provider Hulan Fray MD      Initial Encounter Date:  Flowsheet Row Pulmonary Rehab from 01/09/2020 in [facility] Cardiac and Pulmonary Rehab  Date 01/09/20      Visit Diagnosis: Chronic obstructive pulmonary disease, unspecified COPD type ([place])  Patient's Home Medications on Admission:  Current Outpatient Medications:  .  albuterol (PROVENTIL) (5 MG/ML) 0.5% nebulizer solution, Take 0.5 mLs (2.5 mg total) by nebulization every 6 (six) hours as needed for wheezing or shortness of breath., Disp: 20 mL, Rfl: 12 .  albuterol (VENTOLIN HFA) 108 (90 Base) MCG/ACT inhaler, Inhale 1 puff into the lungs every 6 (six) hours as needed for wheezing or shortness of breath., Disp: , Rfl:  .  aspirin EC 81 MG EC tablet, Take 1 tablet (81 mg total) by mouth daily. Swallow whole., Disp: 30 tablet, Rfl: 11 .  Blood Glucose Monitoring Suppl (CONTOUR NEXT MONITOR) w/Device KIT, by Does not apply route., Disp: , Rfl:  .  Blood Glucose Monitoring Suppl (GLUCOCOM BLOOD GLUCOSE MONITOR) DEVI, 1 each by XX route as directed (For Blood Glucose Monitoring), Disp: , Rfl:  .  DULoxetine (CYMBALTA) 60 MG capsule, Take 60 mg by mouth once a week. , Disp: , Rfl:  .  enoxaparin (LOVENOX) 30 MG/0.3ML injection, Inject 1.1 mLs (110 mg total) into the skin 2 (two) times daily for 2 days., Disp: 4.4 mL, Rfl: 0 .  Fluticasone Propionate, Inhal, (FLOVENT DISKUS) 100 MCG/BLIST AEPB, Inhale 1 puff into the lungs daily as needed (wheezing & shortness of breath)., Disp: , Rfl:  .  gabapentin (NEURONTIN) 600 MG tablet, Take 600 mg by mouth 3 (three) times daily., Disp: , Rfl:  .  glucose blood (CONTOUR NEXT TEST) test strip, Use to check blood sugar twice 2 time(s) daily., Disp: , Rfl:  .   HYDROcodone-acetaminophen (NORCO) 10-325 MG tablet, Take 1 tablet by mouth 2 (two) times daily. , Disp: , Rfl:  .  ipratropium-albuterol (DUONEB) 0.5-2.5 (3) MG/3ML SOLN, Inhale 3 mLs into the lungs at bedtime. , Disp: , Rfl:  .  Lancets (ACCU-CHEK SOFT TOUCH) lancets, 2 (two) times daily., Disp: , Rfl:  .  metoprolol tartrate (LOPRESSOR) 12.5 mg TABS tablet, Take 12.5 mg by mouth 2 (two) times daily.  (Patient not taking: Reported on 01/18/2020), Disp: , Rfl:  .  nicotine (NICODERM CQ - DOSED IN MG/24 HOURS) 14 mg/24hr patch, Apply one patch daily x 2 weeks.  Begin after completion of 21 mg patches, Disp: , Rfl:  .  nitroGLYCERIN (NITROSTAT) 0.4 MG SL tablet, Place 0.4 mg under the tongue every 5 (five) minutes as needed for chest pain. , Disp: , Rfl: 1 .  potassium chloride (KLOR-CON) 20 MEQ packet, Take 40 mEq by mouth 2 (two) times daily., Disp: 2 packet, Rfl: 0 .  PROAIR HFA 108 (90 Base) MCG/ACT inhaler, Inhale 1-2 puffs into the lungs every 4 (four) hours as needed for wheezing. , Disp: , Rfl: 12 .  rosuvastatin (CRESTOR) 10 MG tablet, Take 10 mg by mouth every evening., Disp: , Rfl: 11 .  tamsulosin (FLOMAX) 0.4 MG CAPS capsule, Take 0.4 mg by mouth daily., Disp: , Rfl: 0 .  Tiotropium Bromide-Olodaterol (STIOLTO RESPIMAT) 2.5-2.5 MCG/ACT AERS, Inhale 2 puffs into the lungs daily. , Disp: , Rfl:  .  tiZANidine (  ZANAFLEX) 4 MG tablet, Take 8 mg by mouth in the morning and at bedtime. , Disp: , Rfl:  .  topiramate (TOPAMAX) 50 MG tablet, Take 50 mg by mouth 2 (two) times daily., Disp: , Rfl: 0 .  warfarin (COUMADIN) 6 MG tablet, Take 1 tablet (6 mg total) by mouth daily for 2 days., Disp: 2 tablet, Rfl: 0 .  warfarin (COUMADIN) 7.5 MG tablet, Take 1 tablet (7.5 mg total) by mouth at bedtime for 1 dose., Disp: 1 tablet, Rfl: 0  Past Medical History: Past Medical History:  Diagnosis Date  . Antiphospholipid antibody syndrome (HCC)   . CAD S/P percutaneous coronary angioplasty '98, '04, Feb  2017   [facility]  . Carotid arterial disease (HCC) 10/2015   moderate bilateral  . COPD (chronic obstructive pulmonary disease) (HCC)   . Coronary artery disease   . Hypertension   . Non-insulin treated type 2 diabetes mellitus (HCC)   . Pulmonary emboli (HCC) 2011  . RBBB   . Sleep apnea     Tobacco Use: Social History   Tobacco Use  Smoking Status Current Every Day Smoker  . Packs/day: 0.50  . Years: 40.00  . Pack years: 20.00  . Types: Cigarettes  . Last attempt to quit: 01/15/2019  . Years since quitting: 1.1  Smokeless Tobacco Never Used  Tobacco Comment   has started smoking again, willing to quit.    Labs: Recent Review Flowsheet Data    Labs for ITP Cardiac and Pulmonary Rehab Latest Ref Rng & Units 08/16/2016 12/27/2018 03/15/2019 01/19/2020 01/20/2020   Cholestrol 0 - 200 mg/dL 653 - - - -   LDLCALC 0 - 99 mg/dL 64 - - - -   HDL >28 mg/dL 32(Z) - - - -   Trlycerides <150 mg/dL 999(K) - - - -   Hemoglobin A1c 4.8 - 5.6 % 5.9(H) - 6.9(H) 8.7(H) -   PHART 7.350 - 7.450 - - - - 7.302(L)   PCO2ART 32.0 - 48.0 mmHg - - - - 72.9(HH)   HCO3 20.0 - 28.0 mmol/L - 31.8(H) - - 35.0(H)   TCO2 22 - 32 mmol/L - 34(H) - - -   O2SAT % - 95.0 - - 94.9       Pulmonary Assessment Scores:  Pulmonary Assessment Scores    Row Name 01/09/20 1535         ADL UCSD   ADL Phase Entry     SOB Score total 114     Rest 1     Walk 4     Stairs 5     Bath 5     Dress 5     Shop 5           CAT Score   CAT Score 28           mMRC Score   mMRC Score 4            UCSD: Self-administered rating of dyspnea associated with activities of daily living (ADLs) 6-point scale (0 = "not at all" to 5 = "maximal or unable to do because of breathlessness")  Scoring Scores range from 0 to 120.  Minimally important difference is 5 units  CAT: CAT can identify the health impairment of COPD patients and is better correlated with disease progression.  CAT has a scoring range of zero to 40.  The CAT score is classified into four groups of low (less than 10), medium (10 - 20), high (21-30) and  very high (31-40) based on the impact level of disease on health status. A CAT score over 10 suggests significant symptoms.  A worsening CAT score could be explained by an exacerbation, poor medication adherence, poor inhaler technique, or progression of COPD or comorbid conditions.  CAT MCID is 2 points  mMRC: mMRC (Modified Medical Research Council) Dyspnea Scale is used to assess the degree of baseline functional disability in patients of respiratory disease due to dyspnea. No minimal important difference is established. A decrease in score of 1 point or greater is considered a positive change.   Pulmonary Function Assessment:  Pulmonary Function Assessment - 01/08/20 1347      Breath   Shortness of Breath Limiting activity;Yes           Exercise Target Goals: Exercise Program Goal: Individual exercise prescription set using results from initial 6 min walk test and THRR while considering  patient's activity barriers and safety.   Exercise Prescription Goal: Initial exercise prescription builds to 30-45 minutes a day of aerobic activity, 2-3 days per week.  Home exercise guidelines will be given to patient during program as part of exercise prescription that the participant will acknowledge.  Education: Aerobic Exercise: - Group verbal and visual presentation on the components of exercise prescription. Introduces F.I.T.T principle from ACSM for exercise prescriptions.  Reviews F.I.T.T. principles of aerobic exercise including progression. Written material given at graduation. Flowsheet Row Cardiac Rehab from 02/16/2018 in [facility] Cardiac and Pulmonary Rehab  Date 10/18/17  Educator [facility]  Instruction Review Code 1- [employer] Understanding      Education: Resistance Exercise: - Group verbal and visual presentation on the components of exercise prescription. Introduces F.I.T.T principle  from ACSM for exercise prescriptions  Reviews F.I.T.T. principles of resistance exercise including progression. Written material given at graduation.    Education: Exercise & Equipment Safety: - Individual verbal instruction and demonstration of equipment use and safety with use of the equipment. Flowsheet Row Pulmonary Rehab from 01/09/2020 in [facility] Cardiac and Pulmonary Rehab  Date 01/08/20  Educator [facility]  Instruction Review Code 1- Verbalizes Understanding      Education: Exercise Physiology & General Exercise Guidelines: - Group verbal and written instruction with models to review the exercise physiology of the cardiovascular system and associated critical values. Provides general exercise guidelines with specific guidelines to those with heart or lung disease.  Flowsheet Row Pulmonary Rehab from 01/09/2020 in [facility] Cardiac and Pulmonary Rehab  Education need identified 01/09/20      Education: Flexibility, Balance, Mind/Body Relaxation: - Group verbal and visual presentation with interactive activity on the components of exercise prescription. Introduces F.I.T.T principle from ACSM for exercise prescriptions. Reviews F.I.T.T. principles of flexibility and balance exercise training including progression. Also discusses the mind body connection.  Reviews various relaxation techniques to help reduce and manage stress (i.e. Deep breathing, progressive muscle relaxation, and visualization). Balance handout provided to take home. Written material given at graduation. Flowsheet Row Cardiac Rehab from 02/16/2018 in [facility] Cardiac and Pulmonary Rehab  Date 02/07/18  Educator [facility]  Instruction Review Code 1- Verbalizes Understanding      Activity Barriers & Risk Stratification:  Activity Barriers & Cardiac Risk Stratification - 01/09/20 1529      Activity Barriers & Cardiac Risk Stratification   Activity Barriers Arthritis;Back Problems;Neck/Spine Problems;Muscular Weakness;Deconditioning;Balance  Concerns;Shortness of Breath;Decreased Ventricular Function           6 Minute Walk:  6 Minute Walk    Row Name 01/09/20 1527  6 Minute Walk   Phase Initial     Distance 385 feet     Walk Time 5.02 minutes     # of Rest Breaks 3  9 sec, 12 sec, 8 sec, stopped at 5:30     MPH 0.87     METS 1     RPE 19     Perceived Dyspnea  4     VO2 Peak 3.52     Symptoms Yes (comment)     Comments SOB, back/leg spasms 8-10/10     Resting HR 75 bpm     Resting BP 148/74     Resting Oxygen Saturation  94 %     Exercise Oxygen Saturation  during 6 min walk 88 %     Max Ex. HR 96 bpm     Max Ex. BP 164/70     2 Minute Post BP 156/74           Interval HR   1 Minute HR 93     2 Minute HR 92     3 Minute HR 96     4 Minute HR 93     5 Minute HR 94     6 Minute HR 93     2 Minute Post HR 90     Interval Heart Rate? Yes           Interval Oxygen   Interval Oxygen? Yes     Baseline Oxygen Saturation % 94 %     1 Minute Oxygen Saturation % 90 %     1 Minute Liters of Oxygen 0 L  Room Air     2 Minute Oxygen Saturation % 91 %     2 Minute Liters of Oxygen 0 L     3 Minute Oxygen Saturation % 90 %     3 Minute Liters of Oxygen 0 L     4 Minute Oxygen Saturation % 91 %     4 Minute Liters of Oxygen 0 L     5 Minute Oxygen Saturation % 92 %     5 Minute Liters of Oxygen 0 L     6 Minute Oxygen Saturation % 88 %     6 Minute Liters of Oxygen 0 L     2 Minute Post Oxygen Saturation % 93 %     2 Minute Post Liters of Oxygen 0 L           Oxygen Initial Assessment:  Oxygen Initial Assessment - 01/08/20 1343      Home Oxygen   Home Oxygen Device Home Concentrator    Sleep Oxygen Prescription BiPAP   Does not wear since it is uncomfortable and wants a CPAP instead.   Liters per minute 3    Home Exercise Oxygen Prescription None    Home Resting Oxygen Prescription None    Compliance with Home Oxygen Use No      Initial 6 min Walk   Oxygen Used None      Program  Oxygen Prescription   Program Oxygen Prescription None      Intervention   Short Term Goals To learn and exhibit compliance with exercise, home and travel O2 prescription;To learn and understand importance of monitoring SPO2 with pulse oximeter and demonstrate accurate use of the pulse oximeter.;To learn and demonstrate proper pursed lip breathing techniques or other breathing techniques.;To learn and demonstrate proper use of respiratory medications;To learn and understand importance of maintaining oxygen saturations>88%  Long  Term Goals Exhibits compliance with exercise, home and travel O2 prescription;Maintenance of O2 saturations>88%;Compliance with respiratory medication;Demonstrates proper use of MDI's;Exhibits proper breathing techniques, such as pursed lip breathing or other method taught during program session;Verbalizes importance of monitoring SPO2 with pulse oximeter and return demonstration           Oxygen Re-Evaluation:   Oxygen Discharge (Final Oxygen Re-Evaluation):   Initial Exercise Prescription:  Initial Exercise Prescription - 01/09/20 1500      Date of Initial Exercise RX and Referring Provider   Date 01/09/20    Referring Provider Hulan Fray MD      Treadmill   MPH 0.7    Grade 0    Minutes 15    METs 1.5      NuStep   Level 1    SPM 80    Minutes 15    METs 1.5      T5 Nustep   Level 1    SPM 80    Minutes 15    METs 1.5      Biostep-RELP   Level 1    SPM 50    Minutes 15    METs 1      Prescription Details   Frequency (times per week) 3    Duration Progress to 30 minutes of continuous aerobic without signs/symptoms of physical distress      Intensity   THRR 40-80% of Max Heartrate 107-140    Ratings of Perceived Exertion 11-13    Perceived Dyspnea 0-4      Progression   Progression Continue to progress workloads to maintain intensity without signs/symptoms of physical distress.      Resistance Training   Training  Prescription Yes    Weight 3 lbs    Reps 10-15           Perform Capillary Blood Glucose checks as needed.  Exercise Prescription Changes:   Exercise Comments:   Exercise Goals and Review:  Exercise Goals    Row Name 01/09/20 1531             Exercise Goals   Increase Physical Activity Yes       Intervention Provide advice, education, support and counseling about physical activity/exercise needs.;Develop an individualized exercise prescription for aerobic and resistive training based on initial evaluation findings, risk stratification, comorbidities and participant's personal goals.       Expected Outcomes Short Term: Attend rehab on a regular basis to increase amount of physical activity.;Long Term: Add in home exercise to make exercise part of routine and to increase amount of physical activity.;Long Term: Exercising regularly at least 3-5 days a week.       Increase Strength and Stamina Yes       Intervention Provide advice, education, support and counseling about physical activity/exercise needs.;Develop an individualized exercise prescription for aerobic and resistive training based on initial evaluation findings, risk stratification, comorbidities and participant's personal goals.       Expected Outcomes Short Term: Increase workloads from initial exercise prescription for resistance, speed, and METs.;Short Term: Perform resistance training exercises routinely during rehab and add in resistance training at home;Long Term: Improve cardiorespiratory fitness, muscular endurance and strength as measured by increased METs and functional capacity (6MWT)       Able to understand and use rate of perceived exertion (RPE) scale Yes       Intervention Provide education and explanation on how to use RPE scale       Expected  Outcomes Short Term: Able to use RPE daily in rehab to express subjective intensity level;Long Term:  Able to use RPE to guide intensity level when exercising  independently       Able to understand and use Dyspnea scale Yes       Intervention Provide education and explanation on how to use Dyspnea scale       Expected Outcomes Short Term: Able to use Dyspnea scale daily in rehab to express subjective sense of shortness of breath during exertion;Long Term: Able to use Dyspnea scale to guide intensity level when exercising independently       Knowledge and understanding of Target Heart Rate Range (THRR) Yes       Intervention Provide education and explanation of THRR including how the numbers were predicted and where they are located for reference       Expected Outcomes Short Term: Able to state/look up THRR;Short Term: Able to use daily as guideline for intensity in rehab;Long Term: Able to use THRR to govern intensity when exercising independently       Able to check pulse independently Yes       Intervention Provide education and demonstration on how to check pulse in carotid and radial arteries.;Review the importance of being able to check your own pulse for safety during independent exercise       Expected Outcomes Short Term: Able to explain why pulse checking is important during independent exercise;Long Term: Able to check pulse independently and accurately       Understanding of Exercise Prescription Yes       Intervention Provide education, explanation, and written materials on patient's individual exercise prescription       Expected Outcomes Short Term: Able to explain program exercise prescription;Long Term: Able to explain home exercise prescription to exercise independently              Exercise Goals Re-Evaluation :  Exercise Goals Re-Evaluation    Row Name 01/24/20 1512             Exercise Goal Re-Evaluation   Comments Has yet to start exercise in rehab due to admission              Discharge Exercise Prescription (Final Exercise Prescription Changes):   Nutrition:  Target Goals: Understanding of nutrition guidelines,  daily intake of sodium '1500mg'$ , cholesterol '200mg'$ , calories 30% from fat and 7% or less from saturated fats, daily to have 5 or more servings of fruits and vegetables.  Education: All About Nutrition: -Group instruction provided by verbal, written material, interactive activities, discussions, models, and posters to present general guidelines for heart healthy nutrition including fat, fiber, MyPlate, the role of sodium in heart healthy nutrition, utilization of the nutrition label, and utilization of this knowledge for meal planning. Follow up email sent as well. Written material given at graduation.   Biometrics:  Pre Biometrics - 01/09/20 1533      Pre Biometrics   Height $Remov'5\' 7"'CeRfTL$  (1.702 m)    Weight 266 lb 1.6 oz (120.7 kg)    BMI (Calculated) 41.67    Single Leg Stand 0.9 seconds            Nutrition Therapy Plan and Nutrition Goals:   Nutrition Assessments:  Nutrition Assessments - 01/09/20 1533      MEDFICTS Scores   Pre Score 102          MEDIFICTS Score Key:  ?70 Need to make dietary changes   40-70 Heart Healthy  Diet  ? 40 Therapeutic Level Cholesterol Diet   Picture Your Plate Scores:  <34 Unhealthy dietary pattern with much room for improvement.  41-50 Dietary pattern unlikely to meet recommendations for good health and room for improvement.  51-60 More healthful dietary pattern, with some room for improvement.   >60 Healthy dietary pattern, although there may be some specific behaviors that could be improved.   Nutrition Goals Re-Evaluation:   Nutrition Goals Discharge (Final Nutrition Goals Re-Evaluation):   Psychosocial: Target Goals: Acknowledge presence or absence of significant depression and/or stress, maximize coping skills, provide positive support system. Participant is able to verbalize types and ability to use techniques and skills needed for reducing stress and depression.   Education: Stress, Anxiety, and Depression - Group verbal  and visual presentation to define topics covered.  Reviews how body is impacted by stress, anxiety, and depression.  Also discusses healthy ways to reduce stress and to treat/manage anxiety and depression.  Written material given at graduation.   Education: Sleep Hygiene -Provides group verbal and written instruction about how sleep can affect your health.  Define sleep hygiene, discuss sleep cycles and impact of sleep habits. Review good sleep hygiene tips.  Flowsheet Row Cardiac Rehab from 02/16/2018 in [facility] Cardiac and Pulmonary Rehab  Date 10/06/17  Educator Lucianne Lei, MSW  Instruction Review Code 1- Verbalizes Understanding      Initial Review & Psychosocial Screening:  Initial Psych Review & Screening - 01/08/20 1348      Initial Review   Current issues with Current Stress Concerns    Source of Stress Concerns Chronic Illness    Comments His breathing has been tough for him and makes it heard for him to do his daily activities. He wants to be able to move more and breath easier so he can live life better.      Family Dynamics   Good Support System? Yes    Comments He can look to his wife for support. His daughter and son in law just moved into his sub division.      Barriers   Psychosocial barriers to participate in program The patient should benefit from training in stress management and relaxation.      Screening Interventions   Interventions To provide support and resources with identified psychosocial needs;Provide feedback about the scores to participant;Encouraged to exercise    Expected Outcomes Short Term goal: Utilizing psychosocial counselor, staff and physician to assist with identification of specific Stressors or current issues interfering with healing process. Setting desired goal for each stressor or current issue identified.;Long Term Goal: Stressors or current issues are controlled or eliminated.;Short Term goal: Identification and review with participant of any  Quality of Life or Depression concerns found by scoring the questionnaire.;Long Term goal: The participant improves quality of Life and PHQ9 Scores as seen by post scores and/or verbalization of changes           Quality of Life Scores:  Scores of 19 and below usually indicate a poorer quality of life in these areas.  A difference of  2-3 points is a clinically meaningful difference.  A difference of 2-3 points in the total score of the Quality of Life Index has been associated with significant improvement in overall quality of life, self-image, physical symptoms, and general health in studies assessing change in quality of life.  PHQ-9: Recent Review Flowsheet Data    Depression screen [facility] 2/9 01/09/2020 02/03/2018 10/04/2017   Decreased Interest 2 1 1  Down, Depressed, Hopeless _0 PHQ - 2 Score _1 Altered sleeping _2 Tired, decreased energy _3 Change in appetite _4 Feeling bad or failure about yourself  _5 Trouble concentrating 1 0 0   Moving slowly or fidgety/restless 0 0 0   Suicidal thoughts 1  0 0   PHQ-9 Score _6 Difficult doing work/chores Somewhat difficult Somewhat difficult Somewhat difficult     Interpretation of Total Score  Total Score Depression Severity:  1-4 = Minimal depression, 5-9 = Mild depression, 10-14 = Moderate depression, 15-19 = Moderately severe depression, 20-27 = Severe depression   Psychosocial Evaluation and Intervention:  Psychosocial Evaluation - 01/08/20 1350      Psychosocial Evaluation & Interventions   Interventions Encouraged to exercise with the program and follow exercise prescription;Relaxation education;Stress management education    Comments His breathing has been tough for him and makes it heard for him to do his daily activities. He wants to be able to move more and breath easier so he can live life better.    Expected Outcomes Short: Exercise regularly to support mental health and notify staff of  any changes. Long: maintain mental health and well being through teaching of rehab or prescribed medications independently.    Continue Psychosocial Services  Follow up required by staff           Psychosocial Re-Evaluation:   Psychosocial Discharge (Final Psychosocial Re-Evaluation):   Education: Education Goals: Education classes will be provided on a weekly basis, covering required topics. Participant will state understanding/return demonstration of topics presented.  Learning Barriers/Preferences:  Learning Barriers/Preferences - 01/08/20 1348      Learning Barriers/Preferences   Learning Barriers None    Learning Preferences None           General Pulmonary Education Topics:  Infection Prevention: - Provides verbal and written material to individual with discussion of infection control including proper hand washing and proper equipment cleaning during exercise session. Flowsheet Row Pulmonary Rehab from 01/09/2020 in [facility] Cardiac and Pulmonary Rehab  Date 01/08/20  Educator [facility]  Instruction Review Code 1- Verbalizes Understanding      Falls Prevention: - Provides verbal and written material to individual with discussion of falls prevention and safety. Flowsheet Row Pulmonary Rehab from 01/09/2020 in [facility] Cardiac and Pulmonary Rehab  Date 01/08/20  Educator [facility]  Instruction Review Code 1- Verbalizes Understanding      Chronic Lung Disease Review: - Group verbal instruction with posters, models, PowerPoint presentations and videos,  to review new updates, new respiratory medications, new advancements in procedures and treatments. Providing information on websites and "800" numbers for continued self-education. Includes information about supplement oxygen, available portable oxygen systems, continuous and intermittent flow rates, oxygen safety, concentrators, and Medicare reimbursement for oxygen. Explanation of Pulmonary Drugs, including class, frequency,  complications, importance of spacers, rinsing mouth after steroid MDI's, and proper cleaning methods for nebulizers. Review of basic lung anatomy and physiology related to function, structure, and complications of lung disease. Review of risk factors. Discussion about methods for diagnosing sleep apnea and types of masks and machines for OSA. Includes a review of the use of types of environmental controls: home humidity, furnaces, filters, dust mite/pet prevention, HEPA vacuums. Discussion about weather changes, air quality and the benefits of nasal washing. Instruction on Warning signs, infection symptoms, calling MD promptly, preventive modes,  and value of vaccinations. Review of effective airway clearance, coughing and/or vibration techniques. Emphasizing that all should Create an Action Plan. Written material given at graduation. Flowsheet Row Pulmonary Rehab from 01/09/2020 in [facility] Cardiac and Pulmonary Rehab  Education need identified 01/09/20      AED/CPR: - Group verbal and written instruction with the use of models to demonstrate the basic use of the AED with the basic ABC's of resuscitation.    Anatomy and Cardiac Procedures: - Group verbal and visual presentation and models provide information about basic cardiac anatomy and function. Reviews the testing methods done to diagnose heart disease and the outcomes of the test results. Describes the treatment choices: Medical Management, Angioplasty, or Coronary Bypass Surgery for treating various heart conditions including Myocardial Infarction, Angina, Valve Disease, and Cardiac Arrhythmias.  Written material given at graduation.   Medication Safety: - Group verbal and visual instruction to review commonly prescribed medications for heart and lung disease. Reviews the medication, class of the drug, and side effects. Includes the steps to properly store meds and maintain the prescription regimen.  Written material given at  graduation.   Other: -Provides group and verbal instruction on various topics (see comments)   Knowledge Questionnaire Score:  Knowledge Questionnaire Score - 01/09/20 1534      Knowledge Questionnaire Score   Pre Score 15/18 educaiton focus: exercise, O2 safety, PLB            Core Components/Risk Factors/Patient Goals at Admission:  Personal Goals and Risk Factors at Admission - 01/09/20 1534      Core Components/Risk Factors/Patient Goals on Admission    Weight Management Yes;Obesity;Weight Loss    Intervention Weight Management: Develop a combined nutrition and exercise program designed to reach desired caloric intake, while maintaining appropriate intake of nutrient and fiber, sodium and fats, and appropriate energy expenditure required for the weight goal.;Weight Management: Provide education and appropriate resources to help participant work on and attain dietary goals.;Weight Management/Obesity: Establish reasonable short term and long term weight goals.;Obesity: Provide education and appropriate resources to help participant work on and attain dietary goals.    Admit Weight 266 lb 1.6 oz (120.7 kg)    Goal Weight: Short Term 260 lb (117.9 kg)    Goal Weight: Long Term 250 lb (113.4 kg)    Expected Outcomes Short Term: Continue to assess and modify interventions until short term weight is achieved;Long Term: Adherence to nutrition and physical activity/exercise program aimed toward attainment of established weight goal;Weight Loss: Understanding of general recommendations for a balanced deficit meal plan, which promotes 1-2 lb weight loss per week and includes a negative energy balance of [phone] kcal/d;Understanding recommendations for meals to include 15-35% energy as protein, 25-35% energy from fat, 35-60% energy from carbohydrates, less than 234m of dietary cholesterol, 20-35 gm of total fiber daily;Understanding of distribution of calorie intake throughout the day with the  consumption of 4-5 meals/snacks    Tobacco Cessation Yes    Number of packs per day 1/2    Intervention Assist the participant in steps to quit. Provide individualized education and counseling about committing to Tobacco Cessation, relapse prevention, and pharmacological support that can be provided by physician.;OAdvice worker assist with locating and accessing local/national Quit Smoking programs, and support quit date choice.    Expected Outcomes Short Term: Will demonstrate readiness to quit, by selecting a quit date.;Short Term: Will quit all tobacco product use, adhering to prevention of relapse plan.;Long Term: Complete abstinence from all tobacco products  for at least 12 months from quit date.    Improve shortness of breath with ADL's Yes    Intervention Provide education, individualized exercise plan and daily activity instruction to help decrease symptoms of SOB with activities of daily living.    Expected Outcomes Short Term: Improve cardiorespiratory fitness to achieve a reduction of symptoms when performing ADLs;Long Term: Be able to perform more ADLs without symptoms or delay the onset of symptoms    Diabetes Yes    Intervention Provide education about signs/symptoms and action to take for hypo/hyperglycemia.;Provide education about proper nutrition, including hydration, and aerobic/resistive exercise prescription along with prescribed medications to achieve blood glucose in normal ranges: Fasting glucose 65-99 mg/dL    Expected Outcomes Short Term: Participant verbalizes understanding of the signs/symptoms and immediate care of hyper/hypoglycemia, proper foot care and importance of medication, aerobic/resistive exercise and nutrition plan for blood glucose control.;Long Term: Attainment of HbA1C < 7%.    Heart Failure Yes    Intervention Provide a combined exercise and nutrition program that is supplemented with education, support and counseling about heart failure.  Directed toward relieving symptoms such as shortness of breath, decreased exercise tolerance, and extremity edema.    Expected Outcomes Improve functional capacity of life;Short term: Attendance in program 2-3 days a week with increased exercise capacity. Reported lower sodium intake. Reported increased fruit and vegetable intake. Reports medication compliance.;Short term: Daily weights obtained and reported for increase. Utilizing diuretic protocols set by physician.;Long term: Adoption of self-care skills and reduction of barriers for early signs and symptoms recognition and intervention leading to self-care maintenance.    Hypertension Yes    Intervention Provide education on lifestyle modifcations including regular physical activity/exercise, weight management, moderate sodium restriction and increased consumption of fresh fruit, vegetables, and low fat dairy, alcohol moderation, and smoking cessation.;Monitor prescription use compliance.    Expected Outcomes Short Term: Continued assessment and intervention until BP is < 140/32m HG in hypertensive participants. < 130/868mHG in hypertensive participants with diabetes, heart failure or chronic kidney disease.;Long Term: Maintenance of blood pressure at goal levels.    Lipids Yes    Intervention Provide education and support for participant on nutrition & aerobic/resistive exercise along with prescribed medications to achieve LDL <7047mHDL >85m51m  Expected Outcomes Short Term: Participant states understanding of desired cholesterol values and is compliant with medications prescribed. Participant is following exercise prescription and nutrition guidelines.;Long Term: Cholesterol controlled with medications as prescribed, with individualized exercise RX and with personalized nutrition plan. Value goals: LDL < 70mg18mL > 40 mg.           Education:Diabetes - Individual verbal and written instruction to review signs/symptoms of diabetes, desired  ranges of glucose level fasting, after meals and with exercise. Acknowledge that pre and post exercise glucose checks will be done for 3 sessions at entry of program. Flowsheet [facility]  Date 01/08/20  Educator [facility] Review Code 1- Verbalizes Understanding      Know Your Numbers and Heart Failure: - Group verbal and visual instruction to discuss disease risk factors for cardiac and pulmonary disease and treatment options.  Reviews associated critical values for Overweight/Obesity, Hypertension, Cholesterol, and Diabetes.  Discusses basics of heart failure: signs/symptoms and treatments.  Introduces Heart Failure Zone chart for action plan for heart failure.  Written material given at graduation.   Core Components/Risk Factors/Patient Goals Review:   Goals and Risk Factor Review  [place] Name 01/09/20 1532             Core Components/Risk Factors/Patient Goals Review   Personal Goals Review Tobacco Cessation       Review Leib is a current tobacco user. Intervention for tobacco cessation was provided at the initial medical review. He was asked about readiness to quit and reported that he is ready to quit again . Patient was advised and educated about tobacco cessation using combination therapy, tobacco cessation classes, quit line, and quit smoking apps. He already has patches and is ready to start to use them.  He declined handouts. Patient demonstrated understanding of this material. Staff will continue to provide encouragement and follow up with the patient throughout the program.       Expected Outcomes Short: Start using patches Long: Set quit date              Core Components/Risk Factors/Patient Goals at Discharge (Final Review):   Goals and Risk Factor Review - 01/09/20 1532      Core Components/Risk Factors/Patient Goals Review   Personal Goals Review Tobacco Cessation    Review Tayden is a current  tobacco user. Intervention for tobacco cessation was provided at the initial medical review. He was asked about readiness to quit and reported that he is ready to quit again . Patient was advised and educated about tobacco cessation using combination therapy, tobacco cessation classes, quit line, and quit smoking apps. He already has patches and is ready to start to use them.  He declined handouts. Patient demonstrated understanding of this material. Staff will continue to provide encouragement and follow up with the patient throughout the program.    Expected Outcomes Short: Start using patches Long: Set quit date           ITP Comments:  ITP Comments    Row Name 01/08/20 1419 01/09/20 1527 01/17/20 0650 01/17/20 0903 01/24/20 1510   ITP Comments Virtual Visit completed. Patient informed on EP and RD appointment and 6 Minute walk test. Patient also informed of patient health questionnaires on My Chart. Patient Verbalizes understanding. Visit diagnosis can be found in [facility] 10/24/2019. Completed 6MWT and gym orientation. Initial ITP created and sent for review to Dr. Emily Filbert, Medical Director. 30 Day review completed. Medical Director ITP review done, changes made as directed, and signed approval by Medical Director. Pt called out today.  Still not feeling well.  He was encouraged to contact his PCP to advise on meds and was encouraged to get tested.  He will be out for the week. Pt did present to ED and was diagnosised and admitted with pnuemonia.  He has a follow up appt on 01/25/20 from his discharge.   [place] Name 02/01/20 1027 02/14/20 0955 02/21/20 1003 02/28/20 1526 03/13/20 0602   ITP Comments Currently admitted at [facility] after sycopal episode in office. 30 Day review completed. Medical Director ITP review done, changes made as directed, and signed approval by Medical Director. Remains out for medical reasons Called to check up on patient.  Out since recent admission last month. Called to  check up on patient.  Out since orientation - last heard from him 01/17/20. Sent letter. 30 Day review completed. Medical Director ITP review done, changes made as directed, and signed approval by Medical Director.          Comments:

## 2020-03-14 ENCOUNTER — Ambulatory Visit: Payer: BC Managed Care – PPO

## 2020-03-14 DIAGNOSIS — J449 Chronic obstructive pulmonary disease, unspecified: Secondary | ICD-10-CM

## 2020-03-14 NOTE — Progress Notes (Signed)
Discharge Progress Report  Patient Details  Name: Jose [name] MRN: [id number] Date of Birth: October 31, 1955 Referring Provider:   Flowsheet Row Pulmonary Rehab from 01/09/2020 in [facility] Cardiac and Pulmonary Rehab  Referring Provider Roque Lias MD       Number of Visits: 1  Reason for Discharge:  Early Exit:  Personal  Smoking History:  Social History   Tobacco Use  Smoking Status Current Every Day Smoker  . Packs/day: 0.50  . Years: 40.00  . Pack years: 20.00  . Types: Cigarettes  . Last attempt to quit: 01/15/2019  . Years since quitting: 1.1  Smokeless Tobacco Never Used  Tobacco Comment   has started smoking again, willing to quit.    Diagnosis:  Chronic obstructive pulmonary disease, unspecified COPD type (HCC)  ADL UCSD:  Pulmonary Assessment Scores    Row Name 01/09/20 1535         ADL UCSD   ADL Phase Entry     SOB Score total 114     Rest 1     Walk 4     Stairs 5     Bath 5     Dress 5     Shop 5           CAT Score   CAT Score 28           mMRC Score   mMRC Score 4            Initial Exercise Prescription:  Initial Exercise Prescription - 01/09/20 1500      Date of Initial Exercise RX and Referring Provider   Date 01/09/20    Referring Provider Roque Lias MD      Treadmill   MPH 0.7    Grade 0    Minutes 15    METs 1.5      NuStep   Level 1    SPM 80    Minutes 15    METs 1.5      T5 Nustep   Level 1    SPM 80    Minutes 15    METs 1.5      Biostep-RELP   Level 1    SPM 50    Minutes 15    METs 1      Prescription Details   Frequency (times per week) 3    Duration Progress to 30 minutes of continuous aerobic without signs/symptoms of physical distress      Intensity   THRR 40-80% of Max Heartrate 107-140    Ratings of Perceived Exertion 11-13    Perceived Dyspnea 0-4      Progression   Progression Continue to progress workloads to maintain intensity without signs/symptoms of physical distress.       Resistance Training   Training Prescription Yes    Weight 3 lbs    Reps 10-15           Discharge Exercise Prescription (Final Exercise Prescription Changes):   Functional Capacity:  6 Minute Walk    Row Name 01/09/20 1527         6 Minute Walk   Phase Initial     Distance 385 feet     Walk Time 5.02 minutes     # of Rest Breaks 3  9 sec, 12 sec, 8 sec, stopped at 5:30     MPH 0.87     METS 1     RPE 19     Perceived Dyspnea  4  VO2 Peak 3.52     Symptoms Yes (comment)     Comments SOB, back/leg spasms 8-10/10     Resting HR 75 bpm     Resting BP 148/74     Resting Oxygen Saturation  94 %     Exercise Oxygen Saturation  during 6 min walk 88 %     Max Ex. HR 96 bpm     Max Ex. BP 164/70     2 Minute Post BP 156/74           Interval HR   1 Minute HR 93     2 Minute HR 92     3 Minute HR 96     4 Minute HR 93     5 Minute HR 94     6 Minute HR 93     2 Minute Post HR 90     Interval Heart Rate? Yes           Interval Oxygen   Interval Oxygen? Yes     Baseline Oxygen Saturation % 94 %     1 Minute Oxygen Saturation % 90 %     1 Minute Liters of Oxygen 0 L  Room Air     2 Minute Oxygen Saturation % 91 %     2 Minute Liters of Oxygen 0 L     3 Minute Oxygen Saturation % 90 %     3 Minute Liters of Oxygen 0 L     4 Minute Oxygen Saturation % 91 %     4 Minute Liters of Oxygen 0 L     5 Minute Oxygen Saturation % 92 %     5 Minute Liters of Oxygen 0 L     6 Minute Oxygen Saturation % 88 %     6 Minute Liters of Oxygen 0 L     2 Minute Post Oxygen Saturation % 93 %     2 Minute Post Liters of Oxygen 0 L            Psychological, QOL, Others - Outcomes: PHQ 2/9: Depression screen [facility] 2/9 01/09/2020 02/03/2018 10/04/2017  Decreased Interest 2 1 1   Down, Depressed, Hopeless 2 1 1   PHQ - 2 Score 4 2 2   Altered sleeping 3 1 2   Tired, decreased energy 3 2 3   Change in appetite 1 2 1   Feeling bad or failure about yourself  2 1 1   Trouble  concentrating 1 0 0  Moving slowly or fidgety/restless 0 0 0  Suicidal thoughts 1 0 0  PHQ-9 Score 15 8 9   Difficult doing work/chores Somewhat difficult Somewhat difficult Somewhat difficult             Nutrition & Weight - Outcomes:  Pre Biometrics - 01/09/20 1533      Pre Biometrics   Height 5\' 7"  (1.702 m)    Weight 266 lb 1.6 oz (120.7 kg)    BMI (Calculated) 41.67    Single Leg Stand 0.9 seconds              t

## 2020-03-14 NOTE — Progress Notes (Signed)
Pulmonary Individual Treatment Plan  Patient Details  Name: Jose [name] MRN: [id number] Date of Birth: 03/28/1955 Referring Provider:   Flowsheet Row Pulmonary Rehab from 01/09/2020 in [facility] Cardiac and Pulmonary Rehab  Referring Provider Hulan Fray MD      Initial Encounter Date:  Flowsheet Row Pulmonary Rehab from 01/09/2020 in [facility] Cardiac and Pulmonary Rehab  Date 01/09/20      Visit Diagnosis: Chronic obstructive pulmonary disease, unspecified COPD type ([place])  Patient's Home Medications on Admission:  Current Outpatient Medications:  .  albuterol (PROVENTIL) (5 MG/ML) 0.5% nebulizer solution, Take 0.5 mLs (2.5 mg total) by nebulization every 6 (six) hours as needed for wheezing or shortness of breath., Disp: 20 mL, Rfl: 12 .  albuterol (VENTOLIN HFA) 108 (90 Base) MCG/ACT inhaler, Inhale 1 puff into the lungs every 6 (six) hours as needed for wheezing or shortness of breath., Disp: , Rfl:  .  aspirin EC 81 MG EC tablet, Take 1 tablet (81 mg total) by mouth daily. Swallow whole., Disp: 30 tablet, Rfl: 11 .  Blood Glucose Monitoring Suppl (CONTOUR NEXT MONITOR) w/Device KIT, by Does not apply route., Disp: , Rfl:  .  Blood Glucose Monitoring Suppl (GLUCOCOM BLOOD GLUCOSE MONITOR) DEVI, 1 each by XX route as directed (For Blood Glucose Monitoring), Disp: , Rfl:  .  DULoxetine (CYMBALTA) 60 MG capsule, Take 60 mg by mouth once a week. , Disp: , Rfl:  .  enoxaparin (LOVENOX) 30 MG/0.3ML injection, Inject 1.1 mLs (110 mg total) into the skin 2 (two) times daily for 2 days., Disp: 4.4 mL, Rfl: 0 .  Fluticasone Propionate, Inhal, (FLOVENT DISKUS) 100 MCG/BLIST AEPB, Inhale 1 puff into the lungs daily as needed (wheezing & shortness of breath)., Disp: , Rfl:  .  gabapentin (NEURONTIN) 600 MG tablet, Take 600 mg by mouth 3 (three) times daily., Disp: , Rfl:  .  glucose blood (CONTOUR NEXT TEST) test strip, Use to check blood sugar twice 2 time(s) daily., Disp: , Rfl:  .   HYDROcodone-acetaminophen (NORCO) 10-325 MG tablet, Take 1 tablet by mouth 2 (two) times daily. , Disp: , Rfl:  .  ipratropium-albuterol (DUONEB) 0.5-2.5 (3) MG/3ML SOLN, Inhale 3 mLs into the lungs at bedtime. , Disp: , Rfl:  .  Lancets (ACCU-CHEK SOFT TOUCH) lancets, 2 (two) times daily., Disp: , Rfl:  .  metoprolol tartrate (LOPRESSOR) 12.5 mg TABS tablet, Take 12.5 mg by mouth 2 (two) times daily.  (Patient not taking: Reported on 01/18/2020), Disp: , Rfl:  .  nicotine (NICODERM CQ - DOSED IN MG/24 HOURS) 14 mg/24hr patch, Apply one patch daily x 2 weeks.  Begin after completion of 21 mg patches, Disp: , Rfl:  .  nitroGLYCERIN (NITROSTAT) 0.4 MG SL tablet, Place 0.4 mg under the tongue every 5 (five) minutes as needed for chest pain. , Disp: , Rfl: 1 .  potassium chloride (KLOR-CON) 20 MEQ packet, Take 40 mEq by mouth 2 (two) times daily., Disp: 2 packet, Rfl: 0 .  PROAIR HFA 108 (90 Base) MCG/ACT inhaler, Inhale 1-2 puffs into the lungs every 4 (four) hours as needed for wheezing. , Disp: , Rfl: 12 .  rosuvastatin (CRESTOR) 10 MG tablet, Take 10 mg by mouth every evening., Disp: , Rfl: 11 .  tamsulosin (FLOMAX) 0.4 MG CAPS capsule, Take 0.4 mg by mouth daily., Disp: , Rfl: 0 .  Tiotropium Bromide-Olodaterol (STIOLTO RESPIMAT) 2.5-2.5 MCG/ACT AERS, Inhale 2 puffs into the lungs daily. , Disp: , Rfl:  .  tiZANidine (  ZANAFLEX) 4 MG tablet, Take 8 mg by mouth in the morning and at bedtime. , Disp: , Rfl:  .  topiramate (TOPAMAX) 50 MG tablet, Take 50 mg by mouth 2 (two) times daily., Disp: , Rfl: 0 .  warfarin (COUMADIN) 6 MG tablet, Take 1 tablet (6 mg total) by mouth daily for 2 days., Disp: 2 tablet, Rfl: 0 .  warfarin (COUMADIN) 7.5 MG tablet, Take 1 tablet (7.5 mg total) by mouth at bedtime for 1 dose., Disp: 1 tablet, Rfl: 0  Past Medical History: Past Medical History:  Diagnosis Date  . Antiphospholipid antibody syndrome ([place])   . CAD S/P percutaneous coronary angioplasty '98, '04, Feb  2017   [facility]  . Carotid arterial disease ([place]) 10/2015   moderate bilateral  . COPD (chronic obstructive pulmonary disease) ([place])   . Coronary artery disease   . Hypertension   . Non-insulin treated type 2 diabetes mellitus ([place])   . Pulmonary emboli ([place]) 2011  . RBBB   . Sleep apnea     Tobacco Use: Social History   Tobacco Use  Smoking Status Current Every Day Smoker  . Packs/day: 0.50  . Years: 40.00  . Pack years: 20.00  . Types: Cigarettes  . Last attempt to quit: 01/15/2019  . Years since quitting: 1.1  Smokeless Tobacco Never Used  Tobacco Comment   has started smoking again, willing to quit.    Labs: Recent Review Flowsheet Data    Labs for ITP Cardiac and Pulmonary Rehab Latest Ref Rng & Units 08/16/2016 12/27/2018 03/15/2019 01/19/2020 01/20/2020   Cholestrol 0 - 200 mg/dL 156 - - - -   LDLCALC 0 - 99 mg/dL 64 - - - -   HDL >40 mg/dL 28(L) - - - -   Trlycerides <150 mg/dL 322(H) - - - -   Hemoglobin A1c 4.8 - 5.6 % 5.9(H) - 6.9(H) 8.7(H) -   PHART 7.350 - 7.450 - - - - 7.302(L)   PCO2ART 32.0 - 48.0 mmHg - - - - 72.9(HH)   HCO3 20.0 - 28.0 mmol/L - 31.8(H) - - 35.0(H)   TCO2 22 - 32 mmol/L - 34(H) - - -   O2SAT % - 95.0 - - 94.9       Pulmonary Assessment Scores:  Pulmonary Assessment Scores    Row Name 01/09/20 1535         ADL UCSD   ADL Phase Entry     SOB Score total 114     Rest 1     Walk 4     Stairs 5     Bath 5     Dress 5     Shop 5           CAT Score   CAT Score 28           mMRC Score   mMRC Score 4            UCSD: Self-administered rating of dyspnea associated with activities of daily living (ADLs) 6-point scale (0 = "not at all" to 5 = "maximal or unable to do because of breathlessness")  Scoring Scores range from 0 to 120.  Minimally important difference is 5 units  CAT: CAT can identify the health impairment of COPD patients and is better correlated with disease progression.  CAT has a scoring range of zero to 40.  The CAT score is classified into four groups of low (less than 10), medium (10 - 20), high (21-30) and  very high (31-40) based on the impact level of disease on health status. A CAT score over 10 suggests significant symptoms.  A worsening CAT score could be explained by an exacerbation, poor medication adherence, poor inhaler technique, or progression of COPD or comorbid conditions.  CAT MCID is 2 points  mMRC: mMRC (Modified Medical Research Council) Dyspnea Scale is used to assess the degree of baseline functional disability in patients of respiratory disease due to dyspnea. No minimal important difference is established. A decrease in score of 1 point or greater is considered a positive change.   Pulmonary Function Assessment:  Pulmonary Function Assessment - 01/08/20 1347      Breath   Shortness of Breath Limiting activity;Yes           Exercise Target Goals: Exercise Program Goal: Individual exercise prescription set using results from initial 6 min walk test and THRR while considering  patient's activity barriers and safety.   Exercise Prescription Goal: Initial exercise prescription builds to 30-45 minutes a day of aerobic activity, 2-3 days per week.  Home exercise guidelines will be given to patient during program as part of exercise prescription that the participant will acknowledge.  Education: Aerobic Exercise: - Group verbal and visual presentation on the components of exercise prescription. Introduces F.I.T.T principle from ACSM for exercise prescriptions.  Reviews F.I.T.T. principles of aerobic exercise including progression. Written material given at graduation. Flowsheet Row Cardiac Rehab from 02/16/2018 in [facility] Cardiac and Pulmonary Rehab  Date 10/18/17  Educator [facility]  Instruction Review Code 1- [employer] Understanding      Education: Resistance Exercise: - Group verbal and visual presentation on the components of exercise prescription. Introduces F.I.T.T principle  from ACSM for exercise prescriptions  Reviews F.I.T.T. principles of resistance exercise including progression. Written material given at graduation.    Education: Exercise & Equipment Safety: - Individual verbal instruction and demonstration of equipment use and safety with use of the equipment. Flowsheet Row Pulmonary Rehab from 01/09/2020 in [facility] Cardiac and Pulmonary Rehab  Date 01/08/20  Educator [facility]  Instruction Review Code 1- Verbalizes Understanding      Education: Exercise Physiology & General Exercise Guidelines: - Group verbal and written instruction with models to review the exercise physiology of the cardiovascular system and associated critical values. Provides general exercise guidelines with specific guidelines to those with heart or lung disease.  Flowsheet Row Pulmonary Rehab from 01/09/2020 in [facility] Cardiac and Pulmonary Rehab  Education need identified 01/09/20      Education: Flexibility, Balance, Mind/Body Relaxation: - Group verbal and visual presentation with interactive activity on the components of exercise prescription. Introduces F.I.T.T principle from ACSM for exercise prescriptions. Reviews F.I.T.T. principles of flexibility and balance exercise training including progression. Also discusses the mind body connection.  Reviews various relaxation techniques to help reduce and manage stress (i.e. Deep breathing, progressive muscle relaxation, and visualization). Balance handout provided to take home. Written material given at graduation. Flowsheet Row Cardiac Rehab from 02/16/2018 in [facility] Cardiac and Pulmonary Rehab  Date 02/07/18  Educator AS  Instruction Review Code 1- Verbalizes Understanding      Activity Barriers & Risk Stratification:  Activity Barriers & Cardiac Risk Stratification - 01/09/20 1529      Activity Barriers & Cardiac Risk Stratification   Activity Barriers Arthritis;Back Problems;Neck/Spine Problems;Muscular Weakness;Deconditioning;Balance  Concerns;Shortness of Breath;Decreased Ventricular Function           6 Minute Walk:  6 Minute Walk    Row Name 01/09/20 1527  6 Minute Walk   Phase Initial     Distance 385 feet     Walk Time 5.02 minutes     # of Rest Breaks 3  9 sec, 12 sec, 8 sec, stopped at 5:30     MPH 0.87     METS 1     RPE 19     Perceived Dyspnea  4     VO2 Peak 3.52     Symptoms Yes (comment)     Comments SOB, back/leg spasms 8-10/10     Resting HR 75 bpm     Resting BP 148/74     Resting Oxygen Saturation  94 %     Exercise Oxygen Saturation  during 6 min walk 88 %     Max Ex. HR 96 bpm     Max Ex. BP 164/70     2 Minute Post BP 156/74           Interval HR   1 Minute HR 93     2 Minute HR 92     3 Minute HR 96     4 Minute HR 93     5 Minute HR 94     6 Minute HR 93     2 Minute Post HR 90     Interval Heart Rate? Yes           Interval Oxygen   Interval Oxygen? Yes     Baseline Oxygen Saturation % 94 %     1 Minute Oxygen Saturation % 90 %     1 Minute Liters of Oxygen 0 L  Room Air     2 Minute Oxygen Saturation % 91 %     2 Minute Liters of Oxygen 0 L     3 Minute Oxygen Saturation % 90 %     3 Minute Liters of Oxygen 0 L     4 Minute Oxygen Saturation % 91 %     4 Minute Liters of Oxygen 0 L     5 Minute Oxygen Saturation % 92 %     5 Minute Liters of Oxygen 0 L     6 Minute Oxygen Saturation % 88 %     6 Minute Liters of Oxygen 0 L     2 Minute Post Oxygen Saturation % 93 %     2 Minute Post Liters of Oxygen 0 L           Oxygen Initial Assessment:  Oxygen Initial Assessment - 01/08/20 1343      Home Oxygen   Home Oxygen Device Home Concentrator    Sleep Oxygen Prescription BiPAP   Does not wear since it is uncomfortable and wants a CPAP instead.   Liters per minute 3    Home Exercise Oxygen Prescription None    Home Resting Oxygen Prescription None    Compliance with Home Oxygen Use No      Initial 6 min Walk   Oxygen Used None      Program  Oxygen Prescription   Program Oxygen Prescription None      Intervention   Short Term Goals To learn and exhibit compliance with exercise, home and travel O2 prescription;To learn and understand importance of monitoring SPO2 with pulse oximeter and demonstrate accurate use of the pulse oximeter.;To learn and demonstrate proper pursed lip breathing techniques or other breathing techniques.;To learn and demonstrate proper use of respiratory medications;To learn and understand importance of maintaining oxygen saturations>88%  Long  Term Goals Exhibits compliance with exercise, home and travel O2 prescription;Maintenance of O2 saturations>88%;Compliance with respiratory medication;Demonstrates proper use of MDI's;Exhibits proper breathing techniques, such as pursed lip breathing or other method taught during program session;Verbalizes importance of monitoring SPO2 with pulse oximeter and return demonstration           Oxygen Re-Evaluation:   Oxygen Discharge (Final Oxygen Re-Evaluation):   Initial Exercise Prescription:  Initial Exercise Prescription - 01/09/20 1500      Date of Initial Exercise RX and Referring Provider   Date 01/09/20    Referring Provider Hulan Fray MD      Treadmill   MPH 0.7    Grade 0    Minutes 15    METs 1.5      NuStep   Level 1    SPM 80    Minutes 15    METs 1.5      T5 Nustep   Level 1    SPM 80    Minutes 15    METs 1.5      Biostep-RELP   Level 1    SPM 50    Minutes 15    METs 1      Prescription Details   Frequency (times per week) 3    Duration Progress to 30 minutes of continuous aerobic without signs/symptoms of physical distress      Intensity   THRR 40-80% of Max Heartrate 107-140    Ratings of Perceived Exertion 11-13    Perceived Dyspnea 0-4      Progression   Progression Continue to progress workloads to maintain intensity without signs/symptoms of physical distress.      Resistance Training   Training  Prescription Yes    Weight 3 lbs    Reps 10-15           Perform Capillary Blood Glucose checks as needed.  Exercise Prescription Changes:   Exercise Comments:   Exercise Goals and Review:  Exercise Goals    Row Name 01/09/20 1531             Exercise Goals   Increase Physical Activity Yes       Intervention Provide advice, education, support and counseling about physical activity/exercise needs.;Develop an individualized exercise prescription for aerobic and resistive training based on initial evaluation findings, risk stratification, comorbidities and participant's personal goals.       Expected Outcomes Short Term: Attend rehab on a regular basis to increase amount of physical activity.;Long Term: Add in home exercise to make exercise part of routine and to increase amount of physical activity.;Long Term: Exercising regularly at least 3-5 days a week.       Increase Strength and Stamina Yes       Intervention Provide advice, education, support and counseling about physical activity/exercise needs.;Develop an individualized exercise prescription for aerobic and resistive training based on initial evaluation findings, risk stratification, comorbidities and participant's personal goals.       Expected Outcomes Short Term: Increase workloads from initial exercise prescription for resistance, speed, and METs.;Short Term: Perform resistance training exercises routinely during rehab and add in resistance training at home;Long Term: Improve cardiorespiratory fitness, muscular endurance and strength as measured by increased METs and functional capacity (6MWT)       Able to understand and use rate of perceived exertion (RPE) scale Yes       Intervention Provide education and explanation on how to use RPE scale       Expected  Outcomes Short Term: Able to use RPE daily in rehab to express subjective intensity level;Long Term:  Able to use RPE to guide intensity level when exercising  independently       Able to understand and use Dyspnea scale Yes       Intervention Provide education and explanation on how to use Dyspnea scale       Expected Outcomes Short Term: Able to use Dyspnea scale daily in rehab to express subjective sense of shortness of breath during exertion;Long Term: Able to use Dyspnea scale to guide intensity level when exercising independently       Knowledge and understanding of Target Heart Rate Range (THRR) Yes       Intervention Provide education and explanation of THRR including how the numbers were predicted and where they are located for reference       Expected Outcomes Short Term: Able to state/look up THRR;Short Term: Able to use daily as guideline for intensity in rehab;Long Term: Able to use THRR to govern intensity when exercising independently       Able to check pulse independently Yes       Intervention Provide education and demonstration on how to check pulse in carotid and radial arteries.;Review the importance of being able to check your own pulse for safety during independent exercise       Expected Outcomes Short Term: Able to explain why pulse checking is important during independent exercise;Long Term: Able to check pulse independently and accurately       Understanding of Exercise Prescription Yes       Intervention Provide education, explanation, and written materials on patient's individual exercise prescription       Expected Outcomes Short Term: Able to explain program exercise prescription;Long Term: Able to explain home exercise prescription to exercise independently              Exercise Goals Re-Evaluation :  Exercise Goals Re-Evaluation    Row Name 01/24/20 1512             Exercise Goal Re-Evaluation   Comments Has yet to start exercise in rehab due to admission              Discharge Exercise Prescription (Final Exercise Prescription Changes):   Nutrition:  Target Goals: Understanding of nutrition guidelines,  daily intake of sodium '1500mg'$ , cholesterol '200mg'$ , calories 30% from fat and 7% or less from saturated fats, daily to have 5 or more servings of fruits and vegetables.  Education: All About Nutrition: -Group instruction provided by verbal, written material, interactive activities, discussions, models, and posters to present general guidelines for heart healthy nutrition including fat, fiber, MyPlate, the role of sodium in heart healthy nutrition, utilization of the nutrition label, and utilization of this knowledge for meal planning. Follow up email sent as well. Written material given at graduation.   Biometrics:  Pre Biometrics - 01/09/20 1533      Pre Biometrics   Height $Remov'5\' 7"'lenSpT$  (1.702 m)    Weight 266 lb 1.6 oz (120.7 kg)    BMI (Calculated) 41.67    Single Leg Stand 0.9 seconds            Nutrition Therapy Plan and Nutrition Goals:   Nutrition Assessments:  Nutrition Assessments - 01/09/20 1533      MEDFICTS Scores   Pre Score 102          MEDIFICTS Score Key:  ?70 Need to make dietary changes   40-70 Heart Healthy  Diet  ? 40 Therapeutic Level Cholesterol Diet   Picture Your Plate Scores:  <42 Unhealthy dietary pattern with much room for improvement.  41-50 Dietary pattern unlikely to meet recommendations for good health and room for improvement.  51-60 More healthful dietary pattern, with some room for improvement.   >60 Healthy dietary pattern, although there may be some specific behaviors that could be improved.   Nutrition Goals Re-Evaluation:   Nutrition Goals Discharge (Final Nutrition Goals Re-Evaluation):   Psychosocial: Target Goals: Acknowledge presence or absence of significant depression and/or stress, maximize coping skills, provide positive support system. Participant is able to verbalize types and ability to use techniques and skills needed for reducing stress and depression.   Education: Stress, Anxiety, and Depression - Group verbal  and visual presentation to define topics covered.  Reviews how body is impacted by stress, anxiety, and depression.  Also discusses healthy ways to reduce stress and to treat/manage anxiety and depression.  Written material given at graduation.   Education: Sleep Hygiene -Provides group verbal and written instruction about how sleep can affect your health.  Define sleep hygiene, discuss sleep cycles and impact of sleep habits. Review good sleep hygiene tips.  Flowsheet Row Cardiac Rehab from 02/16/2018 in [facility] Cardiac and Pulmonary Rehab  Date 10/06/17  Educator Lucianne Lei, MSW  Instruction Review Code 1- Verbalizes Understanding      Initial Review & Psychosocial Screening:  Initial Psych Review & Screening - 01/08/20 1348      Initial Review   Current issues with Current Stress Concerns    Source of Stress Concerns Chronic Illness    Comments His breathing has been tough for him and makes it heard for him to do his daily activities. He wants to be able to move more and breath easier so he can live life better.      Family Dynamics   Good Support System? Yes    Comments He can look to his wife for support. His daughter and son in law just moved into his sub division.      Barriers   Psychosocial barriers to participate in program The patient should benefit from training in stress management and relaxation.      Screening Interventions   Interventions To provide support and resources with identified psychosocial needs;Provide feedback about the scores to participant;Encouraged to exercise    Expected Outcomes Short Term goal: Utilizing psychosocial counselor, staff and physician to assist with identification of specific Stressors or current issues interfering with healing process. Setting desired goal for each stressor or current issue identified.;Long Term Goal: Stressors or current issues are controlled or eliminated.;Short Term goal: Identification and review with participant of any  Quality of Life or Depression concerns found by scoring the questionnaire.;Long Term goal: The participant improves quality of Life and PHQ9 Scores as seen by post scores and/or verbalization of changes           Quality of Life Scores:  Scores of 19 and below usually indicate a poorer quality of life in these areas.  A difference of  2-3 points is a clinically meaningful difference.  A difference of 2-3 points in the total score of the Quality of Life Index has been associated with significant improvement in overall quality of life, self-image, physical symptoms, and general health in studies assessing change in quality of life.  PHQ-9: Recent Review Flowsheet Data    Depression screen [facility] 2/9 01/09/2020 02/03/2018 10/04/2017   Decreased Interest 2 1 1  Down, Depressed, Hopeless _0 PHQ - 2 Score _1 Altered sleeping _2 Tired, decreased energy _3 Change in appetite _4 Feeling bad or failure about yourself  _5 Trouble concentrating 1 0 0   Moving slowly or fidgety/restless 0 0 0   Suicidal thoughts 1  0 0   PHQ-9 Score _6 Difficult doing work/chores Somewhat difficult Somewhat difficult Somewhat difficult     Interpretation of Total Score  Total Score Depression Severity:  1-4 = Minimal depression, 5-9 = Mild depression, 10-14 = Moderate depression, 15-19 = Moderately severe depression, 20-27 = Severe depression   Psychosocial Evaluation and Intervention:  Psychosocial Evaluation - 01/08/20 1350      Psychosocial Evaluation & Interventions   Interventions Encouraged to exercise with the program and follow exercise prescription;Relaxation education;Stress management education    Comments His breathing has been tough for him and makes it heard for him to do his daily activities. He wants to be able to move more and breath easier so he can live life better.    Expected Outcomes Short: Exercise regularly to support mental health and notify staff of  any changes. Long: maintain mental health and well being through teaching of rehab or prescribed medications independently.    Continue Psychosocial Services  Follow up required by staff           Psychosocial Re-Evaluation:   Psychosocial Discharge (Final Psychosocial Re-Evaluation):   Education: Education Goals: Education classes will be provided on a weekly basis, covering required topics. Participant will state understanding/return demonstration of topics presented.  Learning Barriers/Preferences:  Learning Barriers/Preferences - 01/08/20 1348      Learning Barriers/Preferences   Learning Barriers None    Learning Preferences None           General Pulmonary Education Topics:  Infection Prevention: - Provides verbal and written material to individual with discussion of infection control including proper hand washing and proper equipment cleaning during exercise session. Flowsheet Row Pulmonary Rehab from 01/09/2020 in [facility] Cardiac and Pulmonary Rehab  Date 01/08/20  Educator [facility]  Instruction Review Code 1- Verbalizes Understanding      Falls Prevention: - Provides verbal and written material to individual with discussion of falls prevention and safety. Flowsheet Row Pulmonary Rehab from 01/09/2020 in [facility] Cardiac and Pulmonary Rehab  Date 01/08/20  Educator [facility]  Instruction Review Code 1- Verbalizes Understanding      Chronic Lung Disease Review: - Group verbal instruction with posters, models, PowerPoint presentations and videos,  to review new updates, new respiratory medications, new advancements in procedures and treatments. Providing information on websites and "800" numbers for continued self-education. Includes information about supplement oxygen, available portable oxygen systems, continuous and intermittent flow rates, oxygen safety, concentrators, and Medicare reimbursement for oxygen. Explanation of Pulmonary Drugs, including class, frequency,  complications, importance of spacers, rinsing mouth after steroid MDI's, and proper cleaning methods for nebulizers. Review of basic lung anatomy and physiology related to function, structure, and complications of lung disease. Review of risk factors. Discussion about methods for diagnosing sleep apnea and types of masks and machines for OSA. Includes a review of the use of types of environmental controls: home humidity, furnaces, filters, dust mite/pet prevention, HEPA vacuums. Discussion about weather changes, air quality and the benefits of nasal washing. Instruction on Warning signs, infection symptoms, calling MD promptly, preventive modes,  and value of vaccinations. Review of effective airway clearance, coughing and/or vibration techniques. Emphasizing that all should Create an Action Plan. Written material given at graduation. Flowsheet Row Pulmonary Rehab from 01/09/2020 in [facility] Cardiac and Pulmonary Rehab  Education need identified 01/09/20      AED/CPR: - Group verbal and written instruction with the use of models to demonstrate the basic use of the AED with the basic ABC's of resuscitation.    Anatomy and Cardiac Procedures: - Group verbal and visual presentation and models provide information about basic cardiac anatomy and function. Reviews the testing methods done to diagnose heart disease and the outcomes of the test results. Describes the treatment choices: Medical Management, Angioplasty, or Coronary Bypass Surgery for treating various heart conditions including Myocardial Infarction, Angina, Valve Disease, and Cardiac Arrhythmias.  Written material given at graduation.   Medication Safety: - Group verbal and visual instruction to review commonly prescribed medications for heart and lung disease. Reviews the medication, class of the drug, and side effects. Includes the steps to properly store meds and maintain the prescription regimen.  Written material given at  graduation.   Other: -Provides group and verbal instruction on various topics (see comments)   Knowledge Questionnaire Score:  Knowledge Questionnaire Score - 01/09/20 1534      Knowledge Questionnaire Score   Pre Score 15/18 educaiton focus: exercise, O2 safety, PLB            Core Components/Risk Factors/Patient Goals at Admission:  Personal Goals and Risk Factors at Admission - 01/09/20 1534      Core Components/Risk Factors/Patient Goals on Admission    Weight Management Yes;Obesity;Weight Loss    Intervention Weight Management: Develop a combined nutrition and exercise program designed to reach desired caloric intake, while maintaining appropriate intake of nutrient and fiber, sodium and fats, and appropriate energy expenditure required for the weight goal.;Weight Management: Provide education and appropriate resources to help participant work on and attain dietary goals.;Weight Management/Obesity: Establish reasonable short term and long term weight goals.;Obesity: Provide education and appropriate resources to help participant work on and attain dietary goals.    Admit Weight 266 lb 1.6 oz (120.7 kg)    Goal Weight: Short Term 260 lb (117.9 kg)    Goal Weight: Long Term 250 lb (113.4 kg)    Expected Outcomes Short Term: Continue to assess and modify interventions until short term weight is achieved;Long Term: Adherence to nutrition and physical activity/exercise program aimed toward attainment of established weight goal;Weight Loss: Understanding of general recommendations for a balanced deficit meal plan, which promotes 1-2 lb weight loss per week and includes a negative energy balance of [id number] kcal/d;Understanding recommendations for meals to include 15-35% energy as protein, 25-35% energy from fat, 35-60% energy from carbohydrates, less than 235m of dietary cholesterol, 20-35 gm of total fiber daily;Understanding of distribution of calorie intake throughout the day with the  consumption of 4-5 meals/snacks    Tobacco Cessation Yes    Number of packs per day 1/2    Intervention Assist the participant in steps to quit. Provide individualized education and counseling about committing to Tobacco Cessation, relapse prevention, and pharmacological support that can be provided by physician.;OAdvice worker assist with locating and accessing local/national Quit Smoking programs, and support quit date choice.    Expected Outcomes Short Term: Will demonstrate readiness to quit, by selecting a quit date.;Short Term: Will quit all tobacco product use, adhering to prevention of relapse plan.;Long Term: Complete abstinence from all tobacco products  for at least 12 months from quit date.    Improve shortness of breath with ADL's Yes    Intervention Provide education, individualized exercise plan and daily activity instruction to help decrease symptoms of SOB with activities of daily living.    Expected Outcomes Short Term: Improve cardiorespiratory fitness to achieve a reduction of symptoms when performing ADLs;Long Term: Be able to perform more ADLs without symptoms or delay the onset of symptoms    Diabetes Yes    Intervention Provide education about signs/symptoms and action to take for hypo/hyperglycemia.;Provide education about proper nutrition, including hydration, and aerobic/resistive exercise prescription along with prescribed medications to achieve blood glucose in normal ranges: Fasting glucose 65-99 mg/dL    Expected Outcomes Short Term: Participant verbalizes understanding of the signs/symptoms and immediate care of hyper/hypoglycemia, proper foot care and importance of medication, aerobic/resistive exercise and nutrition plan for blood glucose control.;Long Term: Attainment of HbA1C < 7%.    Heart Failure Yes    Intervention Provide a combined exercise and nutrition program that is supplemented with education, support and counseling about heart failure.  Directed toward relieving symptoms such as shortness of breath, decreased exercise tolerance, and extremity edema.    Expected Outcomes Improve functional capacity of life;Short term: Attendance in program 2-3 days a week with increased exercise capacity. Reported lower sodium intake. Reported increased fruit and vegetable intake. Reports medication compliance.;Short term: Daily weights obtained and reported for increase. Utilizing diuretic protocols set by physician.;Long term: Adoption of self-care skills and reduction of barriers for early signs and symptoms recognition and intervention leading to self-care maintenance.    Hypertension Yes    Intervention Provide education on lifestyle modifcations including regular physical activity/exercise, weight management, moderate sodium restriction and increased consumption of fresh fruit, vegetables, and low fat dairy, alcohol moderation, and smoking cessation.;Monitor prescription use compliance.    Expected Outcomes Short Term: Continued assessment and intervention until BP is < 140/32m HG in hypertensive participants. < 130/868mHG in hypertensive participants with diabetes, heart failure or chronic kidney disease.;Long Term: Maintenance of blood pressure at goal levels.    Lipids Yes    Intervention Provide education and support for participant on nutrition & aerobic/resistive exercise along with prescribed medications to achieve LDL <7047mHDL >85m51m  Expected Outcomes Short Term: Participant states understanding of desired cholesterol values and is compliant with medications prescribed. Participant is following exercise prescription and nutrition guidelines.;Long Term: Cholesterol controlled with medications as prescribed, with individualized exercise RX and with personalized nutrition plan. Value goals: LDL < 70mg18mL > 40 mg.           Education:Diabetes - Individual verbal and written instruction to review signs/symptoms of diabetes, desired  ranges of glucose level fasting, after meals and with exercise. Acknowledge that pre and post exercise glucose checks will be done for 3 sessions at entry of program. Flowsheet [facility]  Date 01/08/20  Educator [facility] Review Code 1- Verbalizes Understanding      Know Your Numbers and Heart Failure: - Group verbal and visual instruction to discuss disease risk factors for cardiac and pulmonary disease and treatment options.  Reviews associated critical values for Overweight/Obesity, Hypertension, Cholesterol, and Diabetes.  Discusses basics of heart failure: signs/symptoms and treatments.  Introduces Heart Failure Zone chart for action plan for heart failure.  Written material given at graduation.   Core Components/Risk Factors/Patient Goals Review:   Goals and Risk Factor Review  [place] Name 01/09/20 1532             Core Components/Risk Factors/Patient Goals Review   Personal Goals Review Tobacco Cessation       Review Sukhman is a current tobacco user. Intervention for tobacco cessation was provided at the initial medical review. He was asked about readiness to quit and reported that he is ready to quit again . Patient was advised and educated about tobacco cessation using combination therapy, tobacco cessation classes, quit line, and quit smoking apps. He already has patches and is ready to start to use them.  He declined handouts. Patient demonstrated understanding of this material. Staff will continue to provide encouragement and follow up with the patient throughout the program.       Expected Outcomes Short: Start using patches Long: Set quit date              Core Components/Risk Factors/Patient Goals at Discharge (Final Review):   Goals and Risk Factor Review - 01/09/20 1532      Core Components/Risk Factors/Patient Goals Review   Personal Goals Review Tobacco Cessation    Review Kaylum is a current  tobacco user. Intervention for tobacco cessation was provided at the initial medical review. He was asked about readiness to quit and reported that he is ready to quit again . Patient was advised and educated about tobacco cessation using combination therapy, tobacco cessation classes, quit line, and quit smoking apps. He already has patches and is ready to start to use them.  He declined handouts. Patient demonstrated understanding of this material. Staff will continue to provide encouragement and follow up with the patient throughout the program.    Expected Outcomes Short: Start using patches Long: Set quit date           ITP Comments:  ITP Comments    Row Name 01/08/20 1419 01/09/20 1527 01/17/20 0650 01/17/20 0903 01/24/20 1510   ITP Comments Virtual Visit completed. Patient informed on EP and RD appointment and 6 Minute walk test. Patient also informed of patient health questionnaires on My Chart. Patient Verbalizes understanding. Visit diagnosis can be found in [facility] 10/24/2019. Completed 6MWT and gym orientation. Initial ITP created and sent for review to Dr. Emily Filbert, Medical Director. 30 Day review completed. Medical Director ITP review done, changes made as directed, and signed approval by Medical Director. Pt called out today.  Still not feeling well.  He was encouraged to contact his PCP to advise on meds and was encouraged to get tested.  He will be out for the week. Pt did present to ED and was diagnosised and admitted with pnuemonia.  He has a follow up appt on 01/25/20 from his discharge.   [place] Name 02/01/20 1031 02/14/20 0955 02/21/20 1003 02/28/20 1526 03/13/20 0602   ITP Comments Currently admitted at [facility] after sycopal episode in office. 30 Day review completed. Medical Director ITP review done, changes made as directed, and signed approval by Medical Director. Remains out for medical reasons Called to check up on patient.  Out since recent admission last month. Called to  check up on patient.  Out since orientation - last heard from him 01/17/20. Sent letter. 30 Day review completed. Medical Director ITP review done, changes made as directed, and signed approval by Medical Director.          Comments: discharge ITP

## 2020-03-18 ENCOUNTER — Ambulatory Visit: Payer: BC Managed Care – PPO

## 2020-03-20 ENCOUNTER — Ambulatory Visit: Payer: BC Managed Care – PPO

## 2020-03-21 ENCOUNTER — Ambulatory Visit: Payer: BC Managed Care – PPO

## 2020-03-25 ENCOUNTER — Ambulatory Visit: Payer: BC Managed Care – PPO

## 2020-03-27 ENCOUNTER — Ambulatory Visit: Payer: BC Managed Care – PPO

## 2020-03-28 ENCOUNTER — Ambulatory Visit: Payer: BC Managed Care – PPO

## 2020-04-01 ENCOUNTER — Ambulatory Visit: Payer: BC Managed Care – PPO

## 2020-04-03 ENCOUNTER — Ambulatory Visit: Payer: BC Managed Care – PPO

## 2020-04-04 ENCOUNTER — Ambulatory Visit: Payer: BC Managed Care – PPO

## 2020-04-08 ENCOUNTER — Ambulatory Visit: Payer: BC Managed Care – PPO

## 2020-04-10 ENCOUNTER — Ambulatory Visit: Payer: BC Managed Care – PPO

## 2020-04-10 ENCOUNTER — Encounter: Payer: Self-pay | Admitting: *Deleted

## 2020-04-10 DIAGNOSIS — J449 Chronic obstructive pulmonary disease, unspecified: Secondary | ICD-10-CM

## 2020-04-10 NOTE — Progress Notes (Signed)
Pulmonary Individual Treatment Plan  Patient Details  Name: Jose [name] MRN: [id number] Date of Birth: 03/28/1955 Referring Provider:   Flowsheet Row Pulmonary Rehab from 01/09/2020 in [facility] Cardiac and Pulmonary Rehab  Referring Provider Hulan Fray MD      Initial Encounter Date:  Flowsheet Row Pulmonary Rehab from 01/09/2020 in [facility] Cardiac and Pulmonary Rehab  Date 01/09/20      Visit Diagnosis: Chronic obstructive pulmonary disease, unspecified COPD type ([place])  Patient's Home Medications on Admission:  Current Outpatient Medications:  .  albuterol (PROVENTIL) (5 MG/ML) 0.5% nebulizer solution, Take 0.5 mLs (2.5 mg total) by nebulization every 6 (six) hours as needed for wheezing or shortness of breath., Disp: 20 mL, Rfl: 12 .  albuterol (VENTOLIN HFA) 108 (90 Base) MCG/ACT inhaler, Inhale 1 puff into the lungs every 6 (six) hours as needed for wheezing or shortness of breath., Disp: , Rfl:  .  aspirin EC 81 MG EC tablet, Take 1 tablet (81 mg total) by mouth daily. Swallow whole., Disp: 30 tablet, Rfl: 11 .  Blood Glucose Monitoring Suppl (CONTOUR NEXT MONITOR) w/Device KIT, by Does not apply route., Disp: , Rfl:  .  Blood Glucose Monitoring Suppl (GLUCOCOM BLOOD GLUCOSE MONITOR) DEVI, 1 each by XX route as directed (For Blood Glucose Monitoring), Disp: , Rfl:  .  DULoxetine (CYMBALTA) 60 MG capsule, Take 60 mg by mouth once a week. , Disp: , Rfl:  .  enoxaparin (LOVENOX) 30 MG/0.3ML injection, Inject 1.1 mLs (110 mg total) into the skin 2 (two) times daily for 2 days., Disp: 4.4 mL, Rfl: 0 .  Fluticasone Propionate, Inhal, (FLOVENT DISKUS) 100 MCG/BLIST AEPB, Inhale 1 puff into the lungs daily as needed (wheezing & shortness of breath)., Disp: , Rfl:  .  gabapentin (NEURONTIN) 600 MG tablet, Take 600 mg by mouth 3 (three) times daily., Disp: , Rfl:  .  glucose blood (CONTOUR NEXT TEST) test strip, Use to check blood sugar twice 2 time(s) daily., Disp: , Rfl:  .   HYDROcodone-acetaminophen (NORCO) 10-325 MG tablet, Take 1 tablet by mouth 2 (two) times daily. , Disp: , Rfl:  .  ipratropium-albuterol (DUONEB) 0.5-2.5 (3) MG/3ML SOLN, Inhale 3 mLs into the lungs at bedtime. , Disp: , Rfl:  .  Lancets (ACCU-CHEK SOFT TOUCH) lancets, 2 (two) times daily., Disp: , Rfl:  .  metoprolol tartrate (LOPRESSOR) 12.5 mg TABS tablet, Take 12.5 mg by mouth 2 (two) times daily.  (Patient not taking: Reported on 01/18/2020), Disp: , Rfl:  .  nicotine (NICODERM CQ - DOSED IN MG/24 HOURS) 14 mg/24hr patch, Apply one patch daily x 2 weeks.  Begin after completion of 21 mg patches, Disp: , Rfl:  .  nitroGLYCERIN (NITROSTAT) 0.4 MG SL tablet, Place 0.4 mg under the tongue every 5 (five) minutes as needed for chest pain. , Disp: , Rfl: 1 .  potassium chloride (KLOR-CON) 20 MEQ packet, Take 40 mEq by mouth 2 (two) times daily., Disp: 2 packet, Rfl: 0 .  PROAIR HFA 108 (90 Base) MCG/ACT inhaler, Inhale 1-2 puffs into the lungs every 4 (four) hours as needed for wheezing. , Disp: , Rfl: 12 .  rosuvastatin (CRESTOR) 10 MG tablet, Take 10 mg by mouth every evening., Disp: , Rfl: 11 .  tamsulosin (FLOMAX) 0.4 MG CAPS capsule, Take 0.4 mg by mouth daily., Disp: , Rfl: 0 .  Tiotropium Bromide-Olodaterol (STIOLTO RESPIMAT) 2.5-2.5 MCG/ACT AERS, Inhale 2 puffs into the lungs daily. , Disp: , Rfl:  .  tiZANidine (  ZANAFLEX) 4 MG tablet, Take 8 mg by mouth in the morning and at bedtime. , Disp: , Rfl:  .  topiramate (TOPAMAX) 50 MG tablet, Take 50 mg by mouth 2 (two) times daily., Disp: , Rfl: 0 .  warfarin (COUMADIN) 6 MG tablet, Take 1 tablet (6 mg total) by mouth daily for 2 days., Disp: 2 tablet, Rfl: 0 .  warfarin (COUMADIN) 7.5 MG tablet, Take 1 tablet (7.5 mg total) by mouth at bedtime for 1 dose., Disp: 1 tablet, Rfl: 0  Past Medical History: Past Medical History:  Diagnosis Date  . Antiphospholipid antibody syndrome ([place])   . CAD S/P percutaneous coronary angioplasty '98, '04, Feb  2017   [facility]  . Carotid arterial disease ([place]) 10/2015   moderate bilateral  . COPD (chronic obstructive pulmonary disease) ([place])   . Coronary artery disease   . Hypertension   . Non-insulin treated type 2 diabetes mellitus ([place])   . Pulmonary emboli ([place]) 2011  . RBBB   . Sleep apnea     Tobacco Use: Social History   Tobacco Use  Smoking Status Current Every Day Smoker  . Packs/day: 0.50  . Years: 40.00  . Pack years: 20.00  . Types: Cigarettes  . Last attempt to quit: 01/15/2019  . Years since quitting: 1.2  Smokeless Tobacco Never Used  Tobacco Comment   has started smoking again, willing to quit.    Labs: Recent Review Flowsheet Data    Labs for ITP Cardiac and Pulmonary Rehab Latest Ref Rng & Units 08/16/2016 12/27/2018 03/15/2019 01/19/2020 01/20/2020   Cholestrol 0 - 200 mg/dL 156 - - - -   LDLCALC 0 - 99 mg/dL 64 - - - -   HDL >40 mg/dL 28(L) - - - -   Trlycerides <150 mg/dL 322(H) - - - -   Hemoglobin A1c 4.8 - 5.6 % 5.9(H) - 6.9(H) 8.7(H) -   PHART 7.350 - 7.450 - - - - 7.302(L)   PCO2ART 32.0 - 48.0 mmHg - - - - 72.9(HH)   HCO3 20.0 - 28.0 mmol/L - 31.8(H) - - 35.0(H)   TCO2 22 - 32 mmol/L - 34(H) - - -   O2SAT % - 95.0 - - 94.9       Pulmonary Assessment Scores:   UCSD: Self-administered rating of dyspnea associated with activities of daily living (ADLs) 6-point scale (0 = "not at all" to 5 = "maximal or unable to do because of breathlessness")  Scoring Scores range from 0 to 120.  Minimally important difference is 5 units  CAT: CAT can identify the health impairment of COPD patients and is better correlated with disease progression.  CAT has a scoring range of zero to 40. The CAT score is classified into four groups of low (less than 10), medium (10 - 20), high (21-30) and very high (31-40) based on the impact level of disease on health status. A CAT score over 10 suggests significant symptoms.  A worsening CAT score could be explained by an exacerbation,  poor medication adherence, poor inhaler technique, or progression of COPD or comorbid conditions.  CAT MCID is 2 points  mMRC: mMRC (Modified Medical Research Council) Dyspnea Scale is used to assess the degree of baseline functional disability in patients of respiratory disease due to dyspnea. No minimal important difference is established. A decrease in score of 1 point or greater is considered a positive change.   Pulmonary Function Assessment:   Exercise Target Goals: Exercise Program Goal: Individual exercise  prescription set using results from initial 6 min walk test and THRR while considering  patient's activity barriers and safety.   Exercise Prescription Goal: Initial exercise prescription builds to 30-45 minutes a day of aerobic activity, 2-3 days per week.  Home exercise guidelines will be given to patient during program as part of exercise prescription that the participant will acknowledge.  Education: Aerobic Exercise: - Group verbal and visual presentation on the components of exercise prescription. Introduces F.I.T.T principle from ACSM for exercise prescriptions.  Reviews F.I.T.T. principles of aerobic exercise including progression. Written material given at graduation. Flowsheet Row Cardiac Rehab from 02/16/2018 in [facility] Cardiac and Pulmonary Rehab  Date 10/18/17  Educator [facility]  Instruction Review Code 1- [employer] Understanding      Education: Resistance Exercise: - Group verbal and visual presentation on the components of exercise prescription. Introduces F.I.T.T principle from ACSM for exercise prescriptions  Reviews F.I.T.T. principles of resistance exercise including progression. Written material given at graduation.    Education: Exercise & Equipment Safety: - Individual verbal instruction and demonstration of equipment use and safety with use of the equipment. Flowsheet Row Pulmonary Rehab from 01/09/2020 in [facility] Cardiac and Pulmonary Rehab  Date 01/08/20   Educator [facility]  Instruction Review Code 1- Verbalizes Understanding      Education: Exercise Physiology & General Exercise Guidelines: - Group verbal and written instruction with models to review the exercise physiology of the cardiovascular system and associated critical values. Provides general exercise guidelines with specific guidelines to those with heart or lung disease.  Flowsheet Row Pulmonary Rehab from 01/09/2020 in [facility] Cardiac and Pulmonary Rehab  Education need identified 01/09/20      Education: Flexibility, Balance, Mind/Body Relaxation: - Group verbal and visual presentation with interactive activity on the components of exercise prescription. Introduces F.I.T.T principle from ACSM for exercise prescriptions. Reviews F.I.T.T. principles of flexibility and balance exercise training including progression. Also discusses the mind body connection.  Reviews various relaxation techniques to help reduce and manage stress (i.e. Deep breathing, progressive muscle relaxation, and visualization). Balance handout provided to take home. Written material given at graduation. Flowsheet Row Cardiac Rehab from 02/16/2018 in [facility] Cardiac and Pulmonary Rehab  Date 02/07/18  Educator [facility]  Instruction Review Code 1- Verbalizes Understanding      Activity Barriers & Risk Stratification:   6 Minute Walk:  Oxygen Initial Assessment:   Oxygen Re-Evaluation:   Oxygen Discharge (Final Oxygen Re-Evaluation):   Initial Exercise Prescription:   Perform Capillary Blood Glucose checks as needed.  Exercise Prescription Changes:   Exercise Comments:   Exercise Goals and Review:   Exercise Goals Re-Evaluation :   Discharge Exercise Prescription (Final Exercise Prescription Changes):   Nutrition:  Target Goals: Understanding of nutrition guidelines, daily intake of sodium '1500mg'$ , cholesterol '200mg'$ , calories 30% from fat and 7% or less from saturated fats, daily to have 5 or more  servings of fruits and vegetables.  Education: All About Nutrition: -Group instruction provided by verbal, written material, interactive activities, discussions, models, and posters to present general guidelines for heart healthy nutrition including fat, fiber, MyPlate, the role of sodium in heart healthy nutrition, utilization of the nutrition label, and utilization of this knowledge for meal planning. Follow up email sent as well. Written material given at graduation.   Biometrics:    Nutrition Therapy Plan and Nutrition Goals:   Nutrition Assessments:  MEDIFICTS Score Key:  ?70 Need to make dietary changes   40-70 Heart Healthy Diet  ? 40 Therapeutic Level  Cholesterol Diet   Picture Your Plate Scores:  <60 Unhealthy dietary pattern with much room for improvement.  41-50 Dietary pattern unlikely to meet recommendations for good health and room for improvement.  51-60 More healthful dietary pattern, with some room for improvement.   >60 Healthy dietary pattern, although there may be some specific behaviors that could be improved.   Nutrition Goals Re-Evaluation:   Nutrition Goals Discharge (Final Nutrition Goals Re-Evaluation):   Psychosocial: Target Goals: Acknowledge presence or absence of significant depression and/or stress, maximize coping skills, provide positive support system. Participant is able to verbalize types and ability to use techniques and skills needed for reducing stress and depression.   Education: Stress, Anxiety, and Depression - Group verbal and visual presentation to define topics covered.  Reviews how body is impacted by stress, anxiety, and depression.  Also discusses healthy ways to reduce stress and to treat/manage anxiety and depression.  Written material given at graduation.   Education: Sleep Hygiene -Provides group verbal and written instruction about how sleep can affect your health.  Define sleep hygiene, discuss sleep cycles and  impact of sleep habits. Review good sleep hygiene tips.  Flowsheet Row Cardiac Rehab from 02/16/2018 in [facility] Cardiac and Pulmonary Rehab  Date 10/06/17  Educator Lucianne Lei, MSW  Instruction Review Code 1- Verbalizes Understanding      Initial Review & Psychosocial Screening:   Quality of Life Scores:  Scores of 19 and below usually indicate a poorer quality of life in these areas.  A difference of  2-3 points is a clinically meaningful difference.  A difference of 2-3 points in the total score of the Quality of Life Index has been associated with significant improvement in overall quality of life, self-image, physical symptoms, and general health in studies assessing change in quality of life.  PHQ-9: Recent Review Flowsheet Data    Depression screen [facility] 2/9 01/09/2020 02/03/2018 10/04/2017   Decreased Interest 2 1 1    Down, Depressed, Hopeless 2 1 1    PHQ - 2 Score 4 2 2    Altered sleeping 3 1 2    Tired, decreased energy 3 2 3    Change in appetite 1 2 1    Feeling bad or failure about yourself  2 1 1    Trouble concentrating 1 0 0   Moving slowly or fidgety/restless 0 0 0   Suicidal thoughts 1  0 0   PHQ-9 Score 15 8 9    Difficult doing work/chores Somewhat difficult Somewhat difficult Somewhat difficult     Interpretation of Total Score  Total Score Depression Severity:  1-4 = Minimal depression, 5-9 = Mild depression, 10-14 = Moderate depression, 15-19 = Moderately severe depression, 20-27 = Severe depression   Psychosocial Evaluation and Intervention:   Psychosocial Re-Evaluation:   Psychosocial Discharge (Final Psychosocial Re-Evaluation):   Education: Education Goals: Education classes will be provided on a weekly basis, covering required topics. Participant will state understanding/return demonstration of topics presented.  Learning Barriers/Preferences:   General Pulmonary Education Topics:  Infection Prevention: - Provides verbal and written material to  individual with discussion of infection control including proper hand washing and proper equipment cleaning during exercise session. Flowsheet Row Pulmonary Rehab from 01/09/2020 in [facility] Cardiac and Pulmonary Rehab  Date 01/08/20  Educator [facility]  Instruction Review Code 1- Verbalizes Understanding      Falls Prevention: - Provides verbal and written material to individual with discussion of falls prevention and safety. Flowsheet Row Pulmonary Rehab from 01/09/2020 in [facility] Cardiac and  Pulmonary Rehab  Date 01/08/20  Educator [facility]  Instruction Review Code 1- Verbalizes Understanding      Chronic Lung Disease Review: - Group verbal instruction with posters, models, PowerPoint presentations and videos,  to review new updates, new respiratory medications, new advancements in procedures and treatments. Providing information on websites and "800" numbers for continued self-education. Includes information about supplement oxygen, available portable oxygen systems, continuous and intermittent flow rates, oxygen safety, concentrators, and Medicare reimbursement for oxygen. Explanation of Pulmonary Drugs, including class, frequency, complications, importance of spacers, rinsing mouth after steroid MDI's, and proper cleaning methods for nebulizers. Review of basic lung anatomy and physiology related to function, structure, and complications of lung disease. Review of risk factors. Discussion about methods for diagnosing sleep apnea and types of masks and machines for OSA. Includes a review of the use of types of environmental controls: home humidity, furnaces, filters, dust mite/pet prevention, HEPA vacuums. Discussion about weather changes, air quality and the benefits of nasal washing. Instruction on Warning signs, infection symptoms, calling MD promptly, preventive modes, and value of vaccinations. Review of effective airway clearance, coughing and/or vibration techniques. Emphasizing that all should Create an  Action Plan. Written material given at graduation. Flowsheet Row Pulmonary Rehab from 01/09/2020 in [facility] Cardiac and Pulmonary Rehab  Education need identified 01/09/20      AED/CPR: - Group verbal and written instruction with the use of models to demonstrate the basic use of the AED with the basic ABC's of resuscitation.    Anatomy and Cardiac Procedures: - Group verbal and visual presentation and models provide information about basic cardiac anatomy and function. Reviews the testing methods done to diagnose heart disease and the outcomes of the test results. Describes the treatment choices: Medical Management, Angioplasty, or Coronary Bypass Surgery for treating various heart conditions including Myocardial Infarction, Angina, Valve Disease, and Cardiac Arrhythmias.  Written material given at graduation.   Medication Safety: - Group verbal and visual instruction to review commonly prescribed medications for heart and lung disease. Reviews the medication, class of the drug, and side effects. Includes the steps to properly store meds and maintain the prescription regimen.  Written material given at graduation.   Other: -Provides group and verbal instruction on various topics (see comments)   Knowledge Questionnaire Score:    Core Components/Risk Factors/Patient Goals at Admission:   Education:Diabetes - Individual verbal and written instruction to review signs/symptoms of diabetes, desired ranges of glucose level fasting, after meals and with exercise. Acknowledge that pre and post exercise glucose checks will be done for 3 sessions at entry of program. Flowsheet Row Pulmonary Rehab from 01/09/2020 in [facility] Cardiac and Pulmonary Rehab  Date 01/08/20  Educator [facility]  Instruction Review Code 1- Verbalizes Understanding      Know Your Numbers and Heart Failure: - Group verbal and visual instruction to discuss disease risk factors for cardiac and pulmonary disease and treatment  options.  Reviews associated critical values for Overweight/Obesity, Hypertension, Cholesterol, and Diabetes.  Discusses basics of heart failure: signs/symptoms and treatments.  Introduces Heart Failure Zone chart for action plan for heart failure.  Written material given at graduation.   Core Components/Risk Factors/Patient Goals Review:    Core Components/Risk Factors/Patient Goals at Discharge (Final Review):    ITP Comments:  ITP Comments    Row Name 02/14/20 0955 02/21/20 1003 02/28/20 1526 03/13/20 0602 03/14/20 0759   ITP Comments 30 Day review completed. Medical Director ITP review done, changes made as directed, and signed approval by  Medical Director. Remains out for medical reasons Called to check up on patient.  Out since recent admission last month. Called to check up on patient.  Out since orientation - last heard from him 01/17/20. Sent letter. 30 Day review completed. Medical Director ITP review done, changes made as directed, and signed approval by Medical Director. Patient is being discharged for lack of attendance.   [place] Name 04/10/20 0924           ITP Comments 30 Day review completed. Medical Director ITP review done, changes made as directed, and signed approval by Medical Director.              Comments:

## 2020-07-13 ENCOUNTER — Inpatient Hospital Stay
Admission: EM | Admit: 2020-07-13 | Discharge: 2020-07-20 | DRG: 291 | Disposition: A | Discharge status: Home Health | Payer: Medicare Other | Attending: Internal Medicine | Admitting: Internal Medicine

## 2020-07-13 ENCOUNTER — Other Ambulatory Visit: Payer: Self-pay

## 2020-07-13 ENCOUNTER — Emergency Department: Payer: Medicare Other

## 2020-07-13 DIAGNOSIS — R0602 Shortness of breath: Secondary | ICD-10-CM

## 2020-07-13 DIAGNOSIS — Z7982 Long term (current) use of aspirin: Secondary | ICD-10-CM

## 2020-07-13 DIAGNOSIS — E785 Hyperlipidemia, unspecified: Secondary | ICD-10-CM | POA: Diagnosis present

## 2020-07-13 DIAGNOSIS — F17210 Nicotine dependence, cigarettes, uncomplicated: Secondary | ICD-10-CM | POA: Diagnosis present

## 2020-07-13 DIAGNOSIS — Z720 Tobacco use: Secondary | ICD-10-CM

## 2020-07-13 DIAGNOSIS — Z8249 Family history of ischemic heart disease and other diseases of the circulatory system: Secondary | ICD-10-CM

## 2020-07-13 DIAGNOSIS — Z79899 Other long term (current) drug therapy: Secondary | ICD-10-CM

## 2020-07-13 DIAGNOSIS — Z86718 Personal history of other venous thrombosis and embolism: Secondary | ICD-10-CM

## 2020-07-13 DIAGNOSIS — I5032 Chronic diastolic (congestive) heart failure: Secondary | ICD-10-CM | POA: Diagnosis not present

## 2020-07-13 DIAGNOSIS — I5033 Acute on chronic diastolic (congestive) heart failure: Secondary | ICD-10-CM | POA: Diagnosis present

## 2020-07-13 DIAGNOSIS — Z7901 Long term (current) use of anticoagulants: Secondary | ICD-10-CM

## 2020-07-13 DIAGNOSIS — J9621 Acute and chronic respiratory failure with hypoxia: Secondary | ICD-10-CM | POA: Diagnosis present

## 2020-07-13 DIAGNOSIS — E119 Type 2 diabetes mellitus without complications: Secondary | ICD-10-CM | POA: Diagnosis present

## 2020-07-13 DIAGNOSIS — Z955 Presence of coronary angioplasty implant and graft: Secondary | ICD-10-CM

## 2020-07-13 DIAGNOSIS — J9622 Acute and chronic respiratory failure with hypercapnia: Secondary | ICD-10-CM | POA: Diagnosis present

## 2020-07-13 DIAGNOSIS — Z6838 Body mass index (BMI) 38.0-38.9, adult: Secondary | ICD-10-CM

## 2020-07-13 DIAGNOSIS — G4733 Obstructive sleep apnea (adult) (pediatric): Secondary | ICD-10-CM | POA: Diagnosis present

## 2020-07-13 DIAGNOSIS — Z86711 Personal history of pulmonary embolism: Secondary | ICD-10-CM | POA: Diagnosis not present

## 2020-07-13 DIAGNOSIS — I2510 Atherosclerotic heart disease of native coronary artery without angina pectoris: Secondary | ICD-10-CM | POA: Diagnosis present

## 2020-07-13 DIAGNOSIS — K219 Gastro-esophageal reflux disease without esophagitis: Secondary | ICD-10-CM | POA: Diagnosis present

## 2020-07-13 DIAGNOSIS — Z66 Do not resuscitate: Secondary | ICD-10-CM | POA: Diagnosis present

## 2020-07-13 DIAGNOSIS — R9431 Abnormal electrocardiogram [ECG] [EKG]: Secondary | ICD-10-CM

## 2020-07-13 DIAGNOSIS — I4510 Unspecified right bundle-branch block: Secondary | ICD-10-CM | POA: Diagnosis present

## 2020-07-13 DIAGNOSIS — I110 Hypertensive heart disease with heart failure: Secondary | ICD-10-CM | POA: Diagnosis not present

## 2020-07-13 DIAGNOSIS — J441 Chronic obstructive pulmonary disease with (acute) exacerbation: Secondary | ICD-10-CM | POA: Diagnosis present

## 2020-07-13 DIAGNOSIS — Z8701 Personal history of pneumonia (recurrent): Secondary | ICD-10-CM

## 2020-07-13 DIAGNOSIS — E782 Mixed hyperlipidemia: Secondary | ICD-10-CM | POA: Diagnosis not present

## 2020-07-13 DIAGNOSIS — Z888 Allergy status to other drugs, medicaments and biological substances status: Secondary | ICD-10-CM

## 2020-07-13 DIAGNOSIS — Z9981 Dependence on supplemental oxygen: Secondary | ICD-10-CM

## 2020-07-13 DIAGNOSIS — I10 Essential (primary) hypertension: Secondary | ICD-10-CM | POA: Diagnosis not present

## 2020-07-13 DIAGNOSIS — Z951 Presence of aortocoronary bypass graft: Secondary | ICD-10-CM

## 2020-07-13 DIAGNOSIS — G4730 Sleep apnea, unspecified: Secondary | ICD-10-CM | POA: Diagnosis present

## 2020-07-13 DIAGNOSIS — D6861 Antiphospholipid syndrome: Secondary | ICD-10-CM | POA: Diagnosis present

## 2020-07-13 DIAGNOSIS — Z20822 Contact with and (suspected) exposure to covid-19: Secondary | ICD-10-CM | POA: Diagnosis present

## 2020-07-13 DIAGNOSIS — G4731 Primary central sleep apnea: Secondary | ICD-10-CM

## 2020-07-13 LAB — PROTIME-INR
INR: 2.2 — ABNORMAL HIGH (ref 0.8–1.2)
Prothrombin Time: 24.8 seconds — ABNORMAL HIGH (ref 11.4–15.2)

## 2020-07-13 LAB — BLOOD GAS, VENOUS
Acid-Base Excess: 5.7 mmol/L — ABNORMAL HIGH (ref 0.0–2.0)
Bicarbonate: 35.2 mmol/L — ABNORMAL HIGH (ref 20.0–28.0)
O2 Saturation: 73 %
Patient temperature: 37
pCO2, Ven: 75 mmHg (ref 44.0–60.0)
pH, Ven: 7.28 (ref 7.250–7.430)
pO2, Ven: 44 mmHg (ref 32.0–45.0)

## 2020-07-13 LAB — CBC WITH DIFFERENTIAL/PLATELET
Abs Immature Granulocytes: 0.03 10*3/uL (ref 0.00–0.07)
Basophils Absolute: 0.1 10*3/uL (ref 0.0–0.1)
Basophils Relative: 0 %
Eosinophils Absolute: 0.1 10*3/uL (ref 0.0–0.5)
Eosinophils Relative: 0 %
HCT: 42.3 % (ref 39.0–52.0)
Hemoglobin: 13.3 g/dL (ref 13.0–17.0)
Immature Granulocytes: 0 %
Lymphocytes Relative: 34 %
Lymphs Abs: 3.9 10*3/uL (ref 0.7–4.0)
MCH: 29.1 pg (ref 26.0–34.0)
MCHC: 31.4 g/dL (ref 30.0–36.0)
MCV: 92.6 fL (ref 80.0–100.0)
Monocytes Absolute: 1.3 10*3/uL — ABNORMAL HIGH (ref 0.1–1.0)
Monocytes Relative: 12 %
Neutro Abs: 6.1 10*3/uL (ref 1.7–7.7)
Neutrophils Relative %: 54 %
Platelets: 230 10*3/uL (ref 150–400)
RBC: 4.57 MIL/uL (ref 4.22–5.81)
RDW: 15.5 % (ref 11.5–15.5)
WBC: 11.4 10*3/uL — ABNORMAL HIGH (ref 4.0–10.5)
nRBC: 0 % (ref 0.0–0.2)

## 2020-07-13 LAB — COMPREHENSIVE METABOLIC PANEL
ALT: 36 U/L (ref 0–44)
AST: 38 U/L (ref 15–41)
Albumin: 3.6 g/dL (ref 3.5–5.0)
Alkaline Phosphatase: 68 U/L (ref 38–126)
Anion gap: 7 (ref 5–15)
BUN: 17 mg/dL (ref 8–23)
CO2: 28 mmol/L (ref 22–32)
Calcium: 8.4 mg/dL — ABNORMAL LOW (ref 8.9–10.3)
Chloride: 100 mmol/L (ref 98–111)
Creatinine, Ser: 0.73 mg/dL (ref 0.61–1.24)
GFR, Estimated: >60 mL/min (ref >60)
Glucose, Bld: 124 mg/dL — ABNORMAL HIGH (ref 70–99)
Potassium: 4.4 mmol/L (ref 3.5–5.1)
Sodium: 135 mmol/L (ref 135–145)
Total Bilirubin: 0.6 mg/dL (ref 0.3–1.2)
Total Protein: 7.8 g/dL (ref 6.5–8.1)

## 2020-07-13 LAB — RESP PANEL BY RT-PCR (FLU A&B, COVID) ARPGX2
Influenza A by PCR: NEGATIVE
Influenza B by PCR: NEGATIVE
SARS Coronavirus 2 by RT PCR: NEGATIVE

## 2020-07-13 LAB — LACTIC ACID, PLASMA: Lactic Acid, Venous: 0.9 mmol/L (ref 0.5–1.9)

## 2020-07-13 LAB — PROCALCITONIN: Procalcitonin: <0.1 ng/mL

## 2020-07-13 LAB — TROPONIN I (HIGH SENSITIVITY): Troponin I (High Sensitivity): 7 ng/L (ref <18)

## 2020-07-13 LAB — APTT: aPTT: 70 seconds — ABNORMAL HIGH (ref 24–36)

## 2020-07-13 LAB — CBG MONITORING, ED: Glucose-Capillary: 175 mg/dL — ABNORMAL HIGH (ref 70–99)

## 2020-07-13 LAB — MAGNESIUM: Magnesium: 2 mg/dL (ref 1.7–2.4)

## 2020-07-13 MED ORDER — NICOTINE 21 MG/24HR TD PT24
21.0000 mg | MEDICATED_PATCH | Freq: Every day | TRANSDERMAL | Status: DC
  Administered 2020-07-13 – 2020-07-19 (×7): 21 mg via TRANSDERMAL
  Filled 2020-07-13 (×7): qty 1

## 2020-07-13 MED ORDER — IOHEXOL 350 MG/ML SOLN
75.0000 mL | Freq: Once | INTRAVENOUS | Status: AC | PRN
  Administered 2020-07-13: 75 mL via INTRAVENOUS

## 2020-07-13 MED ORDER — WARFARIN SODIUM 6 MG PO TABS
6.0000 mg | ORAL_TABLET | Freq: Once | ORAL | Status: AC
  Administered 2020-07-13: 6 mg via ORAL
  Filled 2020-07-13: qty 1

## 2020-07-13 MED ORDER — TORSEMIDE 20 MG PO TABS
20.0000 mg | ORAL_TABLET | Freq: Every day | ORAL | Status: DC
  Administered 2020-07-14 – 2020-07-19 (×6): 20 mg via ORAL
  Filled 2020-07-13 (×8): qty 1

## 2020-07-13 MED ORDER — INSULIN ASPART 100 UNIT/ML IJ SOLN
0.0000 [IU] | INTRAMUSCULAR | Status: DC
  Administered 2020-07-13 – 2020-07-14 (×2): 2 [IU] via SUBCUTANEOUS
  Administered 2020-07-14: 5 [IU] via SUBCUTANEOUS
  Administered 2020-07-14: 3 [IU] via SUBCUTANEOUS
  Administered 2020-07-14: 2 [IU] via SUBCUTANEOUS
  Administered 2020-07-14: 3 [IU] via SUBCUTANEOUS
  Administered 2020-07-14: 2 [IU] via SUBCUTANEOUS
  Administered 2020-07-15: 3 [IU] via SUBCUTANEOUS
  Administered 2020-07-15: 5 [IU] via SUBCUTANEOUS
  Administered 2020-07-15 (×2): 3 [IU] via SUBCUTANEOUS
  Administered 2020-07-15: 5 [IU] via SUBCUTANEOUS
  Administered 2020-07-15: 2 [IU] via SUBCUTANEOUS
  Administered 2020-07-15: 3 [IU] via SUBCUTANEOUS
  Administered 2020-07-16: 9 [IU] via SUBCUTANEOUS
  Administered 2020-07-16 (×2): 5 [IU] via SUBCUTANEOUS
  Administered 2020-07-16: 1 [IU] via SUBCUTANEOUS
  Administered 2020-07-16: 2 [IU] via SUBCUTANEOUS
  Administered 2020-07-17 (×3): 1 [IU] via SUBCUTANEOUS
  Filled 2020-07-13 (×19): qty 1

## 2020-07-13 MED ORDER — WARFARIN - PHARMACIST DOSING INPATIENT
Freq: Every day | Status: DC
  Filled 2020-07-13: qty 1

## 2020-07-13 NOTE — ED Notes (Signed)
Informed Eileen Stanford, RN of bed assignment

## 2020-07-13 NOTE — Progress Notes (Signed)
ANTICOAGULATION CONSULT NOTE - Initial Consult  Pharmacy Consult for Warfarin  Indication: continuation of therapy for VTE  Allergies  Allergen Reactions  . Melatonin Anxiety  . Ramipril     cough    Patient Measurements: Height: 5\' 7"  (170.2 cm) Weight: 112.2 kg (247 lb 5.7 oz) IBW/kg (Calculated) : 66.1 Heparin Dosing Weight:    Vital Signs: Temp: 98 F (36.7 C) (04/30 1627) Temp Source: Oral (04/30 1627) BP: 123/64 (04/30 1945) Pulse Rate: 86 (04/30 1945)  Labs: Recent Labs    07/13/20 1632  HGB 13.3  HCT 42.3  PLT 230  APTT 70*  LABPROT 24.8*  INR 2.2*  CREATININE 0.73  TROPONINIHS 7    Estimated Creatinine Clearance: 110 mL/min (by C-G formula based on SCr of 0.73 mg/dL).   Medical History: Past Medical History:  Diagnosis Date  . Antiphospholipid antibody syndrome ([facility])   . CAD S/P percutaneous coronary angioplasty '98, '04, Feb 2017   [facility]  . Carotid arterial disease ([facility]) 10/2015   moderate bilateral  . COPD (chronic obstructive pulmonary disease) ([facility])   . Coronary artery disease   . Hypertension   . Non-insulin treated type 2 diabetes mellitus ([facility])   . Pulmonary emboli ([facility]) 2011  . RBBB   . Sleep apnea    Assessment: Patient is a 65yo male admitted with shortness of breath. 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   Home Dose: 7mg  on Monday and Friday                       6mg  all other days  INR on Admission: 2.2   Goal of Therapy:  INR 2-3   Plan:  Patient is therapeutic on home regimen. Will order Warfarin 6mg  dose for this evening. Will follow up on AM INR results to determine future dosing.  Thursday, PharmD, BCPS 07/13/2020 10:02 PM

## 2020-07-13 NOTE — ED Provider Notes (Signed)
[facility] Emergency Department Provider Note  Time seen: 5:10 PM  I have reviewed the triage vital signs and the nursing notes.   HISTORY  Chief Complaint Shortness of Breath   HPI Jose [name] is a 65 y.o. male with a past medical history of CAD, COPD, hypertension, past PE, on 2 L O2 at night only presents to the emergency department for worsening shortness of breath  cough and congestion.  According to the patient for the past week to 10 days he has been coughing has been increasingly short of breath and has noted low-grade fever.  States his wife is currently sick at home as well.  Patient states he does not typically wear any oxygen during the day, currently has a 70% room air saturation in the emergency department, satting well on 3 L nasal cannula.  EMS transported the patient to the emergency department and gave the patient 125 mg Solu-Medrol as well as breathing treatments.  Patient states he did receive 1 COVID vaccination but did not go back for the second vaccination and has been quite sometime.  Has not had COVID.  Past Medical History:  Diagnosis Date  . Antiphospholipid antibody syndrome ([place])   . CAD S/P percutaneous coronary angioplasty '98, '04, Feb 2017   [facility]  . Carotid arterial disease ([place]) 10/2015   moderate bilateral  . COPD (chronic obstructive pulmonary disease) ([place])   . Coronary artery disease   . Hypertension   . Non-insulin treated type 2 diabetes mellitus ([place])   . Pulmonary emboli ([place]) 2011  . RBBB   . Sleep apnea     Patient Active Problem List   Diagnosis Date Noted  . Community acquired pneumonia 01/18/2020  . Coronary atherosclerosis 10/04/2017  . GERD (gastroesophageal reflux disease) 10/04/2017  . Hypertension 10/04/2017  . Transient visual loss 10/04/2017  . 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 Nonrheumatic aortic valve stenosis 03/31/2017  . Pericarditis, Acute 02/12/2017  . Acute on chronic right heart failure ([place])  02/11/2017  . Angina at rest [facility]) 08/17/2016  . Unstable angina ([place]) 08/16/2016  . Tobacco abuse 08/16/2016  . 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 Hyperlipidemia 08/16/2016  . Essential hypertension, benign 08/16/2016  . COPD (chronic obstructive pulmonary disease) ([place]) 08/16/2016  . Sleep apnea 08/16/2016  . CAD S/P multiple PCIs 08/16/2016  . History of pulmonary embolus (PE)-2011 08/16/2016  . Chronic anticoagulation-Cou[place]din 08/16/2016  . Non-insulin treated type 2 diabetes mellitus ([place]) 08/16/2016  . Carotid artery disease ([place]) 08/16/2016  . RBBB (right bundle branch block) 08/16/2016  . Dyspnea on exertion 10/17/2015  . ED (erectile dysfunction) 08/09/2012    Past Surgical History:  Procedure Laterality Date  . APPENDECTOMY    . APPLICATION OF WOUND VAC Left 03/18/2019   Procedure: Application Of Wound Vac;  Surgeon: Newt Minion, MD;  Location: [place];  Service: Orthopedics;  Laterality: Left;  . BACK SURGERY  '89, '06  . COLONOSCOPY    . CORONARY ANGIOPLASTY WITH STENT PLACEMENT  '98, '04, Feb 2017  . I & D EXTREMITY Left 03/15/2019   Procedure: DEBRIDEMENT LEFT THIGH;  Surgeon: Newt Minion, MD;  Location: [facility];  Service: Orthopedics;  Laterality: Left;  . INCISION AND DRAINAGE OF WOUND Left 03/18/2019   Procedure: LEFT THIGH DEBRIDEMENT;  Surgeon: Newt Minion, MD;  Location: [facility];  Service: Orthopedics;  Laterality: Left;    Prior to Admission medications   Medication Sig Start Date End Date Taking? Authorizing  Provider  albuterol (PROVENTIL) (5 MG/ML) 0.5% nebulizer solution Take 0.5 mLs (2.5 mg total) by nebulization every 6 (six) hours as needed for wheezing or shortness of breath. 12/27/18   Wyvonnia Dusky, MD  albuterol (VENTOLIN HFA) 108 (90 Base) MCG/ACT inhaler Inhale 1 puff into the lungs every 6 (six) hours as needed for wheezing or shortness of breath.    [provider]  aspirin EC 81 MG EC tablet Take 1 tablet (81 mg total)  by mouth daily. Swallow whole. 01/23/20   Dagar, Meredith Staggers, MD  Blood Glucose Monitoring Suppl (CONTOUR NEXT MONITOR) w/Device KIT by Does not apply route. 10/16/19   [provider]  Blood Glucose Monitoring Suppl ([place]) [place] 1 each by XX route as directed (For Blood Glucose Monitoring) 08/23/17   [provider]  DULoxetine (CYMBALTA) 60 MG capsule Take 60 mg by mouth once a week.  05/03/19   [provider]  enoxaparin (LOVENOX) 30 MG/0.3ML injection Inject 1.1 mLs (110 mg total) into the skin 2 (two) times daily for 2 days. 01/22/20 01/24/20  Dagar, Meredith Staggers, MD  Fluticasone Propionate, Inhal, (FLOVENT DISKUS) 100 MCG/BLIST AEPB Inhale 1 puff into the lungs daily as needed (wheezing & shortness of breath).    [provider]  gabapentin (NEURONTIN) 600 MG tablet Take 600 mg by mouth 3 (three) times daily.    [provider]  glucose blood (CONTOUR NEXT TEST) test strip Use to check blood sugar twice 2 time(s) daily. 10/16/19 10/15/20  [provider]  HYDROcodone-acetaminophen (NORCO) 10-325 MG tablet Take 1 tablet by mouth 2 (two) times daily.     [provider]  ipratropium-albuterol (DUONEB) 0.5-2.5 (3) MG/3ML SOLN Inhale 3 mLs into the lungs at bedtime.  12/27/19 12/21/20  [provider]  Lancets (ACCU-CHEK SOFT TOUCH) lancets 2 (two) times daily. 10/16/19 10/15/20  [provider]  metoprolol tartrate (LOPRESSOR) 12.5 mg TABS tablet Take 12.5 mg by mouth 2 (two) times daily.  Patient not taking: Reported on 01/18/2020 08/22/17 03/14/19  [provider]  nicotine (NICODERM CQ - DOSED IN MG/24 HOURS) 14 mg/24hr patch Apply one patch daily x 2 weeks.  Begin after completion of 21 mg patches 10/06/19   [provider]  nitroGLYCERIN (NITROSTAT) 0.4 MG SL tablet Place 0.4 mg under the tongue every 5 (five) minutes as needed for chest pain.  07/07/16   [provider]  potassium chloride  (KLOR-CON) 20 MEQ packet Take 40 mEq by mouth 2 (two) times daily. 01/22/20   Dagar, Meredith Staggers, MD  PROAIR HFA 108 (90 Base) MCG/ACT inhaler Inhale 1-2 puffs into the lungs every 4 (four) hours as needed for wheezing.  07/07/16   [provider]  rosuvastatin (CRESTOR) 10 MG tablet Take 10 mg by mouth every evening. 07/07/16   [provider]  tamsulosin (FLOMAX) 0.4 MG CAPS capsule Take 0.4 mg by mouth daily. 06/05/16   [provider]  Tiotropium Bromide-Olodaterol (STIOLTO RESPIMAT) 2.5-2.5 MCG/ACT AERS Inhale 2 puffs into the lungs daily.  09/10/17   [provider]  tiZANidine (ZANAFLEX) 4 MG tablet Take 8 mg by mouth in the morning and at bedtime.     [provider]  topiramate (TOPAMAX) 50 MG tablet Take 50 mg by mouth 2 (two) times daily. 06/05/16   [provider]  warfarin (COUMADIN) 6 MG tablet Take 1 tablet (6 mg total) by mouth daily for 2 days. 01/22/20 01/24/20  Dagar, Meredith Staggers, MD  warfarin (COUMADIN)  7.5 MG tablet Take 1 tablet (7.5 mg total) by mouth at bedtime for 1 dose. 01/22/20 01/23/20  Dagar, Meredith Staggers, MD    Allergies  Allergen Reactions  . Melatonin Anxiety  . Ramipril     cough    Family History  Problem Relation Age of Onset  . CAD Father 54    Social History Social History   Tobacco Use  . Smoking status: Current Every Day Smoker    Packs/day: 0.50    Years: 40.00    Pack years: 20.00    Types: Cigarettes    Last attempt to quit: 01/15/2019    Years since quitting: 1.4  . Smokeless tobacco: Never Used  . Tobacco comment: has started smoking again, willing to quit.  Vaping Use  . Vaping Use: Never used  Substance Use Topics  . Alcohol use: No  . Drug use: No    Review of Systems Constitutional: Low-grade fever at home. Cardiovascular: Negative for chest pain. Respiratory: Positive for shortness of breath, worse with exertion.  Positive for cough. Gastrointestinal: Negative for abdominal pain,  vomiting Musculoskeletal: Negative for musculoskeletal complaints Neurological: Negative for headache All other ROS negative  ____________________________________________   PHYSICAL EXAM:  VITAL SIGNS: ED Triage Vitals  Enc Vitals Group     BP 07/13/20 1627 (!) 155/91     Pulse Rate 07/13/20 1627 92     Resp 07/13/20 1627 15     Temp 07/13/20 1627 98 F (36.7 C)     Temp Source 07/13/20 1627 Oral     SpO2 07/13/20 1621 (!) 70 %     Weight 07/13/20 1629 247 lb 5.7 oz (112.2 kg)     Height 07/13/20 1629 _0  (1.702 m)     Head Circumference --      Peak Flow --      Pain Score 07/13/20 1629 0     Pain Loc --      Pain Edu? --      Excl. in [place]? --     Constitutional: Alert and oriented. Well appearing and in no distress. Eyes: Normal exam ENT      Head: Normocephalic and atraumatic.      Mouth/Throat: Mucous membranes are moist. Cardiovascular: Normal rate, regular rhythm.  Respiratory: Mild tachypnea.  Patient has bilateral wheeze and left-sided rhonchi. Gastrointestinal: Soft and nontender. No distention. Musculoskeletal: Nontender with normal range of motion in all extremities. Neurologic:  Normal speech and language. No gross focal neurologic deficits Skin:  Skin is warm, dry and intact.  Psychiatric: Mood and affect are normal.   ____________________________________________    EKG  EKG viewed and interpreted by myself shows a normal sinus rhythm 87 bpm with a widened QRS, normal axis, slight QTC prolongation, nonspecific ST changes but no ST elevation.  ____________________________________________    RADIOLOGY  Chest x-ray is shows cardiomegaly with mild pulmonary vascular congestion. CTA negative for acute abnormality ____________________________________________   INITIAL IMPRESSION / ASSESSMENT AND PLAN / ED COURSE  Pertinent labs & imaging results that were available during my care of the patient were reviewed by me and considered in my medical  decision making (see chart for details).   Patient presents to the emergency department for cough congestion shortness of breath found to be hypoxic to 70% on room air.  At baseline does not wear O2 during the daytime.  Differential is quite broad in this patient but would include infectious etiologies such as COVID-19 given his wife is ill with similar  symptoms as well, COPD exacerbation, pneumonia, CHF exacerbation.  Reassuringly afebrile in the emergency department.  Maintaining sats in the mid 90s on 3 L nasal cannula.  Patient's work-up is reassuring including negative COVID/flu.  Chest x-ray shows mild vascular congestion.  Given the patient's history of PE and hypoxia on room air CTA was ordered which results negative as well.  Given the patient's wheeze on exam with hypoxia on room air we will admit to the hospital service for likely COPD exacerbation.  Patient agreeable to plan of care.  Nellie Chevalier was evaluated in Emergency Department on 07/13/2020 for the symptoms described in the history of present illness. He was evaluated in the context of the global COVID-19 pandemic, which necessitated consideration that the patient might be at risk for infection with the SARS-CoV-2 virus that causes COVID-19. Institutional protocols and algorithms that pertain to the evaluation of patients at risk for COVID-19 are in a state of rapid change based on information released by regulatory bodies including the CDC and federal and state organizations. These policies and algorithms were followed during the patient's care in the ED.  ____________________________________________   FINAL CLINICAL IMPRESSION(S) / ED DIAGNOSES  COPD exacerbation Dyspnea   Harvest Dark, MD 07/13/20 2050

## 2020-07-13 NOTE — H&P (Signed)
Jose [name] GPQ:[id number] DOB: May 18, 1955 DOA: 07/13/2020    PCP: Elenore Paddy, MD   Outpatient Specialists:    Pulmonary   Dr. Ronalee Red    Patient arrived to ER on 07/13/20 at 1616 Referred by Attending Harvest Dark, MD   Patient coming from: home Lives  With family    Chief Complaint:   Chief Complaint  Patient presents with  . Shortness of Breath    HPI: Jose [name] is a 65 y.o. male with medical history significant of COPD, CHF, CAD sp CABG, HTN, PE on coumadin, DM type II, RBBB, OSA on oxygen at night    Presented with   10 days of SOB subjective fever, cough Family member sick at home as well when EMS arrived was satting 76% on room air he was given 125 mg of Solu-Medrol albuterol and DuoNeb nebs At home he uses oxygen only at night and 2 L.  Just started new Diabetes med injection but cannot afford it Has been sing torsemide 20 mg a day supposed to be taking it twice a day but states it is making him urinate and it is hard to get to the bathroom  Continues to smoke Nicotine patches help Infectious risk factors:  Reports fever, shortness of breath, dry cough,    Has  been vaccinated against COVID  x1   Initial COVID TEST  NEGATIVE   Lab Results  Component Value Date   [place] 07/13/2020   [place] NEGATIVE 01/18/2020   [place] NEGATIVE 03/14/2019   [place] NEGATIVE 12/27/2018    Regarding pertinent Chronic problems:     Hyperlipidemia -  on statins Crestor Lipid Panel     Component Value Date/Time   CHOL 156 08/16/2016 0922   TRIG 322 (H) 08/16/2016 0922   HDL 28 (L) 08/16/2016 0922   CHOLHDL 5.6 08/16/2016 0922   VLDL 64 (H) 08/16/2016 0922   LDLCALC 64 08/16/2016 0922     HTN on Lopressor   chronic CHF diastolic  - last echo 0940 Left ventricular diastolic parameters are consistent with Grade I  diastolic dysfunction     CAD  - On Aspirin, statin, betablocker,                  DM 2 -  Lab Results   Component Value Date   HGBA1C 8.7 (H) 01/19/2020        obesity-   BMI Readings from Last 1 Encounters:  07/13/20 38.74 kg/m      COPD -  followed by pulmonology  ,      OSA -on nocturnal oxygen,      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 with  Coumadin        While in ER: Presents with acute on chronic respiratory failure hypoxia Getting better with NEBS Chest x-ray showing possible CHF Started on oxygen and O2 up to mid 90s on 3 L   ED Triage Vitals  Enc Vitals Group     BP 07/13/20 1627 (!) 155/91     Pulse Rate 07/13/20 1627 92     Resp 07/13/20 1627 15     Temp 07/13/20 1627 98 F (36.7 C)     Temp Source 07/13/20 1627 Oral     SpO2 07/13/20 1621 (!) 70 %     Weight 07/13/20 1629 247 lb 5.7 oz (112.2 kg)     Height 07/13/20 1629 _0  (1.702 m)     Head Circumference --  Peak Flow --      Pain Score 07/13/20 1629 0     Pain Loc --      Pain Edu? --      Excl. in [place]? --   TMAX(24)@     _________________________________________ Significant initial  Findings: Abnormal Labs Reviewed  CBC WITH DIFFERENTIAL/PLATELET - Abnormal; Notable for the following components:      Result Value   WBC 11.4 (*)    Monocytes Absolute 1.3 (*)    All other components within normal limits  COMPREHENSIVE METABOLIC PANEL - Abnormal; Notable for the following components:   Glucose, Bld 124 (*)    Calcium 8.4 (*)    All other components within normal limits  PROTIME-INR - Abnormal; Notable for the following components:   Prothrombin Time 24.8 (*)    INR 2.2 (*)    All other components within normal limits  APTT - Abnormal; Notable for the following components:   aPTT 70 (*)    All other components within normal limits   ____________________________________________ Ordered   CXR - CHF?    CTA chest -  nonacute, no PE, no evidence of infiltrate   _________________________ Troponin 7 ECG: Ordered Personally reviewed by me showing: HR  : 87 Rhythm:  NSR,   RBBB,     no evidence of ischemic changes QTC 520  The recent clinical data is shown below. Vitals:   07/13/20 1800 07/13/20 1830 07/13/20 1845 07/13/20 1945  BP: (!) 115/57 122/62 125/65 123/64  Pulse: 83 84 87 86  Resp: (!) 21 (!) 21 (!) 24 (!) 21  Temp:      TempSrc:      SpO2: 96% 96% 96% 96%  Weight:      Height:        WBC     Component Value Date/Time   WBC 11.4 (H) 07/13/2020 1632   LYMPHSABS 3.9 07/13/2020 1632   MONOABS 1.3 (H) 07/13/2020 1632   EOSABS 0.1 07/13/2020 1632   BASOSABS 0.1 07/13/2020 1632    Lactic Acid, Venous    Component Value Date/Time   LATICACIDVEN 0.9 07/13/2020 1632     UA  ordered     Results for orders placed or performed during the hospital encounter of 07/13/20  Resp Panel by RT-PCR (Flu A&B, Covid) Nasopharyngeal Swab     Status: None   Collection Time: 07/13/20  4:32 PM   Specimen: Nasopharyngeal Swab; Nasopharyngeal(NP) swabs in vial transport medium  Result Value Ref Range Status   SARS Coronavirus 2 by RT PCR NEGATIVE NEGATIVE Final         Influenza A by PCR NEGATIVE NEGATIVE Final   Influenza B by PCR NEGATIVE NEGATIVE Final           _______________________________________________ Hospitalist was called for admission for COPD exacerbation  The following Work up has been ordered so far:  Orders Placed This Encounter  Procedures  . Resp Panel by RT-PCR (Flu A&B, Covid) Nasopharyngeal Swab  . Blood culture (routine single)  . Urine culture  . DG Chest Portable 1 View  . CT Angio Chest PE W and/or Wo Contrast  . CBC with Differential  . Comprehensive metabolic panel  . Lactic acid, plasma  . Protime-INR  . APTT  . Urinalysis, Complete w Microscopic  . Diet NPO time specified  . Cardiac monitoring  . Document height and weight  . Assess and Document Glasgow Coma Scale  . Document vital signs within 1-hour of fluid bolus completion.  Notify provider of abnormal vital signs despite fluid  resuscitation.  . Refer to Sidebar Report: Sepsis Bundle ED/IP  . Notify provider for difficulties obtaining IV access  . Initiate Carrier Fluid Protocol  . Consult to hospitalist  . Pulse oximetry, continuous  . ED EKG 12-Lead  . EKG 12-Lead  . Insert peripheral IV X 1    Following Medications were ordered in ER: Medications  iohexol (OMNIPAQUE) 350 MG/ML injection 75 mL (75 mLs Intravenous Contrast Given 07/13/20 1903)        Consult Orders  (From admission, onward)         Start     Ordered   07/13/20 1952  Consult to hospitalist  Once       Provider:  (Not yet assigned)  Question Answer Comment  Place call to: [facility]   Reason for Consult Admit   Diagnosis/Clinical Info for Consult: copd, hypoxia      07/13/20 1951            OTHER Significant initial  Findings:  labs showing:    Recent Labs  Lab 07/13/20 1632  NA 135  K 4.4  CO2 28  GLUCOSE 124*  BUN 17  CREATININE 0.73  CALCIUM 8.4*    Cr    stable,    Lab Results  Component Value Date   CREATININE 0.73 07/13/2020   CREATININE 1.10 01/22/2020   CREATININE 0.67 01/21/2020    Recent Labs  Lab 07/13/20 1632  AST 38  ALT 36  ALKPHOS 68  BILITOT 0.6  PROT 7.8  ALBUMIN 3.6   Lab Results  Component Value Date   CALCIUM 8.4 (L) 07/13/2020      Plt: Lab Results  Component Value Date   PLT 230 07/13/2020      Recent Labs  Lab 07/13/20 1632  WBC 11.4*  NEUTROABS 6.1  HGB 13.3  HCT 42.3  MCV 92.6  PLT 230    HG/HCT   stable,       Component Value Date/Time   HGB 13.3 07/13/2020 1632   HCT 42.3 07/13/2020 1632   MCV 92.6 07/13/2020 1632   BNP (last 3 results) Recent Labs    01/18/20 1435  BNP 139.4*    DM  labs:  HbA1C: Recent Labs    01/19/20 0351  HGBA1C 8.7*       CBG (last 3)  Recent Labs    07/13/20 2058  GLUCAP 175*         Cultures:    Component Value Date/Time   SDES BLOOD RIGHT HAND 01/18/2020 1515   SPECREQUEST  01/18/2020 1515    BOTTLES  DRAWN AEROBIC AND ANAEROBIC Blood Culture results may not be optimal due to an inadequate volume of blood received in culture bottles   CULT  01/18/2020 1515    NO GROWTH 5 DAYS Performed at [facility], [place] [address], [place], [place] [address]    REPTSTATUS 01/23/2020 FINAL 01/18/2020 1515     Radiological Exams on Admission: CT Angio Chest PE W and/or Wo Contrast  Result Date: 07/13/2020 CLINICAL DATA:  Increasing shortness of breath for 10 days, COPD EXAM: CT ANGIOGRAPHY CHEST WITH CONTRAST TECHNIQUE: Multidetector CT imaging of the chest was performed using the standard protocol during bolus administration of intravenous contrast. Multiplanar CT image reconstructions and MIPs were obtained to evaluate the vascular anatomy. CONTRAST:  27m OMNIPAQUE IOHEXOL 350 MG/ML SOLN COMPARISON:  07/13/2020, 12/27/2018 FINDINGS: Cardiovascular: This is a technically adequate evaluation of the  pulmonary vasculature. There are no filling defects or pulmonary emboli. The heart is unremarkable without pericardial effusion. No evidence of thoracic aortic aneurysm or dissection. Postsurgical changes are seen from median sternotomy and coronary artery bypass. Mild atherosclerosis of the aortic arch. Extensive atherosclerosis of the native coronary vessels. Moderate calcification of the aortic valve. Mediastinum/Nodes: No pathologically enlarged mediastinal, hilar, or axillary adenopathy. Thyroid gland, trachea, and esophagus demonstrate no significant findings. Lungs/Pleura: No acute airspace disease, effusion, or pneumothorax. Minimal scarring within the right lower lobe. Central airways are patent. Upper Abdomen: No acute abnormality. Musculoskeletal: No acute or destructive bony lesions. Reconstructed images demonstrate no additional findings. Review of the MIP images confirms the above findings. IMPRESSION: 1. No evidence of pulmonary embolus. 2. No acute intrathoracic process. 3.  Aortic Atherosclerosis  (ICD10-I70.0). Electronically Signed   By: Randa Ngo M.D.   On: 07/13/2020 19:25   DG Chest Portable 1 View  Result Date: 07/13/2020 CLINICAL DATA:  Shortness of breath and cough. EXAM: PORTABLE CHEST 1 VIEW COMPARISON:  01/18/2020 FINDINGS: Cardiomegaly and CABG changes again noted. Mild pulmonary vascular congestion is present. There is no evidence of focal airspace disease, pulmonary edema, suspicious pulmonary nodule/mass, pleural effusion, or pneumothorax. No acute bony abnormalities are identified. IMPRESSION: Cardiomegaly with mild pulmonary vascular congestion. Electronically Signed   By: Margarette Canada M.D.   On: 07/13/2020 17:08   _______________________________________________________________________________________________________ Latest  Blood pressure 123/64, pulse 86, temperature 98 F (36.7 C), temperature source Oral, resp. rate (!) 21, height _0  (1.702 m), weight 112.2 kg, SpO2 96 %.   Review of Systems:    Pertinent positives include:  Fevers  Fatigue shortness of breath at rest.   dyspnea on exertion Constitutional:  No weight loss, night sweats, , weight loss  HEENT:  No headaches, Difficulty swallowing,Tooth/dental problems,Sore throat,  No sneezing, itching, ear ache, nasal congestion, post nasal drip,  Cardio-vascular:  No chest pain, Orthopnea, PND, anasarca, dizziness, palpitations.no Bilateral lower extremity swelling  GI:  No heartburn, indigestion, abdominal pain, nausea, vomiting, diarrhea, change in bowel habits, loss of appetite, melena, blood in stool, hematemesis Resp:  no No, No excess mucus, no productive cough, No non-productive cough, No coughing up of blood.No change in color of mucus.No wheezing. Skin:  no rash or lesions. No jaundice GU:  no dysuria, change in color of urine, no urgency or frequency. No straining to urinate.  No flank pain.  Musculoskeletal:  No joint pain or no joint swelling. No decreased range of motion. No back pain.   Psych:  No change in mood or affect. No depression or anxiety. No memory loss.  Neuro: no localizing neurological complaints, no tingling, no weakness, no double vision, no gait abnormality, no slurred speech, no confusion  All systems reviewed and apart from [place] all are negative _______________________________________________________________________________________________ Past Medical History:   Past Medical History:  Diagnosis Date  . Antiphospholipid antibody syndrome ([place])   . CAD S/P percutaneous coronary angioplasty '98, '04, Feb 2017   [facility]  . Carotid arterial disease ([place]) 10/2015   moderate bilateral  . COPD (chronic obstructive pulmonary disease) ([place])   . Coronary artery disease   . Hypertension   . Non-insulin treated type 2 diabetes mellitus ([place])   . Pulmonary emboli ([place]) 2011  . RBBB   . Sleep apnea      Past Surgical History:  Procedure Laterality Date  . APPENDECTOMY    . APPLICATION OF WOUND VAC Left 03/18/2019   Procedure: Application Of Wound Vac;  Surgeon: Newt Minion, MD;  Location: [facility];  Service: Orthopedics;  Laterality: Left;  . BACK SURGERY  '89, '06  . COLONOSCOPY    . CORONARY ANGIOPLASTY WITH STENT PLACEMENT  '98, '04, Feb 2017  . I & D EXTREMITY Left 03/15/2019   Procedure: DEBRIDEMENT LEFT THIGH;  Surgeon: Newt Minion, MD;  Location: [facility];  Service: Orthopedics;  Laterality: Left;  . INCISION AND DRAINAGE OF WOUND Left 03/18/2019   Procedure: LEFT THIGH DEBRIDEMENT;  Surgeon: Newt Minion, MD;  Location: [facility];  Service: Orthopedics;  Laterality: Left;    Social History:  Ambulatory   Independently       reports that he has been smoking cigarettes. He has a 20.00 pack-year smoking history. He has never used smokeless tobacco. He reports that he does not drink alcohol and does not use drugs.   Family History:   Family History  Problem Relation Age of Onset  . CAD Father 54    ______________________________________________________________________________________________ Allergies: Allergies  Allergen Reactions  . Melatonin Anxiety  . Ramipril     cough     Prior to Admission medications   Medication Sig Start Date End Date Taking? Authorizing Provider  albuterol (PROVENTIL) (5 MG/ML) 0.5% nebulizer solution Take 0.5 mLs (2.5 mg total) by nebulization every 6 (six) hours as needed for wheezing or shortness of breath. 12/27/18   Wyvonnia Dusky, MD  albuterol (VENTOLIN HFA) 108 (90 Base) MCG/ACT inhaler Inhale 1 puff into the lungs every 6 (six) hours as needed for wheezing or shortness of breath.    [provider]  aspirin EC 81 MG EC tablet Take 1 tablet (81 mg total) by mouth daily. Swallow whole. 01/23/20   Dagar, Meredith Staggers, MD  Blood Glucose Monitoring Suppl (CONTOUR NEXT MONITOR) w/Device KIT by Does not apply route. 10/16/19   [provider]  Blood Glucose Monitoring Suppl ([place]) [place] 1 each by XX route as directed (For Blood Glucose Monitoring) 08/23/17   [provider]  DULoxetine (CYMBALTA) 60 MG capsule Take 60 mg by mouth once a week.  05/03/19   [provider]  enoxaparin (LOVENOX) 30 MG/0.3ML injection Inject 1.1 mLs (110 mg total) into the skin 2 (two) times daily for 2 days. 01/22/20 01/24/20  Dagar, Meredith Staggers, MD  Fluticasone Propionate, Inhal, (FLOVENT DISKUS) 100 MCG/BLIST AEPB Inhale 1 puff into the lungs daily as needed (wheezing & shortness of breath).    [provider]  gabapentin (NEURONTIN) 600 MG tablet Take 600 mg by mouth 3 (three) times daily.    [provider]  glucose blood (CONTOUR NEXT TEST) test strip Use to check blood sugar twice 2 time(s) daily. 10/16/19 10/15/20  [provider]  HYDROcodone-acetaminophen (NORCO) 10-325 MG tablet Take 1 tablet by mouth 2 (two) times daily.     [provider]  ipratropium-albuterol (DUONEB) 0.5-2.5 (3)  MG/3ML SOLN Inhale 3 mLs into the lungs at bedtime.  12/27/19 12/21/20  [provider]  Lancets (ACCU-CHEK SOFT TOUCH) lancets 2 (two) times daily. 10/16/19 10/15/20  [provider]  metoprolol tartrate (LOPRESSOR) 12.5 mg TABS tablet Take 12.5 mg by mouth 2 (two) times daily.  Patient not taking: Reported on 01/18/2020 08/22/17 03/14/19  [provider]  nicotine (NICODERM CQ - DOSED IN MG/24 HOURS) 14 mg/24hr patch Apply one patch daily x 2 weeks.  Begin after completion of 21 mg patches 10/06/19   [provider]  nitroGLYCERIN (NITROSTAT) 0.4 MG SL  tablet Place 0.4 mg under the tongue every 5 (five) minutes as needed for chest pain.  07/07/16   [provider]  potassium chloride (KLOR-CON) 20 MEQ packet Take 40 mEq by mouth 2 (two) times daily. 01/22/20   Dagar, Meredith Staggers, MD  PROAIR HFA 108 (90 Base) MCG/ACT inhaler Inhale 1-2 puffs into the lungs every 4 (four) hours as needed for wheezing.  07/07/16   [provider]  rosuvastatin (CRESTOR) 10 MG tablet Take 10 mg by mouth every evening. 07/07/16   [provider]  tamsulosin (FLOMAX) 0.4 MG CAPS capsule Take 0.4 mg by mouth daily. 06/05/16   [provider]  Tiotropium Bromide-Olodaterol (STIOLTO RESPIMAT) 2.5-2.5 MCG/ACT AERS Inhale 2 puffs into the lungs daily.  09/10/17   [provider]  tiZANidine (ZANAFLEX) 4 MG tablet Take 8 mg by mouth in the morning and at bedtime.     [provider]  topiramate (TOPAMAX) 50 MG tablet Take 50 mg by mouth 2 (two) times daily. 06/05/16   [provider]  warfarin (COUMADIN) 6 MG tablet Take 1 tablet (6 mg total) by mouth daily for 2 days. 01/22/20 01/24/20  Dagar, Meredith Staggers, MD  warfarin (COUMADIN) 7.5 MG tablet Take 1 tablet (7.5 mg total) by mouth at bedtime for 1 dose. 01/22/20 01/23/20  Dagar, Meredith Staggers, MD   ___________________________________________________________________________________________________ Physical  Exam: Vitals with BMI 07/13/2020 07/13/2020 07/13/2020  Height - - -  Weight - - -  BMI - - -  Systolic [phone]  Diastolic 64 65 62  Pulse 86 87 84    1. General:  in No  Acute distress   Chronically ill  -appearing 2. Psychological: Alert and  Oriented 3. Head/ENT:    Dry Mucous Membranes                          Head Non traumatic, neck supple                            Poor Dentition 4. SKIN:   decreased Skin turgor,  Skin clean Dry and intact no rash 5. Heart: Regular rate and rhythm no  Murmur, no Rub or gallop 6. Lungs: some wheezes or crackles   7. Abdomen: Soft,  non-tender, Non distended  bowel sounds present 8. Lower extremities: no clubbing, cyanosis, no edema, chronic venous statsis changes 9. Neurologically Grossly intact, moving all 4 extremities equally  10. MSK: Normal range of motion   Chart has been reviewed   __________________________________________________________________________________  Assessment/Plan   65 y.o. male with medical history significant of COPD, CHF, CAD sp CABG, HTN, PE on coumadin, DM type II, RBBB, OSA on oxygen at night     Admitted for COPD exacerbation  Present on Admission: . COPD exacerbation ([place]) -  Will initiate: Steroid taper  -  Antibiotics rocephin, - Albuterol  PRN, - scheduled duoneb,    Titrate O2 to saturation >90%. Follow patients respiratory status.  Order resp panel   VBG ordered  covid neg  . Tobacco abuse - - Spoke about importance of quitting spent 5 minutes discussing options for treatment, prior attempts at quitting, and dangers of smoking  -At this point patient is   NOT  interested in quitting  - order nicotine patch   - nursing tobacco cessation protocol   . Sleep apnea -continue oxygen and CPAP  . RBBB (right bundle branch block) -chronic stable  .  Hypertension hold beta-blocker given COPD  . Hyperlipidemia Continue crest :  . History of pulmonary embolus (PE)-2011 -on Coumadin continue INR  therapeutic  . GERD (gastroesophageal reflux disease) -chronic stable  . Coronary atherosclerosis -  - chronic, continue aspirin  and statin  Hold beta blocker   . Anti-phospholipid syndrome (HCC) continue Coumadin  . Prolonged QT interval - - will monitor on tele avoid QT prolonging medications, rehydrate correct electrolytes  . Acute on chronic respiratory failure with hypoxia (HCC) -  this patient has acute respiratory failure with Hypoxia   as documented by the presence of following: O2 saturatio< 90% on RA   Likely due to:  COPD exacerbation  Provide O2 therapy and titrate as needed  Continuous pulse ox   check Pulse ox with ambulation prior to discharge  flutter valve ordered   . Chronic diastolic CHF (congestive heart failure) (HCC) -currently does not appear to be fluid overload avoid over aggressive fluid resuscitation appears to be stable at this point, cont Torsemide   DM2 -  - Order Sensitive SSI     -  check TSH and HgA1C  - Hold HOme medications     Other plan as per orders.  DVT prophylaxis:  couamdin      Code Status:    Code Status: Prior   DNR/DNI as per patient     I had personally discussed CODE STATUS with patient      Family Communication:   Family not at  Bedside    Disposition Plan:     To home once workup is complete and patient is stable   Following barriers for discharge:                                                       Will need to be able to tolerate PO hypoxia improved                      Would benefit from PT/OT eval prior to DC  Ordered                                        Consults called: none  Admission status:  ED Disposition    ED Disposition Condition [place]: [place] [[id number]]  Level of Care: Progressive Cardiac [[id number]]  Admit to Progressive based on following criteria: RESPIRATORY PROBLEMS hypoxemic/hypercapnic respiratory failure that is responsive to NIPPV (BiPAP) or  High Flow Nasal Cannula (6-80 lpm). Frequent assessment/intervention, no > Q2 hrs < Q4 hrs, to maintain oxygenation and pulmonary hygiene.  Covid Evaluation: Confirmed COVID Negative  Diagnosis: COPD exacerbation [facility]) [[id number]]  Admitting Physician: Toy Baker [[id number]]  Attending Physician: Toy Baker [[id number]]  Estimated length of stay: past midnight tomorrow  Certification:: I certify this patient will need inpatient services for at least 2 midnights           inpatient     I Expect 2 midnight stay secondary to severity of patient's current illness need for inpatient interventions justified by the following:  hemodynamic instability despite optimal treatment ( hypoxia )   and extensive comorbidities including:    DM2    CHF  CAD   COPD/asthma .   Marland Kitchen Chronic anticoagulation  That are currently affecting medical management.   I expect  patient to be hospitalized for 2 midnights requiring inpatient medical care.  Patient is at high risk for adverse outcome (such as loss of life or disability) if not treated.  Indication for inpatient stay as follows:    New or worsening hypoxia  Need for IV antibiotics, Iv steroids frequent Nebulizer  Level of care   Progressive tele indefinitely please discontinue once patient no longer qualifies COVID-19 Labs    Lab Results  Component Value Date   [place] NEGATIVE 07/13/2020     Precautions: admitted as  Covid Negative    PPE: Used by the provider:   N95  eye Goggles,  Gloves     Jose [name] 07/13/2020, 9:43 PM    [facility]     after 2 AM please page floor coverage PA If 7AM-7PM, please contact the day team taking care of the patient using [email or web address]   Patient was evaluated in the context of the global COVID-19 pandemic, which necessitated consideration that the patient might be at risk for infection with the SARS-CoV-2 virus that causes COVID-19. Institutional protocols and algorithms  that pertain to the evaluation of patients at risk for COVID-19 are in a state of rapid change based on information released by regulatory bodies including the CDC and federal and state organizations. These policies and algorithms were followed during the patient's care.

## 2020-07-13 NOTE — ED Notes (Signed)
Pharmacy tech at bedside 

## 2020-07-13 NOTE — ED Triage Notes (Signed)
Patient arrived via EMS from home for increased shortness of breath that began 10 days ago. Patient's wife is also sick at home. Pt has a hx of COPD and CHF. States he has been progressively getting worse making it hard to ambulate without feeling like he is going to lose his breath. Pt was 76% on RA when EMS arrived. Has received 125 Solumedrol, Albuterol and a Duoneb.

## 2020-07-14 ENCOUNTER — Encounter: Payer: Self-pay | Admitting: Internal Medicine

## 2020-07-14 ENCOUNTER — Other Ambulatory Visit: Payer: Self-pay

## 2020-07-14 LAB — COMPREHENSIVE METABOLIC PANEL
ALT: 32 U/L (ref 0–44)
AST: 27 U/L (ref 15–41)
Albumin: 3.5 g/dL (ref 3.5–5.0)
Alkaline Phosphatase: 60 U/L (ref 38–126)
Anion gap: 8 (ref 5–15)
BUN: 14 mg/dL (ref 8–23)
CO2: 30 mmol/L (ref 22–32)
Calcium: 8.7 mg/dL — ABNORMAL LOW (ref 8.9–10.3)
Chloride: 102 mmol/L (ref 98–111)
Creatinine, Ser: 0.63 mg/dL (ref 0.61–1.24)
GFR, Estimated: >60 mL/min (ref >60)
Glucose, Bld: 183 mg/dL — ABNORMAL HIGH (ref 70–99)
Potassium: 4 mmol/L (ref 3.5–5.1)
Sodium: 140 mmol/L (ref 135–145)
Total Bilirubin: 0.5 mg/dL (ref 0.3–1.2)
Total Protein: 7.7 g/dL (ref 6.5–8.1)

## 2020-07-14 LAB — CBC WITH DIFFERENTIAL/PLATELET
Abs Immature Granulocytes: 0.02 10*3/uL (ref 0.00–0.07)
Basophils Absolute: 0 10*3/uL (ref 0.0–0.1)
Basophils Relative: 0 %
Eosinophils Absolute: 0 10*3/uL (ref 0.0–0.5)
Eosinophils Relative: 0 %
HCT: 42 % (ref 39.0–52.0)
Hemoglobin: 13.1 g/dL (ref 13.0–17.0)
Immature Granulocytes: 0 %
Lymphocytes Relative: 24 %
Lymphs Abs: 1.7 10*3/uL (ref 0.7–4.0)
MCH: 29 pg (ref 26.0–34.0)
MCHC: 31.2 g/dL (ref 30.0–36.0)
MCV: 92.9 fL (ref 80.0–100.0)
Monocytes Absolute: 0.8 10*3/uL (ref 0.1–1.0)
Monocytes Relative: 11 %
Neutro Abs: 4.7 10*3/uL (ref 1.7–7.7)
Neutrophils Relative %: 65 %
Platelets: 212 10*3/uL (ref 150–400)
RBC: 4.52 MIL/uL (ref 4.22–5.81)
RDW: 15.3 % (ref 11.5–15.5)
WBC: 7.3 10*3/uL (ref 4.0–10.5)
nRBC: 0 % (ref 0.0–0.2)

## 2020-07-14 LAB — GLUCOSE, CAPILLARY
Glucose-Capillary: 153 mg/dL — ABNORMAL HIGH (ref 70–99)
Glucose-Capillary: 156 mg/dL — ABNORMAL HIGH (ref 70–99)
Glucose-Capillary: 166 mg/dL — ABNORMAL HIGH (ref 70–99)
Glucose-Capillary: 203 mg/dL — ABNORMAL HIGH (ref 70–99)
Glucose-Capillary: 246 mg/dL — ABNORMAL HIGH (ref 70–99)
Glucose-Capillary: 254 mg/dL — ABNORMAL HIGH (ref 70–99)
Glucose-Capillary: 266 mg/dL — ABNORMAL HIGH (ref 70–99)

## 2020-07-14 LAB — PROTIME-INR
INR: 2.3 — ABNORMAL HIGH (ref 0.8–1.2)
Prothrombin Time: 25.4 seconds — ABNORMAL HIGH (ref 11.4–15.2)

## 2020-07-14 LAB — TSH: TSH: 0.327 u[IU]/mL — ABNORMAL LOW (ref 0.350–4.500)

## 2020-07-14 LAB — HEMOGLOBIN A1C
Hgb A1c MFr Bld: 7.7 % — ABNORMAL HIGH (ref 4.8–5.6)
Mean Plasma Glucose: 174.29 mg/dL

## 2020-07-14 LAB — HIV ANTIBODY (ROUTINE TESTING W REFLEX): HIV Screen 4th Generation wRfx: NONREACTIVE

## 2020-07-14 LAB — MAGNESIUM: Magnesium: 2.3 mg/dL (ref 1.7–2.4)

## 2020-07-14 LAB — PHOSPHORUS: Phosphorus: 3.9 mg/dL (ref 2.5–4.6)

## 2020-07-14 LAB — LACTIC ACID, PLASMA: Lactic Acid, Venous: 1.4 mmol/L (ref 0.5–1.9)

## 2020-07-14 MED ORDER — ROSUVASTATIN CALCIUM 10 MG PO TABS
10.0000 mg | ORAL_TABLET | Freq: Every evening | ORAL | Status: DC
  Administered 2020-07-14 – 2020-07-19 (×6): 10 mg via ORAL
  Filled 2020-07-14 (×6): qty 1

## 2020-07-14 MED ORDER — METHYLPREDNISOLONE SODIUM SUCC 125 MG IJ SOLR
60.0000 mg | Freq: Four times a day (QID) | INTRAMUSCULAR | Status: AC
  Administered 2020-07-14 – 2020-07-15 (×3): 60 mg via INTRAVENOUS
  Filled 2020-07-14 (×3): qty 2

## 2020-07-14 MED ORDER — HYDROCODONE-ACETAMINOPHEN 5-325 MG PO TABS: 1.0000 | ORAL_TABLET | ORAL | Status: DC | PRN

## 2020-07-14 MED ORDER — SODIUM CHLORIDE 0.9 % IV SOLN
1.0000 g | INTRAVENOUS | Status: AC
  Administered 2020-07-14 – 2020-07-18 (×5): 1 g via INTRAVENOUS
  Filled 2020-07-14 (×5): qty 10
  Filled 2020-07-14: qty 1

## 2020-07-14 MED ORDER — IPRATROPIUM-ALBUTEROL 0.5-2.5 (3) MG/3ML IN SOLN
3.0000 mL | Freq: Four times a day (QID) | RESPIRATORY_TRACT | Status: DC
  Administered 2020-07-14 – 2020-07-15 (×6): 3 mL via RESPIRATORY_TRACT
  Filled 2020-07-14 (×6): qty 3

## 2020-07-14 MED ORDER — TAMSULOSIN HCL 0.4 MG PO CAPS
0.4000 mg | ORAL_CAPSULE | Freq: Every day | ORAL | Status: DC
  Administered 2020-07-14 – 2020-07-15 (×2): 0.4 mg via ORAL
  Filled 2020-07-14 (×2): qty 1

## 2020-07-14 MED ORDER — PREDNISONE 20 MG PO TABS
40.0000 mg | ORAL_TABLET | Freq: Every day | ORAL | Status: AC
  Administered 2020-07-15 – 2020-07-18 (×4): 40 mg via ORAL
  Filled 2020-07-14 (×4): qty 2

## 2020-07-14 MED ORDER — ASPIRIN EC 81 MG PO TBEC
81.0000 mg | DELAYED_RELEASE_TABLET | Freq: Every day | ORAL | Status: DC
  Administered 2020-07-14 – 2020-07-19 (×6): 81 mg via ORAL
  Filled 2020-07-14 (×6): qty 1

## 2020-07-14 MED ORDER — WARFARIN SODIUM 6 MG PO TABS
6.0000 mg | ORAL_TABLET | Freq: Once | ORAL | Status: AC
  Administered 2020-07-14: 6 mg via ORAL
  Filled 2020-07-14: qty 1

## 2020-07-14 MED ORDER — GABAPENTIN 600 MG PO TABS
600.0000 mg | ORAL_TABLET | Freq: Three times a day (TID) | ORAL | Status: DC
  Administered 2020-07-14 – 2020-07-19 (×17): 600 mg via ORAL
  Filled 2020-07-14 (×17): qty 1

## 2020-07-14 MED ORDER — METOPROLOL TARTRATE 25 MG PO TABS
12.5000 mg | ORAL_TABLET | Freq: Two times a day (BID) | ORAL | Status: DC
  Administered 2020-07-14 – 2020-07-19 (×12): 12.5 mg via ORAL
  Filled 2020-07-14 (×4): qty 1
  Filled 2020-07-14: qty 0.5
  Filled 2020-07-14 (×8): qty 1

## 2020-07-14 MED ORDER — TIZANIDINE HCL 4 MG PO TABS
4.0000 mg | ORAL_TABLET | Freq: Two times a day (BID) | ORAL | Status: DC | PRN
  Administered 2020-07-14 – 2020-07-15 (×3): 4 mg via ORAL
  Filled 2020-07-14 (×4): qty 1

## 2020-07-14 MED ORDER — SODIUM CHLORIDE 0.9 % IV SOLN
250.0000 mL | INTRAVENOUS | Status: DC | PRN
  Administered 2020-07-16: 250 mL via INTRAVENOUS

## 2020-07-14 MED ORDER — ALBUTEROL SULFATE HFA 108 (90 BASE) MCG/ACT IN AERS
2.0000 | INHALATION_SPRAY | Freq: Four times a day (QID) | RESPIRATORY_TRACT | Status: DC | PRN
  Filled 2020-07-14: qty 6.7

## 2020-07-14 MED ORDER — FLUTICASONE FUROATE-VILANTEROL 200-25 MCG/INH IN AEPB
1.0000 | INHALATION_SPRAY | Freq: Every day | RESPIRATORY_TRACT | Status: DC
  Administered 2020-07-14 – 2020-07-19 (×6): 1 via RESPIRATORY_TRACT
  Filled 2020-07-14 (×2): qty 28

## 2020-07-14 MED ORDER — FLUTICASONE-UMECLIDIN-VILANT 200-62.5-25 MCG/INH IN AEPB: 1.0000 | INHALATION_SPRAY | Freq: Every day | RESPIRATORY_TRACT | Status: DC

## 2020-07-14 MED ORDER — ACETAMINOPHEN 325 MG PO TABS: 650.0000 mg | ORAL_TABLET | Freq: Four times a day (QID) | ORAL | Status: DC | PRN

## 2020-07-14 MED ORDER — UMECLIDINIUM BROMIDE 62.5 MCG/INH IN AEPB
1.0000 | INHALATION_SPRAY | Freq: Every day | RESPIRATORY_TRACT | Status: DC
  Administered 2020-07-14 – 2020-07-19 (×6): 1 via RESPIRATORY_TRACT
  Filled 2020-07-14 (×2): qty 7

## 2020-07-14 MED ORDER — SODIUM CHLORIDE 0.9% FLUSH
3.0000 mL | Freq: Two times a day (BID) | INTRAVENOUS | Status: DC
  Administered 2020-07-14 – 2020-07-19 (×10): 3 mL via INTRAVENOUS

## 2020-07-14 MED ORDER — ALBUTEROL SULFATE (2.5 MG/3ML) 0.083% IN NEBU
2.5000 mg | INHALATION_SOLUTION | RESPIRATORY_TRACT | Status: DC | PRN
  Administered 2020-07-16: 2.5 mg via RESPIRATORY_TRACT
  Filled 2020-07-14: qty 3

## 2020-07-14 MED ORDER — PANTOPRAZOLE SODIUM 40 MG PO TBEC
40.0000 mg | DELAYED_RELEASE_TABLET | Freq: Two times a day (BID) | ORAL | Status: DC
  Administered 2020-07-14 – 2020-07-19 (×11): 40 mg via ORAL
  Filled 2020-07-14 (×11): qty 1

## 2020-07-14 MED ORDER — ACETAMINOPHEN 650 MG RE SUPP: 650.0000 mg | Freq: Four times a day (QID) | RECTAL | Status: DC | PRN

## 2020-07-14 MED ORDER — SODIUM CHLORIDE 0.9% FLUSH: 3.0000 mL | INTRAVENOUS | Status: DC | PRN

## 2020-07-14 MED ORDER — DULOXETINE HCL 30 MG PO CPEP
60.0000 mg | ORAL_CAPSULE | Freq: Every day | ORAL | Status: DC
  Administered 2020-07-14 – 2020-07-19 (×6): 60 mg via ORAL
  Filled 2020-07-14 (×6): qty 2

## 2020-07-14 MED ORDER — TOPIRAMATE 25 MG PO TABS
50.0000 mg | ORAL_TABLET | Freq: Two times a day (BID) | ORAL | Status: DC
  Administered 2020-07-14 – 2020-07-19 (×12): 50 mg via ORAL
  Filled 2020-07-14 (×14): qty 2

## 2020-07-14 NOTE — Progress Notes (Addendum)
ANTICOAGULATION CONSULT NOTE  Pharmacy Consult for Warfarin  Indication: continuation of therapy for VTE  Patient Measurements: Height: 5\' 7"  (170.2 cm) Weight: 112.2 kg (247 lb 5.7 oz) IBW/kg (Calculated) : 66.1  Labs: Recent Labs    07/13/20 1632 07/14/20 0500 07/14/20 0600  HGB 13.3  --  13.1  HCT 42.3  --  42.0  PLT 230  --  212  APTT 70*  --   --   LABPROT 24.8* 25.4*  --   INR 2.2* 2.3*  --   CREATININE 0.73  --  0.63  TROPONINIHS 7  --   --     Estimated Creatinine Clearance: 110 mL/min (by C-G formula based on SCr of 0.63 mg/dL).   Medical History: Past Medical History:  Diagnosis Date  . Antiphospholipid antibody syndrome (HCC)   . CAD S/P percutaneous coronary angioplasty '98, '04, Feb 2017   [facility]  . Carotid arterial disease (HCC) 10/2015   moderate bilateral  . COPD (chronic obstructive pulmonary disease) (HCC)   . Coronary artery disease   . Hypertension   . Non-insulin treated type 2 diabetes mellitus (HCC)   . Pulmonary emboli (HCC) 2011  . RBBB   . Sleep apnea    Assessment: Patient is a 65yo male admitted with shortness of breath. 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   Home Dose: 7mg  on Monday and Friday                       6mg  all other days  INR on Admission: 2.2   DDI: Ceftriaxone  Goal of Therapy:  INR 2-3   Plan:  Patient is therapeutic on home regimen. Will order Warfarin 6mg  dose for this evening. Will follow up on AM INR results to determine future dosing.  Tuesday 07/14/2020 7:40 AM

## 2020-07-14 NOTE — Progress Notes (Signed)
PT Cancellation Note  Patient Details Name: Jose [name] MRN: [id number] DOB: Nov 14, 1955   Cancelled Treatment:    Reason Eval/Treat Not Completed: Patient declined, no reason specified. Pt declining to participate with PT evaluation, as pt states that he did not sleep at all last night. Requests that PT come back tomorrow for evaluation. Will follow up with PT evaluation tomorrow as appropriate.  Vira Blanco, PT, DPT 11:35 AM,07/14/20

## 2020-07-14 NOTE — Plan of Care (Addendum)
Pt received from ED. AOx4. Pt denies any pain. Denies SOB. No respt distress noted. Pt's VSS. All needs attended. Will continue to monitor.    Problem: Education: Goal: Knowledge of General Education information will improve Description: Including pain rating scale, medication(s)/side effects and non-pharmacologic comfort measures Outcome: Progressing   Problem: Health Behavior/Discharge Planning: Goal: Ability to manage health-related needs will improve Outcome: Progressing   Problem: Clinical Measurements: Goal: Ability to maintain clinical measurements within normal limits will improve Outcome: Progressing Goal: Will remain free from infection Outcome: Progressing Goal: Diagnostic test results will improve Outcome: Progressing Goal: Respiratory complications will improve Outcome: Progressing Goal: Cardiovascular complication will be avoided Outcome: Progressing   Problem: Activity: Goal: Risk for activity intolerance will decrease Outcome: Progressing   Problem: Nutrition: Goal: Adequate nutrition will be maintained Outcome: Progressing   Problem: Coping: Goal: Level of anxiety will decrease Outcome: Progressing   Problem: Elimination: Goal: Will not experience complications related to bowel motility Outcome: Progressing Goal: Will not experience complications related to urinary retention Outcome: Progressing   Problem: Pain Managment: Goal: General experience of comfort will improve Outcome: Progressing   Problem: Safety: Goal: Ability to remain free from injury will improve Outcome: Progressing   Problem: Skin Integrity: Goal: Risk for impaired skin integrity will decrease Outcome: Progressing   Problem: Education: Goal: Ability to demonstrate management of disease process will improve Outcome: Progressing Goal: Ability to verbalize understanding of medication therapies will improve Outcome: Progressing Goal: Individualized Educational  Video(s) Outcome: Progressing   Problem: Activity: Goal: Capacity to carry out activities will improve Outcome: Progressing   Problem: Cardiac: Goal: Ability to achieve and maintain adequate cardiopulmonary perfusion will improve Outcome: Progressing

## 2020-07-14 NOTE — Progress Notes (Addendum)
Progress Note    Bradie Lacock  XVQ:[id number] DOB: Oct 22, 1955  DOA: 07/13/2020 PCP: [name], MD      Brief Narrative:    Medical records reviewed and are as summarized below:  Layten Aiken is a 65 y.o. male with medical history significant for COPD, chronic diastolic CHF, CAD s/p PCI with stent, antiphospholipid syndrome, history of pulmonary embolism on Coumadin, type 2 diabetes mellitus, obstructive sleep apnea on CPAP at night, chronic hypoxic respiratory failure on 2 L/min oxygen at night, carotid artery disease, right bundle branch block, morbid obesity, hyperlipidemia.  He presented to the hospital because of shortness of breath, wheezing and hypoxia.  He said his oxygen saturation was 76% on room air.  He was admitted to the hospital for COPD exacerbation and acute on chronic hypoxic respiratory failure.  He was treated with steroids, antibiotics and bronchodilators.    Assessment/Plan:   Active Problems:   Tobacco abuse   Anti-phospholipid syndrome (HCC)   Hyperlipidemia   Sleep apnea   History of pulmonary embolus (PE)-2011   Non-insulin treated type 2 diabetes mellitus (HCC)   RBBB (right bundle branch block)   Coronary atherosclerosis   GERD (gastroesophageal reflux disease)   Hypertension   COPD exacerbation (HCC)   Prolonged QT interval   Acute on chronic respiratory failure with hypoxia (HCC)   Chronic diastolic CHF (congestive heart failure) (HCC)    Body mass index is 38.74 kg/m.  (Morbid obesity)   COPD exacerbation: Continue IV steroids, bronchodilators and IV Rocephin.  Acute on chronic hypoxic respiratory failure: Continue 2 L/min oxygen via nasal cannula. He said he uses 2 L/min at night with his CPAP machine.  Chronic diastolic CHF: Continue torsemide  History of pulmonary embolism in 2011, antiphospholipid syndrome: Continue Coumadin  CAD s/p PCI with stent: Continue aspirin and statins  Type II DM: Ozempic on hold.  NovoLog  as needed for hyperglycemia.  Prolonged QT interval likely due to right bundle branch block.  Diet Order            Diet Carb Modified Fluid consistency: Thin; Room service appropriate? Yes  Diet effective now                    Consultants:  None  Procedures:  None    Medications:   . aspirin EC  81 mg Oral Daily  . DULoxetine  60 mg Oral Daily  . Fluticasone-Umeclidin-Vilant  1 puff Inhalation Daily  . gabapentin  600 mg Oral TID  . insulin aspart  0-9 Units Subcutaneous Q4H  . ipratropium-albuterol  3 mL Nebulization Q6H  . methylPREDNISolone (SOLU-MEDROL) injection  60 mg Intravenous Q6H   Followed by  . [START ON 07/15/2020] predniSONE  40 mg Oral Q breakfast  . metoprolol tartrate  12.5 mg Oral BID  . nicotine  21 mg Transdermal Daily  . pantoprazole  40 mg Oral BID  . rosuvastatin  10 mg Oral QPM  . sodium chloride flush  3 mL Intravenous Q12H  . tamsulosin  0.4 mg Oral Daily  . topiramate  50 mg Oral BID  . torsemide  20 mg Oral Daily  . warfarin  6 mg Oral ONCE-1600  . Warfarin - Pharmacist Dosing Inpatient   Does not apply q1600   Continuous Infusions: . sodium chloride    . cefTRIAXone (ROCEPHIN)  IV 1 g (07/14/20 1012)     Anti-infectives (From admission, onward)   Start     Dose/Rate  Route Frequency Ordered Stop   07/14/20 0645  cefTRIAXone (ROCEPHIN) 1 g in sodium chloride 0.9 % 100 mL IVPB        1 g 200 mL/hr over 30 Minutes Intravenous Every 24 hours 07/14/20 0549 07/19/20 0644             Family Communication/Anticipated D/C date and plan/Code Status   DVT prophylaxis:  warfarin (COUMADIN) tablet 6 mg     Code Status: DNR  Family Communication: None Disposition Plan:    Status is: Inpatient  Remains inpatient appropriate because:IV treatments appropriate due to intensity of illness or inability to take PO and Inpatient level of care appropriate due to severity of illness   Dispo: The patient is from: Home               Anticipated d/c is to: Home              Patient currently is not medically stable to d/c.   Difficult to place patient No           Subjective:   C/o shortness of breath and wheezing.  Objective:    Vitals:   07/14/20 0123 07/14/20 0508 07/14/20 0807 07/14/20 1142  BP: 120/66 138/66 (!) 153/73 (!) 124/49  Pulse: 77 69 69 77  Resp: (!) 24 18 (!) 25 18  Temp:  98 F (36.7 C) (!) 97.5 F (36.4 C) 97.7 F (36.5 C)  TempSrc:      SpO2: 96% 99% 96% 98%  Weight:      Height:       No data found.   Intake/Output Summary (Last 24 hours) at 07/14/2020 1257 Last data filed at 07/14/2020 1146 Gross per 24 hour  Intake --  Output 875 ml  Net -875 ml   Filed Weights   07/13/20 1629  Weight: 112.2 kg    Exam:  GEN: NAD SKIN: Chronic multiple brownish round and oval shaped plaque-like lesions on the back and chest. Chronic skin changes on legs likely from chronic venous insufficiency EYES: EOMI ENT: MMM CV: RRR PULM: B/l wheezing.  No rales heard ABD: soft, ND, NT, +BS CNS: AAO x 3, non focal EXT: Trace b/l leg edema, no tenderness        Data Reviewed:   I have personally reviewed following labs and imaging studies:  Labs: Labs show the following:   Basic Metabolic Panel: Recent Labs  Lab 07/13/20 1632 07/14/20 0600  NA 135 140  K 4.4 4.0  CL 100 102  CO2 28 30  GLUCOSE 124* 183*  BUN 17 14  CREATININE 0.73 0.63  CALCIUM 8.4* 8.7*  MG 2.0 2.3  PHOS  --  3.9   GFR Estimated Creatinine Clearance: 110 mL/min (by C-G formula based on SCr of 0.63 mg/dL). Liver Function Tests: Recent Labs  Lab 07/13/20 1632 07/14/20 0600  AST 38 27  ALT 36 32  ALKPHOS 68 60  BILITOT 0.6 0.5  PROT 7.8 7.7  ALBUMIN 3.6 3.5   No results for input(s): LIPASE, AMYLASE in the last 168 hours. No results for input(s): AMMONIA in the last 168 hours. Coagulation profile Recent Labs  Lab 07/13/20 1632 07/14/20 0500  INR 2.2* 2.3*    CBC: Recent Labs   Lab 07/13/20 1632 07/14/20 0600  WBC 11.4* 7.3  NEUTROABS 6.1 4.7  HGB 13.3 13.1  HCT 42.3 42.0  MCV 92.6 92.9  PLT 230 212   Cardiac Enzymes: No results for input(s): CKTOTAL, CKMB, CKMBINDEX,  TROPONINI in the last 168 hours. BNP (last 3 results) No results for input(s): PROBNP in the last 8760 hours. CBG: Recent Labs  Lab 07/13/20 2058 07/14/20 0135 07/14/20 0448 07/14/20 0811  GLUCAP 175* 203* 153* 156*   D-Dimer: No results for input(s): DDIMER in the last 72 hours. Hgb A1c: No results for input(s): HGBA1C in the last 72 hours. Lipid Profile: No results for input(s): CHOL, HDL, LDLCALC, TRIG, CHOLHDL, LDLDIRECT in the last 72 hours. Thyroid function studies: Recent Labs    07/14/20 0600  TSH 0.327*   Anemia work up: No results for input(s): VITAMINB12, FOLATE, FERRITIN, TIBC, IRON, RETICCTPCT in the last 72 hours. Sepsis Labs: Recent Labs  Lab 07/13/20 1632 07/14/20 0500 07/14/20 0600  PROCALCITON <0.10  --   --   WBC 11.4*  --  7.3  LATICACIDVEN 0.9 1.4  --     Microbiology Recent Results (from the past 240 hour(s))  Resp Panel by RT-PCR (Flu A&B, Covid) Nasopharyngeal Swab     Status: None   Collection Time: 07/13/20  4:32 PM   Specimen: Nasopharyngeal Swab; Nasopharyngeal(NP) swabs in vial transport medium  Result Value Ref Range Status   SARS Coronavirus 2 by RT PCR NEGATIVE NEGATIVE Final    Comment: (NOTE) SARS-CoV-2 target nucleic acids are NOT DETECTED.  The SARS-CoV-2 RNA is generally detectable in upper respiratory specimens during the acute phase of infection. The lowest concentration of SARS-CoV-2 viral copies this assay can detect is 138 copies/mL. A negative result does not preclude SARS-Cov-2 infection and should not be used as the sole basis for treatment or other patient management decisions. A negative result may occur with  improper specimen collection/handling, submission of specimen other than nasopharyngeal swab, presence of  viral mutation(s) within the areas targeted by this assay, and inadequate number of viral copies(<138 copies/mL). A negative result must be combined with clinical observations, patient history, and epidemiological information. The expected result is Negative.  Fact Sheet for Patients:  BloggerCourse.com  Fact Sheet for Healthcare Providers:  SeriousBroker.it  This test is no t yet approved or cleared by the [place] FDA and  has been authorized for detection and/or diagnosis of SARS-CoV-2 by FDA under an Emergency Use Authorization (EUA). This EUA will remain  in effect (meaning this test can be used) for the duration of the COVID-19 declaration under Section 564(b)(1) of the Act, 21 U.S.C.section 360bbb-3(b)(1), unless the authorization is terminated  or revoked sooner.       Influenza A by PCR NEGATIVE NEGATIVE Final   Influenza B by PCR NEGATIVE NEGATIVE Final    Comment: (NOTE) The Xpert Xpress SARS-CoV-2/FLU/RSV plus assay is intended as an aid in the diagnosis of influenza from Nasopharyngeal swab specimens and should not be used as a sole basis for treatment. Nasal washings and aspirates are unacceptable for Xpert Xpress SARS-CoV-2/FLU/RSV testing.  Fact Sheet for Patients: BloggerCourse.com  Fact Sheet for Healthcare Providers: SeriousBroker.it  This test is not yet approved or cleared by the [place] FDA and has been authorized for detection and/or diagnosis of SARS-CoV-2 by FDA under an Emergency Use Authorization (EUA). This EUA will remain in effect (meaning this test can be used) for the duration of the COVID-19 declaration under Section 564(b)(1) of the Act, 21 U.S.C. section 360bbb-3(b)(1), unless the authorization is terminated or revoked.  Performed at [facility], [address] [place], [place] [address]   Blood culture (routine  single)     Status: None (Preliminary result)  Collection Time: 07/13/20  5:23 PM   Specimen: BLOOD  Result Value Ref Range Status   Specimen Description BLOOD RIGHT ANTECUBITAL  Final   Special Requests   Final    BOTTLES DRAWN AEROBIC AND ANAEROBIC Blood Culture adequate volume   Culture   Final    NO GROWTH < 12 HOURS Performed at [facility], [address] [place], [place] [address]    Report Status PENDING  Incomplete    Procedures and diagnostic studies:  CT Angio Chest PE W and/or Wo Contrast  Result Date: 07/13/2020 CLINICAL DATA:  Increasing shortness of breath for 10 days, COPD EXAM: CT ANGIOGRAPHY CHEST WITH CONTRAST TECHNIQUE: Multidetector CT imaging of the chest was performed using the standard protocol during bolus administration of intravenous contrast. Multiplanar CT image reconstructions and MIPs were obtained to evaluate the vascular anatomy. CONTRAST:  75mL OMNIPAQUE IOHEXOL 350 MG/ML SOLN COMPARISON:  07/13/2020, 12/27/2018 FINDINGS: Cardiovascular: This is a technically adequate evaluation of the pulmonary vasculature. There are no filling defects or pulmonary emboli. The heart is unremarkable without pericardial effusion. No evidence of thoracic aortic aneurysm or dissection. Postsurgical changes are seen from median sternotomy and coronary artery bypass. Mild atherosclerosis of the aortic arch. Extensive atherosclerosis of the native coronary vessels. Moderate calcification of the aortic valve. Mediastinum/Nodes: No pathologically enlarged mediastinal, hilar, or axillary adenopathy. Thyroid gland, trachea, and esophagus demonstrate no significant findings. Lungs/Pleura: No acute airspace disease, effusion, or pneumothorax. Minimal scarring within the right lower lobe. Central airways are patent. Upper Abdomen: No acute abnormality. Musculoskeletal: No acute or destructive bony lesions. Reconstructed images demonstrate no additional findings. Review of the MIP  images confirms the above findings. IMPRESSION: 1. No evidence of pulmonary embolus. 2. No acute intrathoracic process. 3.  Aortic Atherosclerosis (ICD10-I70.0). Electronically Signed   By: Sharlet SalinaMichael  Brown M.D.   On: 07/13/2020 19:25   DG Chest Portable 1 View  Result Date: 07/13/2020 CLINICAL DATA:  Shortness of breath and cough. EXAM: PORTABLE CHEST 1 VIEW COMPARISON:  01/18/2020 FINDINGS: Cardiomegaly and CABG changes again noted. Mild pulmonary vascular congestion is present. There is no evidence of focal airspace disease, pulmonary edema, suspicious pulmonary nodule/mass, pleural effusion, or pneumothorax. No acute bony abnormalities are identified. IMPRESSION: Cardiomegaly with mild pulmonary vascular congestion. Electronically Signed   By: Harmon PierJeffrey  Hu M.D.   On: 07/13/2020 17:08               LOS: 1 day   Connelly Spruell  [facility]   Pager on [email or web address]. If 7PM-7AM, please contact night-coverage at [email or web address]     07/14/2020, 12:57 PM

## 2020-07-14 NOTE — Evaluation (Signed)
Occupational Therapy Evaluation Patient Details Name: Jose [name] MRN: [id number] DOB: February 08, 1956 Today's Date: 07/14/2020    History of Present Illness Pt admitted to [facility] on 07/13/20 for c/o worsening SOB over the past 10 days. Pt hypoxic with SpO2 in 70's upon arrival; pt states he does not currently use O2 at home during the day. PMH significant for: T2DM, COPD, hx PE, CAD, CHF, RBBB and HTN. Imaging revealed vascular congestion, but was negative for PE.   Clinical Impression   Pt seen for OT evaluation/treatment on this date. Upon arrival to room, pt awake and seated upright in recliner. Pt A&Ox4 and agreeable to session. Prior to admission, pt reports being independent without AD for functional mobility of short household distances. Pt reports that he was working from home, cooking, driving, and ordering groceries to be delivered to house. Pt endorsed 4 syncope events in past 6 months. At baseline, pt only uses O2 at night.   Pt currently presents with decreased balance and activity tolerance, requiring MIN GUARD for standing UB ADLs, standing toileting (with urinal), and functional mobility of short household distances. At start of session, pt on 2L/min of O2 (SpO2 >94% during seated conversation). This date, pt was able to walk 75ft on 2L of O2, with unilateral UE support from IV pole, requiring one seated rest break at 66ft d/t SpO2 desat to 78% (able to recover to 92% after 30sec of seated rest and pursed lip breathing). Pt was able to walk the remaining 73ft with SpO2 remaining >90%. Pt performed standing toileting with urinal, with SpO2 desat 83% following standing for 1-2 mins, however, able to recover to 91% following initiation of pursed lip breathing. RN informed. Pt left in recliner on 2L/min O2 with SpO2 94% and pt in no acute distress.   Pt would benefit from additional skilled OT services to maximize return to PLOF and minimize risk of future falls, injury, caregiver burden, and  readmission. Upon discharge, recommend HHOT.     Follow Up Recommendations  Home health OT;Supervision - Intermittent    Equipment Recommendations  None recommended by OT       Precautions / Restrictions Precautions Precautions: Other (comment) Precaution Comments: Droplet precautions Restrictions Weight Bearing Restrictions: No      Mobility Bed Mobility               General bed mobility comments: not assessed, pt in recliner at beginning/end of session    Transfers Overall transfer level: Needs assistance   Transfers: Sit to/from Stand Sit to Stand: Supervision         General transfer comment: Verbal cues for safe hand placement    Balance Overall balance assessment: Needs assistance Sitting-balance support: No upper extremity supported;Feet supported Sitting balance-[name] Scale: Good     Standing balance support: Single extremity supported;During functional activity Standing balance-[name] Scale: Poor Standing balance comment: Pt seeking unilateral UE support from IV pole during functional mobility                           ADL either performed or assessed with clinical judgement   ADL Overall ADL's : Needs assistance/impaired                                     Functional mobility during ADLs: Min guard General ADL Comments: MIN GUARD for standing UB ADLs and for standing toileting  Vision Patient Visual Report: No change from baseline              Pertinent Vitals/Pain Pain Assessment: No/denies pain        Extremity/Trunk Assessment Upper Extremity Assessment Upper Extremity Assessment: Overall WFL for tasks assessed   Lower Extremity Assessment Lower Extremity Assessment: Overall WFL for tasks assessed   Cervical / Trunk Assessment Cervical / Trunk Assessment: Normal   Communication Communication Communication: No difficulties   Cognition Arousal/Alertness: Awake/alert Behavior During Therapy:  WFL for tasks assessed/performed Overall Cognitive Status: Within Functional Limits for tasks assessed                                     General Comments  Upon arrival to room, pt sitting upright in recliner on 2L/min of O2 (SpO2 >94% during seated conversation). Pt able to walk 61ft with unilateral UE support from IV pole, requiring one seated rest break at 54ft d/t SpO2 desat to 78% (able to recover to 92% following 30sec of seated rest and pursed lip breathing). Pt was able to walk the remaining 16ft with SpO2 remaining >90%. Pt performed standing toileting with urinal, with SpO2 desat 83% following standing for 1-2 mins, however, able to recover to 91% following initiation of pursed lip breathing    Exercises Other Exercises Other Exercises: Provided education on role of OT, POC, and d/c recommendations, with pt verbalizing understanding        Home Living Family/patient expects to be discharged to:: Private residence Living Arrangements: Spouse/significant other Available Help at Discharge: Family;Available 24 hours/day Type of Home: House Home Access: Stairs to enter [employer] of Steps: 4 Entrance Stairs-Rails: Right;Left Home Layout: One level     Bathroom Shower/Tub: Chief Strategy Officer: Standard     Home Equipment: Emergency planning/management officer - 2 wheels;Hand held shower head;Grab bars - tub/shower;Cane - single point          Prior Functioning/Environment Level of Independence: Independent        Comments: Independent without AD for functional mobility of short household distances. Works from home, cooks, drives, and orders groceries for delivery. O2 at night. Endorses 4 syncope events in past 6 months        OT Problem List: Decreased strength;Decreased activity tolerance;Impaired balance (sitting and/or standing);Cardiopulmonary status limiting activity      OT Treatment/Interventions: Self-care/ADL training;Therapeutic  exercise;Energy conservation;DME and/or AE instruction;Therapeutic activities;Patient/family education;Balance training    OT Goals(Current goals can be found in the care plan section) Acute Rehab OT Goals Patient Stated Goal: to improve breathing OT Goal Formulation: With patient Time For Goal Achievement: 07/28/20 Potential to Achieve Goals: Good ADL Goals Additional ADL Goal #1: pt will perform standing grooming & hygiene tasks for at least 5 mins following SET-UP assist Additional ADL Goal #2: pt will initiate at least 1 energy conservation strategy during functional mobility Additional ADL Goal #3: pt will perform sit>stand LB dressing with MIN GUARD  OT Frequency: Min 1X/week    AM-PAC OT "6 Clicks" Daily Activity     Outcome Measure Help from another person eating meals?: None Help from another person taking care of personal grooming?: None Help from another person toileting, which includes using toliet, bedpan, or urinal?: A Little Help from another person bathing (including washing, rinsing, drying)?: A Little Help from another person to put on and taking off regular upper body clothing?: None Help from  another person to put on and taking off regular lower body clothing?: A Little 6 Click Score: 21   End of Session Equipment Utilized During Treatment: Oxygen;Other (comment) (IV pole) Nurse Communication: Mobility status  Activity Tolerance: Patient tolerated treatment well Patient left: in chair;with call bell/phone within reach  OT Visit Diagnosis: Muscle weakness (generalized) (M62.81)                Time: 8453-6468 OT Time Calculation (min): 43 min Charges:  OT General Charges $OT Visit: 1 Visit OT Evaluation $OT Eval Moderate Complexity: 1 Mod OT Treatments $Therapeutic Activity: 23-37 mins  Matthew Folks, OTR/L [facility] [phone]

## 2020-07-15 LAB — GLUCOSE, CAPILLARY
Glucose-Capillary: 189 mg/dL — ABNORMAL HIGH (ref 70–99)
Glucose-Capillary: 209 mg/dL — ABNORMAL HIGH (ref 70–99)
Glucose-Capillary: 221 mg/dL — ABNORMAL HIGH (ref 70–99)
Glucose-Capillary: 228 mg/dL — ABNORMAL HIGH (ref 70–99)
Glucose-Capillary: 241 mg/dL — ABNORMAL HIGH (ref 70–99)
Glucose-Capillary: 279 mg/dL — ABNORMAL HIGH (ref 70–99)

## 2020-07-15 LAB — PROTIME-INR
INR: 2.9 — ABNORMAL HIGH (ref 0.8–1.2)
Prothrombin Time: 30 seconds — ABNORMAL HIGH (ref 11.4–15.2)

## 2020-07-15 MED ORDER — TAMSULOSIN HCL 0.4 MG PO CAPS
0.4000 mg | ORAL_CAPSULE | Freq: Once | ORAL | Status: AC
  Administered 2020-07-15: 0.4 mg via ORAL
  Filled 2020-07-15: qty 1

## 2020-07-15 MED ORDER — IPRATROPIUM-ALBUTEROL 0.5-2.5 (3) MG/3ML IN SOLN
3.0000 mL | Freq: Two times a day (BID) | RESPIRATORY_TRACT | Status: DC
  Administered 2020-07-16 – 2020-07-19 (×6): 3 mL via RESPIRATORY_TRACT
  Filled 2020-07-15 (×9): qty 3

## 2020-07-15 MED ORDER — TAMSULOSIN HCL 0.4 MG PO CAPS
0.8000 mg | ORAL_CAPSULE | Freq: Every day | ORAL | Status: DC
  Administered 2020-07-16 – 2020-07-19 (×4): 0.8 mg via ORAL
  Filled 2020-07-15 (×4): qty 2

## 2020-07-15 MED ORDER — WARFARIN SODIUM 4 MG PO TABS
4.0000 mg | ORAL_TABLET | Freq: Once | ORAL | Status: AC
  Administered 2020-07-15: 4 mg via ORAL
  Filled 2020-07-15: qty 1

## 2020-07-15 NOTE — Progress Notes (Signed)
Nutrition Brief Note  RD consulted for assessment of nutritional requirements/ needs due to COPD GOLD protocol.   Wt Readings from Last 15 Encounters:  07/13/20 112.2 kg  01/21/20 112.2 kg  01/09/20 120.7 kg  04/11/19 108.4 kg  04/04/19 108.4 kg  03/28/19 108.4 kg  03/15/19 108.8 kg  12/27/18 111.1 kg  11/20/18 111.1 kg  02/03/18 108.8 kg  10/04/17 103.6 kg  02/12/17 108.9 kg  08/16/16 107 kg   65 y.o. male with medical history significant of COPD, CHF, CAD sp CABG, HTN, PE on coumadin, DM type II, RBBB, OSA on oxygen at night.  Pt admitted with COPD exacerbation.   Reviewed I/O's: -1.4 L x 24 hours  UOP: 2 L x 24 hours  Pt unavailable at time of visit. Attempted to speak with pt via call to hospital room phone, however, unable to reach. RD unable to obtain further nutrition-related history or complete nutrition-focused physical exam at this time.   Lab Results  Component Value Date   HGBA1C 7.7 (H) 07/14/2020  PTA DM medications are ozempic, however, pt not taking due to inability to afford. Per DM coordinator notes, recommending metformin at discharge.    Labs reviewed: CBGS: 189-254 (inpatient orders for glycemic control are 0-9 units insulin aspart every 4 hours).   Body mass index is 38.74 kg/m. Patient meets criteria for obesity, class II based on current BMI. Obesity is a complex, chronic medical condition that is optimally managed by a multidisciplinary care team. Weight loss is not an ideal goal for an acute inpatient hospitalization. However, if further work-up for obesity is warranted, consider outpatient referral to outpatient bariatric service and/or [facility].   Current diet order is carb modified, patient is consuming approximately 100% of meals at this time. Labs and medications reviewed.   No nutrition interventions warranted at this time. If nutrition issues arise, please consult RD.   Levada Schilling, RD, LDN,  CDCES Registered Dietitian II Certified Diabetes Care and Education Specialist Please refer to [facility] for RD and/or RD on-call/weekend/after hours pager

## 2020-07-15 NOTE — Evaluation (Signed)
Physical Therapy Evaluation Patient Details Name: Ryan Ogborn MRN: [id number] DOB: Jun 30, 1955 Today's Date: 07/15/2020   History of Present Illness  Pt is a 65 yo male admitted to [facility] on 07/13/20 for c/o worsening SOB over the past 10 days. Pt hypoxic with SpO2 in 70's upon arrival; pt states he does not currently use O2 at home during the day. PMH significant for: T2DM, COPD, hx PE, CAD, CHF, RBBB and HTN.  MD assessment includes: COPD exacerbation, acute on chronic hypoxic respiratory failure, and prolonged QT interval likely due to right bundle branch block.    Clinical Impression  Pt was pleasant and motivated to participate during the session.  Pt required extra time and effort with functional tasks but no physical assistance.  Pt's SpO2 dropped to the mid 80s during ambulation on 2LO2/min but quickly returned to pt's pre-amb baseline of the mid 90s upon returning to sitting.  Pt reported feeling weaker with decreased activity tolerance compared to his baseline and is at risk for further functional decline in the setting of COPD with decreasing activity levels. Pulmonary Rehab discussed with pt with pt reporting being familiar with the program but not feeling like he could tolerate it at this point. Pt will benefit from HHPT upon discharge to safely address deficits listed in patient problem list for decreased caregiver assistance and eventual return to PLOF with hopeful transition to pulmonary rehab as approrpriate.      Follow Up Recommendations Home health PT;Supervision - Intermittent    Equipment Recommendations  None recommended by PT    Recommendations for Other Services       Precautions / Restrictions Precautions Precautions: None Restrictions Weight Bearing Restrictions: No Other Position/Activity Restrictions: Keep SpO2 > 92%      Mobility  Bed Mobility               General bed mobility comments: not assessed, pt in recliner at beginning/end of session     Transfers Overall transfer level: Modified independent   Transfers: Sit to/from Stand Sit to Stand: Modified independent (Device/Increase time)         General transfer comment: Good eccentric and concentric control and stability  Ambulation/Gait Ambulation/Gait assistance: Modified independent (Device/Increase time) Gait Distance (Feet): 40 Feet Assistive device: IV Pole Gait Pattern/deviations: Step-through pattern;Decreased step length - right;Decreased step length - left Gait velocity: decreased   General Gait Details: Slow cadence but steady without LOB; SpO2 dropped to the mid 80s on 2LO2/min but increased back to the mid 90s quickly upon returning to sitting  Stairs            Wheelchair Mobility    Modified Rankin (Stroke Patients Only)       Balance Overall balance assessment: Needs assistance   Sitting balance-[name] Scale: Normal     Standing balance support: Single extremity supported Standing balance-[name] Scale: Good Standing balance comment: Slow cadence but steady during amb with IV pole                             Pertinent Vitals/Pain Pain Assessment: No/denies pain    Home Living Family/patient expects to be discharged to:: Private residence Living Arrangements: Spouse/significant other Available Help at Discharge: Family;Available 24 hours/day Type of Home: House Home Access: Stairs to enter Entrance Stairs-Rails: Right Entrance Stairs-Number of Steps: 4 Home Layout: One level Home Equipment: Emergency planning/management officer - 2 wheels;Hand held shower head;Grab bars - tub/shower;Cane - single point  Prior Function Level of Independence: Independent         Comments: Independent amb without AD household distances and occasional very limited community distances. Works from home, cooks, drives, and orders groceries for delivery. O2 at night. Endorses 4 syncope events in past 6 months.     Hand Dominance   Dominant Hand:  Left    Extremity/Trunk Assessment   Upper Extremity Assessment Upper Extremity Assessment: Overall WFL for tasks assessed    Lower Extremity Assessment Lower Extremity Assessment: Overall WFL for tasks assessed       Communication   Communication: No difficulties  Cognition Arousal/Alertness: Awake/alert Behavior During Therapy: WFL for tasks assessed/performed Overall Cognitive Status: Within Functional Limits for tasks assessed                                        General Comments      Exercises Total Joint Exercises Ankle Circles/Pumps: AROM;Strengthening;Both;10 reps Quad Sets: Strengthening;Both;10 reps Gluteal Sets: 10 reps;Both;Strengthening Long Arc Quad: AROM;Strengthening;Both;10 reps Other Exercises Other Exercises: Pt education provided on RPE to guide activity progression, dyspnea spiral, physiological benefits of activity, and energy conservation   Assessment/Plan    PT Assessment Patient needs continued PT services  PT Problem List Decreased activity tolerance;Cardiopulmonary status limiting activity       PT Treatment Interventions DME instruction;Gait training;Stair training;Functional mobility training;Therapeutic activities;Therapeutic exercise;Balance training;Patient/family education    PT Goals (Current goals can be found in the Care Plan section)  Acute Rehab PT Goals Patient Stated Goal: Improved strength and endurance PT Goal Formulation: With patient Time For Goal Achievement: 07/28/20 Potential to Achieve Goals: Fair    Frequency Min 2X/week   Barriers to discharge        Co-evaluation               AM-PAC PT "6 Clicks" Mobility  Outcome Measure Help needed turning from your back to your side while in a flat bed without using bedrails?: None Help needed moving from lying on your back to sitting on the side of a flat bed without using bedrails?: None Help needed moving to and from a bed to a chair  (including a wheelchair)?: None Help needed standing up from a chair using your arms (e.g., wheelchair or bedside chair)?: None Help needed to walk in hospital room?: A Little Help needed climbing 3-5 steps with a railing? : A Little 6 Click Score: 22    End of Session Equipment Utilized During Treatment: Gait belt;Oxygen Activity Tolerance: Patient tolerated treatment well Patient left: in chair;with call bell/phone within reach Nurse Communication: Mobility status PT Visit Diagnosis: Difficulty in walking, not elsewhere classified (R26.2)    Time: 3545-6256 PT Time Calculation (min) (ACUTE ONLY): 47 min   Charges:   PT Evaluation $PT Eval Moderate Complexity: 1 Mod PT Treatments $Therapeutic Activity: 8-22 mins        D. Scott Nadir Vasques PT, DPT 07/15/20, 12:02 PM

## 2020-07-15 NOTE — Progress Notes (Addendum)
ANTICOAGULATION CONSULT NOTE  Pharmacy Consult for Warfarin  Indication: continuation of therapy for VTE  Patient Measurements: Height: 5\' 7"  (170.2 cm) Weight: 112.2 kg (247 lb 5.7 oz) IBW/kg (Calculated) : 66.1  Labs: Recent Labs    07/13/20 1632 07/14/20 0500 07/14/20 0600 07/15/20 0539  HGB 13.3  --  13.1  --   HCT 42.3  --  42.0  --   PLT 230  --  212  --   APTT 70*  --   --   --   LABPROT 24.8* 25.4*  --  30.0*  INR 2.2* 2.3*  --  2.9*  CREATININE 0.73  --  0.63  --   TROPONINIHS 7  --   --   --     Estimated Creatinine Clearance: 110 mL/min (by C-G formula based on SCr of 0.63 mg/dL).   Medical History: Past Medical History:  Diagnosis Date  . Antiphospholipid antibody syndrome (HCC)   . CAD S/P percutaneous coronary angioplasty '98, '04, Feb 2017   [facility]  . Carotid arterial disease (HCC) 10/2015   moderate bilateral  . COPD (chronic obstructive pulmonary disease) (HCC)   . Coronary artery disease   . Hypertension   . Non-insulin treated type 2 diabetes mellitus (HCC)   . Pulmonary emboli (HCC) 2011  . RBBB   . Sleep apnea    Assessment: Patient is a 65yo male admitted with shortness of breath. 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   Home Dose: 7mg  on Monday and Friday                       6mg  all other days  INR on Admission: 2.2   DDI: Ceftriaxone (increase INR), duloxetine (increase risk of bleeding), asa (increase risk of bleeding), torsemide (increase warfarin concentration)  Date INR Warfarin Dose  5/1 2.3 6 mg  5/2 2.9 4 mg      Goal of Therapy:  INR 2-3   Plan:  Patient is therapeutic on home regimen but on the upper limit of goal. Will order Warfarin 4mg  dose for this evening (~40% decrease from home dose). Daily INR ordered. CBC at least every 3 days.   Saturday, PharmD, BCPS 07/15/2020 8:29 AM

## 2020-07-15 NOTE — Progress Notes (Signed)
Progress Note    Jose [name]  UJW:[id number] DOB: [birth year]  DOA: 07/13/2020 PCP: [name], MD      Brief Narrative:    Medical records reviewed and are as summarized below:  Jose [name] is a 65 y.o. male with medical history significant for recent hospitalization for pneumonia in November 2021, COPD, chronic diastolic CHF, CAD s/p PCI with stent, antiphospholipid syndrome, history of pulmonary embolism on Coumadin, type 2 diabetes mellitus, obstructive sleep apnea on CPAP at night, chronic hypoxic and hypercapnic respiratory failure on 2 L/min oxygen at night and BiPAP at night (recently converted to CPAP), carotid artery disease, right bundle branch block, morbid obesity, hyperlipidemia.  He presented to the hospital because of shortness of breath, wheezing and hypoxia.  He said his oxygen saturation was 76% on room air.  He was admitted to the hospital for COPD exacerbation and acute on chronic hypoxic respiratory failure.  He was treated with steroids, antibiotics and bronchodilators.    Assessment/Plan:   Active Problems:   Tobacco abuse   Anti-phospholipid syndrome (HCC)   Hyperlipidemia   Sleep apnea   History of pulmonary embolus (PE)-2011   Non-insulin treated type 2 diabetes mellitus (HCC)   RBBB (right bundle branch block)   Coronary atherosclerosis   GERD (gastroesophageal reflux disease)   Hypertension   COPD exacerbation (HCC)   Prolonged QT interval   Acute on chronic respiratory failure with hypoxia (HCC)   Chronic diastolic CHF (congestive heart failure) (HCC)    Body mass index is 38.74 kg/m.  (Morbid obesity)   COPD exacerbation: IV Solu-Medrol changed to prednisone per protocol.  Continue bronchodilators and IV Rocephin.  Acute on chronic hypoxic respiratory failure, chronic hypercapnic respiratory failure: Continue 2 L/min oxygen via nasal cannula.  Continue CPAP at night.  He may need home oxygen during the day as well.  Chronic  diastolic CHF: Continue torsemide  History of pulmonary embolism in 2011, antiphospholipid syndrome: Continue Coumadin  CAD s/p PCI with stent: Continue aspirin and statins  Type II DM: Ozempic on hold.  NovoLog as needed for hyperglycemia.  Prolonged QT interval likely due to right bundle branch block.  Diet Order            Diet Carb Modified Fluid consistency: Thin; Room service appropriate? Yes  Diet effective now                    Consultants:  None  Procedures:  None    Medications:   . aspirin EC  81 mg Oral Daily  . DULoxetine  60 mg Oral Daily  . fluticasone furoate-vilanterol  1 puff Inhalation Daily   And  . umeclidinium bromide  1 puff Inhalation Daily  . gabapentin  600 mg Oral TID  . insulin aspart  0-9 Units Subcutaneous Q4H  . ipratropium-albuterol  3 mL Nebulization Q6H  . metoprolol tartrate  12.5 mg Oral BID  . nicotine  21 mg Transdermal Daily  . pantoprazole  40 mg Oral BID  . predniSONE  40 mg Oral Q breakfast  . rosuvastatin  10 mg Oral QPM  . sodium chloride flush  3 mL Intravenous Q12H  . [START ON 07/16/2020] tamsulosin  0.8 mg Oral Daily  . topiramate  50 mg Oral BID  . torsemide  20 mg Oral Daily  . warfarin  4 mg Oral ONCE-1600  . Warfarin - Pharmacist Dosing Inpatient   Does not apply q1600   Continuous Infusions: .  sodium chloride    . cefTRIAXone (ROCEPHIN)  IV 1 g (07/15/20 0546)     Anti-infectives (From admission, onward)   Start     Dose/Rate Route Frequency Ordered Stop   07/14/20 0645  cefTRIAXone (ROCEPHIN) 1 g in sodium chloride 0.9 % 100 mL IVPB        1 g 200 mL/hr over 30 Minutes Intravenous Every 24 hours 07/14/20 0549 07/19/20 0644             Family Communication/Anticipated D/C date and plan/Code Status   DVT prophylaxis:  warfarin (COUMADIN) tablet 4 mg     Code Status: DNR  Family Communication: None Disposition Plan:    Status is: Inpatient  Remains inpatient appropriate because:IV  treatments appropriate due to intensity of illness or inability to take PO and Inpatient level of care appropriate due to severity of illness   Dispo: The patient is from: Home              Anticipated d/c is to: Home              Patient currently is not medically stable to d/c.   Difficult to place patient No           Subjective:   C/o shortness of breath and wheezing.  Objective:    Vitals:   07/15/20 0041 07/15/20 0430 07/15/20 0749 07/15/20 1110  BP:  136/79 (!) 157/90 114/74  Pulse:  76 73 68  Resp:  19 (!) 23 19  Temp:  97.8 F (36.6 C) 98.7 F (37.1 C) 98.6 F (37 C)  TempSrc:  Oral Oral Oral  SpO2: 93% 96% 96% 97%  Weight:      Height:       No data found.   Intake/Output Summary (Last 24 hours) at 07/15/2020 1357 Last data filed at 07/15/2020 1113 Gross per 24 hour  Intake 821.33 ml  Output 3100 ml  Net -2278.67 ml   Filed Weights   07/13/20 1629  Weight: 112.2 kg    Exam:  GEN: NAD SKIN: Chronic multiple brownish round and oval shaped plaque-like lesions on the back and chest. Chronic skin changes on legs likely from chronic venous insufficiency EYES: No pallor or icterus ENT: MMM CV: RRR PULM: B/l wheezing. No rales heard ABD: soft, ND, NT, +BS CNS: AAO x 3, non focal EXT: No edema or tenderness          Data Reviewed:   I have personally reviewed following labs and imaging studies:  Labs: Labs show the following:   Basic Metabolic Panel: Recent Labs  Lab 07/13/20 1632 07/14/20 0600  NA 135 140  K 4.4 4.0  CL 100 102  CO2 28 30  GLUCOSE 124* 183*  BUN 17 14  CREATININE 0.73 0.63  CALCIUM 8.4* 8.7*  MG 2.0 2.3  PHOS  --  3.9   GFR Estimated Creatinine Clearance: 110 mL/min (by C-G formula based on SCr of 0.63 mg/dL). Liver Function Tests: Recent Labs  Lab 07/13/20 1632 07/14/20 0600  AST 38 27  ALT 36 32  ALKPHOS 68 60  BILITOT 0.6 0.5  PROT 7.8 7.7  ALBUMIN 3.6 3.5   No results for input(s): LIPASE,  AMYLASE in the last 168 hours. No results for input(s): AMMONIA in the last 168 hours. Coagulation profile Recent Labs  Lab 07/13/20 1632 07/14/20 0500 07/15/20 0539  INR 2.2* 2.3* 2.9*    CBC: Recent Labs  Lab 07/13/20 1632 07/14/20 0600  WBC  11.4* 7.3  NEUTROABS 6.1 4.7  HGB 13.3 13.1  HCT 42.3 42.0  MCV 92.6 92.9  PLT 230 212   Cardiac Enzymes: No results for input(s): CKTOTAL, CKMB, CKMBINDEX, TROPONINI in the last 168 hours. BNP (last 3 results) No results for input(s): PROBNP in the last 8760 hours. CBG: Recent Labs  Lab 07/14/20 2028 07/14/20 2355 07/15/20 0432 07/15/20 0748 07/15/20 1115  GLUCAP 266* 254* 209* 189* 221*   D-Dimer: No results for input(s): DDIMER in the last 72 hours. Hgb A1c: Recent Labs    07/14/20 0500  HGBA1C 7.7*   Lipid Profile: No results for input(s): CHOL, HDL, LDLCALC, TRIG, CHOLHDL, LDLDIRECT in the last 72 hours. Thyroid function studies: Recent Labs    07/14/20 0600  TSH 0.327*   Anemia work up: No results for input(s): VITAMINB12, FOLATE, FERRITIN, TIBC, IRON, RETICCTPCT in the last 72 hours. Sepsis Labs: Recent Labs  Lab 07/13/20 1632 07/14/20 0500 07/14/20 0600  PROCALCITON <0.10  --   --   WBC 11.4*  --  7.3  LATICACIDVEN 0.9 1.4  --     Microbiology Recent Results (from the past 240 hour(s))  Resp Panel by RT-PCR (Flu A&B, Covid) Nasopharyngeal Swab     Status: None   Collection Time: 07/13/20  4:32 PM   Specimen: Nasopharyngeal Swab; Nasopharyngeal(NP) swabs in vial transport medium  Result Value Ref Range Status   SARS Coronavirus 2 by RT PCR NEGATIVE NEGATIVE Final    Comment: (NOTE) SARS-CoV-2 target nucleic acids are NOT DETECTED.  The SARS-CoV-2 RNA is generally detectable in upper respiratory specimens during the acute phase of infection. The lowest concentration of SARS-CoV-2 viral copies this assay can detect is 138 copies/mL. A negative result does not preclude SARS-Cov-2 infection and  should not be used as the sole basis for treatment or other patient management decisions. A negative result may occur with  improper specimen collection/handling, submission of specimen other than nasopharyngeal swab, presence of viral mutation(s) within the areas targeted by this assay, and inadequate number of viral copies(<138 copies/mL). A negative result must be combined with clinical observations, patient history, and epidemiological information. The expected result is Negative.  Fact Sheet for Patients:  BloggerCourse.comhttps://www.fda.gov/media/152166/download  Fact Sheet for Healthcare Providers:  SeriousBroker.ithttps://www.fda.gov/media/152162/download  This test is no t yet approved or cleared by the [place] FDA and  has been authorized for detection and/or diagnosis of SARS-CoV-2 by FDA under an Emergency Use Authorization (EUA). This EUA will remain  in effect (meaning this test can be used) for the duration of the COVID-19 declaration under Section 564(b)(1) of the Act, 21 U.S.C.section 360bbb-3(b)(1), unless the authorization is terminated  or revoked sooner.       Influenza A by PCR NEGATIVE NEGATIVE Final   Influenza B by PCR NEGATIVE NEGATIVE Final    Comment: (NOTE) The Xpert Xpress SARS-CoV-2/FLU/RSV plus assay is intended as an aid in the diagnosis of influenza from Nasopharyngeal swab specimens and should not be used as a sole basis for treatment. Nasal washings and aspirates are unacceptable for Xpert Xpress SARS-CoV-2/FLU/RSV testing.  Fact Sheet for Patients: BloggerCourse.comhttps://www.fda.gov/media/152166/download  Fact Sheet for Healthcare Providers: SeriousBroker.ithttps://www.fda.gov/media/152162/download  This test is not yet approved or cleared by the [place] FDA and has been authorized for detection and/or diagnosis of SARS-CoV-2 by FDA under an Emergency Use Authorization (EUA). This EUA will remain in effect (meaning this test can be used) for the duration of the COVID-19 declaration  under Section 564(b)(1) of the Act, 21  U.S.C. section 360bbb-3(b)(1), unless the authorization is terminated or revoked.  Performed at [facility], [address] [place], [place] [address]   Blood culture (routine single)     Status: None (Preliminary result)   Collection Time: 07/13/20  5:23 PM   Specimen: BLOOD  Result Value Ref Range Status   Specimen Description BLOOD RIGHT ANTECUBITAL  Final   Special Requests   Final    BOTTLES DRAWN AEROBIC AND ANAEROBIC Blood Culture adequate volume   Culture   Final    NO GROWTH 2 DAYS Performed at [facility], [address], [place], [place] [address]    Report Status PENDING  Incomplete    Procedures and diagnostic studies:  CT Angio Chest PE W and/or Wo Contrast  Result Date: 07/13/2020 CLINICAL DATA:  Increasing shortness of breath for 10 days, COPD EXAM: CT ANGIOGRAPHY CHEST WITH CONTRAST TECHNIQUE: Multidetector CT imaging of the chest was performed using the standard protocol during bolus administration of intravenous contrast. Multiplanar CT image reconstructions and MIPs were obtained to evaluate the vascular anatomy. CONTRAST:  57mL OMNIPAQUE IOHEXOL 350 MG/ML SOLN COMPARISON:  07/13/2020, 12/27/2018 FINDINGS: Cardiovascular: This is a technically adequate evaluation of the pulmonary vasculature. There are no filling defects or pulmonary emboli. The heart is unremarkable without pericardial effusion. No evidence of thoracic aortic aneurysm or dissection. Postsurgical changes are seen from median sternotomy and coronary artery bypass. Mild atherosclerosis of the aortic arch. Extensive atherosclerosis of the native coronary vessels. Moderate calcification of the aortic valve. Mediastinum/Nodes: No pathologically enlarged mediastinal, hilar, or axillary adenopathy. Thyroid gland, trachea, and esophagus demonstrate no significant findings. Lungs/Pleura: No acute airspace disease, effusion, or pneumothorax. Minimal  scarring within the right lower lobe. Central airways are patent. Upper Abdomen: No acute abnormality. Musculoskeletal: No acute or destructive bony lesions. Reconstructed images demonstrate no additional findings. Review of the MIP images confirms the above findings. IMPRESSION: 1. No evidence of pulmonary embolus. 2. No acute intrathoracic process. 3.  Aortic Atherosclerosis (ICD10-I70.0). Electronically Signed   By: Sharlet Salina M.D.   On: 07/13/2020 19:25   DG Chest Portable 1 View  Result Date: 07/13/2020 CLINICAL DATA:  Shortness of breath and cough. EXAM: PORTABLE CHEST 1 VIEW COMPARISON:  01/18/2020 FINDINGS: Cardiomegaly and CABG changes again noted. Mild pulmonary vascular congestion is present. There is no evidence of focal airspace disease, pulmonary edema, suspicious pulmonary nodule/mass, pleural effusion, or pneumothorax. No acute bony abnormalities are identified. IMPRESSION: Cardiomegaly with mild pulmonary vascular congestion. Electronically Signed   By: Harmon Pier M.D.   On: 07/13/2020 17:08               LOS: 2 days   Camdon Saetern  [facility]   Pager on [email or web address]. If 7PM-7AM, please contact night-coverage at [email or web address]     07/15/2020, 1:57 PM

## 2020-07-15 NOTE — Progress Notes (Signed)
Inpatient Diabetes Program Recommendations  AACE/ADA: New Consensus Statement on Inpatient Glycemic Control (2015)  Target Ranges:  Prepandial:   less than 140 mg/dL      Peak postprandial:   less than 180 mg/dL (1-2 hours)      Critically ill patients:  140 - 180 mg/dL   Lab Results  Component Value Date   GLUCAP 189 (H) 07/15/2020   HGBA1C 7.7 (H) 07/14/2020    Review of Glycemic Control Results for UMER, HARIG (MRN [id number]) as of 07/15/2020 10:52  Ref. Range 07/14/2020 13:53 07/14/2020 16:21 07/14/2020 20:28 07/14/2020 23:55 07/15/2020 04:32 07/15/2020 07:48  Glucose-Capillary Latest Ref Range: 70 - 99 mg/dL 132 (H) 440 (H) 102 (H) 254 (H) 209 (H) 189 (H)   Diabetes history: DM2 Outpatient Diabetes medications: Ozempic (not taking due to cost) Current orders for Inpatient glycemic control Novolog 0-9 units q 4 hrs. correction  Inpatient Diabetes Program Recommendations:   Noted patient has not been able to afford Ozempic per note. Consider Metformin on discharge which is only $4 @ [facility].  Thank you, Billy Fischer. Nadeen Shipman, RN, MSN, CDE  Diabetes Coordinator Inpatient Glycemic Control Team Team Pager [phone] (8am-5pm) 07/15/2020 11:04 AM

## 2020-07-16 LAB — GLUCOSE, CAPILLARY
Glucose-Capillary: 169 mg/dL — ABNORMAL HIGH (ref 70–99)
Glucose-Capillary: 145 mg/dL — ABNORMAL HIGH (ref 70–99)
Glucose-Capillary: 269 mg/dL — ABNORMAL HIGH (ref 70–99)
Glucose-Capillary: 370 mg/dL — ABNORMAL HIGH (ref 70–99)
Glucose-Capillary: 257 mg/dL — ABNORMAL HIGH (ref 70–99)

## 2020-07-16 LAB — PROTIME-INR
INR: 2.8 — ABNORMAL HIGH (ref 0.8–1.2)
Prothrombin Time: 29.4 seconds — ABNORMAL HIGH (ref 11.4–15.2)

## 2020-07-16 MED ORDER — FUROSEMIDE 10 MG/ML IJ SOLN
20.0000 mg | Freq: Once | INTRAMUSCULAR | Status: AC
  Administered 2020-07-16: 20 mg via INTRAVENOUS
  Filled 2020-07-16: qty 2

## 2020-07-16 MED ORDER — WARFARIN SODIUM 4 MG PO TABS
4.0000 mg | ORAL_TABLET | Freq: Once | ORAL | Status: AC
  Administered 2020-07-16: 4 mg via ORAL
  Filled 2020-07-16: qty 1

## 2020-07-16 NOTE — Progress Notes (Signed)
Progress Note    Betzalel Umbarger  PRF:[id number] DOB: 06-13-1955  DOA: 07/13/2020 PCP: [name], MD      Brief Narrative:    Medical records reviewed and are as summarized below:  Deyvi Bonanno is a 65 y.o. male with medical history significant for recent hospitalization for pneumonia in November 2021, COPD, chronic diastolic CHF, CAD s/p PCI with stent, antiphospholipid syndrome, history of pulmonary embolism on Coumadin, type 2 diabetes mellitus, obstructive sleep apnea on CPAP at night, chronic hypoxic and hypercapnic respiratory failure on 2 L/min oxygen at night and BiPAP at night (recently converted to CPAP), carotid artery disease, right bundle branch block, morbid obesity, hyperlipidemia.  He presented to the hospital because of shortness of breath, wheezing and hypoxia.  He said his oxygen saturation was 76% on room air.  He was admitted to the hospital for COPD exacerbation and acute on chronic hypoxic respiratory failure.  He was treated with steroids, antibiotics and bronchodilators.    Assessment/Plan:   Active Problems:   Tobacco abuse   Anti-phospholipid syndrome (HCC)   Hyperlipidemia   Sleep apnea   History of pulmonary embolus (PE)-2011   Non-insulin treated type 2 diabetes mellitus (HCC)   RBBB (right bundle branch block)   Coronary atherosclerosis   GERD (gastroesophageal reflux disease)   Hypertension   COPD exacerbation (HCC)   Prolonged QT interval   Acute on chronic respiratory failure with hypoxia (HCC)   Chronic diastolic CHF (congestive heart failure) (HCC)    Body mass index is 38.15 kg/m.  (Morbid obesity)   COPD exacerbation: Continue prednisone, bronchodilators and IV Rocephin.  Consulted Dr. Karna Christmas, pulmonologist to assist with management.  Acute on chronic hypoxic respiratory failure, chronic hypercapnic respiratory failure: Oxygen saturation dropped to 83% on room air at rest.  Continue 2 L/min oxygen via nasal cannula.   Continue CPAP at night.  He may need home oxygen during the day as well.  Chronic diastolic CHF: IV Lasix 20 mg x 1 dose was ordered today.  Continue torsemide  History of pulmonary embolism in 2011, antiphospholipid syndrome: Continue Coumadin  CAD s/p PCI with stent: Continue aspirin and statins  Type II DM: Ozempic on hold.  NovoLog as needed for hyperglycemia.  Prolonged QT interval likely due to right bundle branch block.  Diet Order            Diet Carb Modified Fluid consistency: Thin; Room service appropriate? Yes  Diet effective now                    Consultants:  None  Procedures:  None    Medications:   . aspirin EC  81 mg Oral Daily  . DULoxetine  60 mg Oral Daily  . fluticasone furoate-vilanterol  1 puff Inhalation Daily   And  . umeclidinium bromide  1 puff Inhalation Daily  . gabapentin  600 mg Oral TID  . insulin aspart  0-9 Units Subcutaneous Q4H  . ipratropium-albuterol  3 mL Nebulization BID  . metoprolol tartrate  12.5 mg Oral BID  . nicotine  21 mg Transdermal Daily  . pantoprazole  40 mg Oral BID  . predniSONE  40 mg Oral Q breakfast  . rosuvastatin  10 mg Oral QPM  . sodium chloride flush  3 mL Intravenous Q12H  . tamsulosin  0.8 mg Oral Daily  . topiramate  50 mg Oral BID  . torsemide  20 mg Oral Daily  . warfarin  4  mg Oral ONCE-1600  . Warfarin - Pharmacist Dosing Inpatient   Does not apply q1600   Continuous Infusions: . sodium chloride 250 mL (07/16/20 0455)  . cefTRIAXone (ROCEPHIN)  IV 1 g (07/16/20 0457)     Anti-infectives (From admission, onward)   Start     Dose/Rate Route Frequency Ordered Stop   07/14/20 0645  cefTRIAXone (ROCEPHIN) 1 g in sodium chloride 0.9 % 100 mL IVPB        1 g 200 mL/hr over 30 Minutes Intravenous Every 24 hours 07/14/20 0549 07/19/20 0644             Family Communication/Anticipated D/C date and plan/Code Status   DVT prophylaxis:  warfarin (COUMADIN) tablet 4 mg     Code  Status: DNR  Family Communication: None Disposition Plan:    Status is: Inpatient  Remains inpatient appropriate because:IV treatments appropriate due to intensity of illness or inability to take PO and Inpatient level of care appropriate due to severity of illness   Dispo: The patient is from: Home              Anticipated d/c is to: Home              Patient currently is not medically stable to d/c.   Difficult to place patient No           Subjective:   C/o wheezing and shortness of breath.  Shortness of breath is worse with mild exertion, even walking from the chair to the sink in the room.  Objective:    Vitals:   07/16/20 0435 07/16/20 0726 07/16/20 0848 07/16/20 1213  BP: 118/72 127/87  130/77  Pulse: 65 62 78 64  Resp: (!) 24 18  19   Temp: 98 F (36.7 C) 98.1 F (36.7 C)  98.2 F (36.8 C)  TempSrc: Oral Oral  Oral  SpO2: 95% 96%  98%  Weight:  110.5 kg    Height:       No data found.   Intake/Output Summary (Last 24 hours) at 07/16/2020 1227 Last data filed at 07/16/2020 1156 Gross per 24 hour  Intake 1181.47 ml  Output 2775 ml  Net -1593.53 ml   Filed Weights   07/13/20 1629 07/16/20 0726  Weight: 112.2 kg 110.5 kg    Exam:  GEN: NAD SKIN: Chronic multiple brownish round and oval shaped plaque-like lesions on the back and chest. Chronic skin changes on legs likely from chronic venous insufficiency EYES: No pallor or icterus ENT: MMM CV: RRR PULM: Bilateral wheezing and bibasilar rales ABD: soft, obese, NT, +BS CNS: AAO x 3, non focal EXT: Trace bilateral leg edema.  No tenderness          Data Reviewed:   I have personally reviewed following labs and imaging studies:  Labs: Labs show the following:   Basic Metabolic Panel: Recent Labs  Lab 07/13/20 1632 07/14/20 0600  NA 135 140  K 4.4 4.0  CL 100 102  CO2 28 30  GLUCOSE 124* 183*  BUN 17 14  CREATININE 0.73 0.63  CALCIUM 8.4* 8.7*  MG 2.0 2.3  PHOS  --  3.9    GFR Estimated Creatinine Clearance: 109.2 mL/min (by C-G formula based on SCr of 0.63 mg/dL). Liver Function Tests: Recent Labs  Lab 07/13/20 1632 07/14/20 0600  AST 38 27  ALT 36 32  ALKPHOS 68 60  BILITOT 0.6 0.5  PROT 7.8 7.7  ALBUMIN 3.6 3.5   No results  for input(s): LIPASE, AMYLASE in the last 168 hours. No results for input(s): AMMONIA in the last 168 hours. Coagulation profile Recent Labs  Lab 07/13/20 1632 07/14/20 0500 07/15/20 0539 07/16/20 0424  INR 2.2* 2.3* 2.9* 2.8*    CBC: Recent Labs  Lab 07/13/20 1632 07/14/20 0600  WBC 11.4* 7.3  NEUTROABS 6.1 4.7  HGB 13.3 13.1  HCT 42.3 42.0  MCV 92.6 92.9  PLT 230 212   Cardiac Enzymes: No results for input(s): CKTOTAL, CKMB, CKMBINDEX, TROPONINI in the last 168 hours. BNP (last 3 results) No results for input(s): PROBNP in the last 8760 hours. CBG: Recent Labs  Lab 07/15/20 1557 07/15/20 1953 07/15/20 2348 07/16/20 0446 07/16/20 0724  GLUCAP 241* 279* 228* 169* 145*   D-Dimer: No results for input(s): DDIMER in the last 72 hours. Hgb A1c: Recent Labs    07/14/20 0500  HGBA1C 7.7*   Lipid Profile: No results for input(s): CHOL, HDL, LDLCALC, TRIG, CHOLHDL, LDLDIRECT in the last 72 hours. Thyroid function studies: Recent Labs    07/14/20 0600  TSH 0.327*   Anemia work up: No results for input(s): VITAMINB12, FOLATE, FERRITIN, TIBC, IRON, RETICCTPCT in the last 72 hours. Sepsis Labs: Recent Labs  Lab 07/13/20 1632 07/14/20 0500 07/14/20 0600  PROCALCITON <0.10  --   --   WBC 11.4*  --  7.3  LATICACIDVEN 0.9 1.4  --     Microbiology Recent Results (from the past 240 hour(s))  Resp Panel by RT-PCR (Flu A&B, Covid) Nasopharyngeal Swab     Status: None   Collection Time: 07/13/20  4:32 PM   Specimen: Nasopharyngeal Swab; Nasopharyngeal(NP) swabs in vial transport medium  Result Value Ref Range Status   SARS Coronavirus 2 by RT PCR NEGATIVE NEGATIVE Final    Comment:  (NOTE) SARS-CoV-2 target nucleic acids are NOT DETECTED.  The SARS-CoV-2 RNA is generally detectable in upper respiratory specimens during the acute phase of infection. The lowest concentration of SARS-CoV-2 viral copies this assay can detect is 138 copies/mL. A negative result does not preclude SARS-Cov-2 infection and should not be used as the sole basis for treatment or other patient management decisions. A negative result may occur with  improper specimen collection/handling, submission of specimen other than nasopharyngeal swab, presence of viral mutation(s) within the areas targeted by this assay, and inadequate number of viral copies(<138 copies/mL). A negative result must be combined with clinical observations, patient history, and epidemiological information. The expected result is Negative.  Fact Sheet for Patients:  BloggerCourse.com  Fact Sheet for Healthcare Providers:  SeriousBroker.it  This test is no t yet approved or cleared by the [place] FDA and  has been authorized for detection and/or diagnosis of SARS-CoV-2 by FDA under an Emergency Use Authorization (EUA). This EUA will remain  in effect (meaning this test can be used) for the duration of the COVID-19 declaration under Section 564(b)(1) of the Act, 21 U.S.C.section 360bbb-3(b)(1), unless the authorization is terminated  or revoked sooner.       Influenza A by PCR NEGATIVE NEGATIVE Final   Influenza B by PCR NEGATIVE NEGATIVE Final    Comment: (NOTE) The Xpert Xpress SARS-CoV-2/FLU/RSV plus assay is intended as an aid in the diagnosis of influenza from Nasopharyngeal swab specimens and should not be used as a sole basis for treatment. Nasal washings and aspirates are unacceptable for Xpert Xpress SARS-CoV-2/FLU/RSV testing.  Fact Sheet for Patients: BloggerCourse.com  Fact Sheet for Healthcare  Providers: SeriousBroker.it  This test is not  yet approved or cleared by the [place] and has been authorized for detection and/or diagnosis of SARS-CoV-2 by FDA under an Emergency Use Authorization (EUA). This EUA will remain in effect (meaning this test can be used) for the duration of the COVID-19 declaration under Section 564(b)(1) of the Act, 21 U.S.C. section 360bbb-3(b)(1), unless the authorization is terminated or revoked.  Performed at [facility], [address] [place], [place] [address]   Blood culture (routine single)     Status: None (Preliminary result)   Collection Time: 07/13/20  5:23 PM   Specimen: BLOOD  Result Value Ref Range Status   Specimen Description BLOOD RIGHT ANTECUBITAL  Final   Special Requests   Final    BOTTLES DRAWN AEROBIC AND ANAEROBIC Blood Culture adequate volume   Culture   Final    NO GROWTH 3 DAYS Performed at [facility], [address] [place], [place] [address]    Report Status PENDING  Incomplete    Procedures and diagnostic studies:  No results found.             LOS: 3 days   Xiadani Damman  [facility]   Pager on [email or web address]. If 7PM-7AM, please contact night-coverage at [email or web address]     07/16/2020, 12:27 PM

## 2020-07-16 NOTE — TOC Initial Note (Signed)
Transition of Care [facility]) - Initial/Assessment Note    Patient Details  Name: Jose [name] MRN: [id number] Date of Birth: 05/24/1955  Transition of Care [facility]) CM/SW Contact:    Hetty Ely, RN Phone Number: 07/16/2020, 12:53 PM  Clinical Narrative: Spoke with patient who was alert and oriented x4 in NAD. Patient lives at home with wife in [place]. Still drives, however not much, able to cook however use [employer] delivery service for shopping. [facility] [place], no problems with getting refills. PCP in [place]. Patient states he use oxygen at nigh only, also has C-Pap and Bi-Pap being services by [facility], agency and not satisfied with the service, was informed by [facility] that he will be switched to [place]. I did call [facility] Allegra [phone] to see if they have started the process. TOC to continue to track for discharge needs.              Expected Discharge Plan: Home w Home Health Services Barriers to Discharge: Continued Medical Work up   Patient Goals and CMS Choice Patient states their goals for this hospitalization and ongoing recovery are:: To return home with wife.   Choice offered to / list presented to : Patient  Expected Discharge Plan and Services Expected Discharge Plan: Home w Home Health Services     Post Acute Care Choice: Home Health Living arrangements for the past 2 months: Single Family Home                 DME Arranged: N/A DME Agency: NA       HH Arranged: PT,OT,RN HH Agency: [facility] Date HH Agency Contacted: 07/16/20 Time HH Agency Contacted: 1130 Representative spoke with at [facility] Agency: Feliberto Gottron  Prior Living Arrangements/Services Living arrangements for the past 2 months: Single Family Home Lives with:: Spouse Patient language and need for interpreter reviewed:: Yes Do you feel safe going back to the place where you live?: Yes      Need for Family Participation in Patient Care: No (Comment) Care giver  support system in place?: Yes (comment) Current home services: DME (Oxygen, C-Pap, Bi-Pap with [facility].) Criminal Activity/Legal Involvement Pertinent to Current Situation/Hospitalization: No - Comment as needed  Activities of Daily Living Home Assistive Devices/Equipment: None ADL Screening (condition at time of admission) Patient's cognitive ability adequate to safely complete daily activities?: Yes Is the patient deaf or have difficulty hearing?: No Does the patient have difficulty seeing, even when wearing glasses/contacts?: Yes Does the patient have difficulty concentrating, remembering, or making decisions?: No Patient able to express need for assistance with ADLs?: No Does the patient have difficulty dressing or bathing?: No Independently performs ADLs?: Yes (appropriate for developmental age) Does the patient have difficulty walking or climbing stairs?: Yes (d/t SOB) Weakness of Legs: None Weakness of Arms/Hands: None  Permission Sought/Granted Permission sought to share information with : Case Manager Permission granted to share information with : No              Emotional Assessment Appearance:: Appears stated age Attitude/Demeanor/Rapport: Engaged Affect (typically observed): Accepting Orientation: : Oriented to Self,Oriented to Place,Oriented to  Time,Oriented to Situation Alcohol / Substance Use: Not Applicable Psych Involvement: No (comment)  Admission diagnosis:  SOB (shortness of breath) [R06.02] COPD exacerbation (HCC) [J44.1] Patient Active Problem List   Diagnosis Date Noted  . COPD exacerbation (HCC) 07/13/2020  . Prolonged QT interval 07/13/2020  . Acute on chronic respiratory failure with  hypoxia (HCC) 07/13/2020  . Chronic diastolic CHF (congestive heart failure) (HCC) 07/13/2020  . Community acquired pneumonia 01/18/2020  . Coronary atherosclerosis 10/04/2017  . GERD (gastroesophageal reflux disease) 10/04/2017  . Hypertension 10/04/2017  .  Transient visual loss 10/04/2017  . 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 Nonrheumatic aortic valve stenosis 03/31/2017  . Pericarditis, Acute 02/12/2017  . Acute on chronic right heart failure (HCC) 02/11/2017  . Angina at rest [facility]) 08/17/2016  . Unstable angina (HCC) 08/16/2016  . Tobacco abuse 08/16/2016  . Anti-phospholipid syndrome (HCC) 08/16/2016  . Hyperlipidemia 08/16/2016  . Essential hypertension, benign 08/16/2016  . COPD (chronic obstructive pulmonary disease) (HCC) 08/16/2016  . Sleep apnea 08/16/2016  . CAD S/P multiple PCIs 08/16/2016  . History of pulmonary embolus (PE)-2011 08/16/2016  . Chronic anticoagulation-Couamdin 08/16/2016  . Non-insulin treated type 2 diabetes mellitus (HCC) 08/16/2016  . Carotid artery disease (HCC) 08/16/2016  . RBBB (right bundle branch block) 08/16/2016  . Dyspnea on exertion 10/17/2015  . ED (erectile dysfunction) 08/09/2012   PCP:  [name], MD Pharmacy[phone] [address], [place] - [address] [place] [address] Phone: [phone] Fax: [phone]     Social Determinants of Health (SDOH) Interventions    Readmission Risk Interventions Readmission Risk Prevention Plan 07/16/2020  Transportation Screening Complete  PCP or Specialist Appt within 3-5 Days Complete  HRI or Home Care Consult Complete  Social Work Consult for Recovery Care Planning/Counseling Complete  Palliative Care Screening Not Applicable  Medication Review Oceanographer) Complete  Some recent data might be hidden

## 2020-07-16 NOTE — Progress Notes (Signed)
Occupational Therapy Treatment Patient Details Name: Jose [name] MRN: [id number] DOB: [birth year] Today's Date: 07/16/2020    History of present illness Pt is a 65 yo male admitted to [facility] on 07/13/20 for c/o worsening SOB over the past 10 days. Pt hypoxic with SpO2 in 70's upon arrival; pt states he does not currently use O2 at home during the day. PMH significant for: T2DM, COPD, hx PE, CAD, CHF, RBBB and HTN.  MD assessment includes: COPD exacerbation, acute on chronic hypoxic respiratory failure, and prolonged QT interval likely due to right bundle branch block.   OT comments  Pt seen for OT treatment on this date. Upon arrival to room, pt awake and seated upright in chair on 2L of O2. Pt agreeable to OT tx. Pt performed a total of 5 UB/LB therapy exercises while seated in chair, with SpO2 90-94% throughout. Pt educated on the importance of independently performing seated UB/LB to prevent deconditioning while admitted in hospital, with pt verbalizing understanding. Following therex, pt performed standing grooming tasks (~5 mins), sit>stand UB bathing/dressing, and sit>stand LB bathing with SUPERVISION/SET-UP only for O2 monitoring.   Of note, following walk to sink and standing at sink for 1 min, SpO2 desat to 86%, however with O2 increased to 2.5L/min, SpO2 88-92% during remaining standing grooming tasks and seated UB/LB bathing. At end of session, pt left in recliner on 2L of O2, with SpO2 >92% and pt in no acute distress. RN informed. Pt re-educated on energy conversation strategies to implement during ADLs/functional mobility (i.e., positioning and seated rest breaks), with pt verbalizing and demonstrating understanding. Following today's session, all OT goals met and education completed. Patient discharged from OT services at this time. Please re-consult if additional OT needs arise. No OT follow-up recommended. Recommend medical care team continue to address O2 needs.   Follow Up  Recommendations  No OT follow up;Supervision - Intermittent    Equipment Recommendations  None recommended by OT       Precautions / Restrictions Precautions Precautions: Fall Restrictions Weight Bearing Restrictions: No Other Position/Activity Restrictions: Keep SpO2 > 92%       Mobility Bed Mobility               General bed mobility comments: not assessed, pt in recliner at beginning/end of session    Transfers Overall transfer level: Modified independent   Transfers: Sit to/from Stand Sit to Stand: Modified independent (Device/Increase time)         General transfer comment: Good eccentric and concentric control and stability    Balance Overall balance assessment: Needs assistance Sitting-balance support: No upper extremity supported;Feet supported Sitting balance-[name] Scale: Normal     Standing balance support: No upper extremity supported;During functional activity Standing balance-[name] Scale: Good                             ADL either performed or assessed with clinical judgement   ADL Overall ADL's : Needs assistance/impaired     Grooming: Oral care;Supervision/safety;Set up;Standing   Upper Body Bathing: Supervision/ safety;Set up;Sitting   Lower Body Bathing: Supervison/ safety;Set up;Sit to/from stand   Upper Body Dressing : Supervision/safety;Set up;Sitting                   Functional mobility during ADLs: Supervision/safety                 Cognition Arousal/Alertness: Awake/alert Behavior During Therapy: [facility] for tasks assessed/performed Overall Cognitive  Status: Within Functional Limits for tasks assessed                                          Exercises Total Joint Exercises Ankle Circles/Pumps: AROM;Strengthening;Both;10 reps Quad Sets: Strengthening;Both;10 reps Straight Leg Raises: AROM;Strengthening;Both;10 reps General Exercises - Upper Extremity Shoulder Flexion:  AROM;Strengthening;Both;10 reps Shoulder Horizontal ABduction: AROM;Strengthening;Both;10 reps Chair Push Up: AROM;Both;10 reps Other Exercises Other Exercises: Review of energy conservation strategies (i.e., positioning and seated rest breaks), with pt verbalizing understanding      General Comments Upon arrival to room, pt sitting upright in recliner on 2L/min of O2 (SpO2 >92% during seated conversation). Following walk to sink and standing at sink for 1 min, SpO2 desat to 86%. With O2 increased to 2.5L/min, SpO2 88-92% throughout standing grooming tasks (~3 mins) and sit>stand bathing. Pt left in recliner on 2L of O2, with SpO2 92% and pt in no acute distress. RN informed    Pertinent Vitals/ Pain       Pain Assessment: No/denies pain      Progress Toward Goals  OT Goals(current goals can now be found in the care plan section)  Progress towards OT goals: Goals met/education completed, patient discharged from OT  Acute Rehab OT Goals Patient Stated Goal: Improved strength and endurance OT Goal Formulation: With patient Time For Goal Achievement: 07/28/20 Potential to Achieve Goals: Good  Plan Discharge plan needs to be updated;All goals met and education completed, patient discharged from [place] OT "6 Clicks" Daily Activity     Outcome Measure   Help from another person eating meals?: None Help from another person taking care of personal grooming?: None Help from another person toileting, which includes using toliet, bedpan, or urinal?: A Little Help from another person bathing (including washing, rinsing, drying)?: A Little Help from another person to put on and taking off regular upper body clothing?: None Help from another person to put on and taking off regular lower body clothing?: A Little 6 Click Score: 21    End of Session Equipment Utilized During Treatment: Oxygen  OT Visit Diagnosis: Muscle weakness (generalized) (M62.81)   Activity Tolerance  Patient tolerated treatment well   Patient Left in chair;with call bell/phone within reach   Nurse Communication Mobility status        Time: 7948-0165 OT Time Calculation (min): 45 min  Charges: OT General Charges $OT Visit: 1 Visit OT Treatments $Self Care/Home Management : 23-37 mins $Therapeutic Activity: 8-22 mins  Fredirick Maudlin, OTR/L [place]

## 2020-07-16 NOTE — Care Management Important Message (Signed)
Important Message  Patient Details  Name: Jose [name] MRN: [id number] Date of Birth: Jul 29, 1955   Medicare Important Message Given:  N/A - LOS <3 / Initial given by admissions  Initial Medicare IM reviewed with patient via room phone by S. Conard Novak, Patient Access Associate on 07/15/2020 at 8:48am.   Johnell Comings 07/16/2020, 8:29 AM

## 2020-07-16 NOTE — Consult Note (Signed)
Pulmonary Medicine     cc    Socb, `wcccccc Cc cheezing and hypoxia -  c HISTORY OF PRESENT ILLNESS   This is a 65 yr old male with multiple medical problems: for recent hospitalization for pneumonia in November 2021, COPD, chronic diastolic CHF, CAD s/p PCI with stent, antiphospholipid syndrome, history of pulmonary embolism on Coumadin, type 2 diabetes mellitus, obstructive sleep apnea on CPAP at night, chronic hypoxic and hypercapnic respiratory failure on 2 L/min oxygen at night and BiPAP at night (recently converted to CPAP), carotid artery disease, right bundle branch block, morbid obesity, hyperlipidemia.  In chair at bedside, eating, speaking in full sentences. He states he developed sob wheezing, noted his sats were in the 70/5. Hence came to the ER and was admitted. On iv antibiotics, nebs, steroids. meds reviewed. He dsats easily with minimum activity. He states he is some better, but having sob with min. Activity, mobilizing sputum and wheezing.    PAST MEDICAL HISTORY   Past Medical History:  Diagnosis Date  . Antiphospholipid antibody syndrome (HCC)   . CAD S/P percutaneous coronary angioplasty '98, '04, Feb 2017   [facility]  . Carotid arterial disease (HCC) 10/2015   moderate bilateral  . COPD (chronic obstructive pulmonary disease) (HCC)   . Coronary artery disease   . Hypertension   . Non-insulin treated type 2 diabetes mellitus (HCC)   . Pulmonary emboli (HCC) 2011  . RBBB   . Sleep apnea      SURGICAL HISTORY   Past Surgical History:  Procedure Laterality Date  . APPENDECTOMY    . APPLICATION OF WOUND VAC Left 03/18/2019   Procedure: Application Of Wound Vac;  Surgeon: Nadara Mustarduda, Marcus V, MD;  [facility];  Service: Orthopedics;  Laterality: Left;  . BACK SURGERY  '89, '06  . COLONOSCOPY    . CORONARY ANGIOPLASTY WITH STENT PLACEMENT  '98, '04, Feb 2017  . I & D EXTREMITY Left 03/15/2019   Procedure: DEBRIDEMENT LEFT THIGH;  Surgeon: Nadara Mustarduda, Marcus V,  MD;  [facility];  Service: Orthopedics;  Laterality: Left;  . INCISION AND DRAINAGE OF WOUND Left 03/18/2019   Procedure: LEFT THIGH DEBRIDEMENT;  Surgeon: Nadara Mustarduda, Marcus V, MD;  [facility];  Service: Orthopedics;  Laterality: Left;     FAMILY HISTORY   Family History  Problem Relation Age of Onset  . CAD Father 7072     SOCIAL HISTORY   Social History   Tobacco Use  . Smoking status: Current Every Day Smoker    Packs/day: 0.50    Years: 40.00    Pack years: 20.00    Types: Cigarettes    Last attempt to quit: 01/15/2019    Years since quitting: 1.5  . Smokeless tobacco: Never Used  . Tobacco comment: has started smoking again, willing to quit.  Vaping Use  . Vaping Use: Never used  Substance Use Topics  . Alcohol use: No  . Drug use: No     MEDICATIONS    Home Medication:    Current Medication:  Current Facility-Administered Medications:  .  0.9 %  sodium chloride infusion, 250 mL, Intravenous, PRN, Doutova, Anastassia, MD, Last Rate: 10 mL/hr at 07/16/20 0455, 250 mL at 07/16/20 0455 .  acetaminophen (TYLENOL) tablet 650 mg, 650 mg, Oral, Q6H PRN **OR** acetaminophen (TYLENOL) suppository 650 mg, 650 mg, Rectal, Q6H PRN, Doutova, Anastassia, MD .  albuterol (PROVENTIL) (2.5 MG/3ML) 0.083% nebulizer solution 2.5 mg, 2.5 mg, Nebulization, Q2H PRN,  Therisa Doyne, MD .  albuterol (VENTOLIN HFA) 108 (90 Base) MCG/ACT inhaler 2 puff, 2 puff, Inhalation, Q6H PRN, Lurene Shadow, MD .  aspirin EC tablet 81 mg, 81 mg, Oral, Daily, Doutova, Anastassia, MD, 81 mg at 07/16/20 0848 .  cefTRIAXone (ROCEPHIN) 1 g in sodium chloride 0.9 % 100 mL IVPB, 1 g, Intravenous, Q24H, Doutova, Anastassia, MD, Last Rate: 200 mL/hr at 07/16/20 0457, 1 g at 07/16/20 0457 .  DULoxetine (CYMBALTA) DR capsule 60 mg, 60 mg, Oral, Daily, Lurene Shadow, MD, 60 mg at 07/16/20 0848 .  fluticasone furoate-vilanterol (BREO ELLIPTA) 200-25 MCG/INH 1 puff, 1 puff, Inhalation, Daily, 1 puff at  07/16/20 0850 **AND** umeclidinium bromide (INCRUSE ELLIPTA) 62.5 MCG/INH 1 puff, 1 puff, Inhalation, Daily, Lurene Shadow, MD, 1 puff at 07/16/20 0849 .  gabapentin (NEURONTIN) tablet 600 mg, 600 mg, Oral, TID, Lurene Shadow, MD, 600 mg at 07/16/20 0848 .  HYDROcodone-acetaminophen (NORCO/VICODIN) 5-325 MG per tablet 1-2 tablet, 1-2 tablet, Oral, Q4H PRN, Doutova, Anastassia, MD .  insulin aspart (novoLOG) injection 0-9 Units, 0-9 Units, Subcutaneous, Q4H, Doutova, Anastassia, MD, 5 Units at 07/16/20 1300 .  ipratropium-albuterol (DUONEB) 0.5-2.5 (3) MG/3ML nebulizer solution 3 mL, 3 mL, Nebulization, BID, Lurene Shadow, MD, 3 mL at 07/16/20 0820 .  metoprolol tartrate (LOPRESSOR) tablet 12.5 mg, 12.5 mg, Oral, BID, Lurene Shadow, MD, 12.5 mg at 07/16/20 0848 .  nicotine (NICODERM CQ - dosed in mg/24 hours) patch 21 mg, 21 mg, Transdermal, Daily, Doutova, Anastassia, MD, 21 mg at 07/16/20 0858 .  pantoprazole (PROTONIX) EC tablet 40 mg, 40 mg, Oral, BID, Lurene Shadow, MD, 40 mg at 07/16/20 0848 .  [EXPIRED] methylPREDNISolone sodium succinate (SOLU-MEDROL) 125 mg/2 mL injection 60 mg, 60 mg, Intravenous, Q6H, 60 mg at 07/15/20 0011 **FOLLOWED BY** predniSONE (DELTASONE) tablet 40 mg, 40 mg, Oral, Q breakfast, Doutova, Anastassia, MD, 40 mg at 07/16/20 0850 .  rosuvastatin (CRESTOR) tablet 10 mg, 10 mg, Oral, QPM, Doutova, Anastassia, MD, 10 mg at 07/15/20 1721 .  sodium chloride flush (NS) 0.9 % injection 3 mL, 3 mL, Intravenous, Q12H, Doutova, Anastassia, MD, 3 mL at 07/16/20 1000 .  sodium chloride flush (NS) 0.9 % injection 3 mL, 3 mL, Intravenous, PRN, Doutova, Anastassia, MD .  tamsulosin (FLOMAX) capsule 0.8 mg, 0.8 mg, Oral, Daily, Lurene Shadow, MD, 0.8 mg at 07/16/20 0900 .  tiZANidine (ZANAFLEX) tablet 4 mg, 4 mg, Oral, BID PRN, Lurene Shadow, MD, 4 mg at 07/15/20 2120 .  topiramate (TOPAMAX) tablet 50 mg, 50 mg, Oral, BID, Doutova, Anastassia, MD, 50 mg at 07/16/20 0855 .   torsemide (DEMADEX) tablet 20 mg, 20 mg, Oral, Daily, Doutova, Anastassia, MD, 20 mg at 07/16/20 0848 .  warfarin (COUMADIN) tablet 4 mg, 4 mg, Oral, ONCE-1600, Ronnald Ramp, RPH .  Warfarin - Pharmacist Dosing Inpatient, , Does not apply, q1600, Foye Deer, RPH    ALLERGIES   Melatonin and Ramipril     REVIEW OF SYSTEMS    Review of Systems:  Gen:  Denies  fever, sweats, chills weigh loss  HEENT: Denies blurred vision, double vision, ear pain, eye pain, hearing loss, nose bleeds, sore throat Cardiac:  No dizziness, chest pain or heaviness, chest tightness,edema Resp:  ++ or sputum porduction, ++shortness of breath,+ wheezing, hemoptysis,  Gi: Denies swallowing difficulty, stomach pain, nausea or vomiting, diarrhea, constipation, bowel incontinence Gu:  Denies bladder incontinence, burning urine Ext:   Denies Joint pain, stiffness or swelling Skin: Denies  skin rash, easy bruising or  bleeding or hives Endoc:  Denies polyuria, polydipsia , polyphagia or weight change Psych:   Denies depression, insomnia or hallucinations   Other:  All other systems negative   VS: BP 130/77 (BP Location: Left Arm)   Pulse 64   Temp 98.2 F (36.8 C) (Oral)   Resp 19   Ht 5\' 7"  (1.702 m)   Wt 110.5 kg   SpO2 98%   BMI 38.15 kg/m      PHYSICAL EXAM    GENERAL:NAD, no fevers, chills, over weight, [place] 02, on HEAD: Normocephalic, atraumatic.  EYES: Pupils equal, round, reactive to light. Extraocular muscles intact. No scleral icterus.  MOUTH: Moist mucosal membrane. Dentition intact. No abscess noted.  EAR, NOSE, THROAT: Clear without exudates. No external lesions.  NECK: Supple. No thyromegaly. No nodules. No JVD.  PULMONARY: Diffuse coarse rhonchi, distant wheezing CARDIOVASCULAR: S1 and S2. Regular rate and rhythm. No murmurs, rubs, or gallops. No edema. Pedal pulses 2+ bilaterally.  GASTROINTESTINAL: Soft, nontender, nondistended. No masses. Positive bowel sounds. No  hepatosplenomegaly.  MUSCULOSKELETAL: No swelling, clubbing, + trace edema. Range of motion full in all extremities.  NEUROLOGIC: Cranial nerves II through XII are intact. No gross focal neurological deficits. Sensation intact. Reflexes intact.  SKIN: No ulceration, lesions, rashes, or cyanosis. Skin warm and dry. Turgor intact.  PSYCHIATRIC: Mood, affect within normal limits. The patient is awake, alert and oriented x 3. Insight, judgment intact.       IMAGING    CT Angio Chest PE W and/or Wo Contrast  Result Date: 07/13/2020 CLINICAL DATA:  Increasing shortness of breath for 10 days, COPD EXAM: CT ANGIOGRAPHY CHEST WITH CONTRAST TECHNIQUE: Multidetector CT imaging of the chest was performed using the standard protocol during bolus administration of intravenous contrast. Multiplanar CT image reconstructions and MIPs were obtained to evaluate the vascular anatomy. CONTRAST:  27mL OMNIPAQUE IOHEXOL 350 MG/ML SOLN COMPARISON:  07/13/2020, 12/27/2018 FINDINGS: Cardiovascular: This is a technically adequate evaluation of the pulmonary vasculature. There are no filling defects or pulmonary emboli. The heart is unremarkable without pericardial effusion. No evidence of thoracic aortic aneurysm or dissection. Postsurgical changes are seen from median sternotomy and coronary artery bypass. Mild atherosclerosis of the aortic arch. Extensive atherosclerosis of the native coronary vessels. Moderate calcification of the aortic valve. Mediastinum/Nodes: No pathologically enlarged mediastinal, hilar, or axillary adenopathy. Thyroid gland, trachea, and esophagus demonstrate no significant findings. Lungs/Pleura: No acute airspace disease, effusion, or pneumothorax. Minimal scarring within the right lower lobe. Central airways are patent. Upper Abdomen: No acute abnormality. Musculoskeletal: No acute or destructive bony lesions. Reconstructed images demonstrate no additional findings. Review of the MIP images confirms  the above findings. IMPRESSION: 1. No evidence of pulmonary embolus. 2. No acute intrathoracic process. 3.  Aortic Atherosclerosis (ICD10-I70.0). Electronically Signed   By: 12/29/2018 M.D.   On: 07/13/2020 19:25   DG Chest Portable 1 View  Result Date: 07/13/2020 CLINICAL DATA:  Shortness of breath and cough. EXAM: PORTABLE CHEST 1 VIEW COMPARISON:  01/18/2020 FINDINGS: Cardiomegaly and CABG changes again noted. Mild pulmonary vascular congestion is present. There is no evidence of focal airspace disease, pulmonary edema, suspicious pulmonary nodule/mass, pleural effusion, or pneumothorax. No acute bony abnormalities are identified. IMPRESSION: Cardiomegaly with mild pulmonary vascular congestion. Electronically Signed   By: 13/06/2019 M.D.   On: 07/13/2020 17:08      ASSESSMENT/PLAN  Copd exacerbation, mild pulm edema Plan Continue as is Add flutter valve Add mucomyst if unable to fully mobilize sputum  physical therapy cautious diuresis as you doing  OSA CPAP on same settings     Thank you for allowing me to participate in the care of this patient.   Patient/Family are satisfied with care plan and all questions have been answered.  This document was prepared using Dragon voice recognition software and may include unintentional dictation errors.     Ned Clines, M.D.  Division of Pulmonary & Critical Care Medicine  [facility]

## 2020-07-16 NOTE — Progress Notes (Signed)
ANTICOAGULATION CONSULT NOTE  Pharmacy Consult for Warfarin  Indication: continuation of therapy for VTE  Patient Measurements: Height: 5\' 7"  (170.2 cm) Weight: 110.5 kg (243 lb 9.7 oz) IBW/kg (Calculated) : 66.1  Labs: Recent Labs    07/13/20 1632 07/14/20 0500 07/14/20 0600 07/15/20 0539 07/16/20 0424  HGB 13.3  --  13.1  --   --   HCT 42.3  --  42.0  --   --   PLT 230  --  212  --   --   APTT 70*  --   --   --   --   LABPROT 24.8* 25.4*  --  30.0* 29.4*  INR 2.2* 2.3*  --  2.9* 2.8*  CREATININE 0.73  --  0.63  --   --   TROPONINIHS 7  --   --   --   --     Estimated Creatinine Clearance: 109.2 mL/min (by C-G formula based on SCr of 0.63 mg/dL).   Medical History: Past Medical History:  Diagnosis Date  . Antiphospholipid antibody syndrome (HCC)   . CAD S/P percutaneous coronary angioplasty '98, '04, Feb 2017   [facility]  . Carotid arterial disease (HCC) 10/2015   moderate bilateral  . COPD (chronic obstructive pulmonary disease) (HCC)   . Coronary artery disease   . Hypertension   . Non-insulin treated type 2 diabetes mellitus (HCC)   . Pulmonary emboli (HCC) 2011  . RBBB   . Sleep apnea    Assessment: Patient is a 65yo male admitted with shortness of breath. 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   Home Dose: 7mg  on Monday and Friday                       6mg  all other days  INR on Admission: 2.2   DDI: Ceftriaxone (increase INR), duloxetine (increase risk of bleeding), asa (increase risk of bleeding), torsemide (increase warfarin concentration)  Date INR Warfarin Dose  5/1 2.3 6 mg  5/2 2.9 4 mg   5/3 2.8 4 mg      Goal of Therapy:  INR 2-3   Plan:  Patient is therapeutic on home regimen but on the upper limit of goal. Will order Warfarin 4mg  dose for this evening (~40% decrease from home dose). Daily INR ordered. CBC at least every 3 days.   Sunday, PharmD, BCPS 07/16/2020 8:49 AM

## 2020-07-17 LAB — GLUCOSE, CAPILLARY
Glucose-Capillary: 133 mg/dL — ABNORMAL HIGH (ref 70–99)
Glucose-Capillary: 140 mg/dL — ABNORMAL HIGH (ref 70–99)
Glucose-Capillary: 142 mg/dL — ABNORMAL HIGH (ref 70–99)
Glucose-Capillary: 150 mg/dL — ABNORMAL HIGH (ref 70–99)
Glucose-Capillary: 247 mg/dL — ABNORMAL HIGH (ref 70–99)
Glucose-Capillary: 325 mg/dL — ABNORMAL HIGH (ref 70–99)

## 2020-07-17 LAB — BASIC METABOLIC PANEL
Anion gap: 12 (ref 5–15)
BUN: 33 mg/dL — ABNORMAL HIGH (ref 8–23)
CO2: 33 mmol/L — ABNORMAL HIGH (ref 22–32)
Calcium: 9.2 mg/dL (ref 8.9–10.3)
Chloride: 94 mmol/L — ABNORMAL LOW (ref 98–111)
Creatinine, Ser: 0.87 mg/dL (ref 0.61–1.24)
GFR, Estimated: >60 mL/min (ref >60)
Glucose, Bld: 130 mg/dL — ABNORMAL HIGH (ref 70–99)
Potassium: 3.8 mmol/L (ref 3.5–5.1)
Sodium: 139 mmol/L (ref 135–145)

## 2020-07-17 LAB — CBC
HCT: 44 % (ref 39.0–52.0)
Hemoglobin: 13.6 g/dL (ref 13.0–17.0)
MCH: 28.8 pg (ref 26.0–34.0)
MCHC: 30.9 g/dL (ref 30.0–36.0)
MCV: 93 fL (ref 80.0–100.0)
Platelets: 278 10*3/uL (ref 150–400)
RBC: 4.73 MIL/uL (ref 4.22–5.81)
RDW: 14.9 % (ref 11.5–15.5)
WBC: 13.8 10*3/uL — ABNORMAL HIGH (ref 4.0–10.5)
nRBC: 0 % (ref 0.0–0.2)

## 2020-07-17 LAB — HEMOGLOBIN A1C
Hgb A1c MFr Bld: 8 % — ABNORMAL HIGH (ref 4.8–5.6)
Mean Plasma Glucose: 182.9 mg/dL

## 2020-07-17 LAB — PROTIME-INR
INR: 2.4 — ABNORMAL HIGH (ref 0.8–1.2)
Prothrombin Time: 26.5 seconds — ABNORMAL HIGH (ref 11.4–15.2)

## 2020-07-17 MED ORDER — INSULIN ASPART 100 UNIT/ML IJ SOLN
0.0000 [IU] | Freq: Every day | INTRAMUSCULAR | Status: DC
  Administered 2020-07-17: 2 [IU] via SUBCUTANEOUS
  Administered 2020-07-19: 3 [IU] via SUBCUTANEOUS
  Filled 2020-07-17 (×2): qty 1

## 2020-07-17 MED ORDER — INSULIN ASPART 100 UNIT/ML IJ SOLN
4.0000 [IU] | Freq: Three times a day (TID) | INTRAMUSCULAR | Status: DC
  Administered 2020-07-17 – 2020-07-19 (×8): 4 [IU] via SUBCUTANEOUS
  Filled 2020-07-17 (×8): qty 1

## 2020-07-17 MED ORDER — WARFARIN SODIUM 6 MG PO TABS
6.0000 mg | ORAL_TABLET | Freq: Once | ORAL | Status: AC
  Administered 2020-07-17: 6 mg via ORAL
  Filled 2020-07-17: qty 1

## 2020-07-17 MED ORDER — INSULIN ASPART 100 UNIT/ML IJ SOLN
0.0000 [IU] | Freq: Three times a day (TID) | INTRAMUSCULAR | Status: DC
  Administered 2020-07-17: 2 [IU] via SUBCUTANEOUS
  Administered 2020-07-17: 11 [IU] via SUBCUTANEOUS
  Administered 2020-07-18: 09:00:00 2 [IU] via SUBCUTANEOUS
  Administered 2020-07-18: 12:00:00 3 [IU] via SUBCUTANEOUS
  Administered 2020-07-18: 8 [IU] via SUBCUTANEOUS
  Administered 2020-07-19: 3 [IU] via SUBCUTANEOUS
  Administered 2020-07-19: 8 [IU] via SUBCUTANEOUS
  Filled 2020-07-17 (×7): qty 1

## 2020-07-17 NOTE — Progress Notes (Signed)
Mobility Specialist - Progress Note   07/17/20 1543  Mobility  Activity Refused mobility  Mobility performed by Mobility specialist    Pt sleeping in recliner upon arrival, easily awakened by voice. Pt politely declined mobility this date d/t wanting to rest. Declined transfer recliner-bed. Will attempt session another date/time.    Filiberto Pinks Mobility Specialist 07/17/20, 3:44 PM

## 2020-07-17 NOTE — Progress Notes (Signed)
ANTICOAGULATION CONSULT NOTE  Pharmacy Consult for Warfarin  Indication: continuation of therapy for VTE  Patient Measurements: Height: 5\' 7"  (170.2 cm) Weight: 108.8 kg (239 lb 13.8 oz) IBW/kg (Calculated) : 66.1  Labs: Recent Labs    07/15/20 0539 07/16/20 0424 07/17/20 0418  HGB  --   --  13.6  HCT  --   --  44.0  PLT  --   --  278  LABPROT 30.0* 29.4* 26.5*  INR 2.9* 2.8* 2.4*  CREATININE  --   --  0.87    Estimated Creatinine Clearance: 99.6 mL/min (by C-G formula based on SCr of 0.87 mg/dL).   Medical History: Past Medical History:  Diagnosis Date  . Antiphospholipid antibody syndrome (HCC)   . CAD S/P percutaneous coronary angioplasty '98, '04, Feb 2017   [facility]  . Carotid arterial disease (HCC) 10/2015   moderate bilateral  . COPD (chronic obstructive pulmonary disease) (HCC)   . Coronary artery disease   . Hypertension   . Non-insulin treated type 2 diabetes mellitus (HCC)   . Pulmonary emboli (HCC) 2011  . RBBB   . Sleep apnea    Assessment: Patient is a 65yo male admitted with shortness of breath. 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   Home Dose: 7mg  on Monday and Friday                       6mg  all other days  INR on Admission: 2.2   DDI: Ceftriaxone (increase INR), duloxetine (increase risk of bleeding), asa (increase risk of bleeding), torsemide (increase warfarin concentration)  Date INR Warfarin Dose  5/1 2.3 6 mg  5/2 2.9 4 mg   5/3 2.8 4 mg   5/4 2.4 6 mg     Goal of Therapy:  INR 2-3   Plan:  Patient is therapeutic. Will order Warfarin 6mg  dose for this evening (home dose). Daily INR ordered. CBC at least every 3 days.   Thursday, PharmD, BCPS 07/17/2020 8:42 AM

## 2020-07-17 NOTE — Progress Notes (Signed)
Pulmonary Medicine     HISTORY OF PRESENT ILLNESS   Still dyspnea, wheezing, not back to baseline, coughing up more suff, no blood.desat easily with activity, occurring for months he said). Feel better on the three litters.     PAST MEDICAL HISTORY   Past Medical History:  Diagnosis Date  . Antiphospholipid antibody syndrome (HCC)   . CAD S/P percutaneous coronary angioplasty '98, '04, Feb 2017   [facility]  . Carotid arterial disease (HCC) 10/2015   moderate bilateral  . COPD (chronic obstructive pulmonary disease) (HCC)   . Coronary artery disease   . Hypertension   . Non-insulin treated type 2 diabetes mellitus (HCC)   . Pulmonary emboli (HCC) 2011  . RBBB   . Sleep apnea      SURGICAL HISTORY   Past Surgical History:  Procedure Laterality Date  . APPENDECTOMY    . APPLICATION OF WOUND VAC Left 03/18/2019   Procedure: Application Of Wound Vac;  Surgeon: Nadara Mustard, MD;  Location: [facility] OR;  Service: Orthopedics;  Laterality: Left;  . BACK SURGERY  '89, '06  . COLONOSCOPY    . CORONARY ANGIOPLASTY WITH STENT PLACEMENT  '98, '04, Feb 2017  . I & D EXTREMITY Left 03/15/2019   Procedure: DEBRIDEMENT LEFT THIGH;  Surgeon: Nadara Mustard, MD;  Location: [facility] OR;  Service: Orthopedics;  Laterality: Left;  . INCISION AND DRAINAGE OF WOUND Left 03/18/2019   Procedure: LEFT THIGH DEBRIDEMENT;  Surgeon: Nadara Mustard, MD;  Location: [facility] OR;  Service: Orthopedics;  Laterality: Left;     FAMILY HISTORY   Family History  Problem Relation Age of Onset  . CAD Father 61     SOCIAL HISTORY   Social History   Tobacco Use  . Smoking status: Current Every Day Smoker    Packs/day: 0.50    Years: 40.00    Pack years: 20.00    Types: Cigarettes    Last attempt to quit: 01/15/2019    Years since quitting: 1.5  . Smokeless tobacco: Never Used  . Tobacco comment: has started smoking again, willing to quit.  Vaping Use  . Vaping Use: Never used  Substance Use Topics  .  Alcohol use: No  . Drug use: No     MEDICATIONS    Home Medication:    Current Medication:  Current Facility-Administered Medications:  .  0.9 %  sodium chloride infusion, 250 mL, Intravenous, PRN, Doutova, Anastassia, MD, Last Rate: 10 mL/hr at 07/16/20 0455, 250 mL at 07/16/20 0455 .  acetaminophen (TYLENOL) tablet 650 mg, 650 mg, Oral, Q6H PRN **OR** acetaminophen (TYLENOL) suppository 650 mg, 650 mg, Rectal, Q6H PRN, Doutova, Anastassia, MD .  albuterol (PROVENTIL) (2.5 MG/3ML) 0.083% nebulizer solution 2.5 mg, 2.5 mg, Nebulization, Q2H PRN, Doutova, Anastassia, MD, 2.5 mg at 07/16/20 1627 .  albuterol (VENTOLIN HFA) 108 (90 Base) MCG/ACT inhaler 2 puff, 2 puff, Inhalation, Q6H PRN, Lurene Shadow, MD .  aspirin EC tablet 81 mg, 81 mg, Oral, Daily, Doutova, Anastassia, MD, 81 mg at 07/17/20 0942 .  cefTRIAXone (ROCEPHIN) 1 g in sodium chloride 0.9 % 100 mL IVPB, 1 g, Intravenous, Q24H, Doutova, Anastassia, MD, Last Rate: 200 mL/hr at 07/17/20 0538, 1 g at 07/17/20 0538 .  DULoxetine (CYMBALTA) DR capsule 60 mg, 60 mg, Oral, Daily, Lurene Shadow, MD, 60 mg at 07/17/20 0943 .  fluticasone furoate-vilanterol (BREO ELLIPTA) 200-25 MCG/INH 1 puff, 1 puff, Inhalation, Daily, 1 puff at 07/17/20 0944 **AND** umeclidinium bromide (  INCRUSE ELLIPTA) 62.5 MCG/INH 1 puff, 1 puff, Inhalation, Daily, Lurene ShadowAyiku, Bernard, MD, 1 puff at 07/17/20 0944 .  gabapentin (NEURONTIN) tablet 600 mg, 600 mg, Oral, TID, Lurene ShadowAyiku, Bernard, MD, 600 mg at 07/17/20 0943 .  HYDROcodone-acetaminophen (NORCO/VICODIN) 5-325 MG per tablet 1-2 tablet, 1-2 tablet, Oral, Q4H PRN, Doutova, Anastassia, MD .  insulin aspart (novoLOG) injection 0-15 Units, 0-15 Units, Subcutaneous, TID WC, Rai, Ripudeep K, MD .  insulin aspart (novoLOG) injection 0-5 Units, 0-5 Units, Subcutaneous, QHS, Rai, Ripudeep K, MD .  insulin aspart (novoLOG) injection 4 Units, 4 Units, Subcutaneous, TID WC, Rai, Ripudeep K, MD .  ipratropium-albuterol (DUONEB)  0.5-2.5 (3) MG/3ML nebulizer solution 3 mL, 3 mL, Nebulization, BID, Lurene ShadowAyiku, Bernard, MD, 3 mL at 07/17/20 0853 .  metoprolol tartrate (LOPRESSOR) tablet 12.5 mg, 12.5 mg, Oral, BID, Lurene ShadowAyiku, Bernard, MD, 12.5 mg at 07/17/20 0942 .  nicotine (NICODERM CQ - dosed in mg/24 hours) patch 21 mg, 21 mg, Transdermal, Daily, Doutova, Anastassia, MD, 21 mg at 07/17/20 0945 .  pantoprazole (PROTONIX) EC tablet 40 mg, 40 mg, Oral, BID, Lurene ShadowAyiku, Bernard, MD, 40 mg at 07/17/20 0943 .  [EXPIRED] methylPREDNISolone sodium succinate (SOLU-MEDROL) 125 mg/2 mL injection 60 mg, 60 mg, Intravenous, Q6H, 60 mg at 07/15/20 0011 **FOLLOWED BY** predniSONE (DELTASONE) tablet 40 mg, 40 mg, Oral, Q breakfast, Doutova, Anastassia, MD, 40 mg at 07/17/20 0943 .  rosuvastatin (CRESTOR) tablet 10 mg, 10 mg, Oral, QPM, Doutova, Anastassia, MD, 10 mg at 07/16/20 1809 .  sodium chloride flush (NS) 0.9 % injection 3 mL, 3 mL, Intravenous, Q12H, Doutova, Anastassia, MD, 3 mL at 07/17/20 0945 .  sodium chloride flush (NS) 0.9 % injection 3 mL, 3 mL, Intravenous, PRN, Doutova, Anastassia, MD .  tamsulosin (FLOMAX) capsule 0.8 mg, 0.8 mg, Oral, Daily, Lurene ShadowAyiku, Bernard, MD, 0.8 mg at 07/17/20 0943 .  tiZANidine (ZANAFLEX) tablet 4 mg, 4 mg, Oral, BID PRN, Lurene ShadowAyiku, Bernard, MD, 4 mg at 07/15/20 2120 .  topiramate (TOPAMAX) tablet 50 mg, 50 mg, Oral, BID, Doutova, Anastassia, MD, 50 mg at 07/17/20 0943 .  torsemide (DEMADEX) tablet 20 mg, 20 mg, Oral, Daily, Doutova, Anastassia, MD, 20 mg at 07/17/20 0943 .  warfarin (COUMADIN) tablet 6 mg, 6 mg, Oral, ONCE-1600, Ronnald Rampatel, Kishan S, RPH .  Warfarin - Pharmacist Dosing Inpatient, , Does not apply, q1600, Foye DeerKluttz, Lisa G, [facility], Given at 07/16/20 1632    ALLERGIES   Melatonin and Ramipril     REVIEW OF SYSTEMS    Review of Systems:  Gen:  Denies  fever, sweats, chills weigh loss  HEENT: Denies blurred vision, double vision, ear pain, eye pain, hearing loss, nose bleeds, sore throat Cardiac:   No dizziness, chest pain or heaviness, chest tightness,edema Resp:   + cough or sputum porduction, + chronic shortness of breath,+ wheezing, -hemoptysis,  Gi: Denies swallowing difficulty, stomach pain, nausea or vomiting, diarrhea, constipation, bowel incontinence Gu:  Denies bladder incontinence, burning urine Ext:   Denies Joint pain, stiffness or swelling Skin: Denies  skin rash, easy bruising or bleeding or hives Endoc:  Denies polyuria, polydipsia , polyphagia or weight change Psych:   Denies depression, insomnia or hallucinations   Other:  All other systems negative   VS: BP 116/68 (BP Location: Left Arm)   Pulse 70   Temp 97.7 F (36.5 C) (Oral)   Resp 18   Ht 5\' 7"  (1.702 m)   Wt 108.8 kg   SpO2 98%   BMI 37.57 kg/m  PHYSICAL EXAM    GENERAL:NAD, no fevers, chills, sitting in chair having breakfast, speaking in full sentences HEAD: Normocephalic, atraumatic.  EYES: Pupils equal, round, reactive to light. Extraocular muscles intact. No scleral icterus.  MOUTH: Moist mucosal membrane. Dentition intact. No abscess noted.  EAR, NOSE, THROAT: Clear without exudates. No external lesions.  NECK: Supple. No thyromegaly. No nodules. No JVD, short, thick neck.  PULMONARY: Diffuse +wheezes CARDIOVASCULAR: S1 and S2. Regular rate and rhythm. No murmurs, rubs, or gallops. No edema. Pedal pulses 2+ bilaterally.  GASTROINTESTINAL: Soft, nontender, nondistended. No masses. Positive bowel sounds. No hepatosplenomegaly.  MUSCULOSKELETAL: No swelling, clubbing,+ trace  edema. Range of motion full in all extremities.  NEUROLOGIC: Cranial nerves II through XII are intact. No gross focal neurological deficits. Sensation intact. Reflexes intact.  SKIN: No ulceration, lesions, rashes, or cyanosis. Skin warm and dry. Turgor intact.  PSYCHIATRIC: Mood, affect within normal limits. The patient is awake, alert and oriented x 3. Insight, judgment intact.       IMAGING    CT Angio  Chest PE W and/or Wo Contrast  Result Date: 07/13/2020 CLINICAL DATA:  Increasing shortness of breath for 10 days, COPD EXAM: CT ANGIOGRAPHY CHEST WITH CONTRAST TECHNIQUE: Multidetector CT imaging of the chest was performed using the standard protocol during bolus administration of intravenous contrast. Multiplanar CT image reconstructions and MIPs were obtained to evaluate the vascular anatomy. CONTRAST:  63mL OMNIPAQUE IOHEXOL 350 MG/ML SOLN COMPARISON:  07/13/2020, 12/27/2018 FINDINGS: Cardiovascular: This is a technically adequate evaluation of the pulmonary vasculature. There are no filling defects or pulmonary emboli. The heart is unremarkable without pericardial effusion. No evidence of thoracic aortic aneurysm or dissection. Postsurgical changes are seen from median sternotomy and coronary artery bypass. Mild atherosclerosis of the aortic arch. Extensive atherosclerosis of the native coronary vessels. Moderate calcification of the aortic valve. Mediastinum/Nodes: No pathologically enlarged mediastinal, hilar, or axillary adenopathy. Thyroid gland, trachea, and esophagus demonstrate no significant findings. Lungs/Pleura: No acute airspace disease, effusion, or pneumothorax. Minimal scarring within the right lower lobe. Central airways are patent. Upper Abdomen: No acute abnormality. Musculoskeletal: No acute or destructive bony lesions. Reconstructed images demonstrate no additional findings. Review of the MIP images confirms the above findings. IMPRESSION: 1. No evidence of pulmonary embolus. 2. No acute intrathoracic process. 3.  Aortic Atherosclerosis (ICD10-I70.0). Electronically Signed   By: Sharlet Salina M.D.   On: 07/13/2020 19:25   DG Chest Portable 1 View  Result Date: 07/13/2020 CLINICAL DATA:  Shortness of breath and cough. EXAM: PORTABLE CHEST 1 VIEW COMPARISON:  01/18/2020 FINDINGS: Cardiomegaly and CABG changes again noted. Mild pulmonary vascular congestion is present. There is no  evidence of focal airspace disease, pulmonary edema, suspicious pulmonary nodule/mass, pleural effusion, or pneumothorax. No acute bony abnormalities are identified. IMPRESSION: Cardiomegaly with mild pulmonary vascular congestion. Electronically Signed   By: Harmon Pier M.D.   On: 07/13/2020 17:08   FVC Pre L 1.53   FEV1 Pre L 0.99   FEV1/FVC Pre % 64.66   FEF25-75% Pre L/s 0.55   PEF Pre L/s 2.54   FVC_LLN  2.92   FVC_Z-SCORE  -4.08   FVC_%PRED % 39 %   FVC_Z-SCORE  -4.08   FEV1_LLN  2.22   FEV1_Z-SCORE  -3.92   FEV1_%PRED % 33 %   FEV1_Z-SCORE  -3.92   FEV1/FVC_LLN  65   FEF25-75%_LLN  1.14   FEF25-75%_Z-SCORE  -2.72   FEF25-75%_%PRED % 22 %   FEF25-75%_Z-SCORE  -2.72   PEF_LLN  5.99  PEF_%PRED % 32 %   Resulting Agency  [id number]  Specimen Collected: 03/04/20 9:57 AM Last Resulted: 03/05/20 9:20 AM  Received From: [facility]  Result Received: 04/      ASSESSMENT/PLAN   Copd exacerbation ( hx of severe copd), mild pulm edema, obesity, refractory bronchospasm, slowly improving. He will always be short of breath as discussed with him. Fev1 0.99 liters.  Continue as is ( steroids, antibiotics, b2 agonist and anticholinergics) Flutter valve Mucomyst if unable to fully mobilize sputum Physical therapy Cautious diuresis as you doing  OSA While here  Put back on his bipap settings ( help his osa and breathing) He goes back to [facility] on d/c for switching his bipap to auto cpap  Weight loss     Thank you for allowing me to participate in the care of this patient.   Patient/Family are satisfied with care plan and all questions have been answered.  This document was prepared using Dragon voice recognition software and may include unintentional dictation errors.     Ned Clines, M.D.  Division of Pulmonary & Critical Care Medicine  [facility]

## 2020-07-17 NOTE — Progress Notes (Signed)
Inpatient Diabetes Program Recommendations  AACE/ADA: New Consensus Statement on Inpatient Glycemic Control (2015)  Target Ranges:  Prepandial:   less than 140 mg/dL      Peak postprandial:   less than 180 mg/dL (1-2 hours)      Critically ill patients:  140 - 180 mg/dL   Lab Results  Component Value Date   GLUCAP 133 (H) 07/17/2020   HGBA1C 7.7 (H) 07/14/2020    Review of Glycemic Control  Diabetes history: DM2 Outpatient Diabetes medications: Ozempic (not taking due to cost) Current orders for Inpatient glycemic control Novolog 0-9 units q 4 hrs. correction  Inpatient Diabetes Program Recommendations:   Since patient is now eating and on steroids: Postprandial CBGs elevated -Change Novolog correction to 0-15 units tid + hs 0-5 units -Add Novolog 4 units tid meal coverage if eats 50% Secure chat sent to Dr. Isidoro Donning.  Thank you, Jose [name]. Jose Filosa, RN, MSN, CDE  Diabetes Coordinator Inpatient Glycemic Control Team Team Pager [phone] (8am-5pm) 07/17/2020 9:42 AM

## 2020-07-17 NOTE — Progress Notes (Signed)
[facility]                                                                              Patient Demographics  Jose [name], is a 65 y.o. male, DOB - Oct 23, 1955, KKX:[id number]  Admit date - 07/13/2020   Admitting Physician Therisa Doyne, MD  Outpatient Primary MD for the patient is [name], MD  Outpatient specialists:   LOS - 4  days   Medical records reviewed and are as summarized below:    Chief Complaint  Patient presents with  . Shortness of Breath       Brief summary   Patient is a 65 year old male with recent hospitalization for pneumonia in November 2021, COPD, chronic diastolic CHF, CAD s/p PCI with stent, antiphospholipid syndrome, history of pulmonary embolism on Coumadin, type 2 DM, OSA on CPAP at night, chronic hypoxic and hypercapnic respiratory failure on 2 L/min oxygen at night and BiPAP at night (recently converted to CPAP), carotid artery disease, RBBB, morbid obesity, hyperlipidemia.    Patient presented with shortness of breath, wheezing and hypoxia with O2 sats 76% on room air. He was admitted to the hospital for COPD exacerbation and acute on chronic hypoxic respiratory failure   Assessment & Plan    Principal Problem:   Acute on chronic respiratory failure with hypoxia and hypercapnia (HCC) -Continue CPAP at night, still wheezing with coarse breath sounds bilaterally.  Patient reports that he uses home O2 at night and CPAP.  Likely will need O2 continuous. -Pulmonology following -Continue prednisone, IV Rocephin, scheduled nebs -Wean O2 as tolerated.  Continue flutter.  Home O2 evaluation prior to discharge.   Active Problems: Acute on chronic diastolic CHF -Received IV Lasix on 5/3, continue torsemide -Negative balance of 7.18 L -2D echo 02/2019 had shown EF of 55 to 60% with G1 DD   OSA  -Per pulmonology, while inpatient placed back on BiPAP settings -Patient will be following [facility] for switching his BiPAP to  auto CPAP  History of pulmonary embolism in 2011, antiphospholipid syndrome -Continue Coumadin per pharmacy  History of CAD status post PCI with stent -Currently no chest pain, continue aspirin and statin  Diabetes mellitus type 2, not on long-term insulin, history of CAD -Ozempic on hold, CBGs currently uncontrolled due to steroids -Placed on sliding scale insulin sensitive, added NovoLog meal coverage 4 units 3 times daily AC -Hemoglobin A1c 7.7   Obesity Estimated body mass index is 37.57 kg/m as calculated from the following:   Height as of this encounter: 5\' 7"  (1.702 m).   Weight as of this encounter: 108.8 kg.  Code Status: DNR DVT Prophylaxis:   warfarin (COUMADIN) tablet 6 mg   Level of Care: Level of care: Med-Surg Family Communication: Discussed all imaging results, lab results, explained to the patient   Disposition Plan:     Status is: Inpatient  Remains inpatient appropriate because:Inpatient level of care appropriate due to severity of illness   Dispo: The patient is from: Home              Anticipated d/c is to: Home  Patient currently is not medically stable to d/c.  Stable diffuse wheezing, not stable for discharge   Difficult to place patient No      Time Spent in minutes 35 minutes  Procedures:  None  Consultants:   Pulmonology  Antimicrobials:   Anti-infectives (From admission, onward)   Start     Dose/Rate Route Frequency Ordered Stop   07/14/20 0645  cefTRIAXone (ROCEPHIN) 1 g in sodium chloride 0.9 % 100 mL IVPB        1 g 200 mL/hr over 30 Minutes Intravenous Every 24 hours 07/14/20 0549 07/19/20 0644         Medications  Scheduled Meds: . aspirin EC  81 mg Oral Daily  . DULoxetine  60 mg Oral Daily  . fluticasone furoate-vilanterol  1 puff Inhalation Daily   And  . umeclidinium bromide  1 puff Inhalation Daily  . gabapentin  600 mg Oral TID  . insulin aspart  0-15 Units Subcutaneous TID WC  . insulin aspart   0-5 Units Subcutaneous QHS  . insulin aspart  4 Units Subcutaneous TID WC  . ipratropium-albuterol  3 mL Nebulization BID  . metoprolol tartrate  12.5 mg Oral BID  . nicotine  21 mg Transdermal Daily  . pantoprazole  40 mg Oral BID  . predniSONE  40 mg Oral Q breakfast  . rosuvastatin  10 mg Oral QPM  . sodium chloride flush  3 mL Intravenous Q12H  . tamsulosin  0.8 mg Oral Daily  . topiramate  50 mg Oral BID  . torsemide  20 mg Oral Daily  . warfarin  6 mg Oral ONCE-1600  . Warfarin - Pharmacist Dosing Inpatient   Does not apply q1600   Continuous Infusions: . sodium chloride 250 mL (07/16/20 0455)  . cefTRIAXone (ROCEPHIN)  IV 1 g (07/17/20 0538)   PRN Meds:.sodium chloride, acetaminophen **OR** acetaminophen, albuterol, albuterol, HYDROcodone-acetaminophen, sodium chloride flush, tiZANidine      Subjective:   Jose [name] was seen and examined today.  Still short of breath on exertion, bilateral wheezing, not close to baseline yet. Patient denies dizziness, chest pain,  abdominal pain, N/V/D/C, new weakness, numbess, tingling. No acute events overnight.    Objective:   Vitals:   07/17/20 0440 07/17/20 0721 07/17/20 1208 07/17/20 1504  BP: 135/75 116/68 (!) 147/123 (!) 152/85  Pulse: 68 70 71 78  Resp: Temp: 97.6 F (36.4 C) 97.7 F (36.5 C) 97.7 F (36.5 C) 98.4 F (36.9 C)  TempSrc: Oral Oral Oral Oral  SpO2: 94% 98% 98% 98%  Weight:  108.8 kg    Height:        Intake/Output Summary (Last 24 hours) at 07/17/2020 1642 Last data filed at 07/17/2020 1635 Gross per 24 hour  Intake 720 ml  Output 1975 ml  Net -1255 ml     Wt Readings from Last 3 Encounters:  07/17/20 108.8 kg  01/21/20 112.2 kg  01/09/20 120.7 kg     Exam  General: Alert and oriented x 3, NAD  Cardiovascular: S1 S2 auscultated, no murmurs, RRR  Respiratory: Diffuse wheezing bilaterally  Gastrointestinal: Soft, nontender, nondistended, + bowel sounds  Ext: trace pedal  edema bilaterally  Neuro: no new deficits  Musculoskeletal: No digital cyanosis, clubbing  Skin: No rashes  Psych: Normal affect and demeanor, alert and oriented x3    Data Reviewed:  I have personally reviewed following labs and imaging studies  Micro Results Recent Results (from the past  240 hour(s))  Resp Panel by RT-PCR (Flu A&B, Covid) Nasopharyngeal Swab     Status: None   Collection Time: 07/13/20  4:32 PM   Specimen: Nasopharyngeal Swab; Nasopharyngeal(NP) swabs in vial transport medium  Result Value Ref Range Status   SARS Coronavirus 2 by RT PCR NEGATIVE NEGATIVE Final    Comment: (NOTE) SARS-CoV-2 target nucleic acids are NOT DETECTED.  The SARS-CoV-2 RNA is generally detectable in upper respiratory specimens during the acute phase of infection. The lowest concentration of SARS-CoV-2 viral copies this assay can detect is 138 copies/mL. A negative result does not preclude SARS-Cov-2 infection and should not be used as the sole basis for treatment or other patient management decisions. A negative result may occur with  improper specimen collection/handling, submission of specimen other than nasopharyngeal swab, presence of viral mutation(s) within the areas targeted by this assay, and inadequate number of viral copies(<138 copies/mL). A negative result must be combined with clinical observations, patient history, and epidemiological information. The expected result is Negative.  Fact Sheet for Patients:  BloggerCourse.com  Fact Sheet for Healthcare Providers:  SeriousBroker.it  This test is no t yet approved or cleared by the [place] FDA and  has been authorized for detection and/or diagnosis of SARS-CoV-2 by FDA under an Emergency Use Authorization (EUA). This EUA will remain  in effect (meaning this test can be used) for the duration of the COVID-19 declaration under Section 564(b)(1) of the Act,  21 U.S.C.section 360bbb-3(b)(1), unless the authorization is terminated  or revoked sooner.       Influenza A by PCR NEGATIVE NEGATIVE Final   Influenza B by PCR NEGATIVE NEGATIVE Final    Comment: (NOTE) The Xpert Xpress SARS-CoV-2/FLU/RSV plus assay is intended as an aid in the diagnosis of influenza from Nasopharyngeal swab specimens and should not be used as a sole basis for treatment. Nasal washings and aspirates are unacceptable for Xpert Xpress SARS-CoV-2/FLU/RSV testing.  Fact Sheet for Patients: BloggerCourse.com  Fact Sheet for Healthcare Providers: SeriousBroker.it  This test is not yet approved or cleared by the [place] FDA and has been authorized for detection and/or diagnosis of SARS-CoV-2 by FDA under an Emergency Use Authorization (EUA). This EUA will remain in effect (meaning this test can be used) for the duration of the COVID-19 declaration under Section 564(b)(1) of the Act, 21 U.S.C. section 360bbb-3(b)(1), unless the authorization is terminated or revoked.  Performed at [facility], [address] [place], [place] [address]   Blood culture (routine single)     Status: None (Preliminary result)   Collection Time: 07/13/20  5:23 PM   Specimen: BLOOD  Result Value Ref Range Status   Specimen Description BLOOD RIGHT ANTECUBITAL  Final   Special Requests   Final    BOTTLES DRAWN AEROBIC AND ANAEROBIC Blood Culture adequate volume   Culture   Final    NO GROWTH 4 DAYS Performed at [facility], [address], [place], [place] [address]    Report Status PENDING  Incomplete    Radiology Reports CT Angio Chest PE W and/or Wo Contrast  Result Date: 07/13/2020 CLINICAL DATA:  Increasing shortness of breath for 10 days, COPD EXAM: CT ANGIOGRAPHY CHEST WITH CONTRAST TECHNIQUE: Multidetector CT imaging of the chest was performed using the standard protocol during bolus administration  of intravenous contrast. Multiplanar CT image reconstructions and MIPs were obtained to evaluate the vascular anatomy. CONTRAST:  35mL OMNIPAQUE IOHEXOL 350 MG/ML SOLN COMPARISON:  07/13/2020, 12/27/2018 FINDINGS: Cardiovascular:  This is a technically adequate evaluation of the pulmonary vasculature. There are no filling defects or pulmonary emboli. The heart is unremarkable without pericardial effusion. No evidence of thoracic aortic aneurysm or dissection. Postsurgical changes are seen from median sternotomy and coronary artery bypass. Mild atherosclerosis of the aortic arch. Extensive atherosclerosis of the native coronary vessels. Moderate calcification of the aortic valve. Mediastinum/Nodes: No pathologically enlarged mediastinal, hilar, or axillary adenopathy. Thyroid gland, trachea, and esophagus demonstrate no significant findings. Lungs/Pleura: No acute airspace disease, effusion, or pneumothorax. Minimal scarring within the right lower lobe. Central airways are patent. Upper Abdomen: No acute abnormality. Musculoskeletal: No acute or destructive bony lesions. Reconstructed images demonstrate no additional findings. Review of the MIP images confirms the above findings. IMPRESSION: 1. No evidence of pulmonary embolus. 2. No acute intrathoracic process. 3.  Aortic Atherosclerosis (ICD10-I70.0). Electronically Signed   By: Sharlet Salina M.D.   On: 07/13/2020 19:25   DG Chest Portable 1 View  Result Date: 07/13/2020 CLINICAL DATA:  Shortness of breath and cough. EXAM: PORTABLE CHEST 1 VIEW COMPARISON:  01/18/2020 FINDINGS: Cardiomegaly and CABG changes again noted. Mild pulmonary vascular congestion is present. There is no evidence of focal airspace disease, pulmonary edema, suspicious pulmonary nodule/mass, pleural effusion, or pneumothorax. No acute bony abnormalities are identified. IMPRESSION: Cardiomegaly with mild pulmonary vascular congestion. Electronically Signed   By: Harmon Pier M.D.   On:  07/13/2020 17:08    Lab Data:  CBC: Recent Labs  Lab 07/13/20 1632 07/14/20 0600 07/17/20 0418  WBC 11.4* 7.3 13.8*  NEUTROABS 6.1 4.7  --   HGB 13.3 13.1 13.6  HCT 42.3 42.0 44.0  MCV 92.6 92.9 93.0  PLT 230 212 278   Basic Metabolic Panel: Recent Labs  Lab 07/13/20 1632 07/14/20 0600 07/17/20 0418  NA 135 140 139  K 4.4 4.0 3.8  CL 100 102 94*  CO2 28 30 33*  GLUCOSE 124* 183* 130*  BUN 17 14 33*  CREATININE 0.73 0.63 0.87  CALCIUM 8.4* 8.7* 9.2  MG 2.0 2.3  --   PHOS  --  3.9  --    GFR: Estimated Creatinine Clearance: 99.6 mL/min (by C-G formula based on SCr of 0.87 mg/dL). Liver Function Tests: Recent Labs  Lab 07/13/20 1632 07/14/20 0600  AST 38 27  ALT 36 32  ALKPHOS 68 60  BILITOT 0.6 0.5  PROT 7.8 7.7  ALBUMIN 3.6 3.5   No results for input(s): LIPASE, AMYLASE in the last 168 hours. No results for input(s): AMMONIA in the last 168 hours. Coagulation Profile: Recent Labs  Lab 07/13/20 1632 07/14/20 0500 07/15/20 0539 07/16/20 0424 07/17/20 0418  INR 2.2* 2.3* 2.9* 2.8* 2.4*   Cardiac Enzymes: No results for input(s): CKTOTAL, CKMB, CKMBINDEX, TROPONINI in the last 168 hours. BNP (last 3 results) No results for input(s): PROBNP in the last 8760 hours. HbA1C: Recent Labs    07/17/20 0418  HGBA1C 8.0*   CBG: Recent Labs  Lab 07/17/20 0011 07/17/20 0445 07/17/20 0721 07/17/20 1211 07/17/20 1634  GLUCAP 150* 140* 133* 142* 325*   Lipid Profile: No results for input(s): CHOL, HDL, LDLCALC, TRIG, CHOLHDL, LDLDIRECT in the last 72 hours. Thyroid Function Tests: No results for input(s): TSH, T4TOTAL, FREET4, T3FREE, THYROIDAB in the last 72 hours. Anemia Panel: No results for input(s): VITAMINB12, FOLATE, FERRITIN, TIBC, IRON, RETICCTPCT in the last 72 hours. Urine analysis:    Component Value Date/Time   COLORURINE YELLOW 01/20/2020 1031   APPEARANCEUR CLEAR 01/20/2020 1031  LABSPEC 1.014 01/20/2020 1031   PHURINE 5.0  01/20/2020 1031   GLUCOSEU NEGATIVE 01/20/2020 1031   HGBUR LARGE (A) 01/20/2020 1031   BILIRUBINUR NEGATIVE 01/20/2020 1031   KETONESUR NEGATIVE 01/20/2020 1031   PROTEINUR 30 (A) 01/20/2020 1031   NITRITE NEGATIVE 01/20/2020 1031   LEUKOCYTESUR NEGATIVE 01/20/2020 1031     Tyrice Hewitt M.D. [facility] 07/17/2020, 4:42 PM  Available via Epic secure chat 7am-7pm After 7 pm, please refer to night coverage provider listed on amion.

## 2020-07-18 LAB — CULTURE, BLOOD (SINGLE)
Culture: NO GROWTH
Special Requests: ADEQUATE

## 2020-07-18 LAB — PROTIME-INR
INR: 2.2 — ABNORMAL HIGH (ref 0.8–1.2)
Prothrombin Time: 24.4 seconds — ABNORMAL HIGH (ref 11.4–15.2)

## 2020-07-18 LAB — GLUCOSE, CAPILLARY
Glucose-Capillary: 149 mg/dL — ABNORMAL HIGH (ref 70–99)
Glucose-Capillary: 181 mg/dL — ABNORMAL HIGH (ref 70–99)
Glucose-Capillary: 263 mg/dL — ABNORMAL HIGH (ref 70–99)
Glucose-Capillary: 199 mg/dL — ABNORMAL HIGH (ref 70–99)

## 2020-07-18 MED ORDER — WARFARIN SODIUM 6 MG PO TABS
6.0000 mg | ORAL_TABLET | Freq: Once | ORAL | Status: AC
  Administered 2020-07-18: 6 mg via ORAL
  Filled 2020-07-18: qty 1

## 2020-07-18 MED ORDER — BENZONATATE 100 MG PO CAPS
100.0000 mg | ORAL_CAPSULE | Freq: Three times a day (TID) | ORAL | Status: DC
  Administered 2020-07-18 – 2020-07-19 (×6): 100 mg via ORAL
  Filled 2020-07-18 (×6): qty 1

## 2020-07-18 MED ORDER — GUAIFENESIN-CODEINE 100-10 MG/5ML PO SOLN: 5.0000 mL | Freq: Four times a day (QID) | ORAL | Status: DC | PRN

## 2020-07-18 NOTE — Progress Notes (Signed)
Patient has refused cpap. He states he has one at home that he wears but does not want to wear one tonight

## 2020-07-18 NOTE — Plan of Care (Signed)
Pt rested during overnight. No tele events. Denies pain. Edema +1 BLE with dry/cracking and vertical wrinkles. Pt sating 100% on 3L/[place]. Vitals stable. MEWS 0 Problem: Education: Goal: Knowledge of General Education information will improve Description: Including pain rating scale, medication(s)/side effects and non-pharmacologic comfort measures Outcome: Progressing   Problem: Health Behavior/Discharge Planning: Goal: Ability to manage health-related needs will improve Outcome: Progressing   Problem: Clinical Measurements: Goal: Ability to maintain clinical measurements within normal limits will improve Outcome: Progressing Goal: Will remain free from infection Outcome: Progressing Goal: Diagnostic test results will improve Outcome: Progressing Goal: Respiratory complications will improve Outcome: Progressing Goal: Cardiovascular complication will be avoided Outcome: Progressing   Problem: Activity: Goal: Risk for activity intolerance will decrease Outcome: Progressing   Problem: Nutrition: Goal: Adequate nutrition will be maintained Outcome: Progressing   Problem: Coping: Goal: Level of anxiety will decrease Outcome: Progressing   Problem: Elimination: Goal: Will not experience complications related to bowel motility Outcome: Progressing Goal: Will not experience complications related to urinary retention Outcome: Progressing   Problem: Pain Managment: Goal: General experience of comfort will improve Outcome: Progressing   Problem: Safety: Goal: Ability to remain free from injury will improve Outcome: Progressing   Problem: Skin Integrity: Goal: Risk for impaired skin integrity will decrease Outcome: Progressing   Problem: Education: Goal: Ability to demonstrate management of disease process will improve Outcome: Progressing Goal: Ability to verbalize understanding of medication therapies will improve Outcome: Progressing Goal: Individualized Educational  Video(s) Outcome: Progressing   Problem: Activity: Goal: Capacity to carry out activities will improve Outcome: Progressing   Problem: Cardiac: Goal: Ability to achieve and maintain adequate cardiopulmonary perfusion will improve Outcome: Progressing

## 2020-07-18 NOTE — Progress Notes (Signed)
[facility]                                                                              Patient Demographics  Jose BaliJeffrey [name], is a 65 y.o. male, DOB - 04/08/1955, ZOX:[id number]  Admit date - 07/13/2020   Admitting Physician Jose DoyneAnastassia Doutova, MD  Outpatient Primary MD for the patient is Jose [name], Jose E, MD  Outpatient specialists:   LOS - 5  days   Medical records reviewed and are as summarized below:    Chief Complaint  Patient presents with  . Shortness of Breath       Brief summary   Patient is a 65 year old male with recent hospitalization for pneumonia in November 2021, COPD, chronic diastolic CHF, CAD s/p PCI with stent, antiphospholipid syndrome, history of pulmonary embolism on Coumadin, type 2 DM, OSA on CPAP at night, chronic hypoxic and hypercapnic respiratory failure on 2 L/min oxygen at night and BiPAP at night (recently converted to CPAP), carotid artery disease, RBBB, morbid obesity, hyperlipidemia.    Patient presented with shortness of breath, wheezing and hypoxia with O2 sats 76% on room air. He was admitted to the hospital for COPD exacerbation and acute on chronic hypoxic respiratory failure   Assessment & Plan    Principal Problem:   Acute on chronic respiratory failure with hypoxia and hypercapnia (HCC) -Continue CPAP at night, wheezing is improving, on 3 L O2, sats 95 to 99%, weaned O2 to 2 L. -  Patient reports that he uses home O2 at night and CPAP.  Likely will need O2 continuous, added home O2 evaluation. -Pulmonology following -Continue prednisone, IV Rocephin, scheduled nebs.  Added Tessalon Perles -Hopefully DC home in next 24 to 48 hours  Active Problems: Acute on chronic diastolic CHF -Received IV Lasix on 5/3.  -Negative balance of 8 L.  Continue torsemide 20 mg daily (at home taking torsemide 40 mg daily) -2D echo 02/2019 had shown EF of 55 to 60% with G1 DD   OSA  -Per pulmonology, while inpatient placed back on  BiPAP settings -Patient will be following [facility] for switching his BiPAP to auto CPAP.  [facility]  History of pulmonary embolism in 2011, antiphospholipid syndrome -Continue Coumadin per pharmacy -INR therapeutic  History of CAD status post PCI with stent -Currently no chest pain, continue aspirin and statin  Diabetes mellitus type 2, not on long-term insulin, history of CAD -Ozempic on hold, CBGs currently uncontrolled due to steroids -Continue sliding scale insulin, continue NovoLog meal coverage 4 units 3 times daily AC  -Hemoglobin A1c 7.7   Obesity Estimated body mass index is 37.57 kg/m as calculated from the following:   Height as of this encounter: 5\' 7"  (1.702 m).   Weight as of this encounter: 108.8 kg.  Code Status: DNR DVT Prophylaxis:   warfarin (COUMADIN) tablet 6 mg   Level of Care: Level of care: Med-Surg Family Communication: Discussed all imaging results, lab results, explained to the patient   Disposition Plan:     Status is: Inpatient  Remains inpatient appropriate because:Inpatient level of care appropriate due to severity  of illness   Dispo: The patient is from: Home              Anticipated d/c is to: Home              Patient currently is not medically stable to d/c.  Weaning O2, still some wheezing, hopefully will be able to DC home tomorrow.  PT OT, home O2 evaluation today   Difficult to place patient No      Time Spent in minutes 35 minutes  Procedures:  None  Consultants:   Pulmonology  Antimicrobials:   Anti-infectives (From admission, onward)   Start     Dose/Rate Route Frequency Ordered Stop   07/14/20 0645  cefTRIAXone (ROCEPHIN) 1 g in sodium chloride 0.9 % 100 mL IVPB        1 g 200 mL/hr over 30 Minutes Intravenous Every 24 hours 07/14/20 0549 07/18/20 0654         Medications  Scheduled Meds: . aspirin EC  81 mg Oral Daily  . benzonatate  100 mg Oral TID  .  DULoxetine  60 mg Oral Daily  . fluticasone furoate-vilanterol  1 puff Inhalation Daily   And  . umeclidinium bromide  1 puff Inhalation Daily  . gabapentin  600 mg Oral TID  . insulin aspart  0-15 Units Subcutaneous TID WC  . insulin aspart  0-5 Units Subcutaneous QHS  . insulin aspart  4 Units Subcutaneous TID WC  . ipratropium-albuterol  3 mL Nebulization BID  . metoprolol tartrate  12.5 mg Oral BID  . nicotine  21 mg Transdermal Daily  . pantoprazole  40 mg Oral BID  . rosuvastatin  10 mg Oral QPM  . sodium chloride flush  3 mL Intravenous Q12H  . tamsulosin  0.8 mg Oral Daily  . topiramate  50 mg Oral BID  . torsemide  20 mg Oral Daily  . warfarin  6 mg Oral ONCE-1600  . Warfarin - Pharmacist Dosing Inpatient   Does not apply q1600   Continuous Infusions: . sodium chloride 250 mL (07/16/20 0455)   PRN Meds:.sodium chloride, acetaminophen **OR** acetaminophen, albuterol, albuterol, guaiFENesin-codeine, HYDROcodone-acetaminophen, sodium chloride flush, tiZANidine      Subjective:   Jose [name] was seen and examined today.  Sitting up in the chair, having coughing, dry.  Shortness of breath still but improving.  Patient denies dizziness, chest pain,  abdominal pain, N/V/D/C. No acute events overnight.    Objective:   Vitals:   07/18/20 0435 07/18/20 0736 07/18/20 0824 07/18/20 1138  BP: 132/60  102/70 (!) 97/54  Pulse: 67  (!) 106 77  Resp: 20  17 16   Temp: (!) 97.5 F (36.4 C)  98 F (36.7 C) (!) 97.5 F (36.4 C)  TempSrc: Oral  Oral Oral  SpO2: 100% 98% 99% 97%  Weight:      Height:        Intake/Output Summary (Last 24 hours) at 07/18/2020 1323 Last data filed at 07/17/2020 2156 Gross per 24 hour  Intake 600 ml  Output 2800 ml  Net -2200 ml     Wt Readings from Last 3 Encounters:  07/17/20 108.8 kg  01/21/20 112.2 kg  01/09/20 120.7 kg    Physical Exam  General: Alert and oriented x 3, NAD, cough  Cardiovascular: S1 S2 clear, RRR. trace pedal  edema b/l  Respiratory: Mild scattered wheezing bilaterally, noticed worse during coughing  Gastrointestinal: Soft, nontender, nondistended, NBS  Ext: trace pedal edema bilaterally  Neuro: no new deficits  Musculoskeletal: No cyanosis, clubbing  Skin: No rashes  Psych: Normal affect and demeanor, alert and oriented x3   Data Reviewed:  I have personally reviewed following labs and imaging studies  Micro Results Recent Results (from the past 240 hour(s))  Resp Panel by RT-PCR (Flu A&B, Covid) Nasopharyngeal Swab     Status: None   Collection Time: 07/13/20  4:32 PM   Specimen: Nasopharyngeal Swab; Nasopharyngeal(NP) swabs in vial transport medium  Result Value Ref Range Status   SARS Coronavirus 2 by RT PCR NEGATIVE NEGATIVE Final    Comment: (NOTE) SARS-CoV-2 target nucleic acids are NOT DETECTED.  The SARS-CoV-2 RNA is generally detectable in upper respiratory specimens during the acute phase of infection. The lowest concentration of SARS-CoV-2 viral copies this assay can detect is 138 copies/mL. A negative result does not preclude SARS-Cov-2 infection and should not be used as the sole basis for treatment or other patient management decisions. A negative result may occur with  improper specimen collection/handling, submission of specimen other than nasopharyngeal swab, presence of viral mutation(s) within the areas targeted by this assay, and inadequate number of viral copies(<138 copies/mL). A negative result must be combined with clinical observations, patient history, and epidemiological information. The expected result is Negative.  Fact Sheet for Patients:  BloggerCourse.com  Fact Sheet for Healthcare Providers:  SeriousBroker.it  This test is no t yet approved or cleared by the [place] FDA and  has been authorized for detection and/or diagnosis of SARS-CoV-2 by FDA under an Emergency Use Authorization  (EUA). This EUA will remain  in effect (meaning this test can be used) for the duration of the COVID-19 declaration under Section 564(b)(1) of the Act, 21 U.S.C.section 360bbb-3(b)(1), unless the authorization is terminated  or revoked sooner.       Influenza A by PCR NEGATIVE NEGATIVE Final   Influenza B by PCR NEGATIVE NEGATIVE Final    Comment: (NOTE) The Xpert Xpress SARS-CoV-2/FLU/RSV plus assay is intended as an aid in the diagnosis of influenza from Nasopharyngeal swab specimens and should not be used as a sole basis for treatment. Nasal washings and aspirates are unacceptable for Xpert Xpress SARS-CoV-2/FLU/RSV testing.  Fact Sheet for Patients: BloggerCourse.com  Fact Sheet for Healthcare Providers: SeriousBroker.it  This test is not yet approved or cleared by the [place] FDA and has been authorized for detection and/or diagnosis of SARS-CoV-2 by FDA under an Emergency Use Authorization (EUA). This EUA will remain in effect (meaning this test can be used) for the duration of the COVID-19 declaration under Section 564(b)(1) of the Act, 21 U.S.C. section 360bbb-3(b)(1), unless the authorization is terminated or revoked.  Performed at [facility], [address] [place], [place] [address]   Blood culture (routine single)     Status: None   Collection Time: 07/13/20  5:23 PM   Specimen: BLOOD  Result Value Ref Range Status   Specimen Description BLOOD RIGHT ANTECUBITAL  Final   Special Requests   Final    BOTTLES DRAWN AEROBIC AND ANAEROBIC Blood Culture adequate volume   Culture   Final    NO GROWTH 5 DAYS Performed at [facility], [address], [place], [place] [address]    Report Status 07/18/2020 FINAL  Final    Radiology Reports CT Angio Chest PE W and/or Wo Contrast  Result Date: 07/13/2020 CLINICAL DATA:  Increasing shortness of breath for 10 days, COPD EXAM: CT ANGIOGRAPHY  CHEST WITH CONTRAST TECHNIQUE: Multidetector  CT imaging of the chest was performed using the standard protocol during bolus administration of intravenous contrast. Multiplanar CT image reconstructions and MIPs were obtained to evaluate the vascular anatomy. CONTRAST:  60mL OMNIPAQUE IOHEXOL 350 MG/ML SOLN COMPARISON:  07/13/2020, 12/27/2018 FINDINGS: Cardiovascular: This is a technically adequate evaluation of the pulmonary vasculature. There are no filling defects or pulmonary emboli. The heart is unremarkable without pericardial effusion. No evidence of thoracic aortic aneurysm or dissection. Postsurgical changes are seen from median sternotomy and coronary artery bypass. Mild atherosclerosis of the aortic arch. Extensive atherosclerosis of the native coronary vessels. Moderate calcification of the aortic valve. Mediastinum/Nodes: No pathologically enlarged mediastinal, hilar, or axillary adenopathy. Thyroid gland, trachea, and esophagus demonstrate no significant findings. Lungs/Pleura: No acute airspace disease, effusion, or pneumothorax. Minimal scarring within the right lower lobe. Central airways are patent. Upper Abdomen: No acute abnormality. Musculoskeletal: No acute or destructive bony lesions. Reconstructed images demonstrate no additional findings. Review of the MIP images confirms the above findings. IMPRESSION: 1. No evidence of pulmonary embolus. 2. No acute intrathoracic process. 3.  Aortic Atherosclerosis (ICD10-I70.0). Electronically Signed   By: Sharlet Salina M.D.   On: 07/13/2020 19:25   DG Chest Portable 1 View  Result Date: 07/13/2020 CLINICAL DATA:  Shortness of breath and cough. EXAM: PORTABLE CHEST 1 VIEW COMPARISON:  01/18/2020 FINDINGS: Cardiomegaly and CABG changes again noted. Mild pulmonary vascular congestion is present. There is no evidence of focal airspace disease, pulmonary edema, suspicious pulmonary nodule/mass, pleural effusion, or pneumothorax. No acute bony abnormalities  are identified. IMPRESSION: Cardiomegaly with mild pulmonary vascular congestion. Electronically Signed   By: Harmon Pier M.D.   On: 07/13/2020 17:08    Lab Data:  CBC: Recent Labs  Lab 07/13/20 1632 07/14/20 0600 07/17/20 0418  WBC 11.4* 7.3 13.8*  NEUTROABS 6.1 4.7  --   HGB 13.3 13.1 13.6  HCT 42.3 42.0 44.0  MCV 92.6 92.9 93.0  PLT 230 212 278   Basic Metabolic Panel: Recent Labs  Lab 07/13/20 1632 07/14/20 0600 07/17/20 0418  NA 135 140 139  K 4.4 4.0 3.8  CL 100 102 94*  CO2 28 30 33*  GLUCOSE 124* 183* 130*  BUN 17 14 33*  CREATININE 0.73 0.63 0.87  CALCIUM 8.4* 8.7* 9.2  MG 2.0 2.3  --   PHOS  --  3.9  --    GFR: Estimated Creatinine Clearance: 99.6 mL/min (by C-G formula based on SCr of 0.87 mg/dL). Liver Function Tests: Recent Labs  Lab 07/13/20 1632 07/14/20 0600  AST 38 27  ALT 36 32  ALKPHOS 68 60  BILITOT 0.6 0.5  PROT 7.8 7.7  ALBUMIN 3.6 3.5   No results for input(s): LIPASE, AMYLASE in the last 168 hours. No results for input(s): AMMONIA in the last 168 hours. Coagulation Profile: Recent Labs  Lab 07/14/20 0500 07/15/20 0539 07/16/20 0424 07/17/20 0418 07/18/20 0356  INR 2.3* 2.9* 2.8* 2.4* 2.2*   Cardiac Enzymes: No results for input(s): CKTOTAL, CKMB, CKMBINDEX, TROPONINI in the last 168 hours. BNP (last 3 results) No results for input(s): PROBNP in the last 8760 hours. HbA1C: Recent Labs    07/17/20 0418  HGBA1C 8.0*   CBG: Recent Labs  Lab 07/17/20 1211 07/17/20 1634 07/17/20 2155 07/18/20 0817 07/18/20 1133  GLUCAP 142* 325* 247* 149* 181*   Lipid Profile: No results for input(s): CHOL, HDL, LDLCALC, TRIG, CHOLHDL, LDLDIRECT in the last 72 hours. Thyroid Function Tests: No results for input(s): TSH, T4TOTAL, FREET4,  T3FREE, THYROIDAB in the last 72 hours. Anemia Panel: No results for input(s): VITAMINB12, FOLATE, FERRITIN, TIBC, IRON, RETICCTPCT in the last 72 hours. Urine analysis:    Component Value  Date/Time   COLORURINE YELLOW 01/20/2020 1031   APPEARANCEUR CLEAR 01/20/2020 1031   LABSPEC 1.014 01/20/2020 1031   PHURINE 5.0 01/20/2020 1031   GLUCOSEU NEGATIVE 01/20/2020 1031   HGBUR LARGE (A) 01/20/2020 1031   BILIRUBINUR NEGATIVE 01/20/2020 1031   KETONESUR NEGATIVE 01/20/2020 1031   PROTEINUR 30 (A) 01/20/2020 1031   NITRITE NEGATIVE 01/20/2020 1031   LEUKOCYTESUR NEGATIVE 01/20/2020 1031     Alfonso Carden M.D. [facility] 07/18/2020, 1:23 PM  Available via Epic secure chat 7am-7pm After 7 pm, please refer to night coverage provider listed on amion.

## 2020-07-18 NOTE — Progress Notes (Signed)
Physical Therapy Treatment Patient Details Name: Jose [name] MRN: [id number] DOB: 01-19-1956 Today's Date: 07/18/2020    History of Present Illness Jose [name]  is a 65 yo male admitted to [facility] on 07/13/20 for [name]/o worsening SOB over the past 10 days. Pt hypoxic with SpO2 in 70's upon arrival; pt states he does not currently use O2 at home during the day. PMH significant for: T2DM, COPD, hx PE, CAD, CHF, RBBB and HTN.  MD assessment includes: COPD exacerbation, acute on chronic hypoxic respiratory failure, and prolonged QT interval likely due to right bundle branch block.    PT Comments    Pt in chair upon arrival, agreeable to session. Pt able to progress AMB to >134ft, still requires 3L O2 or more, where he has been staying on RA at home PTA. Pt would still benefit from pulmonary rehab at DC for improve education on use of O2 to support daily mobility needs and exercise. Will continue to follow.     Follow Up Recommendations  Home health PT;Supervision - Intermittent     Equipment Recommendations  None recommended by PT    Recommendations for Other Services       Precautions / Restrictions Precautions Precautions: Fall Precaution Comments: Keep SpO2 > 92% Restrictions Weight Bearing Restrictions: No Other Position/Activity Restrictions: Keep SpO2 > 92%    Mobility  Bed Mobility               General bed mobility comments: in recliner    Transfers Overall transfer level: Modified independent Equipment used: None Transfers: Sit to/from Stand Sit to Stand: Modified independent (Device/Increase time)         General transfer comment: maintains balance while Chartered loss adjuster assists with pericare  Ambulation/Gait Ambulation/Gait assistance: Min guard;Supervision Gait Distance (Feet): 200 Feet Assistive device: IV Pole Gait Pattern/deviations: WFL(Within Functional Limits) Gait velocity: 0.18m/s   General Gait Details: periodic pauses to recover saturations, maintained  on 3-4L O2, sats in 90s most of time.   Stairs             Wheelchair Mobility    Modified Rankin (Stroke Patients Only)       Balance Overall balance assessment: Modified Independent;No apparent balance deficits (not formally assessed) (per pt fairly close to baseline)                                          Cognition Arousal/Alertness: Awake/alert Behavior During Therapy: WFL for tasks assessed/performed Overall Cognitive Status: Within Functional Limits for tasks assessed                                        Exercises Other Exercises Other Exercises: AM to hall back back unsupported for balance training/assessment: ~61ft, LOB    General Comments        Pertinent Vitals/Pain Pain Assessment: No/denies pain    Home Living                      Prior Function            PT Goals (current goals can now be found in the care plan section) Acute Rehab PT Goals Patient Stated Goal: Improved strength and endurance PT Goal Formulation: With patient Time For Goal Achievement: 07/28/20 Potential to Achieve Goals: Fair Progress  towards PT goals: Progressing toward goals    Frequency    Min 2X/week      PT Plan Current plan remains appropriate    Co-evaluation              AM-PAC PT "6 Clicks" Mobility   Outcome Measure  Help needed turning from your back to your side while in a flat bed without using bedrails?: None Help needed moving from lying on your back to sitting on the side of a flat bed without using bedrails?: None Help needed moving to and from a bed to a chair (including a wheelchair)?: None Help needed standing up from a chair using your arms (e.g., wheelchair or bedside chair)?: None Help needed to walk in hospital room?: A Little Help needed climbing 3-5 steps with a railing? : A Little 6 Click Score: 22    End of Session Equipment Utilized During Treatment: Gait belt;Oxygen Activity  Tolerance: Patient tolerated treatment well;No increased pain;Patient limited by fatigue Patient left: in chair;with call bell/phone within reach Nurse Communication: Mobility status PT Visit Diagnosis: Difficulty in walking, not elsewhere classified (R26.2)     Time: 2446-2863 PT Time Calculation (min) (ACUTE ONLY): 24 min  Charges:  $Therapeutic Exercise: 23-37 mins                     11:34 AM, 07/18/20 Rosamaria Lints, PT, DPT Physical Therapist - [facility]  [phone] (ASCOM)    Jose [name] 07/18/2020, 11:31 AM

## 2020-07-18 NOTE — Progress Notes (Signed)
ANTICOAGULATION CONSULT NOTE  Pharmacy Consult for Warfarin  Indication: continuation of therapy for VTE  Patient Measurements: Height: 5\' 7"  (170.2 cm) Weight: 108.8 kg (239 lb 13.8 oz) IBW/kg (Calculated) : 66.1  Labs: Recent Labs    07/16/20 0424 07/17/20 0418 07/18/20 0356  HGB  --  13.6  --   HCT  --  44.0  --   PLT  --  278  --   LABPROT 29.4* 26.5* 24.4*  INR 2.8* 2.4* 2.2*  CREATININE  --  0.87  --     Estimated Creatinine Clearance: 99.6 mL/min (by C-G formula based on SCr of 0.87 mg/dL).   Medical History: Past Medical History:  Diagnosis Date  . Antiphospholipid antibody syndrome (HCC)   . CAD S/P percutaneous coronary angioplasty '98, '04, Feb 2017   [facility]  . Carotid arterial disease (HCC) 10/2015   moderate bilateral  . COPD (chronic obstructive pulmonary disease) (HCC)   . Coronary artery disease   . Hypertension   . Non-insulin treated type 2 diabetes mellitus (HCC)   . Pulmonary emboli (HCC) 2011  . RBBB   . Sleep apnea    Assessment: Patient is a 65yo male admitted with shortness of breath. 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   Home Dose: 7mg  on Monday and Friday                       6mg  all other days  INR on Admission: 2.2   DDI: Ceftriaxone (increase INR), duloxetine (increase risk of bleeding), asa (increase risk of bleeding), torsemide (increase warfarin concentration)  Date INR Warfarin Dose  5/1 2.3 6 mg  5/2 2.9 4 mg   5/3 2.8 4 mg   5/4 2.4 6 mg  5/5 2.2 6 mg     Goal of Therapy:  INR 2-3   Plan:  Patient is therapeutic. Will order Warfarin 6mg  dose for this evening (home dose). Daily INR ordered. CBC at least every 3 days.   Friday, PharmD 07/18/2020 9:06 AM

## 2020-07-19 LAB — BASIC METABOLIC PANEL
Anion gap: 9 (ref 5–15)
BUN: 33 mg/dL — ABNORMAL HIGH (ref 8–23)
CO2: 35 mmol/L — ABNORMAL HIGH (ref 22–32)
Calcium: 9.1 mg/dL (ref 8.9–10.3)
Chloride: 97 mmol/L — ABNORMAL LOW (ref 98–111)
Creatinine, Ser: 0.85 mg/dL (ref 0.61–1.24)
GFR, Estimated: >60 mL/min (ref >60)
Glucose, Bld: 173 mg/dL — ABNORMAL HIGH (ref 70–99)
Potassium: 4.1 mmol/L (ref 3.5–5.1)
Sodium: 141 mmol/L (ref 135–145)

## 2020-07-19 LAB — CBC
HCT: 43.7 % (ref 39.0–52.0)
Hemoglobin: 13.8 g/dL (ref 13.0–17.0)
MCH: 29.3 pg (ref 26.0–34.0)
MCHC: 31.6 g/dL (ref 30.0–36.0)
MCV: 92.8 fL (ref 80.0–100.0)
Platelets: 297 10*3/uL (ref 150–400)
RBC: 4.71 MIL/uL (ref 4.22–5.81)
RDW: 14.7 % (ref 11.5–15.5)
WBC: 13.5 10*3/uL — ABNORMAL HIGH (ref 4.0–10.5)
nRBC: 0 % (ref 0.0–0.2)

## 2020-07-19 LAB — GLUCOSE, CAPILLARY
Glucose-Capillary: 119 mg/dL — ABNORMAL HIGH (ref 70–99)
Glucose-Capillary: 177 mg/dL — ABNORMAL HIGH (ref 70–99)
Glucose-Capillary: 267 mg/dL — ABNORMAL HIGH (ref 70–99)
Glucose-Capillary: 256 mg/dL — ABNORMAL HIGH (ref 70–99)

## 2020-07-19 LAB — PROTIME-INR
INR: 2.2 — ABNORMAL HIGH (ref 0.8–1.2)
Prothrombin Time: 24.8 seconds — ABNORMAL HIGH (ref 11.4–15.2)

## 2020-07-19 MED ORDER — DOXYCYCLINE HYCLATE 100 MG PO TABS: 100.0000 mg | ORAL_TABLET | Freq: Two times a day (BID) | ORAL | 0 refills | Status: AC

## 2020-07-19 MED ORDER — BENZONATATE 200 MG PO CAPS: 200.0000 mg | ORAL_CAPSULE | Freq: Three times a day (TID) | ORAL | 0 refills | Status: DC | PRN

## 2020-07-19 MED ORDER — GUAIFENESIN-CODEINE 100-10 MG/5ML PO SOLN: 5.0000 mL | Freq: Four times a day (QID) | ORAL | 0 refills | Status: DC | PRN

## 2020-07-19 MED ORDER — DOXYCYCLINE HYCLATE 100 MG PO TABS
100.0000 mg | ORAL_TABLET | Freq: Two times a day (BID) | ORAL | Status: DC
  Administered 2020-07-19 (×2): 100 mg via ORAL
  Filled 2020-07-19 (×2): qty 1

## 2020-07-19 MED ORDER — PREDNISONE 20 MG PO TABS
40.0000 mg | ORAL_TABLET | Freq: Every day | ORAL | Status: DC
  Administered 2020-07-19: 09:00:00 40 mg via ORAL
  Filled 2020-07-19: qty 2

## 2020-07-19 MED ORDER — WARFARIN SODIUM 6 MG PO TABS
7.0000 mg | ORAL_TABLET | Freq: Once | ORAL | Status: AC
  Administered 2020-07-19: 18:00:00 7 mg via ORAL
  Filled 2020-07-19: qty 1

## 2020-07-19 MED ORDER — PREDNISONE 10 MG PO TABS: ORAL_TABLET | ORAL | 0 refills | Status: DC

## 2020-07-19 NOTE — TOC Transition Note (Signed)
Transition of Care [facility]) - CM/SW Discharge Note   Patient Details  Name: Jose [name] MRN: [id number] Date of Birth: 1955-12-24  Transition of Care [facility]) CM/SW Contact:  Allayne Butcher, RN Phone Number: 07/19/2020, 2:59 PM   Clinical Narrative:    Patient has been medically cleared for discharge home with home health services through [facility] and to follow up outpatient with his pulmonologist.  Patient needs continuous oxygen at home.  Oxygen order had already been sent out from patient's pulmonary team at [facility].  [facility] has processed the order and has all necessary documentation.  [facility] is delivering the oxygen to the room here at the hospital and is in route.  Patient will need to call [facility]) when he gets home to have them pick up their oxygen equipment so [employer] can set up their equipment.   Patient reports that his wife is home sick and cannot come get him at discharge.  RNCM will provide patient with Taxi Voucher from [place] to transport him home.     Final next level of care: Home w Home Health Services Barriers to Discharge: Barriers Resolved   Patient Goals and CMS Choice Patient states their goals for this hospitalization and ongoing recovery are:: To return home with wife. CMS Medicare.gov Compare Post Acute Care list provided to:: Patient Choice offered to / list presented to : Patient  Discharge Placement                       Discharge Plan and Services     Post Acute Care Choice: Home Health          DME Arranged: Oxygen DME Agency: [facility] Date DME Agency Contacted: 07/19/20 Time DME Agency Contacted: 1430 Representative spoke with at DME Agency: Fayrene Fearing HH Arranged: PT,OT,RN HH Agency: [facility] Date HH Agency Contacted: 07/19/20 Time HH Agency Contacted: 1458 Representative spoke with at [facility] Agency: Barbara Cower  Social Determinants of Health (SDOH) Interventions     Readmission Risk  Interventions Readmission Risk Prevention Plan 07/16/2020  Transportation Screening Complete  PCP or Specialist Appt within 3-5 Days Complete  HRI or Home Care Consult Complete  Social Work Consult for Recovery Care Planning/Counseling Complete  Palliative Care Screening Not Applicable  Medication Review Oceanographer) Complete  Some recent data might be hidden

## 2020-07-19 NOTE — Progress Notes (Signed)
SATURATION QUALIFICATIONS: (This note is used to comply with regulatory documentation for home oxygen)  Patient Saturations on Room Air at Rest = 88%  Patient Saturations on Room Air while Ambulating = 76%  Patient Saturations on 3 Liters of oxygen while Ambulating = 88%  Patient able to ambulate 150 feet with 3L O2 and maintain O2 sats 88%.  No increased SOB, increased WOB noted.

## 2020-07-19 NOTE — Discharge Summary (Signed)
Physician Discharge Summary   Patient ID: Jose BaliJeffrey Pendergraph MRN: [id number] DOB/AGE: 65/03/1955 65 y.o.  Admit date: 07/13/2020 Discharge date: 07/19/2020  Primary Care Physician:  Ephriam JenkinsFowler, Walter E, MD   Recommendations for Outpatient Follow-up:  1. Follow up with PCP in 1-2 weeks 2. Continue prednisone taper, doxycycline 100 mg twice daily for 5 days 3. Continue torsemide 20 mg BID 4. [facility] to 2 weeks.  Home Health:  Equipment/Devices: DME home O2 3 L  Discharge Condition: stable CODE STATUS: DNR Diet recommendation: Carb modified diet   Discharge Diagnoses:    . Acute on chronic respiratory failure with hypoxia and hypercapnia (HCC) . Acute on chronic diastolic CHF (congestive heart failure) (HCC) . COPD exacerbation (HCC) . Tobacco abuse . Obstructive sleep apnea . RBBB (right bundle branch block) . Hypertension . Hyperlipidemia . History of pulmonary embolus (PE)-2011 . GERD (gastroesophageal reflux disease) . History of CAD status post PCI with stent . History of  anti-phospholipid syndrome (HCC) . Prolonged QT interval . Obesity   Consults:   Pulmonology    Allergies:   Allergies  Allergen Reactions  . Melatonin Anxiety  . Ramipril     cough     DISCHARGE MEDICATIONS: Allergies as of 07/19/2020      Reactions   Melatonin Anxiety   Ramipril    cough      Medication List    TAKE these medications   albuterol 108 (90 Base) MCG/ACT inhaler Commonly known as: VENTOLIN HFA Inhale 2 puffs into the lungs every 6 (six) hours as needed for wheezing or shortness of breath.   albuterol (5 MG/ML) 0.5% nebulizer solution Commonly known as: PROVENTIL Take 0.5 mLs (2.5 mg total) by nebulization every 6 (six) hours as needed for wheezing or shortness of breath.   aspirin 81 MG EC tablet Take 1 tablet (81 mg total) by mouth daily. Swallow whole. What changed:   when to take this  additional  instructions   benzonatate 200 MG capsule Commonly known as: TESSALON Take 1 capsule (200 mg total) by mouth 3 (three) times daily as needed for cough.   doxycycline 100 MG tablet Commonly known as: VIBRA-TABS Take 1 tablet (100 mg total) by mouth 2 (two) times daily for 5 days.   DULoxetine 60 MG capsule Commonly known as: CYMBALTA Take 60 mg by mouth daily.   Fluticasone-Umeclidin-Vilant 200-62.5-25 MCG/INH Aepb Inhale 1 puff into the lungs daily.   gabapentin 600 MG tablet Commonly known as: NEURONTIN Take 600 mg by mouth 3 (three) times daily.   guaiFENesin-codeine 100-10 MG/5ML syrup Take 5 mLs by mouth every 6 (six) hours as needed for cough.   metoprolol tartrate 12.5 mg Tabs tablet Commonly known as: LOPRESSOR Take 12.5 mg by mouth 2 (two) times daily.   mirtazapine 7.5 MG tablet Commonly known as: REMERON Take 7.5 mg by mouth at bedtime.   nicotine 21 mg/24hr patch Commonly known as: NICODERM CQ - dosed in mg/24 hours Place 21 mg onto the skin daily.   nitroGLYCERIN 0.4 MG SL tablet Commonly known as: NITROSTAT Place 0.4 mg under the tongue every 5 (five) minutes as needed for chest pain.   Ozempic (1 MG/DOSE) 4 MG/3ML Sopn Generic drug: Semaglutide (1 MG/DOSE) Inject 1 mg into the skin every Monday.   pantoprazole 40 MG tablet Commonly known as: PROTONIX Take 40 mg by mouth 2 (two) times daily.   predniSONE 10 MG tablet Commonly known as: DELTASONE Prednisone dosing: Take  Prednisone  (4 tabs) x 3 days, then taper to  (3 tabs) x 3 days, then  (2 tabs) x 3days, then  (1 tab) x 3days, then OFF.   rosuvastatin 10 MG tablet Commonly known as: CRESTOR Take 10 mg by mouth every evening.   tamsulosin 0.4 MG Caps capsule Commonly known as: FLOMAX Take 0.8 mg by mouth daily.   tiZANidine 4 MG tablet Commonly known as: ZANAFLEX Take 4 mg by mouth 2 (two) times daily as needed for muscle spasms.   topiramate 50 MG tablet Commonly known  as: TOPAMAX Take 75 mg by mouth 2 (two) times daily.   torsemide 20 MG tablet Commonly known as: DEMADEX Take 20 mg by mouth 2 (two) times daily.   warfarin 6 MG tablet Commonly known as: COUMADIN Take 6 mg by mouth See admin instructions. Take 1 tablet ( ) by mouth every Tuesday, Wednesday, Thursday, Saturday and /06/22 0745           Brief H and P: For complete details please refer to admission H and P, but in brief *Patient is a 65 year old male with recent hospitalization for pneumonia in November 2021, COPD, chronic diastolic CHF, CAD s/p PCI with stent, antiphospholipid syndrome, history of pulmonary embolism on Coumadin, type 2 DM, OSA on CPAP at night, chronic hypoxic and hypercapnic respiratory failure on 2 L/min oxygen at night and BiPAP at night (recently converted to CPAP), carotid artery disease, RBBB, morbid obesity, hyperlipidemia.   Patient  presented with shortness of breath, wheezing and hypoxia with O2 sats 76% on room air. He was admitted to the hospital for COPD exacerbation and acute on chronic hypoxic respiratory failure   Hospital Course:   Acute on chronic respiratory failure with hypoxia and hypercapnia (HCC) -Continue CPAP at night, wheezing has much improved, has chronic cough, follows [facility] pulmonology - Patient reports that he uses home O2 at night and CPAP.   -Pulmonology was consulted.  Patient was placed on scheduled nebs, Tessalon Perles, IV Rocephin, prednisone. -He has been requiring 3 L O2 continuous   Acute on chronic diastolic CHF -Received IV Lasix on 5/3, much improved.  -Continue torsemide 20 mg  twice daily, negative balance of 10.8 L -2D echo 02/2019 had shown EF of 55 to 60% with G1 DD -Weight on admission to 47.3lbs improved to 239 on 5/4   OSA  -Per pulmonology, while inpatient placed back on BiPAP settings -Patient will be following [facility] for switching his BiPAP to auto CPAP.  Patient will call for follow-up appointment with [facility] pulmonology.  History of pulmonary embolism in 2011, antiphospholipid syndrome -Continue Coumadin, INR 2.2  History of CAD status post PCI with stent -Currently no chest pain, continue aspirin and statin  Diabetes mellitus type 2, not on long-term insulin, history of CAD -Resume Ozempic, outpatient follow-up with PCP.   Obesity Estimated body mass index is 37.57 kg/m as calculated from the following:   Height as of this encounter: 5\' 7"  (1.702 m).   Weight as of this encounter: 108.8 kg.   Day of Discharge S: Sitting up in the chair, no acute complaints.  Coughing.  No phlegm.  No worsening chest pain or shortness of breath.  BP (!) 116/49 (BP Location: Left Arm)   Pulse 76   Temp 98.4 F (36.9 C) (Oral)   Resp 18   Ht 5\' 7"  (1.702 m)   Wt 108.8 kg   SpO2 98%   BMI 37.57 kg/m   Physical Exam: General: Alert and awake oriented x3 not  in any acute distress. CVS: S1-S2 clear no murmur rubs or gallops Chest: Bilateral scattered rhonchi with scattered wheezing, improving Abdomen: soft nontender, nondistended, normal bowel sounds Extremities: no cyanosis, clubbing or edema noted bilaterally Neuro: no new deficits    Get Medicines reviewed and adjusted: Please take all your medications with you for your next visit with your Primary MD  Please request your Primary MD to go over all hospital tests and procedure/radiological results at the follow up. Please ask your Primary MD to get all Hospital records sent to his/her office.  If you experience worsening of your admission symptoms, develop shortness of breath, life threatening emergency, suicidal or homicidal thoughts you must seek medical attention immediately by calling 911 or calling your MD immediately  if symptoms less severe.  You must read complete instructions/literature along with all the possible adverse reactions/side effects for all the Medicines you take and that have been prescribed to you. Take any new Medicines after you have completely understood and accept all the possible adverse reactions/side effects.   Do not drive when taking pain medications.   Do not take more than prescribed Pain, Sleep and Anxiety Medications  Special Instructions: If you have smoked or chewed Tobacco  in the last 2 yrs please stop smoking, stop any regular Alcohol  and or any Recreational drug use.  Wear Seat belts while driving.  Please note  You were cared for by a hospitalist during your hospital stay. Once you are discharged, your primary care physician will handle any further medical issues. Please note that NO REFILLS for any discharge medications will be authorized once you are discharged, as it is imperative that you return to your primary care physician (or establish a relationship with a primary care physician if you do not have one) for your aftercare needs so that they  can reassess your need for medications and monitor your lab values.   The results of significant diagnostics from this hospitalization (including imaging, microbiology, ancillary and laboratory) are listed below for reference.      Procedures/Studies:  CT Angio Chest PE W and/or Wo Contrast  Result Date: 07/13/2020  CLINICAL DATA:  Increasing shortness of breath for 10 days, COPD EXAM: CT ANGIOGRAPHY CHEST WITH CONTRAST TECHNIQUE: Multidetector CT imaging of the chest was performed using the standard protocol during bolus administration of intravenous contrast. Multiplanar CT image reconstructions and MIPs were obtained to evaluate the vascular anatomy. CONTRAST:  4mL OMNIPAQUE IOHEXOL 350 MG/ML SOLN COMPARISON:  07/13/2020, 12/27/2018 FINDINGS: Cardiovascular: This is a technically adequate evaluation of the pulmonary vasculature. There are no filling defects or pulmonary emboli. The heart is unremarkable without pericardial effusion. No evidence of thoracic aortic aneurysm or dissection. Postsurgical changes are seen from median sternotomy and coronary artery bypass. Mild atherosclerosis of the aortic arch. Extensive atherosclerosis of the native coronary vessels. Moderate calcification of the aortic valve. Mediastinum/Nodes: No pathologically enlarged mediastinal, hilar, or axillary adenopathy. Thyroid gland, trachea, and esophagus demonstrate no significant findings. Lungs/Pleura: No acute airspace disease, effusion, or pneumothorax. Minimal scarring within the right lower lobe. Central airways are patent. Upper Abdomen: No acute abnormality. Musculoskeletal: No acute or destructive bony lesions. Reconstructed images demonstrate no additional findings. Review of the MIP images confirms the above findings. IMPRESSION: 1. No evidence of pulmonary embolus. 2. No acute intrathoracic process. 3.  Aortic Atherosclerosis (ICD10-I70.0). Electronically Signed   By: Sharlet Salina M.D.   On: 07/13/2020 19:25    DG Chest Portable 1 View  Result Date: 07/13/2020 CLINICAL DATA:  Shortness of breath and cough. EXAM: PORTABLE CHEST 1 VIEW COMPARISON:  01/18/2020 FINDINGS: Cardiomegaly and CABG changes again noted. Mild pulmonary vascular congestion is present. There is no evidence of focal airspace disease, pulmonary edema, suspicious pulmonary nodule/mass, pleural effusion, or pneumothorax. No acute bony abnormalities are identified. IMPRESSION: Cardiomegaly with mild pulmonary vascular congestion. Electronically Signed   By: Harmon Pier M.D.   On: 07/13/2020 17:08       LAB RESULTS: Basic Metabolic Panel: Recent Labs  Lab 07/14/20 0600 07/17/20 0418 07/19/20 0421  NA 140 139 141  K 4.0 3.8 4.1  CL 102 94* 97*  CO2 30 33* 35*  GLUCOSE 183* 130* 173*  BUN 14 33* 33*  CREATININE 0.63 0.87 0.85  CALCIUM 8.7* 9.2 9.1  MG 2.3  --   --   PHOS 3.9  --   --    Liver Function Tests: Recent Labs  Lab 07/13/20 1632 07/14/20 0600  AST 38 27  ALT 36 32  ALKPHOS 68 60  BILITOT 0.6 0.5  PROT 7.8 7.7  ALBUMIN 3.6 3.5   No results for input(s): LIPASE, AMYLASE in the last 168 hours. No results for input(s): AMMONIA in the last 168 hours. CBC: Recent Labs  Lab 07/14/20 0600 07/17/20 0418 07/19/20 0421  WBC 7.3 13.8* 13.5*  NEUTROABS 4.7  --   --   HGB 13.1 13.6 13.8  HCT 42.0 44.0 43.7  MCV 92.9 93.0 92.8  PLT 212 278 297   Cardiac Enzymes: No results for input(s): CKTOTAL, CKMB, CKMBINDEX, TROPONINI in the last 168 hours. BNP: Invalid input(s): POCBNP CBG: Recent Labs  Lab 07/19/20 0819 07/19/20 1245  GLUCAP 119* 177*       Disposition and Follow-up: Discharge Instructions    (HEART FAILURE PATIENTS) Call MD:  Anytime you have any of the following symptoms: 1) 3 pound weight gain in 24 hours or 5 pounds in 1 week 2) shortness of breath, with or without a dry hacking cough 3) swelling in the hands, feet or stomach 4) if you have to sleep on extra pillows at night in order  to  breathe.   Complete by: As directed    Call MD for:   Complete by: As directed    Diet Carb Modified   Complete by: As directed    Increase activity slowly   Complete by: As directed        DISPOSITION: Home   DISCHARGE FOLLOW-UP  Follow-up Information    [name], MD. Schedule an appointment as soon as possible for a visit in 2 week(s).   Specialty: Internal Medicine Contact information: [address] [place] [address] [phone]        Maia Plan, MD. Schedule an appointment as soon as possible for a visit in 2 week(s).   Specialty: Internal Medicine Contact information: [address] [place] [address] [phone]                Time coordinating discharge:  35-minutes  Signed:   Thad Ranger M.D. [facility] 07/19/2020, 2:16 PM

## 2020-07-19 NOTE — Progress Notes (Signed)
ANTICOAGULATION CONSULT NOTE  Pharmacy Consult for Warfarin  Indication: continuation of therapy for VTE  Patient Measurements: Height: 5\' 7"  (170.2 cm) Weight: 108.8 kg (239 lb 13.8 oz) IBW/kg (Calculated) : 66.1  Labs: Recent Labs    07/17/20 0418 07/18/20 0356 07/19/20 0421  HGB 13.6  --  13.8  HCT 44.0  --  43.7  PLT 278  --  297  LABPROT 26.5* 24.4* 24.8*  INR 2.4* 2.2* 2.2*  CREATININE 0.87  --  0.85    Estimated Creatinine Clearance: 102 mL/min (by C-G formula based on SCr of 0.85 mg/dL).   Medical History: Past Medical History:  Diagnosis Date  . Antiphospholipid antibody syndrome (HCC)   . CAD S/P percutaneous coronary angioplasty '98, '04, Feb 2017   [facility]  . Carotid arterial disease (HCC) 10/2015   moderate bilateral  . COPD (chronic obstructive pulmonary disease) (HCC)   . Coronary artery disease   . Hypertension   . Non-insulin treated type 2 diabetes mellitus (HCC)   . Pulmonary emboli (HCC) 2011  . RBBB   . Sleep apnea    Assessment: Patient is a 65yo male admitted with shortness of breath. 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   Home Dose: 7mg  on Monday and Friday                       6mg  all other days  INR on Admission: 2.2   DDI: Ceftriaxone (increase INR), duloxetine (increase risk of bleeding), asa (increase risk of bleeding), torsemide (increase warfarin concentration)  Date INR Warfarin Dose  5/1 2.3 6 mg  5/2 2.9 4 mg   5/3 2.8 4 mg   5/4 2.4 6 mg  5/5 2.2 6 mg  5/6 2.2 7 mg     Goal of Therapy:  INR 2-3   Plan:  Patient is therapeutic. Will order Warfarin 7mg  dose for this evening (home dose). Daily INR ordered. CBC at least every 3 days.   Monday, PharmD 07/19/2020 9:07 AM

## 2020-07-19 NOTE — Care Management Important Message (Signed)
Important Message  Patient Details  Name: Jose [name] MRN: [id number] Date of Birth: Jul 11, 1955   Medicare Important Message Given:  Yes     [name] Kaho Selle 07/19/2020, 12:12 PM

## 2020-07-19 NOTE — Progress Notes (Signed)
Pulmonary Medicine     SUBJECTIVE   Continues to cough but is weaning on O2.  States he is close to baseline, he is currently on 3L and at home uses 2.5L bleed in with CPAP.     PAST MEDICAL HISTORY   Past Medical History:  Diagnosis Date  . Antiphospholipid antibody syndrome (HCC)   . CAD S/P percutaneous coronary angioplasty '98, '04, Feb 2017   [facility]  . Carotid arterial disease (HCC) 10/2015   moderate bilateral  . COPD (chronic obstructive pulmonary disease) (HCC)   . Coronary artery disease   . Hypertension   . Non-insulin treated type 2 diabetes mellitus (HCC)   . Pulmonary emboli (HCC) 2011  . RBBB   . Sleep apnea      SURGICAL HISTORY   Past Surgical History:  Procedure Laterality Date  . APPENDECTOMY    . APPLICATION OF WOUND VAC Left 03/18/2019   Procedure: Application Of Wound Vac;  Surgeon: Nadara Mustarduda, Marcus V, MD;  [facility];  Service: Orthopedics;  Laterality: Left;  . BACK SURGERY  '89, '06  . COLONOSCOPY    . CORONARY ANGIOPLASTY WITH STENT PLACEMENT  '98, '04, Feb 2017  . I & D EXTREMITY Left 03/15/2019   Procedure: DEBRIDEMENT LEFT THIGH;  Surgeon: Nadara Mustarduda, Marcus V, MD;  [facility];  Service: Orthopedics;  Laterality: Left;  . INCISION AND DRAINAGE OF WOUND Left 03/18/2019   Procedure: LEFT THIGH DEBRIDEMENT;  Surgeon: Nadara Mustarduda, Marcus V, MD;  [facility];  Service: Orthopedics;  Laterality: Left;     FAMILY HISTORY   Family History  Problem Relation Age of Onset  . CAD Father 3872     SOCIAL HISTORY   Social History   Tobacco Use  . Smoking status: Current Every Day Smoker    Packs/day: 0.50    Years: 40.00    Pack years: 20.00    Types: Cigarettes    Last attempt to quit: 01/15/2019    Years since quitting: 1.5  . Smokeless tobacco: Never Used  . Tobacco comment: has started smoking again, willing to quit.  Vaping Use  . Vaping Use: Never used  Substance Use Topics  . Alcohol use: No  . Drug use: No     MEDICATIONS     Home Medication:    Current Medication:  Current Facility-Administered Medications:  .  0.9 %  sodium chloride infusion, 250 mL, Intravenous, PRN, Doutova, Anastassia, MD, Last Rate: 10 mL/hr at 07/16/20 0455, 250 mL at 07/16/20 0455 .  acetaminophen (TYLENOL) tablet 650 mg, 650 mg, Oral, Q6H PRN **OR** acetaminophen (TYLENOL) suppository 650 mg, 650 mg, Rectal, Q6H PRN, Doutova, Anastassia, MD .  albuterol (PROVENTIL) (2.5 MG/3ML) 0.083% nebulizer solution 2.5 mg, 2.5 mg, Nebulization, Q2H PRN, Doutova, Anastassia, MD, 2.5 mg at 07/16/20 1627 .  albuterol (VENTOLIN HFA) 108 (90 Base) MCG/ACT inhaler 2 puff, 2 puff, Inhalation, Q6H PRN, Lurene ShadowAyiku, Bernard, MD .  aspirin EC tablet 81 mg, 81 mg, Oral, Daily, Doutova, Anastassia, MD, 81 mg at 07/18/20 0847 .  benzonatate (TESSALON) capsule 100 mg, 100 mg, Oral, TID, Rai, Ripudeep K, MD, 100 mg at 07/18/20 2118 .  doxycycline (VIBRA-TABS) tablet 100 mg, 100 mg, Oral, Q12H, Rai, Ripudeep K, MD .  DULoxetine (CYMBALTA) DR capsule 60 mg, 60 mg, Oral, Daily, Lurene ShadowAyiku, Bernard, MD, 60 mg at 07/18/20 0848 .  fluticasone furoate-vilanterol (BREO ELLIPTA) 200-25 MCG/INH 1 puff, 1 puff, Inhalation, Daily, 1 puff at 07/18/20 1043 **AND** umeclidinium  bromide (INCRUSE ELLIPTA) 62.5 MCG/INH 1 puff, 1 puff, Inhalation, Daily, Lurene Shadow, MD, 1 puff at 07/18/20 1043 .  gabapentin (NEURONTIN) tablet 600 mg, 600 mg, Oral, TID, Lurene Shadow, MD, 600 mg at 07/18/20 2118 .  guaiFENesin-codeine 100-10 MG/5ML solution 5 mL, 5 mL, Oral, Q6H PRN, Rai, Ripudeep K, MD .  HYDROcodone-acetaminophen (NORCO/VICODIN) 5-325 MG per tablet 1-2 tablet, 1-2 tablet, Oral, Q4H PRN, Doutova, Anastassia, MD .  insulin aspart (novoLOG) injection 0-15 Units, 0-15 Units, Subcutaneous, TID WC, Rai, Ripudeep K, MD, 8 Units at 07/18/20 1647 .  insulin aspart (novoLOG) injection 0-5 Units, 0-5 Units, Subcutaneous, QHS, Rai, Ripudeep K, MD, 2 Units at 07/17/20 2235 .  insulin aspart (novoLOG)  injection 4 Units, 4 Units, Subcutaneous, TID WC, Rai, Ripudeep K, MD, 4 Units at 07/18/20 1648 .  ipratropium-albuterol (DUONEB) 0.5-2.5 (3) MG/3ML nebulizer solution 3 mL, 3 mL, Nebulization, BID, Lurene Shadow, MD, 3 mL at 07/19/20 0730 .  metoprolol tartrate (LOPRESSOR) tablet 12.5 mg, 12.5 mg, Oral, BID, Lurene Shadow, MD, 12.5 mg at 07/18/20 2119 .  nicotine (NICODERM CQ - dosed in mg/24 hours) patch 21 mg, 21 mg, Transdermal, Daily, Doutova, Anastassia, MD, 21 mg at 07/18/20 0853 .  pantoprazole (PROTONIX) EC tablet 40 mg, 40 mg, Oral, BID, Lurene Shadow, MD, 40 mg at 07/18/20 2119 .  predniSONE (DELTASONE) tablet 40 mg, 40 mg, Oral, Q breakfast, Rai, Ripudeep K, MD .  rosuvastatin (CRESTOR) tablet 10 mg, 10 mg, Oral, QPM, Doutova, Anastassia, MD, 10 mg at 07/18/20 1647 .  sodium chloride flush (NS) 0.9 % injection 3 mL, 3 mL, Intravenous, Q12H, Doutova, Anastassia, MD, 3 mL at 07/18/20 2119 .  sodium chloride flush (NS) 0.9 % injection 3 mL, 3 mL, Intravenous, PRN, Doutova, Anastassia, MD .  tamsulosin (FLOMAX) capsule 0.8 mg, 0.8 mg, Oral, Daily, Lurene Shadow, MD, 0.8 mg at 07/18/20 0848 .  tiZANidine (ZANAFLEX) tablet 4 mg, 4 mg, Oral, BID PRN, Lurene Shadow, MD, 4 mg at 07/15/20 2120 .  topiramate (TOPAMAX) tablet 50 mg, 50 mg, Oral, BID, Doutova, Anastassia, MD, 50 mg at 07/18/20 2119 .  torsemide (DEMADEX) tablet 20 mg, 20 mg, Oral, Daily, Doutova, Anastassia, MD, 20 mg at 07/18/20 0847 .  Warfarin - Pharmacist Dosing Inpatient, , Does not apply, q1600, Foye Deer, [facility], Given at 07/18/20 1648    ALLERGIES   Melatonin and Ramipril     REVIEW OF SYSTEMS    Review of Systems:  Gen:  Denies  fever, sweats, chills weigh loss  HEENT: Denies blurred vision, double vision, ear pain, eye pain, hearing loss, nose bleeds, sore throat Cardiac:  No dizziness, chest pain or heaviness, chest tightness,edema Resp:   + cough or sputum porduction, + chronic shortness of breath,+  wheezing, -hemoptysis,  Gi: Denies swallowing difficulty, stomach pain, nausea or vomiting, diarrhea, constipation, bowel incontinence Gu:  Denies bladder incontinence, burning urine Ext:   Denies Joint pain, stiffness or swelling Skin: Denies  skin rash, easy bruising or bleeding or hives Endoc:  Denies polyuria, polydipsia , polyphagia or weight change Psych:   Denies depression, insomnia or hallucinations   Other:  All other systems negative   VS: BP 128/65 (BP Location: Left Arm)   Pulse 68   Temp 97.7 F (36.5 C)   Resp 18   Ht 5\' 7"  (1.702 m)   Wt 108.8 kg   SpO2 100%   BMI 37.57 kg/m      PHYSICAL EXAM    GENERAL:NAD, no fevers,  chills, sitting in chair having breakfast, speaking in full sentences HEAD: Normocephalic, atraumatic.  EYES: Pupils equal, round, reactive to light. Extraocular muscles intact. No scleral icterus.  MOUTH: Moist mucosal membrane. Dentition intact. No abscess noted.  EAR, NOSE, THROAT: Clear without exudates. No external lesions.  NECK: Supple. No thyromegaly. No nodules. No JVD, short, thick neck.  PULMONARY: Diffuse +wheezes CARDIOVASCULAR: S1 and S2. Regular rate and rhythm. No murmurs, rubs, or gallops. No edema. Pedal pulses 2+ bilaterally.  GASTROINTESTINAL: Soft, nontender, nondistended. No masses. Positive bowel sounds. No hepatosplenomegaly.  MUSCULOSKELETAL: No swelling, clubbing,+ trace  edema. Range of motion full in all extremities.  NEUROLOGIC: Cranial nerves II through XII are intact. No gross focal neurological deficits. Sensation intact. Reflexes intact.  SKIN: No ulceration, lesions, rashes, or cyanosis. Skin warm and dry. Turgor intact.  PSYCHIATRIC: Mood, affect within normal limits. The patient is awake, alert and oriented x 3. Insight, judgment intact.       IMAGING    CT Angio Chest PE W and/or Wo Contrast  Result Date: 07/13/2020 CLINICAL DATA:  Increasing shortness of breath for 10 days, COPD EXAM: CT ANGIOGRAPHY  CHEST WITH CONTRAST TECHNIQUE: Multidetector CT imaging of the chest was performed using the standard protocol during bolus administration of intravenous contrast. Multiplanar CT image reconstructions and MIPs were obtained to evaluate the vascular anatomy. CONTRAST:  31mL OMNIPAQUE IOHEXOL 350 MG/ML SOLN COMPARISON:  07/13/2020, 12/27/2018 FINDINGS: Cardiovascular: This is a technically adequate evaluation of the pulmonary vasculature. There are no filling defects or pulmonary emboli. The heart is unremarkable without pericardial effusion. No evidence of thoracic aortic aneurysm or dissection. Postsurgical changes are seen from median sternotomy and coronary artery bypass. Mild atherosclerosis of the aortic arch. Extensive atherosclerosis of the native coronary vessels. Moderate calcification of the aortic valve. Mediastinum/Nodes: No pathologically enlarged mediastinal, hilar, or axillary adenopathy. Thyroid gland, trachea, and esophagus demonstrate no significant findings. Lungs/Pleura: No acute airspace disease, effusion, or pneumothorax. Minimal scarring within the right lower lobe. Central airways are patent. Upper Abdomen: No acute abnormality. Musculoskeletal: No acute or destructive bony lesions. Reconstructed images demonstrate no additional findings. Review of the MIP images confirms the above findings. IMPRESSION: 1. No evidence of pulmonary embolus. 2. No acute intrathoracic process. 3.  Aortic Atherosclerosis (ICD10-I70.0). Electronically Signed   By: Sharlet Salina M.D.   On: 07/13/2020 19:25   DG Chest Portable 1 View  Result Date: 07/13/2020 CLINICAL DATA:  Shortness of breath and cough. EXAM: PORTABLE CHEST 1 VIEW COMPARISON:  01/18/2020 FINDINGS: Cardiomegaly and CABG changes again noted. Mild pulmonary vascular congestion is present. There is no evidence of focal airspace disease, pulmonary edema, suspicious pulmonary nodule/mass, pleural effusion, or pneumothorax. No acute bony abnormalities  are identified. IMPRESSION: Cardiomegaly with mild pulmonary vascular congestion. Electronically Signed   By: Harmon Pier M.D.   On: 07/13/2020 17:08   FVC Pre L 1.53   FEV1 Pre L 0.99   FEV1/FVC Pre % 64.66   FEF25-75% Pre L/s 0.55   PEF Pre L/s 2.54   FVC_LLN  2.92   FVC_Z-SCORE  -4.08   FVC_%PRED % 39 %   FVC_Z-SCORE  -4.08   FEV1_LLN  2.22   FEV1_Z-SCORE  -3.92   FEV1_%PRED % 33 %   FEV1_Z-SCORE  -3.92   FEV1/FVC_LLN  65   FEF25-75%_LLN  1.14   FEF25-75%_Z-SCORE  -2.72   FEF25-75%_%PRED % 22 %   FEF25-75%_Z-SCORE  -2.72   PEF_LLN  5.99   PEF_%PRED % 32 %  Resulting Agency  [id number]  Specimen Collected: 03/04/20 9:57 AM Last Resulted: 03/05/20 9:20 AM  Received From: [facility]  Result Received: 04/      ASSESSMENT/PLAN   Copd exacerbation -moderate acute severity      - ( hx of severe copd) , mild pulm edema, obesity, refractory bronchospasm, slowly improving. He will always be short of breath as discussed with him. Fev1 0.99 liters.  Continue as is ( steroids, antibiotics, b2 agonist and anticholinergics) Flutter valve Mucomyst if unable to fully mobilize sputum Physical therapy Continue diuresis as current  OSA While here  Put back on his bipap settings ( help his osa and breathing) He goes back to [facility] on d/c for switching his bipap to auto cpap  Weight loss     Thank you for allowing me to participate in the care of this patient.   Patient/Family are satisfied with care plan and all questions have been answered.  This document was prepared using Dragon voice recognition software and may include unintentional dictation errors.    Vida Rigger, M.D.  Pulmonary & Critical Care Medicine  [facility]

## 2020-07-19 NOTE — Progress Notes (Signed)
Inpatient Diabetes Program Recommendations  AACE/ADA: New Consensus Statement on Inpatient Glycemic Control   Target Ranges:  Prepandial:   less than 140 mg/dL      Peak postprandial:   less than 180 mg/dL (1-2 hours)      Critically ill patients:  140 - 180 mg/dL   Results for Jose [name], Jose [name] (MRN [id number]) as of 07/19/2020 08:57  Ref. Range 07/18/2020 08:17 07/18/2020 11:33 07/18/2020 15:39 07/18/2020 21:15 07/19/2020 08:19  Glucose-Capillary Latest Ref Range: 70 - 99 mg/dL 409 (H) 811 (H) 914 (H) 199 (H) 119 (H)  Results for Jose [name], Jose [name] (MRN [id number]) as of 07/19/2020 08:57  Ref. Range 07/14/2020 05:00 07/17/2020 04:18  Hemoglobin A1C Latest Ref Range: 4.8 - 5.6 % 7.7 (H) 8.0 (H)   Review of Glycemic Control  Diabetes history: DM2 Outpatient Diabetes medications: Ozempic (not taking due to cost) Current orders for Inpatient glycemic control: Novolog 0-15 units TID with meals, Novolog 4 units TID with meals; Prednisone 40 mg daily  Inpatient Diabetes Program Recommendations:    Insulin: If steroids are continued as ordered, please consider increasing meal coverage to Novolog 6 units TID with meals.  HbgA1C: A1C 7.7% on 07/14/20 and patient is not taking an outpatient DM meds. Please consider discharging on affordable DM medication (such as Metformin which is on $4 medication list at [facility]).  Thanks, Orlando Penner, RN, MSN, CDE Diabetes Coordinator Inpatient Diabetes Program [phone] (Team Pager from 8am to 5pm)

## 2020-07-19 NOTE — Plan of Care (Signed)

## 2020-07-20 LAB — PROTIME-INR
INR: 2.1 — ABNORMAL HIGH (ref 0.8–1.2)
Prothrombin Time: 23.9 seconds — ABNORMAL HIGH (ref 11.4–15.2)

## 2020-07-20 NOTE — Plan of Care (Signed)
Patient seen and examined. Could not dc home yesterday due to weather and transportation issues. Doing well, no complaints. Wife on the way to pick him up.    Thad Ranger M.D.  [facility] 07/20/2020, 7:40 AM

## 2020-07-20 NOTE — Progress Notes (Signed)
Went through discharge with the pt, pt left POV with his wife

## 2020-07-22 NOTE — Progress Notes (Signed)
Call from RN Robyn 5/8 evening. Jose [name] reported patient was discharged this week and called to the unit saying Jose [name] never delivered his home o2 concentrator.   CSW called Jose [name] on call line, per Representative they could not deliver due to insurance issues and had notified the patient.  CSW called patient with update. Patient says he has fixed the insurance issues. Patient said he has his old concentrator from [employer] that they have not picked up yet. Provided patient with on call number to call Jose [name]. He says he will follow up with them now.  Patient denied additional needs.  Jose [name], [place] [phone]

## 2020-10-07 ENCOUNTER — Other Ambulatory Visit: Payer: Self-pay | Admitting: Internal Medicine

## 2020-10-07 DIAGNOSIS — G44319 Acute post-traumatic headache, not intractable: Secondary | ICD-10-CM

## 2020-10-07 DIAGNOSIS — Z7901 Long term (current) use of anticoagulants: Secondary | ICD-10-CM

## 2020-10-08 ENCOUNTER — Ambulatory Visit
Admission: RE | Admit: 2020-10-08 | Discharge: 2020-10-08 | Disposition: A | Discharge status: Home / Self Care | Payer: Medicare Other | Source: Ambulatory Visit | Attending: Internal Medicine | Admitting: Internal Medicine

## 2020-10-08 ENCOUNTER — Other Ambulatory Visit: Payer: Self-pay

## 2020-10-08 DIAGNOSIS — Z7901 Long term (current) use of anticoagulants: Secondary | ICD-10-CM | POA: Diagnosis present

## 2020-10-08 DIAGNOSIS — G44319 Acute post-traumatic headache, not intractable: Secondary | ICD-10-CM | POA: Diagnosis present

## 2021-01-22 ENCOUNTER — Other Ambulatory Visit: Payer: Self-pay | Admitting: Neurology

## 2021-01-22 ENCOUNTER — Other Ambulatory Visit (HOSPITAL_COMMUNITY): Payer: Self-pay | Admitting: Neurology

## 2021-01-22 DIAGNOSIS — R569 Unspecified convulsions: Secondary | ICD-10-CM

## 2021-01-28 ENCOUNTER — Other Ambulatory Visit: Payer: Self-pay

## 2021-01-28 ENCOUNTER — Ambulatory Visit (HOSPITAL_COMMUNITY)
Admission: RE | Admit: 2021-01-28 | Discharge: 2021-01-28 | Disposition: A | Discharge status: Home / Self Care | Payer: Medicare Other | Source: Ambulatory Visit | Attending: Neurology | Admitting: Neurology

## 2021-01-28 DIAGNOSIS — R569 Unspecified convulsions: Secondary | ICD-10-CM | POA: Diagnosis present

## 2021-03-04 ENCOUNTER — Other Ambulatory Visit: Payer: Self-pay

## 2021-03-04 ENCOUNTER — Emergency Department: Payer: Medicare Other

## 2021-03-04 ENCOUNTER — Emergency Department (HOSPITAL_COMMUNITY): Payer: Medicare Other

## 2021-03-04 ENCOUNTER — Encounter: Payer: Self-pay | Admitting: Emergency Medicine

## 2021-03-04 ENCOUNTER — Inpatient Hospital Stay
Admission: EM | Admit: 2021-03-04 | Discharge: 2021-03-07 | DRG: 190 | Disposition: A | Discharge status: Home / Self Care | Payer: Medicare Other | Attending: Internal Medicine | Admitting: Internal Medicine

## 2021-03-04 DIAGNOSIS — Z6838 Body mass index (BMI) 38.0-38.9, adult: Secondary | ICD-10-CM

## 2021-03-04 DIAGNOSIS — Z9861 Coronary angioplasty status: Secondary | ICD-10-CM

## 2021-03-04 DIAGNOSIS — I6529 Occlusion and stenosis of unspecified carotid artery: Secondary | ICD-10-CM | POA: Diagnosis present

## 2021-03-04 DIAGNOSIS — I4510 Unspecified right bundle-branch block: Secondary | ICD-10-CM | POA: Diagnosis present

## 2021-03-04 DIAGNOSIS — R760 Raised antibody titer: Secondary | ICD-10-CM | POA: Diagnosis not present

## 2021-03-04 DIAGNOSIS — Z8249 Family history of ischemic heart disease and other diseases of the circulatory system: Secondary | ICD-10-CM

## 2021-03-04 DIAGNOSIS — G4733 Obstructive sleep apnea (adult) (pediatric): Secondary | ICD-10-CM | POA: Diagnosis present

## 2021-03-04 DIAGNOSIS — N400 Enlarged prostate without lower urinary tract symptoms: Secondary | ICD-10-CM | POA: Diagnosis present

## 2021-03-04 DIAGNOSIS — F17210 Nicotine dependence, cigarettes, uncomplicated: Secondary | ICD-10-CM | POA: Diagnosis present

## 2021-03-04 DIAGNOSIS — Z7951 Long term (current) use of inhaled steroids: Secondary | ICD-10-CM

## 2021-03-04 DIAGNOSIS — Z7901 Long term (current) use of anticoagulants: Secondary | ICD-10-CM

## 2021-03-04 DIAGNOSIS — N3281 Overactive bladder: Secondary | ICD-10-CM | POA: Diagnosis present

## 2021-03-04 DIAGNOSIS — K219 Gastro-esophageal reflux disease without esophagitis: Secondary | ICD-10-CM | POA: Diagnosis present

## 2021-03-04 DIAGNOSIS — Z86711 Personal history of pulmonary embolism: Secondary | ICD-10-CM

## 2021-03-04 DIAGNOSIS — E1142 Type 2 diabetes mellitus with diabetic polyneuropathy: Secondary | ICD-10-CM | POA: Diagnosis present

## 2021-03-04 DIAGNOSIS — I2510 Atherosclerotic heart disease of native coronary artery without angina pectoris: Secondary | ICD-10-CM | POA: Diagnosis present

## 2021-03-04 DIAGNOSIS — E785 Hyperlipidemia, unspecified: Secondary | ICD-10-CM | POA: Diagnosis present

## 2021-03-04 DIAGNOSIS — Z888 Allergy status to other drugs, medicaments and biological substances status: Secondary | ICD-10-CM

## 2021-03-04 DIAGNOSIS — E669 Obesity, unspecified: Secondary | ICD-10-CM | POA: Diagnosis present

## 2021-03-04 DIAGNOSIS — I10 Essential (primary) hypertension: Secondary | ICD-10-CM | POA: Diagnosis present

## 2021-03-04 DIAGNOSIS — J441 Chronic obstructive pulmonary disease with (acute) exacerbation: Secondary | ICD-10-CM | POA: Diagnosis present

## 2021-03-04 DIAGNOSIS — R0602 Shortness of breath: Secondary | ICD-10-CM | POA: Diagnosis present

## 2021-03-04 DIAGNOSIS — E1169 Type 2 diabetes mellitus with other specified complication: Secondary | ICD-10-CM | POA: Diagnosis present

## 2021-03-04 DIAGNOSIS — Z66 Do not resuscitate: Secondary | ICD-10-CM | POA: Diagnosis present

## 2021-03-04 DIAGNOSIS — R600 Localized edema: Secondary | ICD-10-CM | POA: Diagnosis present

## 2021-03-04 DIAGNOSIS — D6861 Antiphospholipid syndrome: Secondary | ICD-10-CM

## 2021-03-04 DIAGNOSIS — J9621 Acute and chronic respiratory failure with hypoxia: Secondary | ICD-10-CM | POA: Diagnosis present

## 2021-03-04 DIAGNOSIS — Z20822 Contact with and (suspected) exposure to covid-19: Secondary | ICD-10-CM | POA: Diagnosis present

## 2021-03-04 DIAGNOSIS — Z7982 Long term (current) use of aspirin: Secondary | ICD-10-CM

## 2021-03-04 DIAGNOSIS — Z7984 Long term (current) use of oral hypoglycemic drugs: Secondary | ICD-10-CM

## 2021-03-04 DIAGNOSIS — Z79899 Other long term (current) drug therapy: Secondary | ICD-10-CM

## 2021-03-04 LAB — CBC WITH DIFFERENTIAL/PLATELET
Abs Immature Granulocytes: 0.07 10*3/uL (ref 0.00–0.07)
Basophils Absolute: 0 10*3/uL (ref 0.0–0.1)
Basophils Relative: 0 %
Eosinophils Absolute: 0.1 10*3/uL (ref 0.0–0.5)
Eosinophils Relative: 1 %
HCT: 40.6 % (ref 39.0–52.0)
Hemoglobin: 12.7 g/dL — ABNORMAL LOW (ref 13.0–17.0)
Immature Granulocytes: 1 %
Lymphocytes Relative: 45 %
Lymphs Abs: 4.7 10*3/uL — ABNORMAL HIGH (ref 0.7–4.0)
MCH: 28.7 pg (ref 26.0–34.0)
MCHC: 31.3 g/dL (ref 30.0–36.0)
MCV: 91.9 fL (ref 80.0–100.0)
Monocytes Absolute: 1.4 10*3/uL — ABNORMAL HIGH (ref 0.1–1.0)
Monocytes Relative: 13 %
Neutro Abs: 4.1 10*3/uL (ref 1.7–7.7)
Neutrophils Relative %: 40 %
Platelets: 212 10*3/uL (ref 150–400)
RBC: 4.42 MIL/uL (ref 4.22–5.81)
RDW: 15.1 % (ref 11.5–15.5)
WBC: 10.3 10*3/uL (ref 4.0–10.5)
nRBC: 0 % (ref 0.0–0.2)

## 2021-03-04 LAB — COMPREHENSIVE METABOLIC PANEL
ALT: 23 U/L (ref 0–44)
AST: 19 U/L (ref 15–41)
Albumin: 3.6 g/dL (ref 3.5–5.0)
Alkaline Phosphatase: 69 U/L (ref 38–126)
Anion gap: 6 (ref 5–15)
BUN: 18 mg/dL (ref 8–23)
CO2: 34 mmol/L — ABNORMAL HIGH (ref 22–32)
Calcium: 8.7 mg/dL — ABNORMAL LOW (ref 8.9–10.3)
Chloride: 96 mmol/L — ABNORMAL LOW (ref 98–111)
Creatinine, Ser: 0.81 mg/dL (ref 0.61–1.24)
GFR, Estimated: >60 mL/min (ref >60)
Glucose, Bld: 120 mg/dL — ABNORMAL HIGH (ref 70–99)
Potassium: 3.6 mmol/L (ref 3.5–5.1)
Sodium: 136 mmol/L (ref 135–145)
Total Bilirubin: 0.5 mg/dL (ref 0.3–1.2)
Total Protein: 7.5 g/dL (ref 6.5–8.1)

## 2021-03-04 LAB — RESP PANEL BY RT-PCR (FLU A&B, COVID) ARPGX2
Influenza A by PCR: NEGATIVE
Influenza B by PCR: NEGATIVE
SARS Coronavirus 2 by RT PCR: NEGATIVE

## 2021-03-04 LAB — PROCALCITONIN: Procalcitonin: 0.11 ng/mL

## 2021-03-04 LAB — TROPONIN I (HIGH SENSITIVITY): Troponin I (High Sensitivity): 9 ng/L (ref <18)

## 2021-03-04 LAB — PROTIME-INR
INR: 2.8 — ABNORMAL HIGH (ref 0.8–1.2)
Prothrombin Time: 29.5 seconds — ABNORMAL HIGH (ref 11.4–15.2)

## 2021-03-04 MED ORDER — IPRATROPIUM-ALBUTEROL 0.5-2.5 (3) MG/3ML IN SOLN
9.0000 mL | Freq: Once | RESPIRATORY_TRACT | Status: AC
  Filled 2021-03-04: qty 3

## 2021-03-04 MED ORDER — IPRATROPIUM-ALBUTEROL 0.5-2.5 (3) MG/3ML IN SOLN
RESPIRATORY_TRACT | Status: AC
  Administered 2021-03-04: 21:00:00 9 mL via RESPIRATORY_TRACT
  Filled 2021-03-04: qty 6

## 2021-03-04 NOTE — ED Provider Notes (Signed)
[facility] Emergency Department Provider Note  ____________________________________________   Event Date/Time   First MD Initiated Contact with Patient 03/04/21 1947     (approximate)  I have reviewed the triage vital signs and the nursing notes.   HISTORY  Chief Complaint Shortness of Breath   HPI Jose [name] is a 65 y.o. male with a past medical history of antiphospholipid syndrome and PEs on Coumadin, COPD and chronic hypoxic respiratory failure on 3 L at baseline, CAD, HTN, OSA and known right bundle branch block and remote tobacco abuse who presents for assessment approximately 1 week of worsening shortness of breath and cough.  Patient denies any new chest pain, abdominal pain, back pain, vomiting or diarrhea but states he did have fever yesterday.  No new headache, earache, sore throat, rash or recent falls or injuries.  States he has been using breathing treatments at home does not feel is helped much.  He did receive 125 mg of Solu-Medrol and 2 DuoNeb's with EMS prior to arrival.  States he is compliant with all his medications including his Coumadin.         Past Medical History:  Diagnosis Date   Antiphospholipid antibody syndrome (HCC)    CAD S/P percutaneous coronary angioplasty '98, '04, Feb 2017   [facility]   Carotid arterial disease (HCC) 10/2015   moderate bilateral   COPD (chronic obstructive pulmonary disease) (HCC)    Coronary artery disease    Hypertension    Non-insulin treated type 2 diabetes mellitus (HCC)    Pulmonary emboli (HCC) 2011   RBBB    Sleep apnea     Patient Active Problem List   Diagnosis Date Noted   COPD exacerbation (HCC) 07/13/2020   Prolonged QT interval 07/13/2020   Acute on chronic respiratory failure with hypoxia (HCC) 07/13/2020   Chronic diastolic CHF (congestive heart failure) (HCC) 07/13/2020   Community acquired pneumonia 01/18/2020   Coronary atherosclerosis 10/04/2017   GERD (gastroesophageal  reflux disease) 10/04/2017   Hypertension 10/04/2017   Transient visual loss 10/04/2017   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   Nonrheumatic aortic valve stenosis 03/31/2017   Pericarditis, Acute 02/12/2017   Acute on chronic right heart failure (HCC) 02/11/2017   Angina at rest [facility]) 08/17/2016   Unstable angina (HCC) 08/16/2016   Tobacco abuse 08/16/2016   Anti-phospholipid syndrome (HCC) 08/16/2016   Hyperlipidemia 08/16/2016   Essential hypertension, benign 08/16/2016   COPD (chronic obstructive pulmonary disease) (HCC) 08/16/2016   Sleep apnea 08/16/2016   CAD S/P multiple PCIs 08/16/2016   History of pulmonary embolus (PE)-2011 08/16/2016   Chronic anticoagulation-Couamdin 08/16/2016   Non-insulin treated type 2 diabetes mellitus (HCC) 08/16/2016   Carotid artery disease (HCC) 08/16/2016   RBBB (right bundle branch block) 08/16/2016   Dyspnea on exertion 10/17/2015   ED (erectile dysfunction) 08/09/2012    Past Surgical History:  Procedure Laterality Date   APPENDECTOMY     APPLICATION OF WOUND VAC Left 03/18/2019   Procedure: Application Of Wound Vac;  Surgeon: Nadara Mustard, MD;  Location: [facility] OR;  Service: Orthopedics;  Laterality: Left;   BACK SURGERY  '89, '06   COLONOSCOPY     CORONARY ANGIOPLASTY WITH STENT PLACEMENT  '98, '04, Feb 2017   I & D EXTREMITY Left 03/15/2019   Procedure: DEBRIDEMENT LEFT THIGH;  Surgeon: Nadara Mustard, MD;  Location: [facility] OR;  Service: Orthopedics;  Laterality: Left;   INCISION AND DRAINAGE OF WOUND Left 03/18/2019   Procedure: LEFT  THIGH DEBRIDEMENT;  Surgeon: Nadara Mustard, MD;  Location: [facility] OR;  Service: Orthopedics;  Laterality: Left;    Prior to Admission medications   Medication Sig Start Date End Date Taking? Authorizing Provider  albuterol (VENTOLIN HFA) 108 (90 Base) MCG/ACT inhaler Inhale 2 puffs into the lungs every 6 (six) hours as needed for wheezing or shortness of breath.   Yes [provider]  aspirin EC 81 MG EC tablet Take  1 tablet (81 mg total) by mouth daily. Swallow whole. Patient taking differently: Take 81 mg by mouth at bedtime. 01/23/20  Yes Dagar, Geralynn Rile, MD  DULoxetine (CYMBALTA) 60 MG capsule Take 60 mg by mouth daily.   Yes [provider]  Fluticasone-Umeclidin-Vilant 200-62.5-25 MCG/INH AEPB Inhale 1 puff into the lungs daily.   Yes [provider]  gabapentin (NEURONTIN) 600 MG tablet Take 600 mg by mouth 3 (three) times daily.   Yes [provider]  glimepiride (AMARYL) 4 MG tablet Take 4 mg by mouth 2 (two) times daily. 12/04/20  Yes [provider]  metoprolol tartrate (LOPRESSOR) 12.5 mg TABS tablet Take 12.5 mg by mouth 2 (two) times daily.   Yes [provider]  pantoprazole (PROTONIX) 40 MG tablet Take 40 mg by mouth 2 (two) times daily.   Yes [provider]  rosuvastatin (CRESTOR) 10 MG tablet Take 10 mg by mouth every evening. 07/07/16  Yes [provider]  tamsulosin (FLOMAX) 0.4 MG CAPS capsule Take 0.8 mg by mouth daily.   Yes [provider]  topiramate (TOPAMAX) 50 MG tablet Take 75 mg by mouth 2 (two) times daily.   Yes [provider]  torsemide (DEMADEX) 20 MG tablet Take 20 mg by mouth daily.   Yes [provider]  Vitamin D, Ergocalciferol, (DRISDOL) 1.25 MG (50000 UNIT) CAPS capsule Take 50,000 Units by mouth once a week. 02/04/21  Yes [provider]  warfarin (COUMADIN) 6 MG tablet Take 6 mg by mouth daily at 4 PM.   Yes [provider]  albuterol (PROVENTIL) (5 MG/ML) 0.5% nebulizer solution Take 0.5 mLs (2.5 mg total) by nebulization every 6 (six) hours as needed for wheezing or shortness of breath. 12/27/18   Terald Sleeper, MD  alfuzosin (UROXATRAL) 10 MG 24 hr tablet Take 10 mg by mouth daily. Patient not taking: Reported on 03/04/2021 12/04/20   [provider]  nitroGLYCERIN (NITROSTAT) 0.4 MG SL tablet Place 0.4 mg under the tongue every 5 (five) minutes as  needed for chest pain.  07/07/16   [provider]  oxybutynin (DITROPAN) 5 MG tablet Take 5 mg by mouth 3 (three) times daily as needed. Patient not taking: Reported on 03/04/2021 01/23/21   [provider]  OZEMPIC, 1 MG/DOSE, 4 MG/3ML SOPN Inject 1 mg into the skin every Monday. Patient not taking: Reported on 03/04/2021    [provider]  warfarin (COUMADIN) 1 MG tablet Take 1 mg by mouth See admin instructions. Take 1 tablet (1mg ) by mouth with 6mg  tablet (total 7mg ) every Monday and Friday evening Patient not taking: Reported on 03/04/2021    [provider]    Allergies Melatonin and Ramipril  Family History  Problem Relation Age of Onset   CAD Father 39    Social History Social History   Tobacco Use   Smoking status: Every Day    Packs/day: 0.50    Years: 40.00    Pack years: 20.00    Types: Cigarettes  Last attempt to quit: 01/15/2019    Years since quitting: 2.1   Smokeless tobacco: Never   Tobacco comments:    has started smoking again, willing to quit.  Vaping Use   Vaping Use: Never used  Substance Use Topics   Alcohol use: No   Drug use: No    Review of Systems  Review of Systems  Constitutional:  Negative for chills and fever.  HENT:  Negative for sore throat.   Eyes:  Negative for pain.  Respiratory:  Positive for cough, sputum production, shortness of breath and wheezing. Negative for stridor.   Cardiovascular:  Negative for chest pain.  Gastrointestinal:  Negative for vomiting.  Genitourinary:  Negative for dysuria.  Musculoskeletal:  Negative for myalgias.  Skin:  Negative for rash.  Neurological:  Negative for seizures, loss of consciousness and headaches.  Psychiatric/Behavioral:  Negative for suicidal ideas.   All other systems reviewed and are negative.    ____________________________________________   PHYSICAL EXAM:  VITAL SIGNS: ED Triage Vitals  Enc Vitals Group     BP 03/04/21 1942 (!)  144/80     Pulse Rate 03/04/21 1942 87     Resp 03/04/21 1943 19     Temp --      Temp src --      SpO2 03/04/21 1942 100 %     Weight 03/04/21 1944 245 lb (111.1 kg)     Height 03/04/21 1944 5\' 7"  (1.702 m)     Head Circumference --      Peak Flow --      Pain Score --      Pain Loc --      Pain Edu? --      Excl. in GC? --    Vitals:   03/04/21 2130 03/04/21 2146  BP: (!) 147/68   Pulse: 95   Resp: (!) 27   Temp:    SpO2: 96% 93%   Physical Exam Vitals and nursing note reviewed.  Constitutional:      General: He is not in acute distress.    Appearance: He is well-developed.  HENT:     Head: Normocephalic and atraumatic.     Right Ear: External ear normal.     Left Ear: External ear normal.     Nose: Nose normal.  Eyes:     Conjunctiva/sclera: Conjunctivae normal.  Cardiovascular:     Rate and Rhythm: Normal rate and regular rhythm.     Heart sounds: No murmur heard. Pulmonary:     Effort: Tachypnea, accessory muscle usage and respiratory distress present.     Breath sounds: Decreased breath sounds and wheezing present.  Abdominal:     Palpations: Abdomen is soft.     Tenderness: There is no abdominal tenderness.  Musculoskeletal:        General: No swelling.     Cervical back: Neck supple.  Skin:    General: Skin is warm and dry.     Capillary Refill: Capillary refill takes less than 2 seconds.  Neurological:     Mental Status: He is alert.  Psychiatric:        Mood and Affect: Mood normal.     ____________________________________________   LABS (all labs ordered are listed, but only abnormal results are displayed)  Labs Reviewed  PROTIME-INR - Abnormal; Notable for the following components:      Result Value   Prothrombin Time 29.5 (*)    INR 2.8 (*)    All other components  within normal limits  CBC WITH DIFFERENTIAL/PLATELET - Abnormal; Notable for the following components:   Hemoglobin 12.7 (*)    Lymphs Abs 4.7 (*)    Monocytes Absolute 1.4  (*)    All other components within normal limits  COMPREHENSIVE METABOLIC PANEL - Abnormal; Notable for the following components:   Chloride 96 (*)    CO2 34 (*)    Glucose, Bld 120 (*)    Calcium 8.7 (*)    All other components within normal limits  RESP PANEL BY RT-PCR (FLU A&B, COVID) [id number]  PROCALCITONIN  TROPONIN I (HIGH SENSITIVITY)  TROPONIN I (HIGH SENSITIVITY)   ____________________________________________  EKG  Sinus rhythm with a ventricular rate of 86, right bundle branch block, nonspecific ST changes in inferior anterior and lateral leads without other clearance of acute ischemia. ____________________________________________  RADIOLOGY  ED MD interpretation:  Chest X-ray shows borderline cardiomegaly without overt edema, pneumothorax, effusion, focal consolidation or other clear acute thoracic process  Official radiology report(s): DG Chest Portable 1 View  Result Date: 03/04/2021 CLINICAL DATA:  This of breath for 2 days with cough, initial encounter EXAM: PORTABLE CHEST 1 VIEW COMPARISON:  07/13/2020 FINDINGS: Cardiac shadow is within normal limits. Postsurgical changes are noted. The lungs are clear bilaterally. No focal infiltrate or effusion is seen. No bony abnormality is noted. IMPRESSION: No acute abnormality noted. Electronically Signed   By: Alcide Clever M.D.   On: 03/04/2021 20:21    ____________________________________________   PROCEDURES  Procedure(s) performed (including Critical Care):  .Critical Care Performed by: Gilles Chiquito, MD Authorized by: Gilles Chiquito, MD   Critical care provider statement:    Critical care time (minutes):  30   Critical care was necessary to treat or prevent imminent or life-threatening deterioration of the following conditions:  Respiratory failure   Critical care was time spent personally by me on the following activities:  Development of treatment plan with patient or surrogate, discussions with  consultants, evaluation of patient's response to treatment, examination of patient, ordering and review of laboratory studies, ordering and review of radiographic studies, ordering and performing treatments and interventions, pulse oximetry, re-evaluation of patient's condition and review of old charts   ____________________________________________   INITIAL IMPRESSION / ASSESSMENT AND PLAN / ED COURSE      Patient presents with above-stated history exam persistent worsening cough and shortness of breath over the last couple days in the setting of chronic hypoxic respiratory failure and fairly advanced COPD.  He states he has been compliant with all his medications.  On arrival he is tachypneic with bilateral wheezing and has evidence of some accessory muscle use.  I suspect likely an acute COPD exacerbation.  ECG remarkable for sinus rhythm with a ventricular rate of 86, right bundle branch block, nonspecific ST changes in inferior anterior and lateral leads without other clearance of acute ischemia.  He is denying any chest pain and given troponin of 9 I have a low suspicion for ACS or myocarditis.  Chest X-ray shows borderline cardiomegaly without overt edema, pneumothorax, effusion, focal consolidation or other clear acute thoracic process  CBC shows no leukocytosis or acute anemia.  Procalcitonin 0.11 and given absence of fever or leukocytosis or focal consolidation on the chest x-ray I have a low suspicion for bacterial pneumonia at this time.  INR is therapeutic at 2.8 and thus I have a low suspicion for PE.  CMP shows no significant electrolyte or metabolic derangements.  Patient is mentating appropriately and does  not require VBG at this time.  He was given additional DuoNeb on arrival although following this he is still tachypneic and on trial of ambulation was noted to be hypoxic with SPO2 in the 80s patient became tachypneic in the 30s.  I will admit to medicine service for further  evaluation management of acute on chronic hypoxic respiratory failure secondary to COPD.      ____________________________________________   FINAL CLINICAL IMPRESSION(S) / ED DIAGNOSES  Final diagnoses:  COPD exacerbation (HCC)  Acute on chronic respiratory failure with hypoxia (HCC)  Anticoagulated    Medications  ipratropium-albuterol (DUONEB) 0.5-2.5 (3) MG/3ML nebulizer solution 9 mL (9 mLs Nebulization Given 03/04/21 2111)     ED Discharge Orders     None        Note:  This document was prepared using Dragon voice recognition software and may include unintentional dictation errors.    Gilles Chiquito, MD 03/04/21 [phone]

## 2021-03-04 NOTE — ED Triage Notes (Addendum)
C/o difficulty breathing x 2 days with cough and white sputum.  125 solumedrol and 2 duonebs given by EMS. Hx COPD, cabag - 3 liters at baseline

## 2021-03-04 NOTE — ED Notes (Addendum)
xray at bedside.  

## 2021-03-04 NOTE — ED Notes (Addendum)
Pt ambulatory trial performed.  Pt began with oxygen at 94% on 3 liters which is pts baseline. When ambulating ,pts oxygen dropped down to 84% and respirations increased to 34. When ambulating patient states he felt woozy. Provider will be notified.

## 2021-03-04 NOTE — ED Notes (Signed)
ED Provider at bedside. 

## 2021-03-05 DIAGNOSIS — K219 Gastro-esophageal reflux disease without esophagitis: Secondary | ICD-10-CM

## 2021-03-05 DIAGNOSIS — E1169 Type 2 diabetes mellitus with other specified complication: Secondary | ICD-10-CM

## 2021-03-05 DIAGNOSIS — E785 Hyperlipidemia, unspecified: Secondary | ICD-10-CM

## 2021-03-05 DIAGNOSIS — N400 Enlarged prostate without lower urinary tract symptoms: Secondary | ICD-10-CM | POA: Insufficient documentation

## 2021-03-05 DIAGNOSIS — D6861 Antiphospholipid syndrome: Secondary | ICD-10-CM

## 2021-03-05 LAB — TROPONIN I (HIGH SENSITIVITY): Troponin I (High Sensitivity): 9 ng/L (ref <18)

## 2021-03-05 LAB — BASIC METABOLIC PANEL
Anion gap: 5 (ref 5–15)
BUN: 16 mg/dL (ref 8–23)
CO2: 34 mmol/L — ABNORMAL HIGH (ref 22–32)
Calcium: 8.8 mg/dL — ABNORMAL LOW (ref 8.9–10.3)
Chloride: 100 mmol/L (ref 98–111)
Creatinine, Ser: 0.76 mg/dL (ref 0.61–1.24)
GFR, Estimated: >60 mL/min (ref >60)
Glucose, Bld: 245 mg/dL — ABNORMAL HIGH (ref 70–99)
Potassium: 4.8 mmol/L (ref 3.5–5.1)
Sodium: 139 mmol/L (ref 135–145)

## 2021-03-05 LAB — CBC
HCT: 41.9 % (ref 39.0–52.0)
Hemoglobin: 13 g/dL (ref 13.0–17.0)
MCH: 28.8 pg (ref 26.0–34.0)
MCHC: 31 g/dL (ref 30.0–36.0)
MCV: 92.7 fL (ref 80.0–100.0)
Platelets: 218 10*3/uL (ref 150–400)
RBC: 4.52 MIL/uL (ref 4.22–5.81)
RDW: 15.1 % (ref 11.5–15.5)
WBC: 6.2 10*3/uL (ref 4.0–10.5)
nRBC: 0 % (ref 0.0–0.2)

## 2021-03-05 LAB — CBG MONITORING, ED
Glucose-Capillary: 178 mg/dL — ABNORMAL HIGH (ref 70–99)
Glucose-Capillary: 192 mg/dL — ABNORMAL HIGH (ref 70–99)

## 2021-03-05 LAB — HEMOGLOBIN A1C
Hgb A1c MFr Bld: 8.1 % — ABNORMAL HIGH (ref 4.8–5.6)
Mean Plasma Glucose: 186 mg/dL

## 2021-03-05 LAB — PROTIME-INR
INR: 2.6 — ABNORMAL HIGH (ref 0.8–1.2)
Prothrombin Time: 27.7 seconds — ABNORMAL HIGH (ref 11.4–15.2)

## 2021-03-05 MED ORDER — INSULIN ASPART 100 UNIT/ML IJ SOLN
0.0000 [IU] | Freq: Three times a day (TID) | INTRAMUSCULAR | Status: DC
Start: 2021-03-05 — End: 2021-03-07
  Administered 2021-03-05 – 2021-03-06 (×4): 2 [IU] via SUBCUTANEOUS
  Administered 2021-03-07: 13:00:00 7 [IU] via SUBCUTANEOUS
  Administered 2021-03-07: 09:00:00 1 [IU] via SUBCUTANEOUS
  Filled 2021-03-05 (×6): qty 1

## 2021-03-05 MED ORDER — ACETAMINOPHEN 325 MG PO TABS: 650.0000 mg | ORAL_TABLET | Freq: Four times a day (QID) | ORAL | Status: DC | PRN

## 2021-03-05 MED ORDER — ACETAMINOPHEN 650 MG RE SUPP: 650.0000 mg | Freq: Four times a day (QID) | RECTAL | Status: DC | PRN

## 2021-03-05 MED ORDER — METOCLOPRAMIDE HCL 5 MG/ML IJ SOLN: 5.0000 mg | Freq: Four times a day (QID) | INTRAMUSCULAR | Status: DC | PRN

## 2021-03-05 MED ORDER — SODIUM CHLORIDE 0.9 % IV SOLN
1.0000 g | INTRAVENOUS | Status: DC
  Administered 2021-03-05 – 2021-03-07 (×3): 1 g via INTRAVENOUS
  Filled 2021-03-05: qty 10
  Filled 2021-03-05: qty 1
  Filled 2021-03-05 (×2): qty 10

## 2021-03-05 MED ORDER — ASPIRIN EC 81 MG PO TBEC
81.0000 mg | DELAYED_RELEASE_TABLET | Freq: Every day | ORAL | Status: DC
  Administered 2021-03-05 – 2021-03-06 (×2): 81 mg via ORAL
  Filled 2021-03-05 (×2): qty 1

## 2021-03-05 MED ORDER — WARFARIN - PHARMACIST DOSING INPATIENT
Freq: Every day | Status: DC
Start: 2021-03-05 — End: 2021-03-07
  Filled 2021-03-05: qty 1

## 2021-03-05 MED ORDER — METOPROLOL TARTRATE 25 MG PO TABS
12.5000 mg | ORAL_TABLET | Freq: Two times a day (BID) | ORAL | Status: DC
  Administered 2021-03-05 – 2021-03-07 (×5): 12.5 mg via ORAL
  Filled 2021-03-05 (×5): qty 1

## 2021-03-05 MED ORDER — ALBUTEROL SULFATE (2.5 MG/3ML) 0.083% IN NEBU
2.5000 mg | INHALATION_SOLUTION | Freq: Four times a day (QID) | RESPIRATORY_TRACT | Status: DC
  Administered 2021-03-05 – 2021-03-07 (×8): 2.5 mg via RESPIRATORY_TRACT
  Filled 2021-03-05 (×8): qty 3

## 2021-03-05 MED ORDER — WARFARIN SODIUM 7.5 MG PO TABS
7.5000 mg | ORAL_TABLET | Freq: Once | ORAL | Status: AC
  Administered 2021-03-05: 16:00:00 7.5 mg via ORAL
  Filled 2021-03-05: qty 1

## 2021-03-05 MED ORDER — PREDNISONE 20 MG PO TABS: 40.0000 mg | ORAL_TABLET | Freq: Every day | ORAL | Status: DC

## 2021-03-05 MED ORDER — TAMSULOSIN HCL 0.4 MG PO CAPS
0.8000 mg | ORAL_CAPSULE | Freq: Every day | ORAL | Status: DC
  Administered 2021-03-05 – 2021-03-07 (×3): 0.8 mg via ORAL
  Filled 2021-03-05 (×3): qty 2

## 2021-03-05 MED ORDER — TORSEMIDE 20 MG PO TABS
20.0000 mg | ORAL_TABLET | Freq: Every day | ORAL | Status: DC
  Administered 2021-03-05 – 2021-03-07 (×3): 20 mg via ORAL
  Filled 2021-03-05 (×3): qty 1

## 2021-03-05 MED ORDER — METHYLPREDNISOLONE SODIUM SUCC 40 MG IJ SOLR
40.0000 mg | Freq: Two times a day (BID) | INTRAMUSCULAR | Status: DC
  Administered 2021-03-05 – 2021-03-07 (×4): 40 mg via INTRAVENOUS
  Filled 2021-03-05 (×4): qty 1

## 2021-03-05 MED ORDER — SODIUM CHLORIDE 0.9 % IV SOLN: INTRAVENOUS | Status: DC

## 2021-03-05 MED ORDER — ROSUVASTATIN CALCIUM 10 MG PO TABS
10.0000 mg | ORAL_TABLET | Freq: Every evening | ORAL | Status: DC
  Administered 2021-03-05 – 2021-03-06 (×2): 10 mg via ORAL
  Filled 2021-03-05 (×3): qty 1

## 2021-03-05 MED ORDER — WARFARIN SODIUM 6 MG PO TABS
6.0000 mg | ORAL_TABLET | Freq: Every day | ORAL | Status: DC
  Administered 2021-03-06: 17:00:00 6 mg via ORAL
  Filled 2021-03-05 (×2): qty 1

## 2021-03-05 MED ORDER — WARFARIN - PHYSICIAN DOSING INPATIENT
Freq: Every day | Status: DC
Start: 2021-03-05 — End: 2021-03-05
  Filled 2021-03-05: qty 1

## 2021-03-05 MED ORDER — INSULIN ASPART 100 UNIT/ML IJ SOLN
0.0000 [IU] | Freq: Every day | INTRAMUSCULAR | Status: DC
  Administered 2021-03-06: 21:00:00 2 [IU] via SUBCUTANEOUS
  Filled 2021-03-05: qty 1

## 2021-03-05 MED ORDER — GABAPENTIN 600 MG PO TABS
600.0000 mg | ORAL_TABLET | Freq: Three times a day (TID) | ORAL | Status: DC
  Administered 2021-03-05 – 2021-03-07 (×7): 600 mg via ORAL
  Filled 2021-03-05 (×7): qty 1

## 2021-03-05 MED ORDER — VITAMIN D (ERGOCALCIFEROL) 1.25 MG (50000 UNIT) PO CAPS: 50000.0000 [IU] | ORAL_CAPSULE | ORAL | Status: DC

## 2021-03-05 MED ORDER — FLUTICASONE-UMECLIDIN-VILANT 200-62.5-25 MCG/INH IN AEPB: 1.0000 | INHALATION_SPRAY | Freq: Every day | RESPIRATORY_TRACT | Status: DC

## 2021-03-05 MED ORDER — GLIMEPIRIDE 4 MG PO TABS: 4.0000 mg | ORAL_TABLET | Freq: Two times a day (BID) | ORAL | Status: DC

## 2021-03-05 MED ORDER — UMECLIDINIUM BROMIDE 62.5 MCG/ACT IN AEPB
1.0000 | INHALATION_SPRAY | Freq: Every day | RESPIRATORY_TRACT | Status: DC
  Administered 2021-03-05 – 2021-03-07 (×3): 1 via RESPIRATORY_TRACT
  Filled 2021-03-05 (×2): qty 7

## 2021-03-05 MED ORDER — METHYLPREDNISOLONE SODIUM SUCC 40 MG IJ SOLR
40.0000 mg | Freq: Two times a day (BID) | INTRAMUSCULAR | Status: DC
  Administered 2021-03-05: 09:00:00 40 mg via INTRAVENOUS
  Filled 2021-03-05: qty 1

## 2021-03-05 MED ORDER — GLIMEPIRIDE 4 MG PO TABS
4.0000 mg | ORAL_TABLET | Freq: Two times a day (BID) | ORAL | Status: DC
  Administered 2021-03-05 – 2021-03-07 (×5): 4 mg via ORAL
  Filled 2021-03-05 (×6): qty 1

## 2021-03-05 MED ORDER — NITROGLYCERIN 0.4 MG SL SUBL: 0.4000 mg | SUBLINGUAL_TABLET | SUBLINGUAL | Status: DC | PRN

## 2021-03-05 MED ORDER — DULOXETINE HCL 30 MG PO CPEP
60.0000 mg | ORAL_CAPSULE | Freq: Every day | ORAL | Status: DC
  Administered 2021-03-05: 11:00:00 60 mg via ORAL
  Filled 2021-03-05: qty 1
  Filled 2021-03-05: qty 2

## 2021-03-05 MED ORDER — MAGNESIUM HYDROXIDE 400 MG/5ML PO SUSP
30.0000 mL | Freq: Every day | ORAL | Status: DC | PRN
  Filled 2021-03-05: qty 30

## 2021-03-05 MED ORDER — FLUTICASONE FUROATE-VILANTEROL 200-25 MCG/ACT IN AEPB
1.0000 | INHALATION_SPRAY | Freq: Every day | RESPIRATORY_TRACT | Status: DC
  Administered 2021-03-05 – 2021-03-07 (×3): 1 via RESPIRATORY_TRACT
  Filled 2021-03-05 (×2): qty 28

## 2021-03-05 MED ORDER — TOPIRAMATE 25 MG PO TABS
75.0000 mg | ORAL_TABLET | Freq: Two times a day (BID) | ORAL | Status: DC
  Administered 2021-03-05 – 2021-03-07 (×5): 75 mg via ORAL
  Filled 2021-03-05 (×6): qty 3

## 2021-03-05 MED ORDER — TRAZODONE HCL 50 MG PO TABS: 25.0000 mg | ORAL_TABLET | Freq: Every evening | ORAL | Status: DC | PRN

## 2021-03-05 MED ORDER — PANTOPRAZOLE SODIUM 40 MG PO TBEC
40.0000 mg | DELAYED_RELEASE_TABLET | Freq: Two times a day (BID) | ORAL | Status: DC
  Administered 2021-03-05 – 2021-03-07 (×5): 40 mg via ORAL
  Filled 2021-03-05 (×5): qty 1

## 2021-03-05 MED ORDER — WARFARIN SODIUM 6 MG PO TABS: 6.0000 mg | ORAL_TABLET | Freq: Every day | ORAL | Status: DC

## 2021-03-05 NOTE — ED Notes (Signed)
This RN asked pt. If he is diabetic, Dr. Renae Gloss notified, and asked if CBG monitoring was necessary for pt.

## 2021-03-05 NOTE — Progress Notes (Signed)
Patient ID: Elaine Middleton, male   DOB: 09-24-1955, 65 y.o.   MRN: [id number] [facility] PROGRESS NOTE  Jevante Hollibaugh SEG:[id number] DOB: 10/19/1955 DOA: 03/04/2021 PCP: [name], MD  HPI/Subjective: Patient coming in with shortness of breath, wheezing and coughing.  He chronically wears 3 L of oxygen and he states at home his pulse ox was in the 80s.  Objective: Vitals:   03/05/21 1545 03/05/21 1645  BP:    Pulse: 72   Resp:    Temp:    SpO2: 94% 92%    Intake/Output Summary (Last 24 hours) at 03/05/2021 1709 Last data filed at 03/05/2021 7371 Gross per 24 hour  Intake 1.68 ml  Output 1300 ml  Net -1298.32 ml   Filed Weights   03/04/21 1944  Weight: 111.1 kg    ROS: Review of Systems  Respiratory:  Negative for shortness of breath.   Cardiovascular:  Negative for chest pain.  Gastrointestinal:  Negative for abdominal pain, nausea and vomiting.  Exam: Physical Exam HENT:     Head: Normocephalic.     Mouth/Throat:     Pharynx: No oropharyngeal exudate.  Eyes:     General: Lids are normal.     Conjunctiva/sclera: Conjunctivae normal.  Cardiovascular:     Rate and Rhythm: Normal rate and regular rhythm.     Heart sounds: Normal heart sounds, S1 normal and S2 normal.  Pulmonary:     Breath sounds: Examination of the right-middle field reveals wheezing. Examination of the left-middle field reveals wheezing. Examination of the right-lower field reveals decreased breath sounds and rhonchi. Examination of the left-lower field reveals decreased breath sounds and rhonchi. Decreased breath sounds, wheezing and rhonchi present. No rales.  Musculoskeletal:     Right lower leg: Swelling present.     Left lower leg: Swelling present.  Skin:    General: Skin is warm.     Findings: No rash.     Comments: Chronic lower extremity skin discoloration  Neurological:     Mental Status: He is alert and oriented to person, place, and time.      Scheduled Meds:   albuterol  2.5 mg Nebulization Q6H   aspirin EC  81 mg Oral QHS   DULoxetine  60 mg Oral Daily   fluticasone furoate-vilanterol  1 puff Inhalation Daily   And   umeclidinium bromide  1 puff Inhalation Daily   gabapentin  600 mg Oral TID   glimepiride  4 mg Oral BID   insulin aspart  0-5 Units Subcutaneous QHS   insulin aspart  0-9 Units Subcutaneous TID WC   methylPREDNISolone (SOLU-MEDROL) injection  40 mg Intravenous Q12H   Followed by   Melene Muller ON 03/06/2021] predniSONE  40 mg Oral Q breakfast   metoprolol tartrate  12.5 mg Oral BID   pantoprazole  40 mg Oral BID   rosuvastatin  10 mg Oral QPM   tamsulosin  0.8 mg Oral Daily   topiramate  75 mg Oral BID   torsemide  20 mg Oral Daily   [START ON 03/09/2021] Vitamin D (Ergocalciferol)  50,000 Units Oral Weekly   [START ON 03/06/2021] warfarin  6 mg Oral q1600   Warfarin - Pharmacist Dosing Inpatient   Does not apply q1600   Continuous Infusions:  cefTRIAXone (ROCEPHIN)  IV Stopped (03/05/21 0106)    Assessment/Plan:  COPD exacerbation.  Continue Solu-Medrol, nebulizer treatments, Trelegy inhaler.  Admitting physician ordered Rocephin. Acute on chronic hypoxic respiratory failure.  Patient chronically wears 3 L  of oxygen.  Had a pulse ox of 89% and was as high as 6 L during the hospital course.  I dialed him down to his usual 3.  Patient does have a trilogy machine at home and I ordered BiPAP here. Antiphospholipid antibody syndrome on Coumadin.  Pharmacy to dose Essential hypertension on metoprolol GERD on PPI Overactive bladder on Ditropan Type 2 diabetes mellitus with hyperlipidemia and peripheral neuropathy on Amaryl, Crestor and Neurontin.  Sugars will likely be higher while on steroids. BPH on Flomax Lower extremity edema on torsemide Obesity with a BMI of 38.37     Code Status:     Code Status Orders  (From admission, onward)           Start     Ordered   03/05/21 0018  Do not attempt resuscitation (DNR)   Continuous       Question Answer Comment  In the event of cardiac or respiratory ARREST Do not call a code blue   In the event of cardiac or respiratory ARREST Do not perform Intubation, CPR, defibrillation or ACLS   In the event of cardiac or respiratory ARREST Use medication by any route, position, wound care, and other measures to relive pain and suffering. May use oxygen, suction and manual treatment of airway obstruction as needed for comfort.      03/05/21 0022           Code Status History     Date Active Date Inactive Code Status Order ID Comments User Context   07/13/2020 2123 07/20/2020 1255 DNR [id number]  Therisa Doyne, MD ED   01/18/2020 1911 01/23/2020 0040 Full Code [id number]  Eliezer Bottom, MD ED   03/14/2019 1701 03/22/2019 2328 Full Code [id number]  Joseph Art, DO ED   02/11/2017 0034 02/12/2017 1846 Full Code [id number]  Hillary Bow, DO ED   08/16/2016 0719 08/17/2016 1644 Full Code [id number]  Marcos Eke, PA-C ED      Family Communication: Left message for wife on the phone Disposition Plan: Status is: Inpatient  Antibiotics: Rocephin  Alford Highland  [facility]

## 2021-03-05 NOTE — Consult Note (Signed)
ANTICOAGULATION CONSULT NOTE - Initial Consult  Pharmacy Consult for warfarin Indication:  antiphospholipid syndrome  Allergies  Allergen Reactions   Melatonin Anxiety   Ramipril     cough    Patient Measurements: Height: 5\' 7"  (170.2 cm) Weight: 111.1 kg (245 lb) IBW/kg (Calculated) : 66.1  Vital Signs: BP: 134/67 (12/21 0813) Pulse Rate: 73 (12/21 0813)  Labs: Recent Labs    03/04/21 1947 03/05/21 0056 03/05/21 0654  HGB 12.7*  --  13.0  HCT 40.6  --  41.9  PLT 212  --  218  LABPROT 29.5*  --   --   INR 2.8*  --   --   CREATININE 0.81  --  0.76  TROPONINIHS 9 9  --     Estimated Creatinine Clearance: 109.5 mL/min (by C-G formula based on SCr of 0.76 mg/dL).   Medical History: Past Medical History:  Diagnosis Date   Antiphospholipid antibody syndrome (HCC)    CAD S/P percutaneous coronary angioplasty '98, '04, Feb 2017   [facility]   Carotid arterial disease (HCC) 10/2015   moderate bilateral   COPD (chronic obstructive pulmonary disease) (HCC)    Coronary artery disease    Hypertension    Non-insulin treated type 2 diabetes mellitus (HCC)    Pulmonary emboli (HCC) 2011   RBBB    Sleep apnea     Medications:  (Not in a hospital admission)   Assessment: PMH significant for CAD, antiphospholipid antibody syndrome, carotid stenosis, COPD, HTN, DM and hx of PE. Admitted for COPD exacerbation. Pharmacy consulted to manage warfarin while inpatient.  Last INR prior to admission 3.7 (per patients report), warfarin dose decreased to 6mg  daily by PCP on 02/28/2021  Goal of Therapy:  INR 2.5-3 Monitor platelets by anticoagulation protocol: Yes   Date INR Comment 12/20 2.8 Admitted - no warfarin given 12/21 2.6 Warfarin 7.5mg  today  Plan:  INR today lower than admission and barely therapeutic Will order warfarin 7.5mg  today, then resume warfarin 6mg  daily Daily INR x 3 days  Jana Swartzlander Rodriguez-Guzman PharmD, BCPS 03/05/2021 10:30 AM

## 2021-03-05 NOTE — H&P (Signed)
[place]   PATIENT NAME: Jose [name]    MR#:  [id number]  DATE OF BIRTH:  [birth year]  DATE OF ADMISSION:  03/04/2021  PRIMARY CARE PHYSICIAN: [name], MD   Patient is coming from: Home  REQUESTING/REFERRING PHYSICIAN: Antoine Primas, MD  CHIEF COMPLAINT:   Chief Complaint  Patient presents with   Shortness of Breath    HISTORY OF PRESENT ILLNESS:  Jose [name] is a 65 y.o. male with medical history significant for coronary artery disease, antiphospholipid antibody syndrome, carotid stenosis, COPD, hypertension, type 2 diabetes mellitus and PE as well as sleep apnea, who presented to the ER with acute onset of worsening cough which has been dry and later productive of clear sputum as well as wheezing and dyspnea over the last week.  He denied any fever or chills.  He has been feeling generally weak.  No nausea or vomiting or abdominal pain no dysuria, oliguria or hematuria or flank pain.  He was hypoxic in the mid 70s on his home O2 at 3 L/min. He had adequate O2 sats on 4 L.    ED Course:  When he came to the ER blood pressure was 144/80 and at that time pulse symmetry was high percent on Ventimask at 6 L/min.  CBC showed hemoglobin of 12.7, INR 2.8 and PT 29.5.  Influenza antigens and COVID-19 PCR came back negative.  CMP revealed a chloride of 96 with CO2 34 and calcium 8.7 with otherwise unremarkable LFTs. EKG as reviewed by me : EKG showed sinus rhythm with a rate of 86 with left atrial enlargement and right bundle blanch block Imaging: Portable chest ray showed no acute cardiopulmonary disease.  The patient was given DuoNeb.  He will be admitted to a medical telemetry bed for further evaluation and management. PAST MEDICAL HISTORY:   Past Medical History:  Diagnosis Date   Antiphospholipid antibody syndrome (HCC)    CAD S/P percutaneous coronary angioplasty '98, '04, Feb 2017   [facility]   Carotid arterial disease (HCC) 10/2015   moderate bilateral   COPD  (chronic obstructive pulmonary disease) (HCC)    Coronary artery disease    Hypertension    Non-insulin treated type 2 diabetes mellitus (HCC)    Pulmonary emboli (HCC) 2011   RBBB    Sleep apnea     PAST SURGICAL HISTORY:   Past Surgical History:  Procedure Laterality Date   APPENDECTOMY     APPLICATION OF WOUND VAC Left 03/18/2019   Procedure: Application Of Wound Vac;  Surgeon: Nadara Mustard, MD;  Location: [facility] OR;  Service: Orthopedics;  Laterality: Left;   BACK SURGERY  '89, '06   COLONOSCOPY     CORONARY ANGIOPLASTY WITH STENT PLACEMENT  '98, '04, Feb 2017   I & D EXTREMITY Left 03/15/2019   Procedure: DEBRIDEMENT LEFT THIGH;  Surgeon: Nadara Mustard, MD;  Location: [facility] OR;  Service: Orthopedics;  Laterality: Left;   INCISION AND DRAINAGE OF WOUND Left 03/18/2019   Procedure: LEFT THIGH DEBRIDEMENT;  Surgeon: Nadara Mustard, MD;  Location: [facility] OR;  Service: Orthopedics;  Laterality: Left;    SOCIAL HISTORY:   Social History   Tobacco Use   Smoking status: Every Day    Packs/day: 0.50    Years: 40.00    Pack years: 20.00    Types: Cigarettes    Last attempt to quit: 01/15/2019    Years since quitting: 2.1   Smokeless tobacco: Never   Tobacco  comments:    has started smoking again, willing to quit.  Substance Use Topics   Alcohol use: No    FAMILY HISTORY:   Family History  Problem Relation Age of Onset   CAD Father 19    DRUG ALLERGIES:   Allergies  Allergen Reactions   Melatonin Anxiety   Ramipril     cough    REVIEW OF SYSTEMS:   ROS As per history of present illness. All pertinent systems were reviewed above. Constitutional, HEENT, cardiovascular, respiratory, GI, GU, musculoskeletal, neuro, psychiatric, endocrine, integumentary and hematologic systems were reviewed and are otherwise negative/unremarkable except for positive findings mentioned above in the HPI.   MEDICATIONS AT HOME:   Prior to Admission medications   Medication Sig Start Date End  Date Taking? Authorizing Provider  albuterol (VENTOLIN HFA) 108 (90 Base) MCG/ACT inhaler Inhale 2 puffs into the lungs every 6 (six) hours as needed for wheezing or shortness of breath.   Yes [provider]  aspirin EC 81 MG EC tablet Take 1 tablet (81 mg total) by mouth daily. Swallow whole. Patient taking differently: Take 81 mg by mouth at bedtime. 01/23/20  Yes Dagar, Meredith Staggers, MD  DULoxetine (CYMBALTA) 60 MG capsule Take 60 mg by mouth daily.   Yes [provider]  Fluticasone-Umeclidin-Vilant 200-62.5-25 MCG/INH AEPB Inhale 1 puff into the lungs daily.   Yes [provider]  gabapentin (NEURONTIN) 600 MG tablet Take 600 mg by mouth 3 (three) times daily.   Yes [provider]  glimepiride (AMARYL) 4 MG tablet Take 4 mg by mouth 2 (two) times daily. 12/04/20  Yes [provider]  metoprolol tartrate (LOPRESSOR) 12.5 mg TABS tablet Take 12.5 mg by mouth 2 (two) times daily.   Yes [provider]  pantoprazole (PROTONIX) 40 MG tablet Take 40 mg by mouth 2 (two) times daily.   Yes [provider]  rosuvastatin (CRESTOR) 10 MG tablet Take 10 mg by mouth every evening. 07/07/16  Yes [provider]  tamsulosin (FLOMAX) 0.4 MG CAPS capsule Take 0.8 mg by mouth daily.   Yes [provider]  topiramate (TOPAMAX) 50 MG tablet Take 75 mg by mouth 2 (two) times daily.   Yes [provider]  torsemide (DEMADEX) 20 MG tablet Take 20 mg by mouth daily.   Yes [provider]  Vitamin D, Ergocalciferol, (DRISDOL) 1.25 MG (50000 UNIT) CAPS capsule Take 50,000 Units by mouth once a week. 02/04/21  Yes [provider]  warfarin (COUMADIN) 6 MG tablet Take 6 mg by mouth daily at 4 PM.   Yes [provider]  albuterol (PROVENTIL) (5 MG/ML) 0.5% nebulizer solution Take 0.5 mLs (2.5 mg total) by nebulization every 6 (six) hours as needed for wheezing or shortness of breath. 12/27/18   Wyvonnia Dusky, MD   alfuzosin (UROXATRAL) 10 MG 24 hr tablet Take 10 mg by mouth daily. Patient not taking: Reported on 03/04/2021 12/04/20   [provider]  nitroGLYCERIN (NITROSTAT) 0.4 MG SL tablet Place 0.4 mg under the tongue every 5 (five) minutes as needed for chest pain.  07/07/16   [provider]  oxybutynin (DITROPAN) 5 MG tablet Take 5 mg by mouth 3 (three) times daily as needed. Patient not taking: Reported on 03/04/2021 01/23/21   [provider]  OZEMPIC, 1 MG/DOSE, 4 MG/3ML SOPN Inject 1 mg into the skin every Monday. Patient not taking: Reported on 03/04/2021    [provider]  warfarin (COUMADIN) 1 MG  tablet Take 1 mg by mouth See admin instructions. Take 1 tablet (1mg ) by mouth with 6mg  tablet (total 7mg ) every Monday and Friday evening Patient not taking: Reported on 03/04/2021    [provider]      VITAL SIGNS:  Blood pressure (!) 147/68, pulse 95, temperature 98.1 F (36.7 C), temperature source Oral, resp. rate (!) 27, height 5\' 7"  (1.702 m), weight 111.1 kg, SpO2 93 %.  PHYSICAL EXAMINATION:  Physical Exam  GENERAL:  65 y.o.-year-old Caucasian male patient lying in the bed with mild respiratory distress with conversational dyspnea.  EYES: Pupils equal, round, reactive to light and accommodation. No scleral icterus. Extraocular muscles intact.  HEENT: Head atraumatic, normocephalic. Oropharynx and nasopharynx clear.  NECK:  Supple, no jugular venous distention. No thyroid enlargement, no tenderness.  LUNGS: Diffuse expiratory wheezes with diminished expiratory airflow and harsh vesicular breathing.. No use of accessory muscles of respiration.  CARDIOVASCULAR: Regular rate and rhythm, S1, S2 normal. No murmurs, rubs, or gallops.  ABDOMEN: Soft, nondistended, nontender. Bowel sounds present. No organomegaly or mass.  EXTREMITIES: No pedal edema, cyanosis, or clubbing.  NEUROLOGIC: Cranial nerves II through XII are intact. Muscle strength 5/5  in all extremities. Sensation intact. Gait not checked.  PSYCHIATRIC: The patient is alert and oriented x 3.  Normal affect and good eye contact. SKIN: No obvious rash, lesion, or ulcer.   LABORATORY PANEL:   CBC Recent Labs  Lab 03/04/21 1947  WBC 10.3  HGB 12.7*  HCT 40.6  PLT 212   ------------------------------------------------------------------------------------------------------------------  Chemistries  Recent Labs  Lab 03/04/21 1947  NA 136  K 3.6  CL 96*  CO2 34*  GLUCOSE 120*  BUN 18  CREATININE 0.81  CALCIUM 8.7*  AST 19  ALT 23  ALKPHOS 69  BILITOT 0.5   ------------------------------------------------------------------------------------------------------------------  Cardiac Enzymes No results for input(s): TROPONINI in the last 168 hours. ------------------------------------------------------------------------------------------------------------------  RADIOLOGY:  DG Chest Portable 1 View  Result Date: 03/04/2021 CLINICAL DATA:  This of breath for 2 days with cough, initial encounter EXAM: PORTABLE CHEST 1 VIEW COMPARISON:  07/13/2020 FINDINGS: Cardiac shadow is within normal limits. Postsurgical changes are noted. The lungs are clear bilaterally. No focal infiltrate or effusion is seen. No bony abnormality is noted. IMPRESSION: No acute abnormality noted. Electronically Signed   By: Inez Catalina M.D.   On: 03/04/2021 20:21      IMPRESSION AND PLAN:  Principal Problem:   COPD exacerbation ([place])  1.  COPD acute exacerbation likely secondary to acute bronchitis.  The patient has subsequent acute on chronic hypoxic respiratory failure. - The patient will be admitted to a medical telemetry bed. - We will continue steroid therapy with IV Solu-Medrol. - Continue bronchodilator therapy with DuoNeb scheduled every 4 hours as needed. - We will add IV Rocephin given severity of COPD exacerbation. - Mucolytic therapy will be provided. - O2 protocol will  be followed. - We will hold off long-acting beta agonist therapy.  2.  Essential hypertension. - We will continue Lopressor.  3.  Antiphospholipid antibody syndrome. - We will continue anticoagulation with Coumadin.  4.  Dyslipidemia. - We will continue statin therapy.  5.  GERD. - We will continue PPI therapy.  6.  Overactive bladder. - We will continue Ditropan.  7.  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 - The patient will be placed on supplement coverage with NovoLog. - We will continue his Amaryl. - Will continue his Neurontin.    DVT prophylaxis: Coumadin.  Code Status: He is DNR/DNI Family Communication:  The plan of care was discussed in details with the patient (and family). I answered all questions. The patient agreed to proceed with the above mentioned plan. Further management will depend upon hospital course. Disposition Plan: Back to previous home environment Consults called: none. All the records are reviewed and case discussed with ED provider.  Status is: Inpatient  Remains inpatient appropriate because:Ongoing diagnostic testing needed not appropriate for outpatient work up, Unsafe d/c plan, IV treatments appropriate due to intensity of illness or inability to take PO, and Inpatient level of care appropriate due to severity of illness   Dispo: The patient is from: Home              Anticipated d/c is to: Home              Patient currently is not medically stable to d/c.              Difficult to place patient: No  Christel Mormon M.D on 03/05/2021 at 12:23 AM  [facility]   From 7 PM-7 AM, contact night-coverage [email or web address]  CC: Primary care physician; Elenore Paddy, MD

## 2021-03-05 NOTE — Evaluation (Signed)
Physical Therapy Evaluation Patient Details Name: Jose [name] MRN: [id number] DOB: Feb 18, 1956 Today's Date: 03/05/2021  History of Present Illness  Jose [name] is a 65 y.o. male with medical history significant for coronary artery disease, antiphospholipid antibody syndrome, carotid stenosis, COPD, hypertension, type 2 diabetes mellitus and PE as well as sleep apnea, who presented to the ER with acute onset of worsening cough which has been dry and later productive of clear sputum as well as wheezing and dyspnea over the last week.    Clinical Impression  Pt sitting EOB upon arrival and returned at conclusion of therapy.  Pt agreeable to performing ambulation during treatment session.  Pt able to perform transfers well with CGA for safety and then ambulated well without much difficulty.  Pt did have two standing rest breaks in which he experienced SOB, however was resolved with pursed lip breathing and no longer talking.  Pt encouraged to perform techniques when experiencing SOB and decreased SpO2 saturation levels.  Pt then returned back to room where he was left with all needs met.  Pt does note that he would like HHPT to assist in getting back to baseline at home and to look at his shower situation and provide some insight and recommendation in order to making bathing easier.  Pt will benefit from skilled PT intervention to increase independence and safety with basic mobility in preparation for discharge to the venue listed below.           Recommendations for follow up therapy are one component of a multi-disciplinary discharge planning process, led by the attending physician.  Recommendations may be updated based on patient status, additional functional criteria and insurance authorization.  Follow Up Recommendations Home health PT    Assistance Recommended at Discharge None  Functional Status Assessment Patient has had a recent decline in their functional status and demonstrates the  ability to make significant improvements in function in a reasonable and predictable amount of time.  Equipment Recommendations  None recommended by PT    Recommendations for Other Services       Precautions / Restrictions        Mobility  Bed Mobility Overal bed mobility: Modified Independent                  Transfers Overall transfer level: Needs assistance Equipment used: Rolling walker (2 wheels) Transfers: Sit to/from Stand Sit to Stand: Min guard           General transfer comment: Pt able to come into standing from elevated ED bed with good technique.    Ambulation/Gait Ambulation/Gait assistance: Min guard Gait Distance (Feet): 100 Feet Assistive device: Rolling walker (2 wheels) Gait Pattern/deviations: Step-through pattern;Decreased stride length Gait velocity: decreased     General Gait Details: Pt ambulates with slow, yet steady gait pattern.  Pt required two standing rest breaks to catch breathe and increase O2 saturation with pursed lip breathing technique.  Stairs            Wheelchair Mobility    Modified Rankin (Stroke Patients Only)       Balance Overall balance assessment: Needs assistance Sitting-balance support: No upper extremity supported;Feet unsupported Sitting balance-[name] Scale: Good       Standing balance-[name] Scale: Poor Standing balance comment: Pt relies heavily on the UE's for support.                             Pertinent Vitals/Pain  Home Living                          Prior Function                       Hand Dominance        Extremity/Trunk Assessment   Upper Extremity Assessment Upper Extremity Assessment: Overall WFL for tasks assessed    Lower Extremity Assessment Lower Extremity Assessment: Generalized weakness    Cervical / Trunk Assessment Cervical / Trunk Assessment: Kyphotic  Communication      Cognition                                                 General Comments      Exercises Other Exercises Other Exercises: Pt educated on roles of therapy and importance of exercise during stay at hospital.   Assessment/Plan    PT Assessment Patient needs continued PT services  PT Problem List Decreased strength;Decreased activity tolerance;Decreased balance;Decreased mobility;Decreased safety awareness       PT Treatment Interventions DME instruction;Gait training;Functional mobility training;Therapeutic activities;Therapeutic exercise;Balance training;Neuromuscular re-education    PT Goals (Current goals can be found in the Care Plan section)  Acute Rehab PT Goals Patient Stated Goal: go home. PT Goal Formulation: With patient Time For Goal Achievement: 03/19/21 Potential to Achieve Goals: Good    Frequency Min 2X/week   Barriers to discharge        Co-evaluation               AM-PAC PT "6 Clicks" Mobility  Outcome Measure Help needed turning from your back to your side while in a flat bed without using bedrails?: A Little Help needed moving from lying on your back to sitting on the side of a flat bed without using bedrails?: A Little Help needed moving to and from a bed to a chair (including a wheelchair)?: A Little Help needed standing up from a chair using your arms (e.g., wheelchair or bedside chair)?: A Little Help needed to walk in hospital room?: A Little Help needed climbing 3-5 steps with a railing? : A Little 6 Click Score: 18    End of Session Equipment Utilized During Treatment: Gait belt Activity Tolerance: Patient tolerated treatment well Patient left: in bed;with call bell/phone within reach Nurse Communication: Mobility status PT Visit Diagnosis: Unsteadiness on feet (R26.81);Other abnormalities of gait and mobility (R26.89);Muscle weakness (generalized) (M62.81);Difficulty in walking, not elsewhere classified (R26.2)    Time: 6226-3335 PT Time Calculation (min)  (ACUTE ONLY): 54 min   Charges:   PT Evaluation $PT Eval Low Complexity: 1 Low PT Treatments $Gait Training: 23-37 mins $Therapeutic Activity: 8-22 mins        Gwenlyn Saran, PT, DPT 03/05/21, 5:09 PM   Christie Nottingham 03/05/2021, 5:02 PM

## 2021-03-05 NOTE — ED Notes (Signed)
PT at bedside.

## 2021-03-05 NOTE — ED Notes (Signed)
Pt resting in bed. Appears to be sleeping at this time. Chest is rising and falling symmetrically. No acute distress noted. Will continue to monitor.   °

## 2021-03-05 NOTE — ED Notes (Signed)
This RN to bedside to assess pt. Pt. Sitting on side of bed, talking to PT, eating lunch. Pt. Speaking full sentences, joking and conversing with RN and PT. Pt. Has monitor leads off, agrees that after he is done eating, he will get back to stretcher and allow this RN to place him back on cardiac monitor.

## 2021-03-05 NOTE — ED Notes (Signed)
Pt given a snack 

## 2021-03-06 DIAGNOSIS — E669 Obesity, unspecified: Secondary | ICD-10-CM

## 2021-03-06 LAB — GLUCOSE, CAPILLARY
Glucose-Capillary: 153 mg/dL — ABNORMAL HIGH (ref 70–99)
Glucose-Capillary: 173 mg/dL — ABNORMAL HIGH (ref 70–99)
Glucose-Capillary: 188 mg/dL — ABNORMAL HIGH (ref 70–99)
Glucose-Capillary: 200 mg/dL — ABNORMAL HIGH (ref 70–99)
Glucose-Capillary: 212 mg/dL — ABNORMAL HIGH (ref 70–99)

## 2021-03-06 LAB — PROTIME-INR
INR: 2.7 — ABNORMAL HIGH (ref 0.8–1.2)
Prothrombin Time: 28.7 seconds — ABNORMAL HIGH (ref 11.4–15.2)

## 2021-03-06 MED ORDER — ORAL CARE MOUTH RINSE
15.0000 mL | Freq: Two times a day (BID) | OROMUCOSAL | Status: DC
  Administered 2021-03-06 – 2021-03-07 (×2): 15 mL via OROMUCOSAL

## 2021-03-06 MED ORDER — DULOXETINE HCL 30 MG PO CPEP
60.0000 mg | ORAL_CAPSULE | Freq: Every day | ORAL | Status: DC
  Administered 2021-03-06: 21:00:00 60 mg via ORAL
  Filled 2021-03-06: qty 2

## 2021-03-06 NOTE — Progress Notes (Addendum)
Patient ID: Jose [name], male   DOB: 1956/01/02, 65 y.o.   MRN: [id number] [facility] PROGRESS NOTE  Jose [name] [id number] DOB: Jul 17, 1955 DOA: 03/04/2021 PCP: Jose Jenkins, MD  HPI/Subjective: Patient feels about the same.  Still with some shortness of breath and wheezing.  Still with some cough.  Admitted with COPD exacerbation.  Objective: Vitals:   03/06/21 0809 03/06/21 1057  BP: (!) 143/78 134/76  Pulse: 80 71  Resp: 20 20  Temp: 97.7 F (36.5 C) 97.9 F (36.6 C)  SpO2: 96% 97%    Intake/Output Summary (Last 24 hours) at 03/06/2021 1413 Last data filed at 03/06/2021 1100 Gross per 24 hour  Intake 120 ml  Output --  Net 120 ml   Filed Weights   03/04/21 1944 03/06/21 0037  Weight: 111.1 kg 111.6 kg    ROS: Review of Systems  Respiratory:  Negative for shortness of breath.   Cardiovascular:  Negative for chest pain.  Gastrointestinal:  Negative for abdominal pain, nausea and vomiting.  Exam: Physical Exam HENT:     Head: Normocephalic.     Mouth/Throat:     Pharynx: No oropharyngeal exudate.  Eyes:     General: Lids are normal.     Conjunctiva/sclera: Conjunctivae normal.  Cardiovascular:     Rate and Rhythm: Normal rate and regular rhythm.     Heart sounds: Normal heart sounds, S1 normal and S2 normal.  Pulmonary:     Breath sounds: No decreased breath sounds, wheezing, rhonchi or rales.  Abdominal:     Palpations: Abdomen is soft.     Tenderness: There is no abdominal tenderness.  Musculoskeletal:     Right lower leg: Swelling present.     Left lower leg: Swelling present.  Skin:    General: Skin is warm.     Comments: Chronic lower extremity skin discoloration  Neurological:     Mental Status: He is alert and oriented to person, place, and time.      Scheduled Meds:  albuterol  2.5 mg Nebulization Q6H   aspirin EC  81 mg Oral QHS   DULoxetine  60 mg Oral QHS   fluticasone furoate-vilanterol  1 puff Inhalation Daily   And    umeclidinium bromide  1 puff Inhalation Daily   gabapentin  600 mg Oral TID   glimepiride  4 mg Oral BID   insulin aspart  0-5 Units Subcutaneous QHS   insulin aspart  0-9 Units Subcutaneous TID WC   mouth rinse  15 mL Mouth Rinse BID   methylPREDNISolone (SOLU-MEDROL) injection  40 mg Intravenous Q12H   metoprolol tartrate  12.5 mg Oral BID   pantoprazole  40 mg Oral BID   rosuvastatin  10 mg Oral QPM   tamsulosin  0.8 mg Oral Daily   topiramate  75 mg Oral BID   torsemide  20 mg Oral Daily   [START ON 03/09/2021] Vitamin D (Ergocalciferol)  50,000 Units Oral Weekly   warfarin  6 mg Oral q1600   Warfarin - Pharmacist Dosing Inpatient   Does not apply q1600   Continuous Infusions:  cefTRIAXone (ROCEPHIN)  IV 1 g (03/06/21 0141)    Assessment/Plan:  COPD exacerbation.  Continue Solu-Medrol 40 mg twice daily.  Continue nebulizer treatments and Trelegy inhaler.  On empiric Rocephin.  Incentive spirometer. Acute on chronic hypoxic respiratory failure.  Initially had a pulse ox of 89%.  Again this morning he was on 4 L of oxygen and I dialed him down to  3. Antiphospholipid syndrome on Coumadin and pharmacy dosing. Essential hypertension on metoprolol GERD on PPI Overactive bladder on Ditropan Type 2 diabetes mellitus with hyperlipidemia and peripheral neuropathy on Amaryl, Crestor and Neurontin. Lower extremity edema on torsemide Obesity with a BMI of 38.53    Code Status:     Code Status Orders  (From admission, onward)           Start     Ordered   03/05/21 0018  Do not attempt resuscitation (DNR)  Continuous       Question Answer Comment  In the event of cardiac or respiratory ARREST Do not call a code blue   In the event of cardiac or respiratory ARREST Do not perform Intubation, CPR, defibrillation or ACLS   In the event of cardiac or respiratory ARREST Use medication by any route, position, wound care, and other measures to relive pain and suffering. May use  oxygen, suction and manual treatment of airway obstruction as needed for comfort.      03/05/21 0022           Code Status History     Date Active Date Inactive Code Status Order ID Comments User Context   07/13/2020 2123 07/20/2020 1255 DNR [id number]  Jose Doyne, MD ED   01/18/2020 1911 01/23/2020 0040 Full Code [id number]  Jose Bottom, MD ED   03/14/2019 1701 03/22/2019 2328 Full Code [id number]  Jose Art, DO ED   02/11/2017 0034 02/12/2017 1846 Full Code [id number]  Hillary Bow, DO ED   08/16/2016 0719 08/17/2016 1644 Full Code [id number]  Jose Eke, PA-C ED      Family Communication: Left message for wife Disposition Plan: Status is: Inpatient  Hilbert [name] [facility]  [facility]

## 2021-03-06 NOTE — Consult Note (Signed)
ANTICOAGULATION CONSULT NOTE - Initial Consult  Pharmacy Consult for warfarin Indication:  antiphospholipid syndrome  Allergies  Allergen Reactions   Melatonin Anxiety   Ramipril     cough    Patient Measurements: Height: 5\' 7"  (170.2 cm) Weight: 111.6 kg (246 lb 0.5 oz) IBW/kg (Calculated) : 66.1  Vital Signs: Temp: 97.7 F (36.5 C) (12/22 0614) Temp Source: Oral (12/22 0614) BP: 139/63 (12/22 0614) Pulse Rate: 71 (12/22 0614)  Labs: Recent Labs    03/04/21 1947 03/05/21 0056 03/05/21 0654 03/05/21 1039 03/06/21 0437  HGB 12.7*  --  13.0  --   --   HCT 40.6  --  41.9  --   --   PLT 212  --  218  --   --   LABPROT 29.5*  --   --  27.7* 28.7*  INR 2.8*  --   --  2.6* 2.7*  CREATININE 0.81  --  0.76  --   --   TROPONINIHS 9 9  --   --   --      Estimated Creatinine Clearance: 109.8 mL/min (by C-G formula based on SCr of 0.76 mg/dL).   Medical History: Past Medical History:  Diagnosis Date   Antiphospholipid antibody syndrome (HCC)    CAD S/P percutaneous coronary angioplasty '98, '04, Feb 2017   [facility]   Carotid arterial disease (HCC) 10/2015   moderate bilateral   COPD (chronic obstructive pulmonary disease) (HCC)    Coronary artery disease    Hypertension    Non-insulin treated type 2 diabetes mellitus (HCC)    Pulmonary emboli (HCC) 2011   RBBB    Sleep apnea     Medications:  Medications Prior to Admission  Medication Sig Dispense Refill Last Dose   albuterol (VENTOLIN HFA) 108 (90 Base) MCG/ACT inhaler Inhale 2 puffs into the lungs every 6 (six) hours as needed for wheezing or shortness of breath.   prn at prn   aspirin EC 81 MG EC tablet Take 1 tablet (81 mg total) by mouth daily. Swallow whole. (Patient taking differently: Take 81 mg by mouth at bedtime.) 30 tablet 11 03/03/2021   DULoxetine (CYMBALTA) 60 MG capsule Take 60 mg by mouth daily.   03/03/2021   Fluticasone-Umeclidin-Vilant 200-62.5-25 MCG/INH AEPB Inhale 1 puff into the lungs daily.    03/04/2021   gabapentin (NEURONTIN) 600 MG tablet Take 600 mg by mouth 3 (three) times daily.   03/04/2021   glimepiride (AMARYL) 4 MG tablet Take 4 mg by mouth 2 (two) times daily.   03/04/2021   metoprolol tartrate (LOPRESSOR) 12.5 mg TABS tablet Take 12.5 mg by mouth 2 (two) times daily.   03/04/2021   pantoprazole (PROTONIX) 40 MG tablet Take 40 mg by mouth 2 (two) times daily.   03/04/2021   rosuvastatin (CRESTOR) 10 MG tablet Take 10 mg by mouth every evening.  11 03/03/2021   tamsulosin (FLOMAX) 0.4 MG CAPS capsule Take 0.8 mg by mouth daily.  0 03/04/2021   topiramate (TOPAMAX) 50 MG tablet Take 75 mg by mouth 2 (two) times daily.  0 03/04/2021   torsemide (DEMADEX) 20 MG tablet Take 20 mg by mouth daily.   03/04/2021   Vitamin D, Ergocalciferol, (DRISDOL) 1.25 MG (50000 UNIT) CAPS capsule Take 50,000 Units by mouth once a week.   Past Week   warfarin (COUMADIN) 6 MG tablet Take 6 mg by mouth daily at 4 PM.   03/03/2021   albuterol (PROVENTIL) (5 MG/ML) 0.5% nebulizer solution Take  0.5 mLs (2.5 mg total) by nebulization every 6 (six) hours as needed for wheezing or shortness of breath. 20 mL 12 prn at prn   alfuzosin (UROXATRAL) 10 MG 24 hr tablet Take 10 mg by mouth daily. (Patient not taking: Reported on 03/04/2021)   Not Taking   nitroGLYCERIN (NITROSTAT) 0.4 MG SL tablet Place 0.4 mg under the tongue every 5 (five) minutes as needed for chest pain.   1 prn at prn   oxybutynin (DITROPAN) 5 MG tablet Take 5 mg by mouth 3 (three) times daily as needed. (Patient not taking: Reported on 03/04/2021)   Not Taking   OZEMPIC, 1 MG/DOSE, 4 MG/3ML SOPN Inject 1 mg into the skin every Monday. (Patient not taking: Reported on 03/04/2021)   Not Taking   warfarin (COUMADIN) 1 MG tablet Take 1 mg by mouth See admin instructions. Take 1 tablet (1mg ) by mouth with 6mg  tablet (total 7mg ) every Monday and Friday evening (Patient not taking: Reported on 03/04/2021)   Not Taking    Assessment: PMH  significant for CAD, antiphospholipid antibody syndrome, carotid stenosis, COPD, HTN, DM and hx of PE. Admitted for COPD exacerbation. Pharmacy consulted to manage warfarin while inpatient.  Last INR prior to admission 3.7 (per patients report), warfarin dose decreased to 6mg  daily by PCP on 02/28/2021  New DDI with warfarin: ceftriaxone and solu-medrol (moderate)  Goal of Therapy:  INR 2.5-3 Monitor platelets by anticoagulation protocol: Yes   Date INR Comment 12/20 2.8 Admitted - no warfarin given 12/21 2.6 Warfarin 7.5mg  today 12/22 2.7 Warfarin 6mg  daily  Plan:  INR remains therapeutic Will resume warfarin home dose of 6mg  daily INR tomorrow with AM labs  Journee Bobrowski Rodriguez-Guzman PharmD, BCPS 03/06/2021 7:51 AM

## 2021-03-07 LAB — PROTIME-INR
INR: 2.6 — ABNORMAL HIGH (ref 0.8–1.2)
Prothrombin Time: 27.7 seconds — ABNORMAL HIGH (ref 11.4–15.2)

## 2021-03-07 LAB — GLUCOSE, CAPILLARY
Glucose-Capillary: 136 mg/dL — ABNORMAL HIGH (ref 70–99)
Glucose-Capillary: 316 mg/dL — ABNORMAL HIGH (ref 70–99)

## 2021-03-07 MED ORDER — TRAZODONE HCL 50 MG PO TABS: 25.0000 mg | ORAL_TABLET | Freq: Every evening | ORAL | 0 refills | Status: DC | PRN

## 2021-03-07 MED ORDER — AMOXICILLIN-POT CLAVULANATE 875-125 MG PO TABS: 1.0000 | ORAL_TABLET | Freq: Two times a day (BID) | ORAL | 0 refills | Status: AC

## 2021-03-07 MED ORDER — ALBUTEROL SULFATE (2.5 MG/3ML) 0.083% IN NEBU: 2.5000 mg | INHALATION_SOLUTION | RESPIRATORY_TRACT | Status: DC | PRN

## 2021-03-07 MED ORDER — IPRATROPIUM-ALBUTEROL 0.5-2.5 (3) MG/3ML IN SOLN
3.0000 mL | Freq: Three times a day (TID) | RESPIRATORY_TRACT | Status: DC
  Administered 2021-03-07: 14:00:00 3 mL via RESPIRATORY_TRACT
  Filled 2021-03-07 (×2): qty 3

## 2021-03-07 MED ORDER — PREDNISONE 10 MG PO TABS: ORAL_TABLET | ORAL | 0 refills | Status: DC

## 2021-03-07 NOTE — Care Management Important Message (Signed)
Important Message  Patient Details  Name: Jose [name] MRN: [id number] Date of Birth: May 11, 1955   Medicare Important Message Given:  Yes     [name] [name] 03/07/2021, 2:13 PM

## 2021-03-07 NOTE — Progress Notes (Signed)
Physical Therapy Treatment Patient Details Name: Jose [name] MRN: [id number] DOB: [birth year] Today's Date: 03/07/2021   History of Present Illness Jose [name] is a 65 y.o. male with medical history significant for coronary artery disease, antiphospholipid antibody syndrome, carotid stenosis, COPD, hypertension, type 2 diabetes mellitus and PE as well as sleep apnea, who presented to the ER with acute onset of worsening cough which has been dry and later productive of clear sputum as well as wheezing and dyspnea over the last week.     PT Comments    Pt sitting at EOB upon arrival.   Pt with questions about home set-up and living situation and guided through options for shower/tub navigation.  Pt also educated to perform exercises at home and to monitor O2 saturation levels going forward in order to prevent desaturation.  Pt agreed and would be contacting HHPT about needs at home and navigating the tub for showers due to LE difficulty.  Current discharge plans to home with HHPT remain appropriate at this time.  Pt will continue to benefit from skilled therapy in order to address deficits listed below.       Recommendations for follow up therapy are one component of a multi-disciplinary discharge planning process, led by the attending physician.  Recommendations may be updated based on patient status, additional functional criteria and insurance authorization.  Follow Up Recommendations  Home health PT     Assistance Recommended at Discharge None  Equipment Recommendations  None recommended by PT    Recommendations for Other Services       Precautions / Restrictions Precautions Precautions: Fall Restrictions Weight Bearing Restrictions: No     Mobility  Bed Mobility                    Transfers                        Ambulation/Gait                   Stairs             Wheelchair Mobility    Modified Rankin (Stroke Patients Only)        Balance                                            Cognition Arousal/Alertness: Awake/alert Behavior During Therapy: WFL for tasks assessed/performed Overall Cognitive Status: Within Functional Limits for tasks assessed                                          Exercises Other Exercises Other Exercises: Pt educated on home set-up with all questions answered, provided education on safe transfers to the tub/shower and options to utilize for tub/shower combo.  Encouraged pt to address concerns with HHPT.    General Comments        Pertinent Vitals/Pain Pain Assessment: No/denies pain    Home Living                          Prior Function            PT Goals (current goals can now be found in the care plan section) Acute Rehab PT Goals Patient  Stated Goal: go home. PT Goal Formulation: With patient Time For Goal Achievement: 03/19/21 Potential to Achieve Goals: Good Progress towards PT goals: Progressing toward goals    Frequency    Min 2X/week      PT Plan Current plan remains appropriate    Co-evaluation              AM-PAC PT "6 Clicks" Mobility   Outcome Measure  Help needed turning from your back to your side while in a flat bed without using bedrails?: A Little Help needed moving from lying on your back to sitting on the side of a flat bed without using bedrails?: A Little Help needed moving to and from a bed to a chair (including a wheelchair)?: A Little Help needed standing up from a chair using your arms (e.g., wheelchair or bedside chair)?: A Little Help needed to walk in hospital room?: A Little Help needed climbing 3-5 steps with a railing? : A Little 6 Click Score: 18    End of Session   Activity Tolerance: Patient tolerated treatment well Patient left: in bed;with call bell/phone within reach Nurse Communication: Mobility status PT Visit Diagnosis: Unsteadiness on feet (R26.81);Other  abnormalities of gait and mobility (R26.89);Muscle weakness (generalized) (M62.81);Difficulty in walking, not elsewhere classified (R26.2)     Time: 5852-7782 PT Time Calculation (min) (ACUTE ONLY): 10 min  Charges:  $Therapeutic Activity: 8-22 mins                     Nolon Bussing, PT, DPT 03/07/21, 2:18 PM    Jose [name] 03/07/2021, 2:14 PM

## 2021-03-07 NOTE — Discharge Summary (Signed)
[facility] [place] [facility]   PATIENT NAME: Jose [name]    MR#:  [id number]  DATE OF BIRTH:  05/11/1955  DATE OF ADMISSION:  03/04/2021 ADMITTING PHYSICIAN: Hannah Beat, MD  DATE OF DISCHARGE: 03/07/2021  PRIMARY CARE PHYSICIAN: [name], MD    ADMISSION DIAGNOSIS:  COPD exacerbation (HCC) [J44.1] Anticoagulated [Z79.01] Acute on chronic respiratory failure with hypoxia (HCC) [J96.21]  DISCHARGE DIAGNOSIS:  Principal Problem:   COPD exacerbation (HCC) Active Problems:   Antiphospholipid syndrome (HCC)   Type 2 diabetes mellitus with hyperlipidemia (HCC)   Essential hypertension   Obesity (BMI 30-39.9)   SECONDARY DIAGNOSIS:   Past Medical History:  Diagnosis Date   Antiphospholipid antibody syndrome (HCC)    CAD S/P percutaneous coronary angioplasty '98, '04, Feb 2017   [facility]   Carotid arterial disease (HCC) 10/2015   moderate bilateral   COPD (chronic obstructive pulmonary disease) (HCC)    Coronary artery disease    Hypertension    Non-insulin treated type 2 diabetes mellitus (HCC)    Pulmonary emboli (HCC) 2011   RBBB    Sleep apnea     HOSPITAL COURSE:   1.  COPD exacerbation.  The patient was given Solu-Medrol 40 mg twice a day during the hospital course he was on nebulizer treatments and Trelegy inhaler.  Was given empiric Rocephin and will switch over to Augmentin for few more doses upon going home.  I will give a prednisone taper upon disposition. 2.  Acute on chronic hypoxic respiratory failure.  Initially had a pulse ox of 89%.  Was up at 6 L at 1 point during the hospital course and I dialed him down to his baseline 3 L with good saturations.  Patient states that he has a trilogy machine at home. 3.  Antiphospholipid syndrome on Coumadin.  Pharmacy kept on his usual 6 mg with INR therapeutic. 4.  Essential hypertension on metoprolol 5.  GERD on PPI 6.  Type 2 diabetes mellitus with hyperlipidemia and peripheral  neuropathy.  Continue Amaryl, Crestor and Neurontin.  Sugars will likely be higher with being on steroid taper. 7.  Lower extremity edema on torsemide 8.  Obesity with a BMI of 38.53  DISCHARGE CONDITIONS:   Satisfactory  CONSULTS OBTAINED:  None  DRUG ALLERGIES:   Allergies  Allergen Reactions   Melatonin Anxiety   Ramipril     cough    DISCHARGE MEDICATIONS:   Allergies as of 03/07/2021       Reactions   Melatonin Anxiety   Ramipril    cough        Medication List     TAKE these medications    albuterol 108 (90 Base) MCG/ACT inhaler Commonly known as: VENTOLIN HFA Inhale 2 puffs into the lungs every 6 (six) hours as needed for wheezing or shortness of breath.   albuterol (5 MG/ML) 0.5% nebulizer solution Commonly known as: PROVENTIL Take 0.5 mLs (2.5 mg total) by nebulization every 6 (six) hours as needed for wheezing or shortness of breath.   amoxicillin-clavulanate 875-125 MG tablet Commonly known as: Augmentin Take 1 tablet by mouth 2 (two) times daily for 2 days.   aspirin 81 MG EC tablet Take 1 tablet (81 mg total) by mouth daily. Swallow whole. What changed:  when to take this additional instructions   DULoxetine 60 MG capsule Commonly known as: CYMBALTA Take 60 mg by mouth daily.   Fluticasone-Umeclidin-Vilant 200-62.5-25 MCG/INH Aepb Inhale 1 puff into the lungs  daily.   gabapentin 600 MG tablet Commonly known as: NEURONTIN Take 600 mg by mouth 3 (three) times daily.   glimepiride 4 MG tablet Commonly known as: AMARYL Take 4 mg by mouth 2 (two) times daily.   metoprolol tartrate 12.5 mg Tabs tablet Commonly known as: LOPRESSOR Take 12.5 mg by mouth 2 (two) times daily.   nitroGLYCERIN 0.4 MG SL tablet Commonly known as: NITROSTAT Place 0.4 mg under the tongue every 5 (five) minutes as needed for chest pain.   pantoprazole 40 MG tablet Commonly known as: PROTONIX Take 40 mg by mouth 2 (two) times daily.   predniSONE 10 MG  tablet Commonly known as: DELTASONE 4 tabs po day1,2; 3 tabs po day3; 2 tabs po day4; 1 tab po day5; 1/2 tab po day6,7   rosuvastatin 10 MG tablet Commonly known as: CRESTOR Take 10 mg by mouth every evening.   tamsulosin 0.4 MG Caps capsule Commonly known as: FLOMAX Take 0.8 mg by mouth daily.   topiramate 50 MG tablet Commonly known as: TOPAMAX Take 75 mg by mouth 2 (two) times daily.   torsemide 20 MG tablet Commonly known as: DEMADEX Take 20 mg by mouth daily.   traZODone 50 MG tablet Commonly known as: DESYREL Take 0.5 tablets (25 mg total) by mouth at bedtime as needed for sleep.   Vitamin D (Ergocalciferol) 1.25 MG (50000 UNIT) Caps capsule Commonly known as: DRISDOL Take 50,000 Units by mouth once a week.   warfarin 6 MG tablet Commonly known as: COUMADIN Take 6 mg by mouth daily at 4 PM.         DISCHARGE INSTRUCTIONS:   Follow-up PMD 5 days  If you experience worsening of your admission symptoms, develop shortness of breath, life threatening emergency, suicidal or homicidal thoughts you must seek medical attention immediately by calling 911 or calling your MD immediately  if symptoms less severe.  You Must read complete instructions/literature along with all the possible adverse reactions/side effects for all the Medicines you take and that have been prescribed to you. Take any new Medicines after you have completely understood and accept all the possible adverse reactions/side effects.   Please note  You were cared for by a hospitalist during your hospital stay. If you have any questions about your discharge medications or the care you received while you were in the hospital after you are discharged, you can call the unit and asked to speak with the hospitalist on call if the hospitalist that took care of you is not available. Once you are discharged, your primary care physician will handle any further medical issues. Please note that NO REFILLS for any  discharge medications will be authorized once you are discharged, as it is imperative that you return to your primary care physician (or establish a relationship with a primary care physician if you do not have one) for your aftercare needs so that they can reassess your need for medications and monitor your lab values.    Today   CHIEF COMPLAINT:   Chief Complaint  Patient presents with   Shortness of Breath    HISTORY OF PRESENT ILLNESS:  Jose [name]  is a 65 y.o. male came in with COPD exacerbation   VITAL SIGNS:  Blood pressure (!) 141/81, pulse 64, temperature 98.6 F (37 C), resp. rate 20, height  (1.702 m), weight 111.6 kg, SpO2 94 %.  I/O:   Intake/Output Summary (Last 24 hours) at 03/07/2021 1724 Last data filed at 03/07/2021  1300 Gross per 24 hour  Intake 680 ml  Output 4200 ml  Net -3520 ml    PHYSICAL EXAMINATION:  GENERAL:  65 y.o.-year-old patient lying in the bed with no acute distress.  EYES: Pupils equal, round, reactive to light and accommodation. No scleral icterus.  HEENT: Head atraumatic, normocephalic. Oropharynx and nasopharynx clear.  LUNGS: decreased breath sounds bilaterally, no wheezing, rales,rhonchi or crepitation. No use of accessory muscles of respiration.  CARDIOVASCULAR: S1, S2 normal. No murmurs, rubs, or gallops.  ABDOMEN: Soft, non-tender, non-distended.  EXTREMITIES: trace pedal edema.  NEUROLOGIC: Cranial nerves II through XII are intact. Muscle strength 5/5 in all extremities. Sensation intact. Gait not checked.  PSYCHIATRIC: The patient is alert and oriented x 3.  SKIN: No obvious rash, lesion, or ulcer.   DATA REVIEW:   CBC Recent Labs  Lab 03/05/21 0654  WBC 6.2  HGB 13.0  HCT 41.9  PLT 218    Chemistries  Recent Labs  Lab 03/04/21 1947 03/05/21 0654  NA 136 139  K 3.6 4.8  CL 96* 100  CO2 34* 34*  GLUCOSE 120* 245*  BUN 18 16  CREATININE 0.81 0.76  CALCIUM 8.7* 8.8*  AST 19  --   ALT 23  --    ALKPHOS 69  --   BILITOT 0.5  --      Microbiology Results  Results for orders placed or performed during the hospital encounter of 03/04/21  Resp Panel by RT-PCR (Flu A&B, Covid) Nasopharyngeal Swab     Status: None   Collection Time: 03/04/21  8:06 PM   Specimen: Nasopharyngeal Swab; Nasopharyngeal(NP) swabs in vial transport medium  Result Value Ref Range Status   SARS Coronavirus 2 by RT PCR NEGATIVE NEGATIVE Final    Comment: (NOTE) SARS-CoV-2 target nucleic acids are NOT DETECTED.  The SARS-CoV-2 RNA is generally detectable in upper respiratory specimens during the acute phase of infection. The lowest concentration of SARS-CoV-2 viral copies this assay can detect is 138 copies/mL. A negative result does not preclude SARS-Cov-2 infection and should not be used as the sole basis for treatment or other patient management decisions. A negative result may occur with  improper specimen collection/handling, submission of specimen other than nasopharyngeal swab, presence of viral mutation(s) within the areas targeted by this assay, and inadequate number of viral copies(<138 copies/mL). A negative result must be combined with clinical observations, patient history, and epidemiological information. The expected result is Negative.  Fact Sheet for Patients:  BloggerCourse.com  Fact Sheet for Healthcare Providers:  SeriousBroker.it  This test is no t yet approved or cleared by the [place] FDA and  has been authorized for detection and/or diagnosis of SARS-CoV-2 by FDA under an Emergency Use Authorization (EUA). This EUA will remain  in effect (meaning this test can be used) for the duration of the COVID-19 declaration under Section 564(b)(1) of the Act, 21 U.S.C.section 360bbb-3(b)(1), unless the authorization is terminated  or revoked sooner.       Influenza A by PCR NEGATIVE NEGATIVE Final   Influenza B by PCR NEGATIVE  NEGATIVE Final    Comment: (NOTE) The Xpert Xpress SARS-CoV-2/FLU/RSV plus assay is intended as an aid in the diagnosis of influenza from Nasopharyngeal swab specimens and should not be used as a sole basis for treatment. Nasal washings and aspirates are unacceptable for Xpert Xpress SARS-CoV-2/FLU/RSV testing.  Fact Sheet for Patients: BloggerCourse.com  Fact Sheet for Healthcare Providers: SeriousBroker.it  This test is not yet approved or cleared  by the [place] and has been authorized for detection and/or diagnosis of SARS-CoV-2 by FDA under an Emergency Use Authorization (EUA). This EUA will remain in effect (meaning this test can be used) for the duration of the COVID-19 declaration under Section 564(b)(1) of the Act, 21 U.S.C. section 360bbb-3(b)(1), unless the authorization is terminated or revoked.  Performed at [facility], [address] [place], [place] [address]       Management plans discussed with the patient, family and they are in agreement.  CODE STATUS:     Code Status Orders  (From admission, onward)           Start     Ordered   03/05/21 0018  Do not attempt resuscitation (DNR)  Continuous       Question Answer Comment  In the event of cardiac or respiratory ARREST Do not call a code blue   In the event of cardiac or respiratory ARREST Do not perform Intubation, CPR, defibrillation or ACLS   In the event of cardiac or respiratory ARREST Use medication by any route, position, wound care, and other measures to relive pain and suffering. May use oxygen, suction and manual treatment of airway obstruction as needed for comfort.      03/05/21 0022           Code Status History     Date Active Date Inactive Code Status Order ID Comments User Context   07/13/2020 2123 07/20/2020 1255 DNR [id number]  Therisa Doyne, MD ED   01/18/2020 1911 01/23/2020 0040 Full Code [id number]   Eliezer Bottom, MD ED   03/14/2019 1701 03/22/2019 2328 Full Code [id number]  Joseph Art, DO ED   02/11/2017 0034 02/12/2017 1846 Full Code [id number]  Hillary Bow, DO ED   08/16/2016 0719 08/17/2016 1644 Full Code [id number]  Marcos Eke, PA-C ED       TOTAL TIME TAKING CARE OF THIS PATIENT: 37 minutes.    Alford Highland M.D on 03/07/2021 at 5:24 PM   [facility]  CC: Primary care physician; [name], MD

## 2021-03-07 NOTE — Progress Notes (Signed)
Saline removed-discharge instructions reviewed with patient-waiting on wife for transport.

## 2021-03-07 NOTE — Consult Note (Signed)
ANTICOAGULATION CONSULT NOTE - Initial Consult  Pharmacy Consult for warfarin Indication:  antiphospholipid syndrome  Allergies  Allergen Reactions   Melatonin Anxiety   Ramipril     cough    Patient Measurements: Height: 5\' 7"  (170.2 cm) Weight: 111.6 kg (246 lb 0.5 oz) IBW/kg (Calculated) : 66.1  Vital Signs: Temp: 97.4 F (36.3 C) (12/23 0541) Temp Source: Oral (12/23 0541) BP: 149/69 (12/23 0541) Pulse Rate: 69 (12/23 0541)  Labs: Recent Labs    03/04/21 1947 03/05/21 0056 03/05/21 0654 03/05/21 1039 03/06/21 0437 03/07/21 0605  HGB 12.7*  --  13.0  --   --   --   HCT 40.6  --  41.9  --   --   --   PLT 212  --  218  --   --   --   LABPROT 29.5*  --   --  27.7* 28.7* 27.7*  INR 2.8*  --   --  2.6* 2.7* 2.6*  CREATININE 0.81  --  0.76  --   --   --   TROPONINIHS 9 9  --   --   --   --      Estimated Creatinine Clearance: 109.8 mL/min (by C-G formula based on SCr of 0.76 mg/dL).   Medical History: Past Medical History:  Diagnosis Date   Antiphospholipid antibody syndrome (HCC)    CAD S/P percutaneous coronary angioplasty '98, '04, Feb 2017   [facility]   Carotid arterial disease (HCC) 10/2015   moderate bilateral   COPD (chronic obstructive pulmonary disease) (HCC)    Coronary artery disease    Hypertension    Non-insulin treated type 2 diabetes mellitus (HCC)    Pulmonary emboli (HCC) 2011   RBBB    Sleep apnea     Medications:  Medications Prior to Admission  Medication Sig Dispense Refill Last Dose   albuterol (VENTOLIN HFA) 108 (90 Base) MCG/ACT inhaler Inhale 2 puffs into the lungs every 6 (six) hours as needed for wheezing or shortness of breath.   prn at prn   aspirin EC 81 MG EC tablet Take 1 tablet (81 mg total) by mouth daily. Swallow whole. (Patient taking differently: Take 81 mg by mouth at bedtime.) 30 tablet 11 03/03/2021   DULoxetine (CYMBALTA) 60 MG capsule Take 60 mg by mouth daily.   03/03/2021   Fluticasone-Umeclidin-Vilant  200-62.5-25 MCG/INH AEPB Inhale 1 puff into the lungs daily.   03/04/2021   gabapentin (NEURONTIN) 600 MG tablet Take 600 mg by mouth 3 (three) times daily.   03/04/2021   glimepiride (AMARYL) 4 MG tablet Take 4 mg by mouth 2 (two) times daily.   03/04/2021   metoprolol tartrate (LOPRESSOR) 12.5 mg TABS tablet Take 12.5 mg by mouth 2 (two) times daily.   03/04/2021   pantoprazole (PROTONIX) 40 MG tablet Take 40 mg by mouth 2 (two) times daily.   03/04/2021   rosuvastatin (CRESTOR) 10 MG tablet Take 10 mg by mouth every evening.  11 03/03/2021   tamsulosin (FLOMAX) 0.4 MG CAPS capsule Take 0.8 mg by mouth daily.  0 03/04/2021   topiramate (TOPAMAX) 50 MG tablet Take 75 mg by mouth 2 (two) times daily.  0 03/04/2021   torsemide (DEMADEX) 20 MG tablet Take 20 mg by mouth daily.   03/04/2021   Vitamin D, Ergocalciferol, (DRISDOL) 1.25 MG (50000 UNIT) CAPS capsule Take 50,000 Units by mouth once a week.   Past Week   warfarin (COUMADIN) 6 MG tablet Take 6 mg  by mouth daily at 4 PM.   03/03/2021   albuterol (PROVENTIL) (5 MG/ML) 0.5% nebulizer solution Take 0.5 mLs (2.5 mg total) by nebulization every 6 (six) hours as needed for wheezing or shortness of breath. 20 mL 12 prn at prn   alfuzosin (UROXATRAL) 10 MG 24 hr tablet Take 10 mg by mouth daily. (Patient not taking: Reported on 03/04/2021)   Not Taking   nitroGLYCERIN (NITROSTAT) 0.4 MG SL tablet Place 0.4 mg under the tongue every 5 (five) minutes as needed for chest pain.   1 prn at prn   oxybutynin (DITROPAN) 5 MG tablet Take 5 mg by mouth 3 (three) times daily as needed. (Patient not taking: Reported on 03/04/2021)   Not Taking   OZEMPIC, 1 MG/DOSE, 4 MG/3ML SOPN Inject 1 mg into the skin every Monday. (Patient not taking: Reported on 03/04/2021)   Not Taking    Assessment: PMH significant for CAD, antiphospholipid antibody syndrome, carotid stenosis, COPD, HTN, DM and hx of PE. Admitted for COPD exacerbation. Pharmacy consulted to manage  warfarin while inpatient.  Last INR prior to admission 3.7 (per patients report), warfarin dose decreased to 6mg  daily by PCP on 02/28/2021  New DDI with warfarin: ceftriaxone and solu-medrol (moderate)  Goal of Therapy:  INR 2.5-3 Monitor platelets by anticoagulation protocol: Yes   Date INR Comment 12/20 2.8 Admitted - no warfarin given 12/21 2.6 Warfarin 7.5mg  today 12/22 2.7 Warfarin 6mg  daily 12/23 2.6 Warfarin 6 mg daily   Plan:  INR remains therapeutic Cotinue warfarin home dose of 6mg  daily INR tomorrow with AM labs  , PharmD, BCPS Clinical Pharmacist   03/07/2021 7:18 AM

## 2021-03-18 DIAGNOSIS — J449 Chronic obstructive pulmonary disease, unspecified: Secondary | ICD-10-CM | POA: Diagnosis not present

## 2021-03-18 DIAGNOSIS — I2699 Other pulmonary embolism without acute cor pulmonale: Secondary | ICD-10-CM | POA: Diagnosis not present

## 2021-03-18 DIAGNOSIS — I5032 Chronic diastolic (congestive) heart failure: Secondary | ICD-10-CM | POA: Diagnosis not present

## 2021-03-18 DIAGNOSIS — Z7901 Long term (current) use of anticoagulants: Secondary | ICD-10-CM | POA: Diagnosis not present

## 2021-03-18 DIAGNOSIS — I10 Essential (primary) hypertension: Secondary | ICD-10-CM | POA: Diagnosis not present

## 2021-03-18 DIAGNOSIS — E1140 Type 2 diabetes mellitus with diabetic neuropathy, unspecified: Secondary | ICD-10-CM | POA: Diagnosis not present

## 2021-04-15 DIAGNOSIS — R5383 Other fatigue: Secondary | ICD-10-CM | POA: Diagnosis not present

## 2021-04-15 DIAGNOSIS — R569 Unspecified convulsions: Secondary | ICD-10-CM | POA: Diagnosis not present

## 2021-04-15 DIAGNOSIS — H53123 Transient visual loss, bilateral: Secondary | ICD-10-CM | POA: Diagnosis not present

## 2021-04-15 DIAGNOSIS — R55 Syncope and collapse: Secondary | ICD-10-CM | POA: Diagnosis not present

## 2021-04-16 DIAGNOSIS — E041 Nontoxic single thyroid nodule: Secondary | ICD-10-CM | POA: Diagnosis not present

## 2021-04-16 DIAGNOSIS — I10 Essential (primary) hypertension: Secondary | ICD-10-CM | POA: Diagnosis not present

## 2021-04-16 DIAGNOSIS — I2510 Atherosclerotic heart disease of native coronary artery without angina pectoris: Secondary | ICD-10-CM | POA: Diagnosis not present

## 2021-04-16 DIAGNOSIS — Z6838 Body mass index (BMI) 38.0-38.9, adult: Secondary | ICD-10-CM | POA: Diagnosis not present

## 2021-04-16 DIAGNOSIS — E1159 Type 2 diabetes mellitus with other circulatory complications: Secondary | ICD-10-CM | POA: Diagnosis not present

## 2021-04-18 DIAGNOSIS — Z7901 Long term (current) use of anticoagulants: Secondary | ICD-10-CM | POA: Diagnosis not present

## 2021-04-18 DIAGNOSIS — D6861 Antiphospholipid syndrome: Secondary | ICD-10-CM | POA: Diagnosis not present

## 2021-05-16 DIAGNOSIS — R569 Unspecified convulsions: Secondary | ICD-10-CM | POA: Diagnosis not present

## 2021-05-20 DIAGNOSIS — R5383 Other fatigue: Secondary | ICD-10-CM | POA: Diagnosis not present

## 2021-05-20 DIAGNOSIS — R569 Unspecified convulsions: Secondary | ICD-10-CM | POA: Diagnosis not present

## 2021-05-20 DIAGNOSIS — R55 Syncope and collapse: Secondary | ICD-10-CM | POA: Diagnosis not present

## 2021-05-20 DIAGNOSIS — M542 Cervicalgia: Secondary | ICD-10-CM | POA: Diagnosis not present

## 2021-05-30 DIAGNOSIS — E1140 Type 2 diabetes mellitus with diabetic neuropathy, unspecified: Secondary | ICD-10-CM | POA: Diagnosis not present

## 2021-05-30 DIAGNOSIS — Z7901 Long term (current) use of anticoagulants: Secondary | ICD-10-CM | POA: Diagnosis not present

## 2021-05-30 DIAGNOSIS — R202 Paresthesia of skin: Secondary | ICD-10-CM | POA: Diagnosis not present

## 2021-05-30 DIAGNOSIS — R55 Syncope and collapse: Secondary | ICD-10-CM | POA: Diagnosis not present

## 2021-05-30 DIAGNOSIS — D6861 Antiphospholipid syndrome: Secondary | ICD-10-CM | POA: Diagnosis not present

## 2021-05-30 DIAGNOSIS — E041 Nontoxic single thyroid nodule: Secondary | ICD-10-CM | POA: Diagnosis not present

## 2021-06-05 ENCOUNTER — Other Ambulatory Visit (HOSPITAL_COMMUNITY): Payer: Self-pay | Admitting: Advanced Practice Midwife

## 2021-06-05 ENCOUNTER — Other Ambulatory Visit: Payer: Self-pay | Admitting: Advanced Practice Midwife

## 2021-06-05 DIAGNOSIS — H53123 Transient visual loss, bilateral: Secondary | ICD-10-CM

## 2021-06-05 DIAGNOSIS — R5383 Other fatigue: Secondary | ICD-10-CM

## 2021-06-05 DIAGNOSIS — R55 Syncope and collapse: Secondary | ICD-10-CM

## 2021-06-05 DIAGNOSIS — R569 Unspecified convulsions: Secondary | ICD-10-CM

## 2021-06-12 DIAGNOSIS — E041 Nontoxic single thyroid nodule: Secondary | ICD-10-CM | POA: Diagnosis not present

## 2021-06-17 ENCOUNTER — Ambulatory Visit
Admission: RE | Admit: 2021-06-17 | Discharge: 2021-06-17 | Disposition: A | Discharge status: Home / Self Care | Payer: Medicare Other | Source: Ambulatory Visit | Attending: Internal Medicine | Admitting: Internal Medicine

## 2021-06-17 DIAGNOSIS — H53123 Transient visual loss, bilateral: Secondary | ICD-10-CM

## 2021-06-17 DIAGNOSIS — R296 Repeated falls: Secondary | ICD-10-CM | POA: Diagnosis not present

## 2021-06-17 DIAGNOSIS — I6502 Occlusion and stenosis of left vertebral artery: Secondary | ICD-10-CM | POA: Diagnosis not present

## 2021-06-17 DIAGNOSIS — I6501 Occlusion and stenosis of right vertebral artery: Secondary | ICD-10-CM | POA: Diagnosis not present

## 2021-06-17 DIAGNOSIS — R55 Syncope and collapse: Secondary | ICD-10-CM | POA: Diagnosis not present

## 2021-06-17 DIAGNOSIS — R42 Dizziness and giddiness: Secondary | ICD-10-CM | POA: Diagnosis not present

## 2021-06-19 ENCOUNTER — Ambulatory Visit (INDEPENDENT_AMBULATORY_CARE_PROVIDER_SITE_OTHER): Payer: Medicare Other | Admitting: Nurse Practitioner

## 2021-06-19 ENCOUNTER — Encounter (INDEPENDENT_AMBULATORY_CARE_PROVIDER_SITE_OTHER): Payer: Self-pay | Admitting: Nurse Practitioner

## 2021-06-19 VITALS — BP 108/70 | HR 98 | Resp 16 | Ht 64.0 in | Wt 258.8 lb

## 2021-06-19 DIAGNOSIS — E782 Mixed hyperlipidemia: Secondary | ICD-10-CM | POA: Diagnosis not present

## 2021-06-19 DIAGNOSIS — I6523 Occlusion and stenosis of bilateral carotid arteries: Secondary | ICD-10-CM | POA: Diagnosis not present

## 2021-06-19 DIAGNOSIS — I10 Essential (primary) hypertension: Secondary | ICD-10-CM

## 2021-06-24 DIAGNOSIS — R55 Syncope and collapse: Secondary | ICD-10-CM | POA: Diagnosis not present

## 2021-06-24 DIAGNOSIS — J9610 Chronic respiratory failure, unspecified whether with hypoxia or hypercapnia: Secondary | ICD-10-CM | POA: Diagnosis not present

## 2021-06-24 DIAGNOSIS — J449 Chronic obstructive pulmonary disease, unspecified: Secondary | ICD-10-CM | POA: Diagnosis not present

## 2021-06-30 ENCOUNTER — Encounter (INDEPENDENT_AMBULATORY_CARE_PROVIDER_SITE_OTHER): Payer: Self-pay | Admitting: Nurse Practitioner

## 2021-06-30 NOTE — Progress Notes (Signed)
? ?Subjective:  ? ? Patient ID: Jose [name], male    DOB: April 29, 1955, 66 y.o.   MRN: [id number] ?Chief Complaint  ?Patient presents with  ? New Patient (Initial Visit)  ?  Ref Manuella Ghazi consult for carotid artery stenosis  ? ? ?Jose [name] is a 66 year old male presents today as a referral from Dr. Manuella Ghazi in regards to carotid artery stenosis.  The patient presents with a long history of syncopal events that did not have a clinically diagnosed cause.  The patient notes that he was diagnosed with having vertebrobasilar insufficiency years ago and was taking Topamax 75 mg per mouth 3 times daily and this was helpful for approximately 10 to 15 years.  However recently has begun to have worsening vertigo-like symptoms of syncope as well as worsening memory issues.  He has had a multitude of tests throughout the years to discover the cause of the syncopal events and vertigo however there have been no conclusive diagnoses. ? ? ?Review of Systems  ?Neurological:  Positive for syncope.  ?All other systems reviewed and are negative. ? ?   ?Objective:  ? Physical Exam ?Vitals reviewed.  ?HENT:  ?   Head: Normocephalic.  ?Cardiovascular:  ?   Rate and Rhythm: Normal rate.  ?   Pulses: Normal pulses.  ?Pulmonary:  ?   Effort: Pulmonary effort is normal.  ?Skin: ?   General: Skin is warm and dry.  ?Neurological:  ?   Mental Status: He is alert and oriented to person, place, and time.  ?Psychiatric:     ?   Mood and Affect: Mood normal.     ?   Behavior: Behavior normal.     ?   Thought Content: Thought content normal.     ?   Judgment: Judgment normal.  ? ? ?BP 108/70 (BP Location: Left Arm)   Pulse 98   Resp 16   Ht 5\' 4"  (1.626 m)   Wt 258 lb 12.8 oz (117.4 kg)   BMI 44.42 kg/m?  ? ?Past Medical History:  ?Diagnosis Date  ? Antiphospholipid antibody syndrome (HCC)   ? CAD S/P percutaneous coronary angioplasty '98, '04, Feb 2017  ? [facility]  ? Carotid arterial disease ([place]) 10/2015  ? moderate bilateral  ? COPD (chronic obstructive  pulmonary disease) ([place])   ? Coronary artery disease   ? Hypertension   ? Non-insulin treated type 2 diabetes mellitus ([place])   ? Pulmonary emboli ([place]) 2011  ? RBBB   ? Sleep apnea   ? ? ?Social History  ? ?Socioeconomic History  ? Marital status: Married  ?  Spouse name: Not on file  ? Number of children: Not on file  ? Years of education: Not on file  ? Highest education level: Not on file  ?Occupational History  ? Not on file  ?Tobacco Use  ? Smoking status: Former  ?  Packs/day: 0.50  ?  Years: 40.00  ?  Pack years: 20.00  ?  Types: Cigarettes  ?  Quit date: 01/15/2019  ?  Years since quitting: 2.4  ? Smokeless tobacco: Never  ? Tobacco comments:  ?  has started smoking again, willing to quit.  ?Vaping Use  ? Vaping Use: Never used  ?Substance and Sexual Activity  ? Alcohol use: No  ? Drug use: No  ? Sexual activity: Yes  ?Other Topics Concern  ? Not on file  ?Social History Narrative  ? Not on file  ? ?Social Determinants of Health  ? ?  Financial Resource Strain: Not on file  ?Food Insecurity: Not on file  ?Transportation Needs: Not on file  ?Physical Activity: Not on file  ?Stress: Not on file  ?Social Connections: Not on file  ?Intimate Partner Violence: Not on file  ? ? ?Past Surgical History:  ?Procedure Laterality Date  ? APPENDECTOMY    ? APPLICATION OF WOUND VAC Left 03/18/2019  ? Procedure: Application Of Wound Vac;  Surgeon: Nadara Mustard, MD;  Location: [facility] OR;  Service: Orthopedics;  Laterality: Left;  ? BACK SURGERY  '89, '06  ? COLONOSCOPY    ? CORONARY ANGIOPLASTY WITH STENT PLACEMENT  '98, '04, Feb 2017  ? I & D EXTREMITY Left 03/15/2019  ? Procedure: DEBRIDEMENT LEFT THIGH;  Surgeon: Nadara Mustard, MD;  Location: [facility] OR;  Service: Orthopedics;  Laterality: Left;  ? INCISION AND DRAINAGE OF WOUND Left 03/18/2019  ? Procedure: LEFT THIGH DEBRIDEMENT;  Surgeon: Nadara Mustard, MD;  Location: [facility] OR;  Service: Orthopedics;  Laterality: Left;  ? ? ?Family History  ?Problem Relation Age of Onset  ? CAD  Father 5  ? ? ?Allergies  ?Allergen Reactions  ? Melatonin Anxiety  ? Ramipril   ?  cough  ? ? ? ?  Latest Ref Rng & Units 03/05/2021  ?  6:54 AM 03/04/2021  ?  7:47 PM 07/19/2020  ?  4:21 AM  ?CBC  ?WBC 4.0 - 10.5 K/uL 6.2   10.3   13.5    ?Hemoglobin 13.0 - 17.0 g/dL 49.7   02.6   37.8    ?Hematocrit 39.0 - 52.0 % 41.9   40.6   43.7    ?Platelets 150 - 400 K/uL 218   212   297    ? ? ? ? ?CMP  ?   ?Component Value Date/Time  ? NA 139 03/05/2021 0654  ? K 4.8 03/05/2021 0654  ? CL 100 03/05/2021 0654  ? CO2 34 (H) 03/05/2021 0654  ? GLUCOSE 245 (H) 03/05/2021 0654  ? BUN 16 03/05/2021 0654  ? CREATININE 0.76 03/05/2021 0654  ? CALCIUM 8.8 (L) 03/05/2021 0654  ? PROT 7.5 03/04/2021 1947  ? ALBUMIN 3.6 03/04/2021 1947  ? AST 19 03/04/2021 1947  ? ALT 23 03/04/2021 1947  ? ALKPHOS 69 03/04/2021 1947  ? BILITOT 0.5 03/04/2021 1947  ? GFRNONAA >60 03/05/2021 0654  ? GFRAA >60 03/22/2019 1504  ? ? ? ?No results found. ? ?   ?Assessment & Plan:  ? ?1. Bilateral carotid artery stenosis ?The patient has had multiple evaluations of his carotid and vertebral arteries, with the concern that some of his symptoms may be related to vertebrobasilar insufficiency.  Typically as a cause of vertebrobasilar insufficiency as a result of stenosis from the vertebral arteries typically requires that there is a greater than 70% stenosis from a noted dominant vertebral artery.  There should also be some degree of stenosis in the remaining vertebral as well. ? ?01/31/2020-carotid ultrasound, which noted no significant stenosis in the right ICA or left.  Antegrade flow in the bilateral vertebral arteries. ? ?02/01/2020-MRA, the expected flow signal is demonstrated within the imaged distal vertebral arteries, which are codominant, the basilar artery and bilateral posterior cerebral arteries ? ?12/06/2020-carotid ultrasound, right ICA stenosis suggest 50 to 69%, with less than 50% of the left ICA.  Antegrade flow of both vertebral  arteries ? ?0404/2023-MRA, no evidence of carotid stenosis noted bilaterally, with mild to moderate atherosclerosis of the left vertebral artery but  no significant stenosis of the right vertebral artery. ? ?Based upon this review of studies, the vertebral arteries have not demonstrated significant stenosis to account for the cause of the patient's syncopal events.  The gold standard test for vertebrobasilar insufficiency is a tilt table test.  To finally determine whether the patient has vertebrobasilar insufficiency versus another cause of his syncopal episodes, a tilt table test would be beneficial.  He will continue to follow-up with neurology and cardiology for possible causes of his syncope.  Otherwise we can follow the patient on an annual basis for evaluation of carotid arteries. ? ?2. Essential hypertension, benign ?Continue antihypertensive medications as already ordered, these medications have been reviewed and there are no changes at this time.  ? ?3. Mixed hyperlipidemia ?Continue statin as ordered and reviewed, no changes at this time  ? ? ?Current Outpatient Medications on File Prior to Visit  ?Medication Sig Dispense Refill  ? albuterol (PROVENTIL) (5 MG/ML) 0.5% nebulizer solution Take 0.5 mLs (2.5 mg total) by nebulization every 6 (six) hours as needed for wheezing or shortness of breath. 20 mL 12  ? albuterol (VENTOLIN HFA) 108 (90 Base) MCG/ACT inhaler Inhale 2 puffs into the lungs every 6 (six) hours as needed for wheezing or shortness of breath.    ? DULoxetine (CYMBALTA) 60 MG capsule Take 60 mg by mouth daily.    ? gabapentin (NEURONTIN) 600 MG tablet Take 600 mg by mouth 3 (three) times daily.    ? glimepiride (AMARYL) 4 MG tablet Take 4 mg by mouth 2 (two) times daily.    ? ipratropium-albuterol (DUONEB) 0.5-2.5 (3) MG/3ML SOLN Take 3 mLs by nebulization every 4 (four) hours as needed.    ? metoprolol tartrate (LOPRESSOR) 12.5 mg TABS tablet Take 12.5 mg by mouth 2 (two) times daily.    ?  nitroGLYCERIN (NITROSTAT) 0.4 MG SL tablet Place 0.4 mg under the tongue every 5 (five) minutes as needed for chest pain.   1  ? oxybutynin (DITROPAN) 5 MG tablet Take by mouth.    ? pantoprazole (PROTONIX) 40 MG tablet Take

## 2021-07-07 ENCOUNTER — Emergency Department (HOSPITAL_COMMUNITY): Payer: Medicare Other

## 2021-07-07 ENCOUNTER — Encounter (HOSPITAL_COMMUNITY): Payer: Self-pay | Admitting: Emergency Medicine

## 2021-07-07 ENCOUNTER — Emergency Department (HOSPITAL_COMMUNITY)
Admission: EM | Admit: 2021-07-07 | Discharge: 2021-07-07 | Disposition: A | Discharge status: Home / Self Care | Payer: Medicare Other | Attending: Emergency Medicine | Admitting: Emergency Medicine

## 2021-07-07 ENCOUNTER — Other Ambulatory Visit: Payer: Self-pay

## 2021-07-07 DIAGNOSIS — Z7982 Long term (current) use of aspirin: Secondary | ICD-10-CM | POA: Insufficient documentation

## 2021-07-07 DIAGNOSIS — J441 Chronic obstructive pulmonary disease with (acute) exacerbation: Secondary | ICD-10-CM | POA: Diagnosis not present

## 2021-07-07 DIAGNOSIS — S60512A Abrasion of left hand, initial encounter: Secondary | ICD-10-CM | POA: Diagnosis not present

## 2021-07-07 DIAGNOSIS — S0990XA Unspecified injury of head, initial encounter: Secondary | ICD-10-CM | POA: Insufficient documentation

## 2021-07-07 DIAGNOSIS — Z7951 Long term (current) use of inhaled steroids: Secondary | ICD-10-CM | POA: Diagnosis not present

## 2021-07-07 DIAGNOSIS — Z7901 Long term (current) use of anticoagulants: Secondary | ICD-10-CM | POA: Diagnosis not present

## 2021-07-07 DIAGNOSIS — W01198A Fall on same level from slipping, tripping and stumbling with subsequent striking against other object, initial encounter: Secondary | ICD-10-CM | POA: Insufficient documentation

## 2021-07-07 DIAGNOSIS — R55 Syncope and collapse: Secondary | ICD-10-CM

## 2021-07-07 DIAGNOSIS — S6992XA Unspecified injury of left wrist, hand and finger(s), initial encounter: Secondary | ICD-10-CM | POA: Diagnosis present

## 2021-07-07 DIAGNOSIS — S199XXA Unspecified injury of neck, initial encounter: Secondary | ICD-10-CM | POA: Diagnosis not present

## 2021-07-07 DIAGNOSIS — Z743 Need for continuous supervision: Secondary | ICD-10-CM | POA: Diagnosis not present

## 2021-07-07 DIAGNOSIS — W19XXXA Unspecified fall, initial encounter: Secondary | ICD-10-CM | POA: Diagnosis not present

## 2021-07-07 DIAGNOSIS — I10 Essential (primary) hypertension: Secondary | ICD-10-CM | POA: Diagnosis not present

## 2021-07-07 DIAGNOSIS — I6529 Occlusion and stenosis of unspecified carotid artery: Secondary | ICD-10-CM | POA: Diagnosis not present

## 2021-07-07 DIAGNOSIS — S60222A Contusion of left hand, initial encounter: Secondary | ICD-10-CM | POA: Insufficient documentation

## 2021-07-07 DIAGNOSIS — E119 Type 2 diabetes mellitus without complications: Secondary | ICD-10-CM | POA: Insufficient documentation

## 2021-07-07 DIAGNOSIS — Z79899 Other long term (current) drug therapy: Secondary | ICD-10-CM | POA: Diagnosis not present

## 2021-07-07 DIAGNOSIS — M47812 Spondylosis without myelopathy or radiculopathy, cervical region: Secondary | ICD-10-CM | POA: Diagnosis not present

## 2021-07-07 DIAGNOSIS — G4489 Other headache syndrome: Secondary | ICD-10-CM | POA: Diagnosis not present

## 2021-07-07 LAB — CBC WITH DIFFERENTIAL/PLATELET
Abs Immature Granulocytes: 0.05 10*3/uL (ref 0.00–0.07)
Basophils Absolute: 0.1 10*3/uL (ref 0.0–0.1)
Basophils Relative: 1 %
Eosinophils Absolute: 0.1 10*3/uL (ref 0.0–0.5)
Eosinophils Relative: 1 %
HCT: 39.5 % (ref 39.0–52.0)
Hemoglobin: 12 g/dL — ABNORMAL LOW (ref 13.0–17.0)
Immature Granulocytes: 0 %
Lymphocytes Relative: 17 %
Lymphs Abs: 2.1 10*3/uL (ref 0.7–4.0)
MCH: 30.3 pg (ref 26.0–34.0)
MCHC: 30.4 g/dL (ref 30.0–36.0)
MCV: 99.7 fL (ref 80.0–100.0)
Monocytes Absolute: 1 10*3/uL (ref 0.1–1.0)
Monocytes Relative: 9 %
Neutro Abs: 8.6 10*3/uL — ABNORMAL HIGH (ref 1.7–7.7)
Neutrophils Relative %: 72 %
Platelets: 227 10*3/uL (ref 150–400)
RBC: 3.96 MIL/uL — ABNORMAL LOW (ref 4.22–5.81)
RDW: 14 % (ref 11.5–15.5)
WBC: 11.8 10*3/uL — ABNORMAL HIGH (ref 4.0–10.5)
nRBC: 0 % (ref 0.0–0.2)

## 2021-07-07 LAB — BASIC METABOLIC PANEL
Anion gap: 9 (ref 5–15)
BUN: 9 mg/dL (ref 8–23)
CO2: 28 mmol/L (ref 22–32)
Calcium: 8.5 mg/dL — ABNORMAL LOW (ref 8.9–10.3)
Chloride: 100 mmol/L (ref 98–111)
Creatinine, Ser: 0.75 mg/dL (ref 0.61–1.24)
GFR, Estimated: >60 mL/min (ref >60)
Glucose, Bld: 118 mg/dL — ABNORMAL HIGH (ref 70–99)
Potassium: 4.1 mmol/L (ref 3.5–5.1)
Sodium: 137 mmol/L (ref 135–145)

## 2021-07-07 LAB — PROTIME-INR
INR: 1.9 — ABNORMAL HIGH (ref 0.8–1.2)
Prothrombin Time: 21.9 seconds — ABNORMAL HIGH (ref 11.4–15.2)

## 2021-07-07 NOTE — ED Provider Notes (Signed)
?[facility] EMERGENCY DEPARTMENT ?Provider Note ? ? ?CSN: [id number] ?Arrival date & time: 07/07/21  1843 ? ?  ? ?History ? ?Chief Complaint  ?Patient presents with  ? Fall  ?  On Thinner  ? ? ?Jose [name] is a 66 y.o. male. ? ?HPI ?Patient arrived by EMS for for evaluation of injury to head, after syncopal episode, causing him to fall and hit it.  He presented as a level 2 trauma because he takes anticoagulants, warfarin, for chronic disease.  He states he is being worked up for syncope and has had multiple testing done.  He states that today he had a typical syncopal episode, noted when he feels "wobbly legs."  He apparently fell with syncope and hit his head on the back.  He states his syncope lasted just a few seconds, and his wife was with him during this time.  He thinks he injured his left hand when he fell because there is blood on it. ? ?Patient most recently had large vessel MRA of the neck and head that did not show flow-limiting lesions that were significant; for June 17, 2021.  This was done by vascular surgery.  Plans being made for tilt table testing.  He has also seen neurology and had a prolonged video EEG which was apparently negative.  Last neurology evaluation 06/04/2021 ?  ? ?Home Medications ?Prior to Admission medications   ?Medication Sig Start Date End Date Taking? Authorizing Provider  ?albuterol (PROVENTIL) (5 MG/ML) 0.5% nebulizer solution Take 0.5 mLs (2.5 mg total) by nebulization every 6 (six) hours as needed for wheezing or shortness of breath. 12/27/18   Terald Sleeper, MD  ?albuterol (VENTOLIN HFA) 108 (90 Base) MCG/ACT inhaler Inhale 2 puffs into the lungs every 6 (six) hours as needed for wheezing or shortness of breath.    [provider]  ?aspirin EC 81 MG EC tablet Take 1 tablet (81 mg total) by mouth daily. Swallow whole. ?Patient not taking: Reported on 06/19/2021 01/23/20   Dagar, Geralynn Rile, MD  ?DULoxetine (CYMBALTA) 60 MG capsule Take 60 mg by mouth  daily.    [provider]  ?Fluticasone-Umeclidin-Vilant 200-62.5-25 MCG/INH AEPB Inhale 1 puff into the lungs daily. ?Patient not taking: Reported on 06/19/2021    [provider]  ?gabapentin (NEURONTIN) 600 MG tablet Take 600 mg by mouth 3 (three) times daily.    [provider]  ?glimepiride (AMARYL) 4 MG tablet Take 4 mg by mouth 2 (two) times daily. 12/04/20   [provider]  ?ipratropium-albuterol (DUONEB) 0.5-2.5 (3) MG/3ML SOLN Take 3 mLs by nebulization every 4 (four) hours as needed.    [provider]  ?metoprolol tartrate (LOPRESSOR) 12.5 mg TABS tablet Take 12.5 mg by mouth 2 (two) times daily.    [provider]  ?nitroGLYCERIN (NITROSTAT) 0.4 MG SL tablet Place 0.4 mg under the tongue every 5 (five) minutes as needed for chest pain.  07/07/16   [provider]  ?oxybutynin (DITROPAN) 5 MG tablet Take by mouth. 01/23/21   [provider]  ?pantoprazole (PROTONIX) 40 MG tablet Take 40 mg by mouth 2 (two) times daily.    [provider]  ?predniSONE (DELTASONE) 10 MG tablet 4 tabs po day1,2; 3 tabs po day3; 2 tabs po day4; 1 tab po day5; 1/2 tab po day6,7 ?Patient not taking: Reported on 06/19/2021 03/07/21   Alford Highland, MD  ?rosuvastatin (CRESTOR) 10 MG tablet Take 10 mg by mouth every evening. 07/07/16  [provider]  ?tamsulosin (FLOMAX) 0.4 MG CAPS capsule Take 0.8 mg by mouth daily.    [provider]  ?tiZANidine (ZANAFLEX) 4 MG tablet Take 4 mg by mouth 3 (three) times daily as needed. 05/29/21   [provider]  ?topiramate (TOPAMAX) 50 MG tablet Take 75 mg by mouth 2 (two) times daily.    [provider]  ?torsemide (DEMADEX) 20 MG tablet Take 20 mg by mouth daily.    [provider]  ?traZODone (DESYREL) 50 MG tablet Take 0.5 tablets (25 mg total) by mouth at bedtime as needed for sleep. 03/07/21   Alford HighlandWieting, Richard, MD  ?Vitamin D, Ergocalciferol, (DRISDOL) 1.25 MG  (50000 UNIT) CAPS capsule Take 50,000 Units by mouth once a week. 02/04/21   [provider]  ?warfarin (COUMADIN) 6 MG tablet Take 6 mg by mouth daily at 4 PM.    [provider]  ?   ? ?Allergies    ?Melatonin and Ramipril   ? ?Review of Systems   ?Review of Systems ? ?Physical Exam ?Updated Vital Signs ?BP 123/74   Pulse 71   Temp 98.9 ?F (37.2 ?C) (Rectal)   Resp (!) 28   Ht 5\' 8"  (1.727 m)   Wt 108.9 kg   SpO2 97%   BMI 36.49 kg/m?  ?Physical Exam ?Vitals and nursing note reviewed.  ?Constitutional:   ?   General: He is not in acute distress. ?   Appearance: He is well-developed. He is not ill-appearing, toxic-appearing or diaphoretic.  ?HENT:  ?   Head: Normocephalic and atraumatic.  ?   Comments: No visible or palpable injury to the scalp or head. ?   Right Ear: External ear normal.  ?   Left Ear: External ear normal.  ?Eyes:  ?   Conjunctiva/sclera: Conjunctivae normal.  ?   Pupils: Pupils are equal, round, and reactive to light.  ?Neck:  ?   Trachea: Phonation normal.  ?Cardiovascular:  ?   Rate and Rhythm: Normal rate and regular rhythm.  ?   Heart sounds: Normal heart sounds.  ?Pulmonary:  ?   Effort: Pulmonary effort is normal. No respiratory distress.  ?   Breath sounds: Normal breath sounds. No stridor.  ?Abdominal:  ?   General: There is no distension.  ?   Palpations: Abdomen is soft.  ?   Tenderness: There is no abdominal tenderness.  ?Musculoskeletal:     ?   General: Normal range of motion.  ?   Cervical back: Normal range of motion and neck supple.  ?   Comments: No limitation of motion of arms, legs, hands or wrists.  ?Skin: ?   General: Skin is warm and dry.  ?   Comments: Contusion and abrasion with ecchymosis, dorsum of left hand.  ?Neurological:  ?   Mental Status: He is alert and oriented to person, place, and time.  ?   Cranial Nerves: No cranial nerve deficit.  ?   Sensory: No sensory deficit.  ?   Motor: No abnormal muscle tone.  ?   Coordination: Coordination  normal.  ?Psychiatric:     ?   Mood and Affect: Mood normal.     ?   Behavior: Behavior normal.     ?   Thought Content: Thought content normal.     ?   Judgment: Judgment normal.  ? ? ?ED Results / Procedures / Treatments   ?Labs ?(all labs ordered are listed, but only abnormal results are  displayed) ?Labs Reviewed  ?BASIC METABOLIC PANEL - Abnormal; Notable for the following components:  ?    Result Value  ? Glucose, Bld 118 (*)   ? Calcium 8.5 (*)   ? All other components within normal limits  ?CBC WITH DIFFERENTIAL/PLATELET - Abnormal; Notable for the following components:  ? WBC 11.8 (*)   ? RBC 3.96 (*)   ? Hemoglobin 12.0 (*)   ? Neutro Abs 8.6 (*)   ? All other components within normal limits  ?PROTIME-INR - Abnormal; Notable for the following components:  ? Prothrombin Time 21.9 (*)   ? INR 1.9 (*)   ? All other components within normal limits  ? ? ?EKG ?EKG Interpretation ? ?Date/Time:  Monday July 07 2021 18:51:14 EDT ?Ventricular Rate:  70 ?PR Interval:  162 ?QRS Duration: 151 ?QT Interval:  460 ?QTC Calculation: 497 ?R Axis:   54 ?Text Interpretation: Sinus rhythm Right bundle branch block since last tracing no significant change Confirmed by Mancel Bale [id number]) on 07/07/2021 11:30:34 PM ? ?Radiology ?CT Head Wo Contrast ? ?Result Date: 07/07/2021 ?CLINICAL DATA:  Larey Seat in bathroom hit head EXAM: CT HEAD WITHOUT CONTRAST CT CERVICAL SPINE WITHOUT CONTRAST TECHNIQUE: Multidetector CT imaging of the head and cervical spine was performed following the standard protocol without intravenous contrast. Multiplanar CT image reconstructions of the cervical spine were also generated. RADIATION DOSE REDUCTION: This exam was performed according to the departmental dose-optimization program which includes automated exposure control, adjustment of the mA and/or kV according to patient size and/or use of iterative reconstruction technique. COMPARISON:  MRI 06/17/2021, CT brain 10/08/2020 FINDINGS: CT HEAD FINDINGS  Brain: No evidence of acute infarction, hemorrhage, hydrocephalus, extra-axial collection or mass lesion/mass effect. Vascular: No hyperdense vessels. Vertebral and carotid vascular calcification Skull: Normal. N

## 2021-07-07 NOTE — ED Triage Notes (Signed)
Patient arrived via EMS from home for fall on thinner (Coumadin). Patient states he loss balance and fell, hit head on bathroom door. EMS reported initial BP-90/60, given normal saline, 4mg  zofran.  ?

## 2021-07-07 NOTE — Discharge Instructions (Signed)
There were no serious injuries found after the episode of syncope.  The blood test, and CAT scans did not show any problems requiring treatment.  Continue to follow-up with your doctors as planned. ?

## 2021-07-10 DIAGNOSIS — D6861 Antiphospholipid syndrome: Secondary | ICD-10-CM | POA: Diagnosis not present

## 2021-07-10 DIAGNOSIS — Z7901 Long term (current) use of anticoagulants: Secondary | ICD-10-CM | POA: Diagnosis not present

## 2021-07-17 ENCOUNTER — Other Ambulatory Visit (INDEPENDENT_AMBULATORY_CARE_PROVIDER_SITE_OTHER): Payer: Self-pay | Admitting: Nurse Practitioner

## 2021-07-17 DIAGNOSIS — Z7901 Long term (current) use of anticoagulants: Secondary | ICD-10-CM | POA: Diagnosis not present

## 2021-07-17 DIAGNOSIS — Z86711 Personal history of pulmonary embolism: Secondary | ICD-10-CM | POA: Diagnosis not present

## 2021-07-17 DIAGNOSIS — I739 Peripheral vascular disease, unspecified: Secondary | ICD-10-CM

## 2021-07-17 DIAGNOSIS — D6861 Antiphospholipid syndrome: Secondary | ICD-10-CM | POA: Diagnosis not present

## 2021-07-18 ENCOUNTER — Encounter (INDEPENDENT_AMBULATORY_CARE_PROVIDER_SITE_OTHER): Payer: Self-pay

## 2021-07-18 ENCOUNTER — Encounter (INDEPENDENT_AMBULATORY_CARE_PROVIDER_SITE_OTHER): Payer: Medicare Other

## 2021-07-18 ENCOUNTER — Ambulatory Visit (INDEPENDENT_AMBULATORY_CARE_PROVIDER_SITE_OTHER): Payer: Medicare Other | Admitting: Nurse Practitioner

## 2021-07-24 DIAGNOSIS — J449 Chronic obstructive pulmonary disease, unspecified: Secondary | ICD-10-CM | POA: Diagnosis not present

## 2021-07-24 DIAGNOSIS — J9610 Chronic respiratory failure, unspecified whether with hypoxia or hypercapnia: Secondary | ICD-10-CM | POA: Diagnosis not present

## 2021-07-24 DIAGNOSIS — R55 Syncope and collapse: Secondary | ICD-10-CM | POA: Diagnosis not present

## 2021-07-31 DIAGNOSIS — I350 Nonrheumatic aortic (valve) stenosis: Secondary | ICD-10-CM | POA: Diagnosis not present

## 2021-07-31 DIAGNOSIS — Z951 Presence of aortocoronary bypass graft: Secondary | ICD-10-CM | POA: Diagnosis not present

## 2021-07-31 DIAGNOSIS — I6523 Occlusion and stenosis of bilateral carotid arteries: Secondary | ICD-10-CM | POA: Diagnosis not present

## 2021-07-31 DIAGNOSIS — R55 Syncope and collapse: Secondary | ICD-10-CM | POA: Diagnosis not present

## 2021-08-08 ENCOUNTER — Ambulatory Visit (INDEPENDENT_AMBULATORY_CARE_PROVIDER_SITE_OTHER): Payer: Medicare Other | Admitting: Nurse Practitioner

## 2021-08-08 ENCOUNTER — Encounter (INDEPENDENT_AMBULATORY_CARE_PROVIDER_SITE_OTHER): Payer: Medicare Other

## 2021-08-24 DIAGNOSIS — J9610 Chronic respiratory failure, unspecified whether with hypoxia or hypercapnia: Secondary | ICD-10-CM | POA: Diagnosis not present

## 2021-08-24 DIAGNOSIS — R55 Syncope and collapse: Secondary | ICD-10-CM | POA: Diagnosis not present

## 2021-08-24 DIAGNOSIS — J449 Chronic obstructive pulmonary disease, unspecified: Secondary | ICD-10-CM | POA: Diagnosis not present

## 2021-09-02 DIAGNOSIS — R55 Syncope and collapse: Secondary | ICD-10-CM | POA: Diagnosis not present

## 2021-09-02 DIAGNOSIS — I10 Essential (primary) hypertension: Secondary | ICD-10-CM | POA: Diagnosis not present

## 2021-09-02 DIAGNOSIS — Z7901 Long term (current) use of anticoagulants: Secondary | ICD-10-CM | POA: Diagnosis not present

## 2021-09-02 DIAGNOSIS — E1140 Type 2 diabetes mellitus with diabetic neuropathy, unspecified: Secondary | ICD-10-CM | POA: Diagnosis not present

## 2021-09-09 DIAGNOSIS — Z7901 Long term (current) use of anticoagulants: Secondary | ICD-10-CM | POA: Diagnosis not present

## 2021-09-10 ENCOUNTER — Encounter (INDEPENDENT_AMBULATORY_CARE_PROVIDER_SITE_OTHER): Payer: Self-pay | Admitting: Nurse Practitioner

## 2021-09-10 ENCOUNTER — Ambulatory Visit (INDEPENDENT_AMBULATORY_CARE_PROVIDER_SITE_OTHER): Payer: Medicare Other | Admitting: Nurse Practitioner

## 2021-09-10 ENCOUNTER — Ambulatory Visit (INDEPENDENT_AMBULATORY_CARE_PROVIDER_SITE_OTHER): Payer: Medicare Other

## 2021-09-10 VITALS — BP 101/65 | HR 89 | Resp 16 | Wt 258.8 lb

## 2021-09-10 DIAGNOSIS — I739 Peripheral vascular disease, unspecified: Secondary | ICD-10-CM | POA: Diagnosis not present

## 2021-09-10 DIAGNOSIS — I10 Essential (primary) hypertension: Secondary | ICD-10-CM

## 2021-09-10 DIAGNOSIS — E782 Mixed hyperlipidemia: Secondary | ICD-10-CM

## 2021-09-10 DIAGNOSIS — I6523 Occlusion and stenosis of bilateral carotid arteries: Secondary | ICD-10-CM

## 2021-09-23 DIAGNOSIS — R55 Syncope and collapse: Secondary | ICD-10-CM | POA: Diagnosis not present

## 2021-09-23 DIAGNOSIS — J9610 Chronic respiratory failure, unspecified whether with hypoxia or hypercapnia: Secondary | ICD-10-CM | POA: Diagnosis not present

## 2021-09-23 DIAGNOSIS — J449 Chronic obstructive pulmonary disease, unspecified: Secondary | ICD-10-CM | POA: Diagnosis not present

## 2021-09-27 ENCOUNTER — Encounter (INDEPENDENT_AMBULATORY_CARE_PROVIDER_SITE_OTHER): Payer: Self-pay | Admitting: Nurse Practitioner

## 2021-09-27 NOTE — Progress Notes (Signed)
Subjective:    Patient ID: Jose [name], male    DOB: 03-21-1955, 66 y.o.   MRN: [id number] Chief Complaint  Patient presents with   Follow-up    Ultrasound follow up     Jose [name] is a 66 year old male that returns today for follow-up evaluation of his known peripheral arterial disease.  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  Patient has been in contact with a specialist for a tilt table test.  The patient does not have claudication however he is not very active given his syncope.  There is no rest pain or ulcerations.  Today the patient has an ABI of 0.87 on the right and 1.01 on the left.  The patient previous noninvasive studies which showed an ABI 0.74 on the right and 0.7 on the left.  Additional arterial duplexes show biphasic waveforms throughout the bilateral lower extremities with normal toe waveforms bilaterally.    Review of Systems  Neurological:  Positive for dizziness and syncope.  All other systems reviewed and are negative.      Objective:   Physical Exam Vitals reviewed.  HENT:     Head: Normocephalic.  Cardiovascular:     Rate and Rhythm: Normal rate.     Pulses:          Dorsalis pedis pulses are detected w/ Doppler on the right side and detected w/ Doppler on the left side.       Posterior tibial pulses are detected w/ Doppler on the right side and detected w/ Doppler on the left side.  Pulmonary:     Effort: Pulmonary effort is normal.  Skin:    General: Skin is warm and dry.  Neurological:     Mental Status: He is alert and oriented to person, place, and time.  Psychiatric:        Mood and Affect: Mood normal.        Behavior: Behavior normal.        Thought Content: Thought content normal.        Judgment: Judgment normal.     BP 101/65 (BP Location: Left Arm)   Pulse 89   Resp 16   Wt 258 lb 12.8 oz (117.4 kg)   BMI 39.35 kg/m   Past Medical History:   Diagnosis Date   Antiphospholipid antibody syndrome (HCC)    CAD S/P percutaneous coronary angioplasty '98, '04, Feb 2017   [facility]   Carotid arterial disease (HCC) 10/2015   moderate bilateral   COPD (chronic obstructive pulmonary disease) (HCC)    Coronary artery disease    Hypertension    Non-insulin treated type 2 diabetes mellitus (HCC)    Pulmonary emboli (HCC) 2011   RBBB    Sleep apnea     Social History   Socioeconomic History   Marital status: Married    Spouse name: Not on file   Number of children: Not on file   Years of education: Not on file   Highest education level: Not on file  Occupational History   Not on file  Tobacco Use   Smoking status: Former    Packs/day: 0.50    Years: 40.00    Total pack years: 20.00    Types: Cigarettes    Quit date: 01/15/2019    Years since quitting: 2.7   Smokeless tobacco: Never   Tobacco comments:    has started smoking again, willing to quit.  Vaping Use   Vaping Use: Never used  Substance and Sexual Activity   Alcohol use: No   Drug use: No   Sexual activity: Yes  Other Topics Concern   Not on file  Social History Narrative   Not on file   Social Determinants of Health   Financial Resource Strain: Not on file  Food Insecurity: Not on file  Transportation Needs: Not on file  Physical Activity: Not on file  Stress: Not on file  Social Connections: Not on file  Intimate Partner Violence: Not on file    Past Surgical History:  Procedure Laterality Date   APPENDECTOMY     APPLICATION OF WOUND VAC Left 03/18/2019   Procedure: Application Of Wound Vac;  Surgeon: Nadara Mustard, MD;  Location: [facility] OR;  Service: Orthopedics;  Laterality: Left;   BACK SURGERY  '89, '06   COLONOSCOPY     CORONARY ANGIOPLASTY WITH STENT PLACEMENT  '98, '04, Feb 2017   I & D EXTREMITY Left 03/15/2019   Procedure: DEBRIDEMENT LEFT THIGH;  Surgeon: Nadara Mustard, MD;  Location: [facility] OR;  Service: Orthopedics;  Laterality: Left;    INCISION AND DRAINAGE OF WOUND Left 03/18/2019   Procedure: LEFT THIGH DEBRIDEMENT;  Surgeon: Nadara Mustard, MD;  Location: [facility] OR;  Service: Orthopedics;  Laterality: Left;    Family History  Problem Relation Age of Onset   CAD Father 8    Allergies  Allergen Reactions   Melatonin Anxiety   Ramipril     cough       Latest Ref Rng & Units 07/07/2021    6:52 PM 03/05/2021    6:54 AM 03/04/2021    7:47 PM  CBC  WBC 4.0 - 10.5 K/uL 11.8  6.2  10.3   Hemoglobin 13.0 - 17.0 g/dL 27.0  62.3  76.2   Hematocrit 39.0 - 52.0 % 39.5  41.9  40.6   Platelets 150 - 400 K/uL 227  218  212       CMP     Component Value Date/Time   NA 137 07/07/2021 1852   K 4.1 07/07/2021 1852   CL 100 07/07/2021 1852   CO2 28 07/07/2021 1852   GLUCOSE 118 (H) 07/07/2021 1852   BUN 9 07/07/2021 1852   CREATININE 0.75 07/07/2021 1852   CALCIUM 8.5 (L) 07/07/2021 1852   PROT 7.5 03/04/2021 1947   ALBUMIN 3.6 03/04/2021 1947   AST 19 03/04/2021 1947   ALT 23 03/04/2021 1947   ALKPHOS 69 03/04/2021 1947   BILITOT 0.5 03/04/2021 1947   GFRNONAA >60 07/07/2021 1852   GFRAA >60 03/22/2019 1504     VAS [place] ABI WITH/WO TBI  Result Date: 09/12/2021  LOWER EXTREMITY DOPPLER STUDY Patient Name:  Jose [name]  Date of Exam:   09/10/2021 Medical Rec #: [id number]      Accession #:    [id number] Date of Birth: [birth year]       Patient Gender: M Patient Age:   73 years Exam Location:  [facility] Procedure:      VAS [place] ABI WITH/WO TBI Referring Phys: Festus Barren --------------------------------------------------------------------------------  Indications: Peripheral artery disease.  Comparison Study: 03/16/2019 Performing Technologist: Debbe Bales RVS  Examination Guidelines: A complete evaluation includes at minimum, Doppler waveform signals and systolic blood pressure reading at the level of bilateral brachial, anterior tibial, and posterior tibial arteries, when vessel segments are accessible.  Bilateral testing is considered an integral part of a complete examination.  Photoelectric Plethysmograph (PPG) waveforms and toe systolic pressure readings are included as required and additional duplex testing as needed. Limited examinations for reoccurring indications may be performed as noted.  ABI Findings: +---------+------------------+-----+----------+--------+ Right    Rt Pressure (mmHg)IndexWaveform  Comment  +---------+------------------+-----+----------+--------+ Brachial 140                                       +---------+------------------+-----+----------+--------+ ATA      112               0.78 monophasic         +---------+------------------+-----+----------+--------+ PTA      124               0.87 biphasic           +---------+------------------+-----+----------+--------+ Great Toe111               0.78 Normal             +---------+------------------+-----+----------+--------+ +---------+------------------+-----+--------+-------+ Left     Lt Pressure (mmHg)IndexWaveformComment +---------+------------------+-----+--------+-------+ Brachial 143                                    +---------+------------------+-----+--------+-------+ ATA      145               1.01 biphasic        +---------+------------------+-----+--------+-------+ PTA      132               0.92 biphasic        +---------+------------------+-----+--------+-------+ Great Toe109               0.76 Normal          +---------+------------------+-----+--------+-------+ +-------+-----------+-----------+------------+------------+ ABI/TBIToday's ABIToday's TBIPrevious ABIPrevious TBI +-------+-----------+-----------+------------+------------+ Right  .87        .78        .74                      +-------+-----------+-----------+------------+------------+ Left   1.01       .76        .87                       +-------+-----------+-----------+------------+------------+ Bilateral ABIs appear increased compared to prior study on 03/16/2019.  Summary: Right: Resting right ankle-brachial index indicates mild right lower extremity arterial disease. The right toe-brachial index is normal. Left: Resting left ankle-brachial index is within normal range. No evidence of significant left lower extremity arterial disease. The left toe-brachial index is normal.  *See table(s) above for measurements and observations.  Electronically signed by Festus Barren MD on 09/12/2021 at 11:33:53 AM.    Final        Assessment & Plan:   1. PVD (peripheral vascular disease) (HCC) The patient does have known peripheral arterial disease however at this time he is not experiencing significant symptoms such as rest pain or nonhealing ulceration.  We will continue to have the patient follow-up on an annual basis.  2. Essential hypertension, benign Continue antihypertensive medications as already ordered, these medications have been reviewed and there are no changes at this time.   3. Mixed hyperlipidemia Continue statin as ordered and reviewed, no changes at this time   4. 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  The patient does have an upcoming appointment for a tilt table evaluation.  We will continue to follow his carotid stenosis on an annual basis.   Current Outpatient Medications on File Prior to Visit  Medication Sig Dispense Refill   albuterol (PROVENTIL) (5 MG/ML) 0.5% nebulizer solution Take 0.5 mLs (2.5 mg total) by nebulization every 6 (six) hours as needed for wheezing or shortness of breath. 20 mL 12   albuterol (VENTOLIN HFA) 108 (90 Base) MCG/ACT inhaler Inhale 2 puffs into the lungs every 6 (six) hours as needed for wheezing or shortness of breath.     DULoxetine  (CYMBALTA) 60 MG capsule Take 60 mg by mouth daily.     gabapentin (NEURONTIN) 600 MG tablet Take 600 mg by mouth 3 (three) times daily.     glimepiride (AMARYL) 4 MG tablet Take 4 mg by mouth 2 (two) times daily.     ipratropium-albuterol (DUONEB) 0.5-2.5 (3) MG/3ML SOLN Take 3 mLs by nebulization every 4 (four) hours as needed.     metoprolol tartrate (LOPRESSOR) 12.5 mg TABS tablet Take 12.5 mg by mouth 2 (two) times daily.     nitroGLYCERIN (NITROSTAT) 0.4 MG SL tablet Place 0.4 mg under the tongue every 5 (five) minutes as needed for chest pain.   1   oxybutynin (DITROPAN) 5 MG tablet Take by mouth.     pantoprazole (PROTONIX) 40 MG tablet Take 40 mg by mouth 2 (two) times daily.     rosuvastatin (CRESTOR) 10 MG tablet Take 10 mg by mouth every evening.  11   tamsulosin (FLOMAX) 0.4 MG CAPS capsule Take 0.8 mg by mouth daily.  0   tiZANidine (ZANAFLEX) 4 MG tablet Take 4 mg by mouth 3 (three) times daily as needed.     topiramate (TOPAMAX) 50 MG tablet Take 75 mg by mouth 2 (two) times daily.  0   torsemide (DEMADEX) 20 MG tablet Take 20 mg by mouth daily.     traZODone (DESYREL) 50 MG tablet Take 0.5 tablets (25 mg total) by mouth at bedtime as needed for sleep. 15 tablet 0   Vitamin D, Ergocalciferol, (DRISDOL) 1.25 MG (50000 UNIT) CAPS capsule Take 50,000 Units by mouth once a week.     warfarin (COUMADIN) 6 MG tablet Take 6 mg by mouth daily at 4 PM.     aspirin EC 81 MG EC tablet Take 1 tablet (81 mg total) by mouth daily. Swallow whole. (Patient not taking: Reported on 06/19/2021) 30 tablet 11   Fluticasone-Umeclidin-Vilant 200-62.5-25 MCG/INH AEPB Inhale 1 puff into the lungs daily. (Patient not taking: Reported on 06/19/2021)     predniSONE (DELTASONE) 10 MG tablet 4 tabs po day1,2; 3 tabs po day3; 2 tabs po day4; 1 tab po day5; 1/2 tab po day6,7 (Patient not taking: Reported on 06/19/2021) 15 tablet 0   No current facility-administered medications on file prior to visit.    There are  no Patient Instructions on file for this visit. No follow-ups on file.   Georgiana Spinner, NP

## 2021-10-09 DIAGNOSIS — Z7901 Long term (current) use of anticoagulants: Secondary | ICD-10-CM | POA: Diagnosis not present

## 2021-10-14 DIAGNOSIS — M65341 Trigger finger, right ring finger: Secondary | ICD-10-CM | POA: Diagnosis not present

## 2021-10-24 DIAGNOSIS — J9610 Chronic respiratory failure, unspecified whether with hypoxia or hypercapnia: Secondary | ICD-10-CM | POA: Diagnosis not present

## 2021-10-24 DIAGNOSIS — J449 Chronic obstructive pulmonary disease, unspecified: Secondary | ICD-10-CM | POA: Diagnosis not present

## 2021-10-24 DIAGNOSIS — R55 Syncope and collapse: Secondary | ICD-10-CM | POA: Diagnosis not present

## 2021-10-28 ENCOUNTER — Other Ambulatory Visit: Payer: Self-pay

## 2021-10-28 ENCOUNTER — Encounter: Payer: Self-pay | Admitting: Emergency Medicine

## 2021-10-28 ENCOUNTER — Emergency Department: Payer: Medicare Other

## 2021-10-28 DIAGNOSIS — Z743 Need for continuous supervision: Secondary | ICD-10-CM | POA: Diagnosis not present

## 2021-10-28 DIAGNOSIS — J449 Chronic obstructive pulmonary disease, unspecified: Secondary | ICD-10-CM | POA: Diagnosis not present

## 2021-10-28 DIAGNOSIS — R0902 Hypoxemia: Secondary | ICD-10-CM | POA: Diagnosis not present

## 2021-10-28 DIAGNOSIS — R0602 Shortness of breath: Secondary | ICD-10-CM | POA: Insufficient documentation

## 2021-10-28 DIAGNOSIS — R059 Cough, unspecified: Secondary | ICD-10-CM | POA: Insufficient documentation

## 2021-10-28 DIAGNOSIS — Z5321 Procedure and treatment not carried out due to patient leaving prior to being seen by health care provider: Secondary | ICD-10-CM | POA: Diagnosis not present

## 2021-10-28 DIAGNOSIS — I10 Essential (primary) hypertension: Secondary | ICD-10-CM | POA: Diagnosis not present

## 2021-10-28 DIAGNOSIS — R062 Wheezing: Secondary | ICD-10-CM | POA: Diagnosis not present

## 2021-10-28 LAB — CBC
HCT: 37.1 % — ABNORMAL LOW (ref 39.0–52.0)
Hemoglobin: 11 g/dL — ABNORMAL LOW (ref 13.0–17.0)
MCH: 28.5 pg (ref 26.0–34.0)
MCHC: 29.6 g/dL — ABNORMAL LOW (ref 30.0–36.0)
MCV: 96.1 fL (ref 80.0–100.0)
Platelets: 296 10*3/uL (ref 150–400)
RBC: 3.86 MIL/uL — ABNORMAL LOW (ref 4.22–5.81)
RDW: 14.2 % (ref 11.5–15.5)
WBC: 11.1 10*3/uL — ABNORMAL HIGH (ref 4.0–10.5)
nRBC: 0.2 % (ref 0.0–0.2)

## 2021-10-28 LAB — BASIC METABOLIC PANEL
Anion gap: 6 (ref 5–15)
BUN: 31 mg/dL — ABNORMAL HIGH (ref 8–23)
CO2: 30 mmol/L (ref 22–32)
Calcium: 8.5 mg/dL — ABNORMAL LOW (ref 8.9–10.3)
Chloride: 105 mmol/L (ref 98–111)
Creatinine, Ser: 1.36 mg/dL — ABNORMAL HIGH (ref 0.61–1.24)
GFR, Estimated: 57 mL/min — ABNORMAL LOW (ref >60)
Glucose, Bld: 76 mg/dL (ref 70–99)
Potassium: 4.2 mmol/L (ref 3.5–5.1)
Sodium: 141 mmol/L (ref 135–145)

## 2021-10-28 LAB — PROTIME-INR
INR: 3.1 — ABNORMAL HIGH (ref 0.8–1.2)
Prothrombin Time: 31.6 seconds — ABNORMAL HIGH (ref 11.4–15.2)

## 2021-10-28 LAB — TROPONIN I (HIGH SENSITIVITY): Troponin I (High Sensitivity): 9 ng/L (ref <18)

## 2021-10-28 NOTE — ED Triage Notes (Signed)
Pt to ED via [facility] coming from home c/o SOB x2-3 days.  Hx stage 4 COPD, chronic 4-6L oxygen use at home, arrives to ED on 6L [place].  Denies pain.  Has non productive cough.  Pt A&Ox4, chest rise even and speaking full and complete sentences, in NAD at this time.

## 2021-10-29 ENCOUNTER — Emergency Department
Admission: EM | Admit: 2021-10-29 | Discharge: 2021-10-29 | Discharge status: Against Medical Advice | Payer: Medicare Other | Attending: Emergency Medicine | Admitting: Emergency Medicine

## 2021-10-31 ENCOUNTER — Telehealth: Payer: Self-pay | Admitting: Emergency Medicine

## 2021-10-31 NOTE — Telephone Encounter (Signed)
Called patient due to left emergency department before provider exam to inquire about condition and follow up plans. Left message. 

## 2021-11-04 NOTE — Telephone Encounter (Signed)
Called and left another message with my number.

## 2021-11-05 ENCOUNTER — Inpatient Hospital Stay (HOSPITAL_COMMUNITY)
Admission: EM | Admit: 2021-11-05 | Discharge: 2021-11-11 | DRG: 189 | Disposition: A | Discharge status: Skilled Nursing Facility | Payer: Medicare Other | Attending: Internal Medicine | Admitting: Internal Medicine

## 2021-11-05 ENCOUNTER — Other Ambulatory Visit: Payer: Self-pay

## 2021-11-05 ENCOUNTER — Encounter (HOSPITAL_COMMUNITY): Payer: Self-pay | Admitting: Emergency Medicine

## 2021-11-05 ENCOUNTER — Emergency Department (HOSPITAL_COMMUNITY): Payer: Medicare Other

## 2021-11-05 DIAGNOSIS — J9622 Acute and chronic respiratory failure with hypercapnia: Secondary | ICD-10-CM | POA: Diagnosis not present

## 2021-11-05 DIAGNOSIS — Z20822 Contact with and (suspected) exposure to covid-19: Secondary | ICD-10-CM | POA: Diagnosis not present

## 2021-11-05 DIAGNOSIS — I2510 Atherosclerotic heart disease of native coronary artery without angina pectoris: Secondary | ICD-10-CM | POA: Diagnosis present

## 2021-11-05 DIAGNOSIS — R0600 Dyspnea, unspecified: Secondary | ICD-10-CM | POA: Diagnosis not present

## 2021-11-05 DIAGNOSIS — R404 Transient alteration of awareness: Secondary | ICD-10-CM | POA: Diagnosis not present

## 2021-11-05 DIAGNOSIS — F339 Major depressive disorder, recurrent, unspecified: Secondary | ICD-10-CM | POA: Diagnosis not present

## 2021-11-05 DIAGNOSIS — E669 Obesity, unspecified: Secondary | ICD-10-CM | POA: Diagnosis not present

## 2021-11-05 DIAGNOSIS — K219 Gastro-esophageal reflux disease without esophagitis: Secondary | ICD-10-CM | POA: Diagnosis not present

## 2021-11-05 DIAGNOSIS — Z91199 Patient's noncompliance with other medical treatment and regimen due to unspecified reason: Secondary | ICD-10-CM

## 2021-11-05 DIAGNOSIS — M6281 Muscle weakness (generalized): Secondary | ICD-10-CM | POA: Diagnosis not present

## 2021-11-05 DIAGNOSIS — E872 Acidosis, unspecified: Secondary | ICD-10-CM | POA: Diagnosis not present

## 2021-11-05 DIAGNOSIS — G4089 Other seizures: Secondary | ICD-10-CM | POA: Diagnosis not present

## 2021-11-05 DIAGNOSIS — R296 Repeated falls: Secondary | ICD-10-CM | POA: Diagnosis present

## 2021-11-05 DIAGNOSIS — R0689 Other abnormalities of breathing: Secondary | ICD-10-CM | POA: Diagnosis not present

## 2021-11-05 DIAGNOSIS — N401 Enlarged prostate with lower urinary tract symptoms: Secondary | ICD-10-CM | POA: Diagnosis not present

## 2021-11-05 DIAGNOSIS — Z9181 History of falling: Secondary | ICD-10-CM | POA: Diagnosis not present

## 2021-11-05 DIAGNOSIS — D72829 Elevated white blood cell count, unspecified: Secondary | ICD-10-CM | POA: Diagnosis not present

## 2021-11-05 DIAGNOSIS — D6861 Antiphospholipid syndrome: Secondary | ICD-10-CM | POA: Diagnosis present

## 2021-11-05 DIAGNOSIS — E875 Hyperkalemia: Secondary | ICD-10-CM | POA: Diagnosis not present

## 2021-11-05 DIAGNOSIS — R32 Unspecified urinary incontinence: Secondary | ICD-10-CM | POA: Diagnosis present

## 2021-11-05 DIAGNOSIS — Z6836 Body mass index (BMI) 36.0-36.9, adult: Secondary | ICD-10-CM | POA: Diagnosis not present

## 2021-11-05 DIAGNOSIS — J9621 Acute and chronic respiratory failure with hypoxia: Secondary | ICD-10-CM | POA: Diagnosis present

## 2021-11-05 DIAGNOSIS — Z743 Need for continuous supervision: Secondary | ICD-10-CM | POA: Diagnosis not present

## 2021-11-05 DIAGNOSIS — R5381 Other malaise: Secondary | ICD-10-CM | POA: Diagnosis not present

## 2021-11-05 DIAGNOSIS — N179 Acute kidney failure, unspecified: Secondary | ICD-10-CM | POA: Diagnosis not present

## 2021-11-05 DIAGNOSIS — I5032 Chronic diastolic (congestive) heart failure: Secondary | ICD-10-CM | POA: Diagnosis not present

## 2021-11-05 DIAGNOSIS — E785 Hyperlipidemia, unspecified: Secondary | ICD-10-CM | POA: Diagnosis not present

## 2021-11-05 DIAGNOSIS — Z951 Presence of aortocoronary bypass graft: Secondary | ICD-10-CM | POA: Diagnosis not present

## 2021-11-05 DIAGNOSIS — R4182 Altered mental status, unspecified: Secondary | ICD-10-CM | POA: Diagnosis not present

## 2021-11-05 DIAGNOSIS — E1169 Type 2 diabetes mellitus with other specified complication: Secondary | ICD-10-CM | POA: Diagnosis present

## 2021-11-05 DIAGNOSIS — I10 Essential (primary) hypertension: Secondary | ICD-10-CM | POA: Diagnosis present

## 2021-11-05 DIAGNOSIS — Z7901 Long term (current) use of anticoagulants: Secondary | ICD-10-CM

## 2021-11-05 DIAGNOSIS — E662 Morbid (severe) obesity with alveolar hypoventilation: Secondary | ICD-10-CM | POA: Diagnosis not present

## 2021-11-05 DIAGNOSIS — R269 Unspecified abnormalities of gait and mobility: Secondary | ICD-10-CM | POA: Diagnosis present

## 2021-11-05 DIAGNOSIS — I499 Cardiac arrhythmia, unspecified: Secondary | ICD-10-CM | POA: Diagnosis not present

## 2021-11-05 DIAGNOSIS — Z86711 Personal history of pulmonary embolism: Secondary | ICD-10-CM | POA: Diagnosis not present

## 2021-11-05 DIAGNOSIS — E8809 Other disorders of plasma-protein metabolism, not elsewhere classified: Secondary | ICD-10-CM | POA: Diagnosis not present

## 2021-11-05 DIAGNOSIS — J441 Chronic obstructive pulmonary disease with (acute) exacerbation: Secondary | ICD-10-CM | POA: Diagnosis present

## 2021-11-05 DIAGNOSIS — F17210 Nicotine dependence, cigarettes, uncomplicated: Secondary | ICD-10-CM | POA: Diagnosis not present

## 2021-11-05 DIAGNOSIS — R9431 Abnormal electrocardiogram [ECG] [EKG]: Secondary | ICD-10-CM | POA: Diagnosis not present

## 2021-11-05 DIAGNOSIS — I959 Hypotension, unspecified: Secondary | ICD-10-CM | POA: Diagnosis present

## 2021-11-05 DIAGNOSIS — Z79899 Other long term (current) drug therapy: Secondary | ICD-10-CM

## 2021-11-05 DIAGNOSIS — Z955 Presence of coronary angioplasty implant and graft: Secondary | ICD-10-CM

## 2021-11-05 DIAGNOSIS — Z8249 Family history of ischemic heart disease and other diseases of the circulatory system: Secondary | ICD-10-CM

## 2021-11-05 LAB — CBC WITH DIFFERENTIAL/PLATELET
Abs Immature Granulocytes: 0.18 10*3/uL — ABNORMAL HIGH (ref 0.00–0.07)
Basophils Absolute: 0.1 10*3/uL (ref 0.0–0.1)
Basophils Relative: 0 %
Eosinophils Absolute: 0.1 10*3/uL (ref 0.0–0.5)
Eosinophils Relative: 1 %
HCT: 36.9 % — ABNORMAL LOW (ref 39.0–52.0)
Hemoglobin: 10.5 g/dL — ABNORMAL LOW (ref 13.0–17.0)
Immature Granulocytes: 2 %
Lymphocytes Relative: 20 %
Lymphs Abs: 2.5 10*3/uL (ref 0.7–4.0)
MCH: 28.8 pg (ref 26.0–34.0)
MCHC: 28.5 g/dL — ABNORMAL LOW (ref 30.0–36.0)
MCV: 101.1 fL — ABNORMAL HIGH (ref 80.0–100.0)
Monocytes Absolute: 1.4 10*3/uL — ABNORMAL HIGH (ref 0.1–1.0)
Monocytes Relative: 12 %
Neutro Abs: 8.1 10*3/uL — ABNORMAL HIGH (ref 1.7–7.7)
Neutrophils Relative %: 65 %
Platelets: 395 10*3/uL (ref 150–400)
RBC: 3.65 MIL/uL — ABNORMAL LOW (ref 4.22–5.81)
RDW: 15 % (ref 11.5–15.5)
WBC: 12.3 10*3/uL — ABNORMAL HIGH (ref 4.0–10.5)
nRBC: 0.2 % (ref 0.0–0.2)

## 2021-11-05 LAB — I-STAT ARTERIAL BLOOD GAS, ED
Acid-Base Excess: 4 mmol/L — ABNORMAL HIGH (ref 0.0–2.0)
Acid-Base Excess: 3 mmol/L — ABNORMAL HIGH (ref 0.0–2.0)
Bicarbonate: 36.2 mmol/L — ABNORMAL HIGH (ref 20.0–28.0)
Bicarbonate: 32.8 mmol/L — ABNORMAL HIGH (ref 20.0–28.0)
Calcium, Ion: 1.25 mmol/L (ref 1.15–1.40)
Calcium, Ion: 1.22 mmol/L (ref 1.15–1.40)
HCT: 33 % — ABNORMAL LOW (ref 39.0–52.0)
HCT: 32 % — ABNORMAL LOW (ref 39.0–52.0)
Hemoglobin: 11.2 g/dL — ABNORMAL LOW (ref 13.0–17.0)
Hemoglobin: 10.9 g/dL — ABNORMAL LOW (ref 13.0–17.0)
O2 Saturation: 96 %
O2 Saturation: 92 %
Potassium: 4.8 mmol/L (ref 3.5–5.1)
Potassium: 5 mmol/L (ref 3.5–5.1)
Sodium: 143 mmol/L (ref 135–145)
Sodium: 141 mmol/L (ref 135–145)
TCO2: 39 mmol/L — ABNORMAL HIGH (ref 22–32)
TCO2: 35 mmol/L — ABNORMAL HIGH (ref 22–32)
pCO2 arterial: 108.3 mmHg (ref 32–48)
pCO2 arterial: 77.3 mmHg (ref 32–48)
pH, Arterial: 7.132 — CL (ref 7.35–7.45)
pH, Arterial: 7.235 — ABNORMAL LOW (ref 7.35–7.45)
pO2, Arterial: 114 mmHg — ABNORMAL HIGH (ref 83–108)
pO2, Arterial: 77 mmHg — ABNORMAL LOW (ref 83–108)

## 2021-11-05 LAB — COMPREHENSIVE METABOLIC PANEL
ALT: 22 U/L (ref 0–44)
AST: 17 U/L (ref 15–41)
Albumin: 3.3 g/dL — ABNORMAL LOW (ref 3.5–5.0)
Alkaline Phosphatase: 70 U/L (ref 38–126)
Anion gap: 6 (ref 5–15)
BUN: 18 mg/dL (ref 8–23)
CO2: 33 mmol/L — ABNORMAL HIGH (ref 22–32)
Calcium: 8.8 mg/dL — ABNORMAL LOW (ref 8.9–10.3)
Chloride: 104 mmol/L (ref 98–111)
Creatinine, Ser: 1.26 mg/dL — ABNORMAL HIGH (ref 0.61–1.24)
GFR, Estimated: >60 mL/min (ref >60)
Glucose, Bld: 129 mg/dL — ABNORMAL HIGH (ref 70–99)
Potassium: 5 mmol/L (ref 3.5–5.1)
Sodium: 143 mmol/L (ref 135–145)
Total Bilirubin: 0.3 mg/dL (ref 0.3–1.2)
Total Protein: 7.8 g/dL (ref 6.5–8.1)

## 2021-11-05 LAB — PROTIME-INR
INR: 2.3 — ABNORMAL HIGH (ref 0.8–1.2)
Prothrombin Time: 24.7 seconds — ABNORMAL HIGH (ref 11.4–15.2)

## 2021-11-05 LAB — RAPID URINE DRUG SCREEN, HOSP PERFORMED
Amphetamines: NOT DETECTED
Barbiturates: NOT DETECTED
Benzodiazepines: NOT DETECTED
Cocaine: NOT DETECTED
Opiates: NOT DETECTED
Tetrahydrocannabinol: NOT DETECTED

## 2021-11-05 LAB — BRAIN NATRIURETIC PEPTIDE: B Natriuretic Peptide: 292.6 pg/mL — ABNORMAL HIGH (ref 0.0–100.0)

## 2021-11-05 LAB — URINALYSIS, ROUTINE W REFLEX MICROSCOPIC
Bacteria, UA: NONE SEEN
Bilirubin Urine: NEGATIVE
Glucose, UA: NEGATIVE mg/dL
Hgb urine dipstick: NEGATIVE
Ketones, ur: NEGATIVE mg/dL
Leukocytes,Ua: NEGATIVE
Nitrite: NEGATIVE
Protein, ur: NEGATIVE mg/dL
Specific Gravity, Urine: 1.017 (ref 1.005–1.030)
pH: 5 (ref 5.0–8.0)

## 2021-11-05 LAB — MRSA NEXT GEN BY PCR, NASAL: MRSA by PCR Next Gen: NOT DETECTED

## 2021-11-05 LAB — RESP PANEL BY RT-PCR (FLU A&B, COVID) ARPGX2
Influenza A by PCR: NEGATIVE
Influenza B by PCR: NEGATIVE
SARS Coronavirus 2 by RT PCR: NEGATIVE

## 2021-11-05 LAB — LACTIC ACID, PLASMA: Lactic Acid, Venous: 0.8 mmol/L (ref 0.5–1.9)

## 2021-11-05 LAB — TROPONIN I (HIGH SENSITIVITY): Troponin I (High Sensitivity): 9 ng/L (ref <18)

## 2021-11-05 MED ORDER — VANCOMYCIN HCL 1500 MG/300ML IV SOLN: 1500.0000 mg | INTRAVENOUS | Status: DC

## 2021-11-05 MED ORDER — VANCOMYCIN HCL 2000 MG/400ML IV SOLN
2000.0000 mg | Freq: Once | INTRAVENOUS | Status: AC
  Administered 2021-11-05: 2000 mg via INTRAVENOUS
  Filled 2021-11-05: qty 400

## 2021-11-05 MED ORDER — METHYLPREDNISOLONE SODIUM SUCC 125 MG IJ SOLR
125.0000 mg | Freq: Once | INTRAMUSCULAR | Status: AC
  Administered 2021-11-05: 125 mg via INTRAVENOUS
  Filled 2021-11-05: qty 2

## 2021-11-05 MED ORDER — SODIUM CHLORIDE 0.9 % IV SOLN
2.0000 g | Freq: Once | INTRAVENOUS | Status: AC
  Administered 2021-11-05: 2 g via INTRAVENOUS
  Filled 2021-11-05: qty 12.5

## 2021-11-05 MED ORDER — SODIUM CHLORIDE 0.9 % IV SOLN: 2.0000 g | Freq: Three times a day (TID) | INTRAVENOUS | Status: DC

## 2021-11-05 NOTE — H&P (Signed)
History and Physical    Patient: Jose [name] HGD:[id number] DOB: July 10, 1955 DOA: 11/05/2021 DOS: the patient was seen and examined on 11/06/2021 PCP: [name], MD  Patient coming from: Home  Chief Complaint:  Chief Complaint  Patient presents with   Altered Mental Status   HPI: Jose [name] is a 66 y.o. male with medical history significant of CAD s/p CABG x3, mild aortic valve stenosis, peripheral vascular disease, OSA, COPD on 4 to 6 L noncompliant with trilogy, type 2 diabetes, hyperlipidemia, PE, antiphospholipid antibody syndrome on warfarin presents with altered mental status and found to be cyanotic by EMS.  He has been having increasing shortness of breath both at rest and with exertion for the past 2 weeks.  Has increased cough.  Reports compliance with his home inhalers but has stopped using his trilogy ventilator for at least a year due to loud noise from the machine.  Also relapsed into smoking this year and smokes about half a pack per day.  He denies any chest pain or worsening lower extremity edema.  He is compliant with his Coumadin and took a dose earlier today.  In the ED, he was afebrile with initial BP of 80 over 70s although unclear if this is a erroneous reading.  He was acidotic with ABG of 7.132, CO2 of 108 and placed on BiPAP.  After 2 hours he had improvement to pH of 7.235, 77 CO2. PCCM was consulted recommends continuing BiPAP therapy until tomorrow and to transition his IV Vanco and cefepime to azithromycin since he did not have any findings of pneumonia on chest x-ray. Hospitalist then consulted for admission.  Review of Systems: As mentioned in the history of present illness. All other systems reviewed and are negative. Past Medical History:  Diagnosis Date   Antiphospholipid antibody syndrome [facility])    CAD S/P percutaneous coronary angioplasty '98, '04, Feb 2017   [facility]   Carotid arterial disease (HCC) 10/2015   moderate bilateral   COPD (chronic  obstructive pulmonary disease) (HCC)    Coronary artery disease    Hypertension    Non-insulin treated type 2 diabetes mellitus (HCC)    Pulmonary emboli (HCC) 2011   RBBB    Sleep apnea    Past Surgical History:  Procedure Laterality Date   APPENDECTOMY     APPLICATION OF WOUND VAC Left 03/18/2019   Procedure: Application Of Wound Vac;  Surgeon: Nadara Mustard, MD;  Location: [facility] OR;  Service: Orthopedics;  Laterality: Left;   BACK SURGERY  '89, '06   COLONOSCOPY     CORONARY ANGIOPLASTY WITH STENT PLACEMENT  '98, '04, Feb 2017   I & D EXTREMITY Left 03/15/2019   Procedure: DEBRIDEMENT LEFT THIGH;  Surgeon: Nadara Mustard, MD;  Location: [facility] OR;  Service: Orthopedics;  Laterality: Left;   INCISION AND DRAINAGE OF WOUND Left 03/18/2019   Procedure: LEFT THIGH DEBRIDEMENT;  Surgeon: Nadara Mustard, MD;  Location: [facility] OR;  Service: Orthopedics;  Laterality: Left;   Social History:  reports that he quit smoking about 2 years ago. His smoking use included cigarettes. He has a 20.00 pack-year smoking history. He has never used smokeless tobacco. He reports that he does not drink alcohol and does not use drugs.  Allergies  Allergen Reactions   Melatonin Anxiety   Ramipril     cough    Family History  Problem Relation Age of Onset   CAD Father 28    Prior to Admission medications  Medication Sig Start Date End Date Taking? Authorizing Provider  albuterol (PROVENTIL) (5 MG/ML) 0.5% nebulizer solution Take 0.5 mLs (2.5 mg total) by nebulization every 6 (six) hours as needed for wheezing or shortness of breath. 12/27/18   Terald Sleeper, MD  albuterol (VENTOLIN HFA) 108 (90 Base) MCG/ACT inhaler Inhale 2 puffs into the lungs every 6 (six) hours as needed for wheezing or shortness of breath.    [provider]  aspirin EC 81 MG EC tablet Take 1 tablet (81 mg total) by mouth daily. Swallow whole. Patient not taking: Reported on 06/19/2021 01/23/20   Dagar, Geralynn Rile, MD  DULoxetine  (CYMBALTA) 60 MG capsule Take 60 mg by mouth daily.    [provider]  Fluticasone-Umeclidin-Vilant 200-62.5-25 MCG/INH AEPB Inhale 1 puff into the lungs daily. Patient not taking: Reported on 06/19/2021    [provider]  gabapentin (NEURONTIN) 600 MG tablet Take 600 mg by mouth 3 (three) times daily.    [provider]  glimepiride (AMARYL) 4 MG tablet Take 4 mg by mouth 2 (two) times daily. 12/04/20   [provider]  ipratropium-albuterol (DUONEB) 0.5-2.5 (3) MG/3ML SOLN Take 3 mLs by nebulization every 4 (four) hours as needed.    [provider]  metoprolol tartrate (LOPRESSOR) 12.5 mg TABS tablet Take 12.5 mg by mouth 2 (two) times daily.    [provider]  nitroGLYCERIN (NITROSTAT) 0.4 MG SL tablet Place 0.4 mg under the tongue every 5 (five) minutes as needed for chest pain.  07/07/16   [provider]  oxybutynin (DITROPAN) 5 MG tablet Take by mouth. 01/23/21   [provider]  pantoprazole (PROTONIX) 40 MG tablet Take 40 mg by mouth 2 (two) times daily.    [provider]  predniSONE (DELTASONE) 10 MG tablet 4 tabs po day1,2; 3 tabs po day3; 2 tabs po day4; 1 tab po day5; 1/2 tab po day6,7 Patient not taking: Reported on 06/19/2021 03/07/21   Alford Highland, MD  rosuvastatin (CRESTOR) 10 MG tablet Take 10 mg by mouth every evening. 07/07/16   [provider]  tamsulosin (FLOMAX) 0.4 MG CAPS capsule Take 0.8 mg by mouth daily.    [provider]  tiZANidine (ZANAFLEX) 4 MG tablet Take 4 mg by mouth 3 (three) times daily as needed. 05/29/21   [provider]  topiramate (TOPAMAX) 50 MG tablet Take 75 mg by mouth 2 (two) times daily.    [provider]  torsemide (DEMADEX) 20 MG tablet Take 20 mg by mouth daily.    [provider]  traZODone (DESYREL) 50 MG tablet Take 0.5 tablets (25 mg total) by mouth at bedtime as needed for sleep. 03/07/21   Alford Highland, MD   Vitamin D, Ergocalciferol, (DRISDOL) 1.25 MG (50000 UNIT) CAPS capsule Take 50,000 Units by mouth once a week. 02/04/21   [provider]  warfarin (COUMADIN) 6 MG tablet Take 6 mg by mouth daily at 4 PM.    [provider]    Physical Exam: Vitals:   11/05/21 2300 11/05/21 2315 11/05/21 2330 11/05/21 2345  BP: 114/64 (!) 123/57 (!) 118/57 115/60  Pulse: 71 76 73 74  Resp: (!) 21 (!) 21 (!) 21 (!) 27  Temp:      TempSrc:      SpO2: 96% 94% 95% 96%  Weight:      Height:       Constitutional: NAD, calm, comfortable, obese male sitting upright on BiPAP Eyes:  lids  and conjunctivae normal ENMT: Mucous membranes are moist. Neck: normal, supple, Respiratory: Diffuse rhonchi with expiratory wheeze.  Normal respiratory effort on BiPAP. No accessory muscle use.  Still able to speak in full sentences without significant labored respiration Cardiovascular: Regular rate and rhythm, no murmurs / rubs / gallops.  Nonpitting edema bilateral up to mid pretibial region.   Abdomen: no tenderness,  Bowel sounds positive.  Musculoskeletal: no clubbing / cyanosis. No joint deformity upper and lower extremities. Good ROM, no contractures. Normal muscle tone.  Skin: Dry scaly skin of bilateral lower extremity. Neurologic: CN 2-12 grossly intact.  Strength 5/5 in all 4.  Psychiatric: Normal judgment and insight. Alert and oriented x 3. Normal mood. Data Reviewed:  EKG on my review showed RBBB  Assessment and Plan: * Acute on chronic respiratory failure with hypercapnia (HCC) Acute on chronic respiratory failure with hypercapnia and hypoxia secondary to COPD exacerbation with ongoing tobacco use.  Initial ABG with acidosis of 7.132 and CO2 of 108 -Baseline on 4 to 6 L of oxygen.  Reports he has been previously followed up by pulmonology at [facility] but has not seen them in over a year.  He has been noncompliant with trilogy ventilator for at least a year. -PCCM has evaluated and will follow  along.  Recommends continuing BiPAP overnight.  We will follow repeat ABG in the morning. -Continue IV Solu-Medrol -He was given IV vancomycin and cefepime in the ED but no obvious pneumonia seen on chest x-ray.  Will transition IV doxycycline.  AKI (acute kidney injury) (HCC) Creatinine elevated 1.26 from prior of 0.7.  Continuous IV fluid overnight.  Prolonged QT interval QTc of 506. Avoid QT prolongation medication.  S/P CABG x 3 Asymptomatic stable.    Type 2 diabetes mellitus with hyperlipidemia (HCC) - Obtain hemoglobin A1c.  Placed on sensitive SSI  History of pulmonary embolus (PE)-2011 Also with history of antiphospholipid syndrome. -Therapeutic today on presentation.  Continue warfarin per pharmacy.      Advance Care Planning:   Code Status: Full Code   Consults: Critical care  Family Communication: No family at bedside  Severity of Illness: The appropriate patient status for this patient is INPATIENT. Inpatient status is judged to be reasonable and necessary in order to provide the required intensity of service to ensure the patient's safety. The patient's presenting symptoms, physical exam findings, and initial radiographic and laboratory data in the context of their chronic comorbidities is felt to place them at high risk for further clinical deterioration. Furthermore, it is not anticipated that the patient will be medically stable for discharge from the hospital within 2 midnights of admission.   * I certify that at the point of admission it is my clinical judgment that the patient will require inpatient hospital care spanning beyond 2 midnights from the point of admission due to high intensity of service, high risk for further deterioration and high frequency of surveillance required.*  Author: Anselm Jungling, DO 11/06/2021 12:19 AM  For on call review [email or web address].

## 2021-11-05 NOTE — Progress Notes (Signed)
ABG re-checked.  Dr. Rodena Medin made aware of results.

## 2021-11-05 NOTE — Consult Note (Signed)
NAME:  Jose [name], MRN:  [id number], DOB:  07-05-1955, LOS: 0 ADMISSION DATE:  11/05/2021, CONSULTATION DATE: 11/05/2021 REFERRING MD: Dr. 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  He has apparently been on a trilogy ventilator at home since June 2022 followed in the fellows pulmonary clinic at [facility].  He was on CPAP prior to that.  Apparently poor compliance.  Supposed to be on oxygen at 6 L/min at home. He was brought to the emergency department after being found altered and cyanotic at home.  Initial ABG 7.1 3/108/114.  He was placed on BiPAP.  Mental status has been improving with NIPPV.  Other labs significant for mild leukocytosis, hyperkalemia, renal insufficiency, mildly elevated BNP.  Chest x-ray with prior median sternotomy, normal heart size, no infiltrates.  Pertinent  Medical History   Past Medical History:  Diagnosis Date   Antiphospholipid antibody syndrome (HCC)    CAD S/P percutaneous coronary angioplasty '98, '04, Feb 2017   [facility]   Carotid arterial disease (HCC) 10/2015   moderate bilateral   COPD (chronic obstructive pulmonary disease) (HCC)    Coronary artery disease    Hypertension    Non-insulin treated type 2 diabetes mellitus (HCC)    Pulmonary emboli (HCC) 2011   RBBB    Sleep apnea     Significant Hospital Events: Including procedures, antibiotic start and stop dates in addition to other pertinent events     Interim History / Subjective:  Starting to wake up, feel better Notes some dyspnea, wheezing but denies significant cough or sputum  Objective   Blood pressure (!) 104/58, pulse 74, temperature (!) 97.4 F (36.3 C), temperature source Axillary, resp. rate 20, height 5\' 8"  (1.727 m), weight 108.9 kg, SpO2 95  %.       No intake or output data in the 24 hours ending 11/05/21 2237 Filed Weights   11/05/21 2029  Weight: 108.9 kg    Examination: General: Obese man, laying in bed with BiPAP in place HENT: Oropharynx moist, no stridor, strong voice Lungs: Distant bilaterally, prolonged expiratory phase, bilateral expiratory wheezes Cardiovascular: Distant, regular, no murmur Abdomen: Obese, somewhat tympanitic, hypoactive bowel sounds Extremities: Trace pretibial edema Neuro: Wakes easily, interacts, answers questions, strong cough, moves all extremities, follows commands, oriented to self and place GU: Deferred  Resolved Hospital Problem list     Assessment & Plan:  Acute on chronic hypercapnic and hypoxemic respiratory failure in patient with apparent acute exacerbation of COPD superimposed on OSA/OHS.  He is benefiting significantly from BiPAP, clinically improved.  ABG also improving.  No clear evidence for pneumonia by history or chest x-ray -Recommend continue BiPAP overnight, then try giving him a break on 8/24.  Continue to use as needed to support him through this exacerbation.  He is on positive pressure at night chronically -Scheduled bronchodilators -Agree with IV corticosteroids, transition to p.o. and taper when he is able to tolerate -Would simplify antibiotics in absence of any clear pneumonia, change to azithromycin for 5 days in setting of AE-COPD -Okay to admit to progressive bed -We will follow   Labs   CBC: Recent Labs  Lab 11/05/21 2043 11/05/21 2052  WBC 12.3*  --   NEUTROABS 8.1*  --   HGB 10.5* 11.2*  HCT 36.9* 33.0*  MCV 101.1*  --   PLT 395  --  Basic Metabolic Panel: Recent Labs  Lab 11/05/21 2043 11/05/21 2052  NA 143 143  K 5.0 4.8  CL 104  --   CO2 33*  --   GLUCOSE 129*  --   BUN 18  --   CREATININE 1.26*  --   CALCIUM 8.8*  --    GFR: Estimated Creatinine Clearance: 69 mL/min (A) (by C-G formula based on SCr of 1.26 mg/dL  (H)). Recent Labs  Lab 11/05/21 2043  WBC 12.3*  LATICACIDVEN 0.8    Liver Function Tests: Recent Labs  Lab 11/05/21 2043  AST 17  ALT 22  ALKPHOS 70  BILITOT 0.3  PROT 7.8  ALBUMIN 3.3*   No results for input(s): "LIPASE", "AMYLASE" in the last 168 hours. No results for input(s): "AMMONIA" in the last 168 hours.  ABG    Component Value Date/Time   PHART 7.132 (LL) 11/05/2021 2052   PCO2ART 108.3 (HH) 11/05/2021 2052   PO2ART 114 (H) 11/05/2021 2052   HCO3 36.2 (H) 11/05/2021 2052   TCO2 39 (H) 11/05/2021 2052   O2SAT 96 11/05/2021 2052     Coagulation Profile: Recent Labs  Lab 11/05/21 2043  INR 2.3*    Cardiac Enzymes: No results for input(s): "CKTOTAL", "CKMB", "CKMBINDEX", "TROPONINI" in the last 168 hours.  HbA1C: Hgb A1c MFr Bld  Date/Time Value Ref Range Status  03/05/2021 06:54 AM 8.1 (H) 4.8 - 5.6 % Final    Comment:    (NOTE)         Prediabetes: 5.7 - 6.4         Diabetes: >6.4         Glycemic control for adults with diabetes: <7.0   07/17/2020 04:18 AM 8.0 (H) 4.8 - 5.6 % Final    Comment:    (NOTE) Pre diabetes:          5.7%-6.4%  Diabetes:              >6.4%  Glycemic control for   <7.0% adults with diabetes     CBG: No results for input(s): "GLUCAP" in the last 168 hours.  Review of Systems:   As per HPI  Past Medical History:  He,  has a past medical history of Antiphospholipid antibody syndrome [facility]), CAD S/P percutaneous coronary angioplasty ('98, '04, Feb 2017), Carotid arterial disease ([place]) (10/2015), COPD (chronic obstructive pulmonary disease) ([place]), Coronary artery disease, Hypertension, Non-insulin treated type 2 diabetes mellitus ([place]), Pulmonary emboli ([place]) (2011), RBBB, and Sleep apnea.   Surgical History:   Past Surgical History:  Procedure Laterality Date   APPENDECTOMY     APPLICATION OF WOUND VAC Left 03/18/2019   Procedure: Application Of Wound Vac;  Surgeon: Newt Minion, MD;  Location: [place];   Service: Orthopedics;  Laterality: Left;   BACK SURGERY  '89, '06   COLONOSCOPY     CORONARY ANGIOPLASTY WITH STENT PLACEMENT  '98, '04, Feb 2017   I & D EXTREMITY Left 03/15/2019   Procedure: DEBRIDEMENT LEFT THIGH;  Surgeon: Newt Minion, MD;  Location: [place];  Service: Orthopedics;  Laterality: Left;   INCISION AND DRAINAGE OF WOUND Left 03/18/2019   Procedure: LEFT THIGH DEBRIDEMENT;  Surgeon: Newt Minion, MD;  Location: [place];  Service: Orthopedics;  Laterality: Left;     Social History:   reports that he quit smoking about 2 years ago. His smoking use included cigarettes. He has a 20.00 pack-year smoking history. He has never used smokeless tobacco. He reports  that he does not drink alcohol and does not use drugs.   Family History:  His family history includes CAD (age of onset: 80) in his father.   Allergies Allergies  Allergen Reactions   Melatonin Anxiety   Ramipril     cough     Home Medications  Prior to Admission medications   Medication Sig Start Date End Date Taking? Authorizing Provider  albuterol (PROVENTIL) (5 MG/ML) 0.5% nebulizer solution Take 0.5 mLs (2.5 mg total) by nebulization every 6 (six) hours as needed for wheezing or shortness of breath. 12/27/18   Terald Sleeper, MD  albuterol (VENTOLIN HFA) 108 (90 Base) MCG/ACT inhaler Inhale 2 puffs into the lungs every 6 (six) hours as needed for wheezing or shortness of breath.    [provider]  aspirin EC 81 MG EC tablet Take 1 tablet (81 mg total) by mouth daily. Swallow whole. Patient not taking: Reported on 06/19/2021 01/23/20   Dagar, Geralynn Rile, MD  DULoxetine (CYMBALTA) 60 MG capsule Take 60 mg by mouth daily.    [provider]  Fluticasone-Umeclidin-Vilant 200-62.5-25 MCG/INH AEPB Inhale 1 puff into the lungs daily. Patient not taking: Reported on 06/19/2021    [provider]  gabapentin (NEURONTIN) 600 MG tablet Take 600 mg by mouth 3 (three) times daily.    [provider]  glimepiride (AMARYL) 4 MG tablet Take 4 mg by mouth 2 (two) times daily. 12/04/20   [provider]  ipratropium-albuterol (DUONEB) 0.5-2.5 (3) MG/3ML SOLN Take 3 mLs by nebulization every 4 (four) hours as needed.    [provider]  metoprolol tartrate (LOPRESSOR) 12.5 mg TABS tablet Take 12.5 mg by mouth 2 (two) times daily.    [provider]  nitroGLYCERIN (NITROSTAT) 0.4 MG SL tablet Place 0.4 mg under the tongue every 5 (five) minutes as needed for chest pain.  07/07/16   [provider]  oxybutynin (DITROPAN) 5 MG tablet Take by mouth. 01/23/21   [provider]  pantoprazole (PROTONIX) 40 MG tablet Take 40 mg by mouth 2 (two) times daily.    [provider]  predniSONE (DELTASONE) 10 MG tablet 4 tabs po day1,2; 3 tabs po day3; 2 tabs po day4; 1 tab po day5; 1/2 tab po day6,7 Patient not taking: Reported on 06/19/2021 03/07/21   Alford Highland, MD  rosuvastatin (CRESTOR) 10 MG tablet Take 10 mg by mouth every evening. 07/07/16   [provider]  tamsulosin (FLOMAX) 0.4 MG CAPS capsule Take 0.8 mg by mouth daily.    [provider]  tiZANidine (ZANAFLEX) 4 MG tablet Take 4 mg by mouth 3 (three) times daily as needed. 05/29/21   [provider]  topiramate (TOPAMAX) 50 MG tablet Take 75 mg by mouth 2 (two) times daily.    [provider]  torsemide (DEMADEX) 20 MG tablet Take 20 mg by mouth daily.    [provider]  traZODone (DESYREL) 50 MG tablet Take 0.5 tablets (25 mg total) by mouth at bedtime as needed for sleep. 03/07/21   Alford Highland, MD  Vitamin D, Ergocalciferol, (DRISDOL) 1.25 MG (50000 UNIT) CAPS capsule Take 50,000 Units by mouth once a week. 02/04/21   [provider]  warfarin (COUMADIN) 6 MG tablet Take 6 mg by mouth daily at 4 PM.    [provider]     Critical care time: N/A     Levy Pupa, MD, PhD 11/05/2021, 11:14 PM [place]  [facility] [phone]  or if no answer before 7:00PM call [phone] For any issues after 7:00PM please call eLink [phone]

## 2021-11-05 NOTE — ED Triage Notes (Signed)
Pt bib [facility] from home for altered mental status. EMS found pt to be cyanotic at home - placed on NRB. Hx of uti. 5 albuterol and 100 NS given pta.   BP 130/90, HR 90, Spo2 100% NRB

## 2021-11-05 NOTE — Progress Notes (Signed)
Critical ABG results given to Dr. Rodena Medin.  Bipap orders at this time. Pt placed on documented Bipap settings. Pt tolerating well.

## 2021-11-05 NOTE — ED Provider Notes (Signed)
[facility] EMERGENCY DEPARTMENT Provider Note   CSN: [id number] Arrival date & time: 11/05/21  2023     History  Chief Complaint  Patient presents with   Altered Mental Status    Jose [name] is a 66 y.o. male.  66 year old male with prior medical history as detailed below presents for evaluation.  Patient arrives by EMS from home.  Patient with apparent decreased responsiveness per family.  Patient was noted to be cyanotic with EMS.  Patient's mental status improved significantly with administration of supplemental oxygen.  It is unclear whether the patient was wearing his oxygen and or using trilogy machine at home.  Patient with longstanding history of COPD.   The history is provided by the patient and medical records.       Home Medications Prior to Admission medications   Medication Sig Start Date End Date Taking? Authorizing Provider  albuterol (PROVENTIL) (5 MG/ML) 0.5% nebulizer solution Take 0.5 mLs (2.5 mg total) by nebulization every 6 (six) hours as needed for wheezing or shortness of breath. 12/27/18   Terald Sleeper, MD  albuterol (VENTOLIN HFA) 108 (90 Base) MCG/ACT inhaler Inhale 2 puffs into the lungs every 6 (six) hours as needed for wheezing or shortness of breath.    [provider]  aspirin EC 81 MG EC tablet Take 1 tablet (81 mg total) by mouth daily. Swallow whole. Patient not taking: Reported on 06/19/2021 01/23/20   Dagar, Geralynn Rile, MD  DULoxetine (CYMBALTA) 60 MG capsule Take 60 mg by mouth daily.    [provider]  Fluticasone-Umeclidin-Vilant 200-62.5-25 MCG/INH AEPB Inhale 1 puff into the lungs daily. Patient not taking: Reported on 06/19/2021    [provider]  gabapentin (NEURONTIN) 600 MG tablet Take 600 mg by mouth 3 (three) times daily.    [provider]  glimepiride (AMARYL) 4 MG tablet Take 4 mg by mouth 2 (two) times daily. 12/04/20   [provider]  ipratropium-albuterol  (DUONEB) 0.5-2.5 (3) MG/3ML SOLN Take 3 mLs by nebulization every 4 (four) hours as needed.    [provider]  metoprolol tartrate (LOPRESSOR) 12.5 mg TABS tablet Take 12.5 mg by mouth 2 (two) times daily.    [provider]  nitroGLYCERIN (NITROSTAT) 0.4 MG SL tablet Place 0.4 mg under the tongue every 5 (five) minutes as needed for chest pain.  07/07/16   [provider]  oxybutynin (DITROPAN) 5 MG tablet Take by mouth. 01/23/21   [provider]  pantoprazole (PROTONIX) 40 MG tablet Take 40 mg by mouth 2 (two) times daily.    [provider]  predniSONE (DELTASONE) 10 MG tablet 4 tabs po day1,2; 3 tabs po day3; 2 tabs po day4; 1 tab po day5; 1/2 tab po day6,7 Patient not taking: Reported on 06/19/2021 03/07/21   Alford Highland, MD  rosuvastatin (CRESTOR) 10 MG tablet Take 10 mg by mouth every evening. 07/07/16   [provider]  tamsulosin (FLOMAX) 0.4 MG CAPS capsule Take 0.8 mg by mouth daily.    [provider]  tiZANidine (ZANAFLEX) 4 MG tablet Take 4 mg by mouth 3 (three) times daily as needed. 05/29/21   [provider]  topiramate (TOPAMAX) 50 MG tablet Take 75 mg by mouth 2 (two) times daily.    [provider]  torsemide (DEMADEX) 20 MG tablet Take 20 mg by mouth daily.    [provider]  traZODone (DESYREL) 50 MG tablet Take 0.5 tablets (25 mg total) by  mouth at bedtime as needed for sleep. 03/07/21   Alford Highland, MD  Vitamin D, Ergocalciferol, (DRISDOL) 1.25 MG (50000 UNIT) CAPS capsule Take 50,000 Units by mouth once a week. 02/04/21   [provider]  warfarin (COUMADIN) 6 MG tablet Take 6 mg by mouth daily at 4 PM.    [provider]      Allergies    Melatonin and Ramipril    Review of Systems   Review of Systems  All other systems reviewed and are negative.   Physical Exam Updated Vital Signs BP (!) 104/58   Pulse 76   Temp (!) 97.4 F (36.3 C) (Axillary)    Resp (!) 22   Ht 5\' 8"  (1.727 m)   Wt 108.9 kg   SpO2 96%   BMI 36.49 kg/m  Physical Exam Vitals and nursing note reviewed.  Constitutional:      General: He is not in acute distress.    Appearance: He is well-developed.     Comments: Somnolent but arousable  Answers questions appropriately  HENT:     Head: Normocephalic and atraumatic.  Eyes:     Conjunctiva/sclera: Conjunctivae normal.     Pupils: Pupils are equal, round, and reactive to light.  Cardiovascular:     Rate and Rhythm: Normal rate and regular rhythm.     Heart sounds: Normal heart sounds.  Pulmonary:     Effort: Pulmonary effort is normal. No respiratory distress.     Breath sounds: Normal breath sounds.  Abdominal:     General: There is no distension.     Palpations: Abdomen is soft.     Tenderness: There is no abdominal tenderness.  Musculoskeletal:        General: No deformity. Normal range of motion.     Cervical back: Normal range of motion and neck supple.  Skin:    General: Skin is warm and dry.  Neurological:     General: No focal deficit present.     Mental Status: He is oriented to person, place, and time. Mental status is at baseline.     Comments: Somnolent but arousable  Normal speech  Answers questions appropriately  No facial droop  Moves all 4 extremities     ED Results / Procedures / Treatments   Labs (all labs ordered are listed, but only abnormal results are displayed) Labs Reviewed  COMPREHENSIVE METABOLIC PANEL - Abnormal; Notable for the following components:      Result Value   CO2 33 (*)    Glucose, Bld 129 (*)    Creatinine, Ser 1.26 (*)    Calcium 8.8 (*)    Albumin 3.3 (*)    All other components within normal limits  BRAIN NATRIURETIC PEPTIDE - Abnormal; Notable for the following components:   B Natriuretic Peptide 292.6 (*)    All other components within normal limits  CBC WITH DIFFERENTIAL/PLATELET - Abnormal; Notable for the following components:   WBC 12.3  (*)    RBC 3.65 (*)    Hemoglobin 10.5 (*)    HCT 36.9 (*)    MCV 101.1 (*)    MCHC 28.5 (*)    Neutro Abs 8.1 (*)    Monocytes Absolute 1.4 (*)    Abs Immature Granulocytes 0.18 (*)    All other components within normal limits  PROTIME-INR - Abnormal; Notable for the following components:   Prothrombin Time 24.7 (*)    INR 2.3 (*)    All other components within normal  limits  I-STAT ARTERIAL BLOOD GAS, ED - Abnormal; Notable for the following components:   pH, Arterial 7.132 (*)    pCO2 arterial 108.3 (*)    pO2, Arterial 114 (*)    Bicarbonate 36.2 (*)    TCO2 39 (*)    Acid-Base Excess 4.0 (*)    HCT 33.0 (*)    Hemoglobin 11.2 (*)    All other components within normal limits  I-STAT ARTERIAL BLOOD GAS, ED - Abnormal; Notable for the following components:   pH, Arterial 7.235 (*)    pCO2 arterial 77.3 (*)    pO2, Arterial 77 (*)    Bicarbonate 32.8 (*)    TCO2 35 (*)    Acid-Base Excess 3.0 (*)    HCT 32.0 (*)    Hemoglobin 10.9 (*)    All other components within normal limits  RESP PANEL BY RT-PCR (FLU A&B, COVID) ARPGX2  MRSA NEXT GEN BY PCR, NASAL  URINALYSIS, ROUTINE W REFLEX MICROSCOPIC  LACTIC ACID, PLASMA  RAPID URINE DRUG SCREEN, HOSP PERFORMED  LACTIC ACID, PLASMA  BLOOD GAS, ARTERIAL  I-STAT CHEM 8, ED  TYPE AND SCREEN  TROPONIN I (HIGH SENSITIVITY)  TROPONIN I (HIGH SENSITIVITY)    EKG None  Radiology DG Chest Port 1 View  Result Date: 11/05/2021 CLINICAL DATA:  Dyspnea EXAM: PORTABLE CHEST 1 VIEW COMPARISON:  10/28/2021 FINDINGS: Lungs are clear. No pneumothorax or pleural effusion. Coronary artery bypass grafting has been performed. Cardiac size within normal limits. Pulmonary vascularity is normal. IMPRESSION: No active disease. Electronically Signed   By: Helyn Numbers M.D.   On: 11/05/2021 21:16    Procedures Procedures    Medications Ordered in ED Medications  vancomycin (VANCOREADY) IVPB 2000 mg/400 mL (2,000 mg Intravenous New  Bag/Given 11/05/21 2150)  vancomycin (VANCOREADY) IVPB 1500 mg/300 mL (has no administration in time range)  ceFEPIme (MAXIPIME) 2 g in sodium chloride 0.9 % 100 mL IVPB (has no administration in time range)  methylPREDNISolone sodium succinate (SOLU-MEDROL) 125 mg/2 mL injection 125 mg (125 mg Intravenous Given 11/05/21 2114)  ceFEPIme (MAXIPIME) 2 g in sodium chloride 0.9 % 100 mL IVPB (0 g Intravenous Stopped 11/05/21 2149)    ED Course/ Medical Decision Making/ A&P                           Medical Decision Making Amount and/or Complexity of Data Reviewed Labs: ordered. Radiology: ordered.  Risk Prescription drug management. Decision regarding hospitalization.    Medical Screen Complete  This patient presented to the ED with complaint of AMS.  This complaint involves an extensive number of treatment options. The initial differential diagnosis includes, but is not limited to, hypoxia, hypercapnia, other metabolic disorder, infection, etc.  This presentation is: Acute, Chronic, Self-Limited, Previously Undiagnosed, Uncertain Prognosis, Complicated, Systemic Symptoms, and Threat to Life/Bodily Function  Patient with significant history of COPD presents with AMS.  Presentation is consistent with hypercapnia.  Initial ABG reveals PCO2 greater than 100.  With BiPAP patient's mental status continues to improve and repeat ABG is improved as well.  Broad-spectrum antibiotics and Solu-Medrol administered here in the ED.  Critical care was consulted given patient's significant level of hypercapnia.  Critical care feels that the patient will be appropriately managed by hospitalist service.  Hospitalist service is aware of case and will evaluate for admission.    Additional history obtained:  Additional history obtained from EMS External records from outside sources obtained and reviewed  including prior ED visits and prior Inpatient records.    Lab Tests:  I ordered and  personally interpreted labs.  The pertinent results include: CBC, ABG, CMP, BNP, COVID, flu, lactic acid   Imaging Studies ordered:  I ordered imaging studies including chest x-ray I independently visualized and interpreted obtained imaging which showed NAD I agree with the radiologist interpretation.   Cardiac Monitoring:  The patient was maintained on a cardiac monitor.  I personally viewed and interpreted the cardiac monitor which showed an underlying rhythm of: NSR   Medicines ordered:  I ordered medication including antibiotics, steroids for COPD exacerbation Reevaluation of the patient after these medicines showed that the patient: improved    Problem List / ED Course:  AMS, hypercapnia   Reevaluation:  After the interventions noted above, I reevaluated the patient and found that they have: improved   Disposition:  After consideration of the diagnostic results and the patients response to treatment, I feel that the patent would benefit from admission.    CRITICAL CARE Performed by: Wynetta Fines   Total critical care time: 30 minutes  Critical care time was exclusive of separately billable procedures and treating other patients.  Critical care was necessary to treat or prevent imminent or life-threatening deterioration.  Critical care was time spent personally by me on the following activities: development of treatment plan with patient and/or surrogate as well as nursing, discussions with consultants, evaluation of patient's response to treatment, examination of patient, obtaining history from patient or surrogate, ordering and performing treatments and interventions, ordering and review of laboratory studies, ordering and review of radiographic studies, pulse oximetry and re-evaluation of patient's condition.         Final Clinical Impression(s) / ED Diagnoses Final diagnoses:  Altered mental status, unspecified altered mental status type   Hypercapnia    Rx / DC Orders ED Discharge Orders     None         Wynetta Fines, MD 11/05/21 [phone]

## 2021-11-05 NOTE — Progress Notes (Signed)
Pharmacy Antibiotic Note  Jose [name] is a 66 y.o. male for which pharmacy has been consulted for cefepime and vancomycin dosing for pneumonia.  Patient with a history of MI, anti-phospholipid antibody, HF, COPD, T2DM, GERD, Stenosing tenosynovitis right ring finger. Patient presenting via EMS for AMS and cyanotic.  SCr 1.26 - 0.75 in April but down from 1.36 on 8/15 WBC 12.3; LA 0.8; T 97.4; HR 73; RR 18  Plan: Cefepime 2g q8hr Vancomycin 2000 mg once then 1500 mg q24hr (eAUC 543.3) unless change in renal function Trend WBC, Fever, Renal function, & Clinical course F/u cultures, clinical course, WBC, fever De-escalate when able Levels at steady state F/u MRSA PCR  Height: 5\' 8"  (172.7 cm) Weight: 108.9 kg (240 lb) IBW/kg (Calculated) : 68.4  Temp (24hrs), Avg:97.4 F (36.3 C), Min:97.4 F (36.3 C), Max:97.4 F (36.3 C)  No results for input(s): "WBC", "CREATININE", "LATICACIDVEN", "VANCOTROUGH", "VANCOPEAK", "VANCORANDOM", "GENTTROUGH", "GENTPEAK", "GENTRANDOM", "TOBRATROUGH", "TOBRAPEAK", "TOBRARND", "AMIKACINPEAK", "AMIKACINTROU", "AMIKACIN" in the last 168 hours.  Estimated Creatinine Clearance: 63.9 mL/min (A) (by C-G formula based on SCr of 1.36 mg/dL (H)).    Allergies  Allergen Reactions   Melatonin Anxiety   Ramipril     cough    Antimicrobials this admission: vancomycin 8/23 >>  cefepime 8/23 >>  Microbiology results: Pending  Thank you for allowing pharmacy to be a part of this patient's care.  9/23, PharmD, BCPS 11/05/2021 8:40 PM ED Clinical Pharmacist -  [phone]

## 2021-11-06 DIAGNOSIS — J9622 Acute and chronic respiratory failure with hypercapnia: Secondary | ICD-10-CM | POA: Diagnosis not present

## 2021-11-06 DIAGNOSIS — J441 Chronic obstructive pulmonary disease with (acute) exacerbation: Secondary | ICD-10-CM

## 2021-11-06 LAB — BASIC METABOLIC PANEL
Anion gap: 6 (ref 5–15)
BUN: 17 mg/dL (ref 8–23)
CO2: 30 mmol/L (ref 22–32)
Calcium: 8.5 mg/dL — ABNORMAL LOW (ref 8.9–10.3)
Chloride: 106 mmol/L (ref 98–111)
Creatinine, Ser: 1.16 mg/dL (ref 0.61–1.24)
GFR, Estimated: >60 mL/min (ref >60)
Glucose, Bld: 143 mg/dL — ABNORMAL HIGH (ref 70–99)
Potassium: 5.4 mmol/L — ABNORMAL HIGH (ref 3.5–5.1)
Sodium: 142 mmol/L (ref 135–145)

## 2021-11-06 LAB — I-STAT ARTERIAL BLOOD GAS, ED
Acid-Base Excess: 4 mmol/L — ABNORMAL HIGH (ref 0.0–2.0)
Bicarbonate: 32.8 mmol/L — ABNORMAL HIGH (ref 20.0–28.0)
Calcium, Ion: 1.24 mmol/L (ref 1.15–1.40)
HCT: 33 % — ABNORMAL LOW (ref 39.0–52.0)
Hemoglobin: 11.2 g/dL — ABNORMAL LOW (ref 13.0–17.0)
O2 Saturation: 95 %
Potassium: 5 mmol/L (ref 3.5–5.1)
Sodium: 141 mmol/L (ref 135–145)
TCO2: 35 mmol/L — ABNORMAL HIGH (ref 22–32)
pCO2 arterial: 75.8 mmHg (ref 32–48)
pH, Arterial: 7.245 — ABNORMAL LOW (ref 7.35–7.45)
pO2, Arterial: 94 mmHg (ref 83–108)

## 2021-11-06 LAB — GLUCOSE, CAPILLARY
Glucose-Capillary: 97 mg/dL (ref 70–99)
Glucose-Capillary: 183 mg/dL — ABNORMAL HIGH (ref 70–99)
Glucose-Capillary: 130 mg/dL — ABNORMAL HIGH (ref 70–99)

## 2021-11-06 LAB — TYPE AND SCREEN
ABO/RH(D): A POS
Antibody Screen: NEGATIVE

## 2021-11-06 LAB — CBC
HCT: 35.8 % — ABNORMAL LOW (ref 39.0–52.0)
Hemoglobin: 10.3 g/dL — ABNORMAL LOW (ref 13.0–17.0)
MCH: 29.1 pg (ref 26.0–34.0)
MCHC: 28.8 g/dL — ABNORMAL LOW (ref 30.0–36.0)
MCV: 101.1 fL — ABNORMAL HIGH (ref 80.0–100.0)
Platelets: 366 10*3/uL (ref 150–400)
RBC: 3.54 MIL/uL — ABNORMAL LOW (ref 4.22–5.81)
RDW: 15 % (ref 11.5–15.5)
WBC: 14.6 10*3/uL — ABNORMAL HIGH (ref 4.0–10.5)
nRBC: 0.1 % (ref 0.0–0.2)

## 2021-11-06 LAB — PROCALCITONIN: Procalcitonin: <0.1 ng/mL

## 2021-11-06 LAB — TROPONIN I (HIGH SENSITIVITY): Troponin I (High Sensitivity): 11 ng/L (ref <18)

## 2021-11-06 LAB — HEMOGLOBIN A1C
Hgb A1c MFr Bld: 6.5 % — ABNORMAL HIGH (ref 4.8–5.6)
Mean Plasma Glucose: 139.85 mg/dL

## 2021-11-06 LAB — ABO/RH: ABO/RH(D): A POS

## 2021-11-06 LAB — CBG MONITORING, ED: Glucose-Capillary: 145 mg/dL — ABNORMAL HIGH (ref 70–99)

## 2021-11-06 MED ORDER — WARFARIN - PHARMACIST DOSING INPATIENT: Freq: Every day | Status: DC

## 2021-11-06 MED ORDER — IPRATROPIUM-ALBUTEROL 0.5-2.5 (3) MG/3ML IN SOLN
3.0000 mL | Freq: Four times a day (QID) | RESPIRATORY_TRACT | Status: DC | PRN
  Administered 2021-11-07: 3 mL via RESPIRATORY_TRACT
  Filled 2021-11-06 (×2): qty 3

## 2021-11-06 MED ORDER — LACTATED RINGERS IV SOLN: INTRAVENOUS | Status: AC

## 2021-11-06 MED ORDER — DOXYCYCLINE HYCLATE 100 MG IV SOLR
100.0000 mg | Freq: Two times a day (BID) | INTRAVENOUS | Status: DC
  Administered 2021-11-06 – 2021-11-09 (×8): 100 mg via INTRAVENOUS
  Filled 2021-11-06 (×8): qty 100

## 2021-11-06 MED ORDER — INSULIN ASPART 100 UNIT/ML IJ SOLN
0.0000 [IU] | Freq: Three times a day (TID) | INTRAMUSCULAR | Status: DC
  Administered 2021-11-06 – 2021-11-07 (×3): 2 [IU] via SUBCUTANEOUS
  Administered 2021-11-07: 1 [IU] via SUBCUTANEOUS
  Administered 2021-11-08 – 2021-11-09 (×2): 2 [IU] via SUBCUTANEOUS
  Administered 2021-11-09: 1 [IU] via SUBCUTANEOUS
  Administered 2021-11-10: 2 [IU] via SUBCUTANEOUS

## 2021-11-06 MED ORDER — WARFARIN SODIUM 5 MG PO TABS
5.0000 mg | ORAL_TABLET | Freq: Once | ORAL | Status: AC
  Administered 2021-11-06: 5 mg via ORAL
  Filled 2021-11-06: qty 1

## 2021-11-06 MED ORDER — METHYLPREDNISOLONE SODIUM SUCC 40 MG IJ SOLR
40.0000 mg | Freq: Every day | INTRAMUSCULAR | Status: DC
  Administered 2021-11-06 – 2021-11-10 (×5): 40 mg via INTRAVENOUS
  Filled 2021-11-06 (×5): qty 1

## 2021-11-06 NOTE — Assessment & Plan Note (Addendum)
Acute on chronic respiratory failure with hypercapnia and hypoxia secondary to COPD exacerbation with ongoing tobacco use.  Initial ABG with acidosis of 7.132 and CO2 of 108 -Baseline on 4 to 6 L of oxygen.  Reports he has been previously followed up by pulmonology at [facility] but has not seen them in over a year.  He has been noncompliant with trilogy ventilator for at least a year. -PCCM has evaluated and will follow along.  Recommends continuing BiPAP overnight.  We will follow repeat ABG in the morning. -Continue IV Solu-Medrol -He was given IV vancomycin and cefepime in the ED but no obvious pneumonia seen on chest x-ray.  Will transition IV doxycycline.

## 2021-11-06 NOTE — ED Notes (Signed)
ED TO INPATIENT HANDOFF REPORT  ED Nurse Name and Phone #: Joice Lofts [phone]  S Name/Age/Gender Jose [name] 66 y.o. male Room/Bed: 016C/016C  Code Status   Code Status: Full Code  Home/SNF/Other Home Patient oriented to: self, place, time, and situation Is this baseline? Yes   Triage Complete: Triage complete  Chief Complaint Acute on chronic respiratory failure with hypercapnia (HCC) [J96.22]  Triage Note Pt bib [facility] from home for altered mental status. EMS found pt to be cyanotic at home - placed on NRB. Hx of uti. 5 albuterol and 100 NS given pta.   BP 130/90, HR 90, Spo2 100% NRB    Allergies Allergies  Allergen Reactions   Melatonin Anxiety   Altace [Ramipril] Cough    Level of Care/Admitting Diagnosis ED Disposition     ED Disposition  Admit   Condition  --   Comment  H[facility] [[id number]]  Level of Care: Progressive [102]  Admit to Progressive based on following criteria: RESPIRATORY PROBLEMS hypoxemic/hypercapnic respiratory failure that is responsive to NIPPV (BiPAP) or High Flow Nasal Cannula (6-80 lpm). Frequent assessment/intervention, no > Q2 hrs < Q4 hrs, to maintain oxygenation and pulmonary hygiene.  May admit patient to [facility] or [facility] if equivalent level of care is available:: No  Covid Evaluation: Asymptomatic - no recent exposure (last 10 days) testing not required  Diagnosis: Acute on chronic respiratory failure with hypercapnia [facility]) [[id number]]  Admitting Physician: Anselm Jungling [[id number]]  Attending Physician: Anselm Jungling [[id number]]  Certification:: I certify this patient will need inpatient services for at least 2 midnights  Estimated Length of Stay: 4          B Medical/Surgery History Past Medical History:  Diagnosis Date   Antiphospholipid antibody syndrome (HCC)    CAD S/P percutaneous coronary angioplasty '98, '04, Feb 2017   [facility]   Carotid arterial disease (HCC) 10/2015   moderate  bilateral   COPD (chronic obstructive pulmonary disease) (HCC)    Coronary artery disease    Hypertension    Non-insulin treated type 2 diabetes mellitus (HCC)    Pulmonary emboli (HCC) 2011   RBBB    Sleep apnea    Past Surgical History:  Procedure Laterality Date   APPENDECTOMY     APPLICATION OF WOUND VAC Left 03/18/2019   Procedure: Application Of Wound Vac;  Surgeon: Nadara Mustard, MD;  Location: [facility] OR;  Service: Orthopedics;  Laterality: Left;   BACK SURGERY  '89, '06   COLONOSCOPY     CORONARY ANGIOPLASTY WITH STENT PLACEMENT  '98, '04, Feb 2017   I & D EXTREMITY Left 03/15/2019   Procedure: DEBRIDEMENT LEFT THIGH;  Surgeon: Nadara Mustard, MD;  Location: [facility] OR;  Service: Orthopedics;  Laterality: Left;   INCISION AND DRAINAGE OF WOUND Left 03/18/2019   Procedure: LEFT THIGH DEBRIDEMENT;  Surgeon: Nadara Mustard, MD;  Location: [facility] OR;  Service: Orthopedics;  Laterality: Left;     A IV Location/Drains/Wounds Patient Lines/Drains/Airways Status     Active Line/Drains/Airways     Name Placement date Placement time Site Days   Peripheral IV 11/05/21 20 G Left Antecubital 11/05/21  2038  Antecubital  1   Peripheral IV 11/05/21 22 G Anterior;Left Forearm 11/05/21  2039  Forearm  1   Peripheral IV 11/05/21 18 G Anterior;Proximal;Right;Upper Arm 11/05/21  2100  Arm  1   External Urinary Catheter 11/06/21  0000  --  less than 1  Intake/Output Last 24 hours  Intake/Output Summary (Last 24 hours) at 11/06/2021 0851 Last data filed at 11/05/2021 2200 Gross per 24 hour  Intake --  Output 250 ml  Net -250 ml    Labs/Imaging Results for orders placed or performed during the hospital encounter of 11/05/21 (from the past 48 hour(s))  Urinalysis, Routine w reflex microscopic     Status: None   Collection Time: 11/05/21  8:31 PM  Result Value Ref Range   Color, Urine YELLOW YELLOW   APPearance CLEAR CLEAR   Specific Gravity, Urine 1.017 1.005 - 1.030   pH 5.0 5.0 -  8.0   Glucose, UA NEGATIVE NEGATIVE mg/dL   Hgb urine dipstick NEGATIVE NEGATIVE   Bilirubin Urine NEGATIVE NEGATIVE   Ketones, ur NEGATIVE NEGATIVE mg/dL   Protein, ur NEGATIVE NEGATIVE mg/dL   Nitrite NEGATIVE NEGATIVE   Leukocytes,Ua NEGATIVE NEGATIVE   RBC / HPF 0-5 0 - 5 RBC/hpf   WBC, UA 0-5 0 - 5 WBC/hpf   Bacteria, UA NONE SEEN NONE SEEN   Squamous Epithelial / LPF 0-5 0 - 5   Mucus PRESENT    Hyaline Casts, UA PRESENT     Comment: Performed at [facility], [address], [place], [place] [address]  Urine rapid drug screen (hosp performed)     Status: None   Collection Time: 11/05/21  8:31 PM  Result Value Ref Range   Opiates NONE DETECTED NONE DETECTED   Cocaine NONE DETECTED NONE DETECTED   Benzodiazepines NONE DETECTED NONE DETECTED   Amphetamines NONE DETECTED NONE DETECTED   Tetrahydrocannabinol NONE DETECTED NONE DETECTED   Barbiturates NONE DETECTED NONE DETECTED    Comment: (NOTE) DRUG SCREEN FOR MEDICAL PURPOSES ONLY.  IF CONFIRMATION IS NEEDED FOR ANY PURPOSE, NOTIFY LAB WITHIN 5 DAYS.  LOWEST DETECTABLE LIMITS FOR URINE DRUG SCREEN Drug Class                     Cutoff (ng/mL) Amphetamine and metabolites    1000 Barbiturate and metabolites    200 Benzodiazepine                 200 Tricyclics and metabolites     300 Opiates and metabolites        300 Cocaine and metabolites        300 THC                            50 Performed at [facility], [address], [place], [place] [address]   Resp Panel by RT-PCR (Flu A&B, Covid) Anterior Nasal Swab     Status: None   Collection Time: 11/05/21  8:31 PM   Specimen: Anterior Nasal Swab  Result Value Ref Range   SARS Coronavirus 2 by RT PCR NEGATIVE NEGATIVE    Comment: (NOTE) SARS-CoV-2 target nucleic acids are NOT DETECTED.  The SARS-CoV-2 RNA is generally detectable in upper respiratory specimens during the acute phase of infection. The lowest concentration of SARS-CoV-2 viral copies  this assay can detect is 138 copies/mL. A negative result does not preclude SARS-Cov-2 infection and should not be used as the sole basis for treatment or other patient management decisions. A negative result may occur with  improper specimen collection/handling, submission of specimen other than nasopharyngeal swab, presence of viral mutation(s) within the areas targeted by this assay, and inadequate number of viral copies(<138 copies/mL). A negative result must  be combined with clinical observations, patient history, and epidemiological information. The expected result is Negative.  Fact Sheet for Patients:  BloggerCourse.com  Fact Sheet for Healthcare Providers:  SeriousBroker.it  This test is no t yet approved or cleared by the [place] FDA and  has been authorized for detection and/or diagnosis of SARS-CoV-2 by FDA under an Emergency Use Authorization (EUA). This EUA will remain  in effect (meaning this test can be used) for the duration of the COVID-19 declaration under Section 564(b)(1) of the Act, 21 U.S.C.section 360bbb-3(b)(1), unless the authorization is terminated  or revoked sooner.       Influenza A by PCR NEGATIVE NEGATIVE   Influenza B by PCR NEGATIVE NEGATIVE    Comment: (NOTE) The Xpert Xpress SARS-CoV-2/FLU/RSV plus assay is intended as an aid in the diagnosis of influenza from Nasopharyngeal swab specimens and should not be used as a sole basis for treatment. Nasal washings and aspirates are unacceptable for Xpert Xpress SARS-CoV-2/FLU/RSV testing.  Fact Sheet for Patients: BloggerCourse.com  Fact Sheet for Healthcare Providers: SeriousBroker.it  This test is not yet approved or cleared by the [place] FDA and has been authorized for detection and/or diagnosis of SARS-CoV-2 by FDA under an Emergency Use Authorization (EUA). This EUA will  remain in effect (meaning this test can be used) for the duration of the COVID-19 declaration under Section 564(b)(1) of the Act, 21 U.S.C. section 360bbb-3(b)(1), unless the authorization is terminated or revoked.  Performed at [facility], [address] [place], [place] [address]   Type and screen [facility]     Status: None   Collection Time: 11/05/21  8:32 PM  Result Value Ref Range   ABO/RH(D) A POS    Antibody Screen NEG    Sample Expiration      11/08/2021,2359 Performed at [facility], [address], [place], [place] [address]   MRSA Next Gen by PCR, Nasal     Status: None   Collection Time: 11/05/21  8:41 PM   Specimen: Nasal Swab  Result Value Ref Range   MRSA by PCR Next Gen NOT DETECTED NOT DETECTED    Comment: (NOTE) The GeneXpert MRSA Assay (FDA approved for NASAL specimens only), is one component of a comprehensive MRSA colonization surveillance program. It is not intended to diagnose MRSA infection nor to guide or monitor treatment for MRSA infections. Test performance is not FDA approved in patients less than 73 years old. Performed at [facility], [address], [place], [place] [address]   Troponin I (High Sensitivity)     Status: None   Collection Time: 11/05/21  8:43 PM  Result Value Ref Range   Troponin I (High Sensitivity) 9 <18 ng/L    Comment: (NOTE) Elevated high sensitivity troponin I (hsTnI) values and significant  changes across serial measurements may suggest ACS but many other  chronic and acute conditions are known to elevate hsTnI results.  Refer to the "Links" section for chest pain algorithms and additional  guidance. Performed at [facility], [address] [place], [place] [address]   Comprehensive metabolic panel     Status: Abnormal   Collection Time: 11/05/21  8:43 PM  Result Value Ref Range   Sodium 143 135 - 145 mmol/L   Potassium 5.0 3.5 - 5.1 mmol/L   Chloride 104 98 - 111  mmol/L   CO2 33 (H) 22 - 32 mmol/L   Glucose, Bld 129 (H)  70 - 99 mg/dL    Comment: Glucose reference range applies only to samples taken after fasting for at least 8 hours.   BUN 18 8 - 23 mg/dL   Creatinine, Ser 1.61 (H) 0.61 - 1.24 mg/dL   Calcium 8.8 (L) 8.9 - 10.3 mg/dL   Total Protein 7.8 6.5 - 8.1 g/dL   Albumin 3.3 (L) 3.5 - 5.0 g/dL   AST 17 15 - 41 U/L   ALT 22 0 - 44 U/L   Alkaline Phosphatase 70 38 - 126 U/L   Total Bilirubin 0.3 0.3 - 1.2 mg/dL   GFR, Estimated >09 >60 mL/min    Comment: (NOTE) Calculated using the CKD-EPI Creatinine Equation (2021)    Anion gap 6 5 - 15    Comment: Performed at [facility], [address] [place], [place] [address]  Brain natriuretic peptide     Status: Abnormal   Collection Time: 11/05/21  8:43 PM  Result Value Ref Range   B Natriuretic Peptide 292.6 (H) 0.0 - 100.0 pg/mL    Comment: Performed at [facility], [address], [place], [place] [address]  Lactic acid, plasma     Status: None   Collection Time: 11/05/21  8:43 PM  Result Value Ref Range   Lactic Acid, Venous 0.8 0.5 - 1.9 mmol/L    Comment: Performed at [facility], [address] [place], [place] [address]  CBC with Differential     Status: Abnormal   Collection Time: 11/05/21  8:43 PM  Result Value Ref Range   WBC 12.3 (H) 4.0 - 10.5 K/uL   RBC 3.65 (L) 4.22 - 5.81 MIL/uL   Hemoglobin 10.5 (L) 13.0 - 17.0 g/dL   HCT 56.2 (L) 13.0 - 86.5 %   MCV 101.1 (H) 80.0 - 100.0 fL   MCH 28.8 26.0 - 34.0 pg   MCHC 28.5 (L) 30.0 - 36.0 g/dL   RDW 78.4 69.6 - 29.5 %   Platelets 395 150 - 400 K/uL   nRBC 0.2 0.0 - 0.2 %   Neutrophils Relative % 65 %   Neutro Abs 8.1 (H) 1.7 - 7.7 K/uL   Lymphocytes Relative 20 %   Lymphs Abs 2.5 0.7 - 4.0 K/uL   Monocytes Relative 12 %   Monocytes Absolute 1.4 (H) 0.1 - 1.0 K/uL   Eosinophils Relative 1 %   Eosinophils Absolute 0.1 0.0 - 0.5 K/uL   Basophils Relative 0 %   Basophils Absolute 0.1 0.0 - 0.1 K/uL    Immature Granulocytes 2 %   Abs Immature Granulocytes 0.18 (H) 0.00 - 0.07 K/uL    Comment: Performed at [facility], [address] [place], [place] [address]  Protime-INR     Status: Abnormal   Collection Time: 11/05/21  8:43 PM  Result Value Ref Range   Prothrombin Time 24.7 (H) 11.4 - 15.2 seconds   INR 2.3 (H) 0.8 - 1.2    Comment: (NOTE) INR goal varies based on device and disease states. Performed at [facility], [address], [place], [place] [address]   I-Stat arterial blood gas, ED     Status: Abnormal   Collection Time: 11/05/21  8:52 PM  Result Value Ref Range   pH, Arterial 7.132 (LL) 7.35 - 7.45   pCO2 arterial 108.3 (HH) 32 - 48 mmHg   pO2, Arterial 114 (H) 83 - 108 mmHg   Bicarbonate 36.2 (H) 20.0 - 28.0 mmol/L  TCO2 39 (H) 22 - 32 mmol/L   O2 Saturation 96 %   Acid-Base Excess 4.0 (H) 0.0 - 2.0 mmol/L   Sodium 143 135 - 145 mmol/L   Potassium 4.8 3.5 - 5.1 mmol/L   Calcium, Ion 1.25 1.15 - 1.40 mmol/L   HCT 33.0 (L) 39.0 - 52.0 %   Hemoglobin 11.2 (L) 13.0 - 17.0 g/dL   Collection site RADIAL, ALLEN'S TEST ACCEPTABLE    Drawn by RT    Sample type ARTERIAL    Comment NOTIFIED PHYSICIAN   I-Stat arterial blood gas, ED     Status: Abnormal   Collection Time: 11/05/21 10:56 PM  Result Value Ref Range   pH, Arterial 7.235 (L) 7.35 - 7.45   pCO2 arterial 77.3 (HH) 32 - 48 mmHg   pO2, Arterial 77 (L) 83 - 108 mmHg   Bicarbonate 32.8 (H) 20.0 - 28.0 mmol/L   TCO2 35 (H) 22 - 32 mmol/L   O2 Saturation 92 %   Acid-Base Excess 3.0 (H) 0.0 - 2.0 mmol/L   Sodium 141 135 - 145 mmol/L   Potassium 5.0 3.5 - 5.1 mmol/L   Calcium, Ion 1.22 1.15 - 1.40 mmol/L   HCT 32.0 (L) 39.0 - 52.0 %   Hemoglobin 10.9 (L) 13.0 - 17.0 g/dL   Collection site RADIAL, ALLEN'S TEST ACCEPTABLE    Drawn by RT    Sample type ARTERIAL    Comment MD NOTIFIED, SUGGEST RECOLLECT   Troponin I (High Sensitivity)     Status: None   Collection Time: 11/06/21 12:01 AM  Result  Value Ref Range   Troponin I (High Sensitivity) 11 <18 ng/L    Comment: (NOTE) Elevated high sensitivity troponin I (hsTnI) values and significant  changes across serial measurements may suggest ACS but many other  chronic and acute conditions are known to elevate hsTnI results.  Refer to the "Links" section for chest pain algorithms and additional  guidance. Performed at [facility], [address] [place], [place] [id number]   Procalcitonin - Baseline     Status: None   Collection Time: 11/06/21 12:01 AM  Result Value Ref Range   Procalcitonin <0.10 ng/mL    Comment:        Interpretation: PCT (Procalcitonin) <= 0.5 ng/mL: Systemic infection (sepsis) is not likely. Local bacterial infection is possible. (NOTE)       Sepsis PCT Algorithm           Lower Respiratory Tract                                      Infection PCT Algorithm    ----------------------------     ----------------------------         PCT < 0.25 ng/mL                PCT < 0.10 ng/mL          Strongly encourage             Strongly discourage   discontinuation of antibiotics    initiation of antibiotics    ----------------------------     -----------------------------       PCT 0.25 - 0.50 ng/mL            PCT 0.10 - 0.25 ng/mL               OR       >  80% decrease in PCT            Discourage initiation of                                            antibiotics      Encourage discontinuation           of antibiotics    ----------------------------     -----------------------------         PCT >= 0.50 ng/mL              PCT 0.26 - 0.50 ng/mL               AND        <80% decrease in PCT             Encourage initiation of                                             antibiotics       Encourage continuation           of antibiotics    ----------------------------     -----------------------------        PCT >= 0.50 ng/mL                  PCT > 0.50 ng/mL               AND         increase in PCT                   Strongly encourage                                      initiation of antibiotics    Strongly encourage escalation           of antibiotics                                     -----------------------------                                           PCT <= 0.25 ng/mL                                                 OR                                        > 80% decrease in PCT                                      Discontinue / Do not initiate  antibiotics  Performed at [facility], [address], [place], [place] [address]   CBC     Status: Abnormal   Collection Time: 11/06/21 12:01 AM  Result Value Ref Range   WBC 14.6 (H) 4.0 - 10.5 K/uL   RBC 3.54 (L) 4.22 - 5.81 MIL/uL   Hemoglobin 10.3 (L) 13.0 - 17.0 g/dL   HCT 60.4 (L) 54.0 - 98.1 %   MCV 101.1 (H) 80.0 - 100.0 fL   MCH 29.1 26.0 - 34.0 pg   MCHC 28.8 (L) 30.0 - 36.0 g/dL   RDW 19.1 47.8 - 29.5 %   Platelets 366 150 - 400 K/uL   nRBC 0.1 0.0 - 0.2 %    Comment: Performed at [facility], [address] [place], [place] [address]  Basic metabolic panel     Status: Abnormal   Collection Time: 11/06/21 12:01 AM  Result Value Ref Range   Sodium 142 135 - 145 mmol/L   Potassium 5.4 (H) 3.5 - 5.1 mmol/L   Chloride 106 98 - 111 mmol/L   CO2 30 22 - 32 mmol/L   Glucose, Bld 143 (H) 70 - 99 mg/dL    Comment: Glucose reference range applies only to samples taken after fasting for at least 8 hours.   BUN 17 8 - 23 mg/dL   Creatinine, Ser 8.65 0.61 - 1.24 mg/dL   Calcium 8.5 (L) 8.9 - 10.3 mg/dL   GFR, Estimated >78 >46 mL/min    Comment: (NOTE) Calculated using the CKD-EPI Creatinine Equation (2021)    Anion gap 6 5 - 15    Comment: Performed at [facility], [address], [place], [place] [address]  Hemoglobin A1c     Status: Abnormal   Collection Time: 11/06/21 12:01 AM  Result Value Ref Range   Hgb A1c MFr Bld 6.5 (H) 4.8 - 5.6 %    Comment:  (NOTE) Pre diabetes:          5.7%-6.4%  Diabetes:              >6.4%  Glycemic control for   <7.0% adults with diabetes    Mean Plasma Glucose 139.85 mg/dL    Comment: Performed at [facility], [address], [place], [place] [address]  I-Stat arterial blood gas, ED     Status: Abnormal   Collection Time: 11/06/21  2:41 AM  Result Value Ref Range   pH, Arterial 7.245 (L) 7.35 - 7.45   pCO2 arterial 75.8 (HH) 32 - 48 mmHg   pO2, Arterial 94 83 - 108 mmHg   Bicarbonate 32.8 (H) 20.0 - 28.0 mmol/L   TCO2 35 (H) 22 - 32 mmol/L   O2 Saturation 95 %   Acid-Base Excess 4.0 (H) 0.0 - 2.0 mmol/L   Sodium 141 135 - 145 mmol/L   Potassium 5.0 3.5 - 5.1 mmol/L   Calcium, Ion 1.24 1.15 - 1.40 mmol/L   HCT 33.0 (L) 39.0 - 52.0 %   Hemoglobin 11.2 (L) 13.0 - 17.0 g/dL   Collection site RADIAL, ALLEN'S TEST ACCEPTABLE    Drawn by RT    Sample type ARTERIAL    Comment NOTIFIED PHYSICIAN   CBG monitoring, ED     Status: Abnormal   Collection Time: 11/06/21  8:16 AM  Result Value Ref Range   Glucose-Capillary 145 (H) 70 - 99 mg/dL    Comment: Glucose reference range applies only to samples taken after fasting for  at least 8 hours.   DG Chest Port 1 View  Result Date: 11/05/2021 CLINICAL DATA:  Dyspnea EXAM: PORTABLE CHEST 1 VIEW COMPARISON:  10/28/2021 FINDINGS: Lungs are clear. No pneumothorax or pleural effusion. Coronary artery bypass grafting has been performed. Cardiac size within normal limits. Pulmonary vascularity is normal. IMPRESSION: No active disease. Electronically Signed   By: Helyn Numbers M.D.   On: 11/05/2021 21:16    Pending Labs Unresulted Labs (From admission, onward)     Start     Ordered   11/06/21 0600  Blood gas, arterial  (COPD / Pneumonia / Cellulitis / Lower Extremity Wound)  Once,   R        11/06/21 0005   11/06/21 0115  ABO/Rh  Once,   R        11/06/21 0115            Vitals/Pain Today's Vitals   11/06/21 0600 11/06/21 0705 11/06/21 0716  11/06/21 0734  BP: (!) 140/55 (!) 150/87    Pulse: 84 87    Resp: 17 (!) 22    Temp:   98.4 F (36.9 C)   TempSrc:      SpO2: 96% 98%  100%  Weight:      Height:      PainSc:        Isolation Precautions No active isolations  Medications Medications  methylPREDNISolone sodium succinate (SOLU-MEDROL) 40 mg/mL injection 40 mg (has no administration in time range)  doxycycline (VIBRAMYCIN) 100 mg in sodium chloride 0.9 % 250 mL IVPB (0 mg Intravenous Stopped 11/06/21 0313)  insulin aspart (novoLOG) injection 0-9 Units (has no administration in time range)  lactated ringers infusion ( Intravenous New Bag/Given 11/06/21 0038)  ipratropium-albuterol (DUONEB) 0.5-2.5 (3) MG/3ML nebulizer solution 3 mL (has no administration in time range)  methylPREDNISolone sodium succinate (SOLU-MEDROL) 125 mg/2 mL injection 125 mg (125 mg Intravenous Given 11/05/21 2114)  ceFEPIme (MAXIPIME) 2 g in sodium chloride 0.9 % 100 mL IVPB (0 g Intravenous Stopped 11/05/21 2149)  vancomycin (VANCOREADY) IVPB 2000 mg/400 mL (0 mg Intravenous Stopped 11/06/21 0004)    Mobility walks with device High fall risk   Focused Assessments Pulmonary Assessment Handoff:  Lung sounds:   O2 Device: Nasal Cannula O2 Flow Rate (L/min): 4 L/min    R Recommendations: See Admitting Provider Note  Report given to:   Additional Notes:

## 2021-11-06 NOTE — Progress Notes (Signed)
Same day note  Patient seen and examined at bedside.  Patient was admitted to the hospital for shortness of breath altered mental status.  At the time of my evaluation, patient complains of mild cough no chest pain.  Feels a little better was on BiPAP.  Physical examination reveals patient male, on nasal cannula oxygen, diminished breath sounds bilaterally, bilateral lower extremity chronic lymphedema with dry skin.  Laboratory data and imaging was reviewed  Assessment and Plan.  Acute on chronic respiratory failure with hypercapnia and hypoxia secondary to COPD exacerbation with ongoing tobacco use.  Initial ABG with acidosis of 7.132 and CO2 of 108.  Improved after BiPAP.  Patient is on 4 to 6 liters of oxygen at baseline.  Follows up with pulmonary at [facility] but has not seen them in 1 year.  PCCM has been consulted who recommended continuation of BiPAP.  Continue Solu-Medrol.  No obvious source of infection and the chest x-ray.  On doxycycline.  Procalcitonin was less than 0.1   AKI (acute kidney injury) (HCC) Creatinine elevated 1.26 from prior of 0.7.  Received IV fluids overnight, creatinine today at 1.1.  Lactate of 0.8.   Prolonged QT interval QTc of 506 on presentation..  Try to avoid QT prolongation medication.   S/P CABG x 3 Asymptomatic stable.  No chest pain.   Type 2 diabetes mellitus with hyperlipidemia (HCC) Hemoglobin A1c on 824 at 6.5.  Continue sliding scale insulin    History of pulmonary embolus (PE)-2011 with history of antiphospholipid antibody syndrome Continue warfarin.  INR of 2.3.  Therapeutic at this time    No Charge  Signed,  Tenny Craw, MD [facility]

## 2021-11-06 NOTE — Hospital Course (Addendum)
Jose [name] is a 66 y.o. male with medical history significant of CAD s/p CABG x3, mild aortic valve stenosis, peripheral vascular disease, OSA, COPD on 4 to 6 L noncompliant with trilogy, type 2 diabetes, hyperlipidemia, pulmonary embolism, antiphospholipid antibody syndrome on warfarin presents presented to hospital with altered mental status and was found to be cyanotic by EMS.  Patient reported increasing shortness of breath for last 2 weeks with cough and had stopped using his trilogy ventilator for at least 1 year due to loud noise.  He was compliant with bronchodilators.  He has relapsed into smoking as well.  In the ED patient was mildly hypotensive.  ABG showed pH of 7.132, CO2 of 108 and was placed on BiPAP.  After 2 hours, he had improvement to pH of 7.235, 77 CO2. PCCM was consulted for this.  Assessment and plan  Principal Problem:   Acute on chronic respiratory failure with hypercapnia (HCC) Active Problems:   History of pulmonary embolus (PE)-2011   Type 2 diabetes mellitus with hyperlipidemia (HCC)   S/P CABG x 3   Prolonged QT interval   Obesity (BMI 30-39.9)   AKI (acute kidney injury) (HCC)   COPD with acute exacerbation (HCC)

## 2021-11-06 NOTE — Progress Notes (Signed)
ANTICOAGULATION CONSULT NOTE - Initial Consult  Pharmacy Consult for Coumadin Indication: h/o VTE and antiphospholipid antibody syndrome  Allergies  Allergen Reactions   Melatonin Anxiety   Ramipril     cough    Patient Measurements: Height: 5\' 8"  (172.7 cm) Weight: 108.9 kg (240 lb) IBW/kg (Calculated) : 68.4  Vital Signs: Temp: 98.3 F (36.8 C) (08/24 0045) Temp Source: Axillary (08/24 0045) BP: 129/70 (08/24 0130) Pulse Rate: 80 (08/24 0130)  Labs: Recent Labs    11/05/21 2043 11/05/21 2052 11/05/21 2256 11/06/21 0001  HGB 10.5* 11.2* 10.9* 10.3*  HCT 36.9* 33.0* 32.0* 35.8*  PLT 395  --   --  366  LABPROT 24.7*  --   --   --   INR 2.3*  --   --   --   CREATININE 1.26*  --   --  1.16  TROPONINIHS 9  --   --  11    Estimated Creatinine Clearance: 75 mL/min (by C-G formula based on SCr of 1.16 mg/dL).   Medical History: Past Medical History:  Diagnosis Date   Antiphospholipid antibody syndrome [facility])    CAD S/P percutaneous coronary angioplasty '98, '04, Feb 2017   [facility]   Carotid arterial disease (HCC) 10/2015   moderate bilateral   COPD (chronic obstructive pulmonary disease) (HCC)    Coronary artery disease    Hypertension    Non-insulin treated type 2 diabetes mellitus (HCC)    Pulmonary emboli (HCC) 2011   RBBB    Sleep apnea     Medications:  No current facility-administered medications on file prior to encounter.   Current Outpatient Medications on File Prior to Encounter  Medication Sig Dispense Refill   albuterol (PROVENTIL) (5 MG/ML) 0.5% nebulizer solution Take 0.5 mLs (2.5 mg total) by nebulization every 6 (six) hours as needed for wheezing or shortness of breath. 20 mL 12   albuterol (VENTOLIN HFA) 108 (90 Base) MCG/ACT inhaler Inhale 2 puffs into the lungs every 6 (six) hours as needed for wheezing or shortness of breath.     aspirin EC 81 MG EC tablet Take 1 tablet (81 mg total) by mouth daily. Swallow whole. (Patient not taking:  Reported on 06/19/2021) 30 tablet 11   DULoxetine (CYMBALTA) 60 MG capsule Take 60 mg by mouth daily.     Fluticasone-Umeclidin-Vilant 200-62.5-25 MCG/INH AEPB Inhale 1 puff into the lungs daily. (Patient not taking: Reported on 06/19/2021)     gabapentin (NEURONTIN) 600 MG tablet Take 600 mg by mouth 3 (three) times daily.     glimepiride (AMARYL) 4 MG tablet Take 4 mg by mouth 2 (two) times daily.     ipratropium-albuterol (DUONEB) 0.5-2.5 (3) MG/3ML SOLN Take 3 mLs by nebulization every 4 (four) hours as needed.     metoprolol tartrate (LOPRESSOR) 12.5 mg TABS tablet Take 12.5 mg by mouth 2 (two) times daily.     nitroGLYCERIN (NITROSTAT) 0.4 MG SL tablet Place 0.4 mg under the tongue every 5 (five) minutes as needed for chest pain.   1   oxybutynin (DITROPAN) 5 MG tablet Take by mouth.     pantoprazole (PROTONIX) 40 MG tablet Take 40 mg by mouth 2 (two) times daily.     predniSONE (DELTASONE) 10 MG tablet 4 tabs po day1,2; 3 tabs po day3; 2 tabs po day4; 1 tab po day5; 1/2 tab po day6,7 (Patient not taking: Reported on 06/19/2021) 15 tablet 0   rosuvastatin (CRESTOR) 10 MG tablet Take 10 mg by mouth every  evening.  11   tamsulosin (FLOMAX) 0.4 MG CAPS capsule Take 0.8 mg by mouth daily.  0   tiZANidine (ZANAFLEX) 4 MG tablet Take 4 mg by mouth 3 (three) times daily as needed.     topiramate (TOPAMAX) 50 MG tablet Take 75 mg by mouth 2 (two) times daily.  0   torsemide (DEMADEX) 20 MG tablet Take 20 mg by mouth daily.     traZODone (DESYREL) 50 MG tablet Take 0.5 tablets (25 mg total) by mouth at bedtime as needed for sleep. 15 tablet 0   Vitamin D, Ergocalciferol, (DRISDOL) 1.25 MG (50000 UNIT) CAPS capsule Take 50,000 Units by mouth once a week.     warfarin (COUMADIN) 6 MG tablet Take 6 mg by mouth daily at 4 PM.       Assessment: 66 y.o. male admitted with AMS and respiratory distress, h/o PE and antiphospholipid antibody syndrome, to continue Coumadin  Goal of Therapy:  INR 2-3 Monitor  platelets by anticoagulation protocol: Yes   Plan:  F/U current home Coumadin regimen in am Daily INR  Deangelo Berns, Gary Fleet 11/06/2021,2:23 AM

## 2021-11-06 NOTE — ED Notes (Addendum)
Pt's wife, Galen Daft, updated with permission from patient. She requests update when patient is moved into a room out of the emergency department.   [phone]

## 2021-11-06 NOTE — Assessment & Plan Note (Signed)
QTc of 506. Avoid QT prolongation medication.

## 2021-11-06 NOTE — Progress Notes (Signed)
NAME:  Jose [name], MRN:  [id number], DOB:  [birth year], LOS: 1 ADMISSION DATE:  11/05/2021, CONSULTATION DATE: 11/05/2021 REFERRING MD: Dr. 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  He has apparently been on a trilogy ventilator at home since June 2022 followed in the fellows pulmonary clinic at [facility].  He was on CPAP prior to that.  Apparently poor compliance.  Supposed to be on oxygen at 6 L/min at home. He was brought to the emergency department after being found altered and cyanotic at home.  Initial ABG 7.1 3/108/114.  He was placed on BiPAP.  Mental status has been improving with NIPPV.  Other labs significant for mild leukocytosis, hyperkalemia, renal insufficiency, mildly elevated BNP.  Chest x-ray with prior median sternotomy, normal heart size, no infiltrates.  Pertinent  Medical History   Past Medical History:  Diagnosis Date   Antiphospholipid antibody syndrome (HCC)    CAD S/P percutaneous coronary angioplasty '98, '04, Feb 2017   [facility]   Carotid arterial disease (HCC) 10/2015   moderate bilateral   COPD (chronic obstructive pulmonary disease) (HCC)    Coronary artery disease    Hypertension    Non-insulin treated type 2 diabetes mellitus (HCC)    Pulmonary emboli (HCC) 2011   RBBB    Sleep apnea     Significant Hospital Events: Including procedures, antibiotic start and stop dates in addition to other pertinent events     Interim History / Subjective:  Awake and alert on 4 L nasal cannula  Objective   Blood pressure (!) 150/87, pulse 87, temperature 98.4 F (36.9 C), resp. rate (!) 22, height 5\' 8"  (1.727 m), weight 108.9 kg, SpO2 100 %.        Intake/Output Summary (Last 24 hours) at 11/06/2021 0843 Last data filed at  11/05/2021 2200 Gross per 24 hour  Intake --  Output 250 ml  Net -250 ml   Filed Weights   11/05/21 2029  Weight: 108.9 kg    Examination: General: Obese male no acute distress HEENT: MM pink/moist no JVD is appreciated Neuro: Grossly intact below strange affect CV: Heart sounds are regular PULM: Moderate wheezing currently on 4 L nasal cannula   GI: soft, bsx4 active  GU: Voids Extremities: warm/dry, 3+ edema  Skin: no rashes or lesions   Resolved Hospital Problem list     Assessment & Plan:  Acute on chronic hypercapnic and hypoxemic respiratory failure in patient with apparent acute exacerbation of COPD superimposed on OSA/OHS.  He is benefiting significantly from BiPAP, clinically improved.  ABG also improving.  No clear evidence for pneumonia by history or chest x-ray  Continue noninvasive mechanical ventilatory support nocturnally and as needed sleeping Avoid sedating medications Wean FiO2 to keep sats greater than 92% Agree with IV steroids transition to p.o. Continue bronchodilators Would simplify antimicrobial therapy with Zithromax for total 5 days for acute exacerbation of COPD Diuretics Stop smoking  Pulmonary critical care will be available as needed      Labs   CBC: Recent Labs  Lab 11/05/21 2043 11/05/21 2052 11/05/21 2256 11/06/21 0001 11/06/21 0241  WBC 12.3*  --   --  14.6*  --   NEUTROABS 8.1*  --   --   --   --   HGB 10.5* 11.2* 10.9* 10.3* 11.2*  HCT 36.9* 33.0* 32.0* 35.8*  33.0*  MCV 101.1*  --   --  101.1*  --   PLT 395  --   --  366  --     Basic Metabolic Panel: Recent Labs  Lab 11/05/21 2043 11/05/21 2052 11/05/21 2256 11/06/21 0001 11/06/21 0241  NA 143 143 141 142 141  K 5.0 4.8 5.0 5.4* 5.0  CL 104  --   --  106  --   CO2 33*  --   --  30  --   GLUCOSE 129*  --   --  143*  --   BUN 18  --   --  17  --   CREATININE 1.26*  --   --  1.16  --   CALCIUM 8.8*  --   --  8.5*  --    GFR: Estimated Creatinine  Clearance: 75 mL/min (by C-G formula based on SCr of 1.16 mg/dL). Recent Labs  Lab 11/05/21 2043 11/06/21 0001  PROCALCITON  --  <0.10  WBC 12.3* 14.6*  LATICACIDVEN 0.8  --     Liver Function Tests: Recent Labs  Lab 11/05/21 2043  AST 17  ALT 22  ALKPHOS 70  BILITOT 0.3  PROT 7.8  ALBUMIN 3.3*   No results for input(s): "LIPASE", "AMYLASE" in the last 168 hours. No results for input(s): "AMMONIA" in the last 168 hours.  ABG    Component Value Date/Time   PHART 7.245 (L) 11/06/2021 0241   PCO2ART 75.8 (HH) 11/06/2021 0241   PO2ART 94 11/06/2021 0241   HCO3 32.8 (H) 11/06/2021 0241   TCO2 35 (H) 11/06/2021 0241   O2SAT 95 11/06/2021 0241     Coagulation Profile: Recent Labs  Lab 11/05/21 2043  INR 2.3*    Cardiac Enzymes: No results for input(s): "CKTOTAL", "CKMB", "CKMBINDEX", "TROPONINI" in the last 168 hours.  HbA1C: Hgb A1c MFr Bld  Date/Time Value Ref Range Status  11/06/2021 12:01 AM 6.5 (H) 4.8 - 5.6 % Final    Comment:    (NOTE) Pre diabetes:          5.7%-6.4%  Diabetes:              >6.4%  Glycemic control for   <7.0% adults with diabetes   03/05/2021 06:54 AM 8.1 (H) 4.8 - 5.6 % Final    Comment:    (NOTE)         Prediabetes: 5.7 - 6.4         Diabetes: >6.4         Glycemic control for adults with diabetes: <7.0     CBG: Recent Labs  Lab 11/06/21 0816  GLUCAP 145*    Critical care time: N/A     Brett Canales Pamela Intrieri ACNP Acute Care Nurse Practitioner [facility] Pul[facility]/Critical Care Please consult Amion 11/06/2021, 8:44 AM

## 2021-11-06 NOTE — Assessment & Plan Note (Signed)
Asymptomatic stable

## 2021-11-06 NOTE — Assessment & Plan Note (Signed)
Creatinine elevated 1.26 from prior of 0.7.  Continuous IV fluid overnight.

## 2021-11-06 NOTE — Progress Notes (Signed)
ANTICOAGULATION CONSULT NOTE - Initial Consult  Pharmacy Consult for Coumadin Indication: h/o VTE and antiphospholipid antibody syndrome  Allergies  Allergen Reactions   Melatonin Anxiety   Altace [Ramipril] Cough    Patient Measurements: Height: 5\' 8"  (172.7 cm) Weight: 108.9 kg (240 lb) IBW/kg (Calculated) : 68.4  Vital Signs: Temp: 98.2 F (36.8 C) (08/24 0930) Temp Source: Oral (08/24 0930) BP: 137/63 (08/24 0930) Pulse Rate: 88 (08/24 0930)  Labs: Recent Labs    11/05/21 2043 11/05/21 2052 11/05/21 2256 11/06/21 0001 11/06/21 0241  HGB 10.5*   < > 10.9* 10.3* 11.2*  HCT 36.9*   < > 32.0* 35.8* 33.0*  PLT 395  --   --  366  --   LABPROT 24.7*  --   --   --   --   INR 2.3*  --   --   --   --   CREATININE 1.26*  --   --  1.16  --   TROPONINIHS 9  --   --  11  --    < > = values in this interval not displayed.     Estimated Creatinine Clearance: 75 mL/min (by C-G formula based on SCr of 1.16 mg/dL).   Assessment: 66 y.o. male admitted with AMS and respiratory distress, h/o PE and antiphospholipid antibody syndrome, to continue Coumadin  Home regimen - > 5 mg all days except 6 mg Monday and Friday (confirmed with patient, has 5 and 1 mg tablets at home)   Goal of Therapy:  INR 2-3 Monitor platelets by anticoagulation protocol: Yes   Plan:  Warfarin 5 mg po x 1 today Daily INR  Thank you Saturday, PharmD 11/06/2021,10:33 AM

## 2021-11-06 NOTE — Assessment & Plan Note (Signed)
-   Obtain hemoglobin A1c.  Placed on sensitive SSI

## 2021-11-06 NOTE — Assessment & Plan Note (Signed)
Noted  

## 2021-11-06 NOTE — Progress Notes (Signed)
Pt transported on Bipap from ED03 to ED16 with no complications.

## 2021-11-06 NOTE — ED Notes (Signed)
Patient is having some difficulties with breathing at rest. Nasal cannula was put on 4L. Pt states he is usually on 4L at home. Will continue to monitor.

## 2021-11-06 NOTE — Assessment & Plan Note (Signed)
Also with history of antiphospholipid syndrome. -Therapeutic today on presentation.  Continue warfarin per pharmacy.

## 2021-11-07 ENCOUNTER — Inpatient Hospital Stay (HOSPITAL_COMMUNITY): Payer: Medicare Other

## 2021-11-07 DIAGNOSIS — R5381 Other malaise: Secondary | ICD-10-CM

## 2021-11-07 DIAGNOSIS — N179 Acute kidney failure, unspecified: Secondary | ICD-10-CM | POA: Diagnosis not present

## 2021-11-07 DIAGNOSIS — J441 Chronic obstructive pulmonary disease with (acute) exacerbation: Secondary | ICD-10-CM | POA: Diagnosis not present

## 2021-11-07 DIAGNOSIS — Z86711 Personal history of pulmonary embolism: Secondary | ICD-10-CM | POA: Diagnosis not present

## 2021-11-07 DIAGNOSIS — R296 Repeated falls: Secondary | ICD-10-CM

## 2021-11-07 DIAGNOSIS — J9622 Acute and chronic respiratory failure with hypercapnia: Secondary | ICD-10-CM | POA: Diagnosis not present

## 2021-11-07 LAB — CBC WITH DIFFERENTIAL/PLATELET
Abs Immature Granulocytes: 0.08 10*3/uL — ABNORMAL HIGH (ref 0.00–0.07)
Basophils Absolute: 0 10*3/uL (ref 0.0–0.1)
Basophils Relative: 0 %
Eosinophils Absolute: 0 10*3/uL (ref 0.0–0.5)
Eosinophils Relative: 0 %
HCT: 34.3 % — ABNORMAL LOW (ref 39.0–52.0)
Hemoglobin: 10.2 g/dL — ABNORMAL LOW (ref 13.0–17.0)
Immature Granulocytes: 1 %
Lymphocytes Relative: 15 %
Lymphs Abs: 1.9 10*3/uL (ref 0.7–4.0)
MCH: 29.6 pg (ref 26.0–34.0)
MCHC: 29.7 g/dL — ABNORMAL LOW (ref 30.0–36.0)
MCV: 99.4 fL (ref 80.0–100.0)
Monocytes Absolute: 1.5 10*3/uL — ABNORMAL HIGH (ref 0.1–1.0)
Monocytes Relative: 11 %
Neutro Abs: 9.6 10*3/uL — ABNORMAL HIGH (ref 1.7–7.7)
Neutrophils Relative %: 73 %
Platelets: 367 10*3/uL (ref 150–400)
RBC: 3.45 MIL/uL — ABNORMAL LOW (ref 4.22–5.81)
RDW: 14.7 % (ref 11.5–15.5)
WBC: 13.1 10*3/uL — ABNORMAL HIGH (ref 4.0–10.5)
nRBC: 0.2 % (ref 0.0–0.2)

## 2021-11-07 LAB — BLOOD GAS, ARTERIAL
Acid-Base Excess: 11.1 mmol/L — ABNORMAL HIGH (ref 0.0–2.0)
Acid-Base Excess: 12.2 mmol/L — ABNORMAL HIGH (ref 0.0–2.0)
Bicarbonate: 41.8 mmol/L — ABNORMAL HIGH (ref 20.0–28.0)
Bicarbonate: 42.3 mmol/L — ABNORMAL HIGH (ref 20.0–28.0)
O2 Saturation: 99.4 %
O2 Saturation: 99 %
Patient temperature: 37.1
Patient temperature: 37.1
pCO2 arterial: 89 mmHg (ref 32–48)
pCO2 arterial: 84 mmHg (ref 32–48)
pH, Arterial: 7.28 — ABNORMAL LOW (ref 7.35–7.45)
pH, Arterial: 7.31 — ABNORMAL LOW (ref 7.35–7.45)
pO2, Arterial: 190 mmHg — ABNORMAL HIGH (ref 83–108)
pO2, Arterial: 113 mmHg — ABNORMAL HIGH (ref 83–108)

## 2021-11-07 LAB — BASIC METABOLIC PANEL
Anion gap: 6 (ref 5–15)
BUN: 16 mg/dL (ref 8–23)
CO2: 33 mmol/L — ABNORMAL HIGH (ref 22–32)
Calcium: 8.7 mg/dL — ABNORMAL LOW (ref 8.9–10.3)
Chloride: 103 mmol/L (ref 98–111)
Creatinine, Ser: 1.05 mg/dL (ref 0.61–1.24)
GFR, Estimated: >60 mL/min (ref >60)
Glucose, Bld: 163 mg/dL — ABNORMAL HIGH (ref 70–99)
Potassium: 4.3 mmol/L (ref 3.5–5.1)
Sodium: 142 mmol/L (ref 135–145)

## 2021-11-07 LAB — CBC
HCT: 35.5 % — ABNORMAL LOW (ref 39.0–52.0)
Hemoglobin: 10.4 g/dL — ABNORMAL LOW (ref 13.0–17.0)
MCH: 29 pg (ref 26.0–34.0)
MCHC: 29.3 g/dL — ABNORMAL LOW (ref 30.0–36.0)
MCV: 98.9 fL (ref 80.0–100.0)
Platelets: 392 10*3/uL (ref 150–400)
RBC: 3.59 MIL/uL — ABNORMAL LOW (ref 4.22–5.81)
RDW: 14.8 % (ref 11.5–15.5)
WBC: 13.4 10*3/uL — ABNORMAL HIGH (ref 4.0–10.5)
nRBC: 0.1 % (ref 0.0–0.2)

## 2021-11-07 LAB — GLUCOSE, CAPILLARY
Glucose-Capillary: 133 mg/dL — ABNORMAL HIGH (ref 70–99)
Glucose-Capillary: 167 mg/dL — ABNORMAL HIGH (ref 70–99)
Glucose-Capillary: 167 mg/dL — ABNORMAL HIGH (ref 70–99)
Glucose-Capillary: 134 mg/dL — ABNORMAL HIGH (ref 70–99)
Glucose-Capillary: 92 mg/dL (ref 70–99)

## 2021-11-07 LAB — MAGNESIUM: Magnesium: 2.4 mg/dL (ref 1.7–2.4)

## 2021-11-07 LAB — PROTIME-INR
INR: 2.3 — ABNORMAL HIGH (ref 0.8–1.2)
Prothrombin Time: 25.1 seconds — ABNORMAL HIGH (ref 11.4–15.2)

## 2021-11-07 MED ORDER — WARFARIN SODIUM 6 MG PO TABS
6.0000 mg | ORAL_TABLET | Freq: Once | ORAL | Status: AC
  Administered 2021-11-07: 6 mg via ORAL
  Filled 2021-11-07: qty 1

## 2021-11-07 MED ORDER — AZITHROMYCIN 250 MG PO TABS: 250.0000 mg | ORAL_TABLET | Freq: Every day | ORAL | 0 refills | Status: DC

## 2021-11-07 MED ORDER — PREDNISONE 10 MG (21) PO TBPK: ORAL_TABLET | ORAL | 0 refills | Status: DC

## 2021-11-07 NOTE — Progress Notes (Addendum)
PROGRESS NOTE    Jose [name]  MVH:[id number] DOB: 02-27-1956 DOA: 11/05/2021 PCP: [name], MD    Brief Narrative:    Jose [name] is a 66 y.o. 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  Patient reported increasing shortness of breath for last 2 weeks with cough and had stopped using his trilogy ventilator for at least 1 year due to loud noise.  He was compliant with bronchodilators.  He has relapsed into smoking as well.  In the ED, patient was mildly hypotensive.  ABG showed pH of 7.132, CO2 of 108 and was placed on BiPAP.  After 2 hours, he had improvement to pH of 7.235, 77 CO2. PCCM was consulted for this.  Patient was then admitted to hospital for further evaluation and treatment.   Assessment and Plan:  Principal Problem:   Acute on chronic respiratory failure with hypercapnia (HCC) Active Problems:   History of pulmonary embolus (PE)-2011   Type 2 diabetes mellitus with hyperlipidemia (HCC)   S/P CABG x 3   Prolonged QT interval   Obesity (BMI 30-39.9)   AKI (acute kidney injury) (HCC)   COPD with acute exacerbation (HCC)  Acute on chronic respiratory failure with hypercapnia and hypoxia secondary to COPD exacerbation with ongoing tobacco use.  Initial ABG with acidosis of 7.132 and CO2 of 108.  Improved after BiPAP.  Patient is on 4 to 6 liters of oxygen at baseline.  Follows up with pulmonary at [facility] but has not seen them in 1 year.  PCCM has been consulted who recommended continuation of BiPAP.  Received IV Solu-Medrol.  No obvious source of infection and the chest x-ray.  On doxycycline.  Procalcitonin was less than 0.1   AKI (acute  kidney injury) (HCC) Creatinine elevated 1.26 from prior of 0.7.  Received IV fluids overnight, creatinine today at 1.0.. Lactate of 0.8.   Prolonged QT interval QTc of 506 on presentation..  Try to avoid QT prolongation medication.   S/P CABG x 3 Asymptomatic stable.  No chest pain.   Type 2 diabetes mellitus with hyperlipidemia (HCC) Hemoglobin A1c on 8/24 at 6.5.  Continue sliding scale insulin    History of pulmonary embolus (PE)-2011 with history of antiphospholipid antibody syndrome Continue warfarin.  INR of 2.3.  Therapeutic at this time  Ambulatory dysfunction/recurrent falls.  History of falls at home.  We will get PT OT evaluation.  Patient wife has concerns about his safety at home patient has not taken a shower in almost a month and urinates all over at home.  Patient's wife is extremely concerned about his safety at home and would wish rehabilitation short-term if possible to improve his functional Status before coming home.    DVT prophylaxis:  warfarin (COUMADIN) tablet 6 mg   Code Status:     Code Status: Full Code  Disposition: Uncertain at this time, patient's wife is concerned about the safety at home.  We will get PT OT evaluation.  Might need rehabilitation.  Status is: Inpatient  Remains inpatient appropriate because: Respiratory failure, debility weakness falls   Family Communication: I had a prolonged discussion with the patient's spouse on the phone and updated her about the clinical condition of the  patient.  Consultants:  PCCM  Procedures:  BiPAP placement  Antimicrobials:  Doxycycline  Anti-infectives (From admission, onward)    Start     Dose/Rate Route Frequency Ordered Stop   11/07/21 0000  azithromycin (ZITHROMAX) 250 MG tablet        250 mg Oral Daily 11/07/21 1008 11/10/21 2359   11/06/21 2200  vancomycin (VANCOREADY) IVPB 1500 mg/300 mL  Status:  Discontinued        1,500 mg 150 mL/hr over 120 Minutes Intravenous Every 24 hours  11/05/21 2230 11/06/21 0003   11/06/21 0600  ceFEPIme (MAXIPIME) 2 g in sodium chloride 0.9 % 100 mL IVPB  Status:  Discontinued        2 g 200 mL/hr over 30 Minutes Intravenous Every 8 hours 11/05/21 2230 11/06/21 0003   11/06/21 0100  doxycycline (VIBRAMYCIN) 100 mg in sodium chloride 0.9 % 250 mL IVPB        100 mg 125 mL/hr over 120 Minutes Intravenous 2 times daily 11/06/21 0008     11/05/21 2045  ceFEPIme (MAXIPIME) 2 g in sodium chloride 0.9 % 100 mL IVPB        2 g 200 mL/hr over 30 Minutes Intravenous  Once 11/05/21 2040 11/05/21 2149   11/05/21 2045  vancomycin (VANCOREADY) IVPB 2000 mg/400 mL        2,000 mg 200 mL/hr over 120 Minutes Intravenous  Once 11/05/21 2040 11/06/21 0004       Subjective: Today, patient was seen and examined at bedside.  States that he cant bathe himself.  Breathing okay though.  His wife is concerned about coming home and was tearful throughout that he was urinating himself at home and continues to fall at home multiple times and she is concerned extremely about his safety..  Objective: Vitals:   11/07/21 0057 11/07/21 0413 11/07/21 0651 11/07/21 0736  BP: (!) 163/91 (!) 167/80 (!) 150/77 (!) 140/63  Pulse: 90 88 90   Resp: 20 20 (!) 21   Temp:  98.8 F (37.1 C) 99.1 F (37.3 C) 98.7 F (37.1 C)  TempSrc:  Oral Oral Axillary  SpO2: 93% 97% 98%   Weight:      Height:        Intake/Output Summary (Last 24 hours) at 11/07/2021 1336 Last data filed at 11/07/2021 0900 Gross per 24 hour  Intake 737.76 ml  Output 1200 ml  Net -462.24 ml   Filed Weights   11/05/21 2029  Weight: 108.9 kg    Physical Examination: Body mass index is 36.49 kg/m.   General: Obese built, not in obvious distress, on nasal cannula oxygen HENT:   No scleral pallor or icterus noted. Oral mucosa is moist.  Chest:  Diminished breath sounds bilaterally.  CVS: S1 &S2 heard. No murmur.  Regular rate and rhythm. Abdomen: Soft, nontender, nondistended.  Bowel sounds  are heard.   Extremities: No cyanosis, clubbing or edema.  Peripheral pulses are palpable. Psych: Alert, awake and oriented, normal mood CNS:  No cranial nerve deficits.  Moves all extremities. Skin: Warm and dry.  No rashes noted.  Data Reviewed:   CBC: Recent Labs  Lab 11/05/21 2043 11/05/21 2052 11/05/21 2256 11/06/21 0001 11/06/21 0241 11/07/21 0157  WBC 12.3*  --   --  14.6*  --  13.4*  NEUTROABS 8.1*  --   --   --   --   --   HGB 10.5* 11.2* 10.9* 10.3* 11.2* 10.4*  HCT 36.9* 33.0* 32.0* 35.8*  33.0* 35.5*  MCV 101.1*  --   --  101.1*  --  98.9  PLT 395  --   --  366  --  392    Basic Metabolic Panel: Recent Labs  Lab 11/05/21 2043 11/05/21 2052 11/05/21 2256 11/06/21 0001 11/06/21 0241 11/07/21 0157  NA 143 143 141 142 141 142  K 5.0 4.8 5.0 5.4* 5.0 4.3  CL 104  --   --  106  --  103  CO2 33*  --   --  30  --  33*  GLUCOSE 129*  --   --  143*  --  163*  BUN 18  --   --  17  --  16  CREATININE 1.26*  --   --  1.16  --  1.05  CALCIUM 8.8*  --   --  8.5*  --  8.7*  MG  --   --   --   --   --  2.4    Liver Function Tests: Recent Labs  Lab 11/05/21 2043  AST 17  ALT 22  ALKPHOS 70  BILITOT 0.3  PROT 7.8  ALBUMIN 3.3*     Radiology Studies: DG Chest Port 1 View  Result Date: 11/05/2021 CLINICAL DATA:  Dyspnea EXAM: PORTABLE CHEST 1 VIEW COMPARISON:  10/28/2021 FINDINGS: Lungs are clear. No pneumothorax or pleural effusion. Coronary artery bypass grafting has been performed. Cardiac size within normal limits. Pulmonary vascularity is normal. IMPRESSION: No active disease. Electronically Signed   By: Helyn Numbers M.D.   On: 11/05/2021 21:16      LOS: 2 days    Joycelyn Das, MD [facility] Available via Epic secure chat 7am-7pm After these hours, please refer to coverage provider listed on [email or web address] 11/07/2021, 1:36 PM

## 2021-11-07 NOTE — Progress Notes (Signed)
Patient was noted lethargic and not arousable to touch or name calling. Pt skin cool to touch crackles and rhonchi noted to bi-lateral lobes. On call provider was contacted with patient's condition. STAT labs and X-ray were ordered. Patient placed on bi-pap and Telemetry monitoring was restarted. See mar for order details.

## 2021-11-07 NOTE — NC FL2 (Signed)
[place] MEDICAID FL2 LEVEL OF CARE SCREENING TOOL     IDENTIFICATION  Patient Name: Jose [name] Birthdate: 1955-06-05 Sex: male Admission Date (Current Location): 11/05/2021  [facility] and [place] Number:  Producer, television/film/video and Address:  The . [facility], [address] [place], [place] [address]      Provider Number: [id number]  Attending Physician Name and Address:  Joycelyn Das, MD  Relative Name and Phone Number:  Allyn Kenner   [phone]    Current Level of Care: Hospital Recommended Level of Care: Skilled Nursing Facility Prior Approval Number:    Date Approved/Denied:   PASRR Number: [id number]  Discharge Plan: SNF    Current Diagnoses: Patient Active Problem List   Diagnosis Date Noted   COPD with acute exacerbation (HCC) 11/06/2021   Acute on chronic respiratory failure with hypercapnia (HCC) 11/05/2021   AKI (acute kidney injury) (HCC) 11/05/2021   Obesity (BMI 30-39.9)    Benign prostatic hyperplasia without lower urinary tract symptoms    COPD exacerbation (HCC) 07/13/2020   Prolonged QT interval 07/13/2020   Acute on chronic respiratory failure with hypoxia (HCC) 07/13/2020   Chronic diastolic CHF (congestive heart failure) (HCC) 07/13/2020   Community acquired pneumonia 01/18/2020   Coronary atherosclerosis 10/04/2017   GERD (gastroesophageal reflux disease) 10/04/2017   Essential hypertension 10/04/2017   Transient visual loss 10/04/2017   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   Nonrheumatic aortic valve stenosis 03/31/2017   Pericarditis, Acute 02/12/2017   Acute on chronic right heart failure (HCC) 02/11/2017   Angina at rest [facility]) 08/17/2016   Unstable angina (HCC) 08/16/2016   Tobacco abuse 08/16/2016   Antiphospholipid syndrome (HCC) 08/16/2016   Hyperlipidemia 08/16/2016   Essential hypertension, benign 08/16/2016   COPD (chronic obstructive pulmonary disease) (HCC) 08/16/2016   Sleep apnea 08/16/2016   CAD S/P  multiple PCIs 08/16/2016   History of pulmonary embolus (PE)-2011 08/16/2016   Chronic anticoagulation-Couamdin 08/16/2016   Type 2 diabetes mellitus with hyperlipidemia (HCC) 08/16/2016   Carotid artery disease (HCC) 08/16/2016   RBBB (right bundle branch block) 08/16/2016   Dyspnea on exertion 10/17/2015   ED (erectile dysfunction) 08/09/2012    Orientation RESPIRATION BLADDER Height & Weight     Self, Situation, Place  O2 Continent Weight: 240 lb (108.9 kg) Height:  5\' 8"  (172.7 cm)  BEHAVIORAL SYMPTOMS/MOOD NEUROLOGICAL BOWEL NUTRITION STATUS      Continent Diet (see discharge summary)  AMBULATORY STATUS COMMUNICATION OF NEEDS Skin   Limited Assist Verbally Normal                       Personal Care Assistance Level of Assistance  Bathing, Feeding, Dressing Bathing Assistance: Limited assistance Feeding assistance: Independent Dressing Assistance: Limited assistance     Functional Limitations Info  Sight, Hearing, Speech Sight Info: Adequate Hearing Info: Adequate Speech Info: Adequate    SPECIAL CARE FACTORS FREQUENCY  PT (By licensed PT), OT (By licensed OT)     PT Frequency: 5x week OT Frequency: 5x week            Contractures Contractures Info: Not present    Additional Factors Info  Code Status, Allergies, Insulin Sliding Scale Code Status Info: full Allergies Info: Melatonin, Altace (Ramipril)   Insulin Sliding Scale Info: novolog: see discharge summary       Current Medications (11/07/2021):  This is the current hospital active medication list Current Facility-Administered Medications  Medication Dose Route Frequency Provider Last Rate Last  Admin   doxycycline (VIBRAMYCIN) 100 mg in sodium chloride 0.9 % 250 mL IVPB  100 mg Intravenous BID Tu, Ching T, DO 125 mL/hr at 11/07/21 1051 100 mg at 11/07/21 1051   insulin aspart (novoLOG) injection 0-9 Units  0-9 Units Subcutaneous TID WC Tu, Ching T, DO   2 Units at 11/07/21 1240    ipratropium-albuterol (DUONEB) 0.5-2.5 (3) MG/3ML nebulizer solution 3 mL  3 mL Nebulization Q6H PRN Tu, Ching T, DO   3 mL at 11/07/21 7035   methylPREDNISolone sodium succinate (SOLU-MEDROL) 40 mg/mL injection 40 mg  40 mg Intravenous Daily Tu, Ching T, DO   40 mg at 11/07/21 0093   warfarin (COUMADIN) tablet 6 mg  6 mg Oral ONCE-1600 Pokhrel, Laxman, MD       Warfarin - Pharmacist Dosing Inpatient   Does not apply q1600 Tu, Ching T, DO         Discharge Medications: Please see discharge summary for a list of discharge medications.  Relevant Imaging Results:  Relevant Lab Results:   Additional Information SSN: [id number]  Lorri Frederick, LCSW

## 2021-11-07 NOTE — Evaluation (Addendum)
Physical Therapy Evaluation Patient Details Name: Jose [name] MRN: [id number] DOB: [birth year] Today's Date: 11/07/2021  History of Present Illness  Pt is a 66 y/o M presenting with AMS and respiratory distress. PMH includes CAD s/p CABG x3, mild aortic valve stenosis, peripheral vascular disease, OSA, COPD on 4 to 6 L noncompliant with trilogy, type 2 diabetes, hyperlipidemia, PE, antiphospholipid antibody syndrome   Clinical Impression  Received pt sitting in recliner, pt agreeable to PT evaluation, but very lethargic throughout. Pt on 4L O2 with SPO2 98-99% during session. Pt politely refused ambulation but was able to stand with RW and close supervision. Pt then reported "syncopal" episode coming on and returned to sitting - BP: 159/81. Attempted to get BP in standing, however pt politely refused. Pt would benefit from continued therapy to address strength/endurance deficits. Acute PT to cont to follow.      Recommendations for follow up therapy are one component of a multi-disciplinary discharge planning process, led by the attending physician.  Recommendations may be updated based on patient status, additional functional criteria and insurance authorization.  Follow Up Recommendations Home health PT (vs SNF)      Assistance Recommended at Discharge Intermittent Supervision/Assistance  Patient can return home with the following  A little help with walking and/or transfers;A little help with bathing/dressing/bathroom;Assistance with cooking/housework;Assist for transportation;Help with stairs or ramp for entrance    Equipment Recommendations Other (comment) (pt reports already having RW)  Recommendations for Other Services       Functional Status Assessment Patient has had a recent decline in their functional status and demonstrates the ability to make significant improvements in function in a reasonable and predictable amount of time.     Precautions / Restrictions  Precautions Precautions: Fall Precaution Comments: monitor O2 Restrictions Weight Bearing Restrictions: No      Mobility  Bed Mobility               General bed mobility comments: pt sitting in reciler and start and end of session Patient Response:  (lethargic)  Transfers Overall transfer level: Needs assistance Equipment used: Rolling walker (2 wheels) Transfers: Sit to/from Stand Sit to Stand: Supervision           General transfer comment: stood from recliner with RW and supervision    Ambulation/Gait               General Gait Details: pt refused ambulation  Stairs            Wheelchair Mobility    Modified Rankin (Stroke Patients Only)       Balance Overall balance assessment: Needs assistance         Standing balance support: Bilateral upper extremity supported, Reliant on assistive device for balance (RW) Standing balance-[name] Scale: Fair Standing balance comment: pt able to maintain static standing balance with supervision                             Pertinent Vitals/Pain Pain Assessment Pain Assessment: No/denies pain    Home Living Family/patient expects to be discharged to:: Private residence Living Arrangements: Spouse/significant other Available Help at Discharge: Available PRN/intermittently;Family (wife works) Type of Home: Mobile home Home Access: Stairs to enter Entrance Stairs-Rails: Right Entrance Stairs-Number of Steps: 4 + 6   Home Layout: One level Home Equipment: Agricultural consultant (2 wheels);Rollator (4 wheels);Cane - single point      Prior Function Prior Level of Function : Needs assist  Physical Assist : Mobility (physical);ADLs (physical) Mobility (physical): Gait   Mobility Comments: pt reports he was furniture walking and wife needed to assist intermittently       Hand Dominance   Dominant Hand: Left    Extremity/Trunk Assessment   Upper Extremity Assessment Upper  Extremity Assessment: Defer to OT evaluation    Lower Extremity Assessment Lower Extremity Assessment: Generalized weakness    Cervical / Trunk Assessment Cervical / Trunk Assessment: Normal  Communication   Communication: No difficulties  Cognition Arousal/Alertness: Lethargic Behavior During Therapy: WFL for tasks assessed/performed Overall Cognitive Status: No family/caregiver present to determine baseline cognitive functioning                                 General Comments: appeared lethargic and question accurracy of how much assist pt required from wife - need to confirm with wife        General Comments General comments (skin integrity, edema, etc.): pt on 4L O2 with SPO2 98-99% during session. Pt appeared lethargic, keeping eyes closed during majority of session. After standing, pt reported feeling "syncopal" episode coming on however BP: 169/81. Attempted to get standing BP, however pt politely declined any further particpation.    Exercises     Assessment/Plan    PT Assessment Patient needs continued PT services  PT Problem List Decreased strength;Decreased activity tolerance;Decreased balance;Decreased mobility;Decreased coordination;Cardiopulmonary status limiting activity;Obesity       PT Treatment Interventions DME instruction;Gait training;Stair training;Functional mobility training;Therapeutic activities;Therapeutic exercise;Patient/family education;Neuromuscular re-education;Balance training    PT Goals (Current goals can be found in the Care Plan section)  Acute Rehab PT Goals Patient Stated Goal: did not state PT Goal Formulation: With patient Time For Goal Achievement: 11/21/21 Potential to Achieve Goals: Good    Frequency Min 3X/week     Co-evaluation               AM-PAC PT "6 Clicks" Mobility  Outcome Measure Help needed turning from your back to your side while in a flat bed without using bedrails?: A Little Help needed  moving from lying on your back to sitting on the side of a flat bed without using bedrails?: A Little Help needed moving to and from a bed to a chair (including a wheelchair)?: A Little Help needed standing up from a chair using your arms (e.g., wheelchair or bedside chair)?: A Little Help needed to walk in hospital room?: A Little Help needed climbing 3-5 steps with a railing? : A Lot 6 Click Score: 17    End of Session Equipment Utilized During Treatment: Oxygen Activity Tolerance: Patient limited by lethargy Patient left: in chair;with call bell/phone within reach Nurse Communication: Mobility status PT Visit Diagnosis: Unsteadiness on feet (R26.81);Other abnormalities of gait and mobility (R26.89);Muscle weakness (generalized) (M62.81)    Time: 5009-3818 PT Time Calculation (min) (ACUTE ONLY): 16 min   Charges:   PT Evaluation $PT Eval Low Complexity: 1 Low          Raechel Chute PT, DPT  Alfonso Patten 11/07/2021, 1:56 PM

## 2021-11-07 NOTE — Care Management Important Message (Signed)
Important Message  Patient Details  Name: Jose [name] MRN: [id number] Date of Birth: 01/12/1956   Medicare Important Message Given:  Yes     Sherilyn Banker 11/07/2021, 12:12 PM

## 2021-11-07 NOTE — TOC Initial Note (Signed)
Transition of Care [facility]) - Initial/Assessment Note    Patient Details  Name: Jose [name] MRN: [id number] Date of Birth: 1955/07/26  [facility]) CM/SW Contact:    Epifanio Lesches, RN Phone Number: 11/07/2021, 2:04 PM  Clinical Narrative:     Admitted with AMS and respiratory distress. From home with wife.             NCM spoke with wife regarding d/c planning after receiving consent from pt.  Orders noted for home health services. Pt initially agreeable to home health services without a provider preference. Referral made with [facility] and accepted. However, wife states pt is weak and has fallen several times @ home, 2-3x/week. States pt is "dead weight"and she has to call the fire department to get him up from the floor. States she has back pain and can't help pt. States pt hasn't had a bath for 6 weeks. States he can't control his stream while urinating. States she needs some help and is open to pt going into a SNF for rehab. Preference : [facility].MD made aware of above information. MD to call and speak with wife. PT/OT evaluations pending....  TOC team following and will continue to assist with needs.  Expected Discharge Plan: Home w Home Health Services (vs SNF) Barriers to Discharge: No Barriers Identified   Patient Goals and CMS Choice     Choice offered to / list presented to : Patient, Spouse  Expected Discharge Plan and Services Expected Discharge Plan: Home w Home Health Services (vs SNF)   Discharge Planning Services: CM Consult Post Acute Care Choice: Durable Medical Equipment (bipap,oxygen, nebulizer, cane ,walker) Living arrangements for the past 2 months: Mobile Home Expected Discharge Date: 11/07/21                         HH Arranged: RN, Nurse's Aide HH Agency: [facility] Date HH Agency Contacted: 11/07/21 Time HH Agency Contacted: 1022 Representative spoke with at [facility] Agency: Morrie Sheldon  Prior Living  Arrangements/Services Living arrangements for the past 2 months: Mobile Home Lives with:: Spouse Patient language and need for interpreter reviewed:: Yes Do you feel safe going back to the place where you live?: Yes      Need for Family Participation in Patient Care: Yes (Comment) Care giver support system in place?: No (comment)   Criminal Activity/Legal Involvement Pertinent to Current Situation/Hospitalization: No - Comment as needed  Activities of Daily Living Home Assistive Devices/Equipment: Shower chair without back, Oxygen, Other (Comment) (Trilogy) ADL Screening (condition at time of admission) Patient's cognitive ability adequate to safely complete daily activities?: Yes Is the patient deaf or have difficulty hearing?: No Does the patient have difficulty seeing, even when wearing glasses/contacts?: Yes Does the patient have difficulty concentrating, remembering, or making decisions?: Yes Patient able to express need for assistance with ADLs?: Yes Does the patient have difficulty dressing or bathing?: Yes Independently performs ADLs?: No Communication: Independent Dressing (OT): Needs assistance Is this a change from baseline?: Pre-admission baseline Grooming: Needs assistance Is this a change from baseline?: Pre-admission baseline Feeding: Independent Bathing: Needs assistance Is this a change from baseline?: Pre-admission baseline Toileting: Needs assistance Is this a change from baseline?: Pre-admission baseline In/Out Bed: Needs assistance Is this a change from baseline?: Pre-admission baseline Walks in Home: Needs assistance Is this a change from baseline?: Pre-admission baseline Does the patient have difficulty walking or climbing stairs?: Yes Weakness of Legs: Both Weakness  of Arms/Hands: Both  Permission Sought/Granted   Permission granted to share information with : Yes, Verbal Permission Granted  Share Information with NAME: Harvest Dark  Spouse   [phone]           Emotional Assessment Appearance:: Appears stated age Attitude/Demeanor/Rapport: Engaged Affect (typically observed): Accepting Orientation: : Oriented to Self, Oriented to Place, Oriented to  Time, Oriented to Situation Alcohol / Substance Use: Not Applicable Psych Involvement: No (comment)  Admission diagnosis:  Hypercapnia [R06.89] Acute on chronic respiratory failure with hypercapnia (HCC) [J96.22] Altered mental status, unspecified altered mental status type [R41.82] Patient Active Problem List   Diagnosis Date Noted   COPD with acute exacerbation (HCC) 11/06/2021   Acute on chronic respiratory failure with hypercapnia (HCC) 11/05/2021   AKI (acute kidney injury) (HCC) 11/05/2021   Obesity (BMI 30-39.9)    Benign prostatic hyperplasia without lower urinary tract symptoms    COPD exacerbation (HCC) 07/13/2020   Prolonged QT interval 07/13/2020   Acute on chronic respiratory failure with hypoxia (HCC) 07/13/2020   Chronic diastolic CHF (congestive heart failure) (HCC) 07/13/2020   Community acquired pneumonia 01/18/2020   Coronary atherosclerosis 10/04/2017   GERD (gastroesophageal reflux disease) 10/04/2017   Essential hypertension 10/04/2017   Transient visual loss 10/04/2017   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   Nonrheumatic aortic valve stenosis 03/31/2017   Pericarditis, Acute 02/12/2017   Acute on chronic right heart failure (HCC) 02/11/2017   Angina at rest [facility]) 08/17/2016   Unstable angina (HCC) 08/16/2016   Tobacco abuse 08/16/2016   Antiphospholipid syndrome (HCC) 08/16/2016   Hyperlipidemia 08/16/2016   Essential hypertension, benign 08/16/2016   COPD (chronic obstructive pulmonary disease) (HCC) 08/16/2016   Sleep apnea 08/16/2016   CAD S/P multiple PCIs 08/16/2016   History of pulmonary embolus (PE)-2011 08/16/2016   Chronic anticoagulation-Couamdin 08/16/2016   Type 2 diabetes mellitus with hyperlipidemia (HCC) 08/16/2016   Carotid artery  disease (HCC) 08/16/2016   RBBB (right bundle branch block) 08/16/2016   Dyspnea on exertion 10/17/2015   ED (erectile dysfunction) 08/09/2012   PCP:  [name], MD Pharmacy:   [facility] [address] [facility] - [place], [place] - [address] AT [facility] [address] [place] [address] Phone: [phone] Fax: [phone]     Social Determinants of Health (SDOH) Interventions    Readmission Risk Interventions    07/16/2020   12:39 PM  Readmission Risk Prevention Plan  Transportation Screening Complete  PCP or Specialist Appt within 3-5 Days Complete  HRI or Home Care Consult Complete  Social Work Consult for Recovery Care Planning/Counseling Complete  Palliative Care Screening Not Applicable  Medication Review Oceanographer) Complete

## 2021-11-07 NOTE — Progress Notes (Signed)
ANTICOAGULATION CONSULT NOTE  Pharmacy Consult for Warfarin Indication:  hx VTE and Antiphospholipid antibody syndrome  Allergies  Allergen Reactions   Melatonin Anxiety   Altace [Ramipril] Cough    Patient Measurements: Height: 5\' 8"  (172.7 cm) Weight: 108.9 kg (240 lb) IBW/kg (Calculated) : 68.4  Vital Signs: Temp: 98.7 F (37.1 C) (08/25 0736) Temp Source: Axillary (08/25 0736) BP: 140/63 (08/25 0736) Pulse Rate: 90 (08/25 0651)  Labs: Recent Labs    11/05/21 2043 11/05/21 2052 11/06/21 0001 11/06/21 0241 11/07/21 0157 11/07/21 1154  HGB 10.5*   < > 10.3* 11.2* 10.4*  --   HCT 36.9*   < > 35.8* 33.0* 35.5*  --   PLT 395  --  366  --  392  --   LABPROT 24.7*  --   --   --   --  25.1*  INR 2.3*  --   --   --   --  2.3*  CREATININE 1.26*  --  1.16  --  1.05  --   TROPONINIHS 9  --  11  --   --   --    < > = values in this interval not displayed.    Estimated Creatinine Clearance: 82.8 mL/min (by C-G formula based on SCr of 1.05 mg/dL).   Assessment: 66 y.o. M with medical history significant for prior PE and antiphospholipid antibody syndrome on Warfarin PTA who presented with AMS and respiratory distress. Pharmacy consulted to manage warfarin. INR therapeutic 2.3. No issues with bleeding reported. CBC stable.   PTA warfarin regimen: 5 mg all days except 6 mg Monday and Friday  (confirmed with patient, has 5 and 1 mg tablets at home)  Goal of Therapy:  INR 2-3 Monitor platelets by anticoagulation protocol: Yes   Plan:  Give warfarin 6 mg PO x1 dose Check INR daily while on warfarin Continue to monitor H&H and platelets    Thank you for allowing pharmacy to be a part of this patient's care.  Monday, PharmD Clinical Pharmacist

## 2021-11-08 DIAGNOSIS — J441 Chronic obstructive pulmonary disease with (acute) exacerbation: Secondary | ICD-10-CM | POA: Diagnosis not present

## 2021-11-08 DIAGNOSIS — Z86711 Personal history of pulmonary embolism: Secondary | ICD-10-CM | POA: Diagnosis not present

## 2021-11-08 DIAGNOSIS — N179 Acute kidney failure, unspecified: Secondary | ICD-10-CM | POA: Diagnosis not present

## 2021-11-08 DIAGNOSIS — J9622 Acute and chronic respiratory failure with hypercapnia: Secondary | ICD-10-CM | POA: Diagnosis not present

## 2021-11-08 LAB — CBC
HCT: 32.3 % — ABNORMAL LOW (ref 39.0–52.0)
Hemoglobin: 9.3 g/dL — ABNORMAL LOW (ref 13.0–17.0)
MCH: 28.8 pg (ref 26.0–34.0)
MCHC: 28.8 g/dL — ABNORMAL LOW (ref 30.0–36.0)
MCV: 100 fL (ref 80.0–100.0)
Platelets: 354 10*3/uL (ref 150–400)
RBC: 3.23 MIL/uL — ABNORMAL LOW (ref 4.22–5.81)
RDW: 14.6 % (ref 11.5–15.5)
WBC: 11 10*3/uL — ABNORMAL HIGH (ref 4.0–10.5)
nRBC: 0 % (ref 0.0–0.2)

## 2021-11-08 LAB — BASIC METABOLIC PANEL
Anion gap: 4 — ABNORMAL LOW (ref 5–15)
BUN: 24 mg/dL — ABNORMAL HIGH (ref 8–23)
CO2: 37 mmol/L — ABNORMAL HIGH (ref 22–32)
Calcium: 8.9 mg/dL (ref 8.9–10.3)
Chloride: 104 mmol/L (ref 98–111)
Creatinine, Ser: 1.07 mg/dL (ref 0.61–1.24)
GFR, Estimated: >60 mL/min (ref >60)
Glucose, Bld: 88 mg/dL (ref 70–99)
Potassium: 5.1 mmol/L (ref 3.5–5.1)
Sodium: 145 mmol/L (ref 135–145)

## 2021-11-08 LAB — MAGNESIUM: Magnesium: 2 mg/dL (ref 1.7–2.4)

## 2021-11-08 LAB — GLUCOSE, CAPILLARY
Glucose-Capillary: 77 mg/dL (ref 70–99)
Glucose-Capillary: 93 mg/dL (ref 70–99)
Glucose-Capillary: 79 mg/dL (ref 70–99)
Glucose-Capillary: 120 mg/dL — ABNORMAL HIGH (ref 70–99)
Glucose-Capillary: 195 mg/dL — ABNORMAL HIGH (ref 70–99)
Glucose-Capillary: 208 mg/dL — ABNORMAL HIGH (ref 70–99)

## 2021-11-08 LAB — PROTIME-INR
INR: 2.3 — ABNORMAL HIGH (ref 0.8–1.2)
Prothrombin Time: 24.8 seconds — ABNORMAL HIGH (ref 11.4–15.2)

## 2021-11-08 MED ORDER — MAGNESIUM OXIDE -MG SUPPLEMENT 400 (240 MG) MG PO TABS
400.0000 mg | ORAL_TABLET | Freq: Two times a day (BID) | ORAL | Status: DC
  Administered 2021-11-08 – 2021-11-11 (×7): 400 mg via ORAL
  Filled 2021-11-08 (×7): qty 1

## 2021-11-08 MED ORDER — UMECLIDINIUM BROMIDE 62.5 MCG/ACT IN AEPB
1.0000 | INHALATION_SPRAY | Freq: Every day | RESPIRATORY_TRACT | Status: DC
  Administered 2021-11-09 – 2021-11-11 (×3): 1 via RESPIRATORY_TRACT
  Filled 2021-11-08: qty 7

## 2021-11-08 MED ORDER — METOPROLOL TARTRATE 12.5 MG HALF TABLET
12.5000 mg | ORAL_TABLET | Freq: Two times a day (BID) | ORAL | Status: DC
  Administered 2021-11-08 – 2021-11-11 (×7): 12.5 mg via ORAL
  Filled 2021-11-08 (×7): qty 1

## 2021-11-08 MED ORDER — ACETAMINOPHEN 325 MG PO TABS: 650.0000 mg | ORAL_TABLET | Freq: Three times a day (TID) | ORAL | Status: DC | PRN

## 2021-11-08 MED ORDER — PANTOPRAZOLE SODIUM 40 MG PO TBEC
40.0000 mg | DELAYED_RELEASE_TABLET | Freq: Two times a day (BID) | ORAL | Status: DC
  Administered 2021-11-08 – 2021-11-11 (×7): 40 mg via ORAL
  Filled 2021-11-08 (×7): qty 1

## 2021-11-08 MED ORDER — WARFARIN SODIUM 5 MG PO TABS
5.0000 mg | ORAL_TABLET | Freq: Once | ORAL | Status: AC
  Administered 2021-11-08: 5 mg via ORAL
  Filled 2021-11-08: qty 1

## 2021-11-08 MED ORDER — SALINE SPRAY 0.65 % NA SOLN: 1.0000 | NASAL | Status: DC | PRN

## 2021-11-08 MED ORDER — TORSEMIDE 20 MG PO TABS
20.0000 mg | ORAL_TABLET | Freq: Every day | ORAL | Status: DC
  Administered 2021-11-08 – 2021-11-11 (×4): 20 mg via ORAL
  Filled 2021-11-08 (×4): qty 1

## 2021-11-08 MED ORDER — FLUTICASONE FUROATE-VILANTEROL 200-25 MCG/ACT IN AEPB
1.0000 | INHALATION_SPRAY | Freq: Every day | RESPIRATORY_TRACT | Status: DC
  Administered 2021-11-09 – 2021-11-11 (×3): 1 via RESPIRATORY_TRACT
  Filled 2021-11-08: qty 28

## 2021-11-08 MED ORDER — DULOXETINE HCL 60 MG PO CPEP: 60.0000 mg | ORAL_CAPSULE | Freq: Every day | ORAL | Status: DC

## 2021-11-08 MED ORDER — ROSUVASTATIN CALCIUM 5 MG PO TABS
10.0000 mg | ORAL_TABLET | Freq: Every evening | ORAL | Status: DC
  Administered 2021-11-08 – 2021-11-10 (×3): 10 mg via ORAL
  Filled 2021-11-08 (×4): qty 2

## 2021-11-08 MED ORDER — TOPIRAMATE 25 MG PO TABS
75.0000 mg | ORAL_TABLET | Freq: Two times a day (BID) | ORAL | Status: DC
  Administered 2021-11-08 – 2021-11-11 (×7): 75 mg via ORAL
  Filled 2021-11-08 (×7): qty 3

## 2021-11-08 MED ORDER — GABAPENTIN 600 MG PO TABS
600.0000 mg | ORAL_TABLET | Freq: Three times a day (TID) | ORAL | Status: DC
  Administered 2021-11-08 – 2021-11-11 (×11): 600 mg via ORAL
  Filled 2021-11-08 (×11): qty 1

## 2021-11-08 MED ORDER — ASPIRIN 81 MG PO TBEC
81.0000 mg | DELAYED_RELEASE_TABLET | Freq: Every day | ORAL | Status: DC
  Administered 2021-11-08 – 2021-11-11 (×4): 81 mg via ORAL
  Filled 2021-11-08 (×4): qty 1

## 2021-11-08 MED ORDER — TIZANIDINE HCL 4 MG PO TABS: 4.0000 mg | ORAL_TABLET | Freq: Three times a day (TID) | ORAL | Status: DC | PRN

## 2021-11-08 MED ORDER — DULOXETINE HCL 30 MG PO CPEP
90.0000 mg | ORAL_CAPSULE | Freq: Every day | ORAL | Status: DC
  Administered 2021-11-08 – 2021-11-11 (×4): 90 mg via ORAL
  Filled 2021-11-08 (×4): qty 3

## 2021-11-08 MED ORDER — TAMSULOSIN HCL 0.4 MG PO CAPS
0.8000 mg | ORAL_CAPSULE | Freq: Every day | ORAL | Status: DC
  Administered 2021-11-08 – 2021-11-11 (×4): 0.8 mg via ORAL
  Filled 2021-11-08 (×4): qty 2

## 2021-11-08 MED ORDER — ORAL CARE MOUTH RINSE
15.0000 mL | OROMUCOSAL | Status: DC | PRN
Start: 2021-11-08 — End: 2021-11-11

## 2021-11-08 NOTE — Progress Notes (Signed)
OT Cancellation Note  Patient Details Name: Ollie Esty MRN: [id number] DOB: 12/03/1955   Cancelled Treatment:    Reason Eval/Treat Not Completed: Patient declined, no reason specified Pt currently asleep and on Bipap. Per RN, pt was requesting to sleep prior to PT session (completed recently). At this time will let pt sleep to reap benefits rest and use of Bipap for O2/CO2 exchange. Will return later as time allows and pt is appropriate.   Rebeca Alert 11/08/2021, 2:43 PM

## 2021-11-08 NOTE — Progress Notes (Signed)
Physical Therapy Treatment Patient Details Name: Jose [name] MRN: [id number] DOB: 1956-01-08 Today's Date: 11/08/2021   History of Present Illness Pt is a 66 y/o M presenting with AMS and respiratory distress. 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    PT Comments    Pt received in recliner, agreeable to therapy session with emphasis on safety with transfers, seated LE exercises, use of IS, benefits of more frequent mobility, orthostatic assessment (BP stable) and activity pacing. Pt self-limiting due to fatigue but with good command following and able to perform multiple standing/pre-gait exercises in front of chair this date. Pt defers gait due to fatigue. Pt continues to benefit from PT services to progress toward functional mobility goals.    Recommendations for follow up therapy are one component of a multi-disciplinary discharge planning process, led by the attending physician.  Recommendations may be updated based on patient status, additional functional criteria and insurance authorization.  Follow Up Recommendations  Skilled nursing-short term rehab (<3 hours/day)  (Per pt/spouse request)   Assistance Recommended at Discharge Intermittent Supervision/Assistance  Patient can return home with the following A little help with walking and/or transfers;A little help with bathing/dressing/bathroom;Assistance with cooking/housework;Assist for transportation;Help with stairs or ramp for entrance   Equipment Recommendations  Other (comment) (pt reports already having RW)    Recommendations for Other Services       Precautions / Restrictions Precautions Precautions: Fall Precaution Comments: SpO2 Restrictions Weight Bearing Restrictions: No     Mobility  Bed Mobility               General bed mobility  comments: pt sitting in recliner at start and end of session.    Transfers Overall transfer level: Needs assistance Equipment used: Rolling walker (2 wheels) Transfers: Sit to/from Stand Sit to Stand: Min guard           General transfer comment: stood from recliner with RW needed cues for safe hand placement initially with rising and lowering with fair carryover.    Ambulation/Gait Ambulation/Gait assistance: Min guard   Assistive device: Rolling walker (2 wheels)       Pre-gait activities: standing hip flexion x15 reps ea no dizziness but pt quick to fatigue. General Gait Details: pt refused ambulation-see pre-gait      Balance Overall balance assessment: Needs assistance Sitting-balance support: Feet supported Sitting balance-[name] Scale: Fair     Standing balance support: Bilateral upper extremity supported, Reliant on assistive device for balance (RW) Standing balance-[name] Scale: Fair Standing balance comment: pt able to maintain static standing balance with supervision at RW but min guard for safety due to pt hx of orthostatic symptoms/falls recently per spouse                            Cognition Arousal/Alertness: Awake/alert Behavior During Therapy: WFL for tasks assessed/performed Overall Cognitive Status: Impaired/Different from baseline                                 General Comments: per spouse he has had multiple falls (possibly related to orthostasis, unclear) and she had concerns about assisting him physically due to her hx of back surgery. Pt spouse given gait belt to take home for assisting him and was appreciative. pt  required encouragement to participate from PTA/spouse as he initially wanted to refuse.        Exercises Other Exercises Other Exercises: seated BLE AROM: heel/toe raises, LAQ, hip flexion x10-15 reps ea Other Exercises: standing BLE AROM: hip flexion, heel raises, mini squats x10-15 reps ea Other  Exercises: IS x 10 reps (300-500 mL)    General Comments General comments (skin integrity, edema, etc.): BP 116/57 after sitting in chair with legs down ~5 mins, then 125/58 standing, pt not dizzy; SpO2 WFL on 5L O2 [place] throughout, HR WFL      Pertinent Vitals/Pain Pain Assessment Pain Assessment: No/denies pain     PT Goals (current goals can now be found in the care plan section) Acute Rehab PT Goals Patient Stated Goal: to feel better, to go to rehab and get stronger prior to home PT Goal Formulation: With patient Time For Goal Achievement: 11/21/21 Progress towards PT goals: Progressing toward goals    Frequency    Min 3X/week      PT Plan Current plan remains appropriate       AM-PAC PT "6 Clicks" Mobility   Outcome Measure  Help needed turning from your back to your side while in a flat bed without using bedrails?: A Little Help needed moving from lying on your back to sitting on the side of a flat bed without using bedrails?: A Little Help needed moving to and from a bed to a chair (including a wheelchair)?: A Little Help needed standing up from a chair using your arms (e.g., wheelchair or bedside chair)?: A Little Help needed to walk in hospital room?: Total (pt not able to demonstrate this session or previous) Help needed climbing 3-5 steps with a railing? : Total (pt unable to demo back) 6 Click Score: 14    End of Session Equipment Utilized During Treatment: Oxygen;Gait belt Activity Tolerance: Patient limited by fatigue Patient left: in chair;with call bell/phone within reach;with nursing/sitter in room;with family/visitor present (spouse and RN present) Nurse Communication: Mobility status;Other (comment) (pt asking for bipap machine) PT Visit Diagnosis: Unsteadiness on feet (R26.81);Other abnormalities of gait and mobility (R26.89);Muscle weakness (generalized) (M62.81)     Time: 4801-6553 PT Time Calculation (min) (ACUTE ONLY): 26 min  Charges:   $Therapeutic Exercise: 8-22 mins $Therapeutic Activity: 8-22 mins                     Labria Wos P., PTA Acute Rehabilitation Services Secure Chat Preferred 9a-5:30pm Office: [phone]    Dorathy Kinsman [facility] 11/08/2021, 2:43 PM

## 2021-11-08 NOTE — Progress Notes (Signed)
PROGRESS NOTE    Jose [name]  WIO:[id number] DOB: 12/24/1955 DOA: 11/05/2021 PCP: [name], MD    Brief Narrative:    Jose [name] is a 66 y.o. 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  Patient reported increasing shortness of breath for last 2 weeks with cough and had stopped using his trilogy ventilator for at least 1 year due to loud noise.  He was compliant with bronchodilators.  He has relapsed into smoking as well.  In the ED, patient was mildly hypotensive.  ABG showed pH of 7.132, CO2 of 108 and was placed on BiPAP.  After 2 hours, he had improvement to pH of 7.235, 77 CO2. PCCM was consulted for this.  Patient was then admitted to hospital for further evaluation and treatment.  During hospitalization, patient's mental status improved with BiPAP and was then considered for disposition home but family had concerns about his falls weakness debility and requested PT evaluation.  PT has recommended skilled nursing facility placement at this time.  On 11/07/2021, patient was again confused and disoriented.  Noncompliance to BiPAP has been a big issue.   Assessment and Plan:  Principal Problem:   Acute on chronic respiratory failure with hypercapnia (HCC) Active Problems:   History of pulmonary embolus (PE)-2011   Type 2 diabetes mellitus with hyperlipidemia (HCC)   S/P CABG x 3   Prolonged QT interval   Obesity (BMI 30-39.9)   AKI (acute kidney injury) (HCC)   COPD with acute exacerbation (HCC)  Acute on chronic respiratory failure with hypercapnia and hypoxia secondary to COPD exacerbation with ongoing tobacco use.  Patient uses trilogy at home.   Episode of lethargy yesterday evening.  Patient was not on BiPAP at that time and was restarted on BiPAP with improvement.  Patient is on 4 to 6 liters of oxygen at baseline.  Follows up with pulmonary at [facility] but has not seen them in 1 year.  PCCM consulted during hospitalization.  Continue IV Solu-Medrol.  No obvious source of infection..  Continue doxycycline.  Procalcitonin was less than 0.1.  Patient was emphasized the need for being on BiPAP especially during the nighttime and as best as possible during the daytime while taking a nap..  I also communicated this with the nursing staff.  Spoke with the patient's wife on the phone as well.   AKI (acute kidney injury) (HCC) Improved.  Latest creatinine of 1.0   Prolonged QT interval QTc of 506 on presentation..  Try to avoid QT prolongation medication.   S/P CABG x 3 Asymptomatic stable.  No chest pain.   Type 2 diabetes mellitus with hyperlipidemia (HCC) Hemoglobin A1c on 8/24 at 6.5.  Continue sliding scale insulin.  Latest POC glucose of 79   History of pulmonary embolus (PE)-2011 with history of antiphospholipid antibody syndrome Continue warfarin.  INR of 2.3.  Therapeutic at this time  Ambulatory dysfunction/recurrent falls.  History of falls at home with episodes of lethargy and incontinence.  Patient's wife unable to take care of him at home and requesting rehabilitation.  Physical therapy has recommended skilled nursing facility placement at this time.   DVT prophylaxis:   Warfarin  Code Status:     Code  Status: Full Code  Disposition:   PT OT evaluation recommending skilled nursing facility.  Status is: Inpatient  Remains inpatient appropriate because: Respiratory failure, lethargic, debility weakness falls, need for rehabitation   Family Communication:  I spoke with the patient's spouse on the phone on 11/08/2021  Consultants:  PCCM  Procedures:  BiPAP placement  Antimicrobials:  Doxycycline  Subjective: Today,  patient was seen and examined at bedside.  Staff reported that he was very lethargic yesterday night and had to be put back on BiPAP.  This morning denies any pain, nausea, vomiting, fever, chills or rigor.  Objective: Vitals:   11/07/21 2020 11/07/21 2222 11/08/21 0256 11/08/21 0613  BP:  139/76  (!) 151/78  Pulse:  69  91  Resp: (!) 24 13  18   Temp:      TempSrc:      SpO2:  100% 92% 100%  Weight:      Height:        Intake/Output Summary (Last 24 hours) at 11/08/2021 0823 Last data filed at 11/08/2021 0452 Gross per 24 hour  Intake 600 ml  Output 2850 ml  Net -2250 ml    Filed Weights   11/05/21 2029  Weight: 108.9 kg    Physical Examination: Body mass index is 36.49 kg/m.   General: Obese built, not in obvious distress, on nasal cannula oxygen HENT:   No scleral pallor or icterus noted. Oral mucosa is moist.  Chest:    Diminished breath sounds bilaterally.  No appreciable wheezing noted. CVS: S1 &S2 heard. No murmur.  Regular rate and rhythm. Abdomen: Soft, nontender, nondistended.  Bowel sounds are heard.   Extremities: No cyanosis, clubbing or edema.  Peripheral pulses are palpable. Psych: Alert, awake and Communicative, normal mood CNS:  No cranial nerve deficits.  Moves all extremities, Skin: Warm and dry.  No rashes noted.  Data Reviewed:   CBC: Recent Labs  Lab 11/05/21 2043 11/05/21 2052 11/06/21 0001 11/06/21 0241 11/07/21 0157 11/07/21 2058 11/08/21 0226  WBC 12.3*  --  14.6*  --  13.4* 13.1* 11.0*  NEUTROABS 8.1*  --   --   --   --  9.6*  --   HGB 10.5*   < > 10.3* 11.2* 10.4* 10.2* 9.3*  HCT 36.9*   < > 35.8* 33.0* 35.5* 34.3* 32.3*  MCV 101.1*  --  101.1*  --  98.9 99.4 100.0  PLT 395  --  366  --  392 367 354   < > = values in this interval not displayed.     Basic Metabolic Panel: Recent Labs  Lab 11/05/21 2043 11/05/21 2052 11/05/21 2256 11/06/21 0001 11/06/21 0241 11/07/21 0157 11/08/21 0226  NA 143   < > 141 142 141 142 145   K 5.0   < > 5.0 5.4* 5.0 4.3 5.1  CL 104  --   --  106  --  103 104  CO2 33*  --   --  30  --  33* 37*  GLUCOSE 129*  --   --  143*  --  163* 88  BUN 18  --   --  17  --  16 24*  CREATININE 1.26*  --   --  1.16  --  1.05 1.07  CALCIUM 8.8*  --   --  8.5*  --  8.7* 8.9  MG  --   --   --   --   --  2.4 2.0   < > =  values in this interval not displayed.     Liver Function Tests: Recent Labs  Lab 11/05/21 2043  AST 17  ALT 22  ALKPHOS 70  BILITOT 0.3  PROT 7.8  ALBUMIN 3.3*      Radiology Studies: DG CHEST PORT 1 VIEW  Result Date: 11/07/2021 CLINICAL DATA:  Altered mental status. EXAM: PORTABLE CHEST 1 VIEW COMPARISON:  November 05, 2021 FINDINGS: Multiple sternal wires and vascular clips are seen. The cardiac silhouette is enlarged and unchanged in size. Both lungs are clear. The visualized skeletal structures are unremarkable. IMPRESSION: 1. Cardiomegaly and evidence of prior median sternotomy/CABG. 2. No acute or active cardiopulmonary disease. Electronically Signed   By: Aram Candela M.D.   On: 11/07/2021 21:11      LOS: 3 days    Joycelyn Das, MD [facility] Available via Epic secure chat 7am-7pm After these hours, please refer to coverage provider listed on [email or web address] 11/08/2021, 8:23 AM

## 2021-11-08 NOTE — Progress Notes (Signed)
Pt is currently not on Bipap. He is resting comfortably on 4lpm [place] Sp02 97%

## 2021-11-08 NOTE — Progress Notes (Signed)
Pt placed on BiPAP while napping.

## 2021-11-08 NOTE — Progress Notes (Addendum)
ANTICOAGULATION CONSULT NOTE  Pharmacy Consult for Warfarin Indication:  hx VTE and Antiphospholipid antibody syndrome  Allergies  Allergen Reactions   Melatonin Anxiety   Altace [Ramipril] Cough    Patient Measurements: Height: 5\' 8"  (172.7 cm) Weight: 108.9 kg (240 lb) IBW/kg (Calculated) : 68.4  Vital Signs: Temp: 98.6 F (37 C) (08/25 2006) Temp Source: Oral (08/25 2006) BP: 151/78 (08/26 01-17-2002) Pulse Rate: 91 (08/26 0613)  Labs: Recent Labs    11/05/21 2043 11/05/21 2052 11/06/21 0001 11/06/21 0241 11/07/21 0157 11/07/21 1154 11/07/21 2058 11/08/21 0226  HGB 10.5*   < > 10.3*   < > 10.4*  --  10.2* 9.3*  HCT 36.9*   < > 35.8*   < > 35.5*  --  34.3* 32.3*  PLT 395  --  366  --  392  --  367 354  LABPROT 24.7*  --   --   --   --  25.1*  --  24.8*  INR 2.3*  --   --   --   --  2.3*  --  2.3*  CREATININE 1.26*  --  1.16  --  1.05  --   --  1.07  TROPONINIHS 9  --  11  --   --   --   --   --    < > = values in this interval not displayed.    Estimated Creatinine Clearance: 81.3 mL/min (by C-G formula based on SCr of 1.07 mg/dL).   Assessment: 66 y.o. M with medical history significant for prior PE and antiphospholipid antibody syndrome on Warfarin PTA who presented with AMS and respiratory distress. Pharmacy consulted to manage warfarin. INR therapeutic 2.3. No issues with bleeding reported. CBC stable. Continue with PTA regimen.   PTA warfarin regimen: 5 mg all days except 6 mg Monday and Friday  (confirmed with patient, has 5 and 1 mg tablets at home)  Goal of Therapy:  INR 2-3 Monitor platelets by anticoagulation protocol: Yes   Plan:  Give warfarin 5 mg PO x1 dose Check INR daily while on warfarin Continue to monitor H&H and platelets    Thank you for allowing pharmacy to be a part of this patient's care.  Wednesday, PharmD Clinical Pharmacist

## 2021-11-09 DIAGNOSIS — N179 Acute kidney failure, unspecified: Secondary | ICD-10-CM | POA: Diagnosis not present

## 2021-11-09 DIAGNOSIS — Z86711 Personal history of pulmonary embolism: Secondary | ICD-10-CM | POA: Diagnosis not present

## 2021-11-09 DIAGNOSIS — J9622 Acute and chronic respiratory failure with hypercapnia: Secondary | ICD-10-CM | POA: Diagnosis not present

## 2021-11-09 DIAGNOSIS — J441 Chronic obstructive pulmonary disease with (acute) exacerbation: Secondary | ICD-10-CM | POA: Diagnosis not present

## 2021-11-09 LAB — GLUCOSE, CAPILLARY
Glucose-Capillary: 125 mg/dL — ABNORMAL HIGH (ref 70–99)
Glucose-Capillary: 96 mg/dL (ref 70–99)
Glucose-Capillary: 189 mg/dL — ABNORMAL HIGH (ref 70–99)
Glucose-Capillary: 183 mg/dL — ABNORMAL HIGH (ref 70–99)

## 2021-11-09 LAB — CBC
HCT: 29.2 % — ABNORMAL LOW (ref 39.0–52.0)
Hemoglobin: 9 g/dL — ABNORMAL LOW (ref 13.0–17.0)
MCH: 29.1 pg (ref 26.0–34.0)
MCHC: 30.8 g/dL (ref 30.0–36.0)
MCV: 94.5 fL (ref 80.0–100.0)
Platelets: 333 10*3/uL (ref 150–400)
RBC: 3.09 MIL/uL — ABNORMAL LOW (ref 4.22–5.81)
RDW: 14.2 % (ref 11.5–15.5)
WBC: 11 10*3/uL — ABNORMAL HIGH (ref 4.0–10.5)
nRBC: 0 % (ref 0.0–0.2)

## 2021-11-09 LAB — BASIC METABOLIC PANEL
Anion gap: 7 (ref 5–15)
BUN: 30 mg/dL — ABNORMAL HIGH (ref 8–23)
CO2: 35 mmol/L — ABNORMAL HIGH (ref 22–32)
Calcium: 8.6 mg/dL — ABNORMAL LOW (ref 8.9–10.3)
Chloride: 98 mmol/L (ref 98–111)
Creatinine, Ser: 1.22 mg/dL (ref 0.61–1.24)
GFR, Estimated: >60 mL/min (ref >60)
Glucose, Bld: 94 mg/dL (ref 70–99)
Potassium: 4.3 mmol/L (ref 3.5–5.1)
Sodium: 140 mmol/L (ref 135–145)

## 2021-11-09 LAB — PROTIME-INR
INR: 2.3 — ABNORMAL HIGH (ref 0.8–1.2)
Prothrombin Time: 24.9 seconds — ABNORMAL HIGH (ref 11.4–15.2)

## 2021-11-09 MED ORDER — DOXYCYCLINE HYCLATE 100 MG PO TABS
100.0000 mg | ORAL_TABLET | Freq: Two times a day (BID) | ORAL | Status: DC
  Administered 2021-11-09 – 2021-11-11 (×4): 100 mg via ORAL
  Filled 2021-11-09 (×4): qty 1

## 2021-11-09 MED ORDER — WARFARIN SODIUM 5 MG PO TABS
5.0000 mg | ORAL_TABLET | Freq: Once | ORAL | Status: AC
  Administered 2021-11-09: 5 mg via ORAL
  Filled 2021-11-09: qty 1

## 2021-11-09 NOTE — Progress Notes (Signed)
This patient is receiving the antibiotic doxycycline by the intravenous route.   Based on criteria approved by the Pharmacy and Therapeutics Committee, and the  Infectious Disease Division, the antibiotic(s) is / are being converted to equivalent oral dose form(s). These criteria include:  Patient being treated for a respiratory tract infection, urinary tract infection, cellulitis, or Clostridium Difficile associated diarrhea  The patient is not neutropenic and does not exhibit a GI malabsorption state  The patient is eating (either orally or per tube) and/or has been taking other orally administered medications for at least 24 hours.  The patient is improving clinically (physician assessment and a 24-hour Tmax of 100.5 F).   If you have questions about this conversion, please contact the pharmacy department.   Thank you for allowing pharmacy to be a part of this patient's care.  Nashira Mcglynn, PharmD Clinical Pharmacist 

## 2021-11-09 NOTE — Progress Notes (Signed)
ANTICOAGULATION CONSULT NOTE  Pharmacy Consult for Warfarin Indication:  hx VTE and Antiphospholipid antibody syndrome  Allergies  Allergen Reactions   Melatonin Anxiety   Altace [Ramipril] Cough    Patient Measurements: Height: 5\' 8"  (172.7 cm) Weight: 108.9 kg (240 lb) IBW/kg (Calculated) : 68.4  Vital Signs: Temp: 98.5 F (36.9 C) (08/27 0845) Temp Source: Oral (08/27 0845) BP: 118/58 (08/27 0845) Pulse Rate: 82 (08/27 0845)  Labs: Recent Labs    11/07/21 0157 11/07/21 1154 11/07/21 2058 11/08/21 0226 11/09/21 0112  HGB 10.4*  --  10.2* 9.3* 9.0*  HCT 35.5*  --  34.3* 32.3* 29.2*  PLT 392  --  367 354 333  LABPROT  --  25.1*  --  24.8* 24.9*  INR  --  2.3*  --  2.3* 2.3*  CREATININE 1.05  --   --  1.07 1.22    Estimated Creatinine Clearance: 71.3 mL/min (by C-G formula based on SCr of 1.22 mg/dL).   Assessment: 66 y.o. M with medical history significant for prior PE and antiphospholipid antibody syndrome on Warfarin PTA who presented with AMS and respiratory distress. Pharmacy consulted to manage warfarin. INR therapeutic 2.3. No issues with bleeding reported. CBC stable. Continue with PTA regimen.   PTA warfarin regimen: 5 mg all days except 6 mg Monday and Friday  (confirmed with patient, has 5 and 1 mg tablets at home)  Goal of Therapy:  INR 2-3 Monitor platelets by anticoagulation protocol: Yes   Plan:  Give warfarin 5 mg PO x1 dose Check INR daily while on warfarin Continue to monitor H&H and platelets    Thank you for allowing pharmacy to be a part of this patient's care.  Wednesday, PharmD Clinical Pharmacist

## 2021-11-09 NOTE — Evaluation (Signed)
Occupational Therapy Evaluation Patient Details Name: Jose [name] MRN: [id number] DOB: 28-Jun-1955 Today's Date: 11/09/2021   History of Present Illness Pt is a 66 y/o M presenting with AMS and respiratory distress. 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   Clinical Impression   PTA, pt reports he was independent with functional mobility with intermittent use of rollator when out of the home. Pt reports frequent falls at home, urinary incontinence, poor adherence to trilogy and decreased activity tolerance at baseline. Pt currently with improve cognition, he is A&Ox4 but continues to demonstrate decreased safety awareness. He currently requires minguard assistance for functional mobility at RW level. Pt on 4lnc SpO2 98% at rest, 88% with exertion, required 5lnc to rebound O2>92% with return to sitting. Began education on energy conservation strategies, management of incontinence and fall risk prevention. Currently recommend d/c to further rehab. Will continue to follow acutely.       Recommendations for follow up therapy are one component of a multi-disciplinary discharge planning process, led by the attending physician.  Recommendations may be updated based on patient status, additional functional criteria and insurance authorization.   Follow Up Recommendations  Skilled nursing-short term rehab (<3 hours/day)    Assistance Recommended at Discharge Intermittent Supervision/Assistance  Patient can return home with the following A little help with walking and/or transfers;A little help with bathing/dressing/bathroom    Functional Status Assessment  Patient has had a recent decline in their functional status and demonstrates the ability to make significant improvements in function in a reasonable and predictable amount of time.   Equipment Recommendations  Tub/shower bench    Recommendations for Other Services       Precautions / Restrictions Precautions Precautions: Fall Precaution Comments: SpO2 Restrictions Weight Bearing Restrictions: No      Mobility Bed Mobility Overal bed mobility: Modified Independent             General bed mobility comments: heavy use of bed rails, increased effort    Transfers Overall transfer level: Needs assistance Equipment used: Rolling walker (2 wheels) Transfers: Sit to/from Stand Sit to Stand: Min guard           General transfer comment: minguard for safety and cues for safe hand placement, pt ambulated in room      Balance Overall balance assessment: Needs assistance Sitting-balance support: Feet supported Sitting balance-[name] Scale: Fair     Standing balance support: Bilateral upper extremity supported, Reliant on assistive device for balance (RW) Standing balance-[name] Scale: Fair Standing balance comment: pt able to maintain static standing balance with supervision at RW but min guard for safety due to pt hx of orthostatic symptoms/falls recently per spouse                           ADL either performed or assessed with clinical judgement   ADL Overall ADL's : Needs assistance/impaired Eating/Feeding: Set up;Sitting   Grooming: Min guard;Standing   Upper Body Bathing: Set up;Sitting   Lower Body Bathing: Moderate assistance Lower Body Bathing Details (indicate cue type and reason): to access BLE Upper Body Dressing : Set up;Sitting   Lower Body Dressing: Moderate assistance Lower Body Dressing Details (indicate cue type and reason): to access bilateral feet, pt would benefit from use of AE will adress at followup sessions Toilet Transfer: Min guard;Ambulation Toilet  Transfer Details (indicate cue type and reason): cues for safe hand placement Toileting- Clothing Manipulation and Hygiene: Min guard;Sit to/from stand        Functional mobility during ADLs: Min guard;Rolling walker (2 wheels) General ADL Comments: minguard for safety, pt on 4lnc SpO2 98% at rest, desat to 88% with exertion, required 5lnc to return to >92%     Vision         Perception     Praxis      Pertinent Vitals/Pain Pain Assessment Pain Assessment: No/denies pain     Hand Dominance Left   Extremity/Trunk Assessment Upper Extremity Assessment Upper Extremity Assessment: Overall WFL for tasks assessed   Lower Extremity Assessment Lower Extremity Assessment: Defer to PT evaluation   Cervical / Trunk Assessment Cervical / Trunk Assessment: Normal   Communication Communication Communication: No difficulties   Cognition Arousal/Alertness: Awake/alert Behavior During Therapy: WFL for tasks assessed/performed Overall Cognitive Status: Within Functional Limits for tasks assessed                                 General Comments: pt with improved clarity and cognition this date. Pt A&O x4 demonstrates good safety awareness. However, continues to demonstrate decreased understanding of importance of use of trilogy and adherence to medication management.     General Comments       Exercises     Shoulder Instructions      Home Living Family/patient expects to be discharged to:: Private residence Living Arrangements: Spouse/significant other Available Help at Discharge: Available PRN/intermittently;Family (wife works) Type of Home: Mobile home Home Access: Stairs to enter Secretary/administrator of Steps: 4 + 6 Entrance Stairs-Rails: Right Home Layout: One level     Bathroom Shower/Tub: Chief Strategy Officer: Standard Bathroom Accessibility: Yes (might have to turn RW sideways)   Home Equipment: Agricultural consultant (2 wheels);Rollator (4 wheels);Cane - single point          Prior Functioning/Environment Prior Level of Function : Needs assist       Physical Assist : Mobility  (physical);ADLs (physical) Mobility (physical): Gait   Mobility Comments: pt reports he was furniture walking and wife needed to assist intermittently ADLs Comments: Pt is able to cook/clean on his own. reports incontinence and poor adherence to use of trilogy        OT Problem List: Decreased activity tolerance;Impaired balance (sitting and/or standing);Decreased safety awareness;Cardiopulmonary status limiting activity;Obesity      OT Treatment/Interventions: Self-care/ADL training;Therapeutic exercise;Energy conservation;DME and/or AE instruction;Patient/family education;Balance training    OT Goals(Current goals can be found in the care plan section) Acute Rehab OT Goals Patient Stated Goal: to go to rehab, get stronger and return home OT Goal Formulation: With patient Time For Goal Achievement: 11/23/21 Potential to Achieve Goals: Good ADL Goals Pt Will Perform Lower Body Dressing: with modified independence;sit to/from stand;with adaptive equipment Pt Will Transfer to Toilet: with modified independence;ambulating Additional ADL Goal #1: Pt will demonstrate indepenence with at least 3 energy conservation strategies during ADL completion. Additional ADL Goal #2: Pt will demonstrate independence wtih 3 fall prevention strategies.  OT Frequency: Min 2X/week    Co-evaluation              AM-PAC OT "6 Clicks" Daily Activity     Outcome Measure Help from another person eating meals?: None Help from another person taking care of personal grooming?: A Little Help from another person toileting,  which includes using toliet, bedpan, or urinal?: A Little Help from another person bathing (including washing, rinsing, drying)?: A Little Help from another person to put on and taking off regular upper body clothing?: None Help from another person to put on and taking off regular lower body clothing?: A Little 6 Click Score: 20   End of Session Equipment Utilized During Treatment:  Rolling walker (2 wheels);Oxygen Nurse Communication: Mobility status  Activity Tolerance: Patient tolerated treatment well Patient left: in chair;with call bell/phone within reach;with chair alarm set  OT Visit Diagnosis: Other abnormalities of gait and mobility (R26.89);History of falling (Z91.81);Muscle weakness (generalized) (M62.81)                Time: 3710-6269 OT Time Calculation (min): 44 min Charges:  OT General Charges $OT Visit: 1 Visit OT Evaluation $OT Eval Moderate Complexity: 1 Mod OT Treatments $Self Care/Home Management : 23-37 mins  Rosey Bath OTR/L Acute Rehabilitation Services Office: [phone]   Rebeca Alert 11/09/2021, 10:56 AM

## 2021-11-09 NOTE — Progress Notes (Signed)
Pt placed on Bipap to sleep.

## 2021-11-09 NOTE — Progress Notes (Signed)
PROGRESS NOTE    Jose [name]  [id number] DOB: [birth year] DOA: 11/05/2021 PCP: Elenore Paddy, MD    Brief Narrative:    Jose [name] is a 66 y.o. 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  Patient reported increasing shortness of breath for last 2 weeks with cough and had stopped using his trilogy ventilator for at least 1 year due to loud noise.  He was compliant with bronchodilators.  He has relapsed into smoking as well.  In the ED, patient was mildly hypotensive.  ABG showed pH of 7.132, CO2 of 108 and was placed on BiPAP.  After 2 hours, he had improvement to pH of 7.235, 77 CO2. PCCM was consulted for this.  Patient was then admitted to hospital for further evaluation and treatment.  During hospitalization, patient's mental status improved with BiPAP and was then considered for disposition home but family had concerns about his falls weakness debility and requested PT evaluation.  PT has recommended skilled nursing facility placement at this time.  On 11/07/2021, patient was again confused and disoriented.  Noncompliance to BiPAP has been a big issue and has been thoroughly counseled to continue to use BiPAP during hospitalization.   Assessment and Plan:  Principal Problem:   Acute on chronic respiratory failure with hypercapnia (HCC) Active Problems:   History of pulmonary embolus (PE)-2011   Type 2 diabetes mellitus with hyperlipidemia (HCC)   S/P CABG x 3   Prolonged QT interval   Obesity (BMI 30-39.9)   AKI (acute kidney injury) ([place])   COPD with acute exacerbation (HCC)  Acute on chronic respiratory failure with hypercapnia and hypoxia  secondary to COPD exacerbation with ongoing tobacco use.  Patient uses trilogy at home.  Patient is on 4 to 6 liters of oxygen at baseline.  Follows up with pulmonary at [facility] but has not seen them in 1 year.  PCCM consulted during hospitalization.  Continue IV Solu-Medrol.  No obvious source of infection..  Continue doxycycline.  Procalcitonin was less than 0.1.  Patient was emphasized the need for being on BiPAP especially during the nighttime and as best as possible during the daytime while taking a nap.  Patient understands the need for BiPAP.   AKI (acute kidney injury) ([place]) Improved.  Latest creatinine of 1.2    S/P CABG x 3 Asymptomatic stable.  No chest pain.   Type 2 diabetes mellitus with hyperlipidemia (HCC) Hemoglobin A1c on 8/24 at 6.5.  Continue sliding scale insulin.    History of pulmonary embolus (PE)-2011 with history of antiphospholipid antibody syndrome Continue warfarin.  Latest INR of 2.3.  Therapeutic at this time  Ambulatory dysfunction/recurrent falls.   History of falls at home with episodes of lethargy and incontinence.  Patient's wife unable to take care of him at home and requesting rehabilitation.  Physical therapy has recommended skilled nursing facility placement at this time.   DVT prophylaxis:   Warfarin  Code Status:     Code Status: Full Code  Disposition:   PT OT evaluation recommending skilled nursing facility.  Status is: Inpatient  Remains inpatient appropriate because: Chronic respiratory failure,debility weakness falls, need for rehabitation   Family Communication:  I spoke with the patient's spouse  on the phone on 11/08/2021  Consultants:  PCCM  Procedures:  BiPAP placement  Antimicrobials:  Doxycycline  Subjective: Today, patient was seen and examined at bedside.  Patient denies any nausea vomiting fever chills or rigors.  Feels okay with breathing.  No chest pain.  Objective: Vitals:   11/09/21 0339 11/09/21 0528 11/09/21 0814  11/09/21 0845  BP: 123/64 124/68  (!) 118/58  Pulse: 62 60 60 82  Resp: 20  16 (!) 25  Temp:    98.5 F (36.9 C)  TempSrc:    Oral  SpO2: 99% 99% 98% 96%  Weight:      Height:        Intake/Output Summary (Last 24 hours) at 11/09/2021 1149 Last data filed at 11/09/2021 1001 Gross per 24 hour  Intake 1610 ml  Output 2150 ml  Net -540 ml    Filed Weights   11/05/21 2029  Weight: 108.9 kg    Physical Examination: Body mass index is 36.49 kg/m.   General: Obese built, not in obvious distress, on nasal cannula oxygen HENT:   No scleral pallor or icterus noted. Oral mucosa is moist.  Chest:   Diminished breath sounds bilaterally. No crackles or wheezes.  CVS: S1 &S2 heard. No murmur.  Regular rate and rhythm. Abdomen: Soft, nontender, nondistended.  Bowel sounds are heard.   Extremities: No cyanosis, clubbing or edema.  Peripheral pulses are palpable. Psych: Alert, awake and oriented, normal mood CNS:  No cranial nerve deficits.  Moving all extremities. Skin: Warm and dry.  No rashes noted.  Data Reviewed:   CBC: Recent Labs  Lab 11/05/21 2043 11/05/21 2052 11/06/21 0001 11/06/21 0241 11/07/21 0157 11/07/21 2058 11/08/21 0226 11/09/21 0112  WBC 12.3*  --  14.6*  --  13.4* 13.1* 11.0* 11.0*  NEUTROABS 8.1*  --   --   --   --  9.6*  --   --   HGB 10.5*   < > 10.3* 11.2* 10.4* 10.2* 9.3* 9.0*  HCT 36.9*   < > 35.8* 33.0* 35.5* 34.3* 32.3* 29.2*  MCV 101.1*  --  101.1*  --  98.9 99.4 100.0 94.5  PLT 395  --  366  --  392 367 354 333   < > = values in this interval not displayed.     Basic Metabolic Panel: Recent Labs  Lab 11/05/21 2043 11/05/21 2052 11/06/21 0001 11/06/21 0241 11/07/21 0157 11/08/21 0226 11/09/21 0112  NA 143   < > 142 141 142 145 140  K 5.0   < > 5.4* 5.0 4.3 5.1 4.3  CL 104  --  106  --  103 104 98  CO2 33*  --  30  --  33* 37* 35*  GLUCOSE 129*  --  143*  --  163* 88 94  BUN 18  --  17  --  16 24* 30*  CREATININE 1.26*  --  1.16   --  1.05 1.07 1.22  CALCIUM 8.8*  --  8.5*  --  8.7* 8.9 8.6*  MG  --   --   --   --  2.4 2.0  --    < > = values in this interval not displayed.     Liver Function Tests: Recent Labs  Lab 11/05/21 2043  AST 17  ALT 22  ALKPHOS 70  BILITOT 0.3  PROT 7.8  ALBUMIN 3.3*      Radiology Studies: DG CHEST PORT 1 VIEW  Result Date: 11/07/2021  CLINICAL DATA:  Altered mental status. EXAM: PORTABLE CHEST 1 VIEW COMPARISON:  November 05, 2021 FINDINGS: Multiple sternal wires and vascular clips are seen. The cardiac silhouette is enlarged and unchanged in size. Both lungs are clear. The visualized skeletal structures are unremarkable. IMPRESSION: 1. Cardiomegaly and evidence of prior median sternotomy/CABG. 2. No acute or active cardiopulmonary disease. Electronically Signed   By: Aram Candela M.D.   On: 11/07/2021 21:11      LOS: 4 days    Joycelyn Das, MD [facility] Available via Epic secure chat 7am-7pm After these hours, please refer to coverage provider listed on [email or web address] 11/09/2021, 11:49 AM

## 2021-11-10 ENCOUNTER — Inpatient Hospital Stay (HOSPITAL_COMMUNITY): Payer: Medicare Other

## 2021-11-10 DIAGNOSIS — N179 Acute kidney failure, unspecified: Secondary | ICD-10-CM | POA: Diagnosis not present

## 2021-11-10 DIAGNOSIS — J9622 Acute and chronic respiratory failure with hypercapnia: Secondary | ICD-10-CM | POA: Diagnosis not present

## 2021-11-10 DIAGNOSIS — Z86711 Personal history of pulmonary embolism: Secondary | ICD-10-CM | POA: Diagnosis not present

## 2021-11-10 DIAGNOSIS — J441 Chronic obstructive pulmonary disease with (acute) exacerbation: Secondary | ICD-10-CM | POA: Diagnosis not present

## 2021-11-10 LAB — PROTIME-INR
INR: 2.1 — ABNORMAL HIGH (ref 0.8–1.2)
Prothrombin Time: 23.2 seconds — ABNORMAL HIGH (ref 11.4–15.2)

## 2021-11-10 LAB — GLUCOSE, CAPILLARY
Glucose-Capillary: 114 mg/dL — ABNORMAL HIGH (ref 70–99)
Glucose-Capillary: 118 mg/dL — ABNORMAL HIGH (ref 70–99)
Glucose-Capillary: 200 mg/dL — ABNORMAL HIGH (ref 70–99)
Glucose-Capillary: 188 mg/dL — ABNORMAL HIGH (ref 70–99)

## 2021-11-10 MED ORDER — WARFARIN SODIUM 6 MG PO TABS
6.0000 mg | ORAL_TABLET | Freq: Once | ORAL | Status: AC
  Administered 2021-11-10: 6 mg via ORAL
  Filled 2021-11-10: qty 1

## 2021-11-10 MED ORDER — PREDNISOLONE 5 MG PO TABS
30.0000 mg | ORAL_TABLET | Freq: Every day | ORAL | Status: DC
Start: 2021-11-10 — End: 2021-11-10

## 2021-11-10 MED ORDER — PREDNISONE 20 MG PO TABS
30.0000 mg | ORAL_TABLET | Freq: Every day | ORAL | Status: DC
Start: 2021-11-11 — End: 2021-11-11
  Administered 2021-11-11: 30 mg via ORAL
  Filled 2021-11-10: qty 1

## 2021-11-10 NOTE — Plan of Care (Signed)
°  Problem: Education: °Goal: Knowledge of disease or condition will improve °Outcome: Progressing °Goal: Knowledge of the prescribed therapeutic regimen will improve °Outcome: Progressing °Goal: Individualized Educational Video(s) °Outcome: Progressing °  °

## 2021-11-10 NOTE — Progress Notes (Signed)
PROGRESS NOTE    Jose [name]  AYO:[id number] DOB: 1955/11/09 DOA: 11/05/2021 PCP: [name], MD    Brief Narrative:    Jose [name] is a 66 y.o. 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  Patient reported increasing shortness of breath for last 2 weeks with cough and had stopped using his trilogy ventilator for at least 1 year due to loud noise.  He was compliant with bronchodilators.  He has relapsed into smoking as well.  In the ED, patient was mildly hypotensive.  ABG showed pH of 7.132, CO2 of 108 and was placed on BiPAP.  After 2 hours, he had improvement to pH of 7.235, 77 CO2. PCCM was consulted for this.  Patient was then admitted to hospital for further evaluation and treatment.  During hospitalization, patient's mental status improved with BiPAP and was then considered for disposition home but family had concerns about his falls, weakness debility and requested PT evaluation.  PT has recommended skilled nursing facility placement at this time.  On 11/07/2021, patient was again confused and disoriented.  Noncompliance to BiPAP has been a big issue and has been thoroughly counseled to continue to use BiPAP during hospitalization.   Assessment and Plan:  Principal Problem:   Acute on chronic respiratory failure with hypercapnia (HCC) Active Problems:   History of pulmonary embolus (PE)-2011   Type 2 diabetes mellitus with hyperlipidemia (HCC)   S/P CABG x 3   Prolonged QT interval   Obesity (BMI 30-39.9)   AKI (acute kidney injury) (HCC)   COPD with acute exacerbation (HCC)  Acute on chronic respiratory failure with hypercapnia and hypoxia  secondary to COPD exacerbation with ongoing tobacco use.   Patient uses trilogy at home.  Patient is on 4 to 6 liters of oxygen at baseline.  Follows up with pulmonary at [facility] but has not seen them in 1 year.  PCCM was  consulted during hospitalization.  Was on IV Solu-Medrol.  Will change to oral prednisone.  No obvious source of infection..  Continue doxycycline to complete 5-day course..  Procalcitonin was less than 0.1.  Patient was emphasized the need for being on BiPAP especially during the nighttime and as best as possible during the daytime while taking a nap.  Patient understands the need for BiPAP.  Currently planning for going to skilled nursing facility with BiPAP.   AKI (acute kidney injury) (HCC) Improved.  Latest creatinine of 1.2    S/P CABG x 3 Asymptomatic stable.  No chest pain.   Type 2 diabetes mellitus with hyperlipidemia (HCC) Hemoglobin A1c on 8/24 at 6.5.  Continue sliding scale insulin.    History of pulmonary embolus (PE)-2011 with history of antiphospholipid antibody syndrome Continue warfarin.  Latest INR of 2.3.  Therapeutic at this time  Ambulatory dysfunction/recurrent falls.   History of falls at home with episodes of lethargy and incontinence.  Patient's wife unable to take care of him at home and requesting rehabilitation.  Physical therapy has recommended skilled nursing facility placement at this time.   DVT prophylaxis:   Warfarin  Code Status:     Code Status: Full Code  Disposition:   PT OT evaluation recommending skilled nursing facility.  Communicated with  TOC for need of BiPAP on discharge.  Status is: Inpatient  Remains inpatient appropriate because: Chronic respiratory failure,debility weakness falls, need for rehabitation   Family Communication:  I spoke with the patient's spouse on the phone on 11/08/2021  Consultants:  PCCM  Procedures:  BiPAP placement  Antimicrobials:  Doxycycline  Subjective: Today, patient was seen and  examined at bedside.  Denies any nausea vomiting fever chills or rigor.  States that he used to be 5 and half hours of BiPAP in the nighttime.  Encouraged him to use throughout the night.    Objective: Vitals:   11/10/21 0548 11/10/21 0743 11/10/21 0836 11/10/21 0854  BP: (!) 140/77 118/63    Pulse:  66  67  Resp: 17 19    Temp: 98.2 F (36.8 C) 98 F (36.7 C)    TempSrc: Oral Oral    SpO2:  97% 99%   Weight:      Height:       No intake or output data in the 24 hours ending 11/10/21 1022  Filed Weights   11/05/21 2029  Weight: 108.9 kg    Physical Examination: Body mass index is 36.49 kg/m.   General: Obese built,, not in obvious distress, on nasal cannula oxygen HENT:   No scleral pallor or icterus noted. Oral mucosa is moist.  Chest:    Diminished breath sounds bilaterally.  CVS: S1 &S2 heard. No murmur.  Regular rate and rhythm. Abdomen: Soft, nontender, nondistended.  Bowel sounds are heard.   Extremities: No cyanosis, clubbing or edema.  Peripheral pulses are palpable. Psych: Alert, awake and oriented, normal mood CNS:  No cranial nerve deficits.  Moves all extremities. Skin: Warm and dry.  No rashes noted.  Data Reviewed:   CBC: Recent Labs  Lab 11/05/21 2043 11/05/21 2052 11/06/21 0001 11/06/21 0241 11/07/21 0157 11/07/21 2058 11/08/21 0226 11/09/21 0112  WBC 12.3*  --  14.6*  --  13.4* 13.1* 11.0* 11.0*  NEUTROABS 8.1*  --   --   --   --  9.6*  --   --   HGB 10.5*   < > 10.3* 11.2* 10.4* 10.2* 9.3* 9.0*  HCT 36.9*   < > 35.8* 33.0* 35.5* 34.3* 32.3* 29.2*  MCV 101.1*  --  101.1*  --  98.9 99.4 100.0 94.5  PLT 395  --  366  --  392 367 354 333   < > = values in this interval not displayed.     Basic Metabolic Panel: Recent Labs  Lab 11/05/21 2043 11/05/21 2052 11/06/21 0001 11/06/21 0241 11/07/21 0157 11/08/21 0226 11/09/21 0112  NA 143   < > 142 141 142 145 140  K 5.0   < > 5.4* 5.0 4.3 5.1 4.3  CL 104  --  106  --  103 104 98  CO2 33*   --  30  --  33* 37* 35*  GLUCOSE 129*  --  143*  --  163* 88 94  BUN 18  --  17  --  16 24* 30*  CREATININE 1.26*  --  1.16  --  1.05 1.07 1.22  CALCIUM 8.8*  --  8.5*  --  8.7* 8.9 8.6*  MG  --   --   --   --  2.4 2.0  --    < > = values in this interval not displayed.     Liver Function Tests: Recent Labs  Lab 11/05/21 2043  AST 17  ALT  22  ALKPHOS 70  BILITOT 0.3  PROT 7.8  ALBUMIN 3.3*     Radiology Studies: No results found.    LOS: 5 days    Flora Lipps, MD [facility] Available via Epic secure chat 7am-7pm After these hours, please refer to coverage provider listed on [email or web address] 11/10/2021, 10:22 AM

## 2021-11-10 NOTE — TOC Progression Note (Addendum)
Transition of Care [facility]) - Progression Note    Patient Details  Name: Jose [name] MRN: [id number] Date of Birth: 08/09/1955  [facility] CM/SW Contact  Lorri Frederick, LCSW Phone Number: 11/10/2021, 10:30 AM  Clinical Narrative:  SNF Bed offers provided to pt.  He is still talking about going home rather than SNF.  CSW asked him to discuss with wife.     11: 00  TC with wife Jose [name].  She is requesting other SNF options.  Does not want [facility] due to active covid there.  CSW [employer], [employer], [address].  1200: [facility] and [facility] cannot offer bed.  No response [place].  1430: Attempt to call wife, no answer, left message.  Auth request submitted in [place] with facility choice pending.   1530: CSW spoke with wife, updated her, she would like to accept offer at [facility] as long as there is no active covid there.  CSW confirmed with [facility]--they do not have covid cases there currently.     Expected Discharge Plan: Home w Home Health Services (vs SNF) Barriers to Discharge: No Barriers Identified  Expected Discharge Plan and Services Expected Discharge Plan: Home w Home Health Services (vs SNF)   Discharge Planning Services: CM Consult Post Acute Care Choice: Durable Medical Equipment (bipap,oxygen, nebulizer, cane ,walker) Living arrangements for the past 2 months: Mobile Home Expected Discharge Date: 11/07/21                         HH Arranged: RN, Nurse's Aide HH Agency: [facility] Date HH Agency Contacted: 11/07/21 Time HH Agency Contacted: 1022 Representative spoke with at [facility] Agency: Morrie Sheldon   Social Determinants of Health (SDOH) Interventions    Readmission Risk Interventions    07/16/2020   12:39 PM  Readmission Risk Prevention Plan  Transportation Screening Complete  PCP or Specialist Appt within 3-5 Days Complete  HRI or Home Care Consult Complete  Social Work Consult for Recovery Care  Planning/Counseling Complete  Palliative Care Screening Not Applicable  Medication Review Oceanographer) Complete

## 2021-11-10 NOTE — Progress Notes (Signed)
Occupational Therapy Treatment Patient Details Name: Jose [name] MRN: [id number] DOB: [birth year] Today's Date: 11/10/2021   History of present illness Pt is Jose 66 y/o M presenting with AMS and respiratory distress. 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   OT comments  Jose [name] is making notable progress. He was sitting EOB upon arrival. Overall he required supervision Jose for mobility in the room, transfers and grooming at the sink. He was min Jose for LB Adls due to pain with twisting. He demonstrated poor activity tolerance and general weakness, SpO2 >92% on 4L [place]. OT to continue to follow. POC remains appropriate.    Recommendations for follow up therapy are one component of Jose multi-disciplinary discharge planning process, led by the attending physician.  Recommendations may be updated based on patient status, additional functional criteria and insurance authorization.    Follow Up Recommendations  Skilled nursing-short term rehab (<3 hours/day)    Assistance Recommended at Discharge Intermittent Supervision/Assistance  Patient can return home with the following  Jose little help with walking and/or transfers;Jose little help with bathing/dressing/bathroom   Equipment Recommendations  Tub/shower bench       Precautions / Restrictions Precautions Precautions: Fall Precaution Comments: SpO2 Restrictions Weight Bearing Restrictions: No       Mobility Bed Mobility Overal bed mobility: Needs Assistance Bed Mobility: Supine to Sit     Supine to sit: Supervision          Transfers Overall transfer level: Needs assistance Equipment used: Rolling walker (2 wheels) Transfers: Sit to/from Stand Sit to Stand: Supervision                 Balance Overall balance assessment: Needs assistance Sitting-balance support:  Feet supported Sitting balance-[name] Scale: Good     Standing balance support: Single extremity supported, During functional activity Standing balance-[name] Scale: Fair Standing balance comment: 1 UE supported on sink wtih grooming           ADL either performed or assessed with clinical judgement   ADL Overall ADL's : Needs assistance/impaired     Grooming: Modified independent;Standing       Lower Body Bathing: Minimal assistance;Sit to/from stand Lower Body Bathing Details (indicate cue type and reason): assist for rear peri area. Pt reports pain with twisting/reaching         Toilet Transfer: Supervision/safety;Rolling walker (2 wheels);BSC/3in1           Functional mobility during ADLs: Supervision/safety;Rolling walker (2 wheels) General ADL Comments: poor activity tolerane, 4L [place] throughout    Extremity/Trunk Assessment Upper Extremity Assessment Upper Extremity Assessment: Overall WFL for tasks assessed   Lower Extremity Assessment Lower Extremity Assessment: Defer to PT evaluation        Vision   Vision Assessment?: No apparent visual deficits   Perception Perception Perception: Not tested   Praxis Praxis Praxis: Not tested    Cognition Arousal/Alertness: Awake/alert Behavior During Therapy: WFL for tasks assessed/performed Overall Cognitive Status: Within Functional Limits for tasks assessed                            General Comments VSS on 4L    Pertinent Vitals/ Pain       Pain Assessment Pain Assessment: No/denies pain   Frequency  Min 2X/week        Progress Toward  Goals  OT Goals(current goals can now be found in the care plan section)  Progress towards OT goals: Progressing toward goals  Acute Rehab OT Goals Patient Stated Goal: get stronger OT Goal Formulation: With patient Time For Goal Achievement: 11/23/21 Potential to Achieve Goals: Good ADL Goals Pt Will Perform Lower Body Dressing: with modified  independence;sit to/from stand;with adaptive equipment Pt Will Transfer to Toilet: with modified independence;ambulating Additional ADL Goal #1: Pt will demonstrate indepenence with at least 3 energy conservation strategies during ADL completion. Additional ADL Goal #2: Pt will demonstrate independence wtih 3 fall prevention strategies.  Plan Discharge plan remains appropriate       AM-PAC OT "6 Clicks" Daily Activity     Outcome Measure   Help from another person eating meals?: None Help from another person taking care of personal grooming?: Jose Little Help from another person toileting, which includes using toliet, bedpan, or urinal?: Jose Little Help from another person bathing (including washing, rinsing, drying)?: Jose Little Help from another person to put on and taking off regular upper body clothing?: None Help from another person to put on and taking off regular lower body clothing?: Jose Little 6 Click Score: 20    End of Session Equipment Utilized During Treatment: Rolling walker (2 wheels);Oxygen  OT Visit Diagnosis: Other abnormalities of gait and mobility (R26.89);History of falling (Z91.81);Muscle weakness (generalized) (M62.81)   Activity Tolerance Patient tolerated treatment well   Patient Left in bed;with call bell/phone within reach;with bed alarm set   Nurse Communication Mobility status        Time: 4081-4481 OT Time Calculation (min): 19 min  Charges: OT General Charges $OT Visit: 1 Visit OT Treatments $Self Care/Home Management : 8-22 mins    Jose [name] Jose [name] 11/10/2021, 1:51 PM

## 2021-11-10 NOTE — Progress Notes (Signed)
Pt was placed on Bipap to sleep.

## 2021-11-10 NOTE — Plan of Care (Signed)
  Problem: Education: Goal: Knowledge of disease or condition will improve Outcome: Progressing Goal: Knowledge of the prescribed therapeutic regimen will improve Outcome: Progressing Goal: Individualized Educational Video(s) Outcome: Progressing   Problem: Activity: Goal: Ability to tolerate increased activity will improve Outcome: Progressing Goal: Will verbalize the importance of balancing activity with adequate rest periods Outcome: Progressing   Problem: Respiratory: Goal: Ability to maintain a clear airway will improve Outcome: Progressing Goal: Levels of oxygenation will improve Outcome: Progressing Goal: Ability to maintain adequate ventilation will improve Outcome: Progressing   Problem: Education: Goal: Ability to describe self-care measures that may prevent or decrease complications (Diabetes Survival Skills Education) will improve Outcome: Progressing Goal: Individualized Educational Video(s) Outcome: Progressing   Problem: Coping: Goal: Ability to adjust to condition or change in health will improve Outcome: Progressing   Problem: Fluid Volume: Goal: Ability to maintain a balanced intake and output will improve Outcome: Progressing   Problem: Health Behavior/Discharge Planning: Goal: Ability to identify and utilize available resources and services will improve Outcome: Progressing Goal: Ability to manage health-related needs will improve Outcome: Progressing

## 2021-11-10 NOTE — Progress Notes (Signed)
ANTICOAGULATION CONSULT NOTE  Pharmacy Consult for Warfarin Indication:  hx VTE and Antiphospholipid antibody syndrome  Allergies  Allergen Reactions   Melatonin Anxiety   Altace [Ramipril] Cough    Patient Measurements: Height: 5\' 8"  (172.7 cm) Weight: 108.9 kg (240 lb) IBW/kg (Calculated) : 68.4  Vital Signs: Temp: 98 F (36.7 C) (08/28 0743) Temp Source: Oral (08/28 0743) BP: 118/63 (08/28 0743) Pulse Rate: 67 (08/28 0854)  Labs: Recent Labs    11/07/21 2058 11/08/21 0226 11/09/21 0112 11/10/21 0616  HGB 10.2* 9.3* 9.0*  --   HCT 34.3* 32.3* 29.2*  --   PLT 367 354 333  --   LABPROT  --  24.8* 24.9* 23.2*  INR  --  2.3* 2.3* 2.1*  CREATININE  --  1.07 1.22  --     Estimated Creatinine Clearance: 71.3 mL/min (by C-G formula based on SCr of 1.22 mg/dL).   Assessment: 66 y.o. M with medical history significant for prior PE and antiphospholipid antibody syndrome on Warfarin PTA who presented with AMS and respiratory distress. Pharmacy consulted to manage warfarin. INR therapeutic 2.1. No issues with bleeding reported. CBC stable. Continue with PTA regimen.   PTA warfarin regimen: 5 mg all days except 6 mg Monday and Friday  (confirmed with patient, has 5 and 1 mg tablets at home)  Goal of Therapy:  INR 2-3 Monitor platelets by anticoagulation protocol: Yes   Plan:  Give warfarin 6 mg PO x1 dose Check INR daily while on warfarin Continue to monitor H&H and platelets    Thank you for allowing pharmacy to be a part of this patient's care.  Friday, PharmD Clinical Pharmacist

## 2021-11-10 NOTE — Progress Notes (Signed)
Physical Therapy Treatment Patient Details Name: Jose [name] MRN: [id number] DOB: [birth year] Today's Date: 11/10/2021   History of Present Illness Pt is a 66 y/o M presenting with AMS and respiratory distress. 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    PT Comments    Pt making gradual progress. He did agree to ambulation in room today with min guard but fatigued easily.  He was on 4 L O2 with stable sats and BP was stable with transfers.  Did not perform further exercises as pt declined due to fatigue and his lunch arrived. Pt would benefit from supervision initially at home, and per eval wife works - may benefit from SNF to progress safety and endurance.     Recommendations for follow up therapy are one component of a multi-disciplinary discharge planning process, led by the attending physician.  Recommendations may be updated based on patient status, additional functional criteria and insurance authorization.  Follow Up Recommendations  Skilled nursing-short term rehab (<3 hours/day) Can patient physically be transported by private vehicle: Yes   Assistance Recommended at Discharge Intermittent Supervision/Assistance  Patient can return home with the following A little help with walking and/or transfers;A little help with bathing/dressing/bathroom;Assistance with cooking/housework;Assist for transportation;Help with stairs or ramp for entrance   Equipment Recommendations  None recommended by PT    Recommendations for Other Services       Precautions / Restrictions Precautions Precautions: Fall     Mobility  Bed Mobility Overal bed mobility: Needs Assistance Bed Mobility: Supine to Sit     Supine to sit: Supervision, HOB elevated     General bed mobility comments: heavy use of bed rails, increased effort     Transfers Overall transfer level: Needs assistance Equipment used: Rolling walker (2 wheels) Transfers: Sit to/from Stand Sit to Stand: Min guard           General transfer comment: minguard for safety and cues for safe hand placement,    Ambulation/Gait Ambulation/Gait assistance: Min guard Gait Distance (Feet): 20 Feet Assistive device: Rolling walker (2 wheels) Gait Pattern/deviations: Step-to pattern, Decreased stride length Gait velocity: decreased     General Gait Details: Fatigued easily; declined further ambulation in hallway   Stairs             Wheelchair Mobility    Modified Rankin (Stroke Patients Only)       Balance Overall balance assessment: Needs assistance Sitting-balance support: Feet supported Sitting balance-[name] Scale: Good     Standing balance support: Bilateral upper extremity supported, Reliant on assistive device for balance Standing balance-[name] Scale: Poor Standing balance comment: uses RW                            Cognition Arousal/Alertness: Awake/alert Behavior During Therapy: WFL for tasks assessed/performed Overall Cognitive Status: Within Functional Limits for tasks assessed                                          Exercises Other Exercises Other Exercises: Lunch arrived during therapy, denied further exercises    General Comments General comments (skin integrity, edema, etc.): BP 126/59 sitting, 109/64 standing; denied any dizziness; on 4 L O2 with sats 94% or greater  Pertinent Vitals/Pain Pain Assessment Pain Assessment: No/denies pain    Home Living                          Prior Function            PT Goals (current goals can now be found in the care plan section) Progress towards PT goals: Progressing toward goals    Frequency    Min 3X/week      PT Plan Current plan remains appropriate    Co-evaluation              AM-PAC PT "6  Clicks" Mobility   Outcome Measure  Help needed turning from your back to your side while in a flat bed without using bedrails?: A Little Help needed moving from lying on your back to sitting on the side of a flat bed without using bedrails?: A Little Help needed moving to and from a bed to a chair (including a wheelchair)?: A Little Help needed standing up from a chair using your arms (e.g., wheelchair or bedside chair)?: A Little Help needed to walk in hospital room?: A Little Help needed climbing 3-5 steps with a railing? : Total 6 Click Score: 16    End of Session Equipment Utilized During Treatment: Oxygen;Gait belt Activity Tolerance: Patient limited by fatigue (also his lunch arrived and denied further activity) Patient left: in chair;with call bell/phone within reach;with chair alarm set Nurse Communication: Mobility status PT Visit Diagnosis: Unsteadiness on feet (R26.81);Other abnormalities of gait and mobility (R26.89);Muscle weakness (generalized) (M62.81)     Time: 7322-0254 PT Time Calculation (min) (ACUTE ONLY): 21 min  Charges:  $Gait Training: 8-22 mins                     Anise Salvo, PT Acute Rehab [facility] [phone]    Jose [name] 11/10/2021, 12:09 PM

## 2021-11-11 DIAGNOSIS — I25810 Atherosclerosis of coronary artery bypass graft(s) without angina pectoris: Secondary | ICD-10-CM | POA: Diagnosis not present

## 2021-11-11 DIAGNOSIS — E8809 Other disorders of plasma-protein metabolism, not elsewhere classified: Secondary | ICD-10-CM | POA: Diagnosis not present

## 2021-11-11 DIAGNOSIS — N179 Acute kidney failure, unspecified: Secondary | ICD-10-CM | POA: Diagnosis not present

## 2021-11-11 DIAGNOSIS — M6281 Muscle weakness (generalized): Secondary | ICD-10-CM | POA: Diagnosis not present

## 2021-11-11 DIAGNOSIS — J9621 Acute and chronic respiratory failure with hypoxia: Secondary | ICD-10-CM | POA: Diagnosis not present

## 2021-11-11 DIAGNOSIS — G4089 Other seizures: Secondary | ICD-10-CM | POA: Diagnosis not present

## 2021-11-11 DIAGNOSIS — Z86711 Personal history of pulmonary embolism: Secondary | ICD-10-CM | POA: Diagnosis not present

## 2021-11-11 DIAGNOSIS — J441 Chronic obstructive pulmonary disease with (acute) exacerbation: Secondary | ICD-10-CM | POA: Diagnosis not present

## 2021-11-11 DIAGNOSIS — D6861 Antiphospholipid syndrome: Secondary | ICD-10-CM | POA: Diagnosis not present

## 2021-11-11 DIAGNOSIS — E1169 Type 2 diabetes mellitus with other specified complication: Secondary | ICD-10-CM | POA: Diagnosis not present

## 2021-11-11 DIAGNOSIS — I10 Essential (primary) hypertension: Secondary | ICD-10-CM | POA: Diagnosis not present

## 2021-11-11 DIAGNOSIS — K219 Gastro-esophageal reflux disease without esophagitis: Secondary | ICD-10-CM | POA: Diagnosis not present

## 2021-11-11 DIAGNOSIS — N401 Enlarged prostate with lower urinary tract symptoms: Secondary | ICD-10-CM | POA: Diagnosis not present

## 2021-11-11 DIAGNOSIS — F339 Major depressive disorder, recurrent, unspecified: Secondary | ICD-10-CM | POA: Diagnosis not present

## 2021-11-11 DIAGNOSIS — I5032 Chronic diastolic (congestive) heart failure: Secondary | ICD-10-CM | POA: Diagnosis not present

## 2021-11-11 DIAGNOSIS — J9622 Acute and chronic respiratory failure with hypercapnia: Secondary | ICD-10-CM | POA: Diagnosis not present

## 2021-11-11 DIAGNOSIS — E785 Hyperlipidemia, unspecified: Secondary | ICD-10-CM | POA: Diagnosis not present

## 2021-11-11 DIAGNOSIS — Z79899 Other long term (current) drug therapy: Secondary | ICD-10-CM | POA: Diagnosis not present

## 2021-11-11 LAB — CBC
HCT: 32.3 % — ABNORMAL LOW (ref 39.0–52.0)
Hemoglobin: 9.7 g/dL — ABNORMAL LOW (ref 13.0–17.0)
MCH: 28.2 pg (ref 26.0–34.0)
MCHC: 30 g/dL (ref 30.0–36.0)
MCV: 93.9 fL (ref 80.0–100.0)
Platelets: 357 10*3/uL (ref 150–400)
RBC: 3.44 MIL/uL — ABNORMAL LOW (ref 4.22–5.81)
RDW: 14.1 % (ref 11.5–15.5)
WBC: 11.4 10*3/uL — ABNORMAL HIGH (ref 4.0–10.5)
nRBC: 0 % (ref 0.0–0.2)

## 2021-11-11 LAB — BASIC METABOLIC PANEL
Anion gap: 8 (ref 5–15)
BUN: 44 mg/dL — ABNORMAL HIGH (ref 8–23)
CO2: 35 mmol/L — ABNORMAL HIGH (ref 22–32)
Calcium: 8.9 mg/dL (ref 8.9–10.3)
Chloride: 98 mmol/L (ref 98–111)
Creatinine, Ser: 1.4 mg/dL — ABNORMAL HIGH (ref 0.61–1.24)
GFR, Estimated: 55 mL/min — ABNORMAL LOW (ref >60)
Glucose, Bld: 129 mg/dL — ABNORMAL HIGH (ref 70–99)
Potassium: 4.2 mmol/L (ref 3.5–5.1)
Sodium: 141 mmol/L (ref 135–145)

## 2021-11-11 LAB — PROTIME-INR
INR: 2 — ABNORMAL HIGH (ref 0.8–1.2)
Prothrombin Time: 22.3 seconds — ABNORMAL HIGH (ref 11.4–15.2)

## 2021-11-11 LAB — GLUCOSE, CAPILLARY
Glucose-Capillary: 103 mg/dL — ABNORMAL HIGH (ref 70–99)
Glucose-Capillary: 118 mg/dL — ABNORMAL HIGH (ref 70–99)

## 2021-11-11 MED ORDER — PREDNISONE 10 MG PO TABS: 30.0000 mg | ORAL_TABLET | Freq: Every day | ORAL | 0 refills | Status: AC

## 2021-11-11 MED ORDER — WARFARIN SODIUM 5 MG PO TABS
5.0000 mg | ORAL_TABLET | Freq: Once | ORAL | Status: AC
  Administered 2021-11-11: 5 mg via ORAL
  Filled 2021-11-11: qty 1

## 2021-11-11 NOTE — TOC Progression Note (Addendum)
Transition of Care [facility]) - Progression Note    Patient Details  Name: Jose [name] MRN: [id number] Date of Birth: Jul 16, 1955  Transition of Care [facility]) CM/SW Contact  Lorri Frederick, LCSW Phone Number: 11/11/2021, 12:56 PM  Clinical Narrative:   [facility] Harvey approved: [id number], 3 days: 8/28-8/30. MD informed.  CSW spoke with wife regarding providing transport and she does want to transport pt, will need him brought down to the front entrance.  Expected Discharge Plan: Home w Home Health Services (vs SNF) Barriers to Discharge: No Barriers Identified  Expected Discharge Plan and Services Expected Discharge Plan: Home w Home Health Services (vs SNF)   Discharge Planning Services: CM Consult Post Acute Care Choice: Durable Medical Equipment (bipap,oxygen, nebulizer, cane ,walker) Living arrangements for the past 2 months: Mobile Home Expected Discharge Date: 11/07/21                         HH Arranged: RN, Nurse's Aide HH Agency: [facility] Date HH Agency Contacted: 11/07/21 Time HH Agency Contacted: 1022 Representative spoke with at [facility] Agency: Morrie Sheldon   Social Determinants of Health (SDOH) Interventions    Readmission Risk Interventions    07/16/2020   12:39 PM  Readmission Risk Prevention Plan  Transportation Screening Complete  PCP or Specialist Appt within 3-5 Days Complete  HRI or Home Care Consult Complete  Social Work Consult for Recovery Care Planning/Counseling Complete  Palliative Care Screening Not Applicable  Medication Review Oceanographer) Complete

## 2021-11-11 NOTE — TOC Transition Note (Addendum)
Transition of Care [facility]) - CM/SW Discharge Note   Patient Details  Name: Jose [name] MRN: [id number] Date of Birth: 10/27/1955  Transition of Care [facility]) CM/SW Contact:  Lorri Frederick, LCSW Phone Number: 11/11/2021, 1:32 PM   Clinical Narrative:   Pt discharging to [facility].  RN call [phone] for report.  Wife, Galen Daft, will provide transportation to SNF.  1435: Bipap settings from [place]/respiratory: IPAP 22, EPAP 8, respiratory rate 20, 40% FIO2.  This was provided to [facility].  Final next level of care: Skilled Nursing Facility Barriers to Discharge: Barriers Resolved   Patient Goals and CMS Choice     Choice offered to / list presented to : Patient, Spouse  Discharge Placement              Patient chooses bed at:  [facility]) Patient to be transferred to facility by: by wife Gaye Name of family member notified: wife Gaye Patient and family notified of of transfer: 11/11/21  Discharge Plan and Services   Discharge Planning Services: CM Consult Post Acute Care Choice: Durable Medical Equipment (bipap,oxygen, nebulizer, cane ,walker)                    HH Arranged: RN, Nurse's Aide HH Agency: [facility] Date HH Agency Contacted: 11/07/21 Time HH Agency Contacted: 1022 Representative spoke with at [facility] Agency: Morrie Sheldon  Social Determinants of Health (SDOH) Interventions     Readmission Risk Interventions    07/16/2020   12:39 PM  Readmission Risk Prevention Plan  Transportation Screening Complete  PCP or Specialist Appt within 3-5 Days Complete  HRI or Home Care Consult Complete  Social Work Consult for Recovery Care Planning/Counseling Complete  Palliative Care Screening Not Applicable  Medication Review Oceanographer) Complete

## 2021-11-11 NOTE — Discharge Summary (Signed)
Physician Discharge Summary  [name] UJW:[id number] DOB: 10/23/1955 DOA: 11/05/2021  PCP: [name], MD  Admit date: 11/05/2021 Discharge date: 11/11/2021  Admitted From: Home  Discharge disposition: Skilled nursing facility   Recommendations for Outpatient Follow-Up:   Follow up with your primary care provider at the skilled nursing facility in 3 to 5 days. Continue BiPAP at nighttime and during daytime while napping. Continue Coumadin monitoring with INR for INR goal of 2-3 Check CBC, BMP, LFT, magnesium in the next visit   Discharge Diagnosis:   Principal Problem:   Acute on chronic respiratory failure with hypercapnia (HCC) Active Problems:   History of pulmonary embolus (PE)-2011   Type 2 diabetes mellitus with hyperlipidemia (HCC)   S/P CABG x 3   Prolonged QT interval   Obesity (BMI 30-39.9)   AKI (acute kidney injury) (HCC)   COPD with acute exacerbation (HCC)   Discharge Condition: Improved.  Diet recommendation: Low sodium, heart healthy.  Diabetic  Wound care: None.  Code status: Full.   History of Present Illness:   Jose [name] is a 66 y.o. 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  Patient reported increasing shortness of breath for last 2 weeks with cough and had stopped using his trilogy ventilator for at least 1 year due to loud noise.  He was compliant with bronchodilators.  He has relapsed into smoking as well.  In the ED, patient was mildly hypotensive.  ABG showed pH of 7.132, CO2 of 108 and was placed on BiPAP.  After 2 hours, he had improvement to pH of 7.235, 77 CO2. PCCM was consulted for this.  Patient  was then admitted to hospital for further evaluation and treatment. During hospitalization, patient's mental status improved with BiPAP and was then considered for disposition home but family had concerns about his falls, weakness debility and requested PT evaluation.  PT has recommended skilled nursing facility placement at this time.  On 11/07/2021, patient was again confused and disoriented.  Compliance with BiPAP has improved during hospitalization after multiple counseling.  At this time patient is medically stable for disposition to skilled nursing facility with BiPAP.  Hospital Course:   Following conditions were addressed during hospitalization as listed below,  Acute on chronic respiratory failure with hypercapnia and hypoxia secondary to COPD exacerbation with ongoing tobacco use.   Patient uses trilogy at home.  Patient is on 4 to 6 liters of oxygen at baseline.  Follows up with pulmonary at [facility] but has not seen them in 1 year.  PCCM was  consulted during hospitalization.  Was initially on Solu-Medrol which has been changed to oral prednisone at this time.  No obvious source of infection..  Completed doxycycline course as well. Procalcitonin was less than 0.1.  Patient was emphasized the need for being on BiPAP/status especially during the nighttime and as best as possible during the daytime while taking a nap.  Patient understands the need for BiPAP.  Currently stable for disposition to skilled nursing facility with BiPAP.  Patient will resume trilogy once at home.   AKI (acute kidney injury) (HCC) Improved.  Latest creatinine of 1. 4    S/P CABG x 3 Asymptomatic stable.  No chest  pain.   Type 2 diabetes mellitus with hyperlipidemia (HCC) Hemoglobin A1c on 8/24 at 6.5.    Latest POC glucose of 103.  On Amaryl at home.   History of pulmonary embolus (PE)-2011 with history of antiphospholipid antibody syndrome Continue warfarin.  Latest INR of 2.0.  Therapeutic at this time    Ambulatory dysfunction/recurrent falls.   History of falls at home with episodes of lethargy and incontinence.  Patient's wife unable to take care of him and physical therapy has recommended skilled nursing facility placement at this time  Disposition.  At this time, patient is stable for disposition to skilled nursing facility with outpatient PCP and pulmonary follow-up..  Medical Consultants:   PCCM  Procedures:    BiPAP placement Subjective:   Today, patient was seen and examined at bedside.  Patient states that he has been using his BiPAP at nighttime for a prolonged duration now.  Denies pain, nausea, vomiting, fever, increasing shortness of breath cough or chills.      Discharge Exam:   Vitals:   11/11/21 0757 11/11/21 0900  BP: 116/67 (!) 113/56  Pulse: 70 67  Resp: 13 (!) 21  Temp: 98 F (36.7 C) 98 F (36.7 C)  SpO2: 99% 98%   Vitals:   11/10/21 2336 11/11/21 0432 11/11/21 0757 11/11/21 0900  BP:  118/74 116/67 (!) 113/56  Pulse: 72 (!) 58 70 67  Resp: 18 15 13  (!) 21  Temp:  97.6 F (36.4 C) 98 F (36.7 C) 98 F (36.7 C)  TempSrc:   Oral Oral  SpO2:  100% 99% 98%  Weight:      Height:      Body mass index is 36.49 kg/m.  General: Obese built, not in obvious distress on nasal cannula oxygen HENT:   No scleral pallor or icterus noted. Oral mucosa is moist.  Chest:  Diminished breath sounds bilaterally. No crackles or wheezes.  CVS: S1 &S2 heard. No murmur.  Regular rate and rhythm. Abdomen: Soft, nontender, nondistended.  Bowel sounds are heard.   Extremities: No cyanosis, clubbing or edema.  Peripheral pulses are palpable. Psych: Alert, awake and oriented, normal mood CNS:  No cranial nerve deficits.  Moves all extremities. Skin: Warm and dry.  No rashes noted.  The results of significant diagnostics from this hospitalization (including imaging, microbiology, ancillary and laboratory) are listed below for reference.     Diagnostic Studies:   DG  Chest Port 1 View  Result Date: 11/05/2021 CLINICAL DATA:  Dyspnea EXAM: PORTABLE CHEST 1 VIEW COMPARISON:  10/28/2021 FINDINGS: Lungs are clear. No pneumothorax or pleural effusion. Coronary artery bypass grafting has been performed. Cardiac size within normal limits. Pulmonary vascularity is normal. IMPRESSION: No active disease. Electronically Signed   By: Fidela Salisbury M.D.   On: 11/05/2021 21:16     Labs:   Basic Metabolic Panel: Recent Labs  Lab 11/06/21 0001 11/06/21 0241 11/07/21 0157 11/08/21 0226 11/09/21 0112 11/11/21 0143  NA 142 141 142 145 140 141  K 5.4* 5.0 4.3 5.1 4.3 4.2  CL 106  --  103 104 98 98  CO2 30  --  33* 37* 35* 35*  GLUCOSE 143*  --  163* 88 94 129*  BUN 17  --  16 24* 30* 44*  CREATININE 1.16  --  1.05 1.07 1.22 1.40*  CALCIUM 8.5*  --  8.7* 8.9 8.6* 8.9  MG  --   --  2.4 2.0  --   --  GFR Estimated Creatinine Clearance: 62.1 mL/min (A) (by C-G formula based on SCr of 1.4 mg/dL (H)). Liver Function Tests: Recent Labs  Lab 11/05/21 2043  AST 17  ALT 22  ALKPHOS 70  BILITOT 0.3  PROT 7.8  ALBUMIN 3.3*   No results for input(s): "LIPASE", "AMYLASE" in the last 168 hours. No results for input(s): "AMMONIA" in the last 168 hours. Coagulation profile Recent Labs  Lab 11/07/21 1154 11/08/21 0226 11/09/21 0112 11/10/21 0616 11/11/21 0143  INR 2.3* 2.3* 2.3* 2.1* 2.0*    CBC: Recent Labs  Lab 11/05/21 2043 11/05/21 2052 11/07/21 0157 11/07/21 2058 11/08/21 0226 11/09/21 0112 11/11/21 0143  WBC 12.3*   < > 13.4* 13.1* 11.0* 11.0* 11.4*  NEUTROABS 8.1*  --   --  9.6*  --   --   --   HGB 10.5*   < > 10.4* 10.2* 9.3* 9.0* 9.7*  HCT 36.9*   < > 35.5* 34.3* 32.3* 29.2* 32.3*  MCV 101.1*   < > 98.9 99.4 100.0 94.5 93.9  PLT 395   < > 392 367 354 333 357   < > = values in this interval not displayed.   Cardiac Enzymes: No results for input(s): "CKTOTAL", "CKMB", "CKMBINDEX", "TROPONINI" in the last 168 hours. BNP: Invalid  input(s): "POCBNP" CBG: Recent Labs  Lab 11/10/21 1122 11/10/21 1646 11/10/21 2140 11/11/21 0747 11/11/21 1135  GLUCAP 118* 200* 188* 103* 118*   D-Dimer No results for input(s): "DDIMER" in the last 72 hours. Hgb A1c No results for input(s): "HGBA1C" in the last 72 hours. Lipid Profile No results for input(s): "CHOL", "HDL", "LDLCALC", "TRIG", "CHOLHDL", "LDLDIRECT" in the last 72 hours. Thyroid function studies No results for input(s): "TSH", "T4TOTAL", "T3FREE", "THYROIDAB" in the last 72 hours.  Invalid input(s): "FREET3" Anemia work up No results for input(s): "VITAMINB12", "FOLATE", "FERRITIN", "TIBC", "IRON", "RETICCTPCT" in the last 72 hours. Microbiology Recent Results (from the past 240 hour(s))  Resp Panel by RT-PCR (Flu A&B, Covid) Anterior Nasal Swab     Status: None   Collection Time: 11/05/21  8:31 PM   Specimen: Anterior Nasal Swab  Result Value Ref Range Status   SARS Coronavirus 2 by RT PCR NEGATIVE NEGATIVE Final    Comment: (NOTE) SARS-CoV-2 target nucleic acids are NOT DETECTED.  The SARS-CoV-2 RNA is generally detectable in upper respiratory specimens during the acute phase of infection. The lowest concentration of SARS-CoV-2 viral copies this assay can detect is 138 copies/mL. A negative result does not preclude SARS-Cov-2 infection and should not be used as the sole basis for treatment or other patient management decisions. A negative result may occur with  improper specimen collection/handling, submission of specimen other than nasopharyngeal swab, presence of viral mutation(s) within the areas targeted by this assay, and inadequate number of viral copies(<138 copies/mL). A negative result must be combined with clinical observations, patient history, and epidemiological information. The expected result is Negative.  Fact Sheet for Patients:  EntrepreneurPulse.com.au  Fact Sheet for Healthcare Providers:   IncredibleEmployment.be  This test is no t yet approved or cleared by the [place] FDA and  has been authorized for detection and/or diagnosis of SARS-CoV-2 by FDA under an Emergency Use Authorization (EUA). This EUA will remain  in effect (meaning this test can be used) for the duration of the COVID-19 declaration under Section 564(b)(1) of the Act, 21 U.S.C.section 360bbb-3(b)(1), unless the authorization is terminated  or revoked sooner.       Influenza A by  PCR NEGATIVE NEGATIVE Final   Influenza B by PCR NEGATIVE NEGATIVE Final    Comment: (NOTE) The Xpert Xpress SARS-CoV-2/FLU/RSV plus assay is intended as an aid in the diagnosis of influenza from Nasopharyngeal swab specimens and should not be used as a sole basis for treatment. Nasal washings and aspirates are unacceptable for Xpert Xpress SARS-CoV-2/FLU/RSV testing.  Fact Sheet for Patients: BloggerCourse.com  Fact Sheet for Healthcare Providers: SeriousBroker.it  This test is not yet approved or cleared by the [place] FDA and has been authorized for detection and/or diagnosis of SARS-CoV-2 by FDA under an Emergency Use Authorization (EUA). This EUA will remain in effect (meaning this test can be used) for the duration of the COVID-19 declaration under Section 564(b)(1) of the Act, 21 U.S.C. section 360bbb-3(b)(1), unless the authorization is terminated or revoked.  Performed at [facility], [address] [place], [place] [address]   MRSA Next Gen by PCR, Nasal     Status: None   Collection Time: 11/05/21  8:41 PM   Specimen: Nasal Swab  Result Value Ref Range Status   MRSA by PCR Next Gen NOT DETECTED NOT DETECTED Final    Comment: (NOTE) The GeneXpert MRSA Assay (FDA approved for NASAL specimens only), is one component of a comprehensive MRSA colonization surveillance program. It is not intended to diagnose MRSA infection  nor to guide or monitor treatment for MRSA infections. Test performance is not FDA approved in patients less than 67 years old. Performed at [facility], [address] [place], [place] [address]      Discharge Instructions:   Discharge Instructions     Diet Carb Modified   Complete by: As directed    Discharge instructions   Complete by: As directed    Follow-up with your primary care provider or pulmonary care physician in 1 week.  Please continue to use trilogy at home without interruption and use oxygen during the daytime..  Take prednisone and antibiotic as prescribed.  Seek medical attention for worsening symptoms.  No smoking.   Discharge instructions   Complete by: As directed    Follow-up with your primary care provider at the skilled substitute in 3 to 5 days.  Follow-up with pulmonary physician at [facility] in 2 to 3 weeks/as scheduled by you.  Continue to use BiPAP at the rehab facility and use trilogy at home.   Increase activity slowly   Complete by: As directed    Increase activity slowly   Complete by: As directed       Allergies as of 11/11/2021       Reactions   Melatonin Anxiety   Altace [ramipril] Cough        Medication List     TAKE these medications    acetaminophen 650 MG CR tablet Commonly known as: TYLENOL Take 1,950 mg by mouth every 8 (eight) hours as needed for pain.   albuterol 108 (90 Base) MCG/ACT inhaler Commonly known as: VENTOLIN HFA Inhale 2 puffs into the lungs every 6 (six) hours as needed for wheezing or shortness of breath.   albuterol (5 MG/ML) 0.5% nebulizer solution Commonly known as: PROVENTIL Take 0.5 mLs (2.5 mg total) by nebulization every 6 (six) hours as needed for wheezing or shortness of breath.   aspirin EC 81 MG tablet Take 1 tablet (81 mg total) by mouth daily. Swallow whole.   DULoxetine 60 MG capsule Commonly known as: CYMBALTA Take 60 mg by mouth daily. Take one capsule (  60 mg) along with 30 mg  capsule to equal 90 mg daily   DULoxetine 30 MG capsule Commonly known as: CYMBALTA Take 30 mg by mouth daily. Take one capsule (30 mg) daily along with 60 mg capsule to equal 90 mg daily   gabapentin 600 MG tablet Commonly known as: NEURONTIN Take 600 mg by mouth 3 (three) times daily.   glimepiride 4 MG tablet Commonly known as: AMARYL Take 4 mg by mouth 2 (two) times daily.   ipratropium-albuterol 0.5-2.5 (3) MG/3ML Soln Commonly known as: DUONEB Take 3 mLs by nebulization every 4 (four) hours as needed (For shortness of breath or wheezing).   metoprolol tartrate 12.5 mg Tabs tablet Commonly known as: LOPRESSOR Take 12.5 mg by mouth 2 (two) times daily.   nitroGLYCERIN 0.4 MG SL tablet Commonly known as: NITROSTAT Place 0.4 mg under the tongue every 5 (five) minutes as needed for chest pain.   OXYGEN Inhale 4-6 L into the lungs daily.   pantoprazole 40 MG tablet Commonly known as: PROTONIX Take 40 mg by mouth 2 (two) times daily.   predniSONE 10 MG tablet Commonly known as: DELTASONE Take 3 tablets (30 mg total) by mouth daily with breakfast for 2 days. Start taking on: November 12, 2021   rosuvastatin 10 MG tablet Commonly known as: CRESTOR Take 10 mg by mouth every evening.   tamsulosin 0.4 MG Caps capsule Commonly known as: FLOMAX Take 0.8 mg by mouth daily.   tiZANidine 4 MG tablet Commonly known as: ZANAFLEX Take 4 mg by mouth 3 (three) times daily as needed for muscle spasms.   topiramate 50 MG tablet Commonly known as: TOPAMAX Take 75 mg by mouth 2 (two) times daily.   torsemide 20 MG tablet Commonly known as: DEMADEX Take 20 mg by mouth daily.   Trelegy Ellipta 200-62.5-25 MCG/ACT Aepb Generic drug: Fluticasone-Umeclidin-Vilant Inhale 1 puff into the lungs daily.   warfarin 6 MG tablet Commonly known as: COUMADIN Take 5 mg by mouth See admin instructions. Take one tablet (5 mg) Tuesday, Wednesday, Thursday, Saturday and Sunday Then take (6 mg)  Monday and Friday        Contact information for follow-up providers     Elenore Paddy, MD Follow up.   Specialty: Internal Medicine Contact information: [place] [address] [phone]              Contact information for after-discharge care     Destination     [facility] Preferred SNF .   Service: Skilled Nursing Contact information: [address] [place] [phone]                      Time coordinating discharge: 39 minutes  Signed:  Diyan Dave  [facility] 11/11/2021, 1:07 PM

## 2021-11-11 NOTE — Progress Notes (Signed)
Mobility Specialist Progress Note   11/11/21 1445  Mobility  Activity Ambulated with assistance in room  Level of Assistance Contact guard assist, steadying assist  Assistive Device Front wheel walker  Distance Ambulated (ft) 56 ft  Activity Response Tolerated well  $Mobility charge 1 Mobility   Pre Mobility: 82 HR, 118/61 BP, 100% SpO2 During Mobility: 94 HR, 88% - 94%  SpO2 Post Mobility: 79 HR, 130/72 BP, 97% SpO2  Received pt in bed on 5LO2 having no complaints. Pt able to ambulate in room w/o physical assistance and able to maintain an SpO2 >88% on 4LO2. Returned back to bed w/o fault, call bell in reach and bed alarm on.   Frederico Hamman Mobility Specialist MS [facility] #:  [phone] Acute Rehab Office:  [phone]

## 2021-11-11 NOTE — Progress Notes (Signed)
Physical Therapy Treatment Patient Details Name: Jose [name] MRN: [id number] DOB: Dec 02, 1955 Today's Date: 11/11/2021   History of Present Illness Pt is a 66 y/o M presenting with AMS and respiratory distress. 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    PT Comments    Pt was seen for progressing RW gait and review of exercises with poor walking endurance but safe with use of walker.  He is getting up to walk with 4L O2 and able to maintain sats in mid to high 90's, BP sitting was 115/63, post walk was 112/65.  Follow along with him to progress as needed.  Pt is still discussing his decision with SNF care, suggesting he might want to go home.  Decision has been made with his spouse for care, and referred to SW to follow up with pt about his concerns.  Continue to encourage OOB and walking as tolerated, including with nursing staff.  Recommendations for follow up therapy are one component of a multi-disciplinary discharge planning process, led by the attending physician.  Recommendations may be updated based on patient status, additional functional criteria and insurance authorization.  Follow Up Recommendations  Skilled nursing-short term rehab (<3 hours/day) Can patient physically be transported by private vehicle: Yes   Assistance Recommended at Discharge Intermittent Supervision/Assistance  Patient can return home with the following A little help with walking and/or transfers;A little help with bathing/dressing/bathroom;Assistance with cooking/housework;Assist for transportation;Help with stairs or ramp for entrance   Equipment Recommendations  None recommended by PT    Recommendations for Other Services       Precautions / Restrictions Precautions Precautions: Fall Precaution Comments: monitor sats and  BP Restrictions Weight Bearing Restrictions: No     Mobility  Bed Mobility Overal bed mobility: Needs Assistance Bed Mobility: Supine to Sit, Sit to Supine     Supine to sit: Min assist Sit to supine: Mod assist   General bed mobility comments: pt could not get his legs onto bed    Transfers Overall transfer level: Needs assistance Equipment used: Rolling walker (2 wheels) Transfers: Sit to/from Stand Sit to Stand: Supervision                Ambulation/Gait Ambulation/Gait assistance: Min guard Gait Distance (Feet): 40 Feet Assistive device: Rolling walker (2 wheels) Gait Pattern/deviations: Step-through pattern, Step-to pattern, Decreased stride length, Wide base of support Gait velocity: decreased Gait velocity interpretation: <1.31 ft/sec, indicative of household ambulator Pre-gait activities: vitals and balance ck General Gait Details: pt is getting fatigued but on O2 4L with sats maintained, no fluctuations of BP to account for fatigue   Stairs             Wheelchair Mobility    Modified Rankin (Stroke Patients Only)       Balance Overall balance assessment: Needs assistance Sitting-balance support: Feet supported Sitting balance-[name] Scale: Good     Standing balance support: Bilateral upper extremity supported, During functional activity Standing balance-[name] Scale: Fair                              Cognition Arousal/Alertness: Awake/alert Behavior During Therapy: WFL for tasks assessed/performed Overall Cognitive Status: Within Functional Limits for tasks assessed  Exercises General Exercises - Lower Extremity Ankle Circles/Pumps: AROM, 5 reps Quad Sets: AROM, 10 reps Gluteal Sets: AROM, 10 reps Heel Slides: AROM, 10 reps Hip ABduction/ADduction: AROM, 10 reps    General Comments General comments (skin integrity, edema, etc.): 97% sitting and post gait 95%       Pertinent Vitals/Pain Pain Assessment Pain Assessment: No/denies pain    Home Living                          Prior Function            PT Goals (current goals can now be found in the care plan section) Acute Rehab PT Goals Patient Stated Goal: decide about rehab Progress towards PT goals: Progressing toward goals    Frequency    Min 3X/week      PT Plan Current plan remains appropriate    Co-evaluation              AM-PAC PT "6 Clicks" Mobility   Outcome Measure  Help needed turning from your back to your side while in a flat bed without using bedrails?: A Little Help needed moving from lying on your back to sitting on the side of a flat bed without using bedrails?: A Little Help needed moving to and from a bed to a chair (including a wheelchair)?: A Little Help needed standing up from a chair using your arms (e.g., wheelchair or bedside chair)?: A Little Help needed to walk in hospital room?: A Little Help needed climbing 3-5 steps with a railing? : A Lot 6 Click Score: 17    End of Session Equipment Utilized During Treatment: Oxygen;Gait belt Activity Tolerance: Patient limited by fatigue Patient left: in bed;with call bell/phone within reach;with bed alarm set Nurse Communication: Mobility status PT Visit Diagnosis: Unsteadiness on feet (R26.81);Muscle weakness (generalized) (M62.81);Difficulty in walking, not elsewhere classified (R26.2)     Time: 7062-3762 PT Time Calculation (min) (ACUTE ONLY): 34 min  Charges:  $Gait Training: 8-22 mins $Therapeutic Exercise: 8-22 mins     Ivar Drape 11/11/2021, 12:14 PM  Samul Dada, PT PhD Acute Rehab Dept. Number: [facility] [id number] and [facility] [phone]

## 2021-11-11 NOTE — Progress Notes (Signed)
PROGRESS NOTE    Jose [name]  OMV:[id number] DOB: 22-Sep-1955 DOA: 11/05/2021 PCP: [name], MD    Brief Narrative:    Jose [name] is a 66 y.o. 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  Patient reported increasing shortness of breath for last 2 weeks with cough and had stopped using his trilogy ventilator for at least 1 year due to loud noise.  He was compliant with bronchodilators.  He has relapsed into smoking as well.  In the ED, patient was mildly hypotensive.  ABG showed pH of 7.132, CO2 of 108 and was placed on BiPAP.  After 2 hours, he had improvement to pH of 7.235, 77 CO2. PCCM was consulted for this.  Patient was then admitted to hospital for further evaluation and treatment.  During hospitalization, patient's mental status improved with BiPAP and was then considered for disposition home but family had concerns about his falls, weakness debility and requested PT evaluation.  PT has recommended skilled nursing facility placement at this time.  On 11/07/2021, patient was again confused and disoriented.  Compliance with BiPAP has improved during hospitalization after multiple counseling.  At this time patient is medically stable for disposition to skilled nursing facility with BiPAP.  Assessment and Plan:  Principal Problem:   Acute on chronic respiratory failure with hypercapnia (HCC) Active Problems:   History of pulmonary embolus (PE)-2011   Type 2 diabetes mellitus with hyperlipidemia (HCC)   S/P CABG x 3   Prolonged QT interval   Obesity (BMI 30-39.9)   AKI (acute kidney injury) (HCC)   COPD with acute exacerbation (HCC)  Acute on chronic  respiratory failure with hypercapnia and hypoxia secondary to COPD exacerbation with ongoing tobacco use.   Patient uses trilogy at home.  Patient is on 4 to 6 liters of oxygen at baseline.  Follows up with pulmonary at [facility] but has not seen them in 1 year.  PCCM was  consulted during hospitalization.  Was initially on Solu-Medrol which has been changed to oral prednisone at this time.  No obvious source of infection..  Completed doxycycline course as well. Procalcitonin was less than 0.1.  Patient was emphasized the need for being on BiPAP/status especially during the nighttime and as best as possible during the daytime while taking a nap.  Patient understands the need for BiPAP.  Currently planning for going to skilled nursing facility with BiPAP.  Pending placement.   AKI (acute kidney injury) (HCC) Improved.  Latest creatinine of 1. 4    S/P CABG x 3 Asymptomatic stable.  No chest pain.   Type 2 diabetes mellitus with hyperlipidemia (HCC) Hemoglobin A1c on 8/24 at 6.5.  Continue sliding scale insulin.  Latest POC glucose of 103   History of pulmonary embolus (PE)-2011 with history of antiphospholipid antibody syndrome Continue warfarin.  Latest INR of 2.0.  Therapeutic at this time  Ambulatory dysfunction/recurrent falls.   History of falls at home with episodes of lethargy and incontinence.  Patient's wife unable to take care of him at home and requesting rehabilitation.  Physical therapy has recommended skilled nursing facility placement at this time.  Pending skilled nursing facility placement.   DVT prophylaxis:   Warfarin  Code Status:  Code Status: Full Code  Disposition:   PT OT evaluation recommending skilled nursing facility.  Communicated with TOC for need of BiPAP on discharge.  Medically stable for disposition to nursing home  Status is: Inpatient  Remains inpatient appropriate because: Chronic respiratory failure,debility weakness falls, need for rehabitation    Family Communication:  I spoke with the patient's spouse on the phone on 11/08/2021  Consultants:  PCCM  Procedures:  BiPAP placement  Antimicrobials:  Doxycycline-completed course  Subjective: Today, patient was seen and examined at bedside.  Patient states that he has been using his BiPAP at nighttime for a prolonged duration now.  Denies pain, nausea, vomiting, fever, increasing shortness of breath cough or chills.    Objective: Vitals:   11/10/21 2336 11/11/21 0432 11/11/21 0757 11/11/21 0900  BP:  118/74 116/67 (!) 113/56  Pulse: 72 (!) 58 70 67  Resp: 18 15 13  (!) 21  Temp:  97.6 F (36.4 C) 98 F (36.7 C) 98 F (36.7 C)  TempSrc:   Oral Oral  SpO2:  100% 99% 98%  Weight:      Height:        Intake/Output Summary (Last 24 hours) at 11/11/2021 1133 Last data filed at 11/11/2021 1000 Gross per 24 hour  Intake 480 ml  Output 2400 ml  Net -1920 ml    Filed Weights   11/05/21 2029  Weight: 108.9 kg    Physical Examination: Body mass index is 36.49 kg/m.   General: Obese built, not in obvious distress on nasal cannula oxygen HENT:   No scleral pallor or icterus noted. Oral mucosa is moist.  Chest:  Diminished breath sounds bilaterally. No crackles or wheezes.  CVS: S1 &S2 heard. No murmur.  Regular rate and rhythm. Abdomen: Soft, nontender, nondistended.  Bowel sounds are heard.   Extremities: No cyanosis, clubbing or edema.  Peripheral pulses are palpable. Psych: Alert, awake and oriented, normal mood CNS:  No cranial nerve deficits.  Moves all extremities. Skin: Warm and dry.  No rashes noted.  Data Reviewed:   CBC: Recent Labs  Lab 11/05/21 2043 11/05/21 2052 11/07/21 0157 11/07/21 2058 11/08/21 0226 11/09/21 0112 11/11/21 0143  WBC 12.3*   < > 13.4* 13.1* 11.0* 11.0* 11.4*  NEUTROABS 8.1*  --   --  9.6*  --   --   --   HGB 10.5*   < > 10.4* 10.2* 9.3* 9.0* 9.7*  HCT 36.9*   < > 35.5* 34.3* 32.3* 29.2* 32.3*  MCV 101.1*   < > 98.9 99.4 100.0  94.5 93.9  PLT 395   < > 392 367 354 333 357   < > = values in this interval not displayed.     Basic Metabolic Panel: Recent Labs  Lab 11/06/21 0001 11/06/21 0241 11/07/21 0157 11/08/21 0226 11/09/21 0112 11/11/21 0143  NA 142 141 142 145 140 141  K 5.4* 5.0 4.3 5.1 4.3 4.2  CL 106  --  103 104 98 98  CO2 30  --  33* 37* 35* 35*  GLUCOSE 143*  --  163* 88 94 129*  BUN 17  --  16 24* 30* 44*  CREATININE 1.16  --  1.05 1.07 1.22 1.40*  CALCIUM 8.5*  --  8.7* 8.9 8.6* 8.9  MG  --   --  2.4 2.0  --   --      Liver Function Tests: Recent Labs  Lab 11/05/21 2043  AST 17  ALT 22  ALKPHOS  70  BILITOT 0.3  PROT 7.8  ALBUMIN 3.3*     Radiology Studies: No results found.    LOS: 6 days    Joycelyn Das, MD [facility] Available via Epic secure chat 7am-7pm After these hours, please refer to coverage provider listed on [email or web address] 11/11/2021, 11:33 AM

## 2021-11-11 NOTE — Progress Notes (Signed)
ANTICOAGULATION CONSULT NOTE  Pharmacy Consult for Warfarin Indication:  hx VTE and Antiphospholipid antibody syndrome  Allergies  Allergen Reactions   Melatonin Anxiety   Altace [Ramipril] Cough    Patient Measurements: Height: 5\' 8"  (172.7 cm) Weight: 108.9 kg (240 lb) IBW/kg (Calculated) : 68.4  Vital Signs: Temp: 98 F (36.7 C) (08/29 0757) Temp Source: Oral (08/29 0757) BP: 116/67 (08/29 0757) Pulse Rate: 70 (08/29 0757)  Labs: Recent Labs    11/09/21 0112 11/10/21 0616 11/11/21 0143  HGB 9.0*  --  9.7*  HCT 29.2*  --  32.3*  PLT 333  --  357  LABPROT 24.9* 23.2* 22.3*  INR 2.3* 2.1* 2.0*  CREATININE 1.22  --  1.40*     Estimated Creatinine Clearance: 62.1 mL/min (A) (by C-G formula based on SCr of 1.4 mg/dL (H)).   Assessment: 66 y.o. M with medical history significant for prior PE and antiphospholipid antibody syndrome on Warfarin PTA who presented with AMS and respiratory distress. Pharmacy consulted to manage warfarin.    PTA warfarin regimen: 5 mg all days except 6 mg Monday and Friday  (confirmed with patient, has 5 and 1 mg tablets at home)  INR therapeutic at 2. Hgb low but stable. Plts WNL. Drug-Drug Interactions: Rosuvastatin + warfarin and torsemide + warfarin: may lead to elevated INR (patient has been maintained on this regimen PTA) Doxycycline can increase risk of bleeding - antibiotic to be completed today  Goal of Therapy:  INR 2-3 Monitor platelets by anticoagulation protocol: Yes   Plan:  Give warfarin 5mg  PO x1 tonight Check INR daily while on warfarin Continue to monitor H&H and platelets   Thank you for allowing pharmacy to be a part of this patient's care.  Saturday, PharmD, BCPS 11/11/2021 9:22 AM

## 2021-11-11 NOTE — Plan of Care (Addendum)
Called facility [facility] LPN to give report.  Problem: Education: Goal: Knowledge of disease or condition will improve Outcome: Adequate for Discharge Goal: Knowledge of the prescribed therapeutic regimen will improve Outcome: Adequate for Discharge Goal: Individualized Educational Video(s) Outcome: Adequate for Discharge   Problem: Activity: Goal: Ability to tolerate increased activity will improve Outcome: Adequate for Discharge Goal: Will verbalize the importance of balancing activity with adequate rest periods Outcome: Adequate for Discharge   Problem: Respiratory: Goal: Ability to maintain a clear airway will improve Outcome: Adequate for Discharge Goal: Levels of oxygenation will improve Outcome: Adequate for Discharge Goal: Ability to maintain adequate ventilation will improve Outcome: Adequate for Discharge   Problem: Education: Goal: Ability to describe self-care measures that may prevent or decrease complications (Diabetes Survival Skills Education) will improve Outcome: Adequate for Discharge Goal: Individualized Educational Video(s) Outcome: Adequate for Discharge   Problem: Coping: Goal: Ability to adjust to condition or change in health will improve Outcome: Adequate for Discharge   Problem: Fluid Volume: Goal: Ability to maintain a balanced intake and output will improve Outcome: Adequate for Discharge   Problem: Health Behavior/Discharge Planning: Goal: Ability to identify and utilize available resources and services will improve Outcome: Adequate for Discharge Goal: Ability to manage health-related needs will improve Outcome: Adequate for Discharge   Problem: Metabolic: Goal: Ability to maintain appropriate glucose levels will improve Outcome: Adequate for Discharge   Problem: Nutritional: Goal: Maintenance of adequate nutrition will improve Outcome: Adequate for Discharge Goal: Progress toward achieving an optimal weight will improve Outcome:  Adequate for Discharge   Problem: Skin Integrity: Goal: Risk for impaired skin integrity will decrease Outcome: Adequate for Discharge   Problem: Tissue Perfusion: Goal: Adequacy of tissue perfusion will improve Outcome: Adequate for Discharge   Problem: Education: Goal: Knowledge of General Education information will improve Description: Including pain rating scale, medication(s)/side effects and non-pharmacologic comfort measures Outcome: Adequate for Discharge   Problem: Health Behavior/Discharge Planning: Goal: Ability to manage health-related needs will improve Outcome: Adequate for Discharge   Problem: Clinical Measurements: Goal: Ability to maintain clinical measurements within normal limits will improve Outcome: Adequate for Discharge Goal: Will remain free from infection Outcome: Adequate for Discharge Goal: Diagnostic test results will improve Outcome: Adequate for Discharge Goal: Respiratory complications will improve Outcome: Adequate for Discharge Goal: Cardiovascular complication will be avoided Outcome: Adequate for Discharge   Problem: Activity: Goal: Risk for activity intolerance will decrease Outcome: Adequate for Discharge   Problem: Nutrition: Goal: Adequate nutrition will be maintained Outcome: Adequate for Discharge   Problem: Coping: Goal: Level of anxiety will decrease Outcome: Adequate for Discharge   Problem: Elimination: Goal: Will not experience complications related to bowel motility Outcome: Adequate for Discharge Goal: Will not experience complications related to urinary retention Outcome: Adequate for Discharge   Problem: Pain Managment: Goal: General experience of comfort will improve Outcome: Adequate for Discharge   Problem: Safety: Goal: Ability to remain free from injury will improve Outcome: Adequate for Discharge   Problem: Skin Integrity: Goal: Risk for impaired skin integrity will decrease Outcome: Adequate for  Discharge

## 2021-11-12 DIAGNOSIS — J9622 Acute and chronic respiratory failure with hypercapnia: Secondary | ICD-10-CM | POA: Diagnosis not present

## 2021-11-12 DIAGNOSIS — I25810 Atherosclerosis of coronary artery bypass graft(s) without angina pectoris: Secondary | ICD-10-CM | POA: Diagnosis not present

## 2021-11-12 DIAGNOSIS — D6861 Antiphospholipid syndrome: Secondary | ICD-10-CM | POA: Diagnosis not present

## 2021-11-12 DIAGNOSIS — N401 Enlarged prostate with lower urinary tract symptoms: Secondary | ICD-10-CM | POA: Diagnosis not present

## 2021-11-13 DIAGNOSIS — I25810 Atherosclerosis of coronary artery bypass graft(s) without angina pectoris: Secondary | ICD-10-CM | POA: Diagnosis not present

## 2021-11-13 DIAGNOSIS — D6861 Antiphospholipid syndrome: Secondary | ICD-10-CM | POA: Diagnosis not present

## 2021-11-13 DIAGNOSIS — J9622 Acute and chronic respiratory failure with hypercapnia: Secondary | ICD-10-CM | POA: Diagnosis not present

## 2021-11-13 DIAGNOSIS — N401 Enlarged prostate with lower urinary tract symptoms: Secondary | ICD-10-CM | POA: Diagnosis not present

## 2021-11-14 DIAGNOSIS — I25810 Atherosclerosis of coronary artery bypass graft(s) without angina pectoris: Secondary | ICD-10-CM | POA: Diagnosis not present

## 2021-11-14 DIAGNOSIS — J9622 Acute and chronic respiratory failure with hypercapnia: Secondary | ICD-10-CM | POA: Diagnosis not present

## 2021-11-14 DIAGNOSIS — N401 Enlarged prostate with lower urinary tract symptoms: Secondary | ICD-10-CM | POA: Diagnosis not present

## 2021-11-14 DIAGNOSIS — D6861 Antiphospholipid syndrome: Secondary | ICD-10-CM | POA: Diagnosis not present

## 2021-11-19 DIAGNOSIS — J9622 Acute and chronic respiratory failure with hypercapnia: Secondary | ICD-10-CM | POA: Diagnosis not present

## 2021-11-19 DIAGNOSIS — N401 Enlarged prostate with lower urinary tract symptoms: Secondary | ICD-10-CM | POA: Diagnosis not present

## 2021-11-19 DIAGNOSIS — I25810 Atherosclerosis of coronary artery bypass graft(s) without angina pectoris: Secondary | ICD-10-CM | POA: Diagnosis not present

## 2021-11-19 DIAGNOSIS — D6861 Antiphospholipid syndrome: Secondary | ICD-10-CM | POA: Diagnosis not present

## 2021-11-21 DIAGNOSIS — Z9981 Dependence on supplemental oxygen: Secondary | ICD-10-CM | POA: Diagnosis not present

## 2021-11-21 DIAGNOSIS — I6523 Occlusion and stenosis of bilateral carotid arteries: Secondary | ICD-10-CM | POA: Diagnosis not present

## 2021-11-21 DIAGNOSIS — G4733 Obstructive sleep apnea (adult) (pediatric): Secondary | ICD-10-CM | POA: Diagnosis not present

## 2021-11-21 DIAGNOSIS — F17210 Nicotine dependence, cigarettes, uncomplicated: Secondary | ICD-10-CM | POA: Diagnosis not present

## 2021-11-21 DIAGNOSIS — J441 Chronic obstructive pulmonary disease with (acute) exacerbation: Secondary | ICD-10-CM | POA: Diagnosis not present

## 2021-11-21 DIAGNOSIS — J9621 Acute and chronic respiratory failure with hypoxia: Secondary | ICD-10-CM | POA: Diagnosis not present

## 2021-11-21 DIAGNOSIS — E1169 Type 2 diabetes mellitus with other specified complication: Secondary | ICD-10-CM | POA: Diagnosis not present

## 2021-11-21 DIAGNOSIS — E785 Hyperlipidemia, unspecified: Secondary | ICD-10-CM | POA: Diagnosis not present

## 2021-11-21 DIAGNOSIS — Z9181 History of falling: Secondary | ICD-10-CM | POA: Diagnosis not present

## 2021-11-21 DIAGNOSIS — J9622 Acute and chronic respiratory failure with hypercapnia: Secondary | ICD-10-CM | POA: Diagnosis not present

## 2021-11-21 DIAGNOSIS — I10 Essential (primary) hypertension: Secondary | ICD-10-CM | POA: Diagnosis not present

## 2021-11-21 DIAGNOSIS — I252 Old myocardial infarction: Secondary | ICD-10-CM | POA: Diagnosis not present

## 2021-11-21 DIAGNOSIS — E1151 Type 2 diabetes mellitus with diabetic peripheral angiopathy without gangrene: Secondary | ICD-10-CM | POA: Diagnosis not present

## 2021-11-21 DIAGNOSIS — I2510 Atherosclerotic heart disease of native coronary artery without angina pectoris: Secondary | ICD-10-CM | POA: Diagnosis not present

## 2021-11-21 DIAGNOSIS — I350 Nonrheumatic aortic (valve) stenosis: Secondary | ICD-10-CM | POA: Diagnosis not present

## 2021-11-21 DIAGNOSIS — D6861 Antiphospholipid syndrome: Secondary | ICD-10-CM | POA: Diagnosis not present

## 2021-11-24 DIAGNOSIS — J9610 Chronic respiratory failure, unspecified whether with hypoxia or hypercapnia: Secondary | ICD-10-CM | POA: Diagnosis not present

## 2021-11-24 DIAGNOSIS — J449 Chronic obstructive pulmonary disease, unspecified: Secondary | ICD-10-CM | POA: Diagnosis not present

## 2021-11-24 DIAGNOSIS — R55 Syncope and collapse: Secondary | ICD-10-CM | POA: Diagnosis not present

## 2021-12-08 DIAGNOSIS — Z7901 Long term (current) use of anticoagulants: Secondary | ICD-10-CM | POA: Diagnosis not present

## 2021-12-08 DIAGNOSIS — E1169 Type 2 diabetes mellitus with other specified complication: Secondary | ICD-10-CM | POA: Diagnosis not present

## 2021-12-08 DIAGNOSIS — Z7984 Long term (current) use of oral hypoglycemic drugs: Secondary | ICD-10-CM | POA: Diagnosis not present

## 2021-12-08 DIAGNOSIS — J440 Chronic obstructive pulmonary disease with acute lower respiratory infection: Secondary | ICD-10-CM | POA: Diagnosis not present

## 2021-12-11 DIAGNOSIS — I10 Essential (primary) hypertension: Secondary | ICD-10-CM | POA: Diagnosis not present

## 2021-12-11 DIAGNOSIS — E1151 Type 2 diabetes mellitus with diabetic peripheral angiopathy without gangrene: Secondary | ICD-10-CM | POA: Diagnosis not present

## 2021-12-11 DIAGNOSIS — I2510 Atherosclerotic heart disease of native coronary artery without angina pectoris: Secondary | ICD-10-CM | POA: Diagnosis not present

## 2021-12-11 DIAGNOSIS — J449 Chronic obstructive pulmonary disease, unspecified: Secondary | ICD-10-CM | POA: Diagnosis not present

## 2021-12-11 DIAGNOSIS — E7800 Pure hypercholesterolemia, unspecified: Secondary | ICD-10-CM | POA: Diagnosis not present

## 2021-12-11 DIAGNOSIS — Z5181 Encounter for therapeutic drug level monitoring: Secondary | ICD-10-CM | POA: Diagnosis not present

## 2021-12-11 DIAGNOSIS — G4730 Sleep apnea, unspecified: Secondary | ICD-10-CM | POA: Diagnosis not present

## 2021-12-11 DIAGNOSIS — R55 Syncope and collapse: Secondary | ICD-10-CM | POA: Diagnosis not present

## 2021-12-11 DIAGNOSIS — Z79899 Other long term (current) drug therapy: Secondary | ICD-10-CM | POA: Diagnosis not present

## 2021-12-18 DIAGNOSIS — I10 Essential (primary) hypertension: Secondary | ICD-10-CM | POA: Diagnosis not present

## 2021-12-18 DIAGNOSIS — R413 Other amnesia: Secondary | ICD-10-CM | POA: Insufficient documentation

## 2021-12-18 DIAGNOSIS — D6861 Antiphospholipid syndrome: Secondary | ICD-10-CM | POA: Diagnosis not present

## 2021-12-18 DIAGNOSIS — Z7901 Long term (current) use of anticoagulants: Secondary | ICD-10-CM | POA: Diagnosis not present

## 2021-12-18 DIAGNOSIS — E7800 Pure hypercholesterolemia, unspecified: Secondary | ICD-10-CM | POA: Diagnosis not present

## 2021-12-18 DIAGNOSIS — Z125 Encounter for screening for malignant neoplasm of prostate: Secondary | ICD-10-CM | POA: Diagnosis not present

## 2021-12-18 DIAGNOSIS — E1140 Type 2 diabetes mellitus with diabetic neuropathy, unspecified: Secondary | ICD-10-CM | POA: Diagnosis not present

## 2021-12-21 DIAGNOSIS — E1151 Type 2 diabetes mellitus with diabetic peripheral angiopathy without gangrene: Secondary | ICD-10-CM | POA: Diagnosis not present

## 2021-12-21 DIAGNOSIS — I6523 Occlusion and stenosis of bilateral carotid arteries: Secondary | ICD-10-CM | POA: Diagnosis not present

## 2021-12-21 DIAGNOSIS — E785 Hyperlipidemia, unspecified: Secondary | ICD-10-CM | POA: Diagnosis not present

## 2021-12-21 DIAGNOSIS — G4733 Obstructive sleep apnea (adult) (pediatric): Secondary | ICD-10-CM | POA: Diagnosis not present

## 2021-12-21 DIAGNOSIS — F17210 Nicotine dependence, cigarettes, uncomplicated: Secondary | ICD-10-CM | POA: Diagnosis not present

## 2021-12-21 DIAGNOSIS — Z9181 History of falling: Secondary | ICD-10-CM | POA: Diagnosis not present

## 2021-12-21 DIAGNOSIS — J9621 Acute and chronic respiratory failure with hypoxia: Secondary | ICD-10-CM | POA: Diagnosis not present

## 2021-12-21 DIAGNOSIS — J9622 Acute and chronic respiratory failure with hypercapnia: Secondary | ICD-10-CM | POA: Diagnosis not present

## 2021-12-21 DIAGNOSIS — E1169 Type 2 diabetes mellitus with other specified complication: Secondary | ICD-10-CM | POA: Diagnosis not present

## 2021-12-21 DIAGNOSIS — I252 Old myocardial infarction: Secondary | ICD-10-CM | POA: Diagnosis not present

## 2021-12-21 DIAGNOSIS — J441 Chronic obstructive pulmonary disease with (acute) exacerbation: Secondary | ICD-10-CM | POA: Diagnosis not present

## 2021-12-21 DIAGNOSIS — I10 Essential (primary) hypertension: Secondary | ICD-10-CM | POA: Diagnosis not present

## 2021-12-21 DIAGNOSIS — D6861 Antiphospholipid syndrome: Secondary | ICD-10-CM | POA: Diagnosis not present

## 2021-12-21 DIAGNOSIS — I2510 Atherosclerotic heart disease of native coronary artery without angina pectoris: Secondary | ICD-10-CM | POA: Diagnosis not present

## 2021-12-21 DIAGNOSIS — I350 Nonrheumatic aortic (valve) stenosis: Secondary | ICD-10-CM | POA: Diagnosis not present

## 2021-12-21 DIAGNOSIS — Z9981 Dependence on supplemental oxygen: Secondary | ICD-10-CM | POA: Diagnosis not present

## 2021-12-24 DIAGNOSIS — J9610 Chronic respiratory failure, unspecified whether with hypoxia or hypercapnia: Secondary | ICD-10-CM | POA: Diagnosis not present

## 2021-12-24 DIAGNOSIS — R55 Syncope and collapse: Secondary | ICD-10-CM | POA: Diagnosis not present

## 2021-12-24 DIAGNOSIS — J449 Chronic obstructive pulmonary disease, unspecified: Secondary | ICD-10-CM | POA: Diagnosis not present

## 2022-01-01 DIAGNOSIS — R55 Syncope and collapse: Secondary | ICD-10-CM | POA: Diagnosis not present

## 2022-01-06 ENCOUNTER — Ambulatory Visit: Payer: Medicare Other | Admitting: Podiatrist

## 2022-01-12 ENCOUNTER — Encounter (INDEPENDENT_AMBULATORY_CARE_PROVIDER_SITE_OTHER): Payer: Self-pay

## 2022-01-21 ENCOUNTER — Emergency Department: Payer: Medicare Other

## 2022-01-21 ENCOUNTER — Ambulatory Visit
Admission: EM | Admit: 2022-01-21 | Discharge: 2022-01-21 | Disposition: A | Discharge status: Home / Self Care | Payer: Medicare Other

## 2022-01-21 ENCOUNTER — Encounter: Payer: Self-pay | Admitting: Emergency Medicine

## 2022-01-21 ENCOUNTER — Other Ambulatory Visit: Payer: Self-pay

## 2022-01-21 DIAGNOSIS — I10 Essential (primary) hypertension: Secondary | ICD-10-CM | POA: Diagnosis not present

## 2022-01-21 DIAGNOSIS — I350 Nonrheumatic aortic (valve) stenosis: Secondary | ICD-10-CM | POA: Diagnosis not present

## 2022-01-21 DIAGNOSIS — Z8249 Family history of ischemic heart disease and other diseases of the circulatory system: Secondary | ICD-10-CM

## 2022-01-21 DIAGNOSIS — I2510 Atherosclerotic heart disease of native coronary artery without angina pectoris: Secondary | ICD-10-CM | POA: Diagnosis present

## 2022-01-21 DIAGNOSIS — Z955 Presence of coronary angioplasty implant and graft: Secondary | ICD-10-CM

## 2022-01-21 DIAGNOSIS — E785 Hyperlipidemia, unspecified: Secondary | ICD-10-CM | POA: Diagnosis not present

## 2022-01-21 DIAGNOSIS — E1169 Type 2 diabetes mellitus with other specified complication: Secondary | ICD-10-CM | POA: Diagnosis present

## 2022-01-21 DIAGNOSIS — G4733 Obstructive sleep apnea (adult) (pediatric): Secondary | ICD-10-CM | POA: Diagnosis present

## 2022-01-21 DIAGNOSIS — R296 Repeated falls: Secondary | ICD-10-CM | POA: Diagnosis present

## 2022-01-21 DIAGNOSIS — Z743 Need for continuous supervision: Secondary | ICD-10-CM | POA: Diagnosis not present

## 2022-01-21 DIAGNOSIS — D62 Acute posthemorrhagic anemia: Secondary | ICD-10-CM | POA: Diagnosis present

## 2022-01-21 DIAGNOSIS — Z9981 Dependence on supplemental oxygen: Secondary | ICD-10-CM | POA: Diagnosis not present

## 2022-01-21 DIAGNOSIS — Z91199 Patient's noncompliance with other medical treatment and regimen due to unspecified reason: Secondary | ICD-10-CM

## 2022-01-21 DIAGNOSIS — Z7982 Long term (current) use of aspirin: Secondary | ICD-10-CM

## 2022-01-21 DIAGNOSIS — K760 Fatty (change of) liver, not elsewhere classified: Secondary | ICD-10-CM | POA: Diagnosis not present

## 2022-01-21 DIAGNOSIS — Z888 Allergy status to other drugs, medicaments and biological substances status: Secondary | ICD-10-CM

## 2022-01-21 DIAGNOSIS — S300XXA Contusion of lower back and pelvis, initial encounter: Secondary | ICD-10-CM | POA: Diagnosis present

## 2022-01-21 DIAGNOSIS — N400 Enlarged prostate without lower urinary tract symptoms: Secondary | ICD-10-CM | POA: Diagnosis present

## 2022-01-21 DIAGNOSIS — D6861 Antiphospholipid syndrome: Secondary | ICD-10-CM | POA: Diagnosis not present

## 2022-01-21 DIAGNOSIS — Z951 Presence of aortocoronary bypass graft: Secondary | ICD-10-CM

## 2022-01-21 DIAGNOSIS — J9811 Atelectasis: Secondary | ICD-10-CM | POA: Diagnosis not present

## 2022-01-21 DIAGNOSIS — I4510 Unspecified right bundle-branch block: Secondary | ICD-10-CM | POA: Diagnosis present

## 2022-01-21 DIAGNOSIS — I110 Hypertensive heart disease with heart failure: Secondary | ICD-10-CM | POA: Diagnosis present

## 2022-01-21 DIAGNOSIS — Z7901 Long term (current) use of anticoagulants: Secondary | ICD-10-CM

## 2022-01-21 DIAGNOSIS — D6832 Hemorrhagic disorder due to extrinsic circulating anticoagulants: Principal | ICD-10-CM | POA: Diagnosis present

## 2022-01-21 DIAGNOSIS — R2232 Localized swelling, mass and lump, left upper limb: Secondary | ICD-10-CM | POA: Diagnosis not present

## 2022-01-21 DIAGNOSIS — E669 Obesity, unspecified: Secondary | ICD-10-CM | POA: Diagnosis present

## 2022-01-21 DIAGNOSIS — W19XXXA Unspecified fall, initial encounter: Secondary | ICD-10-CM

## 2022-01-21 DIAGNOSIS — T45515A Adverse effect of anticoagulants, initial encounter: Secondary | ICD-10-CM | POA: Diagnosis present

## 2022-01-21 DIAGNOSIS — F17210 Nicotine dependence, cigarettes, uncomplicated: Secondary | ICD-10-CM | POA: Diagnosis present

## 2022-01-21 DIAGNOSIS — Z7984 Long term (current) use of oral hypoglycemic drugs: Secondary | ICD-10-CM

## 2022-01-21 DIAGNOSIS — I5032 Chronic diastolic (congestive) heart failure: Secondary | ICD-10-CM | POA: Diagnosis not present

## 2022-01-21 DIAGNOSIS — E1151 Type 2 diabetes mellitus with diabetic peripheral angiopathy without gangrene: Secondary | ICD-10-CM | POA: Diagnosis present

## 2022-01-21 DIAGNOSIS — J449 Chronic obstructive pulmonary disease, unspecified: Secondary | ICD-10-CM | POA: Diagnosis not present

## 2022-01-21 DIAGNOSIS — R0602 Shortness of breath: Secondary | ICD-10-CM | POA: Diagnosis not present

## 2022-01-21 DIAGNOSIS — J9610 Chronic respiratory failure, unspecified whether with hypoxia or hypercapnia: Secondary | ICD-10-CM | POA: Diagnosis not present

## 2022-01-21 DIAGNOSIS — T148XXA Other injury of unspecified body region, initial encounter: Secondary | ICD-10-CM

## 2022-01-21 DIAGNOSIS — Z7951 Long term (current) use of inhaled steroids: Secondary | ICD-10-CM

## 2022-01-21 DIAGNOSIS — K219 Gastro-esophageal reflux disease without esophagitis: Secondary | ICD-10-CM | POA: Diagnosis present

## 2022-01-21 DIAGNOSIS — J9611 Chronic respiratory failure with hypoxia: Secondary | ICD-10-CM | POA: Diagnosis present

## 2022-01-21 DIAGNOSIS — D649 Anemia, unspecified: Secondary | ICD-10-CM | POA: Diagnosis not present

## 2022-01-21 DIAGNOSIS — R509 Fever, unspecified: Secondary | ICD-10-CM | POA: Diagnosis not present

## 2022-01-21 DIAGNOSIS — G450 Vertebro-basilar artery syndrome: Secondary | ICD-10-CM | POA: Diagnosis present

## 2022-01-21 DIAGNOSIS — Z86711 Personal history of pulmonary embolism: Secondary | ICD-10-CM

## 2022-01-21 DIAGNOSIS — R55 Syncope and collapse: Secondary | ICD-10-CM | POA: Diagnosis not present

## 2022-01-21 DIAGNOSIS — Z6837 Body mass index (BMI) 37.0-37.9, adult: Secondary | ICD-10-CM | POA: Diagnosis not present

## 2022-01-21 DIAGNOSIS — N200 Calculus of kidney: Secondary | ICD-10-CM | POA: Diagnosis not present

## 2022-01-21 DIAGNOSIS — I959 Hypotension, unspecified: Secondary | ICD-10-CM | POA: Diagnosis not present

## 2022-01-21 DIAGNOSIS — M47816 Spondylosis without myelopathy or radiculopathy, lumbar region: Secondary | ICD-10-CM | POA: Diagnosis not present

## 2022-01-21 DIAGNOSIS — Z79899 Other long term (current) drug therapy: Secondary | ICD-10-CM

## 2022-01-21 LAB — CBC
HCT: 25.7 % — ABNORMAL LOW (ref 39.0–52.0)
Hemoglobin: 7.8 g/dL — ABNORMAL LOW (ref 13.0–17.0)
MCH: 28.6 pg (ref 26.0–34.0)
MCHC: 30.4 g/dL (ref 30.0–36.0)
MCV: 94.1 fL (ref 80.0–100.0)
Platelets: 232 10*3/uL (ref 150–400)
RBC: 2.73 MIL/uL — ABNORMAL LOW (ref 4.22–5.81)
RDW: 15.9 % — ABNORMAL HIGH (ref 11.5–15.5)
WBC: 12 10*3/uL — ABNORMAL HIGH (ref 4.0–10.5)
nRBC: 0 % (ref 0.0–0.2)

## 2022-01-21 LAB — BASIC METABOLIC PANEL
Anion gap: 8 (ref 5–15)
BUN: 27 mg/dL — ABNORMAL HIGH (ref 8–23)
CO2: 38 mmol/L — ABNORMAL HIGH (ref 22–32)
Calcium: 8.8 mg/dL — ABNORMAL LOW (ref 8.9–10.3)
Chloride: 96 mmol/L — ABNORMAL LOW (ref 98–111)
Creatinine, Ser: 0.97 mg/dL (ref 0.61–1.24)
GFR, Estimated: >60 mL/min (ref >60)
Glucose, Bld: 82 mg/dL (ref 70–99)
Potassium: 3.5 mmol/L (ref 3.5–5.1)
Sodium: 142 mmol/L (ref 135–145)

## 2022-01-21 LAB — PROTIME-INR
INR: 2.2 — ABNORMAL HIGH (ref 0.8–1.2)
Prothrombin Time: 24.2 seconds — ABNORMAL HIGH (ref 11.4–15.2)

## 2022-01-21 LAB — BRAIN NATRIURETIC PEPTIDE: B Natriuretic Peptide: 87.4 pg/mL (ref 0.0–100.0)

## 2022-01-21 LAB — TROPONIN I (HIGH SENSITIVITY): Troponin I (High Sensitivity): 11 ng/L (ref <18)

## 2022-01-21 NOTE — ED Provider Notes (Signed)
[name]    CSN: [id number] Arrival date & time: 01/21/22  1820      History   Chief Complaint Chief Complaint  Patient presents with   Fall   Shortness of Breath    HPI Jose [name] is a 66 y.o. male.   Patient presents for further evaluation after a fall that occurred about 2 days ago.  Patient reports that he has been has multiple syncopal episodes frequently which is normal for him.  This has been occurring for multiple years.  He had one of the syncopal episodes 2 days ago that caused the fall.  He states that he typically gets dizzy before the syncopal episodes.  Patient states that he fell landing on his right butt cheek.  He denies that he hit his head but did lose consciousness.  He does take Coumadin and states that he has a very large bruise on the right butt cheek that is causing significant pain.  Patient also reporting that he is having shortness of breath but states that he left his home without his portable oxygen machine which is the reason for this.  He states that he has COPD and wears 4 L of oxygen at all times.   Fall  Shortness of Breath   Past Medical History:  Diagnosis Date   Antiphospholipid antibody syndrome (HCC)    CAD S/P percutaneous coronary angioplasty '98, '04, Feb 2017   [facility]   Carotid arterial disease ([place]) 10/2015   moderate bilateral   COPD (chronic obstructive pulmonary disease) (HCC)    Coronary artery disease    Hypertension    Non-insulin treated type 2 diabetes mellitus ([place])    Pulmonary emboli ([place]) 2011   RBBB    Sleep apnea     Patient Active Problem List   Diagnosis Date Noted   COPD with acute exacerbation ([place]) 11/06/2021   Acute on chronic respiratory failure with hypercapnia ([place]) 11/05/2021   AKI (acute kidney injury) ([place]) 11/05/2021   Obesity (BMI 30-39.9)    Benign prostatic hyperplasia without lower urinary tract symptoms    COPD exacerbation ([place]) 07/13/2020   Prolonged QT interval  07/13/2020   Acute on chronic respiratory failure with hypoxia (HCC) 07/13/2020   Chronic diastolic CHF (congestive heart failure) ([place]) 07/13/2020   Community acquired pneumonia 01/18/2020   Coronary atherosclerosis 10/04/2017   GERD (gastroesophageal reflux disease) 10/04/2017   Essential hypertension 10/04/2017   Transient visual loss 10/04/2017   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   Nonrheumatic aortic valve stenosis 03/31/2017   Pericarditis, Acute 02/12/2017   Acute on chronic right heart failure ([place]) 02/11/2017   Angina at rest 08/17/2016   Unstable angina ([place]) 08/16/2016   Tobacco abuse 08/16/2016   Antiphospholipid syndrome ([place]) 08/16/2016   Hyperlipidemia 08/16/2016   Essential hypertension, benign 08/16/2016   COPD (chronic obstructive pulmonary disease) ([place]) 08/16/2016   Sleep apnea 08/16/2016   CAD S/P multiple PCIs 08/16/2016   History of pulmonary embolus (PE)-2011 08/16/2016   Chronic anticoagulation-Couamdin 08/16/2016   Type 2 diabetes mellitus with hyperlipidemia ([place]) 08/16/2016   Carotid artery disease ([place]) 08/16/2016   RBBB (right bundle branch block) 08/16/2016   Dyspnea on exertion 10/17/2015   ED (erectile dysfunction) 08/09/2012    Past Surgical History:  Procedure Laterality Date   APPENDECTOMY     APPLICATION OF WOUND VAC Left 03/18/2019   Procedure: Application Of Wound Vac;  Surgeon: Newt Minion, MD;  Location: [place];  Service: Orthopedics;  Laterality: Left;  BACK SURGERY  '89, '06   COLONOSCOPY     CORONARY ANGIOPLASTY WITH STENT PLACEMENT  '98, '04, Feb 2017   I & D EXTREMITY Left 03/15/2019   Procedure: DEBRIDEMENT LEFT THIGH;  Surgeon: Newt Minion, MD;  Location: [place];  Service: Orthopedics;  Laterality: Left;   INCISION AND DRAINAGE OF WOUND Left 03/18/2019   Procedure: LEFT THIGH DEBRIDEMENT;  Surgeon: Newt Minion, MD;  Location: [place];  Service: Orthopedics;  Laterality: Left;       Home Medications    Prior to Admission  medications   Medication Sig Start Date End Date Taking? Authorizing Provider  acetaminophen (TYLENOL) 650 MG CR tablet Take 1,950 mg by mouth every 8 (eight) hours as needed for pain.    [provider]  albuterol (PROVENTIL) (5 MG/ML) 0.5% nebulizer solution Take 0.5 mLs (2.5 mg total) by nebulization every 6 (six) hours as needed for wheezing or shortness of breath. 12/27/18   Wyvonnia Dusky, MD  albuterol (VENTOLIN HFA) 108 (90 Base) MCG/ACT inhaler Inhale 2 puffs into the lungs every 6 (six) hours as needed for wheezing or shortness of breath.    [provider]  aspirin EC 81 MG EC tablet Take 1 tablet (81 mg total) by mouth daily. Swallow whole. 01/23/20   Dagar, Meredith Staggers, MD  DULoxetine (CYMBALTA) 30 MG capsule Take 30 mg by mouth daily. Take one capsule (30 mg) daily along with 60 mg capsule to equal 90 mg daily 11/02/21   [provider]  DULoxetine (CYMBALTA) 60 MG capsule Take 60 mg by mouth daily. Take one capsule (60 mg) along with 30 mg capsule to equal 90 mg daily    [provider]  Fluticasone-Umeclidin-Vilant (TRELEGY ELLIPTA) 200-62.5-25 MCG/ACT AEPB Inhale 1 puff into the lungs daily.    [provider]  gabapentin (NEURONTIN) 600 MG tablet Take 600 mg by mouth 3 (three) times daily.    [provider]  glimepiride (AMARYL) 4 MG tablet Take 4 mg by mouth 2 (two) times daily. 12/04/20   [provider]  ipratropium-albuterol (DUONEB) 0.5-2.5 (3) MG/3ML SOLN Take 3 mLs by nebulization every 4 (four) hours as needed (For shortness of breath or wheezing).    [provider]  metoprolol tartrate (LOPRESSOR) 12.5 mg TABS tablet Take 12.5 mg by mouth 2 (two) times daily.    [provider]  nitroGLYCERIN (NITROSTAT) 0.4 MG SL tablet Place 0.4 mg under the tongue every 5 (five) minutes as needed for chest pain.  07/07/16   [provider]  OXYGEN Inhale 4-6 L into the lungs daily.    [provider]  pantoprazole (PROTONIX) 40 MG tablet Take 40 mg by mouth 2 (two) times daily.    [provider]  rosuvastatin (CRESTOR) 10 MG tablet Take 10 mg by mouth every evening. 07/07/16   [provider]  tamsulosin (FLOMAX) 0.4 MG CAPS capsule Take 0.8 mg by mouth daily.    [provider]  tiZANidine (ZANAFLEX) 4 MG tablet Take 4 mg by mouth 3 (three) times daily as needed for muscle spasms. 05/29/21   [provider]  topiramate (TOPAMAX) 50 MG tablet Take 75 mg by mouth 2 (two) times daily.    [provider]  torsemide (DEMADEX) 20 MG tablet Take 20 mg by mouth daily.    [provider]  warfarin (COUMADIN) 6 MG tablet Take 5 mg by mouth See admin instructions. Take one tablet (5 mg) Tuesday, Wednesday,  Thursday, Saturday and Sunday Then take (6 mg) Monday and Friday    [provider]    Family History Family History  Problem Relation Age of Onset   CAD Father 29    Social History Social History   Tobacco Use   Smoking status: Former    Packs/day: 0.50    Years: 40.00    Total pack years: 20.00    Types: Cigarettes    Quit date: 01/15/2019    Years since quitting: 3.0   Smokeless tobacco: Never   Tobacco comments:    has started smoking again, willing to quit.  Vaping Use   Vaping Use: Never used  Substance Use Topics   Alcohol use: No   Drug use: No     Allergies   Melatonin and Altace [ramipril]   Review of Systems Review of Systems Per HPI  Physical Exam Triage Vital Signs ED Triage Vitals  Enc Vitals Group     BP 01/21/22 1834 (!) 111/57     Pulse Rate 01/21/22 1827 87     Resp --      Temp 01/21/22 1834 100.3 F (37.9 C)     Temp src --      SpO2 01/21/22 1827 98 %     Weight --      Height --      Head Circumference --      Peak Flow --      Pain Score --      Pain Loc --      Pain Edu? --      Excl. in [place]? --    No data found.  Updated Vital Signs BP (!) 111/57    Pulse 87   Temp 100.3 F (37.9 C)   SpO2 98%   Visual Acuity Right Eye Distance:   Left Eye Distance:   Bilateral Distance:    Right Eye Near:   Left Eye Near:    Bilateral Near:     Physical Exam Constitutional:      General: He is not in acute distress.    Appearance: Normal appearance. He is not toxic-appearing or diaphoretic.  HENT:     Head: Normocephalic and atraumatic.  Eyes:     Extraocular Movements: Extraocular movements intact.     Conjunctiva/sclera: Conjunctivae normal.  Cardiovascular:     Rate and Rhythm: Normal rate and regular rhythm.     Pulses: Normal pulses.     Heart sounds: Normal heart sounds.  Pulmonary:     Effort: Pulmonary effort is normal. No respiratory distress.     Breath sounds: Normal breath sounds.  Skin:         Comments: Patient has very large bruise/hematoma present to right buttocks.  Surrounding swelling that is very indurated as well.   Neurological:     General: No focal deficit present.     Mental Status: He is alert and oriented to person, place, and time. Mental status is at baseline.  Psychiatric:        Mood and Affect: Mood normal.        Behavior: Behavior normal.        Thought Content: Thought content normal.        Judgment: Judgment normal.      UC Treatments / Results  Labs (all labs ordered are listed, but only abnormal results are displayed) Labs Reviewed - No data to display  EKG   Radiology No results found.  Procedures Procedures (including critical  care time)  Medications Ordered in UC Medications - No data to display  Initial Impression / Assessment and Plan / UC Course  I have reviewed the triage vital signs and the nursing notes.  Pertinent labs & imaging results that were available during my care of the patient were reviewed by me and considered in my medical decision making (see chart for details).     Patient has large hematoma with surrounding swelling and induration which is very  concerning.  I do think the patient needs advanced imaging of this area which cannot be provided here at urgent care.  Patient also had oxygen of 79% on initial triage given that he left home without his oxygen.  Oxygen was applied with improvement in oxygen saturation to 95%.  Given all of these factors and recent syncopal episode/fall and patient taking blood thinning medications, I do think this warrants further evaluation and management at the hospital.  Patient was agreeable to going to the hospital via EMS.  Patient left via EMS transport. Final Clinical Impressions(s) / UC Diagnoses   Final diagnoses:  Hematoma  Fall, initial encounter  Shortness of breath     Discharge Instructions      Patient sent to hospital via EMS.    ED Prescriptions   None    PDMP not reviewed this encounter.   Gustavus Bryant, [place] 01/21/22 1858

## 2022-01-21 NOTE — ED Triage Notes (Signed)
Pt is present today with c/o fall and SOB. Pt states that he LOC and landed on his buttock x2 days ago. Pt denies hitting his head

## 2022-01-21 NOTE — Discharge Instructions (Signed)
Patient sent to hospital via EMS.  

## 2022-01-21 NOTE — ED Notes (Signed)
Patient is being discharged from the Urgent Care and sent to the Emergency Department via ems . Per haley, patient is in need of higher level of care due to hypoxia. Patient is aware and verbalizes understanding of plan of care.  Vitals:   01/21/22 1827 01/21/22 1834  BP:  (!) 111/57  Pulse: 87   Temp:  100.3 F (37.9 C)  SpO2: 98%

## 2022-01-21 NOTE — ED Triage Notes (Addendum)
Pt BIB EMS from UC for LOC 2 days ago. Per pt, he has a large hematoma on his bottom. Pt does take coumadin. Pt denies hitting his head, CP, or SOB. Pt wears 4L of O2 at home.

## 2022-01-22 ENCOUNTER — Emergency Department: Payer: Medicare Other

## 2022-01-22 ENCOUNTER — Inpatient Hospital Stay
Admission: EM | Admit: 2022-01-22 | Discharge: 2022-01-26 | DRG: 813 | Disposition: A | Discharge status: Home / Self Care | Payer: Medicare Other | Attending: Internal Medicine | Admitting: Internal Medicine

## 2022-01-22 ENCOUNTER — Encounter: Payer: Self-pay | Admitting: Radiology

## 2022-01-22 DIAGNOSIS — J9611 Chronic respiratory failure with hypoxia: Secondary | ICD-10-CM | POA: Diagnosis present

## 2022-01-22 DIAGNOSIS — D62 Acute posthemorrhagic anemia: Secondary | ICD-10-CM | POA: Diagnosis present

## 2022-01-22 DIAGNOSIS — R55 Syncope and collapse: Secondary | ICD-10-CM | POA: Insufficient documentation

## 2022-01-22 DIAGNOSIS — J449 Chronic obstructive pulmonary disease, unspecified: Secondary | ICD-10-CM | POA: Diagnosis present

## 2022-01-22 DIAGNOSIS — T148XXA Other injury of unspecified body region, initial encounter: Secondary | ICD-10-CM

## 2022-01-22 DIAGNOSIS — D6861 Antiphospholipid syndrome: Secondary | ICD-10-CM

## 2022-01-22 DIAGNOSIS — E1169 Type 2 diabetes mellitus with other specified complication: Secondary | ICD-10-CM | POA: Diagnosis present

## 2022-01-22 DIAGNOSIS — Z7901 Long term (current) use of anticoagulants: Secondary | ICD-10-CM | POA: Diagnosis not present

## 2022-01-22 DIAGNOSIS — N400 Enlarged prostate without lower urinary tract symptoms: Secondary | ICD-10-CM | POA: Diagnosis present

## 2022-01-22 DIAGNOSIS — T45515A Adverse effect of anticoagulants, initial encounter: Secondary | ICD-10-CM | POA: Diagnosis present

## 2022-01-22 DIAGNOSIS — I2510 Atherosclerotic heart disease of native coronary artery without angina pectoris: Secondary | ICD-10-CM | POA: Diagnosis present

## 2022-01-22 DIAGNOSIS — D6832 Hemorrhagic disorder due to extrinsic circulating anticoagulants: Secondary | ICD-10-CM | POA: Diagnosis present

## 2022-01-22 DIAGNOSIS — I5032 Chronic diastolic (congestive) heart failure: Secondary | ICD-10-CM | POA: Diagnosis present

## 2022-01-22 DIAGNOSIS — F17210 Nicotine dependence, cigarettes, uncomplicated: Secondary | ICD-10-CM | POA: Diagnosis present

## 2022-01-22 DIAGNOSIS — I110 Hypertensive heart disease with heart failure: Secondary | ICD-10-CM | POA: Diagnosis present

## 2022-01-22 DIAGNOSIS — I10 Essential (primary) hypertension: Secondary | ICD-10-CM | POA: Diagnosis present

## 2022-01-22 DIAGNOSIS — G450 Vertebro-basilar artery syndrome: Secondary | ICD-10-CM | POA: Diagnosis present

## 2022-01-22 DIAGNOSIS — R296 Repeated falls: Secondary | ICD-10-CM | POA: Diagnosis present

## 2022-01-22 DIAGNOSIS — Z9981 Dependence on supplemental oxygen: Secondary | ICD-10-CM | POA: Diagnosis not present

## 2022-01-22 DIAGNOSIS — D649 Anemia, unspecified: Principal | ICD-10-CM

## 2022-01-22 DIAGNOSIS — Z6837 Body mass index (BMI) 37.0-37.9, adult: Secondary | ICD-10-CM | POA: Diagnosis not present

## 2022-01-22 DIAGNOSIS — S300XXA Contusion of lower back and pelvis, initial encounter: Secondary | ICD-10-CM | POA: Diagnosis not present

## 2022-01-22 DIAGNOSIS — Z86711 Personal history of pulmonary embolism: Secondary | ICD-10-CM | POA: Diagnosis present

## 2022-01-22 DIAGNOSIS — E1151 Type 2 diabetes mellitus with diabetic peripheral angiopathy without gangrene: Secondary | ICD-10-CM | POA: Diagnosis present

## 2022-01-22 DIAGNOSIS — I4510 Unspecified right bundle-branch block: Secondary | ICD-10-CM | POA: Diagnosis present

## 2022-01-22 DIAGNOSIS — E669 Obesity, unspecified: Secondary | ICD-10-CM | POA: Diagnosis present

## 2022-01-22 DIAGNOSIS — E785 Hyperlipidemia, unspecified: Secondary | ICD-10-CM | POA: Diagnosis present

## 2022-01-22 DIAGNOSIS — I350 Nonrheumatic aortic (valve) stenosis: Secondary | ICD-10-CM | POA: Diagnosis present

## 2022-01-22 LAB — PROTIME-INR
INR: 2 — ABNORMAL HIGH (ref 0.8–1.2)
Prothrombin Time: 22.8 seconds — ABNORMAL HIGH (ref 11.4–15.2)

## 2022-01-22 LAB — HIV ANTIBODY (ROUTINE TESTING W REFLEX): HIV Screen 4th Generation wRfx: NONREACTIVE

## 2022-01-22 LAB — CBG MONITORING, ED
Glucose-Capillary: 92 mg/dL (ref 70–99)
Glucose-Capillary: 173 mg/dL — ABNORMAL HIGH (ref 70–99)
Glucose-Capillary: 201 mg/dL — ABNORMAL HIGH (ref 70–99)

## 2022-01-22 LAB — HEMOGLOBIN AND HEMATOCRIT, BLOOD
HCT: 25.8 % — ABNORMAL LOW (ref 39.0–52.0)
HCT: 26.5 % — ABNORMAL LOW (ref 39.0–52.0)
Hemoglobin: 7.7 g/dL — ABNORMAL LOW (ref 13.0–17.0)
Hemoglobin: 7.8 g/dL — ABNORMAL LOW (ref 13.0–17.0)

## 2022-01-22 LAB — GLUCOSE, CAPILLARY: Glucose-Capillary: 162 mg/dL — ABNORMAL HIGH (ref 70–99)

## 2022-01-22 LAB — HEMOGLOBIN: Hemoglobin: 7.3 g/dL — ABNORMAL LOW (ref 13.0–17.0)

## 2022-01-22 LAB — TROPONIN I (HIGH SENSITIVITY): Troponin I (High Sensitivity): 12 ng/L (ref <18)

## 2022-01-22 MED ORDER — HYDROCODONE-ACETAMINOPHEN 5-325 MG PO TABS
1.0000 | ORAL_TABLET | ORAL | Status: DC | PRN
  Administered 2022-01-22 – 2022-01-26 (×9): 2 via ORAL
  Filled 2022-01-22 (×9): qty 2

## 2022-01-22 MED ORDER — ROSUVASTATIN CALCIUM 10 MG PO TABS
10.0000 mg | ORAL_TABLET | Freq: Every evening | ORAL | Status: DC
  Administered 2022-01-22 – 2022-01-25 (×4): 10 mg via ORAL
  Filled 2022-01-22 (×4): qty 1

## 2022-01-22 MED ORDER — METOPROLOL TARTRATE 25 MG PO TABS
12.5000 mg | ORAL_TABLET | Freq: Two times a day (BID) | ORAL | Status: DC
  Administered 2022-01-22 – 2022-01-26 (×7): 12.5 mg via ORAL
  Filled 2022-01-22 (×9): qty 1

## 2022-01-22 MED ORDER — DULOXETINE HCL 30 MG PO CPEP
30.0000 mg | ORAL_CAPSULE | Freq: Every day | ORAL | Status: DC
  Administered 2022-01-22 – 2022-01-25 (×4): 30 mg via ORAL
  Filled 2022-01-22 (×5): qty 1

## 2022-01-22 MED ORDER — MORPHINE SULFATE (PF) 2 MG/ML IV SOLN: 2.0000 mg | INTRAVENOUS | Status: DC | PRN

## 2022-01-22 MED ORDER — PANTOPRAZOLE SODIUM 40 MG PO TBEC
40.0000 mg | DELAYED_RELEASE_TABLET | Freq: Two times a day (BID) | ORAL | Status: DC
  Administered 2022-01-22 – 2022-01-26 (×9): 40 mg via ORAL
  Filled 2022-01-22 (×9): qty 1

## 2022-01-22 MED ORDER — ACETAMINOPHEN 325 MG PO TABS
650.0000 mg | ORAL_TABLET | Freq: Four times a day (QID) | ORAL | Status: DC | PRN
  Administered 2022-01-23: 650 mg via ORAL
  Filled 2022-01-22: qty 2

## 2022-01-22 MED ORDER — FLUTICASONE FUROATE-VILANTEROL 200-25 MCG/ACT IN AEPB
1.0000 | INHALATION_SPRAY | Freq: Every day | RESPIRATORY_TRACT | Status: DC
  Filled 2022-01-22: qty 28

## 2022-01-22 MED ORDER — DULOXETINE HCL 30 MG PO CPEP
60.0000 mg | ORAL_CAPSULE | Freq: Every day | ORAL | Status: DC
  Administered 2022-01-22 – 2022-01-25 (×4): 60 mg via ORAL
  Filled 2022-01-22 (×4): qty 2
  Filled 2022-01-22: qty 1

## 2022-01-22 MED ORDER — ACETAMINOPHEN 650 MG RE SUPP: 650.0000 mg | Freq: Four times a day (QID) | RECTAL | Status: DC | PRN

## 2022-01-22 MED ORDER — UMECLIDINIUM BROMIDE 62.5 MCG/ACT IN AEPB: 1.0000 | INHALATION_SPRAY | Freq: Every day | RESPIRATORY_TRACT | Status: DC

## 2022-01-22 MED ORDER — SALINE SPRAY 0.65 % NA SOLN
1.0000 | NASAL | Status: DC | PRN
  Administered 2022-01-22: 1 via NASAL
  Filled 2022-01-22: qty 44

## 2022-01-22 MED ORDER — GABAPENTIN 600 MG PO TABS
600.0000 mg | ORAL_TABLET | Freq: Three times a day (TID) | ORAL | Status: DC
  Administered 2022-01-22 – 2022-01-26 (×12): 600 mg via ORAL
  Filled 2022-01-22 (×12): qty 1

## 2022-01-22 MED ORDER — INSULIN ASPART 100 UNIT/ML IJ SOLN
0.0000 [IU] | Freq: Three times a day (TID) | INTRAMUSCULAR | Status: DC
  Administered 2022-01-22: 3 [IU] via SUBCUTANEOUS
  Administered 2022-01-22: 5 [IU] via SUBCUTANEOUS
  Administered 2022-01-23 (×3): 2 [IU] via SUBCUTANEOUS
  Administered 2022-01-24: 3 [IU] via SUBCUTANEOUS
  Administered 2022-01-24 – 2022-01-26 (×6): 2 [IU] via SUBCUTANEOUS
  Filled 2022-01-22 (×11): qty 1

## 2022-01-22 MED ORDER — INSULIN ASPART 100 UNIT/ML IJ SOLN: 0.0000 [IU] | Freq: Every day | INTRAMUSCULAR | Status: DC

## 2022-01-22 MED ORDER — IPRATROPIUM-ALBUTEROL 0.5-2.5 (3) MG/3ML IN SOLN
3.0000 mL | RESPIRATORY_TRACT | Status: DC | PRN
  Administered 2022-01-22: 3 mL via RESPIRATORY_TRACT
  Filled 2022-01-22: qty 3

## 2022-01-22 MED ORDER — IOHEXOL 350 MG/ML SOLN
100.0000 mL | Freq: Once | INTRAVENOUS | Status: AC | PRN
  Administered 2022-01-22: 100 mL via INTRAVENOUS

## 2022-01-22 MED ORDER — TOPIRAMATE 25 MG PO TABS
75.0000 mg | ORAL_TABLET | Freq: Three times a day (TID) | ORAL | Status: DC
  Administered 2022-01-22 – 2022-01-26 (×11): 75 mg via ORAL
  Filled 2022-01-22 (×12): qty 3

## 2022-01-22 MED ORDER — ASPIRIN 81 MG PO TBEC
81.0000 mg | DELAYED_RELEASE_TABLET | Freq: Every day | ORAL | Status: DC
  Administered 2022-01-22 – 2022-01-26 (×5): 81 mg via ORAL
  Filled 2022-01-22 (×5): qty 1

## 2022-01-22 MED ORDER — UMECLIDINIUM BROMIDE 62.5 MCG/ACT IN AEPB
1.0000 | INHALATION_SPRAY | Freq: Every day | RESPIRATORY_TRACT | Status: DC
  Administered 2022-01-23 – 2022-01-26 (×4): 1 via RESPIRATORY_TRACT
  Filled 2022-01-22: qty 7

## 2022-01-22 MED ORDER — FLUTICASONE FUROATE-VILANTEROL 200-25 MCG/ACT IN AEPB
1.0000 | INHALATION_SPRAY | Freq: Every day | RESPIRATORY_TRACT | Status: DC
  Administered 2022-01-23 – 2022-01-26 (×4): 1 via RESPIRATORY_TRACT
  Filled 2022-01-22: qty 28

## 2022-01-22 MED ORDER — TAMSULOSIN HCL 0.4 MG PO CAPS
0.8000 mg | ORAL_CAPSULE | Freq: Every day | ORAL | Status: DC
  Administered 2022-01-22 – 2022-01-26 (×5): 0.8 mg via ORAL
  Filled 2022-01-22 (×5): qty 2

## 2022-01-22 MED ORDER — NITROGLYCERIN 0.4 MG SL SUBL: 0.4000 mg | SUBLINGUAL_TABLET | SUBLINGUAL | Status: DC | PRN

## 2022-01-22 MED ORDER — TORSEMIDE 20 MG PO TABS
20.0000 mg | ORAL_TABLET | Freq: Every day | ORAL | Status: DC
  Administered 2022-01-22 – 2022-01-26 (×5): 20 mg via ORAL
  Filled 2022-01-22 (×5): qty 1

## 2022-01-22 MED ORDER — ALBUTEROL SULFATE (2.5 MG/3ML) 0.083% IN NEBU: 2.5000 mg | INHALATION_SOLUTION | Freq: Four times a day (QID) | RESPIRATORY_TRACT | Status: DC | PRN

## 2022-01-22 NOTE — Assessment & Plan Note (Addendum)
Continue metoprolol, rosuvastatin, aspirin and nitroglycerin

## 2022-01-22 NOTE — H&P (Signed)
History and Physical    Patient: Jose [name] BTD:[id number] DOB: 26-Jun-1955 DOA: 01/22/2022 DOS: the patient was seen and examined on 01/22/2022 PCP: [name], MD  Patient coming from: Home  Chief Complaint:  Chief Complaint  Patient presents with   Loss of Consciousness    HPI: Jose [name] is a 66 y.o. male with medical history significant for CAD s/p CABG x3, mild aortic valve stenosis, peripheral vascular disease, obstructive sleep apnea, COPD on 4 to 6 L noncompliant with trilogy, type 2 diabetes, hyperlipidemia, pulmonary embolism, antiphospholipid antibody syndrome on warfarin, as well as vertebrobasilar insufficiency causing recurrent syncope with a syncopal episode 2 days prior who was sent from urgent care for evaluation of hematoma on his buttock.  Patient denies falling but stated when he had his last syncopal episode 2 days prior he had a controlled slide to the ground and sat on the ground for a while. ED course and data review: Vitals within normal limits.  Labs second current for hemoglobin 7.8, down from baseline of 9.7 a couple months prior with INR 2.2.  Troponin 12 and BNP 87.  EKG, personally viewed and interpreted showing NSR at 86 with RBBB. Chest x-ray shows cardiomegaly with mild interstitial edema CTA abdomen and pelvis with and without contrast showing the following: IMPRESSION: VASCULAR   Hematoma in the right gluteal subcutaneous fat. No evidence of active bleeding.   No acute aortic syndrome.  Aortic Atherosclerosis (ICD10-I70.0).   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   NON-VASCULAR   No acute abnormality in the abdomen or pelvis.   Bilateral nonobstructing nephrolithiasis measuring up to 1.0 cm on the right.   Hepatic steatosis.   Hospitalist consulted for admission.   Review of Systems: As mentioned in the history of present illness. All other systems reviewed and are negative.  Past Medical History:  Diagnosis  Date   Antiphospholipid antibody syndrome [facility])    CAD S/P percutaneous coronary angioplasty '98, '04, Feb 2017   [facility]   Carotid arterial disease (HCC) 10/2015   moderate bilateral   COPD (chronic obstructive pulmonary disease) (HCC)    Coronary artery disease    Hypertension    Non-insulin treated type 2 diabetes mellitus (HCC)    Pulmonary emboli (HCC) 2011   RBBB    Sleep apnea    Past Surgical History:  Procedure Laterality Date   APPENDECTOMY     APPLICATION OF WOUND VAC Left 03/18/2019   Procedure: Application Of Wound Vac;  Surgeon: Nadara Mustard, MD;  Location: [facility] OR;  Service: Orthopedics;  Laterality: Left;   BACK SURGERY  '89, '06   COLONOSCOPY     CORONARY ANGIOPLASTY WITH STENT PLACEMENT  '98, '04, Feb 2017   I & D EXTREMITY Left 03/15/2019   Procedure: DEBRIDEMENT LEFT THIGH;  Surgeon: Nadara Mustard, MD;  Location: [facility] OR;  Service: Orthopedics;  Laterality: Left;   INCISION AND DRAINAGE OF WOUND Left 03/18/2019   Procedure: LEFT THIGH DEBRIDEMENT;  Surgeon: Nadara Mustard, MD;  Location: [facility] OR;  Service: Orthopedics;  Laterality: Left;   Social History:  reports that he quit smoking about 3 years ago. His smoking use included cigarettes. He has a 20.00 pack-year smoking history. He has never used smokeless tobacco. He reports that he does not drink alcohol and does not use drugs.  Allergies  Allergen Reactions   Melatonin Anxiety   Altace [Ramipril] Cough    Family History  Problem Relation Age of  Onset   CAD Father 29    Prior to Admission medications   Medication Sig Start Date End Date Taking? Authorizing Provider  acetaminophen (TYLENOL) 650 MG CR tablet Take 1,950 mg by mouth every 8 (eight) hours as needed for pain.    [provider]  albuterol (PROVENTIL) (5 MG/ML) 0.5% nebulizer solution Take 0.5 mLs (2.5 mg total) by nebulization every 6 (six) hours as needed for wheezing or shortness of breath. 12/27/18   Terald Sleeper, MD  albuterol  (VENTOLIN HFA) 108 (90 Base) MCG/ACT inhaler Inhale 2 puffs into the lungs every 6 (six) hours as needed for wheezing or shortness of breath.    [provider]  aspirin EC 81 MG EC tablet Take 1 tablet (81 mg total) by mouth daily. Swallow whole. 01/23/20   Dagar, Geralynn Rile, MD  DULoxetine (CYMBALTA) 30 MG capsule Take 30 mg by mouth daily. Take one capsule (30 mg) daily along with 60 mg capsule to equal 90 mg daily 11/02/21   [provider]  DULoxetine (CYMBALTA) 60 MG capsule Take 60 mg by mouth daily. Take one capsule (60 mg) along with 30 mg capsule to equal 90 mg daily    [provider]  Fluticasone-Umeclidin-Vilant (TRELEGY ELLIPTA) 200-62.5-25 MCG/ACT AEPB Inhale 1 puff into the lungs daily.    [provider]  gabapentin (NEURONTIN) 600 MG tablet Take 600 mg by mouth 3 (three) times daily.    [provider]  glimepiride (AMARYL) 4 MG tablet Take 4 mg by mouth 2 (two) times daily. 12/04/20   [provider]  ipratropium-albuterol (DUONEB) 0.5-2.5 (3) MG/3ML SOLN Take 3 mLs by nebulization every 4 (four) hours as needed (For shortness of breath or wheezing).    [provider]  metoprolol tartrate (LOPRESSOR) 12.5 mg TABS tablet Take 12.5 mg by mouth 2 (two) times daily.    [provider]  nitroGLYCERIN (NITROSTAT) 0.4 MG SL tablet Place 0.4 mg under the tongue every 5 (five) minutes as needed for chest pain.  07/07/16   [provider]  OXYGEN Inhale 4-6 L into the lungs daily.    [provider]  pantoprazole (PROTONIX) 40 MG tablet Take 40 mg by mouth 2 (two) times daily.    [provider]  rosuvastatin (CRESTOR) 10 MG tablet Take 10 mg by mouth every evening. 07/07/16   [provider]  tamsulosin (FLOMAX) 0.4 MG CAPS capsule Take 0.8 mg by mouth daily.    [provider]  tiZANidine (ZANAFLEX) 4 MG tablet Take 4 mg by mouth 3 (three) times daily as needed for muscle spasms.  05/29/21   [provider]  topiramate (TOPAMAX) 50 MG tablet Take 75 mg by mouth 2 (two) times daily.    [provider]  torsemide (DEMADEX) 20 MG tablet Take 20 mg by mouth daily.    [provider]  warfarin (COUMADIN) 6 MG tablet Take 5 mg by mouth See admin instructions. Take one tablet (5 mg) Tuesday, Wednesday, Thursday, Saturday and Sunday Then take (6 mg) Monday and Friday    [provider]    Physical Exam: Vitals:   01/21/22 2002 01/22/22 0015 01/22/22 0300 01/22/22 0415  BP:  (!) 150/80 (!) 144/70 134/72  Pulse:  76 74 86  Resp:  20 20 18   Temp:  98.7 F (37.1 C)  98.4 F (36.9 C)  TempSrc:  Oral  Oral  SpO2:  98% 100% 100%  Weight: 108.9 kg  Height: 5\' 8"  (1.727 m)      Physical Exam Vitals and nursing note reviewed.  Constitutional:      General: He is not in acute distress. HENT:     Head: Normocephalic and atraumatic.  Cardiovascular:     Rate and Rhythm: Normal rate and regular rhythm.     Heart sounds: Normal heart sounds.  Pulmonary:     Effort: Pulmonary effort is normal.     Breath sounds: Normal breath sounds.  Abdominal:     Palpations: Abdomen is soft.     Tenderness: There is no abdominal tenderness.  Skin:    Comments: See pic below  Neurological:     Mental Status: Mental status is at baseline.     Labs on Admission: I have personally reviewed following labs and imaging studies  CBC: Recent Labs  Lab 01/21/22 2005  WBC 12.0*  HGB 7.8*  HCT 25.7*  MCV 94.1  PLT 232   Basic Metabolic Panel: Recent Labs  Lab 01/21/22 2005  NA 142  K 3.5  CL 96*  CO2 38*  GLUCOSE 82  BUN 27*  CREATININE 0.97  CALCIUM 8.8*   GFR: Estimated Creatinine Clearance: 89.6 mL/min (by C-G formula based on SCr of 0.97 mg/dL). Liver Function Tests: No results for input(s): "AST", "ALT", "ALKPHOS", "BILITOT", "PROT", "ALBUMIN" in the last 168 hours. No results for input(s): "LIPASE", "AMYLASE" in the last 168  hours. No results for input(s): "AMMONIA" in the last 168 hours. Coagulation Profile: Recent Labs  Lab 01/21/22 2005  INR 2.2*   Cardiac Enzymes: No results for input(s): "CKTOTAL", "CKMB", "CKMBINDEX", "TROPONINI" in the last 168 hours. BNP (last 3 results) No results for input(s): "PROBNP" in the last 8760 hours. HbA1C: No results for input(s): "HGBA1C" in the last 72 hours. CBG: No results for input(s): "GLUCAP" in the last 168 hours. Lipid Profile: No results for input(s): "CHOL", "HDL", "LDLCALC", "TRIG", "CHOLHDL", "LDLDIRECT" in the last 72 hours. Thyroid Function Tests: No results for input(s): "TSH", "T4TOTAL", "FREET4", "T3FREE", "THYROIDAB" in the last 72 hours. Anemia Panel: No results for input(s): "VITAMINB12", "FOLATE", "FERRITIN", "TIBC", "IRON", "RETICCTPCT" in the last 72 hours. Urine analysis:    Component Value Date/Time   COLORURINE YELLOW 11/05/2021 2031   APPEARANCEUR CLEAR 11/05/2021 2031   LABSPEC 1.017 11/05/2021 2031   PHURINE 5.0 11/05/2021 2031   GLUCOSEU NEGATIVE 11/05/2021 2031   HGBUR NEGATIVE 11/05/2021 2031   BILIRUBINUR NEGATIVE 11/05/2021 2031   KETONESUR NEGATIVE 11/05/2021 2031   PROTEINUR NEGATIVE 11/05/2021 2031   NITRITE NEGATIVE 11/05/2021 2031   LEUKOCYTESUR NEGATIVE 11/05/2021 2031    Radiological Exams on Admission: CT Angio Abd/Pel W and/or Wo Contrast  Result Date: 01/22/2022 CLINICAL DATA:  Hematoma in right buttock on Coumadin EXAM: CTA ABDOMEN AND PELVIS WITHOUT AND WITH CONTRAST TECHNIQUE: Multidetector CT imaging of the abdomen and pelvis was performed using the standard protocol during bolus administration of intravenous contrast. Multiplanar reconstructed images and MIPs were obtained and reviewed to evaluate the vascular anatomy. RADIATION DOSE REDUCTION: This exam was performed according to the departmental dose-optimization program which includes automated exposure control, adjustment of the mA and/or kV according to  patient size and/or use of iterative reconstruction technique. CONTRAST:  13/11/2021 OMNIPAQUE IOHEXOL 350 MG/ML SOLN COMPARISON:  CT abdomen and pelvis 01/20/2020 FINDINGS: VASCULAR Aorta: Calcified athero sclerotic plaque without significant narrowing. No aneurysm or dissection. Celiac: Atherosclerotic plaque at the origin causes moderate narrowing. No aneurysm or dissection. Normal variant origin of the left gastric  directly from the aorta. SMA: Patent without evidence of aneurysm, dissection, vasculitis or significant stenosis. Renals: Both renal arteries are patent without evidence of aneurysm, dissection, vasculitis, fibromuscular dysplasia or significant stenosis. IMA: Patent without evidence of aneurysm, dissection, vasculitis or significant stenosis. 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 Calcified athero sclerotic plaque in both common, internal, and external iliac arteries. No dissection. Proximal Outflow: Bilateral common femoral and visualized portions of the superficial and profunda femoral arteries are patent without evidence of aneurysm, dissection, vasculitis or significant stenosis. Veins: Unremarkable. Review of the MIP images confirms the above findings. NON-VASCULAR Lower chest: Bibasilar scarring/atelectasis.  No acute abnormality. Hepatobiliary: Hepatic steatosis. No focal liver abnormality is seen. No gallstones, gallbladder wall thickening, or biliary dilatation. Pancreas: Unremarkable. No pancreatic ductal dilatation or surrounding inflammatory changes. Spleen: Normal in size without focal abnormality. Adrenals/Urinary Tract: Unremarkable adrenal glands. Bilateral nephrolithiasis measuring up to 1.0 cm right. No hydronephrosis or obstructing urinary calculi. Unremarkable bladder. Stomach/Bowel: Normal caliber large and small bowel. Moderate colonic stool burden. Unremarkable stomach. Lymphatic: Unchanged bilateral external iliac lymphadenopathy measuring up to 1.4 cm  on the right. Reproductive: Unremarkable. Other: No free intraperitoneal fluid or air. Musculoskeletal: Edema or contusion about the bilateral posterior gluteal subcutaneous fat. Within the right gluteal subcutaneous fat there is a hyperdense fluid collection measuring approximately 9.3 by 3.7 cm. No evidence of active contrast extravasation. This does not change in density between phases. Thoracolumbar spondylosis. No acute osseous abnormality. IMPRESSION: VASCULAR Hematoma in the right gluteal subcutaneous fat. No evidence of active bleeding. No acute aortic syndrome.  Aortic Atherosclerosis (ICD10-I70.0). 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 NON-VASCULAR No acute abnormality in the abdomen or pelvis. Bilateral nonobstructing nephrolithiasis measuring up to 1.0 cm on the right. Hepatic steatosis. Electronically Signed   By: Minerva Fester M.D.   On: 01/22/2022 03:51   DG Chest 1 View  Result Date: 01/21/2022 CLINICAL DATA:  Loss of consciousness and low oxygen saturation; history of COPD EXAM: CHEST  1 VIEW COMPARISON:  Radiograph 11/08/2018 FINDINGS: Stable cardiomegaly.  Sternotomy and CABG. Increased interstitial coarsening compared with 11/07/2021. Bibasilar atelectasis. Question trace bilateral pleural effusions. No pneumothorax. No acute osseous abnormality. IMPRESSION: Cardiomegaly with possible mild interstitial edema. Question trace bilateral pleural effusions. Electronically Signed   By: Minerva Fester M.D.   On: 01/21/2022 20:38     Data Reviewed: Relevant notes from primary care and specialist visits, past discharge summaries as available in EHR, including Care Everywhere. Prior diagnostic testing as pertinent to current admission diagnoses Updated medications and problem lists for reconciliation ED course, including vitals, labs, imaging, treatment and response to treatment Triage notes, nursing and pharmacy notes and ED provider's notes Notable results as noted in  HPI   Assessment and Plan: * Traumatic hematoma of buttock Acute blood loss anemia Chronic anticoagulation for history of PE Hold Coumadin and reverse with vitamin K if hemoglobin downtrending Trend H&H and transfuse if necessary Consider surgical consult to evaluate for hematoma evacuation Pain control   Recurrent syncope Fall precautions  Type 2 diabetes mellitus with hyperlipidemia (HCC) Sliding scale insulin coverage  CAD S/P multiple PCIs Continue metoprolol, rosuvastatin, aspirin and nitroglycerin  COPD (chronic obstructive pulmonary disease) (HCC) Chronic respiratory failure with hypoxia Not acutely exacerbated DuoNebs as needed Continue supplemental oxygen  Essential hypertension, benign Continue metoprolol        DVT prophylaxis: SCD  Consults: none  Advance Care Planning:   Code Status: Prior  Family Communication: none  Disposition Plan: Back to previous home environment  Severity of Illness: The appropriate patient status for this patient is OBSERVATION. Observation status is judged to be reasonable and necessary in order to provide the required intensity of service to ensure the patient's safety. The patient's presenting symptoms, physical exam findings, and initial radiographic and laboratory data in the context of their medical condition is felt to place them at decreased risk for further clinical deterioration. Furthermore, it is anticipated that the patient will be medically stable for discharge from the hospital within 2 midnights of admission.   Author: Andris BaumannHazel V Danilo Cappiello, MD 01/22/2022 4:50 AM  For on call review [email or web address].

## 2022-01-22 NOTE — Assessment & Plan Note (Addendum)
Acute blood loss anemia Chronic anticoagulation for history of PE Hold Coumadin and reverse with vitamin K if hemoglobin downtrending Trend H&H and transfuse if necessary Consider surgical consult to evaluate for hematoma evacuation Pain control

## 2022-01-22 NOTE — ED Notes (Signed)
Pt back from CT

## 2022-01-22 NOTE — Progress Notes (Signed)
Pt placed on bipap as ordered. Pt did not tolerated bipap at this time. Pt tolerating 4L nasal cannula well at this time.

## 2022-01-22 NOTE — Assessment & Plan Note (Signed)
Fall precautions.  

## 2022-01-22 NOTE — Assessment & Plan Note (Signed)
Chronic respiratory failure with hypoxia Not acutely exacerbated DuoNebs as needed Continue supplemental oxygen

## 2022-01-22 NOTE — ED Notes (Signed)
Pt to CT

## 2022-01-22 NOTE — ED Notes (Signed)
Dr. Duncan @ the bedside. 

## 2022-01-22 NOTE — ED Provider Notes (Signed)
[facility] Provider Note    Event Date/Time   First MD Initiated Contact with Patient 01/22/22 0127     (approximate)   History   Loss of Consciousness   HPI  Delena BaliJeffrey Batres is a 66 y.o. male referred to the ED from urgent care for further evaluation of right buttock hematoma.  Patient with a history of vertebrobasilar disorder causing recurrent syncope; on warfarin.  Had a near syncopal episode 2 days ago, was able to stop himself from falling by sitting on the ground.  Seen at urgent care for right buttock pain, found to have large hematoma and sent to the ED for further imaging.  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  Patient is not concerned regarding the syncope because he states this happens all the time; chief complaint is regarding the right buttock hematoma.     Past Medical History   Past Medical History:  Diagnosis Date   Antiphospholipid antibody syndrome (HCC)    CAD S/P percutaneous coronary angioplasty '98, '04, Feb 2017   [facility]   Carotid arterial disease (HCC) 10/2015   moderate bilateral   COPD (chronic obstructive pulmonary disease) (HCC)    Coronary artery disease    Hypertension    Non-insulin treated type 2 diabetes mellitus (HCC)    Pulmonary emboli (HCC) 2011   RBBB    Sleep apnea      Active Problem List   Patient Active Problem List   Diagnosis Date Noted   Traumatic hematoma of buttock 01/22/2022   Chronic respiratory failure with hypoxia (HCC) 01/22/2022   ABLA (acute blood loss anemia) 01/22/2022   COPD with acute exacerbation (HCC) 11/06/2021   Acute on chronic respiratory failure with hypercapnia (HCC) 11/05/2021   AKI (acute kidney injury) (HCC) 11/05/2021   Obesity (BMI 30-39.9)    Benign prostatic hyperplasia without lower urinary tract symptoms    COPD exacerbation (HCC)  07/13/2020   Prolonged QT interval 07/13/2020   Acute on chronic respiratory failure with hypoxia (HCC) 07/13/2020   Chronic diastolic CHF (congestive heart failure) (HCC) 07/13/2020   Community acquired pneumonia 01/18/2020   Coronary atherosclerosis 10/04/2017   GERD (gastroesophageal reflux disease) 10/04/2017   Essential hypertension 10/04/2017   Transient visual loss 10/04/2017   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   Nonrheumatic aortic valve stenosis 03/31/2017   Pericarditis, Acute 02/12/2017   Acute on chronic right heart failure (HCC) 02/11/2017   Angina at rest 08/17/2016   Unstable angina (HCC) 08/16/2016   Tobacco abuse 08/16/2016   Antiphospholipid syndrome (HCC) 08/16/2016   Hyperlipidemia 08/16/2016   Essential hypertension, benign 08/16/2016   COPD (chronic obstructive pulmonary disease) (HCC) 08/16/2016   Sleep apnea 08/16/2016   CAD S/P multiple PCIs 08/16/2016   History of pulmonary embolus (PE)-2011 08/16/2016   Chronic anticoagulation-Couamdin 08/16/2016   Type 2 diabetes mellitus with hyperlipidemia (HCC) 08/16/2016   Carotid artery disease (HCC) 08/16/2016   RBBB (right bundle branch block) 08/16/2016   Dyspnea on exertion 10/17/2015   ED (erectile dysfunction) 08/09/2012     Past Surgical History   Past Surgical History:  Procedure Laterality Date   APPENDECTOMY     APPLICATION OF WOUND VAC Left 03/18/2019   Procedure: Application Of Wound Vac;  Surgeon: Nadara Mustarduda, Marcus V, MD;  [facility];  Service: Orthopedics;  Laterality:  Left;   BACK SURGERY  '89, '06   COLONOSCOPY     CORONARY ANGIOPLASTY WITH STENT PLACEMENT  '98, '04, Feb 2017   I & D EXTREMITY Left 03/15/2019   Procedure: DEBRIDEMENT LEFT THIGH;  Surgeon: Nadara Mustard, MD;  Location: [facility] OR;  Service: Orthopedics;  Laterality: Left;   INCISION AND DRAINAGE OF WOUND Left 03/18/2019   Procedure: LEFT THIGH DEBRIDEMENT;  Surgeon: Nadara Mustard, MD;  Location: [facility] OR;  Service: Orthopedics;  Laterality:  Left;     Home Medications   Prior to Admission medications   Medication Sig Start Date End Date Taking? Authorizing Provider  acetaminophen (TYLENOL) 650 MG CR tablet Take 1,950 mg by mouth every 8 (eight) hours as needed for pain.    [provider]  albuterol (PROVENTIL) (5 MG/ML) 0.5% nebulizer solution Take 0.5 mLs (2.5 mg total) by nebulization every 6 (six) hours as needed for wheezing or shortness of breath. 12/27/18   Terald Sleeper, MD  albuterol (VENTOLIN HFA) 108 (90 Base) MCG/ACT inhaler Inhale 2 puffs into the lungs every 6 (six) hours as needed for wheezing or shortness of breath.    [provider]  aspirin EC 81 MG EC tablet Take 1 tablet (81 mg total) by mouth daily. Swallow whole. 01/23/20   Dagar, Geralynn Rile, MD  DULoxetine (CYMBALTA) 30 MG capsule Take 30 mg by mouth daily. Take one capsule (30 mg) daily along with 60 mg capsule to equal 90 mg daily 11/02/21   [provider]  DULoxetine (CYMBALTA) 60 MG capsule Take 60 mg by mouth daily. Take one capsule (60 mg) along with 30 mg capsule to equal 90 mg daily    [provider]  Fluticasone-Umeclidin-Vilant (TRELEGY ELLIPTA) 200-62.5-25 MCG/ACT AEPB Inhale 1 puff into the lungs daily.    [provider]  gabapentin (NEURONTIN) 600 MG tablet Take 600 mg by mouth 3 (three) times daily.    [provider]  glimepiride (AMARYL) 4 MG tablet Take 4 mg by mouth 2 (two) times daily. 12/04/20   [provider]  ipratropium-albuterol (DUONEB) 0.5-2.5 (3) MG/3ML SOLN Take 3 mLs by nebulization every 4 (four) hours as needed (For shortness of breath or wheezing).    [provider]  metoprolol tartrate (LOPRESSOR) 12.5 mg TABS tablet Take 12.5 mg by mouth 2 (two) times daily.    [provider]  nitroGLYCERIN (NITROSTAT) 0.4 MG SL tablet Place 0.4 mg under the tongue every 5 (five) minutes as needed for chest pain.  07/07/16   [provider]  OXYGEN  Inhale 4-6 L into the lungs daily.    [provider]  pantoprazole (PROTONIX) 40 MG tablet Take 40 mg by mouth 2 (two) times daily.    [provider]  rosuvastatin (CRESTOR) 10 MG tablet Take 10 mg by mouth every evening. 07/07/16   [provider]  tamsulosin (FLOMAX) 0.4 MG CAPS capsule Take 0.8 mg by mouth daily.    [provider]  tiZANidine (ZANAFLEX) 4 MG tablet Take 4 mg by mouth 3 (three) times daily as needed for muscle spasms. 05/29/21   [provider]  topiramate (TOPAMAX) 50 MG tablet Take 75 mg by mouth 2 (two) times daily.    [provider]  torsemide (DEMADEX) 20 MG tablet Take 20 mg by mouth daily.    [provider]  warfarin (COUMADIN) 6 MG tablet Take 5 mg by mouth See admin instructions. Take one tablet (5 mg) Tuesday, Wednesday,  Thursday, Saturday and Sunday Then take (6 mg) Monday and Friday    [provider]     Allergies  Melatonin and Altace [ramipril]   Family History   Family History  Problem Relation Age of Onset   CAD Father 40     Physical Exam  Triage Vital Signs: ED Triage Vitals  Enc Vitals Group     BP 01/21/22 2000 127/68     Pulse Rate 01/21/22 2000 85     Resp 01/21/22 2000 20     Temp 01/21/22 2000 98.7 F (37.1 C)     Temp Source 01/21/22 2000 Oral     SpO2 01/21/22 2000 98 %     Weight 01/21/22 2002 240 lb (108.9 kg)     Height 01/21/22 2002 5\' 8"  (1.727 m)     Head Circumference --      Peak Flow --      Pain Score --      Pain Loc --      Pain Edu? --      Excl. in GC? --     Updated Vital Signs: BP 134/72 (BP Location: Left Arm)   Pulse 86   Temp 98.4 F (36.9 C) (Oral)   Resp 18   Ht 5\' 8"  (1.727 m)   Wt 108.9 kg   SpO2 100%   BMI 36.49 kg/m    General: Awake, mild distress.  CV:  RRR.  Good peripheral perfusion.  Resp:  Normal effort.  Slightly diminished, otherwise CTAB. Abd:  Nontender.  No distention.  Other:  Large right buttock  hematoma with surrounding firm soft tissue which is not tense; no pallor.   ED Results / Procedures / Treatments  Labs (all labs ordered are listed, but only abnormal results are displayed) Labs Reviewed  BASIC METABOLIC PANEL - Abnormal; Notable for the following components:      Result Value   Chloride 96 (*)    CO2 38 (*)    BUN 27 (*)    Calcium 8.8 (*)    All other components within normal limits  CBC - Abnormal; Notable for the following components:   WBC 12.0 (*)    RBC 2.73 (*)    Hemoglobin 7.8 (*)    HCT 25.7 (*)    RDW 15.9 (*)    All other components within normal limits  PROTIME-INR - Abnormal; Notable for the following components:   Prothrombin Time 24.2 (*)    INR 2.2 (*)    All other components within normal limits  BRAIN NATRIURETIC PEPTIDE  HEMOGLOBIN  HIV ANTIBODY (ROUTINE TESTING W REFLEX)  TYPE AND SCREEN  TROPONIN I (HIGH SENSITIVITY)  TROPONIN I (HIGH SENSITIVITY)     EKG  ED ECG REPORT I, Bayleigh Loflin J, the attending physician, personally viewed and interpreted this ECG.   Date: 01/22/2022  EKG Time: 2002  Rate: 86  Rhythm: normal sinus rhythm  Axis: Normal  Intervals:right bundle branch block  ST&T Change: Nonspecific    RADIOLOGY I have independently visualized and interpreted patient's x-ray and CT scan as well as noted the radiology interpretation:  X-ray: Cardiomegaly with mild possible interstitial edema  CT abdomen/pelvis: Contusion about bilateral gluteus cutaneous fat with no active extravasation  Official radiology report(s): CT Angio Abd/Pel W and/or Wo Contrast  Result Date: 01/22/2022 CLINICAL DATA:  Hematoma in right buttock on Coumadin EXAM: CTA ABDOMEN AND PELVIS WITHOUT AND WITH CONTRAST TECHNIQUE: Multidetector CT imaging of the abdomen and pelvis was performed  using the standard protocol during bolus administration of intravenous contrast. Multiplanar reconstructed images and MIPs were obtained and reviewed to  evaluate the vascular anatomy. RADIATION DOSE REDUCTION: This exam was performed according to the departmental dose-optimization program which includes automated exposure control, adjustment of the mA and/or kV according to patient size and/or use of iterative reconstruction technique. CONTRAST:  OMNIPAQUE IOHEXOL 350 MG/ML SOLN COMPARISON:  CT abdomen and pelvis 01/20/2020 FINDINGS: VASCULAR Aorta: Calcified athero sclerotic plaque without significant narrowing. No aneurysm or dissection. Celiac: Atherosclerotic plaque at the origin causes moderate narrowing. No aneurysm or dissection. Normal variant origin of the left gastric directly from the aorta. SMA: Patent without evidence of aneurysm, dissection, vasculitis or significant stenosis. Renals: Both renal arteries are patent without evidence of aneurysm, dissection, vasculitis, fibromuscular dysplasia or significant stenosis. IMA: Patent without evidence of aneurysm, dissection, vasculitis or significant stenosis. 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 Calcified athero sclerotic plaque in both common, internal, and external iliac arteries. No dissection. Proximal Outflow: Bilateral common femoral and visualized portions of the superficial and profunda femoral arteries are patent without evidence of aneurysm, dissection, vasculitis or significant stenosis. Veins: Unremarkable. Review of the MIP images confirms the above findings. NON-VASCULAR Lower chest: Bibasilar scarring/atelectasis.  No acute abnormality. Hepatobiliary: Hepatic steatosis. No focal liver abnormality is seen. No gallstones, gallbladder wall thickening, or biliary dilatation. Pancreas: Unremarkable. No pancreatic ductal dilatation or surrounding inflammatory changes. Spleen: Normal in size without focal abnormality. Adrenals/Urinary Tract: Unremarkable adrenal glands. Bilateral nephrolithiasis measuring up to 1.0 cm right. No hydronephrosis or obstructing urinary  calculi. Unremarkable bladder. Stomach/Bowel: Normal caliber large and small bowel. Moderate colonic stool burden. Unremarkable stomach. Lymphatic: Unchanged bilateral external iliac lymphadenopathy measuring up to 1.4 cm on the right. Reproductive: Unremarkable. Other: No free intraperitoneal fluid or air. Musculoskeletal: Edema or contusion about the bilateral posterior gluteal subcutaneous fat. Within the right gluteal subcutaneous fat there is a hyperdense fluid collection measuring approximately 9.3 by 3.7 cm. No evidence of active contrast extravasation. This does not change in density between phases. Thoracolumbar spondylosis. No acute osseous abnormality. IMPRESSION: VASCULAR Hematoma in the right gluteal subcutaneous fat. No evidence of active bleeding. No acute aortic syndrome.  Aortic Atherosclerosis (ICD10-I70.0). 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 NON-VASCULAR No acute abnormality in the abdomen or pelvis. Bilateral nonobstructing nephrolithiasis measuring up to 1.0 cm on the right. Hepatic steatosis. Electronically Signed   By: Minerva Fester M.D.   On: 01/22/2022 03:51   DG Chest 1 View  Result Date: 01/21/2022 CLINICAL DATA:  Loss of consciousness and low oxygen saturation; history of COPD EXAM: CHEST  1 VIEW COMPARISON:  Radiograph 11/08/2018 FINDINGS: Stable cardiomegaly.  Sternotomy and CABG. Increased interstitial coarsening compared with 11/07/2021. Bibasilar atelectasis. Question trace bilateral pleural effusions. No pneumothorax. No acute osseous abnormality. IMPRESSION: Cardiomegaly with possible mild interstitial edema. Question trace bilateral pleural effusions. Electronically Signed   By: Minerva Fester M.D.   On: 01/21/2022 20:38     PROCEDURES:  Critical Care performed: Yes, see critical care procedure note(s)  CRITICAL CARE Performed by: Irean Hong   Total critical care time: 30 minutes  Critical care time was exclusive of separately billable  procedures and treating other patients.  Critical care was necessary to treat or prevent imminent or life-threatening deterioration.  Critical care was time spent personally by me on the following activities: development of treatment plan with patient and/or surrogate as well as nursing, discussions with  consultants, evaluation of patient's response to treatment, examination of patient, obtaining history from patient or surrogate, ordering and performing treatments and interventions, ordering and review of laboratory studies, ordering and review of radiographic studies, pulse oximetry and re-evaluation of patient's condition.   Marland Kitchen1-3 Lead EKG Interpretation  Performed by: Irean Hong, MD Authorized by: Irean Hong, MD     Interpretation: normal     ECG rate:  85   ECG rate assessment: normal     Rhythm: sinus rhythm     Ectopy: none     Conduction: normal   Comments:     Patient placed on cardiac monitor to evaluate for arrhythmias    MEDICATIONS ORDERED IN ED: Medications  metoprolol tartrate (LOPRESSOR) tablet 12.5 mg (has no administration in time range)  nitroGLYCERIN (NITROSTAT) SL tablet 0.4 mg (has no administration in time range)  rosuvastatin (CRESTOR) tablet 10 mg (has no administration in time range)  DULoxetine (CYMBALTA) DR capsule 30 mg (has no administration in time range)  DULoxetine (CYMBALTA) DR capsule 60 mg (has no administration in time range)  tamsulosin (FLOMAX) capsule 0.8 mg (has no administration in time range)  pantoprazole (PROTONIX) EC tablet 40 mg (has no administration in time range)  albuterol (PROVENTIL) (2.5 MG/3ML) 0.083% nebulizer solution 2.5 mg (has no administration in time range)  ipratropium-albuterol (DUONEB) 0.5-2.5 (3) MG/3ML nebulizer solution 3 mL (has no administration in time range)  acetaminophen (TYLENOL) tablet 650 mg (has no administration in time range)    Or  acetaminophen (TYLENOL) suppository 650 mg (has no administration in  time range)  insulin aspart (novoLOG) injection 0-15 Units (has no administration in time range)  insulin aspart (novoLOG) injection 0-5 Units (has no administration in time range)  morphine (PF) 2 MG/ML injection 2 mg (has no administration in time range)  HYDROcodone-acetaminophen (NORCO/VICODIN) 5-325 MG per tablet 1-2 tablet (has no administration in time range)  iohexol (OMNIPAQUE) 350 MG/ML injection 100 mL (100 mLs Intravenous Contrast Given 01/22/22 0311)     IMPRESSION / MDM / ASSESSMENT AND PLAN / ED COURSE  I reviewed the triage vital signs and the nursing notes.                             66 year old male sent from urgent care for further evaluation of large right buttock hematoma, on warfarin for vertebral basilar disorder causing recurrent syncope.  Differential diagnosis includes but is not limited to hematoma, active extravasation, musculoskeletal contusion, etc.  I have personally reviewed patient's records and note the urgent care visit from yesterday.  Patient's presentation is most consistent with acute presentation with potential threat to life or bodily function.  The patient is on the cardiac monitor to evaluate for evidence of arrhythmia and/or significant heart rate changes.  Laboratory results demonstrate H/H 7.8/25.7 which is decreased from 9.7/32.32 months ago.  INR is 2.2.  Will obtain type and screen, CTA area to evaluate for active extravasation.  Clinical Course as of 01/22/22 0553  Thu Jan 22, 2022  3474 CTA demonstrates hematoma without active extravasation.  Will consult hospitalist services for evaluation and admission. [JS]    Clinical Course User Index [JS] Irean Hong, MD     FINAL CLINICAL IMPRESSION(S) / ED DIAGNOSES   Final diagnoses:  Anemia, unspecified type  Hematoma  Syncope, unspecified syncope type     Rx / DC Orders   ED Discharge Orders     None  Note:  This document was prepared using Dragon voice recognition  software and may include unintentional dictation errors.   Irean Hong, MD 01/22/22 [phone]

## 2022-01-22 NOTE — Progress Notes (Signed)
Patient seen and examined personally, I reviewed the chart, history and physical and admission note, done by admitting physician this morning and agree with the same with following addendum.  Please refer to the morning admission note for more detailed plan of care.  Briefly,  66 y.o. 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 CXR-cardiomegaly with mild interstitial edema CTA abdomen and pelvis: Hematoma in the right gluteal subcutaneous fat. No evidence ofactive bleeding. Ectasia of the left common iliac artery measuring 1.7 cm.No acute abnormality in the abdomen or pelvis.Bilateral nonobstructing nephrolithiasis measuring up to 1.0 cm on the right.Hepatic steatosis. Patient admitted for acute blood loss anemia with traumatic hematoma of the buttock   Seen this morning in the ED on the bedside recliner.  On 4 L nasal cannula saturating 98% alert awake oriented interactive.  Chronic shortness of breath dyspnea with minimal activity Extensive lymphedema/hyperkeratotic changes edematous lower leg Moving air well bilaterally with diminished breath sounds at bases  issues Traumatic hematoma of buttock Acute blood loss anemia: Baseline hemoglobin 9.7> downtrending-type and screen done.  I discussed with him a possible transfusion.  He would like to wait until < 7 gm. Recheck h/h and do q8hr. Recent Labs  Lab 01/21/22 2005 01/22/22 0541  HGB 7.8* 7.3*   HCT 25.7*  --     History of PE 2011 on Coumadin Antiphospholipid antibody syndrome  INR 2.2.  At risk of getting if INR subtherapeutic, recently worse.  Discussed with him in detail we agreed to hold coumadin and if INR < 2 plan to start heparin drip while monitoring his hemoglobin hematoma. INR ordered for today.  Dr. Tonna Boehringer from surgery was consulted due to large hematoma> advised continued on sedation, transfusion support as needed. Recent Labs  Lab 01/21/22 2005  INR 2.2*   Vertebrobasilar insufficiency Recurrent syncope, longstanding history with previous workup and follow up: He has had previous multiple MRI neck, indurated, bleeding, fecal 2020 EF 55 to 60% G1 DD. mild to moderate aortic valve stenosis continue fall precaution and encouraged f/u with his primary cardio  Diabetes mellitus: Continue sliding scale  CAD with history of PCI stent, CABG X3 Hypertension: No chest pain. Cont asa, metoprolol, statin,.  COPD with chronic respiratory failure with hypoxia and hypercapnea on 4l [employer] Trlogy: Not in exacerbation-resume home Trelegy, continue nebulizer. He isese home trilogy bipap- ordered bedtime bipap  Medrec pending.

## 2022-01-22 NOTE — ED Notes (Signed)
Pt declined a stretcher or hospital bed - pt provided a monitor.

## 2022-01-22 NOTE — Hospital Course (Addendum)
66 y.o. 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 CXR-cardiomegaly with mild interstitial edema CTA abdomen and pelvis: Hematoma in the right gluteal subcutaneous fat. No evidence ofactive bleeding. Ectasia of the left common iliac artery measuring 1.7 cm.No acute abnormality in the abdomen or pelvis.Bilateral nonobstructing nephrolithiasis measuring up to 1.0 cm on the right.Hepatic steatosis. Patient admitted for acute blood loss anemia with traumatic hematoma of the buttock

## 2022-01-22 NOTE — Assessment & Plan Note (Signed)
Sliding scale insulin coverage 

## 2022-01-22 NOTE — Consult Note (Signed)
Subjective:   CC: Left gluteal hematoma  HPI:  Jose [name] is a 66 y.o. male who was consulted by Kc for evaluation of above.  First noted 2 days ago.  Symptoms include: Pain is dull, localized.  Exacerbated by touch.  Alleviated by nothing specific.  Associated with nothing specific.  Hx of mult falls, on coumadin.  Workup with several specialists throughout years, unsure if he was ever offered alternatives to Coumadin.   Past Medical History:  has a past medical history of Antiphospholipid antibody syndrome [facility]), CAD S/P percutaneous coronary angioplasty ('98, '04, Feb 2017), Carotid arterial disease (HCC) (10/2015), COPD (chronic obstructive pulmonary disease) (HCC), Coronary artery disease, Hypertension, Non-insulin treated type 2 diabetes mellitus (HCC), Pulmonary emboli (HCC) (2011), RBBB, and Sleep apnea.  Past Surgical History:  has a past surgical history that includes Coronary angioplasty with stent ('98, '04, Feb 2017); Back surgery ('89, '06); Colonoscopy; Appendectomy; I & D extremity (Left, 03/15/2019); Incision and drainage of wound (Left, 03/18/2019); and Application if wound vac (Left, 03/18/2019).  Family History: family history includes CAD (age of onset: 31) in his father.  Social History:  reports that he quit smoking about 3 years ago. His smoking use included cigarettes. He has a 20.00 pack-year smoking history. He has never used smokeless tobacco. He reports that he does not drink alcohol and does not use drugs.  Current Medications:  Prior to Admission medications   Medication Sig Start Date End Date Taking? Authorizing Provider  acetaminophen (TYLENOL) 650 MG CR tablet Take 1,950 mg by mouth every 8 (eight) hours as needed for pain.    [provider]  albuterol (PROVENTIL) (5 MG/ML) 0.5% nebulizer solution Take 0.5 mLs (2.5 mg total) by nebulization every 6 (six) hours as needed for wheezing or shortness of breath. 12/27/18   Terald Sleeper, MD  albuterol  (VENTOLIN HFA) 108 (90 Base) MCG/ACT inhaler Inhale 2 puffs into the lungs every 6 (six) hours as needed for wheezing or shortness of breath.    [provider]  aspirin EC 81 MG EC tablet Take 1 tablet (81 mg total) by mouth daily. Swallow whole. 01/23/20   Dagar, Geralynn Rile, MD  DULoxetine (CYMBALTA) 30 MG capsule Take 30 mg by mouth daily. Take one capsule (30 mg) daily along with 60 mg capsule to equal 90 mg daily 11/02/21   [provider]  DULoxetine (CYMBALTA) 60 MG capsule Take 60 mg by mouth daily. Take one capsule (60 mg) along with 30 mg capsule to equal 90 mg daily    [provider]  Fluticasone-Umeclidin-Vilant (TRELEGY ELLIPTA) 200-62.5-25 MCG/ACT AEPB Inhale 1 puff into the lungs daily.    [provider]  gabapentin (NEURONTIN) 600 MG tablet Take 600 mg by mouth 3 (three) times daily.    [provider]  glimepiride (AMARYL) 4 MG tablet Take 4 mg by mouth 2 (two) times daily. 12/04/20   [provider]  ipratropium-albuterol (DUONEB) 0.5-2.5 (3) MG/3ML SOLN Take 3 mLs by nebulization every 4 (four) hours as needed (For shortness of breath or wheezing).    [provider]  metoprolol tartrate (LOPRESSOR) 12.5 mg TABS tablet Take 12.5 mg by mouth 2 (two) times daily.    [provider]  nitroGLYCERIN (NITROSTAT) 0.4 MG SL tablet Place 0.4 mg under the tongue every 5 (five) minutes as needed for chest pain.  07/07/16   [provider]  OXYGEN Inhale 4-6 L into the lungs daily.    [provider]  pantoprazole (PROTONIX) 40 MG tablet Take 40 mg by mouth 2 (two) times daily.    [provider]  rosuvastatin (CRESTOR) 10 MG tablet Take 10 mg by mouth every evening. 07/07/16   [provider]  tamsulosin (FLOMAX) 0.4 MG CAPS capsule Take 0.8 mg by mouth daily.    [provider]  tiZANidine (ZANAFLEX) 4 MG tablet Take 4 mg by mouth 3 (three) times daily as needed for muscle spasms.  05/29/21   [provider]  topiramate (TOPAMAX) 50 MG tablet Take 75 mg by mouth 2 (two) times daily.    [provider]  torsemide (DEMADEX) 20 MG tablet Take 20 mg by mouth daily.    [provider]  warfarin (COUMADIN) 6 MG tablet Take 5 mg by mouth See admin instructions. Take one tablet (5 mg) Tuesday, Wednesday, Thursday, Saturday and Sunday Then take (6 mg) Monday and Friday    [provider]    Allergies:  Allergies  Allergen Reactions   Melatonin Anxiety   Altace [Ramipril] Cough    ROS:  General: Denies weight loss, weight gain, fatigue, fevers, chills, and night sweats. Eyes: Denies blurry vision, double vision, eye pain, itchy eyes, and tearing. Ears: Denies hearing loss, earache, and ringing in ears. Nose: Denies sinus pain, congestion, infections, runny nose, and nosebleeds. Mouth/throat: Denies hoarseness, sore throat, bleeding gums, and difficulty swallowing. Heart: Denies chest pain, palpitations, racing heart, irregular heartbeat, leg pain or swelling, and decreased activity tolerance. Respiratory: Denies breathing difficulty, shortness of breath, wheezing, cough, and sputum. GI: Denies change in appetite, heartburn, nausea, vomiting, constipation, diarrhea, and blood in stool. GU: Denies difficulty urinating, pain with urinating, urgency, frequency, blood in urine. Musculoskeletal: Denies joint stiffness, pain, swelling, muscle weakness. Skin: Denies rash, itching, mass, tumors, sores, and boils Neurologic: Denies headache, fainting, dizziness, seizures, numbness, and tingling. Psychiatric: Denies depression, anxiety, difficulty sleeping, and memory loss. Endocrine: Denies heat or cold intolerance, and increased thirst or urination. Blood/lymph: Denies easy bruising, easy bruising, and swollen glands     Objective:     BP (!) 125/56 (BP Location: Left Arm)   Pulse 83   Temp 98.6 F (37 C) (Oral)   Resp 19   Ht 5\' 8"   (1.727 m)   Wt 108.9 kg   SpO2 100%   BMI 36.49 kg/m   Constitutional :  alert, cooperative, appears stated age, and no distress  Lymphatics/Throat:  no asymmetry, masses, or scars  Respiratory:  clear to auscultation bilaterally  Cardiovascular:  regular rate and rhythm  Gastrointestinal: soft, non-tender; bowel sounds normal; no masses,  no organomegaly.  Musculoskeletal: Steady gait and movement  Skin: Cool and moist, left gluteal hematoma, tender to palpation and firm no sign of overlying skin necrosis.  No sign of discharge or expanding erythema to indicate active infection.  Psychiatric: Normal affect, non-agitated, not confused       LABS:     Latest Ref Rng & Units 01/21/2022    8:05 PM 11/11/2021    1:43 AM 11/09/2021    1:12 AM  CMP  Glucose 70 - 99 mg/dL 82  11/11/2021  94   BUN 8 - 23 mg/dL 27  44  30   Creatinine 0.61 - 1.24 mg/dL 342  8.76  8.11   Sodium 135 - 145 mmol/L 142  141  140   Potassium 3.5 - 5.1 mmol/L 3.5  4.2  4.3   Chloride 98 - 111 mmol/L 96  98  98  CO2 22 - 32 mmol/L 38  35  35   Calcium 8.9 - 10.3 mg/dL 8.8  8.9  8.6       Latest Ref Rng & Units 01/22/2022    5:41 AM 01/21/2022    8:05 PM 11/11/2021    1:43 AM  CBC  WBC 4.0 - 10.5 K/uL  12.0  11.4   Hemoglobin 13.0 - 17.0 g/dL 7.3  7.8  9.7   Hematocrit 39.0 - 52.0 %  25.7  32.3   Platelets 150 - 400 K/uL  232  357     RADS: CLINICAL DATA:  Hematoma in right buttock on Coumadin   EXAM: CTA ABDOMEN AND PELVIS WITHOUT AND WITH CONTRAST   TECHNIQUE: Multidetector CT imaging of the abdomen and pelvis was performed using the standard protocol during bolus administration of intravenous contrast. Multiplanar reconstructed images and MIPs were obtained and reviewed to evaluate the vascular anatomy.   RADIATION DOSE REDUCTION: This exam was performed according to the departmental dose-optimization program which includes automated exposure control, adjustment of the mA and/or kV according  to patient size and/or use of iterative reconstruction technique.   CONTRAST:  OMNIPAQUE IOHEXOL 350 MG/ML SOLN   COMPARISON:  CT abdomen and pelvis 01/20/2020   FINDINGS: VASCULAR   Aorta: Calcified athero sclerotic plaque without significant narrowing. No aneurysm or dissection.   Celiac: Atherosclerotic plaque at the origin causes moderate narrowing. No aneurysm or dissection. Normal variant origin of the left gastric directly from the aorta.   SMA: Patent without evidence of aneurysm, dissection, vasculitis or significant stenosis.   Renals: Both renal arteries are patent without evidence of aneurysm, dissection, vasculitis, fibromuscular dysplasia or significant stenosis.   IMA: Patent without evidence of aneurysm, dissection, vasculitis or significant stenosis.   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 Calcified athero sclerotic plaque in both common, internal, and external iliac arteries. No dissection.   Proximal Outflow: Bilateral common femoral and visualized portions of the superficial and profunda femoral arteries are patent without evidence of aneurysm, dissection, vasculitis or significant stenosis.   Veins: Unremarkable.   Review of the MIP images confirms the above findings.   NON-VASCULAR   Lower chest: Bibasilar scarring/atelectasis.  No acute abnormality.   Hepatobiliary: Hepatic steatosis. No focal liver abnormality is seen. No gallstones, gallbladder wall thickening, or biliary dilatation.   Pancreas: Unremarkable. No pancreatic ductal dilatation or surrounding inflammatory changes.   Spleen: Normal in size without focal abnormality.   Adrenals/Urinary Tract: Unremarkable adrenal glands. Bilateral nephrolithiasis measuring up to 1.0 cm right. No hydronephrosis or obstructing urinary calculi. Unremarkable bladder.   Stomach/Bowel: Normal caliber large and small bowel. Moderate colonic stool burden.  Unremarkable stomach.   Lymphatic: Unchanged bilateral external iliac lymphadenopathy measuring up to 1.4 cm on the right.   Reproductive: Unremarkable.   Other: No free intraperitoneal fluid or air.   Musculoskeletal: Edema or contusion about the bilateral posterior gluteal subcutaneous fat. Within the right gluteal subcutaneous fat there is a hyperdense fluid collection measuring approximately 9.3 by 3.7 cm. No evidence of active contrast extravasation. This does not change in density between phases. Thoracolumbar spondylosis. No acute osseous abnormality.   IMPRESSION: VASCULAR   Hematoma in the right gluteal subcutaneous fat. No evidence of active bleeding.   No acute aortic syndrome.  Aortic Atherosclerosis (ICD10-I70.0).   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   NON-VASCULAR   No acute abnormality in the abdomen or pelvis.  Bilateral nonobstructing nephrolithiasis measuring up to 1.0 cm on the right.   Hepatic steatosis.     Electronically Signed   By: Minerva Fester M.D.   On: 01/22/2022 03:51    Assessment:    Left gluteal hematoma secondary to Coumadin use and frequent falls.  No sign of hematoma expansion and no sign of active bleeding noted on CT.  No evidence of overlying skin necrosis or infection at this time either.   Plan:     Continued observation for now.  Will only consider surgical intervention if this gets infected or overlying skin necrosis occurs.  High risk of perioperative complication secondary to anticoagulation and also history of antiphospholipid syndrome.  Strongly encourage patient discuss with Coumadin prescriber to see if alternatives available for anticoagulation due to high risk history of falls and recurrent bleeding episodes secondary to the falls.  Agree with another hemoglobin check to ensure no continued downtrending.  Transfuse as necessary, keeping in mind history of antiphospholipid syndrome.  The  patient verbalized understanding and all questions were answered to the patient's satisfaction.  Recommended follow-up for reexamination of the area in a couple days if still in-house.  If patient is discharged prior recommend outpatient follow-up in the office within the next several days to continue to monitor area.  Warm compress in the meantime to facilitate reabsorption.  labs/images/medications/previous chart entries reviewed personally and relevant changes/updates noted above.

## 2022-01-22 NOTE — Assessment & Plan Note (Signed)
Continue metoprolol. 

## 2022-01-23 ENCOUNTER — Inpatient Hospital Stay: Payer: Medicare Other

## 2022-01-23 DIAGNOSIS — D6861 Antiphospholipid syndrome: Secondary | ICD-10-CM | POA: Diagnosis not present

## 2022-01-23 DIAGNOSIS — S300XXA Contusion of lower back and pelvis, initial encounter: Secondary | ICD-10-CM | POA: Diagnosis not present

## 2022-01-23 LAB — BASIC METABOLIC PANEL
Anion gap: 6 (ref 5–15)
BUN: 27 mg/dL — ABNORMAL HIGH (ref 8–23)
CO2: 41 mmol/L — ABNORMAL HIGH (ref 22–32)
Calcium: 8.4 mg/dL — ABNORMAL LOW (ref 8.9–10.3)
Chloride: 96 mmol/L — ABNORMAL LOW (ref 98–111)
Creatinine, Ser: 1.07 mg/dL (ref 0.61–1.24)
GFR, Estimated: >60 mL/min (ref >60)
Glucose, Bld: 164 mg/dL — ABNORMAL HIGH (ref 70–99)
Potassium: 3.7 mmol/L (ref 3.5–5.1)
Sodium: 143 mmol/L (ref 135–145)

## 2022-01-23 LAB — HEPARIN LEVEL (UNFRACTIONATED): Heparin Unfractionated: <0.1 IU/mL — ABNORMAL LOW (ref 0.30–0.70)

## 2022-01-23 LAB — CBC
HCT: 23.8 % — ABNORMAL LOW (ref 39.0–52.0)
Hemoglobin: 7.1 g/dL — ABNORMAL LOW (ref 13.0–17.0)
MCH: 28.2 pg (ref 26.0–34.0)
MCHC: 29.8 g/dL — ABNORMAL LOW (ref 30.0–36.0)
MCV: 94.4 fL (ref 80.0–100.0)
Platelets: 215 10*3/uL (ref 150–400)
RBC: 2.52 MIL/uL — ABNORMAL LOW (ref 4.22–5.81)
RDW: 16 % — ABNORMAL HIGH (ref 11.5–15.5)
WBC: 10.7 10*3/uL — ABNORMAL HIGH (ref 4.0–10.5)
nRBC: 0 % (ref 0.0–0.2)

## 2022-01-23 LAB — GLUCOSE, CAPILLARY
Glucose-Capillary: 142 mg/dL — ABNORMAL HIGH (ref 70–99)
Glucose-Capillary: 123 mg/dL — ABNORMAL HIGH (ref 70–99)
Glucose-Capillary: 131 mg/dL — ABNORMAL HIGH (ref 70–99)
Glucose-Capillary: 117 mg/dL — ABNORMAL HIGH (ref 70–99)

## 2022-01-23 LAB — HEMOGLOBIN AND HEMATOCRIT, BLOOD
HCT: 24.2 % — ABNORMAL LOW (ref 39.0–52.0)
Hemoglobin: 7.1 g/dL — ABNORMAL LOW (ref 13.0–17.0)

## 2022-01-23 LAB — APTT: aPTT: 46 seconds — ABNORMAL HIGH (ref 24–36)

## 2022-01-23 LAB — PROTIME-INR
INR: 1.9 — ABNORMAL HIGH (ref 0.8–1.2)
INR: 1.6 — ABNORMAL HIGH (ref 0.8–1.2)
Prothrombin Time: 21.6 seconds — ABNORMAL HIGH (ref 11.4–15.2)
Prothrombin Time: 19 seconds — ABNORMAL HIGH (ref 11.4–15.2)

## 2022-01-23 MED ORDER — HEPARIN (PORCINE) 25000 UT/250ML-% IV SOLN
2150.0000 [IU]/h | INTRAVENOUS | Status: DC
  Administered 2022-01-23: 1550 [IU]/h via INTRAVENOUS
  Administered 2022-01-24: 1750 [IU]/h via INTRAVENOUS
  Administered 2022-01-24: 1950 [IU]/h via INTRAVENOUS
  Filled 2022-01-23 (×3): qty 250

## 2022-01-23 MED ORDER — HEPARIN BOLUS VIA INFUSION
5600.0000 [IU] | Freq: Once | INTRAVENOUS | Status: AC
  Administered 2022-01-23: 5600 [IU] via INTRAVENOUS
  Filled 2022-01-23: qty 5600

## 2022-01-23 NOTE — Consult Note (Signed)
[facility]  [phone] [phone]  ID: Jose [name] OB: 12-22-1955  MR#: [id number]  CMK#:[id number]  Patient Care Team: [name], MD as PCP - General (Internal Medicine)  CHIEF COMPLAINT: Reported history of antiphospholipid syndrome now with syncope and hematoma.  INTERVAL HISTORY: Patient is a 66 year old male who was reportedly diagnosed with antiphospholipid syndrome greater than 10 years ago and has been on chronic anticoagulation ever since.  Prior to that, patient had no significant history of blood clotting.  He does not report any thrombosis or blood clotting since initiating Coumadin.  Currently has buttock pain, but otherwise feels well.  He has no current neurologic complaints.  He denies any recent fevers or illnesses.  He has a good appetite and denies weight loss.  He has no chest pain, shortness of breath, cough, or hemoptysis.  He denies any nausea, vomiting, constipation or diarrhea.  He has no urinary complaints.  Patient offers no further specific complaints today.  REVIEW OF SYSTEMS:   Review of Systems  Constitutional: Negative.  Negative for fever, malaise/fatigue and weight loss.  Respiratory: Negative.  Negative for cough, hemoptysis and shortness of breath.   Cardiovascular: Negative.  Negative for chest pain and leg swelling.  Gastrointestinal: Negative.  Negative for abdominal pain.  Genitourinary: Negative.  Negative for dysuria.  Musculoskeletal: Negative.  Negative for back pain.  Skin: Negative.  Negative for rash.  Neurological: Negative.  Negative for dizziness, focal weakness, weakness and headaches.  Endo/Heme/Allergies:  Bruises/bleeds easily.  Psychiatric/Behavioral: Negative.  The patient is not nervous/anxious.     As per HPI. Otherwise, a complete review of systems is negative.  PAST MEDICAL HISTORY: Past Medical History:  Diagnosis Date   Antiphospholipid antibody syndrome (HCC)    CAD S/P  percutaneous coronary angioplasty '98, '04, Feb 2017   [facility]   Carotid arterial disease (HCC) 10/2015   moderate bilateral   COPD (chronic obstructive pulmonary disease) (HCC)    Coronary artery disease    Hypertension    Non-insulin treated type 2 diabetes mellitus (HCC)    Pulmonary emboli (HCC) 2011   RBBB    Sleep apnea     PAST SURGICAL HISTORY: Past Surgical History:  Procedure Laterality Date   APPENDECTOMY     APPLICATION OF WOUND VAC Left 03/18/2019   Procedure: Application Of Wound Vac;  Surgeon: Nadara Mustard, MD;  Location: [facility] OR;  Service: Orthopedics;  Laterality: Left;   BACK SURGERY  '89, '06   COLONOSCOPY     CORONARY ANGIOPLASTY WITH STENT PLACEMENT  '98, '04, Feb 2017   I & D EXTREMITY Left 03/15/2019   Procedure: DEBRIDEMENT LEFT THIGH;  Surgeon: Nadara Mustard, MD;  Location: [facility] OR;  Service: Orthopedics;  Laterality: Left;   INCISION AND DRAINAGE OF WOUND Left 03/18/2019   Procedure: LEFT THIGH DEBRIDEMENT;  Surgeon: Nadara Mustard, MD;  Location: [facility] OR;  Service: Orthopedics;  Laterality: Left;    FAMILY HISTORY: Family History  Problem Relation Age of Onset   CAD Father 80    ADVANCED DIRECTIVES (Y/N):  @ADVDIR @  HEALTH MAINTENANCE: Social History   Tobacco Use   Smoking status: Former    Packs/day: 0.50    Years: 40.00    Total pack years: 20.00    Types: Cigarettes    Quit date: 01/15/2019    Years since quitting: 3.0   Smokeless tobacco: Never   Tobacco comments:    has started smoking again, willing to quit.  Vaping Use   Vaping Use: Never used  Substance Use Topics   Alcohol use: No   Drug use: No     Colonoscopy:  PAP:  Bone density:  Lipid panel:  Allergies  Allergen Reactions   Melatonin Anxiety   Altace [Ramipril] Cough    Current Facility-Administered Medications  Medication Dose Route Frequency Provider Last Rate Last Admin   acetaminophen (TYLENOL) tablet 650 mg  650 mg Oral Q6H PRN Andris Baumann, MD   650 mg at  01/23/22 0135   Or   acetaminophen (TYLENOL) suppository 650 mg  650 mg Rectal Q6H PRN Andris Baumann, MD       albuterol (PROVENTIL) (2.5 MG/3ML) 0.083% nebulizer solution 2.5 mg  2.5 mg Inhalation Q6H PRN Andris Baumann, MD       aspirin EC tablet 81 mg  81 mg Oral Daily Kc, Ramesh, MD   81 mg at 01/23/22 0854   DULoxetine (CYMBALTA) DR capsule 30 mg  30 mg Oral Daily Lindajo Royal V, MD   30 mg at 01/22/22 1120   DULoxetine (CYMBALTA) DR capsule 60 mg  60 mg Oral Daily Lindajo Royal V, MD   60 mg at 01/22/22 1123   fluticasone furoate-vilanterol (BREO ELLIPTA) 200-25 MCG/ACT 1 puff  1 puff Inhalation Daily Andris Baumann, MD   1 puff at 01/23/22 0217   And   umeclidinium bromide (INCRUSE ELLIPTA) 62.5 MCG/ACT 1 puff  1 puff Inhalation Daily Lindajo Royal V, MD   1 puff at 01/23/22 0217   gabapentin (NEURONTIN) tablet 600 mg  600 mg Oral TID Lanae Boast, MD   600 mg at 01/23/22 1647   HYDROcodone-acetaminophen (NORCO/VICODIN) 5-325 MG per tablet 1-2 tablet  1-2 tablet Oral Q4H PRN Andris Baumann, MD   2 tablet at 01/23/22 1322   insulin aspart (novoLOG) injection 0-15 Units  0-15 Units Subcutaneous TID WC Lindajo Royal V, MD   2 Units at 01/23/22 1253   insulin aspart (novoLOG) injection 0-5 Units  0-5 Units Subcutaneous QHS Lindajo Royal V, MD       ipratropium-albuterol (DUONEB) 0.5-2.5 (3) MG/3ML nebulizer solution 3 mL  3 mL Nebulization Q4H PRN Andris Baumann, MD   3 mL at 01/22/22 2152   metoprolol tartrate (LOPRESSOR) tablet 12.5 mg  12.5 mg Oral BID Lindajo Royal V, MD   12.5 mg at 01/23/22 0854   morphine (PF) 2 MG/ML injection 2 mg  2 mg Intravenous Q2H PRN Andris Baumann, MD       nitroGLYCERIN (NITROSTAT) SL tablet 0.4 mg  0.4 mg Sublingual Q5 min PRN Andris Baumann, MD       pantoprazole (PROTONIX) EC tablet 40 mg  40 mg Oral BID Lindajo Royal V, MD   40 mg at 01/23/22 0854   rosuvastatin (CRESTOR) tablet 10 mg  10 mg Oral QPM Lindajo Royal V, MD   10 mg at 01/22/22 1823    sodium chloride (OCEAN) 0.65 % nasal spray 1 spray  1 spray Each Nare PRN Andris Baumann, MD   1 spray at 01/22/22 2218   tamsulosin (FLOMAX) capsule 0.8 mg  0.8 mg Oral Daily Lindajo Royal V, MD   0.8 mg at 01/23/22 0854   topiramate (TOPAMAX) tablet 75 mg  75 mg Oral TID Lanae Boast, MD   75 mg at 01/23/22 0858   torsemide (DEMADEX) tablet 20 mg  20 mg Oral Daily Lanae Boast, MD   20 mg at 01/23/22 [phone]  OBJECTIVE: Vitals:   01/23/22 0815 01/23/22 1528  BP: 135/69 (!) 114/56  Pulse: 79 77  Resp: 20 19  Temp: 98.6 F (37 C) 98.4 F (36.9 C)  SpO2: 99% 100%     Body mass index is 37.44 kg/m.    ECOG FS:0 - Asymptomatic  General: Well-developed, well-nourished, no acute distress. Eyes: Pink conjunctiva, anicteric sclera. HEENT: Normocephalic, moist mucous membranes. Lungs: No audible wheezing or coughing. Heart: Regular rate and rhythm. Abdomen: Soft, nontender, no obvious distention. Musculoskeletal: No edema, cyanosis, or clubbing. Neuro: Alert, answering all questions appropriately. Cranial nerves grossly intact. Skin: No rashes or petechiae noted. Psych: Normal affect. Lymphatics: No cervical, calvicular, axillary or inguinal LAD.   LAB RESULTS:  Lab Results  Component Value Date   NA 143 01/23/2022   K 3.7 01/23/2022   CL 96 (L) 01/23/2022   CO2 41 (H) 01/23/2022   GLUCOSE 164 (H) 01/23/2022   BUN 27 (H) 01/23/2022   CREATININE 1.07 01/23/2022   CALCIUM 8.4 (L) 01/23/2022   PROT 7.8 11/05/2021   ALBUMIN 3.3 (L) 11/05/2021   AST 17 11/05/2021   ALT 22 11/05/2021   ALKPHOS 70 11/05/2021   BILITOT 0.3 11/05/2021   GFRNONAA >60 01/23/2022   GFRAA >60 03/22/2019    Lab Results  Component Value Date   WBC 10.7 (H) 01/23/2022   NEUTROABS 9.6 (H) 11/07/2021   HGB 7.1 (L) 01/23/2022   HCT 23.8 (L) 01/23/2022   MCV 94.4 01/23/2022   PLT 215 01/23/2022     STUDIES: CT Angio Abd/Pel W and/or Wo Contrast  Result Date: 01/22/2022 CLINICAL DATA:  Hematoma in  right buttock on Coumadin EXAM: CTA ABDOMEN AND PELVIS WITHOUT AND WITH CONTRAST TECHNIQUE: Multidetector CT imaging of the abdomen and pelvis was performed using the standard protocol during bolus administration of intravenous contrast. Multiplanar reconstructed images and MIPs were obtained and reviewed to evaluate the vascular anatomy. RADIATION DOSE REDUCTION: This exam was performed according to the departmental dose-optimization program which includes automated exposure control, adjustment of the mA and/or kV according to patient size and/or use of iterative reconstruction technique. CONTRAST:  OMNIPAQUE IOHEXOL 350 MG/ML SOLN COMPARISON:  CT abdomen and pelvis 01/20/2020 FINDINGS: VASCULAR Aorta: Calcified athero sclerotic plaque without significant narrowing. No aneurysm or dissection. Celiac: Atherosclerotic plaque at the origin causes moderate narrowing. No aneurysm or dissection. Normal variant origin of the left gastric directly from the aorta. SMA: Patent without evidence of aneurysm, dissection, vasculitis or significant stenosis. Renals: Both renal arteries are patent without evidence of aneurysm, dissection, vasculitis, fibromuscular dysplasia or significant stenosis. IMA: Patent without evidence of aneurysm, dissection, vasculitis or significant stenosis. 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 Calcified athero sclerotic plaque in both common, internal, and external iliac arteries. No dissection. Proximal Outflow: Bilateral common femoral and visualized portions of the superficial and profunda femoral arteries are patent without evidence of aneurysm, dissection, vasculitis or significant stenosis. Veins: Unremarkable. Review of the MIP images confirms the above findings. NON-VASCULAR Lower chest: Bibasilar scarring/atelectasis.  No acute abnormality. Hepatobiliary: Hepatic steatosis. No focal liver abnormality is seen. No gallstones, gallbladder wall thickening, or  biliary dilatation. Pancreas: Unremarkable. No pancreatic ductal dilatation or surrounding inflammatory changes. Spleen: Normal in size without focal abnormality. Adrenals/Urinary Tract: Unremarkable adrenal glands. Bilateral nephrolithiasis measuring up to 1.0 cm right. No hydronephrosis or obstructing urinary calculi. Unremarkable bladder. Stomach/Bowel: Normal caliber large and small bowel. Moderate colonic stool burden. Unremarkable stomach. Lymphatic: Unchanged bilateral external iliac  lymphadenopathy measuring up to 1.4 cm on the right. Reproductive: Unremarkable. Other: No free intraperitoneal fluid or air. Musculoskeletal: Edema or contusion about the bilateral posterior gluteal subcutaneous fat. Within the right gluteal subcutaneous fat there is a hyperdense fluid collection measuring approximately 9.3 by 3.7 cm. No evidence of active contrast extravasation. This does not change in density between phases. Thoracolumbar spondylosis. No acute osseous abnormality. IMPRESSION: VASCULAR Hematoma in the right gluteal subcutaneous fat. No evidence of active bleeding. No acute aortic syndrome.  Aortic Atherosclerosis (ICD10-I70.0). 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 NON-VASCULAR No acute abnormality in the abdomen or pelvis. Bilateral nonobstructing nephrolithiasis measuring up to 1.0 cm on the right. Hepatic steatosis. Electronically Signed   By: Minerva Fester M.D.   On: 01/22/2022 03:51   DG Chest 1 View  Result Date: 01/21/2022 CLINICAL DATA:  Loss of consciousness and low oxygen saturation; history of COPD EXAM: CHEST  1 VIEW COMPARISON:  Radiograph 11/08/2018 FINDINGS: Stable cardiomegaly.  Sternotomy and CABG. Increased interstitial coarsening compared with 11/07/2021. Bibasilar atelectasis. Question trace bilateral pleural effusions. No pneumothorax. No acute osseous abnormality. IMPRESSION: Cardiomegaly with possible mild interstitial edema. Question trace bilateral pleural  effusions. Electronically Signed   By: Minerva Fester M.D.   On: 01/21/2022 20:38    ASSESSMENT: Reported history of antiphospholipid syndrome now with syncope and hematoma.  PLAN:    Possible antiphospholipid syndrome: Patient reports he was diagnosed greater than 10 years ago at [facility].  He did not have a "catastrophic" blood clot at that time, arterial clot, nor did he have a history of blood clotting until that episode.  He reports he injured his ankle and developed a DVT and pulmonary embolism thereafter.  He also reports he has not seen a hematologist since that time.  Given his reported history, it is unclear that he actually has antiphospholipid syndrome.  I have reordered blood tests today to confirm diagnosis.  Since his history is unclear, would recommend bridging patient with heparin once labs are drawn and restart Coumadin when appropriate.  Goal INR is 2.0-3.0.  Although Coumadin is still the recommended treatment for APS, patients with lower risk disease such as history of a single thrombosis or low risk profile may be able to be placed on a DOAC. Anemia: Maintain hemoglobin greater than 7.0.  Appreciate consult, will follow.  Jeralyn Ruths, MD   01/23/2022 4:57 PM

## 2022-01-23 NOTE — Progress Notes (Signed)
ANTICOAGULATION CONSULT NOTE - Initial Consult  Pharmacy Consult for Heparin Indication: pulmonary embolus  Allergies  Allergen Reactions   Melatonin Anxiety   Altace [Ramipril] Cough    Patient Measurements: Height: 5\' 8"  (172.7 cm) Weight: 111.7 kg (246 lb 4.1 oz) IBW/kg (Calculated) : 68.4 Heparin Dosing Weight: 93.4 kg  Vital Signs: Temp: 98.4 F (36.9 C) (11/10 1528) Temp Source: Oral (11/10 1528) BP: 114/56 (11/10 1528) Pulse Rate: 77 (11/10 1528)  Labs: Recent Labs    01/21/22 2005 01/22/22 0016 01/22/22 0541 01/22/22 1524 01/22/22 2241 01/23/22 0326  HGB 7.8*  --    < > 7.7* 7.8* 7.1*  HCT 25.7*  --   --  25.8* 26.5* 23.8*  PLT 232  --   --   --   --  215  LABPROT 24.2*  --   --  22.8*  --  21.6*  INR 2.2*  --   --  2.0*  --  1.9*  CREATININE 0.97  --   --   --   --  1.07  TROPONINIHS 11 12  --   --   --   --    < > = values in this interval not displayed.    Estimated Creatinine Clearance: 82.3 mL/min (by C-G formula based on SCr of 1.07 mg/dL).   Medical History: Past Medical History:  Diagnosis Date   Antiphospholipid antibody syndrome [facility])    CAD S/P percutaneous coronary angioplasty '98, '04, Feb 2017   [facility]   Carotid arterial disease (HCC) 10/2015   moderate bilateral   COPD (chronic obstructive pulmonary disease) (HCC)    Coronary artery disease    Hypertension    Non-insulin treated type 2 diabetes mellitus (HCC)    Pulmonary emboli (HCC) 2011   RBBB    Sleep apnea     Medications:  Pt had PTA warfarin:  Last dose 01/20/22.  Assessment: Pharmacy consulted to dose heparin on 66 y.o. M admitted with antiphospholipid syndrome and hx of PE.  PTA warfarin held, INR now subtherapeutic (1.9).  Labs: Hgb 7.1, Hct 23.8, Plts 215, INR 1.9, baseline HL and aPTT ordered.  Goal of Therapy:  Heparin level 0.3-0.7 units/ml Monitor platelets by anticoagulation protocol: Yes   Plan:  Give 5600 units bolus x 1 Start heparin infusion at 1550  units/hr Check anti-Xa level in 6 hours and daily while on heparin Continue to monitor H&H and platelets  71 01/23/2022,5:51 PM

## 2022-01-23 NOTE — TOC Initial Note (Signed)
Transition of Care [facility]) - Initial/Assessment Note    Patient Details  Name: Jose [name] MRN: [id number] Date of Birth: 08-Nov-1955  Transition of Care [facility]) CM/SW Contact:    Candie Chroman, LCSW Phone Number: 01/23/2022, 11:22 AM  Clinical Narrative:  Readmission prevention screen complete. CSW met with patient. No supports at bedside. CSW introduced role and explained that discharge planning would be discussed. PCP is Georgina Peer, MD in [place]. Wife transports him to appointments. He uses the [facility] in [place]. No issues obtaining medications. Patient lives home with his wife. He was just discharged from [facility] on 11/4 but is interested in restarting services. [facility] liaison confirmed they can accept him back for PT, RN. Patient has oxygen Huey Romans), Inogen, NIV (Adapt), rollator, and med alert at home. No further concerns. CSW encouraged patient to contact CSW as needed. CSW will continue to follow patient for support and facilitate return home once stable. His wife will transport him home at discharge.                Expected Discharge Plan: [place] Barriers to Discharge: Continued Medical Work up   Patient Goals and CMS Choice     Choice offered to / list presented to : Patient  Expected Discharge Plan and Services Expected Discharge Plan: [place] Acute Care Choice: [place] arrangements for the past 2 months: [place] Arranged: RN, PT [facility] Agency: [place] ([place]) Date [place]: 01/23/22   Representative spoke with at [place]: Floydene Flock  Prior Living Arrangements/Services Living arrangements for the past 2 months: [place] Lives with:: Spouse Patient language and need for interpreter reviewed:: Yes Do you feel safe going back to the place where you live?: Yes      Need for Family Participation in  Patient Care: Yes (Comment) Care giver support system in place?: Yes (comment) Current home services: DME Criminal Activity/Legal Involvement Pertinent to Current Situation/Hospitalization: No - Comment as needed  Activities of Daily Living Home Assistive Devices/Equipment: Eyeglasses ADL Screening (condition at time of admission) Patient's cognitive ability adequate to safely complete daily activities?: Yes Is the patient deaf or have difficulty hearing?: No Does the patient have difficulty seeing, even when wearing glasses/contacts?: No Does the patient have difficulty concentrating, remembering, or making decisions?: No Patient able to express need for assistance with ADLs?: Yes Does the patient have difficulty dressing or bathing?: No Independently performs ADLs?: No Communication: Independent Dressing (OT): Independent Grooming: Independent Feeding: Independent Bathing: Independent Toileting: Independent In/Out Bed: Needs assistance Is this a change from baseline?: Change from baseline, expected to last <3 days Walks in Home: Needs assistance Is this a change from baseline?: Change from baseline, expected to last <3 days Does the patient have difficulty walking or climbing stairs?: Yes Weakness of Legs: None Weakness of Arms/Hands: None  Permission Sought/Granted Permission sought to share information with : Facility Art therapist granted to share information with : Yes, Verbal Permission Granted     Permission granted to share info w AGENCY: [place]        Emotional Assessment Appearance:: Appears stated age Attitude/Demeanor/Rapport: Engaged, Gracious Affect (typically observed): Accepting, Appropriate, Calm, Pleasant Orientation: : Oriented to Self, Oriented to Place, Oriented  to  Time, Oriented to Situation Alcohol / Substance Use: Not Applicable Psych Involvement: No (comment)  Admission diagnosis:  Hematoma [T14.8XXA] Syncope,  unspecified syncope type [R55] Anemia, unspecified type [D64.9] ABLA (acute blood loss anemia) [D62] Patient Active Problem List   Diagnosis Date Noted   Traumatic hematoma of buttock 01/22/2022   Chronic respiratory failure with hypoxia ([place]) 01/22/2022   ABLA (acute blood loss anemia) 01/22/2022   Recurrent syncope 01/22/2022   COPD with acute exacerbation ([place]) 11/06/2021   Acute on chronic respiratory failure with hypercapnia ([place]) 11/05/2021   AKI (acute kidney injury) ([place]) 11/05/2021   Obesity (BMI 30-39.9)    Benign prostatic hyperplasia without lower urinary tract symptoms    COPD exacerbation ([place]) 07/13/2020   Prolonged QT interval 07/13/2020   Acute on chronic respiratory failure with hypoxia (HCC) 07/13/2020   Chronic diastolic CHF (congestive heart failure) ([place]) 07/13/2020   Community acquired pneumonia 01/18/2020   Coronary atherosclerosis 10/04/2017   GERD (gastroesophageal reflux disease) 10/04/2017   Essential hypertension 10/04/2017   Transient visual loss 10/04/2017   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   Nonrheumatic aortic valve stenosis 03/31/2017   Pericarditis, Acute 02/12/2017   Acute on chronic right heart failure ([place]) 02/11/2017   Angina at rest 08/17/2016   Unstable angina ([place]) 08/16/2016   Tobacco abuse 08/16/2016   Antiphospholipid syndrome ([place]) 08/16/2016   Hyperlipidemia 08/16/2016   Essential hypertension, benign 08/16/2016   COPD (chronic obstructive pulmonary disease) ([place]) 08/16/2016   Sleep apnea 08/16/2016   CAD S/P multiple PCIs 08/16/2016   History of pulmonary embolus (PE)-2011 08/16/2016   Chronic anticoagulation-Couamdin 08/16/2016   Type 2 diabetes mellitus with hyperlipidemia ([place]) 08/16/2016   Carotid artery disease ([place]) 08/16/2016   RBBB (right bundle branch block) 08/16/2016   Dyspnea on exertion 10/17/2015   ED (erectile dysfunction) 08/09/2012   PCP:  Elenore Paddy, MD Pharmacy:   [id number] [place], [place] - [address] AT [address] [place] [address] Phone: [phone] Fax: [phone]     Social Determinants of Health (SDOH) Interventions    Readmission Risk Interventions    01/23/2022   11:20 AM 07/16/2020   12:39 PM  Readmission Risk Prevention Plan  Transportation Screening Complete Complete  PCP or Specialist Appt within 3-5 Days Complete Complete  HRI or Home Care Consult Complete Complete  Social Work Consult for [place] Planning/Counseling Complete Complete  Palliative Care Screening Not Applicable Not Applicable  Medication Review Press photographer) Complete Complete

## 2022-01-23 NOTE — Progress Notes (Signed)
PROGRESS NOTE Abrahan Fulmore  GYI:[id number] DOB: 1956-01-19 DOA: 01/22/2022 PCP: [name], MD   Brief Narrative/Hospital Course: 66 y.o. 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 CXR-cardiomegaly with mild interstitial edema CTA abdomen and pelvis: Hematoma in the right gluteal subcutaneous fat. No evidence ofactive bleeding. Ectasia of the left common iliac artery measuring 1.7 cm.No acute abnormality in the abdomen or pelvis.Bilateral nonobstructing nephrolithiasis measuring up to 1.0 cm on the right.Hepatic steatosis. Patient admitted for acute blood loss anemia with traumatic hematoma of the buttock   Subjective: Seen and examined Resting well Pain improving with hot compress Denies shortness of breath nausea vomiting fever chills Did not use cpap and mask did not fit well   Assessment and Plan: Principal Problem:   Traumatic hematoma of buttock Active Problems:   ABLA (acute blood loss anemia)   History of pulmonary embolus (PE)-2011   Chronic anticoagulation-Couamdin   Essential hypertension, benign   COPD (chronic obstructive pulmonary disease) (HCC)   CAD S/P multiple PCIs   Type 2 diabetes mellitus with hyperlipidemia (HCC)   Chronic respiratory failure with hypoxia (HCC)   Recurrent syncope   APS (antiphospholipid syndrome) (HCC)   Traumatic hematoma of left buttock + Acute blood loss anemia : Baseline hemoglobin in  10/2021 9-10 gm.  Acute blood loss anemia in the setting of traumatic left buttock hematoma> referrto the CT scan.  I asked RN to demarcate the hematoma site, continue to monitor, continue hot compresses, appreciate surgery input> will consider surgical intervention if this gets infected or overlying skin necrosis occurs> surgery recommend follow-up for repeat examination in the area in couple days.  Hemoglobin fluctuating 7.1 to 7.8 g>monitor and transfuse if less than 7 g ( he has consented for transfusion understanding risks/benefits which I explained). Will get h/h this evening. Bruise on left arm- [place] ordered. Recent Labs  Lab 01/21/22 2005 01/22/22 0541 01/22/22 1524 01/22/22 2241 01/23/22 0326  HGB 7.8* 7.3* 7.7* 7.8* 7.1*  HCT 25.7*  --  25.8* 26.5* 23.8*    History of PE 2011 Hx of Antiphospholipid antibody syndrome  on Coumadin: INR 1.9 today> managed by Dr. Ether Griffins, ideally maintained 2.5-3 he states, but patient reports recently due to syncope and fall has been maintained on low 2s. I made a call to his  PCP office> left message waiting for callback. I have requested hematology consult w/ Dr. Orlie Dakin  to help with anticoagulation and transfusion management in the setting of acute blood loss anemia/traumatic hematoma:Discussed with Dr Orlie Dakin he has seen the patient-he feels it is unclear if he actually has antiphospholipid syndrome he has ordered blood test to confirm diagnosis and advised bridging patient with heparin once labs are drawn this evening>hopefully resume Coumadin once H&H stable/hematoma stable.Possible conversion to DOAC as outpatient:We will wait for further hematology recommendation.I have consulted pharmacy to bridge heparin after labs are drawn this evening. Recent Labs  Lab 01/21/22 2005 01/22/22 1524 01/23/22 0326  INR 2.2* 2.0* 1.9*   Vertebrobasilar insufficiency Recurrent syncope, longstanding  history with previous workup and follow up: He has had previous  multiple work ups- had  MRI/MRA head/neck in 2023, Echo in 2020 EF 55 to 60% G1 DD. mild to moderate aortic valve stenosis.Also had a Holter monitor placement previously. Cont fall precautions and encouraged f/u with his primary cardio. Will order orthostatics vitals. We will need to have his hematology or PCP revisit/review his anticoagulation in the setting of recurrent syncope.  Diabetes mellitus on glimepiride:Blood sugar well controlled on SSI.  Hold p.o. meds.  Continue home Neurontin Recent Labs  Lab 01/22/22 1756 01/22/22 2119 01/23/22 0816 01/23/22 1156 01/23/22 1652  GLUCAP 201* 162* 142* 123* 131*    CAD with history of PCI stent, CABG X3 Hypertension Chronic lower leg edema: No chest pain.  BP stable.  Cont his home asa, metoprolol, statin, torsemide.  COPD with chronic respiratory failure with hypoxia and hypercapnea on 4l [place] and Home Trilogy: Not in exacerbation.cont home Trelegy inhalers,nebulizer, [place] o2 at 4l . On Trilogy bipap at home-ordered bedtime bipap here but he did not like it/mask issues.  GHW:EXHBZJIR home Flomax.  Class II Obesity:Patient's Body mass index is 37.44 kg/m. : Will benefit with PCP follow-up, weight loss and healthy lifestyle  DVT prophylaxis: SCDs Start: 01/22/22 0458 Code Status:   Code Status: Full Code Family Communication: plan of care discussed with patient at bedside. Patient status is: Inpatient because of acute blood loss anemia  Level of care: Med-Surg  Dispo: The patient is from: home            Anticipated disposition: TBD  Mobility Assessment (last 72 hours)     Mobility Assessment     Row Name 01/22/22 2300 01/22/22 2152         Does patient have an order for bedrest or is patient medically unstable No - Continue assessment No - Continue assessment      What is the highest level of mobility based on the progressive mobility assessment? Level 5 (Walks with assist in room/hall) - Balance while stepping forward/back and can  walk in room with assist - Complete Level 5 (Walks with assist in room/hall) - Balance while stepping forward/back and can walk in room with assist - Complete              Objective: Vitals last 24 hrs: Vitals:   01/22/22 2120 01/23/22 0337 01/23/22 0815 01/23/22 1528  BP:  (!) 105/54 135/69 (!) 114/56  Pulse:  66 79 77  Resp:  18 20 19   Temp:  98 F (36.7 C) 98.6 F (37 C) 98.4 F (36.9 C)  TempSrc:  Oral Oral Oral  SpO2:  99% 99% 100%  Weight: 111.7 kg     Height: 5\' 8"  (1.727 m)      Weight change: 2.837 kg  Physical Examination: General exam: alert awake, obese, pleasant not in distress on nasal cannula oxygen,older than stated age HEENT:Oral mucosa moist, Ear/Nose WNL grossly Respiratory system: bilaterally diminished BS,no use of accessory muscle Cardiovascular system: S1 & S2 +, No JVD. Gastrointestinal system: Abdomen soft,NT,ND, BS+ Nervous System:Alert, awake, moving extremities. Extremities: LE edema + chronic edematous lower leg with hyperkeratosis Skin: No rashes,no icterus. MSK: Normal muscle bulk,tone, power  Medications reviewed:  Scheduled Meds:  aspirin EC  81 mg Oral Daily   DULoxetine  30 mg Oral Daily   DULoxetine  60 mg Oral Daily   fluticasone furoate-vilanterol  1 puff Inhalation Daily   And   umeclidinium bromide  1 puff  Inhalation Daily   gabapentin  600 mg Oral TID   insulin aspart  0-15 Units Subcutaneous TID WC   insulin aspart  0-5 Units Subcutaneous QHS   metoprolol tartrate  12.5 mg Oral BID   pantoprazole  40 mg Oral BID   rosuvastatin  10 mg Oral QPM   tamsulosin  0.8 mg Oral Daily   topiramate  75 mg Oral TID   torsemide  20 mg Oral Daily  Continuous Infusions:   Diet Order             Diet heart healthy/carb modified Room service appropriate? Yes; Fluid consistency: Thin  Diet effective now                  Intake/Output Summary (Last 24 hours) at 01/23/2022 1721 Last data filed at 01/23/2022 1543 Gross per 24  hour  Intake 960 ml  Output 2100 ml  Net -1140 ml   Net IO Since Admission: -1,140 mL [01/23/22 1721]  Wt Readings from Last 3 Encounters:  01/22/22 111.7 kg  11/05/21 108.9 kg  10/28/21 111.1 kg     Unresulted Labs (From admission, onward)     Start     Ordered   01/24/22 0500  Cardiolipin antibodies, IgG, IgM, IgA  Once,   R        01/24/22 0500   01/24/22 0500  Beta-2-glycoprotein i abs, IgG/M/A  Once,   R        01/24/22 0500   01/24/22 0500  Lupus anticoagulant panel  Once,   R        01/24/22 0500   01/23/22 0500  CBC  Daily at 5am,   R      01/22/22 0813   01/23/22 0500  Basic metabolic panel  Daily at 5am,   R      01/22/22 0813   01/23/22 0500  Protime-INR  Daily at 5am,   R      01/22/22 0813          Data Reviewed: I have personally reviewed following labs and imaging studies CBC: Recent Labs  Lab 01/21/22 2005 01/22/22 0541 01/22/22 1524 01/22/22 2241 01/23/22 0326  WBC 12.0*  --   --   --  10.7*  HGB 7.8* 7.3* 7.7* 7.8* 7.1*  HCT 25.7*  --  25.8* 26.5* 23.8*  MCV 94.1  --   --   --  94.4  PLT 232  --   --   --  215   Basic Metabolic Panel: Recent Labs  Lab 01/21/22 2005 01/23/22 0326  NA 142 143  K 3.5 3.7  CL 96* 96*  CO2 38* 41*  GLUCOSE 82 164*  BUN 27* 27*  CREATININE 0.97 1.07  CALCIUM 8.8* 8.4*  Culture/Microbiology    Component Value Date/Time   SDES BLOOD RIGHT ANTECUBITAL 07/13/2020 1723   SPECREQUEST  07/13/2020 1723    BOTTLES DRAWN AEROBIC AND ANAEROBIC Blood Culture adequate volume   CULT  07/13/2020 1723    NO GROWTH 5 DAYS Performed at [facility], [address] [place] [place], [place] [address]    REPTSTATUS 07/18/2020 FINAL 07/13/2020 1723  Radiology Studies: CT Angio Abd/Pel W and/or Wo Contrast  Result Date: 01/22/2022 CLINICAL DATA:  Hematoma in right buttock on Coumadin EXAM: CTA ABDOMEN AND PELVIS WITHOUT AND WITH CONTRAST TECHNIQUE: Multidetector CT imaging of the abdomen and pelvis was performed using  the standard protocol during bolus administration of intravenous contrast. Multiplanar reconstructed images and MIPs  were obtained and reviewed to evaluate the vascular anatomy. RADIATION DOSE REDUCTION: This exam was performed according to the departmental dose-optimization program which includes automated exposure control, adjustment of the mA and/or kV according to patient size and/or use of iterative reconstruction technique. CONTRAST:  OMNIPAQUE IOHEXOL 350 MG/ML SOLN COMPARISON:  CT abdomen and pelvis 01/20/2020 FINDINGS: VASCULAR Aorta: Calcified athero sclerotic plaque without significant narrowing. No aneurysm or dissection. Celiac: Atherosclerotic plaque at the origin causes moderate narrowing. No aneurysm or dissection. Normal variant origin of the left gastric directly from the aorta. SMA: Patent without evidence of aneurysm, dissection, vasculitis or significant stenosis. Renals: Both renal arteries are patent without evidence of aneurysm, dissection, vasculitis, fibromuscular dysplasia or significant stenosis. IMA: Patent without evidence of aneurysm, dissection, vasculitis or significant stenosis. 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 Calcified athero sclerotic plaque in both common, internal, and external iliac arteries. No dissection. Proximal Outflow: Bilateral common femoral and visualized portions of the superficial and profunda femoral arteries are patent without evidence of aneurysm, dissection, vasculitis or significant stenosis. Veins: Unremarkable. Review of the MIP images confirms the above findings. NON-VASCULAR Lower chest: Bibasilar scarring/atelectasis.  No acute abnormality. Hepatobiliary: Hepatic steatosis. No focal liver abnormality is seen. No gallstones, gallbladder wall thickening, or biliary dilatation. Pancreas: Unremarkable. No pancreatic ductal dilatation or surrounding inflammatory changes. Spleen: Normal in size without focal abnormality.  Adrenals/Urinary Tract: Unremarkable adrenal glands. Bilateral nephrolithiasis measuring up to 1.0 cm right. No hydronephrosis or obstructing urinary calculi. Unremarkable bladder. Stomach/Bowel: Normal caliber large and small bowel. Moderate colonic stool burden. Unremarkable stomach. Lymphatic: Unchanged bilateral external iliac lymphadenopathy measuring up to 1.4 cm on the right. Reproductive: Unremarkable. Other: No free intraperitoneal fluid or air. Musculoskeletal: Edema or contusion about the bilateral posterior gluteal subcutaneous fat. Within the right gluteal subcutaneous fat there is a hyperdense fluid collection measuring approximately 9.3 by 3.7 cm. No evidence of active contrast extravasation. This does not change in density between phases. Thoracolumbar spondylosis. No acute osseous abnormality. IMPRESSION: VASCULAR Hematoma in the right gluteal subcutaneous fat. No evidence of active bleeding. No acute aortic syndrome.  Aortic Atherosclerosis (ICD10-I70.0). 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 NON-VASCULAR No acute abnormality in the abdomen or pelvis. Bilateral nonobstructing nephrolithiasis measuring up to 1.0 cm on the right. Hepatic steatosis. Electronically Signed   By: Minerva Fester M.D.   On: 01/22/2022 03:51   DG Chest 1 View  Result Date: 01/21/2022 CLINICAL DATA:  Loss of consciousness and low oxygen saturation; history of COPD EXAM: CHEST  1 VIEW COMPARISON:  Radiograph 11/08/2018 FINDINGS: Stable cardiomegaly.  Sternotomy and CABG. Increased interstitial coarsening compared with 11/07/2021. Bibasilar atelectasis. Question trace bilateral pleural effusions. No pneumothorax. No acute osseous abnormality. IMPRESSION: Cardiomegaly with possible mild interstitial edema. Question trace bilateral pleural effusions. Electronically Signed   By: Minerva Fester M.D.   On: 01/21/2022 20:38     LOS: 1 day   Lanae Boast, MD [facility]  01/23/2022, 5:21 PM

## 2022-01-24 DIAGNOSIS — S300XXA Contusion of lower back and pelvis, initial encounter: Secondary | ICD-10-CM | POA: Diagnosis not present

## 2022-01-24 LAB — BASIC METABOLIC PANEL
Anion gap: 6 (ref 5–15)
BUN: 26 mg/dL — ABNORMAL HIGH (ref 8–23)
CO2: 39 mmol/L — ABNORMAL HIGH (ref 22–32)
Calcium: 8.5 mg/dL — ABNORMAL LOW (ref 8.9–10.3)
Chloride: 97 mmol/L — ABNORMAL LOW (ref 98–111)
Creatinine, Ser: 0.89 mg/dL (ref 0.61–1.24)
GFR, Estimated: >60 mL/min (ref >60)
Glucose, Bld: 128 mg/dL — ABNORMAL HIGH (ref 70–99)
Potassium: 4.3 mmol/L (ref 3.5–5.1)
Sodium: 142 mmol/L (ref 135–145)

## 2022-01-24 LAB — HEPARIN LEVEL (UNFRACTIONATED)
Heparin Unfractionated: 0.28 IU/mL — ABNORMAL LOW (ref 0.30–0.70)
Heparin Unfractionated: 0.23 IU/mL — ABNORMAL LOW (ref 0.30–0.70)
Heparin Unfractionated: 0.31 IU/mL (ref 0.30–0.70)

## 2022-01-24 LAB — CBC
HCT: 24.4 % — ABNORMAL LOW (ref 39.0–52.0)
Hemoglobin: 7.2 g/dL — ABNORMAL LOW (ref 13.0–17.0)
MCH: 28 pg (ref 26.0–34.0)
MCHC: 29.5 g/dL — ABNORMAL LOW (ref 30.0–36.0)
MCV: 94.9 fL (ref 80.0–100.0)
Platelets: 203 10*3/uL (ref 150–400)
RBC: 2.57 MIL/uL — ABNORMAL LOW (ref 4.22–5.81)
RDW: 15.9 % — ABNORMAL HIGH (ref 11.5–15.5)
WBC: 11.4 10*3/uL — ABNORMAL HIGH (ref 4.0–10.5)
nRBC: 0 % (ref 0.0–0.2)

## 2022-01-24 LAB — GLUCOSE, CAPILLARY
Glucose-Capillary: 149 mg/dL — ABNORMAL HIGH (ref 70–99)
Glucose-Capillary: 156 mg/dL — ABNORMAL HIGH (ref 70–99)
Glucose-Capillary: 127 mg/dL — ABNORMAL HIGH (ref 70–99)
Glucose-Capillary: 140 mg/dL — ABNORMAL HIGH (ref 70–99)

## 2022-01-24 LAB — PREPARE RBC (CROSSMATCH)

## 2022-01-24 MED ORDER — IPRATROPIUM-ALBUTEROL 0.5-2.5 (3) MG/3ML IN SOLN: 3.0000 mL | RESPIRATORY_TRACT | Status: DC | PRN

## 2022-01-24 MED ORDER — HYDRALAZINE HCL 20 MG/ML IJ SOLN: 10.0000 mg | INTRAMUSCULAR | Status: DC | PRN

## 2022-01-24 MED ORDER — ONDANSETRON HCL 4 MG/2ML IJ SOLN: 4.0000 mg | Freq: Four times a day (QID) | INTRAMUSCULAR | Status: DC | PRN

## 2022-01-24 MED ORDER — OXYCODONE HCL 5 MG PO TABS
5.0000 mg | ORAL_TABLET | ORAL | Status: DC | PRN
  Administered 2022-01-24 – 2022-01-25 (×2): 5 mg via ORAL
  Filled 2022-01-24 (×2): qty 1

## 2022-01-24 MED ORDER — HEPARIN BOLUS VIA INFUSION
1400.0000 [IU] | Freq: Once | INTRAVENOUS | Status: AC
  Administered 2022-01-24: 1400 [IU] via INTRAVENOUS
  Filled 2022-01-24: qty 1400

## 2022-01-24 MED ORDER — TRAZODONE HCL 50 MG PO TABS: 50.0000 mg | ORAL_TABLET | Freq: Every evening | ORAL | Status: DC | PRN

## 2022-01-24 MED ORDER — IPRATROPIUM-ALBUTEROL 0.5-2.5 (3) MG/3ML IN SOLN
3.0000 mL | RESPIRATORY_TRACT | Status: DC | PRN
  Administered 2022-01-25: 3 mL via RESPIRATORY_TRACT
  Filled 2022-01-24: qty 3

## 2022-01-24 MED ORDER — GUAIFENESIN 100 MG/5ML PO LIQD: 5.0000 mL | ORAL | Status: DC | PRN

## 2022-01-24 MED ORDER — SODIUM CHLORIDE 0.9% IV SOLUTION: Freq: Once | INTRAVENOUS | Status: AC

## 2022-01-24 MED ORDER — HEPARIN BOLUS VIA INFUSION
2000.0000 [IU] | Freq: Once | INTRAVENOUS | Status: AC
  Administered 2022-01-24: 2000 [IU] via INTRAVENOUS
  Filled 2022-01-24: qty 2000

## 2022-01-24 MED ORDER — METOPROLOL TARTRATE 5 MG/5ML IV SOLN: 5.0000 mg | INTRAVENOUS | Status: DC | PRN

## 2022-01-24 MED ORDER — SENNOSIDES-DOCUSATE SODIUM 8.6-50 MG PO TABS: 1.0000 | ORAL_TABLET | Freq: Every evening | ORAL | Status: DC | PRN

## 2022-01-24 NOTE — Plan of Care (Signed)
Patient axox4,RA, independent in room, likes to sleep in recliner. In recoliner all day, kpad to right buttock continuous. Slight spreading of hematoma outside of marked location, provider notified no orders given. Heparin titrated x1 today. 1 unit PRBC given per provider ok to re-check hgb with morning labs. PRN for pain given x1 today. Continent B&B.  Problem: Education: Goal: Ability to describe self-care measures that may prevent or decrease complications (Diabetes Survival Skills Education) will improve 01/24/2022 1847 by Earnestine Mealing, RN Outcome: Progressing 01/24/2022 1431 by Earnestine Mealing, RN Outcome: Progressing Goal: Individualized Educational Video(s) 01/24/2022 1847 by Earnestine Mealing, RN Outcome: Progressing 01/24/2022 1431 by Earnestine Mealing, RN Outcome: Progressing   Problem: Coping: Goal: Ability to adjust to condition or change in health will improve 01/24/2022 1847 by Earnestine Mealing, RN Outcome: Progressing 01/24/2022 1431 by Earnestine Mealing, RN Outcome: Progressing   Problem: Fluid Volume: Goal: Ability to maintain a balanced intake and output will improve 01/24/2022 1847 by Earnestine Mealing, RN Outcome: Progressing 01/24/2022 1431 by Earnestine Mealing, RN Outcome: Progressing   Problem: Health Behavior/Discharge Planning: Goal: Ability to identify and utilize available resources and services will improve Outcome: Progressing Goal: Ability to manage health-related needs will improve Outcome: Progressing   Problem: Metabolic: Goal: Ability to maintain appropriate glucose levels will improve Outcome: Progressing   Problem: Nutritional: Goal: Maintenance of adequate nutrition will improve Outcome: Progressing Goal: Progress toward achieving an optimal weight will improve Outcome: Progressing   Problem: Tissue Perfusion: Goal: Adequacy of tissue perfusion will improve 01/24/2022 1847 by Earnestine Mealing, RN Outcome:  Progressing 01/24/2022 1431 by Earnestine Mealing, RN Outcome: Progressing   Problem: Skin Integrity: Goal: Risk for impaired skin integrity will decrease 01/24/2022 1847 by Earnestine Mealing, RN Outcome: Progressing 01/24/2022 1431 by Earnestine Mealing, RN Outcome: Progressing   Problem: Education: Goal: Knowledge of General Education information will improve Description: Including pain rating scale, medication(s)/side effects and non-pharmacologic comfort measures 01/24/2022 1847 by Earnestine Mealing, RN Outcome: Progressing 01/24/2022 1431 by Earnestine Mealing, RN Outcome: Progressing   Problem: Clinical Measurements: Goal: Ability to maintain clinical measurements within normal limits will improve Outcome: Progressing Goal: Will remain free from infection Outcome: Progressing Goal: Diagnostic test results will improve Outcome: Progressing Goal: Respiratory complications will improve Outcome: Progressing Goal: Cardiovascular complication will be avoided Outcome: Progressing   Problem: Health Behavior/Discharge Planning: Goal: Ability to manage health-related needs will improve Outcome: Progressing   Problem: Skin Integrity: Goal: Risk for impaired skin integrity will decrease Outcome: Progressing   Problem: Safety: Goal: Ability to remain free from injury will improve Outcome: Progressing   Problem: Pain Managment: Goal: General experience of comfort will improve Outcome: Progressing   Problem: Elimination: Goal: Will not experience complications related to bowel motility Outcome: Progressing Goal: Will not experience complications related to urinary retention Outcome: Progressing   Problem: Coping: Goal: Level of anxiety will decrease Outcome: Progressing

## 2022-01-24 NOTE — Progress Notes (Signed)
PROGRESS NOTE    Jose [name]  IRC:[id number] DOB: 07-12-1955 DOA: 01/22/2022 PCP: [name], MD   Brief Narrative:  66 y.o. 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 CXR-cardiomegaly with mild interstitial edema.  CT abdomen pelvis showed hematoma in the right gluteal subcutaneous fat without any evidence of active bleeding, bilateral nonobstructing renal stones.   Assessment & Plan:  Principal Problem:   Traumatic hematoma of buttock Active Problems:   ABLA (acute blood loss anemia)   History of pulmonary embolus (PE)-2011   Chronic anticoagulation-Couamdin   Essential hypertension, benign   COPD (chronic obstructive pulmonary disease) (HCC)   CAD S/P multiple PCIs   Type 2 diabetes mellitus with hyperlipidemia (HCC)   Chronic respiratory failure with hypoxia (HCC)   Recurrent syncope   APS (antiphospholipid syndrome) (HCC)    Traumatic hematoma of left buttock + Acute blood loss anemia : Baseline hemoglobin 9, admission hemoglobin around 7.5 and has been fluctuating CT scan consistent with hematoma but no active bleeding. Will order 1U PRBC (patient agreeable). Currently on Heparin Drip.   History of PE 2011 Hx of Antiphospholipid antibody syndrome  on Coumadin: Goal INR as low 2.0 due to recurrent Falls. Heme consulted, there is concerns about his antiphospholipid Dx. Repeat Blood work ordered, Possible consideration to  transition him to DOAC due to low risk Dx. Currently on Heparin Drip.   Vertebrobasilar insufficiency Recurrent syncope, longstanding history with previous workup and follow up: He has had previous multiple work ups- had  MRI/MRA head/neck in 2023, Echo in 2020 EF 55 to 60% G1 DD. mild to moderate aortic valve stenosis.Also had a Holter monitor placement previously. Fall precaution, supportive care.    Diabetes mellitus on glimepiride:Blood sugar well controlled on SSI.  Hold p.o. meds.  Continue home Neurontin  CAD with history of PCI stent, CABG X3 Currently chest pain free. On Home ASA/Statin.   Hypertension Chronic lower leg edema: No chest pain.  BP stable.  Cont his home asa, metoprolol, statin, torsemide.   COPD with chronic respiratory failure with hypoxia and hypercapnea on 4l [place] and Home Trilogy: Not in exacerbation.cont home Trelegy inhalers,nebulizer, Follansbee o2 at 4l . On Trilogy bipap at home-ordered bedtime bipap here but he did not like it/mask issues.   PZW:CHENIDPO home Flomax.   Class II Obesity:Patient's Body mass index is 37.44 kg/m. : Will benefit with PCP follow-up, weight loss and healthy lifestyle   DVT prophylaxis: Heparin Drip Code Status: Full Family Communication:    Status is: Inpatient Maintain hosp stay until Hb has remained stable and is cleared by Heme. Currently on Heparin Drip.      Subjective:  Feels ok, no new complaints. No further signs of active bleeding.   Examination:  General exam: Appears calm and comfortable  Respiratory system: Clear to auscultation. Respiratory effort normal. Cardiovascular system: S1 & S2 heard, RRR. No JVD, murmurs, rubs, gallops or clicks. No pedal edema. Gastrointestinal system: Abdomen is nondistended, soft and nontender. No organomegaly or masses felt.  Normal bowel sounds heard. Central nervous system: Alert and oriented. No focal neurological deficits. Extremities: Symmetric 5 x 5 power. Right gluteal area  bruising.  Skin: No rashes, lesions or ulcers Psychiatry: Judgement and insight appear normal. Mood & affect appropriate.     Objective: Vitals:   01/23/22 1528 01/23/22 2013 01/24/22 0506 01/24/22 0800  BP: (!) 114/56 (!) 126/54 (!) 102/54 (!) 108/54  Pulse: 77 76 65 70  Resp: 19 17 16    Temp: 98.4 F (36.9 C) 97.9 F (36.6 C) 98.6 F (37 C) 98.6 F (37 C)  TempSrc: Oral     SpO2: 100% 100% 100% 100%  Weight:      Height:        Intake/Output Summary (Last 24 hours) at 01/24/2022 0836 Last data filed at 01/24/2022 0354 Gross per 24 hour  Intake 1169.41 ml  Output 2800 ml  Net -1630.59 ml   Filed Weights   01/21/22 2002 01/22/22 2120  Weight: 108.9 kg 111.7 kg     Data Reviewed:   CBC: Recent Labs  Lab 01/21/22 2005 01/22/22 0541 01/22/22 1524 01/22/22 2241 01/23/22 0326 01/23/22 1753 01/24/22 0757  WBC 12.0*  --   --   --  10.7*  --  11.4*  HGB 7.8*   < > 7.7* 7.8* 7.1* 7.1* 7.2*  HCT 25.7*  --  25.8* 26.5* 23.8* 24.2* 24.4*  MCV 94.1  --   --   --  94.4  --  94.9  PLT 232  --   --   --  215  --  203   < > = values in this interval not displayed.   Basic Metabolic Panel: Recent Labs  Lab 01/21/22 2005 01/23/22 0326 01/24/22 0757  NA 142 143 142  K 3.5 3.7 4.3  CL 96* 96* 97*  CO2 38* 41* 39*  GLUCOSE 82 164* 128*  BUN 27* 27* 26*  CREATININE 0.97 1.07 0.89  CALCIUM 8.8* 8.4* 8.5*   GFR: Estimated Creatinine Clearance: 99 mL/min (by C-G formula based on SCr of 0.89 mg/dL). Liver Function Tests: No results for input(s): "AST", "ALT", "ALKPHOS", "BILITOT", "PROT", "ALBUMIN" in the last 168 hours. No results for input(s): "LIPASE", "AMYLASE" in the last 168 hours. No results for input(s): "AMMONIA" in the last 168 hours. Coagulation Profile: Recent Labs  Lab 01/21/22 2005 01/22/22 1524 01/23/22 0326 01/23/22 1753  INR 2.2* 2.0* 1.9* 1.6*   Cardiac Enzymes: No results for input(s): "CKTOTAL", "CKMB", "CKMBINDEX", "TROPONINI" in the  last 168 hours. BNP (last 3 results) No results for input(s): "PROBNP" in the last 8760 hours. HbA1C: No results for input(s): "HGBA1C" in the last 72 hours. CBG: Recent Labs  Lab 01/23/22 0816 01/23/22 1156 01/23/22 1652 01/23/22 2106 01/24/22 0757  GLUCAP 142* 123* 131* 117* 149*   Lipid Profile: No results for input(s): "CHOL", "HDL", "LDLCALC", "TRIG", "CHOLHDL", "LDLDIRECT" in the last 72 hours. Thyroid Function Tests: No results for input(s): "TSH", "T4TOTAL", "FREET4", "T3FREE", "THYROIDAB" in the last 72 hours. Anemia Panel: No results for input(s): "VITAMINB12", "FOLATE", "FERRITIN", "TIBC", "IRON", "RETICCTPCT" in the last 72 hours. Sepsis Labs: No results for input(s): "PROCALCITON", "LATICACIDVEN" in the last 168 hours.  No results found for this or any previous visit (from the past 240 hour(s)).       Radiology Studies: 13/11/23 LT UPPER EXTREM LTD SOFT TISSUE NON VASCULAR  Result Date: 01/23/2022 CLINICAL DATA:  Left arm bruise/hematoma in the antecubital region. EXAM: ULTRASOUND LEFT UPPER EXTREMITY LIMITED TECHNIQUE: Ultrasound examination  of the upper extremity soft tissues was performed in the area of clinical concern. COMPARISON:  None Available. FINDINGS: No cystic lesion or soft tissue mass identified. There is likely some infiltrative edema in the left antecubital fossa for example on image 7 of series 1 -1, but no drainable collection. IMPRESSION: 1. No cystic lesion or soft tissue mass identified in the left antecubital fossa. There is likely some edema in the left antecubital fossa, but no drainable collection. Electronically Signed   By: Gaylyn Rong M.D.   On: 01/23/2022 17:45        Scheduled Meds:  aspirin EC  81 mg Oral Daily   DULoxetine  30 mg Oral Daily   DULoxetine  60 mg Oral Daily   fluticasone furoate-vilanterol  1 puff Inhalation Daily   And   umeclidinium bromide  1 puff Inhalation Daily   gabapentin  600 mg Oral TID   heparin   2,000 Units Intravenous Once   insulin aspart  0-15 Units Subcutaneous TID WC   insulin aspart  0-5 Units Subcutaneous QHS   metoprolol tartrate  12.5 mg Oral BID   pantoprazole  40 mg Oral BID   rosuvastatin  10 mg Oral QPM   tamsulosin  0.8 mg Oral Daily   topiramate  75 mg Oral TID   torsemide  20 mg Oral Daily   Continuous Infusions:  heparin 1,750 Units/hr (01/24/22 0743)     LOS: 2 days   Time spent= 35 mins    Orva Gwaltney Joline Maxcy, MD [facility]  If 7PM-7AM, please contact night-coverage  01/24/2022, 8:36 AM

## 2022-01-24 NOTE — Progress Notes (Addendum)
ANTICOAGULATION CONSULT NOTE   Pharmacy Consult for Heparin Infusion Indication: pulmonary embolus/antiphospholipid antibody syndrome  Allergies  Allergen Reactions   Melatonin Anxiety   Altace [Ramipril] Cough    Patient Measurements: Height: 5\' 8"  (172.7 cm) Weight: 111.7 kg (246 lb 4.1 oz) IBW/kg (Calculated) : 68.4 Heparin Dosing Weight: 93.4 kg  Vital Signs: Temp: 98.6 F (37 C) (11/11 0800) BP: 108/54 (11/11 0800) Pulse Rate: 70 (11/11 0800)  Labs: Recent Labs    01/21/22 2005 01/22/22 0016 01/22/22 0541 01/22/22 1524 01/22/22 2241 01/23/22 0326 01/23/22 1753 01/24/22 0057 01/24/22 0757  HGB 7.8*  --    < > 7.7*   < > 7.1* 7.1*  --  7.2*  HCT 25.7*  --   --  25.8*   < > 23.8* 24.2*  --  24.4*  PLT 232  --   --   --   --  215  --   --  203  APTT  --   --   --   --   --   --  46*  --   --   LABPROT 24.2*  --   --  22.8*  --  21.6* 19.0*  --   --   INR 2.2*  --   --  2.0*  --  1.9* 1.6*  --   --   HEPARINUNFRC  --   --   --   --   --   --  <0.10* 0.28* 0.23*  CREATININE 0.97  --   --   --   --  1.07  --   --  0.89  TROPONINIHS 11 12  --   --   --   --   --   --   --    < > = values in this interval not displayed.     Estimated Creatinine Clearance: 99 mL/min (by C-G formula based on SCr of 0.89 mg/dL).   Medical History: Past Medical History:  Diagnosis Date   Antiphospholipid antibody syndrome (HCC)    CAD S/P percutaneous coronary angioplasty '98, '04, Feb 2017   [facility]   Carotid arterial disease (HCC) 10/2015   moderate bilateral   COPD (chronic obstructive pulmonary disease) (HCC)    Coronary artery disease    Hypertension    Non-insulin treated type 2 diabetes mellitus (HCC)    Pulmonary emboli (HCC) 2011   RBBB    Sleep apnea     Medications:  Scheduled:   aspirin EC  81 mg Oral Daily   DULoxetine  30 mg Oral Daily   DULoxetine  60 mg Oral Daily   fluticasone furoate-vilanterol  1 puff Inhalation Daily   And   umeclidinium bromide  1  puff Inhalation Daily   gabapentin  600 mg Oral TID   insulin aspart  0-15 Units Subcutaneous TID WC   insulin aspart  0-5 Units Subcutaneous QHS   metoprolol tartrate  12.5 mg Oral BID   pantoprazole  40 mg Oral BID   rosuvastatin  10 mg Oral QPM   tamsulosin  0.8 mg Oral Daily   topiramate  75 mg Oral TID   torsemide  20 mg Oral Daily   Infusions:   heparin 1,950 Units/hr (01/24/22 1013)   PRN: acetaminophen **OR** acetaminophen, guaiFENesin, hydrALAZINE, HYDROcodone-acetaminophen, ipratropium-albuterol, metoprolol tartrate, morphine injection, nitroGLYCERIN, ondansetron (ZOFRAN) IV, oxyCODONE, senna-docusate, sodium chloride, traZODone  PTA Warfarin: 6 mg MF, 5 mg [id number] (Goal INR of 2.0 given recurrent falls)  Assessment: 13/11/23  is a 66 y.o. male presenting with R buttock hematoma a. PMH significant for CAD s/p CABG x3, mild aortic valve stenosis, PVD, OSA, COPD on 4-6 L, T2DM, HLD, hx of PE, antiphospholipid antibody syndrome on Warfarin, vertebrobasilar insufficiency causing recurrent syncope. Patient was on [facility] PTA per chart review, last dose of warfarin 5 mg PTA was 11/7 PM. Confirmatory workup for antiphospholipid antibody syndrome is ongoing. Considering possible switch to DOAC. Patient received 1 unit PRBC 11/11 while heparin infusion ran continuously. Pharmacy has been consulted to initiate and manage heparin infusion.   Baseline Labs: aPTT 46, HL <0.10, PT 19.0, INR 1.6, Hgb 7.1, Hct 24.2, Plt 215   Goal of Therapy:  Heparin level 0.3-0.7 units/ml Monitor platelets by anticoagulation protocol: Yes   Date Time HL Rate/Comment  11/11 0057 0.28 1550/SUBtherapeutic 11/11 0757 0.23 1750/SUBtherapeutic 11/11 1822 0.31 1950/therapeutic x1  Plan: Continue heparin infusion at 1950 units/hr Check HL in 6 hours Continue to monitor H&H and platelets daily while on heparin  Celene Squibb, PharmD PGY1 Pharmacy Resident 01/24/2022 1:15 PM

## 2022-01-24 NOTE — Progress Notes (Signed)
ANTICOAGULATION CONSULT NOTE - Initial Consult  Pharmacy Consult for Heparin Indication: pulmonary embolus  Allergies  Allergen Reactions   Melatonin Anxiety   Altace [Ramipril] Cough    Patient Measurements: Height: 5\' 8"  (172.7 cm) Weight: 111.7 kg (246 lb 4.1 oz) IBW/kg (Calculated) : 68.4 Heparin Dosing Weight: 93.4 kg  Vital Signs: Temp: 97.9 F (36.6 C) (11/10 2013) Temp Source: Oral (11/10 1528) BP: 126/54 (11/10 2013) Pulse Rate: 76 (11/10 2013)  Labs: Recent Labs    01/21/22 2005 01/22/22 0016 01/22/22 0541 01/22/22 1524 01/22/22 2241 01/23/22 0326 01/23/22 1753 01/24/22 0057  HGB 7.8*  --    < > 7.7* 7.8* 7.1* 7.1*  --   HCT 25.7*  --   --  25.8* 26.5* 23.8* 24.2*  --   PLT 232  --   --   --   --  215  --   --   APTT  --   --   --   --   --   --  46*  --   LABPROT 24.2*  --   --  22.8*  --  21.6* 19.0*  --   INR 2.2*  --   --  2.0*  --  1.9* 1.6*  --   HEPARINUNFRC  --   --   --   --   --   --  <0.10* 0.28*  CREATININE 0.97  --   --   --   --  1.07  --   --   TROPONINIHS 11 12  --   --   --   --   --   --    < > = values in this interval not displayed.     Estimated Creatinine Clearance: 82.3 mL/min (by C-G formula based on SCr of 1.07 mg/dL).   Medical History: Past Medical History:  Diagnosis Date   Antiphospholipid antibody syndrome [facility])    CAD S/P percutaneous coronary angioplasty '98, '04, Feb 2017   [facility]   Carotid arterial disease (HCC) 10/2015   moderate bilateral   COPD (chronic obstructive pulmonary disease) (HCC)    Coronary artery disease    Hypertension    Non-insulin treated type 2 diabetes mellitus (HCC)    Pulmonary emboli (HCC) 2011   RBBB    Sleep apnea     Medications:  Pt had PTA warfarin:  Last dose 01/20/22.  Assessment: Pharmacy consulted to dose heparin on 66 y.o. M admitted with antiphospholipid syndrome and hx of PE.  PTA warfarin held, INR now subtherapeutic (1.9).  Labs: Hgb 7.1, Hct 23.8, Plts 215, INR  1.9, baseline HL and aPTT ordered.  11/11:  HL @ 0057 = 0.28, SUBtherapeutic   Goal of Therapy:  Heparin level 0.3-0.7 units/ml Monitor platelets by anticoagulation protocol: Yes   Plan:  11/11:  HL @ 0057 = 0.28, SUBtherapeutic  Will order heparin 1400 units IV X 1 bolus and increase drip rate to 1750 units/hr.  Will recheck HL 6 hrs after rate change.   Oletha Tolson D 01/24/2022,1:33 AM

## 2022-01-24 NOTE — Plan of Care (Signed)
Patient Jose [name],RA, takes meds whole. Ambulates independently. Likes to sleep in recliner, heat pad in place. Continent B7B. C/O pain x1 today to right buttock, PRN given.  Problem: Education: Goal: Ability to describe self-care measures that may prevent or decrease complications (Diabetes Survival Skills Education) will improve Outcome: Progressing Goal: Individualized Educational Video(s) Outcome: Progressing   Problem: Coping: Goal: Ability to adjust to condition or change in health will improve Outcome: Progressing   Problem: Health Behavior/Discharge Planning: Goal: Ability to identify and utilize available resources and services will improve Outcome: Progressing Goal: Ability to manage health-related needs will improve Outcome: Progressing   Problem: Fluid Volume: Goal: Ability to maintain a balanced intake and output will improve Outcome: Progressing   Problem: Metabolic: Goal: Ability to maintain appropriate glucose levels will improve Outcome: Progressing   Problem: Nutritional: Goal: Maintenance of adequate nutrition will improve Outcome: Progressing Goal: Progress toward achieving an optimal weight will improve Outcome: Progressing   Problem: Skin Integrity: Goal: Risk for impaired skin integrity will decrease Outcome: Progressing   Problem: Tissue Perfusion: Goal: Adequacy of tissue perfusion will improve Outcome: Progressing   Problem: Education: Goal: Knowledge of General Education information will improve Description: Including pain rating scale, medication(s)/side effects and non-pharmacologic comfort measures Outcome: Progressing   Problem: Health Behavior/Discharge Planning: Goal: Ability to manage health-related needs will improve Outcome: Progressing   Problem: Clinical Measurements: Goal: Ability to maintain clinical measurements within normal limits will improve Outcome: Progressing Goal: Will remain free from infection Outcome:  Progressing Goal: Diagnostic test results will improve Outcome: Progressing Goal: Respiratory complications will improve Outcome: Progressing Goal: Cardiovascular complication will be avoided Outcome: Progressing   Problem: Activity: Goal: Risk for activity intolerance will decrease Outcome: Progressing   Problem: Nutrition: Goal: Adequate nutrition will be maintained Outcome: Progressing   Problem: Coping: Goal: Level of anxiety will decrease Outcome: Progressing   Problem: Elimination: Goal: Will not experience complications related to bowel motility Outcome: Progressing Goal: Will not experience complications related to urinary retention Outcome: Progressing   Problem: Pain Managment: Goal: General experience of comfort will improve Outcome: Progressing   Problem: Safety: Goal: Ability to remain free from injury will improve Outcome: Progressing   Problem: Skin Integrity: Goal: Risk for impaired skin integrity will decrease Outcome: Progressing

## 2022-01-24 NOTE — Progress Notes (Addendum)
ANTICOAGULATION CONSULT NOTE   Pharmacy Consult for Heparin Indication: pulmonary embolus  Allergies  Allergen Reactions   Melatonin Anxiety   Altace [Ramipril] Cough    Patient Measurements: Height: 5\' 8"  (172.7 cm) Weight: 111.7 kg (246 lb 4.1 oz) IBW/kg (Calculated) : 68.4 Heparin Dosing Weight: 93.4 kg  Vital Signs: Temp: 98.6 F (37 C) (11/11 0800) BP: 108/54 (11/11 0800) Pulse Rate: 70 (11/11 0800)  Labs: Recent Labs    01/21/22 2005 01/22/22 0016 01/22/22 0541 01/22/22 1524 01/22/22 2241 01/23/22 0326 01/23/22 1753 01/24/22 0057 01/24/22 0757  HGB 7.8*  --    < > 7.7*   < > 7.1* 7.1*  --  7.2*  HCT 25.7*  --   --  25.8*   < > 23.8* 24.2*  --  24.4*  PLT 232  --   --   --   --  215  --   --  203  APTT  --   --   --   --   --   --  46*  --   --   LABPROT 24.2*  --   --  22.8*  --  21.6* 19.0*  --   --   INR 2.2*  --   --  2.0*  --  1.9* 1.6*  --   --   HEPARINUNFRC  --   --   --   --   --   --  <0.10* 0.28* 0.23*  CREATININE 0.97  --   --   --   --  1.07  --   --  0.89  TROPONINIHS 11 12  --   --   --   --   --   --   --    < > = values in this interval not displayed.     Estimated Creatinine Clearance: 99 mL/min (by C-G formula based on SCr of 0.89 mg/dL).   Medical History: Past Medical History:  Diagnosis Date   Antiphospholipid antibody syndrome [facility])    CAD S/P percutaneous coronary angioplasty '98, '04, Feb 2017   [facility]   Carotid arterial disease (HCC) 10/2015   moderate bilateral   COPD (chronic obstructive pulmonary disease) (HCC)    Coronary artery disease    Hypertension    Non-insulin treated type 2 diabetes mellitus (HCC)    Pulmonary emboli (HCC) 2011   RBBB    Sleep apnea     Medications:  Pt had PTA warfarin:  Last dose 01/20/22.  Assessment: Pharmacy consulted to dose heparin on 66 y.o. M admitted with antiphospholipid syndrome and hx of PE.  PTA warfarin held, INR now subtherapeutic (1.9). Possible antiphospholipid syndrome,  pt reports diagnosed > 10 years ago. Unclear if patient has antiphospholipid syndrome.    11/11:  HL @ 0057 = 0.28, SUBtherapeutic  11/11:  HL @ 0757 = 0.23, Subtherapeutic   Goal of Therapy:  Heparin level 0.3-0.7 units/ml Monitor platelets by anticoagulation protocol: Yes   Plan:  Heparin level is subtherapeutic. Will give heparin bolus of 2000 units x 1. Will increase heparin infusion to 1950 units/hr. Recheck heparin level in 6 hours. CBC with AM labs. F/u with either warfarin or DOAC start.    13/11, PharmD, BCPS 01/24/2022,8:24 AM

## 2022-01-24 NOTE — Plan of Care (Signed)

## 2022-01-25 DIAGNOSIS — S300XXA Contusion of lower back and pelvis, initial encounter: Secondary | ICD-10-CM | POA: Diagnosis not present

## 2022-01-25 LAB — CBC
HCT: 27.5 % — ABNORMAL LOW (ref 39.0–52.0)
Hemoglobin: 8.1 g/dL — ABNORMAL LOW (ref 13.0–17.0)
MCH: 27.6 pg (ref 26.0–34.0)
MCHC: 29.5 g/dL — ABNORMAL LOW (ref 30.0–36.0)
MCV: 93.5 fL (ref 80.0–100.0)
Platelets: 205 10*3/uL (ref 150–400)
RBC: 2.94 MIL/uL — ABNORMAL LOW (ref 4.22–5.81)
RDW: 16.1 % — ABNORMAL HIGH (ref 11.5–15.5)
WBC: 11.5 10*3/uL — ABNORMAL HIGH (ref 4.0–10.5)
nRBC: 0.3 % — ABNORMAL HIGH (ref 0.0–0.2)

## 2022-01-25 LAB — TYPE AND SCREEN
ABO/RH(D): A POS
Antibody Screen: NEGATIVE
Unit division: 0

## 2022-01-25 LAB — BASIC METABOLIC PANEL
Anion gap: 5 (ref 5–15)
BUN: 24 mg/dL — ABNORMAL HIGH (ref 8–23)
CO2: 37 mmol/L — ABNORMAL HIGH (ref 22–32)
Calcium: 8.8 mg/dL — ABNORMAL LOW (ref 8.9–10.3)
Chloride: 100 mmol/L (ref 98–111)
Creatinine, Ser: 0.87 mg/dL (ref 0.61–1.24)
GFR, Estimated: >60 mL/min (ref >60)
Glucose, Bld: 122 mg/dL — ABNORMAL HIGH (ref 70–99)
Potassium: 4.1 mmol/L (ref 3.5–5.1)
Sodium: 142 mmol/L (ref 135–145)

## 2022-01-25 LAB — HEPARIN LEVEL (UNFRACTIONATED)
Heparin Unfractionated: 0.21 IU/mL — ABNORMAL LOW (ref 0.30–0.70)
Heparin Unfractionated: 0.56 IU/mL (ref 0.30–0.70)

## 2022-01-25 LAB — BPAM RBC
Blood Product Expiration Date: 202312032359
ISSUE DATE / TIME: 202311111409
Unit Type and Rh: 6200

## 2022-01-25 LAB — GLUCOSE, CAPILLARY
Glucose-Capillary: 128 mg/dL — ABNORMAL HIGH (ref 70–99)
Glucose-Capillary: 141 mg/dL — ABNORMAL HIGH (ref 70–99)
Glucose-Capillary: 120 mg/dL — ABNORMAL HIGH (ref 70–99)
Glucose-Capillary: 138 mg/dL — ABNORMAL HIGH (ref 70–99)

## 2022-01-25 LAB — MAGNESIUM: Magnesium: 2.3 mg/dL (ref 1.7–2.4)

## 2022-01-25 MED ORDER — WARFARIN SODIUM 6 MG PO TABS
6.0000 mg | ORAL_TABLET | Freq: Once | ORAL | Status: AC
  Administered 2022-01-25: 6 mg via ORAL
  Filled 2022-01-25: qty 1

## 2022-01-25 MED ORDER — HEPARIN BOLUS VIA INFUSION
1500.0000 [IU] | Freq: Once | INTRAVENOUS | Status: AC
  Administered 2022-01-25: 1500 [IU] via INTRAVENOUS
  Filled 2022-01-25: qty 1500

## 2022-01-25 MED ORDER — WARFARIN - PHARMACIST DOSING INPATIENT: Freq: Every day | Status: DC

## 2022-01-25 MED ORDER — ENOXAPARIN SODIUM 120 MG/0.8ML IJ SOSY
1.0000 mg/kg | PREFILLED_SYRINGE | Freq: Two times a day (BID) | INTRAMUSCULAR | Status: DC
  Administered 2022-01-25 – 2022-01-26 (×3): 111 mg via SUBCUTANEOUS
  Filled 2022-01-25 (×3): qty 0.74

## 2022-01-25 NOTE — Progress Notes (Signed)
ANTICOAGULATION CONSULT NOTE   Pharmacy Consult for Heparin Indication: pulmonary embolus  Allergies  Allergen Reactions   Melatonin Anxiety   Altace [Ramipril] Cough    Patient Measurements: Height: 5\' 8"  (172.7 cm) Weight: 111.7 kg (246 lb 4.1 oz) IBW/kg (Calculated) : 68.4 Heparin Dosing Weight: 93.4 kg  Vital Signs: Temp: 97.6 F (36.4 C) (11/11 1943) Temp Source: Oral (11/11 1943) BP: 105/50 (11/11 2147) Pulse Rate: 79 (11/11 1943)  Labs: Recent Labs    01/22/22 1524 01/22/22 2241 01/23/22 0326 01/23/22 1753 01/24/22 0057 01/24/22 0757 01/24/22 1822 01/24/22 2356  HGB 7.7*   < > 7.1* 7.1*  --  7.2*  --   --   HCT 25.8*   < > 23.8* 24.2*  --  24.4*  --   --   PLT  --   --  215  --   --  203  --   --   APTT  --   --   --  46*  --   --   --   --   LABPROT 22.8*  --  21.6* 19.0*  --   --   --   --   INR 2.0*  --  1.9* 1.6*  --   --   --   --   HEPARINUNFRC  --   --   --  <0.10*   < > 0.23* 0.31 0.21*  CREATININE  --   --  1.07  --   --  0.89  --   --    < > = values in this interval not displayed.     Estimated Creatinine Clearance: 99 mL/min (by C-G formula based on SCr of 0.89 mg/dL).   Medical History: Past Medical History:  Diagnosis Date   Antiphospholipid antibody syndrome [facility])    CAD S/P percutaneous coronary angioplasty '98, '04, Feb 2017   [facility]   Carotid arterial disease (HCC) 10/2015   moderate bilateral   COPD (chronic obstructive pulmonary disease) (HCC)    Coronary artery disease    Hypertension    Non-insulin treated type 2 diabetes mellitus (HCC)    Pulmonary emboli (HCC) 2011   RBBB    Sleep apnea     Medications:  Pt had PTA warfarin:  Last dose 01/20/22.  Assessment: Pharmacy consulted to dose heparin on 67 y.o. M admitted with antiphospholipid syndrome and hx of PE.  PTA warfarin held, INR now subtherapeutic (1.9). Possible antiphospholipid syndrome, pt reports diagnosed > 10 years ago. Unclear if patient has antiphospholipid  syndrome.    11/11:  HL @ 0057 = 0.28, SUBtherapeutic  11/11:  HL @ 0757 = 0.23, Subtherapeutic  11/11:  HL @ 2356 = 0.21, SUBtherapeutic Goal of Therapy:  Heparin level 0.3-0.7 units/ml Monitor platelets by anticoagulation protocol: Yes   Plan:  11/11:  HL @ 2356 = 0.21, SUBtherapeutic Will order heparin 1500 units IV X 1 bolus and increase drip rate to 2150 units/hr. Will recheck HL 6 hrs after rate change   Hermena Swint D, PharmD, BCPS 01/25/2022,1:15 AM

## 2022-01-25 NOTE — Progress Notes (Signed)
ANTICOAGULATION CONSULT NOTE   Pharmacy Consult for Heparin Indication: pulmonary embolus  Allergies  Allergen Reactions   Melatonin Anxiety   Altace [Ramipril] Cough    Patient Measurements: Height: 5\' 8"  (172.7 cm) Weight: 111.7 kg (246 lb 4.1 oz) IBW/kg (Calculated) : 68.4 Heparin Dosing Weight: 93.4 kg  Vital Signs: Temp: 97.5 F (36.4 C) (11/12 0438) Temp Source: Oral (11/12 0438) BP: 117/50 (11/12 0438) Pulse Rate: 76 (11/12 0438)  Labs: Recent Labs    01/22/22 1524 01/22/22 2241 01/23/22 0326 01/23/22 1753 01/24/22 0057 01/24/22 0757 01/24/22 1822 01/24/22 2356 01/25/22 0544  HGB 7.7*   < > 7.1* 7.1*  --  7.2*  --   --  8.1*  HCT 25.8*   < > 23.8* 24.2*  --  24.4*  --   --  27.5*  PLT  --   --  215  --   --  203  --   --  205  APTT  --   --   --  46*  --   --   --   --   --   LABPROT 22.8*  --  21.6* 19.0*  --   --   --   --   --   INR 2.0*  --  1.9* 1.6*  --   --   --   --   --   HEPARINUNFRC  --   --   --  <0.10*   < > 0.23* 0.31 0.21* 0.56  CREATININE  --   --  1.07  --   --  0.89  --   --  0.87   < > = values in this interval not displayed.     Estimated Creatinine Clearance: 101.2 mL/min (by C-G formula based on SCr of 0.87 mg/dL).   Medical History: Past Medical History:  Diagnosis Date   Antiphospholipid antibody syndrome [facility])    CAD S/P percutaneous coronary angioplasty '98, '04, Feb 2017   [facility]   Carotid arterial disease (HCC) 10/2015   moderate bilateral   COPD (chronic obstructive pulmonary disease) (HCC)    Coronary artery disease    Hypertension    Non-insulin treated type 2 diabetes mellitus (HCC)    Pulmonary emboli (HCC) 2011   RBBB    Sleep apnea     Medications:  Pt had PTA warfarin:  Last dose 01/20/22.  Assessment: Pharmacy consulted to dose heparin on 66 y.o. M admitted with antiphospholipid syndrome and hx of PE.  PTA warfarin held, INR now subtherapeutic (1.9). Possible antiphospholipid syndrome, pt reports  diagnosed > 10 years ago. Unclear if patient has antiphospholipid syndrome.   11/11:  HL @ 0057 = 0.28, SUBtherapeutic  11/11:  HL @ 0757 = 0.23, Subtherapeutic  11/11:  HL @ 2356 = 0.21, SUBtherapeutic 11/12: HL @ 0544 = 0.56, therapeutic   Goal of Therapy:  Heparin level 0.3-0.7 units/ml Monitor platelets by anticoagulation protocol: Yes   Plan:  Heparin level is therapeutic. Will continue heparin infusion at 2150 units/hr. Recheck heparin level in 6 hours. CBC daily while on heparin.   13/12, PharmD, BCPS 01/25/2022,7:21 AM

## 2022-01-25 NOTE — Progress Notes (Signed)
ANTICOAGULATION CONSULT NOTE   Pharmacy Consult for enoxaparin bridge and warfarin Indication: pulmonary embolus APS  Allergies  Allergen Reactions   Melatonin Anxiety   Altace [Ramipril] Cough    Patient Measurements: Height: 5\' 8"  (172.7 cm) Weight: 111.7 kg (246 lb 4.1 oz) IBW/kg (Calculated) : 68.4 Heparin Dosing Weight: 93.4 kg  Vital Signs: Temp: 98 F (36.7 C) (11/12 0808) Temp Source: Oral (11/12 0438) BP: 95/80 (11/12 0808) Pulse Rate: 75 (11/12 0808)  Labs: Recent Labs    01/22/22 1524 01/22/22 2241 01/23/22 0326 01/23/22 1753 01/24/22 0057 01/24/22 0757 01/24/22 1822 01/24/22 2356 01/25/22 0544  HGB 7.7*   < > 7.1* 7.1*  --  7.2*  --   --  8.1*  HCT 25.8*   < > 23.8* 24.2*  --  24.4*  --   --  27.5*  PLT  --   --  215  --   --  203  --   --  205  APTT  --   --   --  46*  --   --   --   --   --   LABPROT 22.8*  --  21.6* 19.0*  --   --   --   --   --   INR 2.0*  --  1.9* 1.6*  --   --   --   --   --   HEPARINUNFRC  --   --   --  <0.10*   < > 0.23* 0.31 0.21* 0.56  CREATININE  --   --  1.07  --   --  0.89  --   --  0.87   < > = values in this interval not displayed.     Estimated Creatinine Clearance: 101.2 mL/min (by C-G formula based on SCr of 0.87 mg/dL).   Medical History: Past Medical History:  Diagnosis Date   Antiphospholipid antibody syndrome [facility])    CAD S/P percutaneous coronary angioplasty '98, '04, Feb 2017   [facility]   Carotid arterial disease (HCC) 10/2015   moderate bilateral   COPD (chronic obstructive pulmonary disease) (HCC)    Coronary artery disease    Hypertension    Non-insulin treated type 2 diabetes mellitus (HCC)    Pulmonary emboli (HCC) 2011   RBBB    Sleep apnea     Medications:  Pt had PTA warfarin:  Last dose 01/20/22.  Assessment: Pharmacy consulted to dose heparin on 66 y.o. M admitted with antiphospholipid syndrome and hx of PE.  PTA warfarin held, INR now subtherapeutic (1.9). Possible antiphospholipid  syndrome, pt reports diagnosed > 10 years ago. Unclear if patient has antiphospholipid syndrome. Pt taking warfarin 5 mg daily per med rec.   11/11:  HL @ 0057 = 0.28, SUBtherapeutic  11/11:  HL @ 0757 = 0.23, Subtherapeutic  11/11:  HL @ 2356 = 0.21, SUBtherapeutic 11/12: HL @ 0544 = 0.56, therapeutic   Date INR Warfarin Dose  11/10 1.6 6 mg          Goal of Therapy:  Heparin level 0.3-0.7 units/ml Monitor platelets by anticoagulation protocol: Yes   Plan:  INR is subtherapeutic. Will give warfarin 6 mg x 1 (~20% increase from home dose). Daily INR ordered. Will start enoxaparin 1 mg/kg BID and bridge until INR > 2. CBC at least every 3 days.     13/12, PharmD, BCPS 01/25/2022,8:29 AM

## 2022-01-25 NOTE — Progress Notes (Signed)
PROGRESS NOTE    Jose [name]  ONG:[id number] DOB: 12/23/1955 DOA: 01/22/2022 PCP: [name], MD   Brief Narrative:  66 y.o. 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 CXR-cardiomegaly with mild interstitial edema.  CT abdomen pelvis showed hematoma in the right gluteal subcutaneous fat without any evidence of active bleeding, bilateral nonobstructing renal stones.   Assessment & Plan:  Principal Problem:   Traumatic hematoma of buttock Active Problems:   ABLA (acute blood loss anemia)   History of pulmonary embolus (PE)-2011   Chronic anticoagulation-Couamdin   Essential hypertension, benign   COPD (chronic obstructive pulmonary disease) (HCC)   CAD S/P multiple PCIs   Type 2 diabetes mellitus with hyperlipidemia (HCC)   Chronic respiratory failure with hypoxia (HCC)   Recurrent syncope   APS (antiphospholipid syndrome) (HCC)    Traumatic hematoma of left buttock + Acute blood loss anemia : Baseline hemoglobin 9, admission hemoglobin around 7.5 and has been fluctuating CT scan consistent with hematoma but no active bleeding. S/p 1U PRBC, Hb is 8.1. Lovenox to coumadin bridge.   History of PE 2011 Hx of Antiphospholipid antibody syndrome  on Coumadin: Goal INR as low 2.0 due to recurrent Falls. Heme consulted, there is concerns about his antiphospholipid Dx. Repeat Blood work ordered, Possible consideration to transition him to  DOAC due to low risk Dx.  Discussed with Dr Orlie Dakin, ok to bridge lovenox to coumadin. Goal INR 2-3. He will follow up outpatient with the patient with the APS labs.   Vertebrobasilar insufficiency Recurrent syncope, longstanding history with previous workup and follow up: He has had previous multiple work ups- had  MRI/MRA head/neck in 2023, Echo in 2020 EF 55 to 60% G1 DD. mild to moderate aortic valve stenosis.Also had a Holter monitor placement previously. Fall precaution, supportive care.    Diabetes mellitus on glimepiride:Blood sugar well controlled on SSI.  Hold p.o. meds.  Continue home Neurontin  CAD with history of PCI stent, CABG X3 Currently chest pain free. On Home ASA/Statin.   Hypertension Chronic lower leg edema: No chest pain.  BP stable.  Cont his home asa, metoprolol, statin, torsemide.   COPD with chronic respiratory failure with hypoxia and hypercapnea on 4l [place] and Home Trilogy: Not in exacerbation.cont home Trelegy inhalers,nebulizer, Harvey o2 at 4l . On Trilogy bipap at home-ordered bedtime bipap here but he did not like it/mask issues.   GMW:NUUVOZDG home Flomax.   Class II Obesity:Patient's Body mass index is 37.44 kg/m. : Will benefit with PCP follow-up, weight loss and healthy lifestyle   DVT prophylaxis: Lovenox Code Status: Full Family Communication:    Status is: Inpatient Lovenox to coumadin bridge, monitor Hb. If stays up, he may be able to go home tom. Awaiting INR to become therapeutic.      Subjective: Feels ok no complaints.   Examination: Constitutional: Not in acute distress Respiratory: Clear to auscultation bilaterally Cardiovascular: Normal sinus rhythm, no rubs Abdomen: Nontender nondistended good bowel sounds Musculoskeletal: No edema noted Skin: right  gluteal hematoma/ecchymosis.  Neurologic: CN 2-12 grossly intact.  And nonfocal Psychiatric: Normal judgment and insight. Alert and oriented x 3. Normal mood.      Objective: Vitals:   01/24/22 1943 01/24/22 2147 01/25/22 0438 01/25/22 0808  BP: (!) 97/48 (!) 105/50 (!) 117/50 95/80  Pulse: 79  76 75  Resp: 18  20 16   Temp: 97.6 F (36.4 C)  (!) 97.5 F (36.4 C) 98 F (36.7 C)  TempSrc: Oral  Oral   SpO2: 100%  100% 100%  Weight:      Height:        Intake/Output Summary (Last 24 hours) at 01/25/2022 0816 Last data filed at 01/25/2022 [id number] Gross per 24 hour  Intake 1664.27 ml  Output 2350 ml  Net -685.73 ml   Filed Weights   01/21/22 2002 01/22/22 2120  Weight: 108.9 kg 111.7 kg     Data Reviewed:   CBC: Recent Labs  Lab 01/21/22 2005 01/22/22 0541 01/22/22 2241 01/23/22 0326 01/23/22 1753 01/24/22 0757 01/25/22 0544  WBC 12.0*  --   --  10.7*  --  11.4* 11.5*  HGB 7.8*   < > 7.8* 7.1* 7.1* 7.2* 8.1*  HCT 25.7*   < > 26.5* 23.8* 24.2* 24.4* 27.5*  MCV 94.1  --   --  94.4  --  94.9 93.5  PLT 232  --   --  215  --  203 205   < > = values in this interval not displayed.   Basic Metabolic Panel: Recent Labs  Lab 01/21/22 2005 01/23/22 0326 01/24/22 0757 01/25/22 0544  NA 142 143 142 142  K 3.5 3.7 4.3 4.1  CL 96* 96* 97* 100  CO2 38* 41* 39* 37*  GLUCOSE 82 164* 128* 122*  BUN 27* 27* 26* 24*  CREATININE 0.97 1.07 0.89 0.87  CALCIUM 8.8* 8.4* 8.5* 8.8*  MG  --   --   --  2.3   GFR: Estimated Creatinine Clearance: 101.2 mL/min (by C-G formula based on SCr of 0.87 mg/dL). Liver Function Tests: No results for input(s): "AST", "ALT", "ALKPHOS", "BILITOT", "PROT", "ALBUMIN" in the last 168 hours. No results for input(s): "LIPASE", "AMYLASE" in the last 168 hours. No results for input(s): "AMMONIA" in the last 168 hours. Coagulation Profile: Recent Labs  Lab 01/21/22 2005 01/22/22 1524 01/23/22 0326 01/23/22 1753  INR 2.2* 2.0* 1.9* 1.6*   Cardiac Enzymes: No results for input(s): "CKTOTAL", "CKMB", "CKMBINDEX", "TROPONINI" in the last 168 hours. BNP (last 3 results) No results for input(s):  "PROBNP" in the last 8760 hours. HbA1C: No results for input(s): "HGBA1C" in the last 72 hours. CBG: Recent Labs  Lab 01/23/22 2106 01/24/22 0757 01/24/22 1243 01/24/22 1738 01/24/22 2104  GLUCAP 117* 149* 156* 127* 140*   Lipid Profile: No results for input(s): "CHOL", "HDL", "LDLCALC", "TRIG", "CHOLHDL", "LDLDIRECT" in the last 72 hours. Thyroid Function Tests: No results for input(s): "TSH", "T4TOTAL", "FREET4", "T3FREE", "THYROIDAB" in the last 72 hours. Anemia Panel: No results for input(s): "VITAMINB12", "FOLATE", "FERRITIN", "TIBC", "IRON", "RETICCTPCT" in the last 72 hours. Sepsis Labs: No results for input(s): "PROCALCITON", "LATICACIDVEN" in the last 168 hours.  No results found for this or any previous visit (from the past 240 hour(s)).       Radiology Studies: [place] LT UPPER EXTREM LTD SOFT TISSUE NON VASCULAR  Result Date: 01/23/2022 CLINICAL DATA:  Left arm bruise/hematoma in the antecubital region. EXAM: ULTRASOUND LEFT UPPER EXTREMITY LIMITED TECHNIQUE: Ultrasound examination of the upper extremity  soft tissues was performed in the area of clinical concern. COMPARISON:  None Available. FINDINGS: No cystic lesion or soft tissue mass identified. There is likely some infiltrative edema in the left antecubital fossa for example on image 7 of series 1 -1, but no drainable collection. IMPRESSION: 1. No cystic lesion or soft tissue mass identified in the left antecubital fossa. There is likely some edema in the left antecubital fossa, but no drainable collection. Electronically Signed   By: Van Clines M.D.   On: 01/23/2022 17:45        Scheduled Meds:  aspirin EC  81 mg Oral Daily   DULoxetine  30 mg Oral Daily   DULoxetine  60 mg Oral Daily   fluticasone furoate-vilanterol  1 puff Inhalation Daily   And   umeclidinium bromide  1 puff Inhalation Daily   gabapentin  600 mg Oral TID   insulin aspart  0-15 Units Subcutaneous TID WC   insulin aspart  0-5 Units  Subcutaneous QHS   metoprolol tartrate  12.5 mg Oral BID   pantoprazole  40 mg Oral BID   rosuvastatin  10 mg Oral QPM   tamsulosin  0.8 mg Oral Daily   topiramate  75 mg Oral TID   torsemide  20 mg Oral Daily   Continuous Infusions:  heparin 2,150 Units/hr (01/25/22 0126)     LOS: 3 days   Time spent= 35 mins    Kirin Brandenburger Arsenio Loader, MD [facility]  If 7PM-7AM, please contact night-coverage  01/25/2022, 8:16 AM

## 2022-01-25 NOTE — Plan of Care (Signed)

## 2022-01-25 NOTE — Progress Notes (Signed)
01/25/2022  Subjective: No acute events overnight.  Patient reports that she still having discomfort in the right buttocks hematoma site.  He has been using a heating pad and he is wondering if the hematoma is spreading.  Vital signs: Temp:  [97.5 F (36.4 C)-98 F (36.7 C)] 97.8 F (36.6 C) (11/12 1624) Pulse Rate:  [75-79] 78 (11/12 1624) Resp:  [16-20] 18 (11/12 1624) BP: (95-117)/(48-80) 112/61 (11/12 1624) SpO2:  [100 %] 100 % (11/12 1624)   Intake/Output: 11/11 0701 - 11/12 0700 In: 1664.3 [P.O.:840; I.V.:534.3; Blood:290] Out: 2350 [Urine:2350] Last BM Date : 01/23/22  Physical Exam: Constitutional: No acute distress Skin: Right buttocks has a large hematoma with ecchymosis involving almost the entire buttocks area.  The central portion of this hematoma is now improving with the skin less purple looking.  There is an area in the lateral inferior portion that is now outside of the initial marking lines that had been placed.  However I think this is more blood from the hematoma diffusely through the tissues rather than new blood expanding through and enlarging.  Labs:  Recent Labs    01/24/22 0757 01/25/22 0544  WBC 11.4* 11.5*  HGB 7.2* 8.1*  HCT 24.4* 27.5*  PLT 203 205   Recent Labs    01/24/22 0757 01/25/22 0544  NA 142 142  K 4.3 4.1  CL 97* 100  CO2 39* 37*  GLUCOSE 128* 122*  BUN 26* 24*  CREATININE 0.89 0.87  CALCIUM 8.5* 8.8*   Recent Labs    01/23/22 0326 01/23/22 1753  LABPROT 21.6* 19.0*  INR 1.9* 1.6*    Imaging: No results found.  Assessment/Plan: This is a 66 y.o. male right buttocks large hematoma.  - Discussed with the patient that I think is not that the hematoma itself is expanding but that the blood that has been lost is diffusely through the tissues particularly with him being sitting on a dependent fashion.  This area of spread is in the lateral inferior portion.  The central aspect of the hematoma is clearing and so I think  this is truly an improvement rather than worsening. - For now, continue current conservative management. - Okay to continue anticoagulation.   I spent 35 minutes dedicated to the care of this patient on the date of this encounter to include pre-visit review of records, face-to-face time with the patient discussing diagnosis and management, and any post-visit coordination of care.  Howie Ill, MD [facility]

## 2022-01-26 ENCOUNTER — Other Ambulatory Visit: Payer: Self-pay

## 2022-01-26 ENCOUNTER — Other Ambulatory Visit (HOSPITAL_COMMUNITY): Payer: Self-pay

## 2022-01-26 DIAGNOSIS — S300XXA Contusion of lower back and pelvis, initial encounter: Secondary | ICD-10-CM | POA: Diagnosis not present

## 2022-01-26 LAB — GLUCOSE, CAPILLARY
Glucose-Capillary: 146 mg/dL — ABNORMAL HIGH (ref 70–99)
Glucose-Capillary: 146 mg/dL — ABNORMAL HIGH (ref 70–99)

## 2022-01-26 LAB — CBC
HCT: 26 % — ABNORMAL LOW (ref 39.0–52.0)
Hemoglobin: 7.6 g/dL — ABNORMAL LOW (ref 13.0–17.0)
MCH: 27.5 pg (ref 26.0–34.0)
MCHC: 29.2 g/dL — ABNORMAL LOW (ref 30.0–36.0)
MCV: 94.2 fL (ref 80.0–100.0)
Platelets: 224 10*3/uL (ref 150–400)
RBC: 2.76 MIL/uL — ABNORMAL LOW (ref 4.22–5.81)
RDW: 16.2 % — ABNORMAL HIGH (ref 11.5–15.5)
WBC: 10.2 10*3/uL (ref 4.0–10.5)
nRBC: 0.2 % (ref 0.0–0.2)

## 2022-01-26 LAB — BASIC METABOLIC PANEL
Anion gap: 6 (ref 5–15)
BUN: 19 mg/dL (ref 8–23)
CO2: 36 mmol/L — ABNORMAL HIGH (ref 22–32)
Calcium: 9.4 mg/dL (ref 8.9–10.3)
Chloride: 100 mmol/L (ref 98–111)
Creatinine, Ser: 0.84 mg/dL (ref 0.61–1.24)
GFR, Estimated: >60 mL/min (ref >60)
Glucose, Bld: 153 mg/dL — ABNORMAL HIGH (ref 70–99)
Potassium: 4.2 mmol/L (ref 3.5–5.1)
Sodium: 142 mmol/L (ref 135–145)

## 2022-01-26 LAB — DRVVT MIX: dRVVT Mix: 62.2 s — ABNORMAL HIGH (ref 0.0–40.4)

## 2022-01-26 LAB — DRVVT CONFIRM: dRVVT Confirm: 2 ratio — ABNORMAL HIGH (ref 0.8–1.2)

## 2022-01-26 LAB — BETA-2-GLYCOPROTEIN I ABS, IGG/M/A
Beta-2 Glyco I IgG: 14 GPI IgG units (ref 0–20)
Beta-2-Glycoprotein I IgA: <9 GPI IgA units (ref 0–25)
Beta-2-Glycoprotein I IgM: <9 GPI IgM units (ref 0–32)

## 2022-01-26 LAB — LUPUS ANTICOAGULANT PANEL
DRVVT: 115.1 s — ABNORMAL HIGH (ref 0.0–47.0)
PTT Lupus Anticoagulant: 52.1 s — ABNORMAL HIGH (ref 0.0–43.5)

## 2022-01-26 LAB — HEXAGONAL PHASE PHOSPHOLIPID: Hexagonal Phase Phospholipid: 46 s — ABNORMAL HIGH (ref 0–11)

## 2022-01-26 LAB — MAGNESIUM: Magnesium: 2.2 mg/dL (ref 1.7–2.4)

## 2022-01-26 LAB — PROTIME-INR
INR: 1.3 — ABNORMAL HIGH (ref 0.8–1.2)
Prothrombin Time: 15.8 seconds — ABNORMAL HIGH (ref 11.4–15.2)

## 2022-01-26 LAB — PTT-LA MIX: PTT-LA Mix: 44.4 s — ABNORMAL HIGH (ref 0.0–40.5)

## 2022-01-26 MED ORDER — WARFARIN SODIUM 7.5 MG PO TABS: 7.5000 mg | ORAL_TABLET | ORAL | Status: DC

## 2022-01-26 MED ORDER — ENOXAPARIN SODIUM 120 MG/0.8ML IJ SOSY: 1.0000 mg/kg | PREFILLED_SYRINGE | Freq: Two times a day (BID) | INTRAMUSCULAR | 0 refills | Status: DC

## 2022-01-26 MED ORDER — SENNOSIDES-DOCUSATE SODIUM 8.6-50 MG PO TABS: 1.0000 | ORAL_TABLET | Freq: Two times a day (BID) | ORAL | 0 refills | Status: DC | PRN

## 2022-01-26 MED ORDER — HYDROCODONE-ACETAMINOPHEN 5-325 MG PO TABS: 1.0000 | ORAL_TABLET | Freq: Four times a day (QID) | ORAL | 0 refills | Status: AC | PRN

## 2022-01-26 NOTE — Progress Notes (Signed)
Patient discharged to home via self/private transfer to home with all belongings. AVS and medications reviewed with patient and he expressed full understanding. Medications sent to [facility] pharmacy and patient is notified. PIVX2 removed with cathter intact. No complaints or concerns at the time of discharge.

## 2022-01-26 NOTE — Discharge Summary (Signed)
Physician Discharge Summary  Jose [name] MVH:[id number] DOB: Jan 23, 1956 DOA: 01/22/2022  PCP: [name], MD  Admit date: 01/22/2022 Discharge date: 01/26/2022  Admitted From: Home Disposition: Home  Recommendations for Outpatient Follow-up:  Follow up with PCP in 1-2 weeks Please obtain CBC in next 3-5 days Follow-up outpatient hematology in the next 3-5 days.  To be arranged by their service Lovenox to Coumadin bridge.  INR goal 2-3 0. repeat check with hematology   Discharge Condition: Stable CODE STATUS: Full code Diet recommendation: Diabetic  Brief/Interim Summary: 66 y.o. 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 CXR-cardiomegaly with mild interstitial edema.  CT abdomen pelvis showed hematoma in the right gluteal subcutaneous fat without any evidence of active bleeding, bilateral nonobstructing renal stones. During the hospitalization patient was placed on heparin drip initially, oncology team was consulted.  Routine lab work for antiphospholipid syndrome was sent.  Eventually hemoglobin remained stable but he did require 1 unit of PRBC transfusion.  He was also seen by general surgery who recommended conservative management.  Eventually he was transitioned back to Coumadin with Lovenox bridge. Today he is medically stable for discharge with recommendations as stated above.  He will have outpatient hematology follow-up with Dr.  Sueanne Margarita.  I discussed case with him during the hospital stay and on the day of discharge.     Assessment & Plan:  Principal Problem:   Traumatic hematoma of buttock Active Problems:   ABLA (acute blood loss anemia)   History of pulmonary embolus (PE)-2011   Chronic anticoagulation-Couamdin   Essential hypertension, benign   COPD (chronic obstructive pulmonary disease) (HCC)   CAD S/P multiple PCIs   Type 2 diabetes mellitus with hyperlipidemia (HCC)   Chronic respiratory failure with hypoxia (HCC)   Recurrent syncope   APS (antiphospholipid syndrome) (HCC)    Traumatic hematoma of left buttock + Acute blood loss anemia : Baseline hemoglobin 9, admission hemoglobin around 7.5 and has been fluctuating CT scan consistent with hematoma but no active bleeding.  Received 1 unit PRBC in the hospital.  Hemoglobin today 7.6.  He will get repeat CBC outpatient with hematology next 3-4 days.  He is also seen by general surgery, no further recommendations at this time.   History of PE 2011 Hx of Antiphospholipid antibody syndrome  on Coumadin: Goal INR as low 2.0 due to recurrent Falls. Heme consulted, there is concerns about his antiphospholipid Dx. Repeat Blood work ordered, Possible consideration to transition him to DOAC due to low risk Dx.  Discussed with Dr Orlie Dakin, ok to bridge lovenox to coumadin. Goal INR 2-3. He will follow up outpatient with the patient with the APS labs.  He is arranging for this towards the end of this week.  Appreciate hematology assistance   Vertebrobasilar insufficiency Recurrent syncope, longstanding history with previous workup and follow up: He has had previous multiple work ups- had  MRI/MRA head/neck in 2023, Echo in 2020 EF 55 to 60% G1 DD. mild to moderate aortic valve stenosis.Also had a  Holter monitor placement previously. Fall precaution, supportive care.    Diabetes mellitus on glimepiride:Blood sugar well controlled on SSI.  Resume home regimen    CAD with history of PCI stent, CABG X3 Currently chest pain free. On Home ASA/Statin.    Hypertension Chronic lower leg edema: No chest pain.  BP stable.  Cont his home asa, metoprolol, statin, torsemide.   COPD with chronic respiratory failure with hypoxia and hypercapnea on 4l [place] and Home Trilogy: Not in exacerbation.cont home Trelegy inhalers,nebulizer, [place] o2 at 4l . On Trilogy bipap at home-ordered bedtime bipap here but he did not like it/mask issues.   ZOX:[id number]:continue home Flomax.   Class II Obesity:Patient's Body mass index is 37.44 kg/m. : Will benefit with PCP follow-up, weight loss and healthy lifestyle        Discharge Diagnoses:  Principal Problem:   Traumatic hematoma of buttock Active Problems:   ABLA (acute blood loss anemia)   History of pulmonary embolus (PE)-2011   Chronic anticoagulation-Couamdin   Essential hypertension, benign   COPD (chronic obstructive pulmonary disease) (HCC)   CAD S/P multiple PCIs   Type 2 diabetes mellitus with hyperlipidemia (HCC)   Chronic respiratory failure with hypoxia (HCC)   Recurrent syncope   APS (antiphospholipid syndrome) (HCC)      Consultations: Hematology General surgery  Subjective: Patient feels great, no complaints.  He wishes to go home.  He understands that he will need to be on Lovenox to Coumadin bridge.  He also knows home to get his INR and CBC checked with hematology office before the end of this week.  Discharge Exam: Vitals:   01/26/22 0435 01/26/22 0758  BP: 119/60 139/61  Pulse: 81 82  Resp: 18 16  Temp: 97.7 F (36.5 C) 97.7 F (36.5 C)  SpO2: 98% 100%   Vitals:   01/25/22 1941 01/25/22 1951 01/26/22 0435 01/26/22 0758  BP:  (!) 123/59 119/60 139/61  Pulse:  79 81 82  Resp:  20 18 16   Temp:  98 F (36.7 C) 97.7 F (36.5 C) 97.7 F (36.5 C)  TempSrc:  Oral Oral Oral  SpO2: 100% 100% 98% 100%  Weight:      Height:        General: Pt is alert, awake, not in acute  distress Cardiovascular: RRR, S1/S2 +, no rubs, no gallops Respiratory: CTA bilaterally, no wheezing, no rhonchi Abdominal: Soft, NT, ND, bowel sounds + Extremities: no edema, no cyanosis Has gluteal ecchymosis on the right side  Discharge Instructions   Allergies as of 01/26/2022       Reactions   Melatonin Anxiety   Altace [ramipril] Cough        Medication List     TAKE these medications    acetaminophen 650 MG CR tablet Commonly known as: TYLENOL Take 1,950 mg by mouth every 8 (eight) hours as needed for pain.   albuterol 108 (90 Base) MCG/ACT inhaler Commonly known as: VENTOLIN HFA Inhale 2 puffs into the lungs every 6 (six) hours as needed for wheezing or shortness of breath.   albuterol (5 MG/ML) 0.5% nebulizer solution Commonly known as: PROVENTIL Take 0.5 mLs (2.5 mg total) by nebulization every 6 (six) hours as needed for wheezing or shortness of breath.   aspirin EC 81 MG tablet Take 1 tablet (81 mg total) by mouth daily. Swallow whole.   DULoxetine 60 MG capsule Commonly known as: CYMBALTA Take 60 mg by mouth daily. Take one capsule (60 mg) along with 30  mg capsule to equal 90 mg daily   DULoxetine 30 MG capsule Commonly known as: CYMBALTA Take 30 mg by mouth daily. Take one capsule (30 mg) daily along with 60 mg capsule to equal 90 mg daily   enoxaparin 120 MG/0.8ML injection Commonly known as: LOVENOX Inject 0.74 mLs (111 mg total) into the skin every 12 (twelve) hours for 4 days.   gabapentin 600 MG tablet Commonly known as: NEURONTIN Take 600 mg by mouth 3 (three) times daily.   glimepiride 4 MG tablet Commonly known as: AMARYL Take 4 mg by mouth 2 (two) times daily.   HYDROcodone-acetaminophen 5-325 MG tablet Commonly known as: NORCO/VICODIN Take 1-2 tablets by mouth every 6 (six) hours as needed for up to 5 days for moderate pain or severe pain.   ipratropium-albuterol 0.5-2.5 (3) MG/3ML Soln Commonly known as: DUONEB Take 3 mLs by  nebulization every 4 (four) hours as needed (For shortness of breath or wheezing).   metoprolol tartrate 12.5 mg Tabs tablet Commonly known as: LOPRESSOR Take 12.5 mg by mouth 2 (two) times daily.   nitroGLYCERIN 0.4 MG SL tablet Commonly known as: NITROSTAT Place 0.4 mg under the tongue every 5 (five) minutes as needed for chest pain.   OXYGEN Inhale 4-6 L into the lungs daily.   pantoprazole 40 MG tablet Commonly known as: PROTONIX Take 40 mg by mouth 2 (two) times daily.   rosuvastatin 10 MG tablet Commonly known as: CRESTOR Take 10 mg by mouth every evening.   senna-docusate 8.6-50 MG tablet Commonly known as: Senokot-S Take 1 tablet by mouth 2 (two) times daily as needed for moderate constipation.   tamsulosin 0.4 MG Caps capsule Commonly known as: FLOMAX Take 0.8 mg by mouth daily.   topiramate 50 MG tablet Commonly known as: TOPAMAX Take 75 mg by mouth in the morning, at noon, and at bedtime.   torsemide 20 MG tablet Commonly known as: DEMADEX Take 20 mg by mouth daily.   Trelegy Ellipta 200-62.5-25 MCG/ACT Aepb Generic drug: Fluticasone-Umeclidin-Vilant Inhale 1 puff into the lungs daily.   warfarin 7.5 MG tablet Commonly known as: COUMADIN Take 1 tablet (7.5 mg total) by mouth See admin instructions. Take one tablet (5 mg) Tuesday, Wednesday, Thursday, Saturday and Sunday Then take (6 mg) Monday and Friday  Currently wanting to do 5mg  everyday to maintain phospholipid syndrome. What changed:  medication strength how much to take        Follow-up Information     [facility] Follow up.   Why: They will restart home health services at discharge. Physical therapy and nursing.               Allergies  Allergen Reactions   Melatonin Anxiety   Altace [Ramipril] Cough    You were cared for by a hospitalist during your hospital stay. If you have any questions about your discharge medications or the care you received while you were in the  hospital after you are discharged, you can call the unit and asked to speak with the hospitalist on call if the hospitalist that took care of you is not available. Once you are discharged, your primary care physician will handle any further medical issues. Please note that no refills for any discharge medications will be authorized once you are discharged, as it is imperative that you return to your primary care physician (or establish a relationship with a primary care physician if you do not have one) for your aftercare needs so that  they can reassess your need for medications and monitor your lab values.   Procedures/Studies: [place] LT UPPER EXTREM LTD SOFT TISSUE NON VASCULAR  Result Date: 01/23/2022 CLINICAL DATA:  Left arm bruise/hematoma in the antecubital region. EXAM: ULTRASOUND LEFT UPPER EXTREMITY LIMITED TECHNIQUE: Ultrasound examination of the upper extremity soft tissues was performed in the area of clinical concern. COMPARISON:  None Available. FINDINGS: No cystic lesion or soft tissue mass identified. There is likely some infiltrative edema in the left antecubital fossa for example on image 7 of series 1 -1, but no drainable collection. IMPRESSION: 1. No cystic lesion or soft tissue mass identified in the left antecubital fossa. There is likely some edema in the left antecubital fossa, but no drainable collection. Electronically Signed   By: Gaylyn Rong M.D.   On: 01/23/2022 17:45   CT Angio Abd/Pel W and/or Wo Contrast  Result Date: 01/22/2022 CLINICAL DATA:  Hematoma in right buttock on Coumadin EXAM: CTA ABDOMEN AND PELVIS WITHOUT AND WITH CONTRAST TECHNIQUE: Multidetector CT imaging of the abdomen and pelvis was performed using the standard protocol during bolus administration of intravenous contrast. Multiplanar reconstructed images and MIPs were obtained and reviewed to evaluate the vascular anatomy. RADIATION DOSE REDUCTION: This exam was performed according to the departmental  dose-optimization program which includes automated exposure control, adjustment of the mA and/or kV according to patient size and/or use of iterative reconstruction technique. CONTRAST:  OMNIPAQUE IOHEXOL 350 MG/ML SOLN COMPARISON:  CT abdomen and pelvis 01/20/2020 FINDINGS: VASCULAR Aorta: Calcified athero sclerotic plaque without significant narrowing. No aneurysm or dissection. Celiac: Atherosclerotic plaque at the origin causes moderate narrowing. No aneurysm or dissection. Normal variant origin of the left gastric directly from the aorta. SMA: Patent without evidence of aneurysm, dissection, vasculitis or significant stenosis. Renals: Both renal arteries are patent without evidence of aneurysm, dissection, vasculitis, fibromuscular dysplasia or significant stenosis. IMA: Patent without evidence of aneurysm, dissection, vasculitis or significant stenosis. 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 Calcified athero sclerotic plaque in both common, internal, and external iliac arteries. No dissection. Proximal Outflow: Bilateral common femoral and visualized portions of the superficial and profunda femoral arteries are patent without evidence of aneurysm, dissection, vasculitis or significant stenosis. Veins: Unremarkable. Review of the MIP images confirms the above findings. NON-VASCULAR Lower chest: Bibasilar scarring/atelectasis.  No acute abnormality. Hepatobiliary: Hepatic steatosis. No focal liver abnormality is seen. No gallstones, gallbladder wall thickening, or biliary dilatation. Pancreas: Unremarkable. No pancreatic ductal dilatation or surrounding inflammatory changes. Spleen: Normal in size without focal abnormality. Adrenals/Urinary Tract: Unremarkable adrenal glands. Bilateral nephrolithiasis measuring up to 1.0 cm right. No hydronephrosis or obstructing urinary calculi. Unremarkable bladder. Stomach/Bowel: Normal caliber large and small bowel. Moderate colonic stool  burden. Unremarkable stomach. Lymphatic: Unchanged bilateral external iliac lymphadenopathy measuring up to 1.4 cm on the right. Reproductive: Unremarkable. Other: No free intraperitoneal fluid or air. Musculoskeletal: Edema or contusion about the bilateral posterior gluteal subcutaneous fat. Within the right gluteal subcutaneous fat there is a hyperdense fluid collection measuring approximately 9.3 by 3.7 cm. No evidence of active contrast extravasation. This does not change in density between phases. Thoracolumbar spondylosis. No acute osseous abnormality. IMPRESSION: VASCULAR Hematoma in the right gluteal subcutaneous fat. No evidence of active bleeding. No acute aortic syndrome.  Aortic Atherosclerosis (ICD10-I70.0). 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 NON-VASCULAR No acute abnormality in the abdomen or pelvis. Bilateral nonobstructing nephrolithiasis measuring up to 1.0 cm on the right. Hepatic steatosis.  Electronically Signed   By: Minerva Fester M.D.   On: 01/22/2022 03:51   DG Chest 1 View  Result Date: 01/21/2022 CLINICAL DATA:  Loss of consciousness and low oxygen saturation; history of COPD EXAM: CHEST  1 VIEW COMPARISON:  Radiograph 11/08/2018 FINDINGS: Stable cardiomegaly.  Sternotomy and CABG. Increased interstitial coarsening compared with 11/07/2021. Bibasilar atelectasis. Question trace bilateral pleural effusions. No pneumothorax. No acute osseous abnormality. IMPRESSION: Cardiomegaly with possible mild interstitial edema. Question trace bilateral pleural effusions. Electronically Signed   By: Minerva Fester M.D.   On: 01/21/2022 20:38     The results of significant diagnostics from this hospitalization (including imaging, microbiology, ancillary and laboratory) are listed below for reference.     Microbiology: No results found for this or any previous visit (from the past 240 hour(s)).   Labs: BNP (last 3 results) Recent Labs    11/05/21 2043 01/21/22 2005   BNP 292.6* 87.4   Basic Metabolic Panel: Recent Labs  Lab 01/21/22 2005 01/23/22 0326 01/24/22 0757 01/25/22 0544 01/26/22 0442  NA 142 143 142 142 142  K 3.5 3.7 4.3 4.1 4.2  CL 96* 96* 97* 100 100  CO2 38* 41* 39* 37* 36*  GLUCOSE 82 164* 128* 122* 153*  BUN 27* 27* 26* 24* 19  CREATININE 0.97 1.07 0.89 0.87 0.84  CALCIUM 8.8* 8.4* 8.5* 8.8* 9.4  MG  --   --   --  2.3 2.2   Liver Function Tests: No results for input(s): "AST", "ALT", "ALKPHOS", "BILITOT", "PROT", "ALBUMIN" in the last 168 hours. No results for input(s): "LIPASE", "AMYLASE" in the last 168 hours. No results for input(s): "AMMONIA" in the last 168 hours. CBC: Recent Labs  Lab 01/21/22 2005 01/22/22 0541 01/23/22 0326 01/23/22 1753 01/24/22 0757 01/25/22 0544 01/26/22 0442  WBC 12.0*  --  10.7*  --  11.4* 11.5* 10.2  HGB 7.8*   < > 7.1* 7.1* 7.2* 8.1* 7.6*  HCT 25.7*   < > 23.8* 24.2* 24.4* 27.5* 26.0*  MCV 94.1  --  94.4  --  94.9 93.5 94.2  PLT 232  --  215  --  203 205 224   < > = values in this interval not displayed.   Cardiac Enzymes: No results for input(s): "CKTOTAL", "CKMB", "CKMBINDEX", "TROPONINI" in the last 168 hours. BNP: Invalid input(s): "POCBNP" CBG: Recent Labs  Lab 01/25/22 0825 01/25/22 1239 01/25/22 1627 01/25/22 2021 01/26/22 0759  GLUCAP 128* 141* 120* 138* 146*   D-Dimer No results for input(s): "DDIMER" in the last 72 hours. Hgb A1c No results for input(s): "HGBA1C" in the last 72 hours. Lipid Profile No results for input(s): "CHOL", "HDL", "LDLCALC", "TRIG", "CHOLHDL", "LDLDIRECT" in the last 72 hours. Thyroid function studies No results for input(s): "TSH", "T4TOTAL", "T3FREE", "THYROIDAB" in the last 72 hours.  Invalid input(s): "FREET3" Anemia work up No results for input(s): "VITAMINB12", "FOLATE", "FERRITIN", "TIBC", "IRON", "RETICCTPCT" in the last 72 hours. Urinalysis    Component Value Date/Time   COLORURINE YELLOW 11/05/2021 2031   APPEARANCEUR  CLEAR 11/05/2021 2031   LABSPEC 1.017 11/05/2021 2031   PHURINE 5.0 11/05/2021 2031   GLUCOSEU NEGATIVE 11/05/2021 2031   HGBUR NEGATIVE 11/05/2021 2031   BILIRUBINUR NEGATIVE 11/05/2021 2031   KETONESUR NEGATIVE 11/05/2021 2031   PROTEINUR NEGATIVE 11/05/2021 2031   NITRITE NEGATIVE 11/05/2021 2031   LEUKOCYTESUR NEGATIVE 11/05/2021 2031   Sepsis Labs Recent Labs  Lab 01/23/22 0326 01/24/22 0757 01/25/22 0544 01/26/22 0442  WBC 10.7* 11.4*  11.5* 10.2   Microbiology No results found for this or any previous visit (from the past 240 hour(s)).   Time coordinating discharge:  I have spent 35 minutes face to face with the patient and on the ward discussing the patients care, assessment, plan and disposition with other care givers. >50% of the time was devoted counseling the patient about the risks and benefits of treatment/Discharge disposition and coordinating care.   SIGNED:   Dimple Nanas, MD  [facility] 01/26/2022, 11:20 AM   If 7PM-7AM, please contact night-coverage

## 2022-01-26 NOTE — Plan of Care (Signed)
  Problem: Education: Goal: Ability to describe self-care measures that may prevent or decrease complications (Diabetes Survival Skills Education) will improve Outcome: Progressing Goal: Individualized Educational Video(s) Outcome: Progressing   Problem: Coping: Goal: Ability to adjust to condition or change in health will improve Outcome: Progressing   Problem: Coping: Goal: Ability to adjust to condition or change in health will improve Outcome: Progressing   Problem: Fluid Volume: Goal: Ability to maintain a balanced intake and output will improve Outcome: Progressing   Problem: Health Behavior/Discharge Planning: Goal: Ability to identify and utilize available resources and services will improve Outcome: Progressing Goal: Ability to manage health-related needs will improve Outcome: Progressing

## 2022-01-26 NOTE — Care Management Important Message (Signed)
Important Message  Patient Details  Name: Jose [name] MRN: [id number] Date of Birth: 1956/02/14   Medicare Important Message Given:  Yes     Johnell Comings 01/26/2022, 12:10 PM

## 2022-01-26 NOTE — TOC Benefit Eligibility Note (Signed)
Patient Product/process development scientist completed.    The patient is currently admitted and upon discharge could be taking enoxaparin (Lovenox) 120 mg/0.8 ml.  The current 10 day co-pay is $186.66 due to a $150.00 deductible remaining.   The patient is insured through [facility] Medicare Part D     Roland Earl, CPhT Pharmacy Patient Advocate Specialist [facility] Patient Advocate Team Direct Number: [phone]  Fax: [phone]

## 2022-01-26 NOTE — Plan of Care (Signed)
  Problem: Education: Goal: Ability to describe self-care measures that may prevent or decrease complications (Diabetes Survival Skills Education) will improve 01/26/2022 1403 by Evelena Peat, RN Outcome: Completed/Met 01/26/2022 1050 by Evelena Peat, RN Outcome: Progressing Goal: Individualized Educational Video(s) 01/26/2022 1403 by Evelena Peat, RN Outcome: Completed/Met 01/26/2022 1050 by Evelena Peat, RN Outcome: Progressing   Problem: Coping: Goal: Ability to adjust to condition or change in health will improve 01/26/2022 1403 by Evelena Peat, RN Outcome: Completed/Met 01/26/2022 1050 by Evelena Peat, RN Outcome: Progressing   Problem: Fluid Volume: Goal: Ability to maintain a balanced intake and output will improve 01/26/2022 1403 by Evelena Peat, RN Outcome: Completed/Met 01/26/2022 1050 by Evelena Peat, RN Outcome: Progressing   Problem: Health Behavior/Discharge Planning: Goal: Ability to identify and utilize available resources and services will improve 01/26/2022 1403 by Evelena Peat, RN Outcome: Completed/Met 01/26/2022 1050 by Evelena Peat, RN Outcome: Progressing Goal: Ability to manage health-related needs will improve 01/26/2022 1403 by Evelena Peat, RN Outcome: Completed/Met 01/26/2022 1050 by Evelena Peat, RN Outcome: Progressing   Problem: Metabolic: Goal: Ability to maintain appropriate glucose levels will improve 01/26/2022 1403 by Evelena Peat, RN Outcome: Completed/Met 01/26/2022 1050 by Evelena Peat, RN Outcome: Progressing   Problem: Skin Integrity: Goal: Risk for impaired skin integrity will decrease 01/26/2022 1403 by Evelena Peat, RN Outcome: Completed/Met 01/26/2022 1050 by Evelena Peat, RN Outcome: Progressing   Problem: Education: Goal: Knowledge of General Education information will improve Description: Including pain rating scale,  medication(s)/side effects and non-pharmacologic comfort measures 01/26/2022 1403 by Evelena Peat, RN Outcome: Completed/Met 01/26/2022 1050 by Evelena Peat, RN Outcome: Progressing   Problem: Health Behavior/Discharge Planning: Goal: Ability to manage health-related needs will improve 01/26/2022 1403 by Evelena Peat, RN Outcome: Completed/Met 01/26/2022 1050 by Evelena Peat, RN Outcome: Progressing

## 2022-01-26 NOTE — TOC Transition Note (Signed)
Transition of Care [facility]) - CM/SW Discharge Note   Patient Details  Name: Jose [name] MRN: [id number] Date of Birth: 07-14-1955  [facility] CM/SW Contact:  Margarito Liner, LCSW Phone Number: 01/26/2022, 9:52 AM   Clinical Narrative:  Patient has orders to discharge home today. Left message for [facility] liaison to notify. No further concerns. CSW signing off.   Final next level of care: Home w Home Health Services Barriers to Discharge: Barriers Resolved   Patient Goals and CMS Choice     Choice offered to / list presented to : Patient  Discharge Placement                Patient to be transferred to facility by: Wife   Patient and family notified of of transfer: 01/26/22  Discharge Plan and Services     Post Acute Care Choice: Home Health                    HH Arranged: RN, PT [facility] Agency: [facility] Date [facility] Agency Contacted: 01/26/22   Representative spoke with at [facility] Agency: Feliberto Gottron  Social Determinants of Health (SDOH) Interventions     Readmission Risk Interventions    01/23/2022   11:20 AM 07/16/2020   12:39 PM  Readmission Risk Prevention Plan  Transportation Screening Complete Complete  PCP or Specialist Appt within 3-5 Days Complete Complete  HRI or Home Care Consult Complete Complete  Social Work Consult for Recovery Care Planning/Counseling Complete Complete  Palliative Care Screening Not Applicable Not Applicable  Medication Review Oceanographer) Complete Complete

## 2022-01-27 DIAGNOSIS — J449 Chronic obstructive pulmonary disease, unspecified: Secondary | ICD-10-CM | POA: Diagnosis not present

## 2022-01-27 DIAGNOSIS — E1169 Type 2 diabetes mellitus with other specified complication: Secondary | ICD-10-CM | POA: Diagnosis not present

## 2022-01-27 DIAGNOSIS — G450 Vertebro-basilar artery syndrome: Secondary | ICD-10-CM | POA: Diagnosis not present

## 2022-01-27 DIAGNOSIS — R600 Localized edema: Secondary | ICD-10-CM | POA: Diagnosis not present

## 2022-01-27 DIAGNOSIS — D62 Acute posthemorrhagic anemia: Secondary | ICD-10-CM | POA: Diagnosis not present

## 2022-01-27 DIAGNOSIS — S300XXD Contusion of lower back and pelvis, subsequent encounter: Secondary | ICD-10-CM | POA: Diagnosis not present

## 2022-01-27 DIAGNOSIS — E785 Hyperlipidemia, unspecified: Secondary | ICD-10-CM | POA: Diagnosis not present

## 2022-01-27 DIAGNOSIS — I2510 Atherosclerotic heart disease of native coronary artery without angina pectoris: Secondary | ICD-10-CM | POA: Diagnosis not present

## 2022-01-27 DIAGNOSIS — J9612 Chronic respiratory failure with hypercapnia: Secondary | ICD-10-CM | POA: Diagnosis not present

## 2022-01-27 DIAGNOSIS — E1151 Type 2 diabetes mellitus with diabetic peripheral angiopathy without gangrene: Secondary | ICD-10-CM | POA: Diagnosis not present

## 2022-01-27 DIAGNOSIS — E669 Obesity, unspecified: Secondary | ICD-10-CM | POA: Diagnosis not present

## 2022-01-27 DIAGNOSIS — D6861 Antiphospholipid syndrome: Secondary | ICD-10-CM | POA: Diagnosis not present

## 2022-01-27 DIAGNOSIS — I10 Essential (primary) hypertension: Secondary | ICD-10-CM | POA: Diagnosis not present

## 2022-01-27 DIAGNOSIS — J9611 Chronic respiratory failure with hypoxia: Secondary | ICD-10-CM | POA: Diagnosis not present

## 2022-01-27 DIAGNOSIS — N400 Enlarged prostate without lower urinary tract symptoms: Secondary | ICD-10-CM | POA: Diagnosis not present

## 2022-01-27 DIAGNOSIS — I350 Nonrheumatic aortic (valve) stenosis: Secondary | ICD-10-CM | POA: Diagnosis not present

## 2022-01-27 LAB — CARDIOLIPIN ANTIBODIES, IGG, IGM, IGA
Anticardiolipin IgA: <9 APL U/mL (ref 0–11)
Anticardiolipin IgG: 26 GPL U/mL — ABNORMAL HIGH (ref 0–14)
Anticardiolipin IgM: <9 MPL U/mL (ref 0–12)

## 2022-01-28 ENCOUNTER — Other Ambulatory Visit: Payer: Self-pay | Admitting: *Deleted

## 2022-01-28 DIAGNOSIS — D649 Anemia, unspecified: Secondary | ICD-10-CM

## 2022-01-28 DIAGNOSIS — Z86711 Personal history of pulmonary embolism: Secondary | ICD-10-CM

## 2022-01-29 ENCOUNTER — Inpatient Hospital Stay: Payer: Medicare Other | Admitting: Oncology

## 2022-01-29 ENCOUNTER — Inpatient Hospital Stay: Payer: Medicare Other | Attending: Oncology

## 2022-02-02 ENCOUNTER — Inpatient Hospital Stay (HOSPITAL_COMMUNITY): Payer: Medicare Other

## 2022-02-02 ENCOUNTER — Inpatient Hospital Stay (HOSPITAL_COMMUNITY)
Admission: EM | Admit: 2022-02-02 | Discharge: 2022-02-06 | DRG: 208 | Disposition: A | Discharge status: Home Health | Payer: Medicare Other | Attending: Obstetrics and Gynecology | Admitting: Obstetrics and Gynecology

## 2022-02-02 ENCOUNTER — Emergency Department (HOSPITAL_COMMUNITY): Payer: Medicare Other

## 2022-02-02 ENCOUNTER — Encounter: Payer: Self-pay | Admitting: Critical Care Medicine

## 2022-02-02 ENCOUNTER — Encounter: Payer: Self-pay | Admitting: Neurology

## 2022-02-02 ENCOUNTER — Encounter (HOSPITAL_COMMUNITY): Payer: Self-pay | Admitting: Pulmonary Disease

## 2022-02-02 DIAGNOSIS — J440 Chronic obstructive pulmonary disease with acute lower respiratory infection: Secondary | ICD-10-CM | POA: Diagnosis present

## 2022-02-02 DIAGNOSIS — Z9911 Dependence on respirator [ventilator] status: Secondary | ICD-10-CM

## 2022-02-02 DIAGNOSIS — J690 Pneumonitis due to inhalation of food and vomit: Secondary | ICD-10-CM | POA: Diagnosis present

## 2022-02-02 DIAGNOSIS — I5032 Chronic diastolic (congestive) heart failure: Secondary | ICD-10-CM

## 2022-02-02 DIAGNOSIS — B9562 Methicillin resistant Staphylococcus aureus infection as the cause of diseases classified elsewhere: Secondary | ICD-10-CM | POA: Diagnosis present

## 2022-02-02 DIAGNOSIS — I6503 Occlusion and stenosis of bilateral vertebral arteries: Secondary | ICD-10-CM | POA: Diagnosis not present

## 2022-02-02 DIAGNOSIS — I2510 Atherosclerotic heart disease of native coronary artery without angina pectoris: Secondary | ICD-10-CM | POA: Diagnosis present

## 2022-02-02 DIAGNOSIS — S300XXA Contusion of lower back and pelvis, initial encounter: Secondary | ICD-10-CM | POA: Diagnosis present

## 2022-02-02 DIAGNOSIS — J9611 Chronic respiratory failure with hypoxia: Secondary | ICD-10-CM

## 2022-02-02 DIAGNOSIS — S199XXA Unspecified injury of neck, initial encounter: Secondary | ICD-10-CM | POA: Diagnosis not present

## 2022-02-02 DIAGNOSIS — J441 Chronic obstructive pulmonary disease with (acute) exacerbation: Secondary | ICD-10-CM | POA: Diagnosis present

## 2022-02-02 DIAGNOSIS — R296 Repeated falls: Secondary | ICD-10-CM | POA: Diagnosis present

## 2022-02-02 DIAGNOSIS — E669 Obesity, unspecified: Secondary | ICD-10-CM | POA: Diagnosis present

## 2022-02-02 DIAGNOSIS — R569 Unspecified convulsions: Secondary | ICD-10-CM | POA: Diagnosis not present

## 2022-02-02 DIAGNOSIS — R9431 Abnormal electrocardiogram [ECG] [EKG]: Secondary | ICD-10-CM | POA: Diagnosis not present

## 2022-02-02 DIAGNOSIS — Z4682 Encounter for fitting and adjustment of non-vascular catheter: Secondary | ICD-10-CM | POA: Diagnosis not present

## 2022-02-02 DIAGNOSIS — J9612 Chronic respiratory failure with hypercapnia: Secondary | ICD-10-CM

## 2022-02-02 DIAGNOSIS — Y92009 Unspecified place in unspecified non-institutional (private) residence as the place of occurrence of the external cause: Secondary | ICD-10-CM

## 2022-02-02 DIAGNOSIS — E1165 Type 2 diabetes mellitus with hyperglycemia: Secondary | ICD-10-CM | POA: Diagnosis present

## 2022-02-02 DIAGNOSIS — E1151 Type 2 diabetes mellitus with diabetic peripheral angiopathy without gangrene: Secondary | ICD-10-CM | POA: Diagnosis present

## 2022-02-02 DIAGNOSIS — D6862 Lupus anticoagulant syndrome: Secondary | ICD-10-CM | POA: Diagnosis present

## 2022-02-02 DIAGNOSIS — Z87891 Personal history of nicotine dependence: Secondary | ICD-10-CM | POA: Diagnosis not present

## 2022-02-02 DIAGNOSIS — Z951 Presence of aortocoronary bypass graft: Secondary | ICD-10-CM | POA: Diagnosis not present

## 2022-02-02 DIAGNOSIS — R404 Transient alteration of awareness: Secondary | ICD-10-CM

## 2022-02-02 DIAGNOSIS — D6861 Antiphospholipid syndrome: Secondary | ICD-10-CM | POA: Insufficient documentation

## 2022-02-02 DIAGNOSIS — Z8674 Personal history of sudden cardiac arrest: Secondary | ICD-10-CM

## 2022-02-02 DIAGNOSIS — W19XXXA Unspecified fall, initial encounter: Secondary | ICD-10-CM | POA: Diagnosis present

## 2022-02-02 DIAGNOSIS — G4733 Obstructive sleep apnea (adult) (pediatric): Secondary | ICD-10-CM | POA: Diagnosis present

## 2022-02-02 DIAGNOSIS — G9341 Metabolic encephalopathy: Secondary | ICD-10-CM | POA: Diagnosis present

## 2022-02-02 DIAGNOSIS — N179 Acute kidney failure, unspecified: Secondary | ICD-10-CM | POA: Diagnosis present

## 2022-02-02 DIAGNOSIS — I6523 Occlusion and stenosis of bilateral carotid arteries: Secondary | ICD-10-CM | POA: Diagnosis not present

## 2022-02-02 DIAGNOSIS — Z86711 Personal history of pulmonary embolism: Secondary | ICD-10-CM

## 2022-02-02 DIAGNOSIS — Z720 Tobacco use: Secondary | ICD-10-CM | POA: Insufficient documentation

## 2022-02-02 DIAGNOSIS — I5030 Unspecified diastolic (congestive) heart failure: Secondary | ICD-10-CM | POA: Insufficient documentation

## 2022-02-02 DIAGNOSIS — I4510 Unspecified right bundle-branch block: Secondary | ICD-10-CM | POA: Diagnosis present

## 2022-02-02 DIAGNOSIS — Z7984 Long term (current) use of oral hypoglycemic drugs: Secondary | ICD-10-CM

## 2022-02-02 DIAGNOSIS — N400 Enlarged prostate without lower urinary tract symptoms: Secondary | ICD-10-CM | POA: Diagnosis present

## 2022-02-02 DIAGNOSIS — R4182 Altered mental status, unspecified: Secondary | ICD-10-CM | POA: Diagnosis not present

## 2022-02-02 DIAGNOSIS — S3993XA Unspecified injury of pelvis, initial encounter: Secondary | ICD-10-CM | POA: Diagnosis not present

## 2022-02-02 DIAGNOSIS — I110 Hypertensive heart disease with heart failure: Secondary | ICD-10-CM | POA: Diagnosis present

## 2022-02-02 DIAGNOSIS — N200 Calculus of kidney: Secondary | ICD-10-CM | POA: Diagnosis not present

## 2022-02-02 DIAGNOSIS — I739 Peripheral vascular disease, unspecified: Secondary | ICD-10-CM | POA: Insufficient documentation

## 2022-02-02 DIAGNOSIS — R509 Fever, unspecified: Secondary | ICD-10-CM | POA: Diagnosis not present

## 2022-02-02 DIAGNOSIS — Z8249 Family history of ischemic heart disease and other diseases of the circulatory system: Secondary | ICD-10-CM

## 2022-02-02 DIAGNOSIS — J9621 Acute and chronic respiratory failure with hypoxia: Secondary | ICD-10-CM | POA: Diagnosis present

## 2022-02-02 DIAGNOSIS — E785 Hyperlipidemia, unspecified: Secondary | ICD-10-CM | POA: Diagnosis present

## 2022-02-02 DIAGNOSIS — I350 Nonrheumatic aortic (valve) stenosis: Secondary | ICD-10-CM | POA: Diagnosis present

## 2022-02-02 DIAGNOSIS — R000 Tachycardia, unspecified: Secondary | ICD-10-CM | POA: Diagnosis not present

## 2022-02-02 DIAGNOSIS — I6529 Occlusion and stenosis of unspecified carotid artery: Secondary | ICD-10-CM | POA: Diagnosis present

## 2022-02-02 DIAGNOSIS — Z955 Presence of coronary angioplasty implant and graft: Secondary | ICD-10-CM

## 2022-02-02 DIAGNOSIS — R55 Syncope and collapse: Secondary | ICD-10-CM | POA: Diagnosis not present

## 2022-02-02 DIAGNOSIS — J9601 Acute respiratory failure with hypoxia: Secondary | ICD-10-CM | POA: Diagnosis not present

## 2022-02-02 DIAGNOSIS — Z7901 Long term (current) use of anticoagulants: Secondary | ICD-10-CM | POA: Diagnosis not present

## 2022-02-02 DIAGNOSIS — R4189 Other symptoms and signs involving cognitive functions and awareness: Secondary | ICD-10-CM | POA: Diagnosis not present

## 2022-02-02 DIAGNOSIS — J15211 Pneumonia due to Methicillin susceptible Staphylococcus aureus: Secondary | ICD-10-CM | POA: Diagnosis present

## 2022-02-02 DIAGNOSIS — I10 Essential (primary) hypertension: Secondary | ICD-10-CM | POA: Insufficient documentation

## 2022-02-02 DIAGNOSIS — R0902 Hypoxemia: Secondary | ICD-10-CM | POA: Diagnosis not present

## 2022-02-02 DIAGNOSIS — M7981 Nontraumatic hematoma of soft tissue: Secondary | ICD-10-CM | POA: Diagnosis not present

## 2022-02-02 DIAGNOSIS — K5730 Diverticulosis of large intestine without perforation or abscess without bleeding: Secondary | ICD-10-CM | POA: Diagnosis not present

## 2022-02-02 DIAGNOSIS — J9622 Acute and chronic respiratory failure with hypercapnia: Secondary | ICD-10-CM | POA: Diagnosis present

## 2022-02-02 DIAGNOSIS — Z9981 Dependence on supplemental oxygen: Secondary | ICD-10-CM

## 2022-02-02 DIAGNOSIS — I878 Other specified disorders of veins: Secondary | ICD-10-CM | POA: Diagnosis present

## 2022-02-02 DIAGNOSIS — I771 Stricture of artery: Secondary | ICD-10-CM | POA: Diagnosis not present

## 2022-02-02 DIAGNOSIS — R0689 Other abnormalities of breathing: Secondary | ICD-10-CM | POA: Diagnosis not present

## 2022-02-02 DIAGNOSIS — J9690 Respiratory failure, unspecified, unspecified whether with hypoxia or hypercapnia: Secondary | ICD-10-CM | POA: Diagnosis not present

## 2022-02-02 DIAGNOSIS — Z79899 Other long term (current) drug therapy: Secondary | ICD-10-CM

## 2022-02-02 DIAGNOSIS — R4020 Unspecified coma: Secondary | ICD-10-CM

## 2022-02-02 DIAGNOSIS — E119 Type 2 diabetes mellitus without complications: Secondary | ICD-10-CM

## 2022-02-02 HISTORY — DX: Type 2 diabetes mellitus without complications: E11.9

## 2022-02-02 HISTORY — DX: Chronic obstructive pulmonary disease, unspecified: J44.9

## 2022-02-02 HISTORY — DX: Raised antibody titer: R76.0

## 2022-02-02 HISTORY — DX: Essential (primary) hypertension: I10

## 2022-02-02 HISTORY — DX: Obstructive sleep apnea (adult) (pediatric): G47.33

## 2022-02-02 HISTORY — DX: Atherosclerotic heart disease of native coronary artery without angina pectoris: I25.10

## 2022-02-02 HISTORY — DX: Unspecified right bundle-branch block: I45.10

## 2022-02-02 HISTORY — DX: Vertebro-basilar artery syndrome: G45.0

## 2022-02-02 LAB — CBC
HCT: 30.8 % — ABNORMAL LOW (ref 39.0–52.0)
Hemoglobin: 8.6 g/dL — ABNORMAL LOW (ref 13.0–17.0)
MCH: 28.6 pg (ref 26.0–34.0)
MCHC: 27.9 g/dL — ABNORMAL LOW (ref 30.0–36.0)
MCV: 102.3 fL — ABNORMAL HIGH (ref 80.0–100.0)
Platelets: 344 10*3/uL (ref 150–400)
RBC: 3.01 MIL/uL — ABNORMAL LOW (ref 4.22–5.81)
RDW: 16.8 % — ABNORMAL HIGH (ref 11.5–15.5)
WBC: 16.7 10*3/uL — ABNORMAL HIGH (ref 4.0–10.5)
nRBC: 0.9 % — ABNORMAL HIGH (ref 0.0–0.2)

## 2022-02-02 LAB — TRIGLYCERIDES: Triglycerides: 191 mg/dL — ABNORMAL HIGH (ref <150)

## 2022-02-02 LAB — COMPREHENSIVE METABOLIC PANEL
ALT: 37 U/L (ref 0–44)
AST: 36 U/L (ref 15–41)
Albumin: 3.4 g/dL — ABNORMAL LOW (ref 3.5–5.0)
Alkaline Phosphatase: 69 U/L (ref 38–126)
Anion gap: 13 (ref 5–15)
BUN: 26 mg/dL — ABNORMAL HIGH (ref 8–23)
CO2: 26 mmol/L (ref 22–32)
Calcium: 8.5 mg/dL — ABNORMAL LOW (ref 8.9–10.3)
Chloride: 106 mmol/L (ref 98–111)
Creatinine, Ser: 1.32 mg/dL — ABNORMAL HIGH (ref 0.61–1.24)
GFR, Estimated: 59 mL/min — ABNORMAL LOW (ref >60)
Glucose, Bld: 166 mg/dL — ABNORMAL HIGH (ref 70–99)
Potassium: 4.8 mmol/L (ref 3.5–5.1)
Sodium: 145 mmol/L (ref 135–145)
Total Bilirubin: 0.3 mg/dL (ref 0.3–1.2)
Total Protein: 7.3 g/dL (ref 6.5–8.1)

## 2022-02-02 LAB — URINALYSIS, ROUTINE W REFLEX MICROSCOPIC
Bilirubin Urine: NEGATIVE
Glucose, UA: NEGATIVE mg/dL
Hgb urine dipstick: NEGATIVE
Ketones, ur: NEGATIVE mg/dL
Leukocytes,Ua: NEGATIVE
Nitrite: NEGATIVE
Protein, ur: 100 mg/dL — AB
Specific Gravity, Urine: 1.024 (ref 1.005–1.030)
pH: 7 (ref 5.0–8.0)

## 2022-02-02 LAB — I-STAT ARTERIAL BLOOD GAS, ED
Acid-Base Excess: 3 mmol/L — ABNORMAL HIGH (ref 0.0–2.0)
Acid-Base Excess: 3 mmol/L — ABNORMAL HIGH (ref 0.0–2.0)
Bicarbonate: 31.9 mmol/L — ABNORMAL HIGH (ref 20.0–28.0)
Bicarbonate: 30.2 mmol/L — ABNORMAL HIGH (ref 20.0–28.0)
Calcium, Ion: 1.15 mmol/L (ref 1.15–1.40)
Calcium, Ion: 1.17 mmol/L (ref 1.15–1.40)
HCT: 26 % — ABNORMAL LOW (ref 39.0–52.0)
HCT: 27 % — ABNORMAL LOW (ref 39.0–52.0)
Hemoglobin: 8.8 g/dL — ABNORMAL LOW (ref 13.0–17.0)
Hemoglobin: 9.2 g/dL — ABNORMAL LOW (ref 13.0–17.0)
O2 Saturation: 100 %
O2 Saturation: 98 %
Patient temperature: 97
Patient temperature: 98.6
Potassium: 4.1 mmol/L (ref 3.5–5.1)
Potassium: 4.1 mmol/L (ref 3.5–5.1)
Sodium: 143 mmol/L (ref 135–145)
Sodium: 144 mmol/L (ref 135–145)
TCO2: 34 mmol/L — ABNORMAL HIGH (ref 22–32)
TCO2: 32 mmol/L (ref 22–32)
pCO2 arterial: 71.8 mmHg (ref 32–48)
pCO2 arterial: 57.2 mmHg — ABNORMAL HIGH (ref 32–48)
pH, Arterial: 7.251 — ABNORMAL LOW (ref 7.35–7.45)
pH, Arterial: 7.33 — ABNORMAL LOW (ref 7.35–7.45)
pO2, Arterial: 520 mmHg — ABNORMAL HIGH (ref 83–108)
pO2, Arterial: 107 mmHg (ref 83–108)

## 2022-02-02 LAB — MRSA NEXT GEN BY PCR, NASAL: MRSA by PCR Next Gen: DETECTED — AB

## 2022-02-02 LAB — I-STAT CHEM 8, ED
BUN: 28 mg/dL — ABNORMAL HIGH (ref 8–23)
Calcium, Ion: 1.09 mmol/L — ABNORMAL LOW (ref 1.15–1.40)
Chloride: 107 mmol/L (ref 98–111)
Creatinine, Ser: 1.3 mg/dL — ABNORMAL HIGH (ref 0.61–1.24)
Glucose, Bld: 166 mg/dL — ABNORMAL HIGH (ref 70–99)
HCT: 29 % — ABNORMAL LOW (ref 39.0–52.0)
Hemoglobin: 9.9 g/dL — ABNORMAL LOW (ref 13.0–17.0)
Potassium: 4.7 mmol/L (ref 3.5–5.1)
Sodium: 145 mmol/L (ref 135–145)
TCO2: 29 mmol/L (ref 22–32)

## 2022-02-02 LAB — HEMOGLOBIN A1C
Hgb A1c MFr Bld: 5.6 % (ref 4.8–5.6)
Mean Plasma Glucose: 114.02 mg/dL

## 2022-02-02 LAB — PROTIME-INR
INR: 1.9 — ABNORMAL HIGH (ref 0.8–1.2)
Prothrombin Time: 21.2 seconds — ABNORMAL HIGH (ref 11.4–15.2)

## 2022-02-02 LAB — MAGNESIUM: Magnesium: 1.9 mg/dL (ref 1.7–2.4)

## 2022-02-02 LAB — TROPONIN I (HIGH SENSITIVITY)
Troponin I (High Sensitivity): 9 ng/L (ref <18)
Troponin I (High Sensitivity): 40 ng/L — ABNORMAL HIGH (ref <18)

## 2022-02-02 LAB — BRAIN NATRIURETIC PEPTIDE: B Natriuretic Peptide: 302.8 pg/mL — ABNORMAL HIGH (ref 0.0–100.0)

## 2022-02-02 LAB — LACTIC ACID, PLASMA: Lactic Acid, Venous: 3.4 mmol/L (ref 0.5–1.9)

## 2022-02-02 LAB — CK: Total CK: 40 U/L — ABNORMAL LOW (ref 49–397)

## 2022-02-02 LAB — GLUCOSE, CAPILLARY
Glucose-Capillary: 139 mg/dL — ABNORMAL HIGH (ref 70–99)
Glucose-Capillary: 99 mg/dL (ref 70–99)
Glucose-Capillary: 127 mg/dL — ABNORMAL HIGH (ref 70–99)
Glucose-Capillary: 110 mg/dL — ABNORMAL HIGH (ref 70–99)
Glucose-Capillary: 114 mg/dL — ABNORMAL HIGH (ref 70–99)

## 2022-02-02 LAB — PHOSPHORUS: Phosphorus: 1.5 mg/dL — ABNORMAL LOW (ref 2.5–4.6)

## 2022-02-02 LAB — SAMPLE TO BLOOD BANK

## 2022-02-02 LAB — ETHANOL: Alcohol, Ethyl (B): <10 mg/dL (ref <10)

## 2022-02-02 MED ORDER — TOPIRAMATE 25 MG PO TABS
75.0000 mg | ORAL_TABLET | Freq: Two times a day (BID) | ORAL | Status: DC
  Administered 2022-02-02 – 2022-02-04 (×4): 75 mg
  Filled 2022-02-02 (×6): qty 3

## 2022-02-02 MED ORDER — FENTANYL CITRATE PF 50 MCG/ML IJ SOSY
25.0000 ug | PREFILLED_SYRINGE | INTRAMUSCULAR | Status: DC | PRN
  Administered 2022-02-02: 25 ug via INTRAVENOUS

## 2022-02-02 MED ORDER — WARFARIN SODIUM 5 MG PO TABS
5.0000 mg | ORAL_TABLET | Freq: Once | ORAL | Status: AC
  Administered 2022-02-02: 5 mg
  Filled 2022-02-02 (×2): qty 1

## 2022-02-02 MED ORDER — ASPIRIN 81 MG PO CHEW
81.0000 mg | CHEWABLE_TABLET | Freq: Every day | ORAL | Status: DC
  Administered 2022-02-02 – 2022-02-04 (×3): 81 mg
  Filled 2022-02-02 (×3): qty 1

## 2022-02-02 MED ORDER — IOHEXOL 350 MG/ML SOLN
75.0000 mL | Freq: Once | INTRAVENOUS | Status: AC | PRN
  Administered 2022-02-02: 75 mL via INTRAVENOUS

## 2022-02-02 MED ORDER — ORAL CARE MOUTH RINSE
15.0000 mL | OROMUCOSAL | Status: DC
  Administered 2022-02-02 – 2022-02-04 (×27): 15 mL via OROMUCOSAL

## 2022-02-02 MED ORDER — PROPOFOL 1000 MG/100ML IV EMUL
5.0000 ug/kg/min | INTRAVENOUS | Status: DC
  Administered 2022-02-02 (×5): 30 ug/kg/min via INTRAVENOUS
  Administered 2022-02-03: 20 ug/kg/min via INTRAVENOUS
  Administered 2022-02-03: 30 ug/kg/min via INTRAVENOUS
  Administered 2022-02-03: 40 ug/kg/min via INTRAVENOUS
  Administered 2022-02-03: 20 ug/kg/min via INTRAVENOUS
  Administered 2022-02-04: 30 ug/kg/min via INTRAVENOUS
  Filled 2022-02-02 (×9): qty 100

## 2022-02-02 MED ORDER — LORAZEPAM 2 MG/ML IJ SOLN: 2.0000 mg | INTRAMUSCULAR | Status: DC | PRN

## 2022-02-02 MED ORDER — LACTATED RINGERS IV SOLN: INTRAVENOUS | Status: DC

## 2022-02-02 MED ORDER — DOCUSATE SODIUM 50 MG/5ML PO LIQD: 100.0000 mg | Freq: Two times a day (BID) | ORAL | Status: DC | PRN

## 2022-02-02 MED ORDER — GABAPENTIN 250 MG/5ML PO SOLN
600.0000 mg | Freq: Three times a day (TID) | ORAL | Status: DC
  Administered 2022-02-02 – 2022-02-04 (×7): 600 mg
  Filled 2022-02-02 (×10): qty 12

## 2022-02-02 MED ORDER — INSULIN ASPART 100 UNIT/ML IJ SOLN
0.0000 [IU] | INTRAMUSCULAR | Status: DC
  Administered 2022-02-02 (×2): 2 [IU] via SUBCUTANEOUS
  Administered 2022-02-03: 8 [IU] via SUBCUTANEOUS
  Administered 2022-02-03: 5 [IU] via SUBCUTANEOUS
  Administered 2022-02-03 – 2022-02-04 (×3): 3 [IU] via SUBCUTANEOUS
  Administered 2022-02-04: 2 [IU] via SUBCUTANEOUS
  Administered 2022-02-04: 8 [IU] via SUBCUTANEOUS
  Administered 2022-02-04: 2 [IU] via SUBCUTANEOUS
  Administered 2022-02-04: 3 [IU] via SUBCUTANEOUS

## 2022-02-02 MED ORDER — POLYETHYLENE GLYCOL 3350 17 G PO PACK: 17.0000 g | PACK | Freq: Every day | ORAL | Status: DC | PRN

## 2022-02-02 MED ORDER — PROPOFOL 1000 MG/100ML IV EMUL
5.0000 ug/kg/min | INTRAVENOUS | Status: DC
  Administered 2022-02-02: 10 ug/kg/min via INTRAVENOUS

## 2022-02-02 MED ORDER — ARFORMOTEROL TARTRATE 15 MCG/2ML IN NEBU
15.0000 ug | INHALATION_SOLUTION | Freq: Two times a day (BID) | RESPIRATORY_TRACT | Status: DC
  Administered 2022-02-02 – 2022-02-06 (×9): 15 ug via RESPIRATORY_TRACT
  Filled 2022-02-02 (×9): qty 2

## 2022-02-02 MED ORDER — PROSOURCE TF20 ENFIT COMPATIBL EN LIQD
60.0000 mL | Freq: Two times a day (BID) | ENTERAL | Status: DC
  Administered 2022-02-02 – 2022-02-04 (×5): 60 mL
  Filled 2022-02-02 (×5): qty 60

## 2022-02-02 MED ORDER — POTASSIUM & SODIUM PHOSPHATES 280-160-250 MG PO PACK
2.0000 | PACK | ORAL | Status: AC
  Administered 2022-02-02 (×2): 2
  Filled 2022-02-02 (×2): qty 2

## 2022-02-02 MED ORDER — ORAL CARE MOUTH RINSE: 15.0000 mL | OROMUCOSAL | Status: DC | PRN

## 2022-02-02 MED ORDER — VITAL 1.5 CAL PO LIQD
1000.0000 mL | ORAL | Status: DC
  Administered 2022-02-02 – 2022-02-04 (×3): 1000 mL
  Filled 2022-02-02 (×2): qty 1000

## 2022-02-02 MED ORDER — IPRATROPIUM-ALBUTEROL 0.5-2.5 (3) MG/3ML IN SOLN: 3.0000 mL | RESPIRATORY_TRACT | Status: DC | PRN

## 2022-02-02 MED ORDER — FENTANYL 2500MCG IN NS 250ML (10MCG/ML) PREMIX INFUSION
25.0000 ug/h | INTRAVENOUS | Status: DC
  Administered 2022-02-02: 100 ug/h via INTRAVENOUS
  Administered 2022-02-02: 50 ug/h via INTRAVENOUS
  Administered 2022-02-03: 100 ug/h via INTRAVENOUS
  Filled 2022-02-02 (×3): qty 250

## 2022-02-02 MED ORDER — CHLORHEXIDINE GLUCONATE 0.12 % MT SOLN
15.0000 mL | Freq: Once | OROMUCOSAL | Status: AC
  Administered 2022-02-02: 15 mL via OROMUCOSAL
  Filled 2022-02-02: qty 15

## 2022-02-02 MED ORDER — FENTANYL CITRATE (PF) 100 MCG/2ML IJ SOLN
INTRAMUSCULAR | Status: AC
  Administered 2022-02-02: 50 ug
  Filled 2022-02-02: qty 2

## 2022-02-02 MED ORDER — MUPIROCIN 2 % EX OINT
1.0000 | TOPICAL_OINTMENT | Freq: Two times a day (BID) | CUTANEOUS | Status: DC
  Administered 2022-02-02 – 2022-02-06 (×9): 1 via NASAL
  Filled 2022-02-02 (×4): qty 22

## 2022-02-02 MED ORDER — REVEFENACIN 175 MCG/3ML IN SOLN
175.0000 ug | Freq: Every day | RESPIRATORY_TRACT | Status: DC
  Administered 2022-02-02 – 2022-02-06 (×5): 175 ug via RESPIRATORY_TRACT
  Filled 2022-02-02 (×6): qty 3

## 2022-02-02 MED ORDER — METHYLPREDNISOLONE SODIUM SUCC 40 MG IJ SOLR
40.0000 mg | INTRAMUSCULAR | Status: AC
  Administered 2022-02-02 – 2022-02-04 (×3): 40 mg via INTRAVENOUS
  Filled 2022-02-02 (×3): qty 1

## 2022-02-02 MED ORDER — FAMOTIDINE 20 MG PO TABS
20.0000 mg | ORAL_TABLET | Freq: Two times a day (BID) | ORAL | Status: DC
  Administered 2022-02-02 – 2022-02-04 (×5): 20 mg
  Filled 2022-02-02 (×5): qty 1

## 2022-02-02 MED ORDER — FENTANYL BOLUS VIA INFUSION
25.0000 ug | INTRAVENOUS | Status: DC | PRN
  Administered 2022-02-02 (×2): 25 ug via INTRAVENOUS
  Administered 2022-02-03 (×3): 50 ug via INTRAVENOUS
  Administered 2022-02-03: 100 ug via INTRAVENOUS

## 2022-02-02 MED ORDER — LACTATED RINGERS IV BOLUS
500.0000 mL | Freq: Once | INTRAVENOUS | Status: AC
  Administered 2022-02-02: 500 mL via INTRAVENOUS

## 2022-02-02 MED ORDER — GADOBUTROL 1 MMOL/ML IV SOLN
10.0000 mL | Freq: Once | INTRAVENOUS | Status: AC | PRN
  Administered 2022-02-02: 10 mL via INTRAVENOUS

## 2022-02-02 MED ORDER — WARFARIN - PHARMACIST DOSING INPATIENT: Freq: Every day | Status: DC

## 2022-02-02 MED ORDER — IPRATROPIUM-ALBUTEROL 0.5-2.5 (3) MG/3ML IN SOLN: 3.0000 mL | RESPIRATORY_TRACT | Status: DC

## 2022-02-02 MED ORDER — CHLORHEXIDINE GLUCONATE CLOTH 2 % EX PADS
6.0000 | MEDICATED_PAD | Freq: Every day | CUTANEOUS | Status: AC
  Administered 2022-02-02 – 2022-02-06 (×4): 6 via TOPICAL

## 2022-02-02 MED ORDER — IPRATROPIUM-ALBUTEROL 0.5-2.5 (3) MG/3ML IN SOLN
RESPIRATORY_TRACT | Status: AC
  Filled 2022-02-02: qty 3

## 2022-02-02 MED ORDER — SODIUM PHOSPHATES 45 MMOLE/15ML IV SOLN
30.0000 mmol | Freq: Once | INTRAVENOUS | Status: AC
  Administered 2022-02-02: 30 mmol via INTRAVENOUS
  Filled 2022-02-02: qty 10

## 2022-02-02 MED ORDER — ROSUVASTATIN CALCIUM 20 MG PO TABS
10.0000 mg | ORAL_TABLET | Freq: Every day | ORAL | Status: DC
  Administered 2022-02-02 – 2022-02-04 (×3): 10 mg
  Filled 2022-02-02 (×3): qty 1

## 2022-02-02 MED ORDER — FENTANYL CITRATE (PF) 100 MCG/2ML IJ SOLN
INTRAMUSCULAR | Status: AC
  Filled 2022-02-02: qty 2

## 2022-02-02 MED ORDER — FENTANYL CITRATE PF 50 MCG/ML IJ SOSY
25.0000 ug | PREFILLED_SYRINGE | Freq: Once | INTRAMUSCULAR | Status: AC
  Administered 2022-02-02: 25 ug via INTRAVENOUS

## 2022-02-02 NOTE — Progress Notes (Signed)
Pt transported to 3M02 from TRA B without any complications.

## 2022-02-02 NOTE — ED Triage Notes (Signed)
Pt BIB [facility] from home, found lying supine unresponsive after a presumed fall. EMS found pt with agonal respirations and seizure like activity, decorticate posturing and nystagmus. Given 2.5mg  versed by EMS, intubated on arrival. EMS VS: Bp 190/100, HR 100-110

## 2022-02-02 NOTE — Progress Notes (Signed)
NAME:  Jose [name], MRN:  [id number], DOB:  Feb 28, 1956, LOS: 0 ADMISSION DATE:  02/02/2022, CONSULTATION DATE:  02/02/22 REFERRING MD:  Pilar Plate, CHIEF COMPLAINT:  Fall   History of Present Illness:  Jose [name] is a 66 y.o. M with PMH of CAD s/p CABG, AVS, PVD, anti-phospholipid syndrome, COPD on 4-6L home O2 and hx non-compliance with trilogy, HL, PE and vertebrobasilar insufficiency causing recurrent syncope who was brought into the ED after being found face down at home after presumed fall.  He seemed to have seizure activity and agonal respirations so required intubation.  He was evaluated by Trauma and CT head/C-spine/abd and pelvis negative.  Labs showed creatinine 1.3, lactic acid 3.4, INR 1.9, trop 9, pH 7.2, pCO2 71.  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 Seizure activity, intubated  Interim History / Subjective:  No acute events overnight. Remains sedated on propofol and fentanyl. Wife indicated no new meds recently before his last syncopal episode, takes gabapentin for history of seizures. No recent illness, fevers, chills. No new complains.   Objective   Blood pressure (!) 114/54, pulse 60, temperature 98.8 F (37.1 C), resp. rate (!) 26, height 5\' 10"  (1.778 m), weight 118.7 kg, SpO2 100 %.    Vent Mode: PRVC FiO2 (%):  [40 %-100 %] 40 % Set Rate:  [20 bmp-26 bmp] 26 bmp Vt Set:  [580 mL] 580 mL PEEP:  [5 cmH20] 5 cmH20 Plateau Pressure:  [29 cmH20-34  cmH20] 34 cmH20   Intake/Output Summary (Last 24 hours) at 02/02/2022 02/04/2022 Last data filed at 02/02/2022 0800 Gross per 24 hour  Intake 1186.45 ml  Output 50 ml  Net 1136.45 ml   Filed Weights   02/02/22 0435 02/02/22 0719  Weight: 110 kg 118.7 kg     General:  critically ill appearing man lying in bed in NAD Neck: C-collar in place HEENT: [place]/AT, eyes anicteric Neuro: RASS -4, +cough reflex CV: S1S2, RRR PULM:  high peak pressures, Pplat 29, no wheezing but mild obstruction on vent waveforms. No significant ETT secretions.  GI: soft, NT, hypoactive bowel sounds, obese Extremities: no peripheral edema, no cyanosis Skin: no rashes, warm & dry  EEG pending  CT chest personally reviewed> no opacities or masses, some linear scars int he bases. Bronchomalacia.     Resolved Hospital Problem list     Assessment & Plan:   Acute hypoxic and hypercarbic respiratory failure secondary to fall and possible seizure activity Ongoing tobacco absue -LTVV -VAP prevention protocol -PAD protocol for sedation -daily SAT & SBT as appropriate; need to rule out seizures before reducing propofol -Bronchodilators, steroids. No need for antibiotics currently. -needs to quit smoking; will counsel when awake -follows with [facility]  Syncope, possible cardiac arrest by bystanders  -Neuro consult; continuous EEG  History Vertebrobasilar insufficiency -EEG -maintain adequate perfusion pressure  AKI -IVF -strict I/O -renally dose meds, avoid nephrotoxic meds -con't foley -check CK level  BPH -hold PTA tamsulosin -may eventually need prazosin to get foley out  History of CAD and CABG -repeat echo -  hold PTA metoprolol -ok to con't statin -start aspirin  Anti-phospholipid syndrome -Con't coumadin; dosing per pharmacy, goal 2-3 -Per evaluation at [facility], some question of this diagnosis and they were re-evaluating. Recommend follow up OP.  Hyperglycemia -SSI PRN -goal BG  140-180 -hold PTA glimepiridie  History of seizures  -resume PTA Gabapentin -follows with [facility]  Best Practice (right click and "Reselect all SmartList Selections" daily)   Diet/type: NPO DVT prophylaxis: Coumadin GI prophylaxis: PPI Lines: N/A Foley:  Yes, and it is still needed Code Status:  full code Last date of multidisciplinary goals of care discussion [wife updated at bedside this morning]  Labs   CBC: Recent Labs  Lab 02/02/22 0410 02/02/22 0417 02/02/22 0447 02/02/22 0731  WBC 16.7*  --   --   --   HGB 8.6* 9.9* 8.8* 9.2*  HCT 30.8* 29.0* 26.0* 27.0*  MCV 102.3*  --   --   --   PLT 344  --   --   --      Basic Metabolic Panel: Recent Labs  Lab 02/02/22 0410 02/02/22 0417 02/02/22 0447 02/02/22 0731  NA 145 145 143 144  K 4.8 4.7 4.1 4.1  CL 106 107  --   --   CO2 26  --   --   --   GLUCOSE 166* 166*  --   --   BUN 26* 28*  --   --   CREATININE 1.32* 1.30*  --   --   CALCIUM 8.5*  --   --   --     GFR: Estimated Creatinine Clearance: 72.2 mL/min (A) (by C-G formula based on SCr of 1.3 mg/dL (H)). Recent Labs  Lab 02/02/22 0410  WBC 16.7*  LATICACIDVEN 3.4*      This patient is critically ill with multiple organ system failure which requires frequent high complexity decision making, assessment, support, evaluation, and titration of therapies. This was completed through the application of advanced monitoring technologies and extensive interpretation of multiple databases. During this encounter critical care time was devoted to patient care services described in this note for 45 minutes.  Steffanie Dunn, DO 02/02/22 10:11 AM [facility]  For contact information, see Amion. If no response to pager, please call PCCM consult pager. After hours, 7PM- 7AM, please call Elink.

## 2022-02-02 NOTE — Progress Notes (Signed)
Initial Nutrition Assessment  DOCUMENTATION CODES:   Not applicable  INTERVENTION:   Initiate tube feeds via OG tube: - Vital 1.5 @ 55 ml/hr (1320 ml/day) - PROSource TF20 60 ml BID  Tube feeding regimen provides 2140 kcal, 129 grams of protein, and 1008 ml of H2O.   NUTRITION DIAGNOSIS:   Inadequate oral intake related to inability to eat as evidenced by NPO status.  GOAL:   Patient will meet greater than or equal to 90% of their needs  MONITOR:   Vent status, Labs, Weight trends, TF tolerance  REASON FOR ASSESSMENT:   Ventilator, Consult Enteral/tube feeding initiation and management  ASSESSMENT:   66 year old male who presented to the ED on 11/20 after a fall. PMH of antiphospholipid syndrome, CAD s/p CABG, PVD, COPD, pulmonary embolism, vertebrobasilar insufficiency. Pt required intubation in the ED and admitted with hypercapnic respiratory failure, AKI.  Discussed pt with RN and during ICU rounds. Consult received for enteral nutrition initiation and management. Pt with OG tube in distal stomach per abdominal x-ray today. Concern for seizures.  Spoke with pt's wife at bedside. She reports pt has been hospitalized recently along with a 2 week rehab stay. Pt typically eats 2 meals daily when he is feeling well. An example of something he may eat includes a pork chop, rice, and a salad. Pt does not eat a lot of vegetables but does eat a lot of protein. Pt's wife reports that pt and family have been working on cutting back on carbohydrates and that pt may have been losing weight because of this. She reports that his UBW is 240 lbs. No weight history available in chart.  Patient is currently intubated on ventilator support MV: 14 L/min Temp (24hrs), Avg:98.7 F (37.1 C), Min:97 F (36.1 C), Max:99.7 F (37.6 C)  Drips: Propofol: 20 mcg/kg/min (provides 348 kcal daily from lipid) Fentanyl LR: 100 ml/hr  Medications reviewed and include: pepcid, SSI q 4 hours, IV  solu-medrol, warfarin  Labs reviewed: BUN 28, creatinine 1.30, TG 191, lactic acid 3.4, WBC 16.7, hemoglobin 9.2  CBG's: 99-139  NUTRITION - FOCUSED PHYSICAL EXAM:  Flowsheet Row Most Recent Value  Orbital Region No depletion  Upper Arm Region No depletion  Thoracic and Lumbar Region No depletion  Buccal Region Unable to assess  Temple Region No depletion  Clavicle Bone Region No depletion  Clavicle and Acromion Bone Region No depletion  Scapular Bone Region Unable to assess  Dorsal Hand No depletion  Patellar Region No depletion  Anterior Thigh Region No depletion  Posterior Calf Region No depletion  Edema (RD Assessment) Mild  [generalized]  Hair Reviewed  Eyes Reviewed  Mouth Reviewed  Skin Reviewed  Nails Reviewed       Diet Order:   Diet Order             Diet NPO time specified  Diet effective now                   EDUCATION NEEDS:   No education needs have been identified at this time  Skin:  Skin Assessment: Reviewed RN Assessment  Last BM:  no documented BM  Height:   Ht Readings from Last 1 Encounters:  02/02/22 5\' 10"  (1.778 m)    Weight:   Wt Readings from Last 1 Encounters:  02/02/22 118.7 kg    Ideal Body Weight:  75.5 kg  BMI:  Body mass index is 37.55 kg/m.  Estimated Nutritional Needs:   Kcal:  2000-2200  Protein:  120-140 grams  Fluid:  >2.0 L    Mertie Clause, MS, RD, LDN Inpatient Clinical Dietitian Please see AMiON for contact information.

## 2022-02-02 NOTE — Consult Note (Signed)
Neurology Consultation    Reason for Consult:Concern for seizure  CC: Fall  HISTORY OF PRESENT ILLNESS   HPI   Jose [name] is a 66 y.o. male with a past medical history of antiphospholipid antibody syndrome on coumadin, COPD on 4-6L with CPAP, current smoker, DM, HTN, OSA, CAD s/p CABG, and vertebrobasilar insufficiency presenting after being found on the ground by his wife with his CPAP disconnected.  EMS did report seizure like activity and he did have agonal respirations upon their arrival. He required BVM by EMS and was subsequently intubated in the ED. He is currently sedated with propofol, but does open eyes to voice and track examiner. He will fall simple one step commands in a delayed fashion.   He was admitted to [facility] from 11/8- 11/13 Patient presents for further evaluation after a fall that occurred on 11/6 and resulted in a large hematoma on his right buttock. He did endorse hitting his head during this fall but denied losing consciousness.  He reported to the EDP that he has had frequent episode for a number of years. He typically has dizziness prior to the episodes. During this hospitalization he did require 1u of PRBC and hematology was consulted. He was bridged from heparin to lovenox to coumadin during this stay as well.    History is obtained from:wife, chart review  Premorbid modified Rankin scale (mRS): 0   The patient denies headache, dizziness, visual changes, problems with swallowing or speech, focal muscle weakness, numbness or tingling of her extremities, abnormal movements, or other focal neurological deficits.  ROS: Unable to obtain due to altered mental status.   PAST MEDICAL HISTORY    Past Medical History:  Past Medical History:  Diagnosis Date   Antiphospholipid antibody positive    CAD (coronary artery disease)    COPD (chronic obstructive pulmonary disease) (HCC)    DM (diabetes mellitus) (HCC)    HTN (hypertension)    OSA (obstructive sleep  apnea)    RBBB    Vertebrobasilar artery insufficiency     No family history on file. Family History  Problem Relation Age of Onset   CAD Father     Allergies:  Not on File  Social History:   has no history on file for tobacco use, alcohol use, and drug use.    Medications Medications Prior to Admission  Medication Sig Dispense Refill   albuterol (VENTOLIN HFA) 108 (90 Base) MCG/ACT inhaler Inhale into the lungs.     DULoxetine (CYMBALTA) 30 MG capsule Take 30 mg by mouth daily.     DULoxetine (CYMBALTA) 60 MG capsule Take 60 mg by mouth daily.     enoxaparin (LOVENOX) 120 MG/0.8ML injection Inject into the skin.     gabapentin (NEURONTIN) 600 MG tablet Take 600 mg by mouth 3 (three) times daily.     glimepiride (AMARYL) 4 MG tablet Take 4 mg by mouth 2 (two) times daily.     HYDROcodone-acetaminophen (NORCO/VICODIN) 5-325 MG tablet Take 1 tablet by mouth every 4 (four) hours as needed for severe pain or moderate pain.     metoprolol tartrate (LOPRESSOR) 25 MG tablet Take 25 mg by mouth daily.     pantoprazole (PROTONIX) 40 MG tablet Take 40 mg by mouth 2 (two) times daily.     rosuvastatin (CRESTOR) 10 MG tablet Take 10 mg by mouth at bedtime.     tamsulosin (FLOMAX) 0.4 MG CAPS capsule Take 0.8 mg by mouth daily.     tiZANidine (ZANAFLEX) 4  MG tablet Take 4 mg by mouth 3 (three) times daily.     topiramate (TOPAMAX) 50 MG tablet Take 50 mg by mouth 3 (three) times daily.      EXAMINATION    Current vital signs:    02/02/2022   10:15 AM 02/02/2022   10:00 AM 02/02/2022    9:45 AM  Vitals with BMI  Systolic 140 122 096  Diastolic 74 57 58  Pulse 76 70 73    Examination:  GENERAL: Intubated, sedated HEENT: - Normocephalic and atraumatic, dry mm, no lymphadenopathy, no Thyromegally LUNGS - mechanically ventilated CV - S1S2 RRR, equal pulses bilaterally. ABDOMEN - Soft, nontender, nondistended with normoactive BS Ext: warm, well perfused, intact peripheral pulses,  no pedal edema  NEURO:  Mental Status: intubated, sedated, opens eyes to voice and tracks examiner. Gives a thumbs up and wiggles toes to commands- slow to respond to commands Cranial Nerves:  II: PERRL. Visual fields full to confrontation.  III, IV, VI: EOM intact, Blinks to threat bilaterally VII: no facial asymmetry   VIII: hearing intact to voice IX, X: cough and gag intact GE:ZMOQ is midline Motor:  Generalized weakness  Localizes to pain in bilateral upper and lower extremities, but can not lift them off of the bed.  Wiggles fingers and toes  Sensation- localizes to pain  Coordination: unable to complete   LABS   I have reviewed labs in epic and the results pertinent to this consultation are:  No results found for: "[facility]" Lab Results  Component Value Date   ALT 37 02/02/2022   AST 36 02/02/2022   ALKPHOS 69 02/02/2022   BILITOT 0.3 02/02/2022   Lab Results  Component Value Date   HGBA1C 5.6 02/02/2022   Lab Results  Component Value Date   WBC 16.7 (H) 02/02/2022   HGB 9.2 (L) 02/02/2022   HCT 27.0 (L) 02/02/2022   MCV 102.3 (H) 02/02/2022   PLT 344 02/02/2022   No results found for: "VITAMINB12" No results found for: "FOLATE" Lab Results  Component Value Date   NA 144 02/02/2022   K 4.1 02/02/2022   CL 107 02/02/2022   CO2 26 02/02/2022     DIAGNOSTIC IMAGING/PROCEDURES   I have reviewed the images obtained:, as below    CT-head 1. No acute intracranial CT findings or depressed skull fractures. Atrophy and small-vessel disease. 2. Bilateral proptosis with proliferated intraorbital fat, but without extraocular muscle thickening. This was seen previously and could be due to obesity, chronic corticosteroid use, thyroid ophthalmopathy or Graves' disease.  3. Cervical degenerative changes without evidence of fractures or listhesis. 4. 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 Aortic and coronary artery atherosclerosis. No findings of acute CHF.   5. Intubated, with retained secretions in the distal trachea. 6. No acute trauma related findings in the chest, abdomen or pelvis. 7. Right gluteal subcutaneous hematoma measuring 9.3 x 3.8 cm, only slightly larger than on 06/22/2021. No active hemorrhaging vessel is seen within the hematoma. 8. Ascending colitis versus nondistention. Constipation and diverticulosis. 9. Nonobstructive nephrolithiasis. 10. Osteopenia and degenerative change without acute regional skeletal abnormality.  MRI brain  EEG   ASSESSMENT/PLAN    Assessment: 66 y.o. male with a past medical history of antiphospholipid antibody syndrome on coumadin, COPD on 4-6L with CPAP, current smoker, DM, HTN, OSA, CAD s/p CABG, and vertebrobasilar insufficiency presenting after being found on the ground by his wife with his CPAP disconnected. He required BVM by EMS and was subsequently intubated  in the ED. He is currently sedated with propofol, but does open eyes to voice and track examiner. He previously had 2 syncopal episodes and was seen at [facility] last week with a hematoma on his coccyx. EMS does report seizure like activity on their arrival, he was however hypoxic and had agonal respirations. He did have a hypercoaguable work up done at [facility], however he was on coumadin prior to and a heparin drip during his hospitalization. Coumadin and aspirin resumed by CCM.    Latest Reference Range & Units 01/23/22 17:53 01/24/22 07:57  Anticardiolipin Ab,IgA,Qn 0 - 11 APL U/mL  <9  Anticardiolipin Ab,IgG,Qn 0 - 14 GPL U/mL  26 (H)  Anticardiolipin Ab,IgM,Qn 0 - 12 MPL U/mL  <9  PTT Lupus Anticoagulant 0.0 - 43.5 sec 52.1 (H)   DRVVT 0.0 - 47.0 sec 115.1 (H)   Lupus Anticoag Interp  Comment: (C)   Hexagonal Phase Phospholipid 0 - 11 sec 46 (H)   PTT-LA Mix 0.0 - 40.5 sec 44.4 (H)   Beta-2 Glycoprotein I Ab, IgG 0 - 20 GPI IgG units 14   Beta-2-Glycoprotein I IgA 0 - 25 GPI IgA units <9   Beta-2-Glycoprotein I IgM 0 - 32 GPI IgM units <9    (H): Data is abnormally high (C): Corrected   Latest Reference Range & Units 01/23/22 17:53  PTT Lupus Anticoagulant 0.0 - 43.5 sec 52.1 (H)  DRVVT 0.0 - 47.0 sec 115.1 (H)  dRVVT Mix 0.0 - 40.4 sec 62.2 (H)  dRVVT Confirm 0.8 - 1.2 ratio 2.0 (H)  Prothrombin Time 11.4 - 15.2 seconds 19.0 (H)  INR 0.8 - 1.2  1.6 (H)  APTT 24 - 36 seconds 46 (H)  PTT-LA Mix 0.0 - 40.5 sec 44.4 (H)  (H): Data is abnormally high     Impression:Seizure vs syncopal episode   Recommendations: - MRI Brain w wo - LTM in place, he does have MRI compatible leads in place - Wean sedation as able   -- Patient seen and examined by NP/APP with MD. MD to update note as needed.   Elmer Picker, DNP, FNP-BC [facility] Pager: [phone]   **This documentation was dictated using Dragon Medical Software and may contain inadvertent errors **  I have seen the patient and reviewed the above note.  He has had multiple episodes over the past week concerning for loss of consciousness.  There was report of possible seizure-like activity at home, but also possible that he was having cardiac arrest as he did receive CPR.  Of note his initial ABG also showed a PCO2 of 71.  At this point, with two episodes of unclear etiology so close together, I would favor further monitoring with EEG to try and capture one of these episodes.  If no episodes are captured, will need further discussion with patient/family to decide on further antiepileptic therapy.  An MRI could also be helpful.  With his history of vertebrobasilar insufficiency, I will also get a CTA head and neck.  Neurology will follow.  Ritta Slot, MD [facility] [phone]  If 7pm- 7am, please page neurology on call as listed in AMION.

## 2022-02-02 NOTE — Progress Notes (Signed)
ANTICOAGULATION CONSULT NOTE - Follow Up Consult  Pharmacy Consult for Warfarin Indication: PE/Antiphospholipid Syndrome  Not on File  Patient Measurements: Height: 5\' 10"  (177.8 cm) Weight: 118.7 kg (261 lb 11 oz) IBW/kg (Calculated) : 73  Vital Signs: Temp: 98.8 F (37.1 C) (11/20 0830) Temp Source: Bladder (11/20 0815) BP: 114/54 (11/20 0830) Pulse Rate: 60 (11/20 0830)  Labs: Recent Labs    02/02/22 0410 02/02/22 0417 02/02/22 0447 02/02/22 0653 02/02/22 0731  HGB 8.6* 9.9* 8.8*  --  9.2*  HCT 30.8* 29.0* 26.0*  --  27.0*  PLT 344  --   --   --   --   LABPROT 21.2*  --   --   --   --   INR 1.9*  --   --   --   --   CREATININE 1.32* 1.30*  --   --   --   TROPONINIHS 9  --   --  40*  --     Estimated Creatinine Clearance: 72.2 mL/min (A) (by C-G formula based on SCr of 1.3 mg/dL (H)).   Medications:  Scheduled:   arformoterol  15 mcg Nebulization BID   aspirin  81 mg Per Tube Daily   Chlorhexidine Gluconate Cloth  6 each Topical Q0600   famotidine  20 mg Per Tube BID   fentaNYL       gabapentin  600 mg Per Tube Q8H   insulin aspart  0-15 Units Subcutaneous Q4H   methylPREDNISolone (SOLU-MEDROL) injection  40 mg Intravenous Q24H   mupirocin ointment  1 Application Nasal BID   mouth rinse  15 mL Mouth Rinse Q2H   revefenacin  175 mcg Nebulization Daily   rosuvastatin  10 mg Per Tube Daily   Infusions:   fentaNYL infusion INTRAVENOUS 50 mcg/hr (02/02/22 0800)   lactated ringers 100 mL/hr at 02/02/22 0800   propofol (DIPRIVAN) infusion 30 mcg/kg/min (02/02/22 0800)    Assessment: Jose [name] admitted for syncope/AMS on warfarin prior to admission for history of PE and antiphospholipid syndrome. Recent admission for buttock hematoma and elevated INR. Pharmacy consulted to continue warfarin during admission.  Unable to obtain exact med history and last doses at this time. Wife does say patient instructed to take 5mg  daily after recent discharge.   INR 1.9 today  with CBC stable. No signs of bleeding.  Goal of Therapy:  INR 2-3 (previous discussion about aiming on low end given recurrent falls) Monitor platelets by anticoagulation protocol: Yes   Plan:  Warfarin 5mg  per tube once Daily INR  CBC every 72 hours  02/04/22, PharmD Clinical Pharmacist 02/02/2022,10:15 AM

## 2022-02-02 NOTE — ED Notes (Signed)
Pt with purposeful movement to bilateral upper extremities, Dr. Pilar Plate aware, advised to continue to titrate up on propofol. Soft wrist restraints applied.

## 2022-02-02 NOTE — Consult Note (Addendum)
Admitting Physician: Hyman Hopes Lari Linson  Service: Trauma Surgery  CC: Fall  Subjective   Mechanism of Injury: Jose [name] is an 66 y.o. male who presented as a level 1 trauma after a presumed fall.  He was found on his belly after a fall at home.  He takes blood thinners.  He appeared to have seizure activity and agonal respirations by the EMS so bag mask was used to support respiration in the ambulance.  He was admitted to [facility] a week ago after a syncopal episode.  He had a gluteal hematoma that was monitored by the surgery service there during that hospitalization.  PMHX 10/2015 Carotid arterial disease (HCC)  2011 Pulmonary emboli [facility]) Date Unknown Antiphospholipid antibody syndrome [facility]) '98, '04, Feb 2017 CAD S/P percutaneous coronary angioplasty  Date Unknown COPD (chronic obstructive pulmonary disease) (HCC) Date Unknown Coronary artery disease Date Unknown Hypertension Date Unknown Non-insulin treated type 2 diabetes mellitus (HCC) Date Unknown RBBB Date Unknown Sleep apnea  PSHX  03/18/2019 Incision and drainage of wound (Left)  03/18/2019 Application of wound vac (Left)  03/15/2019 I & d extremity (Left)  Date Unknown Appendectomy '89, '06 Back surgery Date Unknown Colonoscopy '98, '04, Feb 2017 Coronary angioplasty with stent placement    Fam HX Father (Deceased) CAD (25 y)    Soc  Smoking Status Former Quit 01/15/2019 Types Cigarettes quit in 01/15/2019 Amount 0.50 packs/day for 40.00 years; Total pack years: 20.00 Smokeless Tobacco Status Never Comment has started smoking again, willing to quit.  All  MelatoninAnxiety Altace [Ramipril]Cough  Meds acetaminophen (TYLENOL) 650 MG CR tablet  albuterol (PROVENTIL) (5 MG/ML) 0.5% nebulizer solution  albuterol (VENTOLIN HFA) 108 (90 Base) MCG/ACT inhaler aspirin EC 81 MG EC tablet  DULoxetine (CYMBALTA) 30 MG capsule  DULoxetine (CYMBALTA) 60 MG capsule  enoxaparin  (LOVENOX) 120 MG/0.8ML injection (Expired) Fluticasone-Umeclidin-Vilant (TRELEGY ELLIPTA) 200-62.5-25 MCG/ACT AEPB gabapentin (NEURONTIN) 600 MG tablet  glimepiride (AMARYL) 4 MG tablet ipratropium-albuterol (DUONEB) 0.5-2.5 (3) MG/3ML SOLN  metoprolol tartrate (LOPRESSOR) 12.5 mg TABS tablet  nitroGLYCERIN (NITROSTAT) 0.4 MG SL tablet OXYGEN pantoprazole (PROTONIX) 40 MG tablet  rosuvastatin (CRESTOR) 10 MG tablet senna-docusate (SENOKOT-S) 8.6-50 MG tablet tamsulosin (FLOMAX) 0.4 MG CAPS capsule  topiramate (TOPAMAX) 50 MG tablet torsemide (DEMADEX) 20 MG tablet  warfarin (COUMADIN) 7.5 MG tablet   Objective   Primary Survey: There were no vitals taken for this visit. Airway:  Agonal respirations Breathing: Bilateral breath sounds, breathing spontaneously Circulation: Stable, Palpable peripheral pulses Disability:  no movement ,   GCS Eyes: 1 - No eye opening  GCS Verbal: 1 - No verbal response  GCS Motor: 1 - no motor response  GCS 3  Environment/Exposure: Warm, dry  Secondary Survey: Head: Normocephalic, atraumatic Neck:  c-collar Chest: Bilateral breath sounds, chest wall stable Abdomen: Soft, non-tender, non-distended, lovenox injection site bruises Upper Extremities: Strength and sensation intact, palpable peripheral pulses Lower extremities: Strength and sensation intact, palpable peripheral pulses, stasis dermititis Back: No step offs or deformities, atraumatic Rectal:  Deferred Psych:  Obtunded  Interventions in the trauma bay:  Endotracheal Intubation Peripheral IV access  Trauma bay resuscitation: IVF  No results found for this or any previous visit (from the past 24 hour(s)).   Imaging Orders         DG Chest Port 1 View         DG Pelvis Portable         CT HEAD WO CONTRAST         CT  CERVICAL SPINE WO CONTRAST         CT CHEST ABDOMEN PELVIS W CONTRAST      INR 1.3 on 01/26/22  Assessment and Plan   Jose [name] is an 66 y.o. male  who presented as a level 1 trauma after a presumed fall.  Injuries: Right buttock hematoma - this is an injury that he was recently treated for at [facility].  Radiologist states it may be slightly larger on CT, no active extravasation.  I don't think it is necessary to hold anticoagulation if needed.  Syncopal episode or seizure - admit to medicine for workup   Consults:  Admit to medicine  Dispo - ICU    Quentin Ore, MD  [facility] Use [email or web address] to contact on call provider  New Patient Billing: [id number] - High MDM

## 2022-02-02 NOTE — H&P (Signed)
NAME:  Jose [name], MRN:  [id number], DOB:  [birth year], LOS: 0 ADMISSION DATE:  02/02/2022, CONSULTATION DATE:  02/02/22 REFERRING MD:  Pilar Plate, CHIEF COMPLAINT:  Fall   History of Present Illness:  Jose [name] is a 66 y.o. M with PMH of CAD s/p CABG, AVS, PVD, anti-phospholipid syndrome, COPD on 4-6L home O2 and hx non-compliance with trilogy, HL, PE and vertebrobasilar insufficiency causing recurrent syncope who was brought into the ED after being found face down at home after presumed fall.  He seemed to have seizure activity and agonal respirations so required intubation.  He was evaluated by Trauma and CT head/C-spine/abd and pelvis negative.  Labs showed creatinine 1.3, lactic acid 3.4, INR 1.9, trop 9, pH 7.2, pCO2 71.  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 Seizure activity, intubated  Interim History / Subjective:  Waking up, borderline low BP  Objective   Blood pressure (!) 106/49, pulse 72, temperature 98 F (36.7 C), resp. rate (!) 26, height 5\' 10"  (1.778 m), weight 110 kg, SpO2 100 %.    Vent Mode: PRVC FiO2 (%):  [50 %-100 %] 50 % Set Rate:  [20 bmp-26 bmp] 26 bmp Vt Set:  [580 mL] 580 mL PEEP:  [5 cmH20] 5 cmH20 Plateau Pressure:  [29 cmH20] 29 cmH20  No intake or output data in the 24 hours ending 02/02/22 0624 Filed Weights   02/02/22 0435  Weight: 110 kg     General:  obese, critically ill-appearing M, intubated and sedated HEENT: MM  pink/moist, sclera anicteric Neuro: opening eyes,following commands on propofol CV: s1s2 rrr, no m/r/g PULM:  mechanically ventilated without rhonchi or wheezing GI: soft, obese, R-sided hematomas Extremities: warm/dry, no edema, chronic venous stasis changes Skin: no rashes or lesions  Resolved Hospital Problem list     Assessment & Plan:   Acute hypoxic and hypercarbic respiratory failure secondary to fall and possible seizure activity History Vertebrobasilar insufficiency Now awake and responsive Trauma w/u negative -low suspicion PE as negative trop and Chest CT negative, though contrast not timed for PE study -Echo -EEG -Prn ativan and seizure precautions --Maintain full vent support with SAT/SBT as tolerated -titrate Vent setting to maintain SpO2 greater than or equal to 90%. -HOB elevated 30 degrees. -Plateau pressures less than 30 cm H20.  -Follow chest x-ray, ABG prn.   -Bronchial hygiene and RT/bronchodilator protocol.     AKI -IVF, follow renal indices, electolytes and UOP and avoid nephrotoxins     History of CAD and CABG -trop 9 -check Echo -hold home lopressor in the setting of borderline hypotension likely from sedation   Anti-phospholipid syndrome INR 1.9 -pharmacy consult for coumadin    Best Practice (right click and "Reselect all SmartList Selections" daily)   Diet/type: NPO DVT prophylaxis: Coumadin GI prophylaxis: PPI Lines: N/A Foley:  Yes, and it is still needed Code Status:  full code Last date of multidisciplinary goals of care discussion [pending]  Labs   CBC: Recent Labs  Lab 02/02/22 0417 02/02/22 0447  HGB 9.9* 8.8*  HCT 29.0* 26.0*  Basic Metabolic Panel: Recent Labs  Lab 02/02/22 0410 02/02/22 0417 02/02/22 0447  NA 145 145 143  K 4.8 4.7 4.1  CL 106 107  --   CO2 26  --   --   GLUCOSE 166* 166*  --   BUN 26* 28*  --   CREATININE 1.32* 1.30*  --   CALCIUM 8.5*  --   --    GFR: Estimated Creatinine  Clearance: 69.4 mL/min (A) (by C-G formula based on SCr of 1.3 mg/dL (H)). Recent Labs  Lab 02/02/22 0410  LATICACIDVEN 3.4*    Liver Function Tests: Recent Labs  Lab 02/02/22 0410  AST 36  ALT 37  ALKPHOS 69  BILITOT 0.3  PROT 7.3  ALBUMIN 3.4*   No results for input(s): "LIPASE", "AMYLASE" in the last 168 hours. No results for input(s): "AMMONIA" in the last 168 hours.  ABG    Component Value Date/Time   PHART 7.251 (L) 02/02/2022 0447   PCO2ART 71.8 (HH) 02/02/2022 0447   PO2ART 520 (H) 02/02/2022 0447   HCO3 31.9 (H) 02/02/2022 0447   TCO2 34 (H) 02/02/2022 0447   O2SAT 100 02/02/2022 0447     Coagulation Profile: Recent Labs  Lab 02/02/22 0410  INR 1.9*    Cardiac Enzymes: No results for input(s): "CKTOTAL", "CKMB", "CKMBINDEX", "TROPONINI" in the last 168 hours.  HbA1C: No results found for: "HGBA1C"  CBG: No results for input(s): "GLUCAP" in the last 168 hours.  Review of Systems:   Unable to obtain  Past Medical History:  He,  has no past medical history on file.   Surgical History:     Social History:      Family History:  His family history is not on file.   Allergies Not on File   Home Medications  Prior to Admission medications   Medication Sig Start Date End Date Taking? Authorizing Provider  albuterol (VENTOLIN HFA) 108 (90 Base) MCG/ACT inhaler Inhale into the lungs. 01/06/22  Yes [provider]  DULoxetine (CYMBALTA) 30 MG capsule Take 30 mg by mouth daily. 11/02/21  Yes [provider]  DULoxetine (CYMBALTA) 60 MG capsule Take 60 mg by mouth daily. 11/02/21  Yes [provider]  enoxaparin (LOVENOX) 120 MG/0.8ML injection Inject into the skin. 01/27/22  Yes [provider]  gabapentin (NEURONTIN) 600 MG tablet Take 600 mg by mouth 3 (three) times daily. 09/02/21  Yes [provider]  glimepiride (AMARYL) 4 MG tablet Take 4 mg by mouth 2 (two) times daily. 10/07/21  Yes [provider]  HYDROcodone-acetaminophen (NORCO/VICODIN) 5-325 MG tablet Take 1 tablet by mouth every 4 (four) hours as needed for severe pain or moderate pain. 01/26/22  Yes [provider]  metoprolol tartrate (LOPRESSOR) 25 MG tablet Take 25 mg by mouth daily. 10/07/21  Yes [provider]  pantoprazole (PROTONIX) 40 MG tablet Take 40 mg by mouth 2 (two) times daily. 12/05/21  Yes [provider]  rosuvastatin (CRESTOR) 10 MG tablet Take 10 mg by mouth at bedtime. 01/06/22  Yes [provider]  tamsulosin (FLOMAX) 0.4 MG CAPS capsule Take 0.8 mg by mouth daily. 01/06/22  Yes [provider]  tiZANidine (ZANAFLEX) 4 MG tablet Take 4 mg by mouth 3 (three) times daily. 01/06/22  Yes [provider]  topiramate (TOPAMAX) 50 MG tablet Take 50 mg by mouth 3 (three) times daily. 01/06/22  Yes [provider]     Critical care time: 40 minutes  CRITICAL CARE Performed by: Darcella Gasman Sharmin Foulk   Total critical care time: 40 minutes  Critical care time was exclusive of separately billable procedures and treating other patients.  Critical care was necessary to treat or prevent imminent or life-threatening deterioration.  Critical care was time spent personally by me on the following activities: development of treatment plan with patient and/or surrogate as well as nursing, discussions with consultants, evaluation of patient's response to treatment, examination of patient, obtaining history from patient or surrogate, ordering and performing treatments and interventions, ordering and review of laboratory studies, ordering and review of radiographic studies, pulse oximetry and re-evaluation of patient's condition.  Darcella Gasman Aparna Vanderweele, PA-C [facility] See Amion for pager If no response to pager , please call [phone] until 7pm After 7:00 pm call Elink  [phone]

## 2022-02-02 NOTE — ED Notes (Signed)
Pt given fentanyl IV per verbal order CCM.

## 2022-02-02 NOTE — Progress Notes (Signed)
LTM EEG hooked up and running MRI Leads used - no initial skin breakdown - push button tested - [facility] monitoring.

## 2022-02-02 NOTE — Procedures (Signed)
Patient Name: Jose [name]  MRN: [id number]  Epilepsy Attending: Charlsie Quest  Referring Physician/Provider: Gleason, Darcella Gasman, PA-C  Date: 02/02/2022 Duration: 23.36 mins  Patient history: 66 year old male with altered mental status.  EEG to evaluate for seizure.  Level of alertness: comatose  AEDs during EEG study: GBP, Propofol  Technical aspects: This EEG study was done with scalp electrodes positioned according to the 10-20 International system of electrode placement. Electrical activity was reviewed with band pass filter of 1-70Hz , sensitivity of 7 uV/mm, display speed of 23mm/sec with a 60Hz  notched filter applied as appropriate. EEG data were recorded continuously and digitally stored.  Video monitoring was available and reviewed as appropriate.  Description: EEG showed continuous generalized 3 to 6 Hz theta-delta slowing admixed with 15 to 18 Hz beta activity distributed symmetrically and diffusely. Hyperventilation and photic stimulation were not performed.     ABNORMALITY - Continuous slow, generalized  IMPRESSION: This study is suggestive of severe diffuse encephalopathy, nonspecific etiology. No seizures or epileptiform discharges were seen throughout the recording.  Gianluca Chhim 

## 2022-02-02 NOTE — Progress Notes (Signed)
EEG complete - results pending 

## 2022-02-02 NOTE — Progress Notes (Signed)
LTM maint complete - no skin breakdown  Serviced P3 P4 [facility] monitored, Event button test confirmed by [facility].

## 2022-02-02 NOTE — Progress Notes (Signed)
Wife updated on MRI results.  Steffanie Dunn, DO 02/02/22 6:45 PM [facility]

## 2022-02-02 NOTE — TOC Initial Note (Incomplete)
Transition of Care [facility]) - Initial/Assessment Note    Patient Details  Name: Jose [name] MRN: [id number] Date of Birth: [birth year]  Transition of Care [facility]) CM/SW Contact:    Tom-Johnson, Hershal Coria, RN Phone Number: 02/02/2022, 1:16 PM  Clinical Narrative:                  Patient is admitted for AMS, currently intubated, on continuous EEG. Patient was recently admitted at [facility] a week ago after a Syncopal episode and found to have a Gluteal Hematoma, Surgery was following. Patient is on Coumadin for Hx of PE and Anti Phospholipid Syndrome.  CM spoke with patient's wife, Cardell Peach at bedside as patient is not able to communicate at this time. Gay states patient was home when they heard a sound and found patient on the floor. EMS was called and brought to the hospital. Patient has three supportive children, retired, drive self sometimes. Has a cane, rollator and home O2 and Trilogy from [place]. PCP is Print production planner and uses [facility] in [place].  Patient is currently active with [facility] for Home health disciplines, Morrie Sheldon notified of admission.  CM will continue to follow as patient progresses with care.     Barriers to Discharge: Continued Medical Work up   Patient Goals and CMS Choice Patient states their goals for this hospitalization and ongoing recovery are:: To return home      Expected Discharge Plan and Services     Discharge Planning Services: CM Consult   Living arrangements for the past 2 months: Single Family Home                                      Prior Living Arrangements/Services Living arrangements for the past 2 months: Single Family Home Lives with:: Spouse Patient language and need for interpreter reviewed:: Yes Do you feel safe going back to the place where you live?: Yes      Need for Family Participation in Patient Care: Yes (Comment) Care giver support system in place?: Yes (comment) Current home services: DME (Home O2 from [employer], cane,  rollator) Criminal Activity/Legal Involvement Pertinent to Current Situation/Hospitalization: No - Comment as needed  Activities of Daily Living      Permission Sought/Granted Permission sought to share information with : Case Manager, Family Supports Permission granted to share information with : Yes, Verbal Permission Granted              Emotional Assessment Appearance:: Appears stated age Attitude/Demeanor/Rapport: Unable to Assess (Intubated) Affect (typically observed): Unable to Assess (Intubated) Orientation: : Oriented to Self Alcohol / Substance Use: Not Applicable Psych Involvement: No (comment)  Admission diagnosis:  Altered mental status [R41.82] Anticoagulated [Z79.01] Unresponsive [R41.89] Fall, initial encounter [W19.XXXA] Altered mental status, unspecified altered mental status type [R41.82] Patient Active Problem List   Diagnosis Date Noted   Altered mental status 02/02/2022   Fall 02/02/2022   Chronic respiratory failure with hypoxia and hypercapnia (HCC) 02/02/2022   PCP:  Pcp, No Pharmacy:   [facility] [address] [facility] - [place], [place] - [address] AT [address] [place] [address] Phone: [phone] Fax: [phone]     Social Determinants of Health (SDOH) Interventions    Readmission Risk Interventions     No data to display

## 2022-02-02 NOTE — ED Provider Notes (Signed)
[facility]-EMERGENCY DEPT [facility] Emergency Department Provider Note MRN:  [id number]  Arrival date & time: 02/02/22     Chief Complaint   Fall  History of Present Illness   Jose [name] is a 66 y.o. year-old male with a history of antiphospholipid, PE presenting to the ED with chief complaint of fall.  Reportedly an unwitnessed fall and unresponsive at home, presenting as level 1 trauma.  Evidence of posturing en route, requiring BVM.  Review of Systems  A thorough review of systems was obtained and all systems are negative except as noted in the HPI and PMH.   Patient's Health History   No past medical history on file.    No family history on file.  Social History   Socioeconomic History   Marital status: Married    Spouse name: Not on file   Number of children: Not on file   Years of education: Not on file   Highest education level: Not on file  Occupational History   Not on file  Tobacco Use   Smoking status: Not on file   Smokeless tobacco: Not on file  Substance and Sexual Activity   Alcohol use: Not on file   Drug use: Not on file   Sexual activity: Not on file  Other Topics Concern   Not on file  Social History Narrative   Not on file   Social Determinants of Health   Financial Resource Strain: Not on file  Food Insecurity: Not on file  Transportation Needs: Not on file  Physical Activity: Not on file  Stress: Not on file  Social Connections: Not on file  Intimate Partner Violence: Not on file     Physical Exam   Vitals:   02/02/22 0515 02/02/22 0530  BP: (!) 167/90   Pulse: 87 81  Resp: (!) 26 (!) 25  Temp: 97.7 F (36.5 C) 97.7 F (36.5 C)  SpO2: 100% 100%    CONSTITUTIONAL:  ill-appearing, NAD NEURO/PSYCH:  GCS 3 unresponsive EYES:  47mm minimally reactive ENT/NECK:  no LAD, no JVD CARDIO:  regular rate, well-perfused, normal S1 and S2 PULM:  rhonchi GI/GU:  non-distended, non-tender MSK/SPINE:  No gross deformities, no  edema SKIN:  no rash, atraumatic   *Additional and/or pertinent findings included in MDM below  Diagnostic and Interventional Summary    EKG Interpretation  Date/Time:  Monday February 02 2022 04:14:29 EST Ventricular Rate:  99 PR Interval:  156 QRS Duration: 172 QT Interval:  419 QTC Calculation: 538 R Axis:   72 Text Interpretation: Sinus rhythm Left atrial enlargement Right bundle branch block No significant change was found Confirmed by Kennis Carina [phone]) on 02/02/2022 4:23:57 AM       Labs Reviewed  COMPREHENSIVE METABOLIC PANEL - Abnormal; Notable for the following components:      Result Value   Glucose, Bld 166 (*)    BUN 26 (*)    Creatinine, Ser 1.32 (*)    Calcium 8.5 (*)    Albumin 3.4 (*)    GFR, Estimated 59 (*)    All other components within normal limits  URINALYSIS, ROUTINE W REFLEX MICROSCOPIC - Abnormal; Notable for the following components:   Protein, ur 100 (*)    Bacteria, UA RARE (*)    All other components within normal limits  LACTIC ACID, PLASMA - Abnormal; Notable for the following components:   Lactic Acid, Venous 3.4 (*)    All other components within normal limits  PROTIME-INR - Abnormal; Notable  for the following components:   Prothrombin Time 21.2 (*)    INR 1.9 (*)    All other components within normal limits  I-STAT CHEM 8, ED - Abnormal; Notable for the following components:   BUN 28 (*)    Creatinine, Ser 1.30 (*)    Glucose, Bld 166 (*)    Calcium, Ion 1.09 (*)    Hemoglobin 9.9 (*)    HCT 29.0 (*)    All other components within normal limits  I-STAT ARTERIAL BLOOD GAS, ED - Abnormal; Notable for the following components:   pH, Arterial 7.251 (*)    pCO2 arterial 71.8 (*)    pO2, Arterial 520 (*)    Bicarbonate 31.9 (*)    TCO2 34 (*)    Acid-Base Excess 3.0 (*)    HCT 26.0 (*)    Hemoglobin 8.8 (*)    All other components within normal limits  ETHANOL  CBC  BRAIN NATRIURETIC PEPTIDE  SAMPLE TO BLOOD BANK   TROPONIN I (HIGH SENSITIVITY)    DG Pelvis Portable  Final Result    CT HEAD WO CONTRAST  Final Result    CT CERVICAL SPINE WO CONTRAST  Final Result    CT CHEST ABDOMEN PELVIS W CONTRAST  Final Result    DG Chest Port 1 View  Final Result      Medications  propofol (DIPRIVAN) 1000 MG/100ML infusion (40 mcg/kg/min Intravenous Rate/Dose Change 02/02/22 0534)  iohexol (OMNIPAQUE) 350 MG/ML injection 75 mL (75 mLs Intravenous Contrast Given 02/02/22 0424)     Procedures  /  Critical Care .Critical Care  Performed by: Sabas Sous, MD Authorized by: Sabas Sous, MD   Critical care provider statement:    Critical care time (minutes):  45   Critical care was necessary to treat or prevent imminent or life-threatening deterioration of the following conditions:  CNS failure or compromise   Critical care was time spent personally by me on the following activities:  Development of treatment plan with patient or surrogate, discussions with consultants, evaluation of patient's response to treatment, examination of patient, ordering and review of laboratory studies, ordering and review of radiographic studies, ordering and performing treatments and interventions, pulse oximetry, re-evaluation of patient's condition and review of old charts Procedure Name: Intubation Date/Time: 02/02/2022 4:20 AM  Performed by: Sabas Sous, MDPre-anesthesia Checklist: Patient identified, Patient being monitored, Emergency Drugs available, Timeout performed and Suction available Oxygen Delivery Method: Non-rebreather mask Preoxygenation: Pre-oxygenation with 100% oxygen Induction Type: Rapid sequence Ventilation: Mask ventilation without difficulty Laryngoscope Size: Glidescope and 4 Grade View: Grade II Tube size: 8.0 mm Number of attempts: 1 Airway Equipment and Method: Rigid stylet Placement Confirmation: ETT inserted through vocal cords under direct vision, CO2 detector and Breath sounds  checked- equal and bilateral Secured at: 25 cm Tube secured with: ETT holder Comments: RSI with 10mg  etomidate and 100mg  rocuronium      ED Course and Medical Decision Making  Initial Impression and Ddx Fall, anticoagulated, now with GCS 3 and agonally breathing.  Mild tachycardia, hypertensive.  Differential diagnosis includes intracranial bleeding from trauma, seizure and postictal state, arrhythmia, electrolyte disturbance  Past medical/surgical history that increases complexity of ED encounter: Antiphospholipid syndrome, PE, vertebrobasilar insufficiency causing recurrent syncope  Interpretation of Diagnostics I personally reviewed the EKG and my interpretation is as follows: Bundle branch block, no significant change from prior  Labs reveal CO2 retention with some mild acidosis  Patient Reassessment and Ultimate Disposition/Management  Patient improving somewhat in GCS status, now having some purposeful upper extremity movements.  Trauma will follow in consultation given that patient has a recent hematoma to the buttocks that he was admitted for.  But no new evidence of trauma and so patient will be admitted to ICU.  Patient management required discussion with the following services or consulting groups:  Intensivist Service and General/Trauma Surgery  Complexity of Problems Addressed Acute illness or injury that poses threat of life of bodily function  Additional Data Reviewed and Analyzed Further history obtained from: EMS on arrival  Additional Factors Impacting ED Encounter Risk Consideration of hospitalization and Major procedures  Elmer Sow. Pilar Plate, MD [facility] Emergency Medicine [facility] [email or web address]  Final Clinical Impressions(s) / ED Diagnoses     ICD-10-CM   1. Fall, initial encounter  W19.XXXA     2. Anticoagulated  Z79.01     3. Unresponsive  R41.89     4. Altered mental status, unspecified altered mental status type   R41.82       ED Discharge Orders     None        Discharge Instructions Discussed with and Provided to Patient:   Discharge Instructions   None      Sabas Sous, MD 02/02/22 [phone]

## 2022-02-02 NOTE — ED Notes (Signed)
Trauma Response Nurse Documentation   Jose [name] is a 66 y.o. male arriving to [facility] ED via [facility]  On No antithrombotic. Trauma was activated as a Level 1 by Rande Brunt based on the following trauma criteria GCS < 9. Trauma team at the bedside on patient arrival.   Patient cleared for CT by Dr. Royanne Foots. Pt transported to CT with trauma response nurse present to monitor. RN remained with the patient throughout their absence from the department for clinical observation.   GCS 3.  History   No past medical history on file.        Initial Focused Assessment (If applicable, or please see trauma documentation): Airway-- intact, maintained via nasal trumpet, jaw thrust Breathing-- EMS assisting ventilations via BVM on arrival Circulation-- no bleeding noted  CT's Completed:   CT Head, CT C-Spine, CT Chest w/ contrast, and CT abdomen/pelvis w/ contrast   Interventions:  See event summary  Plan for disposition:  Admission to ICU   Consults completed:  none at 0500.  Event Summary: Patient brought in by [facility] from home. Patient was found down, supine, unresponsive. EMS initially assisting ventilations. EMS reports en route patient breathing on own. During transport, EMS has to restart assisting ventilations. Upon arrival to department, patient transferred to hospital stretcher. 18 G PIV RAC established. Patient intubated by EDP, 10 etomidate, 100 rocuronium used for RSI. Xray chest and pelvis completed. Patient transported to CT with TRN, RRT, Trauma MD, [place]ulte. CT head, c-spine, chest/abdomen/pelvis completed. Patient returned to exam room. 16 F orogastric tube placed by TRN. 16 F temp foley placed by TRN.    Bedside handoff with ED RN Carmie Kanner.    Leota Sauers  Trauma Response RN  Please call TRN at [phone] for further assistance.

## 2022-02-02 NOTE — Progress Notes (Signed)
Orthopedic Tech Progress Note Patient Details:  Jose [name] [birth year] [id number]  Patient ID: [name], male   DOB: 06/28/1955, 66 y.o.   MRN: [id number] I attended trauma page. Trinna Post 02/02/2022, 6:42 AM

## 2022-02-02 NOTE — Progress Notes (Signed)
RT NOTE:  Critical ABG results reported to Dr. Pilar Plate.  RR increased to 26

## 2022-02-02 NOTE — Progress Notes (Signed)
Pt transported to MRI and back to 54M 2 on full vent support. No complications noted.

## 2022-02-03 ENCOUNTER — Inpatient Hospital Stay (HOSPITAL_COMMUNITY): Payer: Medicare Other

## 2022-02-03 DIAGNOSIS — R4189 Other symptoms and signs involving cognitive functions and awareness: Secondary | ICD-10-CM | POA: Diagnosis not present

## 2022-02-03 DIAGNOSIS — R55 Syncope and collapse: Secondary | ICD-10-CM

## 2022-02-03 DIAGNOSIS — J9601 Acute respiratory failure with hypoxia: Secondary | ICD-10-CM

## 2022-02-03 DIAGNOSIS — Z9911 Dependence on respirator [ventilator] status: Secondary | ICD-10-CM

## 2022-02-03 LAB — ECHOCARDIOGRAM COMPLETE
AR max vel: 0.94 cm2
AV Area VTI: 0.8 cm2
AV Area mean vel: 0.97 cm2
AV Mean grad: 33 mmHg
AV Peak grad: 53.6 mmHg
Ao pk vel: 3.66 m/s
Area-P 1/2: 4.26 cm2
Calc EF: 56.8 %
Height: 70 in
S' Lateral: 4.99 cm
Single Plane A2C EF: 57.4 %
Single Plane A4C EF: 57.9 %
Weight: 3830.71 oz

## 2022-02-03 LAB — COMPREHENSIVE METABOLIC PANEL
ALT: 24 U/L (ref 0–44)
AST: 19 U/L (ref 15–41)
Albumin: 2.9 g/dL — ABNORMAL LOW (ref 3.5–5.0)
Alkaline Phosphatase: 60 U/L (ref 38–126)
Anion gap: 11 (ref 5–15)
BUN: 22 mg/dL (ref 8–23)
CO2: 24 mmol/L (ref 22–32)
Calcium: 8.6 mg/dL — ABNORMAL LOW (ref 8.9–10.3)
Chloride: 107 mmol/L (ref 98–111)
Creatinine, Ser: 0.81 mg/dL (ref 0.61–1.24)
GFR, Estimated: >60 mL/min (ref >60)
Glucose, Bld: 110 mg/dL — ABNORMAL HIGH (ref 70–99)
Potassium: 3.3 mmol/L — ABNORMAL LOW (ref 3.5–5.1)
Sodium: 142 mmol/L (ref 135–145)
Total Bilirubin: 0.4 mg/dL (ref 0.3–1.2)
Total Protein: 6.4 g/dL — ABNORMAL LOW (ref 6.5–8.1)

## 2022-02-03 LAB — PHOSPHORUS
Phosphorus: 4.3 mg/dL (ref 2.5–4.6)
Phosphorus: 2.7 mg/dL (ref 2.5–4.6)

## 2022-02-03 LAB — GLUCOSE, CAPILLARY
Glucose-Capillary: 105 mg/dL — ABNORMAL HIGH (ref 70–99)
Glucose-Capillary: 105 mg/dL — ABNORMAL HIGH (ref 70–99)
Glucose-Capillary: 196 mg/dL — ABNORMAL HIGH (ref 70–99)
Glucose-Capillary: 253 mg/dL — ABNORMAL HIGH (ref 70–99)
Glucose-Capillary: 206 mg/dL — ABNORMAL HIGH (ref 70–99)
Glucose-Capillary: 152 mg/dL — ABNORMAL HIGH (ref 70–99)

## 2022-02-03 LAB — MAGNESIUM
Magnesium: 1.9 mg/dL (ref 1.7–2.4)
Magnesium: 1.9 mg/dL (ref 1.7–2.4)

## 2022-02-03 LAB — PROTIME-INR
INR: 2.2 — ABNORMAL HIGH (ref 0.8–1.2)
Prothrombin Time: 24.6 seconds — ABNORMAL HIGH (ref 11.4–15.2)

## 2022-02-03 MED ORDER — POTASSIUM CHLORIDE 20 MEQ PO PACK
40.0000 meq | PACK | Freq: Once | ORAL | Status: AC
  Administered 2022-02-03: 40 meq
  Filled 2022-02-03: qty 2

## 2022-02-03 MED ORDER — PIPERACILLIN-TAZOBACTAM 3.375 G IVPB
3.3750 g | Freq: Three times a day (TID) | INTRAVENOUS | Status: DC
  Administered 2022-02-03 – 2022-02-04 (×5): 3.375 g via INTRAVENOUS
  Filled 2022-02-03 (×6): qty 50

## 2022-02-03 MED ORDER — WARFARIN SODIUM 5 MG PO TABS: 5.0000 mg | ORAL_TABLET | Freq: Once | ORAL | Status: DC

## 2022-02-03 MED ORDER — IOHEXOL 350 MG/ML SOLN
75.0000 mL | Freq: Once | INTRAVENOUS | Status: AC | PRN
  Administered 2022-02-03: 75 mL via INTRAVENOUS

## 2022-02-03 MED ORDER — ACETAMINOPHEN 325 MG PO TABS
650.0000 mg | ORAL_TABLET | Freq: Four times a day (QID) | ORAL | Status: DC | PRN
  Filled 2022-02-03: qty 2

## 2022-02-03 MED ORDER — ACETAMINOPHEN 325 MG PO TABS
650.0000 mg | ORAL_TABLET | Freq: Four times a day (QID) | ORAL | Status: DC | PRN
  Administered 2022-02-03 – 2022-02-04 (×2): 650 mg
  Filled 2022-02-03: qty 2

## 2022-02-03 MED ORDER — PERFLUTREN LIPID MICROSPHERE
1.0000 mL | INTRAVENOUS | Status: AC | PRN
  Administered 2022-02-03: 5 mL via INTRAVENOUS

## 2022-02-03 MED ORDER — WARFARIN SODIUM 5 MG PO TABS
5.0000 mg | ORAL_TABLET | Freq: Once | ORAL | Status: AC
  Administered 2022-02-03: 5 mg
  Filled 2022-02-03: qty 1

## 2022-02-03 NOTE — Progress Notes (Signed)
Pharmacy Antibiotic Note  Adonay Scheier is a 66 y.o. male admitted on 02/02/2022 with presumed fall.  Pharmacy has been consulted for zosyn dosing.  Plan: Zosyn 3.375g IV q8h (extended infusion) Monitor renal function, cultures, and clinical progression   Height: 5\' 10"  (177.8 cm) Weight: 108.6 kg (239 lb 6.7 oz) IBW/kg (Calculated) : 73  Temp (24hrs), Avg:99.5 F (37.5 C), Min:98.8 F (37.1 C), Max:100.2 F (37.9 C)  Recent Labs  Lab 02/02/22 0410 02/02/22 0417 02/03/22 0617  WBC 16.7*  --   --   CREATININE 1.32* 1.30* 0.81  LATICACIDVEN 3.4*  --   --     Estimated Creatinine Clearance: 110.6 mL/min (by C-G formula based on SCr of 0.81 mg/dL).    No Known Allergies  Antimicrobials this admission: Zosyn 11/21 >>   Dose adjustments this admission:   Microbiology results: 11/21 TA:   Thank you for allowing pharmacy to be a part of this patient's care.  12/21, PharmD, BCPS Clinical Pharmacist 02/03/2022 7:42 AM

## 2022-02-03 NOTE — Progress Notes (Signed)
LTM maint complete - no skin breakdown under:  Fp1 F3 F7 F8 [facility] monitored, Event button test confirmed by [facility].  

## 2022-02-03 NOTE — Progress Notes (Signed)
Subjective: MRI and EEG are negative  Exam: Vitals:   02/03/22 0721 02/03/22 0752  BP:  (!) 112/56  Pulse:    Resp:  (!) 26  Temp: 99.7 F (37.6 C)   SpO2:     Gen: In bed, intubated  Neuro: MS: eyes open to voice, with repeated stimulation, follows commands HG:DJME are midline, does not look left or right to command, resiss eye opening. PERRL Motor: follows command to wiggle thumb and toes bilaterally Sensory: responds to stim x 4   Pertinent Labs: Na 142 Cr 0.81  Impression: 66 year old male status post episode of unresponsiveness of unclear etiology.  CPR was performed, though unclear if true cardiac arrest.  pCO2 was 71 on admission.  Possibilities include seizure, hypercarbia, cardiac arrest, syncope.  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  Currently I would favor weaning sedation and continuing EEG monitoring while we do.  Recommendations: 1) would favor minimizing sedation 2) continue EEG 3) neurology will follow  Ritta Slot, MD [facility] [phone]  If 7pm- 7am, please page neurology on call as listed in AMION.

## 2022-02-03 NOTE — Procedures (Addendum)
Patient Name: Jose [name]  MRN: [id number]  Epilepsy Attending: Charlsie Quest  Referring Physician/Provider: Steffanie Dunn, DO  Duration: 02/02/2022 1055 to 02/03/2022 1055   Patient history: 66 year old male with altered mental status.  EEG to evaluate for seizure.   Level of alertness: comatose   AEDs during EEG study: GBP, TPM, Propofol   Technical aspects: This EEG study was done with scalp electrodes positioned according to the 10-20 International system of electrode placement. Electrical activity was reviewed with band pass filter of 1-70Hz , sensitivity of 7 uV/mm, display speed of 64mm/sec with a 60Hz  notched filter applied as appropriate. EEG data were recorded continuously and digitally stored.  Video monitoring was available and reviewed as appropriate.   Description: EEG showed continuous generalized 3 to 6 Hz theta-delta slowing admixed with intermittent generalized 15 to 18 Hz beta activity. Hyperventilation and photic stimulation were not performed.      ABNORMALITY - Continuous slow, generalized   IMPRESSION: This study is suggestive of severe diffuse encephalopathy, nonspecific etiology but likely due to sedation. No seizures or epileptiform discharges were seen throughout the recording.   Kalven Ganim 

## 2022-02-03 NOTE — Progress Notes (Signed)
ANTICOAGULATION CONSULT NOTE - Follow Up Consult  Pharmacy Consult for Warfarin Indication: PE/Antiphospholipid Syndrome  No Known Allergies  Patient Measurements: Height: 5\' 10"  (177.8 cm) Weight: 108.6 kg (239 lb 6.7 oz) IBW/kg (Calculated) : 73  Vital Signs: Temp: 99.5 F (37.5 C) (11/21 0630) BP: 121/60 (11/21 0630) Pulse Rate: 63 (11/21 0630)  Labs: Recent Labs    02/02/22 0410 02/02/22 0417 02/02/22 0447 02/02/22 0653 02/02/22 0731 02/02/22 0814 02/03/22 0617  HGB 8.6* 9.9* 8.8*  --  9.2*  --   --   HCT 30.8* 29.0* 26.0*  --  27.0*  --   --   PLT 344  --   --   --   --   --   --   LABPROT 21.2*  --   --   --   --   --  24.6*  INR 1.9*  --   --   --   --   --  2.2*  CREATININE 1.32* 1.30*  --   --   --   --  0.81  CKTOTAL  --   --   --   --   --  40*  --   TROPONINIHS 9  --   --  40*  --   --   --      Estimated Creatinine Clearance: 110.6 mL/min (by C-G formula based on SCr of 0.81 mg/dL).   Medications:  Scheduled:   arformoterol  15 mcg Nebulization BID   aspirin  81 mg Per Tube Daily   Chlorhexidine Gluconate Cloth  6 each Topical Q0600   famotidine  20 mg Per Tube BID   feeding supplement (PROSource TF20)  60 mL Per Tube BID   gabapentin  600 mg Per Tube Q8H   insulin aspart  0-15 Units Subcutaneous Q4H   methylPREDNISolone (SOLU-MEDROL) injection  40 mg Intravenous Q24H   mupirocin ointment  1 Application Nasal BID   mouth rinse  15 mL Mouth Rinse Q2H   revefenacin  175 mcg Nebulization Daily   rosuvastatin  10 mg Per Tube Daily   topiramate  75 mg Per Tube BID   Warfarin - Pharmacist Dosing Inpatient   Does not apply q1600   Infusions:   feeding supplement (VITAL 1.5 CAL) 55 mL/hr at 02/03/22 0600   fentaNYL infusion INTRAVENOUS 125 mcg/hr (02/03/22 0600)   lactated ringers 100 mL/hr at 02/03/22 0600   propofol (DIPRIVAN) infusion 40 mcg/kg/min (02/03/22 02/05/22)    Assessment: 66 YOM admitted for syncope/AMS on warfarin prior to admission  for history of PE and antiphospholipid syndrome. Recent admission for buttock hematoma and elevated INR. Pharmacy consulted to continue warfarin during admission.  Unable to obtain exact med history and last doses at this time. Wife does say patient instructed to take 5mg  daily after recent discharge.   Today, INR 2.2 is therapeutic. No bleeding noted per RN.   Goal of Therapy:  INR 2-3 (previous discussion about aiming on low end given recurrent falls) Monitor platelets by anticoagulation protocol: Yes   Plan:  Warfarin 5mg  x1 Daily INR  CBC every 72 hours  [id number], PharmD, BCPS Clinical Pharmacist 02/03/2022 7:06 AM

## 2022-02-03 NOTE — Progress Notes (Signed)
RT pulled ETT back from 27 to 25 at the lip per MD order. No complications noted.

## 2022-02-03 NOTE — Progress Notes (Signed)
NAME:  Jose [name], MRN:  [id number], DOB:  24-Jun-1955, LOS: 1 ADMISSION DATE:  02/02/2022, CONSULTATION DATE:  02/03/22 REFERRING MD:  Pilar Plate, CHIEF COMPLAINT:  Fall   History of Present Illness:  Jose [name] is a 66 y.o. M with PMH of CAD s/p CABG, AVS, PVD, anti-phospholipid syndrome, COPD on 4-6L home O2 and hx non-compliance with trilogy, HL, PE and vertebrobasilar insufficiency causing recurrent syncope who was brought into the ED after being found face down at home after presumed fall.  He seemed to have seizure activity and agonal respirations so required intubation.  He was evaluated by Trauma and CT head/C-spine/abd and pelvis negative.  Labs showed creatinine 1.3, lactic acid 3.4, INR 1.9, trop 9, pH 7.2, pCO2 71.  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 Seizure activity, intubated  Interim History / Subjective:  Tmax 100.2.   Objective   Blood pressure 121/60, pulse 63, temperature 99.7 F (37.6 C), resp. rate (!) 26, height 5\' 10"  (1.778 m), weight 108.6 kg, SpO2 100 %.    Vent Mode: PRVC FiO2 (%):  [40 %] 40 % Set Rate:  [26 bmp] 26 bmp Vt Set:  [580 mL] 580 mL PEEP:  [5 cmH20] 5 cmH20 Plateau Pressure:  [22 cmH20-23 cmH20] 22 cmH20   Intake/Output Summary (Last 24 hours) at 02/03/2022 0726 Last data filed at 02/03/2022 0600 Gross per 24 hour  Intake 4756.39 ml  Output 2760 ml  Net 1996.39 ml    Filed Weights   02/02/22 0435 02/02/22 0719  02/03/22 0500  Weight: 110 kg 118.7 kg 108.6 kg     General:  critically ill appearing man lying in bed intubated, sedated on MV Neck: c-collar in place HEENT: [place]/AT, eyes anicteric Neuro: RASS -5, PERRL, +cough, intact corneal reflex CV: S1S2, RRR PULM:  no wheezing, thick tan secretions, rhonchi on R GI: obese, soft, NT GU: foley  Extremities: mild edema, no cyanosis Skin: warm, very dry feet, no rashes  EEG pending  CT head & neck: no large vessel occlusions, calcified R V4 segment.  K+ 3.3 BUN 22 Cr 0.81 INR 2.2  Resolved Hospital Problem list     Assessment & Plan:   Acute hypoxic and hypercarbic respiratory failure secondary to fall and possible seizure activity Ongoing tobacco absue AE COPD Aspiration pneumonia RLL -retract ETT per Ct scan -LTVV -VAP prevention protocol -collect trach aspirate, empiric zosyn for aspiration pneumonia -PAD protocol -daily SAT & SBT as appropriate -bronchodilators, steroids for COPD -Bronchodilators, steroids.   -needs to quit smoking; will counsel when awake -follows with [facility]  Syncope, possible cardiac arrest by bystanders  -appreciate neurology's management; continuous EEG  History of vertebrobasilar insufficiency; CT with calcified posterior but patent vessels  -maintain adequate perfusion  AKI -strict I/Os -renally dose meds, avoid nephrotoxic meds -foley -Continue to monitor  BPH -hold PTA tamsulosin, may eventually need prazosin to get foley out until he can take tamsulosin PO  History of CAD and CABG -repeat echo ordered -with low diastolic Bps and ongoing need for sedation, will hold  metoprolol -con't rosuvastatin -aspirin daily  Anti-phospholipid syndrome -Continue Coumadin with goal INR 2-3.  Dosing per pharmacy. -Per evaluation at [facility], some question of this diagnosis and they were re-evaluating. Recommend follow up OP.  Hyperglycemia-controlled with minimal insulin requirements -SSI as  needed - Continue holding PTA glimepiride -goal BG 140-180  History of seizures  -Continue PTA gabapentin - Continuous EEG - Appreciate neurology's management.  As an outpatient follows with [place] [facility].  Found down -trauma reviewed imaging, C-spine ok to clear; ok to d/c collar  Best Practice (right click and "Reselect all SmartList Selections" daily)   Diet/type: tubefeeds  DVT prophylaxis: Coumadin GI prophylaxis: PPI Lines: N/A Foley:  Yes, and it is still needed Code Status:  full code Last date of multidisciplinary goals of care discussion [wife updated 11/20]  Labs   CBC: Recent Labs  Lab 02/02/22 0410 02/02/22 0417 02/02/22 0447 02/02/22 0731  WBC 16.7*  --   --   --   HGB 8.6* 9.9* 8.8* 9.2*  HCT 30.8* 29.0* 26.0* 27.0*  MCV 102.3*  --   --   --   PLT 344  --   --   --      Basic Metabolic Panel: Recent Labs  Lab 02/02/22 0410 02/02/22 0417 02/02/22 0447 02/02/22 0731 02/02/22 1552 02/03/22 0617  NA 145 145 143 144  --  142  K 4.8 4.7 4.1 4.1  --  3.3*  CL 106 107  --   --   --  107  CO2 26  --   --   --   --  24  GLUCOSE 166* 166*  --   --   --  110*  BUN 26* 28*  --   --   --  22  CREATININE 1.32* 1.30*  --   --   --  0.81  CALCIUM 8.5*  --   --   --   --  8.6*  MG  --   --   --   --  1.9 1.9  PHOS  --   --   --   --  1.5* 4.3    GFR: Estimated Creatinine Clearance: 110.6 mL/min (by C-G formula based on SCr of 0.81 mg/dL). Recent Labs  Lab 02/02/22 0410  WBC 16.7*  LATICACIDVEN 3.4*      This patient is critically ill with multiple organ system failure which requires frequent high complexity decision making, assessment, support, evaluation, and titration of therapies. This was completed through the application of advanced monitoring technologies and extensive interpretation of multiple databases. During this encounter critical care time was devoted to patient care services described in this note for 41 minutes.  Julian Hy, DO  02/03/22 9:04 AM [facility]  For contact information, see Amion. If no response to pager, please call PCCM consult pager. After hours, 7PM- 7AM, please call Elink.

## 2022-02-03 NOTE — Progress Notes (Signed)
Progress Note     Subjective: Sedated on the vent with continuous EEG monitoring.  Objective: Vital signs in last 24 hours: Temp:  [98.8 F (37.1 C)-100.2 F (37.9 C)] 99.7 F (37.6 C) (11/21 0721) Pulse Rate:  [59-86] 63 (11/21 0630) Resp:  [13-26] 26 (11/21 0630) BP: (104-150)/(45-102) 121/60 (11/21 0630) SpO2:  [87 %-100 %] 100 % (11/21 0630) FiO2 (%):  [40 %] 40 % (11/21 0600) Weight:  [108.6 kg] 108.6 kg (11/21 0500) Last BM Date :  (pta)  Intake/Output from previous day: 11/20 0701 - 11/21 0700 In: 4756.4 [I.V.:2804.1; NG/GT:951.8; IV Piggyback:1000.5] Out: 2760 [Urine:2700; Emesis/NG output:60] Intake/Output this shift: No intake/output data recorded.  PE: General: WD, obese male who is laying in bed intubated and sedated Neck: collar present  Heart: regular, rate, and rhythm.   Lungs: vent  Abd: soft, NT, ND, +BS Skin: warm and dry with no masses, lesions, or rashes   Lab Results:  Recent Labs    02/02/22 0410 02/02/22 0417 02/02/22 0447 02/02/22 0731  WBC 16.7*  --   --   --   HGB 8.6*   < > 8.8* 9.2*  HCT 30.8*   < > 26.0* 27.0*  PLT 344  --   --   --    < > = values in this interval not displayed.   BMET Recent Labs    02/02/22 0410 02/02/22 0417 02/02/22 0447 02/02/22 0731 02/03/22 0617  NA 145 145   < > 144 142  K 4.8 4.7   < > 4.1 3.3*  CL 106 107  --   --  107  CO2 26  --   --   --  24  GLUCOSE 166* 166*  --   --  110*  BUN 26* 28*  --   --  22  CREATININE 1.32* 1.30*  --   --  0.81  CALCIUM 8.5*  --   --   --  8.6*   < > = values in this interval not displayed.   PT/INR Recent Labs    02/02/22 0410 02/03/22 0617  LABPROT 21.2* 24.6*  INR 1.9* 2.2*   CMP     Component Value Date/Time   NA 142 02/03/2022 0617   K 3.3 (L) 02/03/2022 0617   CL 107 02/03/2022 0617   CO2 24 02/03/2022 0617   GLUCOSE 110 (H) 02/03/2022 0617   BUN 22 02/03/2022 0617   CREATININE 0.81 02/03/2022 0617   CALCIUM 8.6 (L) 02/03/2022 0617    PROT 6.4 (L) 02/03/2022 0617   ALBUMIN 2.9 (L) 02/03/2022 0617   AST 19 02/03/2022 0617   ALT 24 02/03/2022 0617   ALKPHOS 60 02/03/2022 0617   BILITOT 0.4 02/03/2022 0617   GFRNONAA >60 02/03/2022 0617   Lipase  No results found for: "LIPASE"     Studies/Results: CT ANGIO HEAD NECK W WO CM  Addendum Date: 02/03/2022   ADDENDUM REPORT: 02/03/2022 05:50 ADDENDUM: #4 was Study discussed by telephone with ICU Nurse Brittney at 0528 hours on 02/03/2022. Electronically Signed   By: Odessa Fleming M.D.   On: 02/03/2022 05:50   Result Date: 02/03/2022 CLINICAL DATA:  66 year old male status post fall, seizure. Intubated. Unrevealing brain MRI. EXAM: CT ANGIOGRAPHY HEAD AND NECK TECHNIQUE: Multidetector CT imaging of the head and neck was performed using the standard protocol during bolus administration of intravenous contrast. Multiplanar CT image reconstructions and MIPs were obtained to evaluate the vascular anatomy. Carotid stenosis measurements (when applicable)  are obtained utilizing NASCET criteria, using the distal internal carotid diameter as the denominator. RADIATION DOSE REDUCTION: This exam was performed according to the departmental dose-optimization program which includes automated exposure control, adjustment of the mA and/or kV according to patient size and/or use of iterative reconstruction technique. CONTRAST:  75mL OMNIPAQUE IOHEXOL 350 MG/ML SOLN COMPARISON:  02/02/2022 brain MRI, head and cervical spine CT, CT Chest, Abdomen, and Pelvis. FINDINGS: CT HEAD Brain: Mild EEG lead artifact now. Stable non contrast CT appearance of the brain. No acute or evolving infarct identified. No acute intracranial hemorrhage identified. No intracranial mass effect. Calvarium and skull base: Stable and intact. Paranasal sinuses: Remains intubated. Scattered paranasal sinus fluid and mucosal thickening is stable. Tympanic cavities and mastoids remain clear. Orbits: No acute orbit or scalp soft tissue  finding. EEG leads are in place. CTA NECK Skeleton: Carious dentition. Spine degeneration. Previous sternotomy. No acute osseous abnormality identified. Upper chest: Prior CABG. Endotracheal tube terminates at the carina. Enteric tube courses into the esophagus. Visible mediastinum appears stable from a chest CTA 07/13/2020, with occasional maximal nonspecific mediastinal lymph nodes (right paratracheal series 8, image 164). Upper lungs remain clear. Other neck: Intubated and oral enteric tube in place with fluid in the pharynx. No superimposed acute neck soft tissue finding identified. Aortic arch: Prior CABG. Calcified aortic atherosclerosis. 3 vessel arch configuration. Right carotid system: Brachiocephalic soft and calcified plaque without stenosis. Negative right CCA origin. No right CCA plaque before the bifurcation. Soft more than calcified plaque at the right ICA origin with less than 50 % stenosis with respect to the distal vessel. Left carotid system: Mild left CCA origin plaque without stenosis. Calcified plaque at the left carotid bifurcation and ICA origin with less than 50 % stenosis with respect to the distal vessel. Vertebral arteries: Proximal right subclavian artery soft and calcified plaque without stenosis. Normal right vertebral artery origin. Right vertebral artery appears codominant and normal to the skull base. Proximal left subclavian artery soft and calcified plaque with no significant stenosis. Plaque abuts the left vertebral artery origin with only mild origin stenosis on series 14, image 125. Superimposed left V1 calcified plaque on series 13, image 298 with only mild stenosis. Otherwise codominant and normal left vertebral artery to the skull base. CTA HEAD Posterior circulation: Mild left vertebral V4 calcified plaque without stenosis. Left PICA origin remains patent. Bulky right V4 calcified plaque and moderate to severe right V4 stenosis (series 13, image 155 and series 14, image  114) but the distal right vertebral remains patent. Patent vertebrobasilar junction without stenosis. Right AICA appears dominant and patent. Patent mildly tortuous basilar artery without stenosis. Normal SCA and PCA origins. Small posterior communicating arteries. Bilateral PCA branches are within normal limits. Anterior circulation: Both ICA siphons are patent. Tortuous left siphon cavernous segment with up to moderate calcified plaque and mild stenosis. Similar right siphon cavernous segment tortuosity and moderate calcified plaque but only mild right siphon stenosis. Patent carotid termini. Patent MCA and ACA origins. Dominant left A1, the right A1 is diminutive. Anterior communicating artery and bilateral ACA branches appear patent without stenosis. Ectatic bilateral ACA/pericallosal vessel branching (series 18, image 20), but no discrete ACA aneurysm. Left MCA M1 segment and bifurcation are patent without stenosis. Right MCA M1 segment and bifurcation are patent without stenosis. Bilateral MCA branches are within normal limits. Venous sinuses: Patent. Anatomic variants: Dominant left and diminutive right ACA A1 segments. Review of the MIP images confirms the above findings IMPRESSION: 1. Negative  for large vessel occlusion. Positive for bulky calcified plaque at the Right Vertebral Artery V4 segment with moderate to severe stenosis. But the vessel remains patent. 2. No other significant arterial stenosis in the head or neck. Bilateral carotid bifurcation, bilateral cavernous ICA, and left vertebral artery origin plaque with only mild stenosis. 3. Stable CT appearance of the brain. No acute intracranial abnormality identified. 4. Endotracheal tube terminates at the carina, retract 1 cm for more optimal placement. Enteric tube courses into the esophagus. 5. Prior CABG. Aortic Atherosclerosis (ICD10-I70.0). Carious dentition. Electronically Signed: By: Odessa Fleming M.D. On: 02/03/2022 05:14   MR BRAIN W WO  CONTRAST  Result Date: 02/02/2022 CLINICAL DATA:  Seizure, new-onset, history of trauma EXAM: MRI HEAD WITHOUT AND WITH CONTRAST TECHNIQUE: Multiplanar, multiecho pulse sequences of the brain and surrounding structures were obtained without and with intravenous contrast. CONTRAST:  10mL GADAVIST GADOBUTROL 1 MMOL/ML IV SOLN COMPARISON:  CT head from the same day.  MRI head January 28, 2021. FINDINGS: Brain: No acute infarction, hemorrhage, hydrocephalus, extra-axial collection or mass lesion. Hippocampi appear grossly within normal limits and symmetric, although evaluation is limited due to motion on the coronal sequences. No pathologic enhancement. Vascular: Major arterial flow voids are maintained at the skull base. Skull and upper cervical spine: Normal marrow signal. Sinuses/Orbits: Paranasal sinus mucosal thickening with air-fluid levels. No acute orbital findings. Other: No mastoid effusions. IMPRESSION: No evidence of acute intracranial abnormality. Electronically Signed   By: Feliberto Harts M.D.   On: 02/02/2022 15:21   EEG adult  Result Date: 02/02/2022 Charlsie Quest, MD     02/02/2022  1:46 PM Patient Name: Jose [name] MRN: [id number] Epilepsy Attending: Charlsie Quest Referring Physician/Provider: Gleason, Darcella Gasman, PA-C Date: 02/02/2022 Duration: 23.36 mins Patient history: 66 year old male with altered mental status.  EEG to evaluate for seizure. Level of alertness: comatose AEDs during EEG study: GBP, Propofol Technical aspects: This EEG study was done with scalp electrodes positioned according to the 10-20 International system of electrode placement. Electrical activity was reviewed with band pass filter of 1-70Hz , sensitivity of 7 uV/mm, display speed of 6mm/sec with a  notched filter applied as appropriate. EEG data were recorded continuously and digitally stored.  Video monitoring was available and reviewed as appropriate. Description: EEG showed continuous generalized 3 to 6  Hz theta-delta slowing admixed with 15 to 18 Hz beta activity distributed symmetrically and diffusely. Hyperventilation and photic stimulation were not performed.   ABNORMALITY - Continuous slow, generalized IMPRESSION: This study is suggestive of severe diffuse encephalopathy, nonspecific etiology. No seizures or epileptiform discharges were seen throughout the recording. Charlsie Quest   DG Abd Portable 1V  Result Date: 02/02/2022 CLINICAL DATA:  Orogastric tube placement. EXAM: PORTABLE ABDOMEN - 1 VIEW COMPARISON:  None Available. FINDINGS: Distal tip of nasogastric tube is seen in expected position of distal stomach. IMPRESSION: Distal tip of nasogastric tube is seen in expected position of distal stomach. Electronically Signed   By: Lupita Raider M.D.   On: 02/02/2022 09:36   CT HEAD WO CONTRAST  Result Date: 02/02/2022 CLINICAL DATA:  Fall injury.  Level 1 trauma. EXAM: CT HEAD WITHOUT CONTRAST CT CERVICAL SPINE WITHOUT CONTRAST CT CHEST, ABDOMEN AND PELVIS WITH CONTRAST TECHNIQUE: Contiguous axial images were obtained from the base of the skull through the vertex without intravenous contrast. Multidetector CT imaging of the cervical spine was performed without intravenous contrast. Multiplanar CT image reconstructions were also generated. Multidetector CT imaging of the chest,  abdomen and pelvis was performed following the standard protocol during bolus administration of intravenous contrast. RADIATION DOSE REDUCTION: This exam was performed according to the departmental dose-optimization program which includes automated exposure control, adjustment of the mA and/or kV according to patient size and/or use of iterative reconstruction technique. CONTRAST:  75mL OMNIPAQUE IOHEXOL 350 MG/ML SOLN COMPARISON:  Head CT and cervical spine CT both 07/07/2021, CTA of abdomen and pelvis recently 01/22/2022, and CTA chest 07/13/2020. FINDINGS: CT HEAD FINDINGS Brain: There is mild global atrophy, mild  small-vessel disease of the cerebral white matter. The ventricles are normal in size and position. No new asymmetry is seen worrisome for an acute hemorrhage, cortical based acute infarct, or mass. Vascular: There calcifications in the distal vertebral arteries and siphons but no hyperdense central vessel. Skull: Negative for fractures or focal lesions. I see no focal scalp hematoma. Sinuses/Orbits: Prominent bilateral proptosis with proliferated intraorbital fat, but without extraocular muscle thickening. This was seen previously. There is partial opacification of the ethmoid air cells. Trace membrane thickening in the maxillary sinuses. Other visualized sinuses, bilateral mastoid air cells and middle ears are clear. Other: None. CT CERVICAL FINDINGS Alignment: Normal. Skull base and vertebrae: The vertebra are normally mineralized. No fracture is evident. Joint space loss and spurring is again noted of the anterior atlantodental joint with the C1 lateral masses normally positioned on C2. Soft tissues and spinal canal: No prevertebral fluid or swelling. No visible canal hematoma. There calcifications in the left-greater-than-right proximal cervical ICAs. Disc levels: There is mild disc space loss at C4-5 and moderate to advanced disc space loss C5-6 and C6-7, mild-to-moderate disc space loss C7-T1, unchanged. There are anterior osteophytes without significant posterior osteophytes at C2-3, C3-4 and C4-5. At C5-6 and C6-7 there are bidirectional osteophytes posterior disc osteophyte complexes encroaching on the ventral cord surface to left-greater-than-right. Other levels do not show significant soft tissue or bony encroachment on the thecal sac. Facet joint and uncinate hypertrophy is seen at most levels. Acquired foraminal stenosis is mild on the right at C2-3, moderate on the left at C3-4, bilaterally moderate to severe C5-6, and moderate to severe on the left at C6-7. Other:  None. CT CHEST FINDINGS  Cardiovascular: There is mild cardiomegaly, CABG change and three-vessel calcific CAD. There is no pericardial effusion. The pulmonary arteries and veins are normal caliber. The pulmonary arteries are centrally clear. The aorta is normal caliber without stenosis, aneurysm or dissection with mild patchy calcific plaques, and moderate calcification in the aortic valve leaflets. There are scattered calcifications in the great vessels without stenosis. Mediastinum/Nodes: No enlarged mediastinal, hilar, or axillary lymph nodes. Thyroid gland and thoracic esophagus demonstrate no significant findings. There are retained secretions in the distal trachea and ETT in place with tip 2.5 cm from the carina. The main bronchi are clear. Lungs/Pleura: Again noted scattered linear scarring or atelectasis in the lung bases, predominantly in the lower lobes. No infiltrate, contusion or nodule are seen. No pleural effusion, thickening or pneumothorax. Musculoskeletal: There are degenerative changes of the spine as well as multilevel bridging enthesopathy. There is no spinal compression injury. No acute regional skeletal abnormality is visible. The ribcage is intact. CT ABDOMEN PELVIS FINDINGS Hepatobiliary: No hepatic injury or perihepatic hematoma. Gallbladder is unremarkable the liver is 20 cm length with mild general steatosis without mass. There is no biliary dilatation. Pancreas: Partially atrophic.  Otherwise unremarkable. Spleen: No splenic injury or perisplenic hematoma. Adrenals/Urinary Tract: There is no adrenal mass or hemorrhage. There is  no renal mass enhancement or hemorrhage. Bilateral nonobstructive nephrolithiasis. Largest stone is 1 cm on the right. Perinephric stranding is unchanged. There is no hydronephrosis, ureteral stone or bladder thickening. Stomach/Bowel: The stomach and unopacified small bowel are unremarkable. An appendix is not seen. There is mild wall thickening versus underdistention in the ascending  colon. There is mild fecal stasis and colonic interposition of the hepatic flexure in front of the liver. Scattered sigmoid diverticula without evidence of diverticulitis. Vascular/Lymphatic: Aortic atherosclerosis. No enlarged abdominal or pelvic lymph nodes. Stable asymmetry of ectasia in the left common iliac artery which measures 1.7 cm. Reproductive: No prostatomegaly. Other: Right gluteal subcutaneous hematoma measuring 9.3 x 3.8 cm on 4:98, at a similar level previously having measured 9.1 x 3.6 cm therefore only slightly larger. No active hemorrhaging vessel is seen within the collection. Stranding in the bilateral lateral upper thighs is again also noted greater on the right. There is no free fluid, free air, free hemorrhage or incarcerated hernia. Musculoskeletal: There is a mild superior endplate anterior wedge compression fracture of the L1 vertebral body, but this was seen previously. There is mild osteopenia and advanced degenerative change of the lumbar spine. No acute regional osseous abnormality is seen. Ankylosis across the anterior right SI joint. Multilevel lumbar foraminal stenosis. IMPRESSION: 1. No acute intracranial CT findings or depressed skull fractures. Atrophy and small-vessel disease. 2. Bilateral proptosis with proliferated intraorbital fat, but without extraocular muscle thickening. This was seen previously and could be due to obesity, chronic corticosteroid use, thyroid ophthalmopathy or Graves' disease. 3. Cervical degenerative changes without evidence of fractures or listhesis. 4. 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 Aortic and coronary artery atherosclerosis. No findings of acute CHF. 5. Intubated, with retained secretions in the distal trachea. 6. No acute trauma related findings in the chest, abdomen or pelvis. 7. Right gluteal subcutaneous hematoma measuring 9.3 x 3.8 cm, only slightly larger than on 06/22/2021. No active hemorrhaging vessel is seen within the  hematoma. 8. Ascending colitis versus nondistention. Constipation and diverticulosis. 9. Nonobstructive nephrolithiasis. 10. Osteopenia and degenerative change without acute regional skeletal abnormality. 11. Results were called to Dr. Stechsehulte at 4:39 a.m., 02/02/2022 immediately after review of available images. Aortic Atherosclerosis (ICD10-I70.0). Electronically Signed   By: Keith  Chesser M.D.   On: 02/02/2022 05:37   CT CERVICAL SPINE WO CONTRAST  Result Date: 02/02/2022 CLINICAL DATA:  Fall injury.  Level 1 trauma. EXAM: CT HEAD WITHOUT CONTRAST CT CERVICAL SPINE WITHOUT CONTRAST CT CHEST, ABDOMEN AND PELVIS WITH CONTRAST TECHNIQUE: Contiguous axial images were obtained from the base of the skull through the vertex without intravenous contrast. Multidetector CT imaging of the cervical spine was performed without intravenous contrast. Multiplanar CT image reconstructions were also generated. Multidetector CT imaging of the chest, abdomen and pelvis was performed following the standard protocol during bolus administration of intravenous contrast. RADIATION DOSE REDUCTION: This exam was performed according to the departmental dose-optimization program which includes automated exposure control, adjustment of the mA and/or kV according to patient size and/or use of iterative reconstruction technique. CONTRAST:  56mL OMNIPAQUE IOHEXOL 350 MG/ML SOLN COMPARISON:  Head CT and cervical spine CT both 07/07/2021, CTA of abdomen and pelvis recently 01/22/2022, and CTA chest 07/13/2020. FINDINGS: CT HEAD FINDINGS Brain: There is mild global atrophy, mild small-vessel disease of the cerebral white matter. The ventricles are normal in size and position. No new asymmetry is seen worrisome for an acute hemorrhage, cortical based acute infarct, or mass. Vascular: There calcifications in  the distal vertebral arteries and siphons but no hyperdense central vessel. Skull: Negative for fractures or focal lesions. I see no  focal scalp hematoma. Sinuses/Orbits: Prominent bilateral proptosis with proliferated intraorbital fat, but without extraocular muscle thickening. This was seen previously. There is partial opacification of the ethmoid air cells. Trace membrane thickening in the maxillary sinuses. Other visualized sinuses, bilateral mastoid air cells and middle ears are clear. Other: None. CT CERVICAL FINDINGS Alignment: Normal. Skull base and vertebrae: The vertebra are normally mineralized. No fracture is evident. Joint space loss and spurring is again noted of the anterior atlantodental joint with the C1 lateral masses normally positioned on C2. Soft tissues and spinal canal: No prevertebral fluid or swelling. No visible canal hematoma. There calcifications in the left-greater-than-right proximal cervical ICAs. Disc levels: There is mild disc space loss at C4-5 and moderate to advanced disc space loss C5-6 and C6-7, mild-to-moderate disc space loss C7-T1, unchanged. There are anterior osteophytes without significant posterior osteophytes at C2-3, C3-4 and C4-5. At C5-6 and C6-7 there are bidirectional osteophytes posterior disc osteophyte complexes encroaching on the ventral cord surface to left-greater-than-right. Other levels do not show significant soft tissue or bony encroachment on the thecal sac. Facet joint and uncinate hypertrophy is seen at most levels. Acquired foraminal stenosis is mild on the right at C2-3, moderate on the left at C3-4, bilaterally moderate to severe C5-6, and moderate to severe on the left at C6-7. Other:  None. CT CHEST FINDINGS Cardiovascular: There is mild cardiomegaly, CABG change and three-vessel calcific CAD. There is no pericardial effusion. The pulmonary arteries and veins are normal caliber. The pulmonary arteries are centrally clear. The aorta is normal caliber without stenosis, aneurysm or dissection with mild patchy calcific plaques, and moderate calcification in the aortic valve  leaflets. There are scattered calcifications in the great vessels without stenosis. Mediastinum/Nodes: No enlarged mediastinal, hilar, or axillary lymph nodes. Thyroid gland and thoracic esophagus demonstrate no significant findings. There are retained secretions in the distal trachea and ETT in place with tip 2.5 cm from the carina. The main bronchi are clear. Lungs/Pleura: Again noted scattered linear scarring or atelectasis in the lung bases, predominantly in the lower lobes. No infiltrate, contusion or nodule are seen. No pleural effusion, thickening or pneumothorax. Musculoskeletal: There are degenerative changes of the spine as well as multilevel bridging enthesopathy. There is no spinal compression injury. No acute regional skeletal abnormality is visible. The ribcage is intact. CT ABDOMEN PELVIS FINDINGS Hepatobiliary: No hepatic injury or perihepatic hematoma. Gallbladder is unremarkable the liver is 20 cm length with mild general steatosis without mass. There is no biliary dilatation. Pancreas: Partially atrophic.  Otherwise unremarkable. Spleen: No splenic injury or perisplenic hematoma. Adrenals/Urinary Tract: There is no adrenal mass or hemorrhage. There is no renal mass enhancement or hemorrhage. Bilateral nonobstructive nephrolithiasis. Largest stone is 1 cm on the right. Perinephric stranding is unchanged. There is no hydronephrosis, ureteral stone or bladder thickening. Stomach/Bowel: The stomach and unopacified small bowel are unremarkable. An appendix is not seen. There is mild wall thickening versus underdistention in the ascending colon. There is mild fecal stasis and colonic interposition of the hepatic flexure in front of the liver. Scattered sigmoid diverticula without evidence of diverticulitis. Vascular/Lymphatic: Aortic atherosclerosis. No enlarged abdominal or pelvic lymph nodes. Stable asymmetry of ectasia in the left common iliac artery which measures 1.7 cm. Reproductive: No  prostatomegaly. Other: Right gluteal subcutaneous hematoma measuring 9.3 x 3.8 cm on 4:98, at a similar level previously  having measured 9.1 x 3.6 cm therefore only slightly larger. No active hemorrhaging vessel is seen within the collection. Stranding in the bilateral lateral upper thighs is again also noted greater on the right. There is no free fluid, free air, free hemorrhage or incarcerated hernia. Musculoskeletal: There is a mild superior endplate anterior wedge compression fracture of the L1 vertebral body, but this was seen previously. There is mild osteopenia and advanced degenerative change of the lumbar spine. No acute regional osseous abnormality is seen. Ankylosis across the anterior right SI joint. Multilevel lumbar foraminal stenosis. IMPRESSION: 1. No acute intracranial CT findings or depressed skull fractures. Atrophy and small-vessel disease. 2. Bilateral proptosis with proliferated intraorbital fat, but without extraocular muscle thickening. This was seen previously and could be due to obesity, chronic corticosteroid use, thyroid ophthalmopathy or Graves' disease. 3. Cervical degenerative changes without evidence of fractures or listhesis. 4. 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 Aortic and coronary artery atherosclerosis. No findings of acute CHF. 5. Intubated, with retained secretions in the distal trachea. 6. No acute trauma related findings in the chest, abdomen or pelvis. 7. Right gluteal subcutaneous hematoma measuring 9.3 x 3.8 cm, only slightly larger than on 06/22/2021. No active hemorrhaging vessel is seen within the hematoma. 8. Ascending colitis versus nondistention. Constipation and diverticulosis. 9. Nonobstructive nephrolithiasis. 10. Osteopenia and degenerative change without acute regional skeletal abnormality. 11. Results were called to Dr. Junius Finner at 4:39 a.m., 02/02/2022 immediately after review of available images. Aortic Atherosclerosis (ICD10-I70.0).  Electronically Signed   By: Almira Bar M.D.   On: 02/02/2022 05:37   CT CHEST ABDOMEN PELVIS W CONTRAST  Result Date: 02/02/2022 CLINICAL DATA:  Fall injury.  Level 1 trauma. EXAM: CT HEAD WITHOUT CONTRAST CT CERVICAL SPINE WITHOUT CONTRAST CT CHEST, ABDOMEN AND PELVIS WITH CONTRAST TECHNIQUE: Contiguous axial images were obtained from the base of the skull through the vertex without intravenous contrast. Multidetector CT imaging of the cervical spine was performed without intravenous contrast. Multiplanar CT image reconstructions were also generated. Multidetector CT imaging of the chest, abdomen and pelvis was performed following the standard protocol during bolus administration of intravenous contrast. RADIATION DOSE REDUCTION: This exam was performed according to the departmental dose-optimization program which includes automated exposure control, adjustment of the mA and/or kV according to patient size and/or use of iterative reconstruction technique. CONTRAST:  67mL OMNIPAQUE IOHEXOL 350 MG/ML SOLN COMPARISON:  Head CT and cervical spine CT both 07/07/2021, CTA of abdomen and pelvis recently 01/22/2022, and CTA chest 07/13/2020. FINDINGS: CT HEAD FINDINGS Brain: There is mild global atrophy, mild small-vessel disease of the cerebral white matter. The ventricles are normal in size and position. No new asymmetry is seen worrisome for an acute hemorrhage, cortical based acute infarct, or mass. Vascular: There calcifications in the distal vertebral arteries and siphons but no hyperdense central vessel. Skull: Negative for fractures or focal lesions. I see no focal scalp hematoma. Sinuses/Orbits: Prominent bilateral proptosis with proliferated intraorbital fat, but without extraocular muscle thickening. This was seen previously. There is partial opacification of the ethmoid air cells. Trace membrane thickening in the maxillary sinuses. Other visualized sinuses, bilateral mastoid air cells and middle ears  are clear. Other: None. CT CERVICAL FINDINGS Alignment: Normal. Skull base and vertebrae: The vertebra are normally mineralized. No fracture is evident. Joint space loss and spurring is again noted of the anterior atlantodental joint with the C1 lateral masses normally positioned on C2. Soft tissues and spinal canal: No prevertebral fluid or swelling. No  visible canal hematoma. There calcifications in the left-greater-than-right proximal cervical ICAs. Disc levels: There is mild disc space loss at C4-5 and moderate to advanced disc space loss C5-6 and C6-7, mild-to-moderate disc space loss C7-T1, unchanged. There are anterior osteophytes without significant posterior osteophytes at C2-3, C3-4 and C4-5. At C5-6 and C6-7 there are bidirectional osteophytes posterior disc osteophyte complexes encroaching on the ventral cord surface to left-greater-than-right. Other levels do not show significant soft tissue or bony encroachment on the thecal sac. Facet joint and uncinate hypertrophy is seen at most levels. Acquired foraminal stenosis is mild on the right at C2-3, moderate on the left at C3-4, bilaterally moderate to severe C5-6, and moderate to severe on the left at C6-7. Other:  None. CT CHEST FINDINGS Cardiovascular: There is mild cardiomegaly, CABG change and three-vessel calcific CAD. There is no pericardial effusion. The pulmonary arteries and veins are normal caliber. The pulmonary arteries are centrally clear. The aorta is normal caliber without stenosis, aneurysm or dissection with mild patchy calcific plaques, and moderate calcification in the aortic valve leaflets. There are scattered calcifications in the great vessels without stenosis. Mediastinum/Nodes: No enlarged mediastinal, hilar, or axillary lymph nodes. Thyroid gland and thoracic esophagus demonstrate no significant findings. There are retained secretions in the distal trachea and ETT in place with tip 2.5 cm from the carina. The main bronchi are  clear. Lungs/Pleura: Again noted scattered linear scarring or atelectasis in the lung bases, predominantly in the lower lobes. No infiltrate, contusion or nodule are seen. No pleural effusion, thickening or pneumothorax. Musculoskeletal: There are degenerative changes of the spine as well as multilevel bridging enthesopathy. There is no spinal compression injury. No acute regional skeletal abnormality is visible. The ribcage is intact. CT ABDOMEN PELVIS FINDINGS Hepatobiliary: No hepatic injury or perihepatic hematoma. Gallbladder is unremarkable the liver is 20 cm length with mild general steatosis without mass. There is no biliary dilatation. Pancreas: Partially atrophic.  Otherwise unremarkable. Spleen: No splenic injury or perisplenic hematoma. Adrenals/Urinary Tract: There is no adrenal mass or hemorrhage. There is no renal mass enhancement or hemorrhage. Bilateral nonobstructive nephrolithiasis. Largest stone is 1 cm on the right. Perinephric stranding is unchanged. There is no hydronephrosis, ureteral stone or bladder thickening. Stomach/Bowel: The stomach and unopacified small bowel are unremarkable. An appendix is not seen. There is mild wall thickening versus underdistention in the ascending colon. There is mild fecal stasis and colonic interposition of the hepatic flexure in front of the liver. Scattered sigmoid diverticula without evidence of diverticulitis. Vascular/Lymphatic: Aortic atherosclerosis. No enlarged abdominal or pelvic lymph nodes. Stable asymmetry of ectasia in the left common iliac artery which measures 1.7 cm. Reproductive: No prostatomegaly. Other: Right gluteal subcutaneous hematoma measuring 9.3 x 3.8 cm on 4:98, at a similar level previously having measured 9.1 x 3.6 cm therefore only slightly larger. No active hemorrhaging vessel is seen within the collection. Stranding in the bilateral lateral upper thighs is again also noted greater on the right. There is no free fluid, free air,  free hemorrhage or incarcerated hernia. Musculoskeletal: There is a mild superior endplate anterior wedge compression fracture of the L1 vertebral body, but this was seen previously. There is mild osteopenia and advanced degenerative change of the lumbar spine. No acute regional osseous abnormality is seen. Ankylosis across the anterior right SI joint. Multilevel lumbar foraminal stenosis. IMPRESSION: 1. No acute intracranial CT findings or depressed skull fractures. Atrophy and small-vessel disease. 2. Bilateral proptosis with proliferated intraorbital fat, but without extraocular muscle  thickening. This was seen previously and could be due to obesity, chronic corticosteroid use, thyroid ophthalmopathy or Graves' disease. 3. Cervical degenerative changes without evidence of fractures or listhesis. 4. 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 Aortic and coronary artery atherosclerosis. No findings of acute CHF. 5. Intubated, with retained secretions in the distal trachea. 6. No acute trauma related findings in the chest, abdomen or pelvis. 7. Right gluteal subcutaneous hematoma measuring 9.3 x 3.8 cm, only slightly larger than on 06/22/2021. No active hemorrhaging vessel is seen within the hematoma. 8. Ascending colitis versus nondistention. Constipation and diverticulosis. 9. Nonobstructive nephrolithiasis. 10. Osteopenia and degenerative change without acute regional skeletal abnormality. 11. Results were called to Dr. Junius Finner at 4:39 a.m., 02/02/2022 immediately after review of available images. Aortic Atherosclerosis (ICD10-I70.0). Electronically Signed   By: Almira Bar M.D.   On: 02/02/2022 05:37   DG Pelvis Portable  Result Date: 02/02/2022 CLINICAL DATA:  Trauma. EXAM: PORTABLE PELVIS 1-2 VIEWS COMPARISON:  01/22/2022 FINDINGS: There is no evidence of pelvic fracture or diastasis. No pelvic bone lesions are seen. Degenerative changes noted within both hips and lumbar spine. IMPRESSION: 1.  No acute findings. 2. Degenerative joint disease. Electronically Signed   By: Signa Kell M.D.   On: 02/02/2022 05:04   DG Chest Port 1 View  Result Date: 02/02/2022 CLINICAL DATA:  Fall injury. Level 1 trauma. Ventilator dependent respiratory failure. EXAM: PORTABLE CHEST 1 VIEW COMPARISON:  Portable chest 01/22/2023 a. FINDINGS: The lungs are clear. No pneumothorax or pleural effusion are seen. There is mild cardiomegaly with old CABG. No vascular congestion is evident. ETT is in place with tip 3 cm from carina. Multiple overlying monitor wires. No acute osseous abnormality is visible. IMPRESSION: No acute radiographic chest findings. Mild cardiomegaly with old CABG. ETT in place. Electronically Signed   By: Almira Bar M.D.   On: 02/02/2022 04:55    Anti-infectives: Anti-infectives (From admission, onward)    Start     Dose/Rate Route Frequency Ordered Stop   02/03/22 0830  piperacillin-tazobactam (ZOSYN) IVPB 3.375 g        3.375 g 12.5 mL/hr over 240 Minutes Intravenous Every 8 hours 02/03/22 0731          Assessment/Plan Frequent falls/?syncope Right buttock hematoma - noted recently at [facility], no significant change on CT, no extrav - no further recs from a trauma standpoint, if hypotensive check hgb and consider reversal of warfarin  C-spine - no acute trauma on imaging, ok to remove collar, if pain once awake replace collar and get MRI  Trauma will sign off at this time, please call if we can be of further assistance.   LOS: 1 day      Juliet Rude, [facility] 02/03/2022, 7:39 AM Please see Amion for pager number during day hours 7:00am-4:30pm

## 2022-02-04 DIAGNOSIS — R4189 Other symptoms and signs involving cognitive functions and awareness: Secondary | ICD-10-CM | POA: Diagnosis not present

## 2022-02-04 DIAGNOSIS — J441 Chronic obstructive pulmonary disease with (acute) exacerbation: Secondary | ICD-10-CM

## 2022-02-04 LAB — PROTIME-INR
INR: 2.5 — ABNORMAL HIGH (ref 0.8–1.2)
Prothrombin Time: 27.1 seconds — ABNORMAL HIGH (ref 11.4–15.2)

## 2022-02-04 LAB — BASIC METABOLIC PANEL
Anion gap: 12 (ref 5–15)
BUN: 20 mg/dL (ref 8–23)
CO2: 23 mmol/L (ref 22–32)
Calcium: 8.6 mg/dL — ABNORMAL LOW (ref 8.9–10.3)
Chloride: 108 mmol/L (ref 98–111)
Creatinine, Ser: 0.84 mg/dL (ref 0.61–1.24)
GFR, Estimated: >60 mL/min (ref >60)
Glucose, Bld: 137 mg/dL — ABNORMAL HIGH (ref 70–99)
Potassium: 3.8 mmol/L (ref 3.5–5.1)
Sodium: 143 mmol/L (ref 135–145)

## 2022-02-04 LAB — GLUCOSE, CAPILLARY
Glucose-Capillary: 112 mg/dL — ABNORMAL HIGH (ref 70–99)
Glucose-Capillary: 133 mg/dL — ABNORMAL HIGH (ref 70–99)
Glucose-Capillary: 188 mg/dL — ABNORMAL HIGH (ref 70–99)
Glucose-Capillary: 264 mg/dL — ABNORMAL HIGH (ref 70–99)
Glucose-Capillary: 160 mg/dL — ABNORMAL HIGH (ref 70–99)
Glucose-Capillary: 145 mg/dL — ABNORMAL HIGH (ref 70–99)

## 2022-02-04 LAB — CBC
HCT: 25.5 % — ABNORMAL LOW (ref 39.0–52.0)
Hemoglobin: 7.9 g/dL — ABNORMAL LOW (ref 13.0–17.0)
MCH: 29.3 pg (ref 26.0–34.0)
MCHC: 31 g/dL (ref 30.0–36.0)
MCV: 94.4 fL (ref 80.0–100.0)
Platelets: 323 10*3/uL (ref 150–400)
RBC: 2.7 MIL/uL — ABNORMAL LOW (ref 4.22–5.81)
RDW: 17.3 % — ABNORMAL HIGH (ref 11.5–15.5)
WBC: 15.3 10*3/uL — ABNORMAL HIGH (ref 4.0–10.5)
nRBC: 0.3 % — ABNORMAL HIGH (ref 0.0–0.2)

## 2022-02-04 LAB — TRIGLYCERIDES: Triglycerides: 133 mg/dL (ref <150)

## 2022-02-04 MED ORDER — LINEZOLID 600 MG/300ML IV SOLN
600.0000 mg | Freq: Two times a day (BID) | INTRAVENOUS | Status: DC
  Administered 2022-02-04 – 2022-02-06 (×5): 600 mg via INTRAVENOUS
  Filled 2022-02-04 (×7): qty 300

## 2022-02-04 MED ORDER — POLYETHYLENE GLYCOL 3350 17 G PO PACK
17.0000 g | PACK | Freq: Two times a day (BID) | ORAL | Status: DC
  Administered 2022-02-04: 17 g
  Filled 2022-02-04: qty 1

## 2022-02-04 MED ORDER — POTASSIUM CHLORIDE 10 MEQ/100ML IV SOLN
10.0000 meq | INTRAVENOUS | Status: AC
  Administered 2022-02-04 – 2022-02-05 (×4): 10 meq via INTRAVENOUS
  Filled 2022-02-04 (×4): qty 100

## 2022-02-04 MED ORDER — PREDNISONE 5 MG PO TABS: 30.0000 mg | ORAL_TABLET | Freq: Every day | ORAL | Status: DC

## 2022-02-04 MED ORDER — MAGNESIUM SULFATE 2 GM/50ML IV SOLN
2.0000 g | Freq: Once | INTRAVENOUS | Status: AC
  Administered 2022-02-04: 2 g via INTRAVENOUS
  Filled 2022-02-04: qty 50

## 2022-02-04 MED ORDER — DEXMEDETOMIDINE HCL IN NACL 400 MCG/100ML IV SOLN: 0.0000 ug/kg/h | INTRAVENOUS | Status: DC

## 2022-02-04 MED ORDER — WARFARIN SODIUM 2.5 MG PO TABS
2.5000 mg | ORAL_TABLET | Freq: Once | ORAL | Status: DC
  Filled 2022-02-04: qty 1

## 2022-02-04 MED ORDER — PREDNISONE 20 MG PO TABS: 20.0000 mg | ORAL_TABLET | Freq: Every day | ORAL | Status: DC

## 2022-02-04 MED ORDER — ORAL CARE MOUTH RINSE: 15.0000 mL | OROMUCOSAL | Status: DC | PRN

## 2022-02-04 MED ORDER — FUROSEMIDE 10 MG/ML IJ SOLN
60.0000 mg | Freq: Four times a day (QID) | INTRAMUSCULAR | Status: AC
  Administered 2022-02-04 (×3): 60 mg via INTRAVENOUS
  Filled 2022-02-04 (×3): qty 6

## 2022-02-04 MED ORDER — INSULIN ASPART 100 UNIT/ML IJ SOLN
2.0000 [IU] | INTRAMUSCULAR | Status: DC
  Administered 2022-02-04 (×2): 2 [IU] via SUBCUTANEOUS

## 2022-02-04 MED ORDER — FENTANYL CITRATE PF 50 MCG/ML IJ SOSY: 25.0000 ug | PREFILLED_SYRINGE | INTRAMUSCULAR | Status: DC | PRN

## 2022-02-04 MED ORDER — PREDNISONE 5 MG PO TABS: 10.0000 mg | ORAL_TABLET | Freq: Every day | ORAL | Status: DC

## 2022-02-04 MED ORDER — PREDNISONE 20 MG PO TABS
40.0000 mg | ORAL_TABLET | Freq: Every day | ORAL | Status: DC
  Administered 2022-02-05 – 2022-02-06 (×2): 40 mg via ORAL
  Filled 2022-02-04 (×2): qty 2

## 2022-02-04 MED ORDER — POTASSIUM CHLORIDE 20 MEQ PO PACK
40.0000 meq | PACK | Freq: Two times a day (BID) | ORAL | Status: DC
  Administered 2022-02-04: 40 meq
  Filled 2022-02-04: qty 2

## 2022-02-04 MED ORDER — MAGNESIUM CITRATE PO SOLN
1.0000 | Freq: Once | ORAL | Status: AC
  Administered 2022-02-04: 1
  Filled 2022-02-04: qty 296

## 2022-02-04 NOTE — Progress Notes (Signed)
Responded to vent alarming to find that patient had self extubated at this time, sats 82%. Patient placed on NRB. Sats up to 100%. Patient in no distress. Patient with strong cough. RN at bedside.  1635: Changed patient over to a 6 Lpm Rice. Sats 98%.  MD made aware. Will continue to monitor.

## 2022-02-04 NOTE — Progress Notes (Addendum)
ANTICOAGULATION CONSULT NOTE - Follow Up Consult  Pharmacy Consult for Warfarin Indication: PE/Antiphospholipid Syndrome  No Known Allergies  Patient Measurements: Height: 5\' 10"  (177.8 cm) Weight: 107.8 kg (237 lb 10.5 oz) IBW/kg (Calculated) : 73  Vital Signs: Temp: 99.5 F (37.5 C) (11/22 0804) Temp Source: Bladder (11/22 0804) BP: 139/77 (11/22 0800) Pulse Rate: 77 (11/22 0804)  Labs: Recent Labs    02/02/22 0410 02/02/22 0417 02/02/22 0447 02/02/22 0653 02/02/22 0731 02/02/22 0814 02/03/22 0617 02/04/22 0827  HGB 8.6* 9.9* 8.8*  --  9.2*  --   --  7.9*  HCT 30.8* 29.0* 26.0*  --  27.0*  --   --  25.5*  PLT 344  --   --   --   --   --   --  323  LABPROT 21.2*  --   --   --   --   --  24.6* 27.1*  INR 1.9*  --   --   --   --   --  2.2* 2.5*  CREATININE 1.32* 1.30*  --   --   --   --  0.81 0.84  CKTOTAL  --   --   --   --   --  40*  --   --   TROPONINIHS 9  --   --  40*  --   --   --   --      Estimated Creatinine Clearance: 106.3 mL/min (by C-G formula based on SCr of 0.84 mg/dL).   Medications:  Scheduled:   arformoterol  15 mcg Nebulization BID   aspirin  81 mg Per Tube Daily   Chlorhexidine Gluconate Cloth  6 each Topical Q0600   famotidine  20 mg Per Tube BID   feeding supplement (PROSource TF20)  60 mL Per Tube BID   furosemide  60 mg Intravenous Q6H   gabapentin  600 mg Per Tube Q8H   insulin aspart  0-15 Units Subcutaneous Q4H   insulin aspart  2 Units Subcutaneous Q4H   magnesium citrate  1 Bottle Per Tube Once   mupirocin ointment  1 Application Nasal BID   mouth rinse  15 mL Mouth Rinse Q2H   polyethylene glycol  17 g Per Tube BID   potassium chloride  40 mEq Per Tube BID   [START ON 02/05/2022] predniSONE  40 mg Oral Q breakfast   Followed by   02/07/2022 ON 02/08/2022] predniSONE  30 mg Oral Q breakfast   Followed by   02/10/2022 ON 02/11/2022] predniSONE  20 mg Oral Q breakfast   Followed by   02/13/2022 ON 02/14/2022] predniSONE  10 mg Oral Q  breakfast   revefenacin  175 mcg Nebulization Daily   rosuvastatin  10 mg Per Tube Daily   topiramate  75 mg Per Tube BID   Warfarin - Pharmacist Dosing Inpatient   Does not apply q1600   Infusions:   dexmedetomidine (PRECEDEX) IV infusion     feeding supplement (VITAL 1.5 CAL) 55 mL/hr at 02/04/22 0800   linezolid (ZYVOX) IV     magnesium sulfate bolus IVPB     piperacillin-tazobactam (ZOSYN)  IV 12.5 mL/hr at 02/04/22 0800    Assessment: 66 YOM admitted for syncope/AMS on warfarin prior to admission for history of PE and antiphospholipid syndrome. Recent admission for buttock hematoma and elevated INR. Pharmacy consulted to continue warfarin during admission.  Unable to obtain exact med history and last doses at this time. Wife does say patient  instructed to take 5mg  daily after recent discharge.   Today, INR 2.5 is therapeutic - trending up. Hgb 7.9. Plts wnl. No bleeding noted per RN. Patient on zosyn and linezolid which may increase INR.   Goal of Therapy:  INR 2-3 (previous discussion about aiming on low end given recurrent falls) Monitor platelets by anticoagulation protocol: Yes   Plan:  Warfarin 2.5mg  x1 Daily INR  CBC every 72 hours  , PharmD, BCPS Clinical Pharmacist 02/04/2022 9:54 AM

## 2022-02-04 NOTE — Progress Notes (Signed)
eLink Physician-Brief Progress Note Patient Name: Jose [name] DOB: 01/22/1956 MRN: [id number]   Date of Service  02/04/2022  HPI/Events of Note  Patient has K+ replacement with Klor-con due; however, no longer has enteral route. NPO.   eICU Interventions  Plan: D/C Klor-con PO. Equivalaent K+ replacement via PIV.      Intervention Category Major Interventions: Electrolyte abnormality - evaluation and management  Lenell Antu 02/04/2022, 9:48 PM

## 2022-02-04 NOTE — Procedures (Addendum)
Patient Name: Jose [name]  MRN: [id number]  Epilepsy Attending: Charlsie Quest  Referring Physician/Provider: Steffanie Dunn, DO  Duration: 02/03/2022 1055 to 02/04/2022 1601   Patient history: 66 year old male with altered mental status.  EEG to evaluate for seizure.   Level of alertness: comatose   AEDs during EEG study: GBP, TPM, Propofol   Technical aspects: This EEG study was done with scalp electrodes positioned according to the 10-20 International system of electrode placement. Electrical activity was reviewed with band pass filter of 1-70Hz , sensitivity of 7 uV/mm, display speed of 49mm/sec with a 60Hz  notched filter applied as appropriate. EEG data were recorded continuously and digitally stored.  Video monitoring was available and reviewed as appropriate.   Description: EEG showed continuous generalized 3 to 6 Hz theta-delta slowing admixed with intermittent generalized 15 to 18 Hz beta activity. Hyperventilation and photic stimulation were not performed.     Event button was pressed on 02/04/2022 at 0129 for upward gaze deviation during bedside care.  Concomitant EEG before, during and after the event did not show any EEG changes to suggest seizure.   ABNORMALITY - Continuous slow, generalized   IMPRESSION: This study is suggestive of severe diffuse encephalopathy, nonspecific etiology but likely due to sedation. No seizures or epileptiform discharges were seen throughout the recording.  One event was recorded on 02/04/2022 at 0129 during which patient was noted to have upward gaze deviation without concomitant EEG change.  This was not an epileptic event.   Rilee Knoll 02/06/2022

## 2022-02-04 NOTE — Progress Notes (Signed)
Subjective: No acute events.   Exam: Vitals:   02/04/22 0600 02/04/22 0630  BP: (!) 143/101 127/67  Pulse: 73 64  Resp: (!) 26 (!) 26  Temp: 99.1 F (37.3 C) 99.5 F (37.5 C)  SpO2: 100% 98%   Gen: In bed, intubated  Neuro: MS: eyes open to voice, , follows commands NA:TFTD are midline, looks across midline bilaterally, resists eye opening. PERRL Motor: follows command to wiggle thumb and toes bilaterally Sensory: responds to stim x 4   Pertinent Labs: Na 142 Cr 0.81  Impression: 66 year old male status post episode of unresponsiveness of unclear etiology.  CPR was performed, though unclear if true cardiac arrest.  pCO2 was 71 on admission. Possibilities include seizure, hypercarbia, cardiac arrest, syncope. 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   Recommendations: 1) would favor minimizing sedation 2) can discontinue EEG 3) neurology will follow once extubated.   Ritta Slot, MD [facility] [phone]  If 7pm- 7am, please page neurology on call as listed in AMION.

## 2022-02-04 NOTE — Progress Notes (Signed)
NAME:  Jose [name], MRN:  [id number], DOB:  12-03-1955, LOS: 2 ADMISSION DATE:  02/02/2022, CONSULTATION DATE:  02/04/22 REFERRING MD:  Sedonia Small, CHIEF COMPLAINT:  Fall   History of Present Illness:  Jose [name] is a 66 y.o. M with PMH of CAD s/p CABG, AVS, PVD, anti-phospholipid syndrome, COPD on 4-6L home O2 and hx non-compliance with trilogy, HL, PE and vertebrobasilar insufficiency causing recurrent syncope who was brought into the ED after being found face down at home after presumed fall.  He seemed to have seizure activity and agonal respirations so required intubation.  He was evaluated by Trauma and CT head/C-spine/abd and pelvis negative.  Labs showed creatinine 1.3, lactic acid 3.4, INR 1.9, trop 9, pH 7.2, pCO2 71.  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 Seizure activity, intubated  Interim History / Subjective:   Remains critically ill intubated on mechanical life support.  Off sedation this morning is following commands.  Objective   Blood pressure 139/77, pulse 77, temperature 99.5 F (37.5 C), temperature source Bladder, resp. rate 20, height 5\' 10"  (1.778 m), weight 107.8 kg, SpO2 100 %.    Vent Mode: PRVC FiO2 (%):  [40 %] 40 % Set Rate:  [26 bmp] 26 bmp Vt Set:  [580 mL] 580 mL PEEP:  [5 cmH20] 5 cmH20 Pressure Support:  [8 cmH20] 8 cmH20 Plateau Pressure:  [22 cmH20-30 cmH20] 30 cmH20   Intake/Output Summary (Last 24 hours) at 02/04/2022 0848 Last  data filed at 02/04/2022 0800 Gross per 24 hour  Intake 2499.23 ml  Output 2425 ml  Net 74.23 ml   Filed Weights   02/02/22 0719 02/03/22 0500 02/04/22 0600  Weight: 118.7 kg 108.6 kg 107.8 kg     General: Patient remains critically ill intubated on mechanical life support Neck: C-collar in place HEENT: NCAT, tracking appropriately opens eyes to voice Neuro: RASS -1, follows commands CV: Regular rhythm, S1-S2 PULM: Bilateral mechanically ventilated breath sounds, thick secretions in [place] GI: Obese soft nontender nondistended GU: Foley in place Extremities: Edema dependent upper extremities and lower extremities Skin: Dry skin bilateral lower extremities   Labs reviewed  Resolved Hospital Problem list     Assessment & Plan:   Acute hypoxic and hypercarbic respiratory failure secondary to fall and possible seizure activity Ongoing tobacco absue AE COPD Aspiration pneumonia RLL Plan: Continue low tidal volume elation Ventilator associated pneumonia prophylaxis Continue antibiotics, add linezolid due to GPC's and positive MRSA swab, continue Zosyn Once speciated can de-escalate Continue PAD protocol sedation. Precedex, as needed fentanyl Continue scheduled bronchodilators and steroids  Syncope, possible cardiac arrest by bystanders  -Appreciate neurology input. EEG: No evidence of seizure  History of vertebrobasilar insufficiency; CT with calcified posterior but patent vessels  -Goal maintain adequate perfusion, at least mean arterial pressure is 65  AKI -Follow I's and O's Positive cumulative fluid balance per I's and O's and clinically on exam. Add diuresis today 60 mg IV Lasix every 6 hours x 3 doses with repletion of potassium and magnesium.  BPH Plan: Holding PTA flomax  Foley in place at this time  History of CAD and CABG -Holding metoprolol Continue rosuvastatin Daily aspirin  Anti-phospholipid syndrome -Continue goal INR 2-3, on Coumadin -  Appreciate pharmacy for dosing.  Hyperglycemia-controlled with minimal insulin requirements -SSI as needed Holding PTA glimepiride Continue SSI and goal CBG 140-180  History of seizures  Continue PTA gabapentin Outpatient neurology follow-up.  Found down DC c-collar  Best Practice (right click and "Reselect all SmartList Selections" daily)   Diet/type: tubefeeds  DVT prophylaxis: Coumadin GI prophylaxis: PPI Lines: N/A Foley:  Yes, and it is still needed Code Status:  full code Last date of multidisciplinary goals of care discussion [wife updated at bedside.  02/04/2022.  Labs   CBC: Recent Labs  Lab 02/02/22 0410 02/02/22 0417 02/02/22 0447 02/02/22 0731 02/04/22 0827  WBC 16.7*  --   --   --  15.3*  HGB 8.6* 9.9* 8.8* 9.2* 7.9*  HCT 30.8* 29.0* 26.0* 27.0* 25.5*  MCV 102.3*  --   --   --  94.4  PLT 344  --   --   --  323    Basic Metabolic Panel: Recent Labs  Lab 02/02/22 0410 02/02/22 0417 02/02/22 0447 02/02/22 0731 02/02/22 1552 02/03/22 0617 02/03/22 1649  NA 145 145 143 144  --  142  --   K 4.8 4.7 4.1 4.1  --  3.3*  --   CL 106 107  --   --   --  107  --   CO2 26  --   --   --   --  24  --   GLUCOSE 166* 166*  --   --   --  110*  --   BUN 26* 28*  --   --   --  22  --   CREATININE 1.32* 1.30*  --   --   --  0.81  --   CALCIUM 8.5*  --   --   --   --  8.6*  --   MG  --   --   --   --  1.9 1.9 1.9  PHOS  --   --   --   --  1.5* 4.3 2.7   GFR: Estimated Creatinine Clearance: 110.3 mL/min (by C-G formula based on SCr of 0.81 mg/dL). Recent Labs  Lab 02/02/22 0410 02/04/22 0827  WBC 16.7* 15.3*  LATICACIDVEN 3.4*  --     This patient is critically ill with multiple organ system failure; which, requires frequent high complexity decision making, assessment, support, evaluation, and titration of therapies. This was completed through the application of advanced monitoring technologies and extensive interpretation of multiple databases. During this  encounter critical care time was devoted to patient care services described in this note for 32 minutes.  Jose Igo, DO [facility] 02/04/2022 8:48 AM

## 2022-02-04 NOTE — Progress Notes (Signed)
Pharmacy ICU Insulin Management  CBGs above goal 140-180 mg/dL: Yes - elevated after methylprednisolone administration  Current regimen (include units of SSI in last 24hr): Utilized 19 units / 24 hrs, on mSSI.   Medications affecting CBG levels: methylprednisolone 40mg  daily - plan for prednisone taper starting 11/23  Plan: Start scheduled 2 units q4h   12/23, PharmD, BCPS Clinical Pharmacist 02/04/2022 9:16 AM

## 2022-02-04 NOTE — Progress Notes (Signed)
Inpatient Diabetes Program Recommendations  AACE/ADA: New Consensus Statement on Inpatient Glycemic Control (2015)  Target Ranges:  Prepandial:   less than 140 mg/dL      Peak postprandial:   less than 180 mg/dL (1-2 hours)      Critically ill patients:  140 - 180 mg/dL   Lab Results  Component Value Date   GLUCAP 133 (H) 02/04/2022   HGBA1C 5.6 02/02/2022    Review of Glycemic Control  Latest Reference Range & Units 02/03/22 15:24 02/03/22 19:28 02/03/22 23:11 02/04/22 03:26 02/04/22 08:02  Glucose-Capillary 70 - 99 mg/dL 268 (H) 341 (H) 962 (H) 112 (H) 133 (H)   Diabetes history: DM  Outpatient Diabetes medications:  Amaryl 4 mg bid Current orders for Inpatient glycemic control:  Novolog 0-15 units q 4 hours Novolog 2 units q 4 hours Vital 55 ml/hr Prendisone taper  Inpatient Diabetes Program Recommendations:    Agree with current addition of Novolog tube feed coverage 2 units q 4 hours. Will follow.   Thanks  Beryl Meager, RN, BC-ADM Inpatient Diabetes Coordinator Pager [phone]  (8a-5p)

## 2022-02-04 NOTE — Progress Notes (Signed)
LTM EEG discontinued - no skin breakdown at unhook. [facility] notified 

## 2022-02-05 DIAGNOSIS — J9601 Acute respiratory failure with hypoxia: Secondary | ICD-10-CM | POA: Diagnosis not present

## 2022-02-05 DIAGNOSIS — R4189 Other symptoms and signs involving cognitive functions and awareness: Secondary | ICD-10-CM | POA: Diagnosis not present

## 2022-02-05 LAB — CULTURE, RESPIRATORY W GRAM STAIN

## 2022-02-05 LAB — BASIC METABOLIC PANEL
Anion gap: 14 (ref 5–15)
BUN: 20 mg/dL (ref 8–23)
CO2: 32 mmol/L (ref 22–32)
Calcium: 9 mg/dL (ref 8.9–10.3)
Chloride: 94 mmol/L — ABNORMAL LOW (ref 98–111)
Creatinine, Ser: 1.01 mg/dL (ref 0.61–1.24)
GFR, Estimated: >60 mL/min (ref >60)
Glucose, Bld: 85 mg/dL (ref 70–99)
Potassium: 4.2 mmol/L (ref 3.5–5.1)
Sodium: 140 mmol/L (ref 135–145)

## 2022-02-05 LAB — GLUCOSE, CAPILLARY
Glucose-Capillary: 83 mg/dL (ref 70–99)
Glucose-Capillary: 84 mg/dL (ref 70–99)
Glucose-Capillary: 84 mg/dL (ref 70–99)
Glucose-Capillary: 203 mg/dL — ABNORMAL HIGH (ref 70–99)
Glucose-Capillary: 297 mg/dL — ABNORMAL HIGH (ref 70–99)
Glucose-Capillary: 221 mg/dL — ABNORMAL HIGH (ref 70–99)

## 2022-02-05 LAB — MAGNESIUM: Magnesium: 2.6 mg/dL — ABNORMAL HIGH (ref 1.7–2.4)

## 2022-02-05 LAB — PROTIME-INR
INR: 2.1 — ABNORMAL HIGH (ref 0.8–1.2)
Prothrombin Time: 23.7 seconds — ABNORMAL HIGH (ref 11.4–15.2)

## 2022-02-05 LAB — PHOSPHORUS: Phosphorus: 4.3 mg/dL (ref 2.5–4.6)

## 2022-02-05 MED ORDER — ASPIRIN 81 MG PO CHEW
81.0000 mg | CHEWABLE_TABLET | Freq: Every day | ORAL | Status: DC
  Administered 2022-02-05 – 2022-02-06 (×2): 81 mg via ORAL
  Filled 2022-02-05 (×2): qty 1

## 2022-02-05 MED ORDER — TORSEMIDE 20 MG PO TABS
20.0000 mg | ORAL_TABLET | Freq: Every day | ORAL | Status: DC
  Administered 2022-02-05 – 2022-02-06 (×2): 20 mg via ORAL
  Filled 2022-02-05 (×2): qty 1

## 2022-02-05 MED ORDER — WARFARIN SODIUM 5 MG PO TABS
5.0000 mg | ORAL_TABLET | Freq: Once | ORAL | Status: AC
  Administered 2022-02-05: 5 mg via ORAL
  Filled 2022-02-05: qty 1

## 2022-02-05 MED ORDER — ROSUVASTATIN CALCIUM 20 MG PO TABS: 10.0000 mg | ORAL_TABLET | Freq: Every day | ORAL | Status: DC

## 2022-02-05 MED ORDER — TOPIRAMATE 25 MG PO TABS
75.0000 mg | ORAL_TABLET | Freq: Three times a day (TID) | ORAL | Status: DC
  Administered 2022-02-05 – 2022-02-06 (×4): 75 mg via ORAL
  Filled 2022-02-05 (×5): qty 3

## 2022-02-05 MED ORDER — ALFUZOSIN HCL ER 10 MG PO TB24
10.0000 mg | ORAL_TABLET | Freq: Every day | ORAL | Status: DC
  Administered 2022-02-05 – 2022-02-06 (×2): 10 mg via ORAL
  Filled 2022-02-05 (×3): qty 1

## 2022-02-05 MED ORDER — INSULIN ASPART 100 UNIT/ML IJ SOLN
0.0000 [IU] | Freq: Three times a day (TID) | INTRAMUSCULAR | Status: DC
  Administered 2022-02-05: 8 [IU] via SUBCUTANEOUS
  Administered 2022-02-05 – 2022-02-06 (×5): 5 [IU] via SUBCUTANEOUS

## 2022-02-05 MED ORDER — DULOXETINE HCL 60 MG PO CPEP
60.0000 mg | ORAL_CAPSULE | Freq: Every day | ORAL | Status: DC
  Administered 2022-02-05 – 2022-02-06 (×2): 60 mg via ORAL
  Filled 2022-02-05 (×2): qty 1

## 2022-02-05 MED ORDER — PANTOPRAZOLE SODIUM 40 MG PO TBEC
40.0000 mg | DELAYED_RELEASE_TABLET | Freq: Two times a day (BID) | ORAL | Status: DC
  Administered 2022-02-05 – 2022-02-06 (×3): 40 mg via ORAL
  Filled 2022-02-05 (×3): qty 1

## 2022-02-05 MED ORDER — TAMSULOSIN HCL 0.4 MG PO CAPS
0.4000 mg | ORAL_CAPSULE | Freq: Every day | ORAL | Status: DC
  Administered 2022-02-05 – 2022-02-06 (×2): 0.4 mg via ORAL
  Filled 2022-02-05 (×2): qty 1

## 2022-02-05 MED ORDER — DOCUSATE SODIUM 100 MG PO CAPS
100.0000 mg | ORAL_CAPSULE | Freq: Two times a day (BID) | ORAL | Status: DC
  Administered 2022-02-05 – 2022-02-06 (×3): 100 mg via ORAL
  Filled 2022-02-05 (×3): qty 1

## 2022-02-05 MED ORDER — FAMOTIDINE 20 MG PO TABS: 20.0000 mg | ORAL_TABLET | Freq: Two times a day (BID) | ORAL | Status: DC

## 2022-02-05 MED ORDER — GABAPENTIN 100 MG PO CAPS
600.0000 mg | ORAL_CAPSULE | Freq: Three times a day (TID) | ORAL | Status: DC
  Administered 2022-02-05 – 2022-02-06 (×4): 600 mg via ORAL
  Filled 2022-02-05 (×4): qty 6

## 2022-02-05 MED ORDER — SILDENAFIL CITRATE 20 MG PO TABS
20.0000 mg | ORAL_TABLET | Freq: Three times a day (TID) | ORAL | Status: DC
  Administered 2022-02-05: 20 mg via ORAL
  Filled 2022-02-05 (×3): qty 1

## 2022-02-05 MED ORDER — TOPIRAMATE 25 MG PO TABS
50.0000 mg | ORAL_TABLET | Freq: Two times a day (BID) | ORAL | Status: DC
  Filled 2022-02-05: qty 2

## 2022-02-05 MED ORDER — ROSUVASTATIN CALCIUM 5 MG PO TABS
10.0000 mg | ORAL_TABLET | Freq: Every day | ORAL | Status: DC
  Administered 2022-02-05 – 2022-02-06 (×2): 10 mg via ORAL
  Filled 2022-02-05: qty 2
  Filled 2022-02-05: qty 1

## 2022-02-05 MED ORDER — POLYETHYLENE GLYCOL 3350 17 G PO PACK
17.0000 g | PACK | Freq: Every day | ORAL | Status: DC
  Administered 2022-02-05 – 2022-02-06 (×2): 17 g via ORAL
  Filled 2022-02-05 (×2): qty 1

## 2022-02-05 NOTE — Progress Notes (Signed)
Subjective: No acute events.   Exam: Vitals:   02/05/22 0900 02/05/22 0935  BP: (!) 117/101 120/60  Pulse: 80 76  Resp: (!) 23 16  Temp: 99.3 F (37.4 C)   SpO2: 99% 100%   Gen: In bed, intubated  Neuro: MS: eyes open to voice, , follows commands RT:MYTR are midline, looks across midline bilaterally, resists eye opening. PERRL Motor: follows command to wiggle thumb and toes bilaterally Sensory: responds to stim x 4   Pertinent Labs: Na 142 Cr 0.81  Impression: 66 year old male with a history of recurrent syncope of unclear etiology.  He was able to give me significantly more history today, he had episodes of passing out 20 years ago that seemed to respond to Topamax.  At his neurologist visit in February, it was noted he was taking 75 mg 3 times daily.  He is currently taking this as well as gabapentin.  He continues to have increasing frequency of loss of consciousness.  The description seems more consistent with syncope, he has a prodrome of lightheadedness, feels his arms and legs shaking and then is "going down."  He has had significant workup including a tilt table test, ambulatory EEG, MRIs and CTAs.  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  One thing of note, he tells me that he has never had prolonged outpatient cardiac monitoring, and has never had one of these episodes captured on a cardiac monitor in the past.  I think an angiogram would be fairly low yield given that he would have to occlude both his vertebrals from neck manipulation in order to cause symptoms, so I think Bow-hunter is relatively unlikely.  With his response to Topamax in the past, I think adjusting antiepileptics and pursuing cardiac monitoring might be the most prudent course.  I will discuss this more with him and his wife when she is here.  1) increase Topamax to his home dose of 75 mg 3 times daily 2)  consider Zio patch or ILR 3) Neurology to follow.   Ritta Slot, MD [facility] [phone]  If 7pm- 7am, please page neurology on call as listed in AMION.

## 2022-02-05 NOTE — Evaluation (Signed)
Physical Therapy Evaluation Patient Details Name: Jose [name] MRN: [id number] DOB: 06-03-1955 Today's Date: 02/05/2022  History of Present Illness  Pt is 66 year old presented to [facility] on  02/02/22 after being found down at home after presumed fall/syncopal event. Agonal respirations and ?seizure actitivity. Intubated in ED. Self extubated 11/22. PMH - recurrent syncope, antiphospholipid syndrome, copd, PE, vertebrobasilar insufficiency, sleep apnea, cabg  Clinical Impression  Pt presents to PT with limited mobility due to weakness, confusion, and orthostasis. Able to stand and get to the chair with assist but with drop in BP I was not comfortable attempting ambulation on my own. Will need to see how much support pt has at home (pt still with some confusion and unsure if his answers are accurate about home support). His fall risk will remain extremely high with his history of frequent syncope. If family able to provide assist at home may be able to go home with HHPT. If not will need to look at other options.      Recommendations for follow up therapy are one component of a multi-disciplinary discharge planning process, led by the attending physician.  Recommendations may be updated based on patient status, additional functional criteria and insurance authorization.  Follow Up Recommendations Home health PT (if spouse available to assist at home. If not may need SNF.)      Assistance Recommended at Discharge Frequent or constant Supervision/Assistance  Patient can return home with the following  Help with stairs or ramp for entrance;A little help with bathing/dressing/bathroom;A little help with walking and/or transfers;Assist for transportation    Equipment Recommendations None recommended by PT  Recommendations for Other Services  OT consult    Functional Status Assessment Patient has had a recent decline in their functional status and demonstrates the ability to make significant  improvements in function in a reasonable and predictable amount of time.     Precautions / Restrictions Precautions Precautions: Fall;Other (comment) Precaution Comments: Recurrent syncope, orthostatic      Mobility  Bed Mobility Overal bed mobility: Needs Assistance Bed Mobility: Supine to Sit     Supine to sit: Min assist     General bed mobility comments: Assist to elevate trunk into sitting    Transfers Overall transfer level: Needs assistance Equipment used: Rolling walker (2 wheels) Transfers: Sit to/from Stand, Bed to chair/wheelchair/BSC Sit to Stand: Min assist   Step pivot transfers: Min assist       General transfer comment: Assist for balance and safety. Bed to chair with hand held assist.    Ambulation/Gait             Pre-gait activities: Stood at bedside x 4 minutes and sidestepped up toward HOB. General Gait Details: Did not attempt due to orthostatic  Stairs            Wheelchair Mobility    Modified Rankin (Stroke Patients Only)       Balance Overall balance assessment: Needs assistance Sitting-balance support: No upper extremity supported, Feet supported Sitting balance-[name] Scale: Good     Standing balance support: Bilateral upper extremity supported, During functional activity Standing balance-[name] Scale: Poor Standing balance comment: Walker and min guard for static standing                             Pertinent Vitals/Pain Pain Assessment Pain Assessment: No/denies pain    Home Living Family/patient expects to be discharged to:: Private residence Living Arrangements: Spouse/significant  other Available Help at Discharge: Family;Available PRN/intermittently Type of Home: House Home Access: Stairs to enter Entrance Stairs-Rails: Doctor, general practice of Steps: 3-4   Home Layout: One level Home Equipment: Rollator (4 wheels) Additional Comments: Sleeps in recliner. Unsure of accuracy of  above from pt. It conflicts some with prior encounter.    Prior Function Prior Level of Function : Patient poor historian/Family not available             Mobility Comments: Per pt he is modified independent with household ambulation without assistive device. Frequent syncope. Sleeps in recliner       Hand Dominance        Extremity/Trunk Assessment   Upper Extremity Assessment Upper Extremity Assessment: Defer to OT evaluation    Lower Extremity Assessment Lower Extremity Assessment: Generalized weakness       Communication   Communication: No difficulties  Cognition Arousal/Alertness: Awake/alert Behavior During Therapy: WFL for tasks assessed/performed Overall Cognitive Status: Impaired/Different from baseline Area of Impairment: Orientation, Memory, Following commands, Awareness, Problem solving                 Orientation Level: Disoriented to, Place, Time   Memory: Decreased short-term memory Following Commands: Follows one step commands consistently   Awareness: Emergent Problem Solving: Requires verbal cues          General Comments General comments (skin integrity, edema, etc.): See assessment for orthostatic vitals. SpO2 >90% on 3L    Exercises     Assessment/Plan    PT Assessment Patient needs continued PT services  PT Problem List Decreased strength;Decreased balance;Decreased mobility;Cardiopulmonary status limiting activity;Decreased cognition;Decreased activity tolerance       PT Treatment Interventions DME instruction;Gait training;Stair training;Functional mobility training;Therapeutic activities;Therapeutic exercise;Balance training;Cognitive remediation;Patient/family education    PT Goals (Current goals can be found in the Care Plan section)  Acute Rehab PT Goals Patient Stated Goal: not stated PT Goal Formulation: With patient Time For Goal Achievement: 02/19/22 Potential to Achieve Goals: Fair    Frequency Min 3X/week      Co-evaluation               AM-PAC PT "6 Clicks" Mobility  Outcome Measure Help needed turning from your back to your side while in a flat bed without using bedrails?: A Little Help needed moving from lying on your back to sitting on the side of a flat bed without using bedrails?: A Little Help needed moving to and from a bed to a chair (including a wheelchair)?: A Little Help needed standing up from a chair using your arms (e.g., wheelchair or bedside chair)?: A Little Help needed to walk in hospital room?: Total Help needed climbing 3-5 steps with a railing? : Total 6 Click Score: 14    End of Session Equipment Utilized During Treatment: Gait belt;Oxygen Activity Tolerance: Treatment limited secondary to medical complications (Comment) (orthostatic) Patient left: in chair;with call bell/phone within reach;with chair alarm set Nurse Communication: Mobility status;Other (comment) (orthostatic) PT Visit Diagnosis: Unsteadiness on feet (R26.81);Other abnormalities of gait and mobility (R26.89);Difficulty in walking, not elsewhere classified (R26.2);Repeated falls (R29.6)    Time: 3343-5686 PT Time Calculation (min) (ACUTE ONLY): 34 min   Charges:   PT Evaluation $PT Eval Moderate Complexity: 1 Mod PT Treatments $Gait Training: 8-22 mins        [facility] PT Acute Rehabilitation Services Office [phone]   [name] [place] [facility] 02/05/2022, 12:52 PM

## 2022-02-05 NOTE — Progress Notes (Signed)
ANTICOAGULATION CONSULT NOTE - Follow Up Consult  Pharmacy Consult for Warfarin Indication: PE/Antiphospholipid Syndrome  No Known Allergies  Patient Measurements: Height: 5\' 10"  (177.8 cm) Weight: 107.8 kg (237 lb 10.5 oz) IBW/kg (Calculated) : 73  Vital Signs: Temp: 99.1 F (37.3 C) (11/23 0800) Temp Source: Oral (11/23 0728) BP: 149/77 (11/23 0800) Pulse Rate: 83 (11/23 0805)  Labs: Recent Labs    02/03/22 0617 02/04/22 0827 02/05/22 0101 02/05/22 0403  HGB  --  7.9*  --   --   HCT  --  25.5*  --   --   PLT  --  323  --   --   LABPROT 24.6* 27.1* 23.7*  --   INR 2.2* 2.5* 2.1*  --   CREATININE 0.81 0.84  --  1.01     Estimated Creatinine Clearance: 88.4 mL/min (by C-G formula based on SCr of 1.01 mg/dL).   Medications:  Scheduled:   alfuzosin  10 mg Oral Q breakfast   arformoterol  15 mcg Nebulization BID   aspirin  81 mg Oral Daily   Chlorhexidine Gluconate Cloth  6 each Topical Q0600   DULoxetine  60 mg Oral Daily   gabapentin  600 mg Oral Q8H   insulin aspart  0-15 Units Subcutaneous TID AC & HS   mupirocin ointment  1 Application Nasal BID   pantoprazole  40 mg Oral BID   predniSONE  40 mg Oral Q breakfast   Followed by   02/07/22 ON 02/08/2022] predniSONE  30 mg Oral Q breakfast   Followed by   02/10/2022 ON 02/11/2022] predniSONE  20 mg Oral Q breakfast   Followed by   02/13/2022 ON 02/14/2022] predniSONE  10 mg Oral Q breakfast   revefenacin  175 mcg Nebulization Daily   rosuvastatin  10 mg Oral Daily   sildenafil  20 mg Oral TID   tamsulosin  0.4 mg Oral Daily   topiramate  50 mg Oral BID   torsemide  20 mg Oral Daily   warfarin  2.5 mg Oral ONCE-1600   Warfarin - Pharmacist Dosing Inpatient   Does not apply q1600   Infusions:   linezolid (ZYVOX) IV Stopped (02/04/22 2328)    Assessment: 66 YOM admitted for syncope/AMS on warfarin prior to admission for history of PE and antiphospholipid syndrome. Recent admission for buttock hematoma and elevated  INR. Pharmacy consulted to continue warfarin during admission.  Unable to obtain exact med history and last doses at this time. Wife does say patient instructed to take 5mg  daily after recent discharge.   Did not receive 11/22 warfarin dose due to extubation without swallow eval. INR 2.1 this morning which remains therapeutic  Goal of Therapy:  INR 2-3 (previous discussion about aiming on low end given recurrent falls) Monitor platelets by anticoagulation protocol: Yes   Plan:  Warfarin 5mg  PO once Daily INR  CBC every 72 hours  , PharmD Clinical Pharmacist 02/05/2022 8:43 AM

## 2022-02-05 NOTE — Progress Notes (Signed)
SLP Cancellation Note  Patient Details Name: Jose [name] MRN: [id number] DOB: [birth year]   Cancelled evaluation - Pt passed yale swallow screen; RN reports pt doing well with swallowing. No formal SLP swallow eval needed per protocol. Our service will sign off.  Ginelle Bays L. Samson Frederic, MA CCC/SLP Clinical Specialist - Acute Care SLP Acute Rehabilitation Services Office number [phone]           Blenda Mounts Laurice 02/05/2022, 8:27 AM

## 2022-02-05 NOTE — Plan of Care (Signed)

## 2022-02-05 NOTE — TOC CAGE-AID Note (Signed)
Transition of Care [facility]) - CAGE-AID Screening   Patient Details  Name: Jose [name] MRN: [id number] Date of Birth: December 10, 1955  Transition of Care [facility]) CM/SW Contact:    Janora Norlander, RN Phone Number: [phone] 02/05/2022, 11:30 AM   Clinical Narrative: Pt intubated - therefore unable to complete screening.    CAGE-AID Screening: Substance Abuse Screening unable to be completed due to: : Patient unable to participate

## 2022-02-05 NOTE — Progress Notes (Addendum)
NAME:  Jose [name], MRN:  [id number], DOB:  Sep 11, 1955, LOS: 3 ADMISSION DATE:  02/02/2022, CONSULTATION DATE:  02/05/22 REFERRING MD:  Pilar Plate, CHIEF COMPLAINT:  Fall   History of Present Illness:  Jose [name] is a 66 y.o. M with PMH of CAD s/p CABG, AVS, PVD, anti-phospholipid syndrome, COPD on 4-6L home O2 and hx non-compliance with trilogy, HL, PE and vertebrobasilar insufficiency causing recurrent syncope who was brought into the ED after being found face down at home after presumed fall.  He seemed to have seizure activity and agonal respirations so required intubation.  He was evaluated by Trauma and CT head/C-spine/abd and pelvis negative.  Labs showed creatinine 1.3, lactic acid 3.4, INR 1.9, trop 9, pH 7.2, pCO2 71.  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 Seizure activity, intubated 11/22 self extubated  Interim History / Subjective:  He is awake.  He is in no distress.  Does have some short-term memory deficits.  Weaning oxygen.   Objective   Blood pressure (Abnormal) 158/89, pulse 87, temperature 98.1 F (36.7 C), temperature source Oral, resp. rate 20, height 5\' 10"  (1.778 m), weight 107.8 kg, SpO2 100 %.    Vent Mode: PRVC FiO2 (%):  [40 %] 40 % Set Rate:  [26 bmp] 26 bmp Vt Set:  [580 mL] 580 mL PEEP:  [5 cmH20] 5 cmH20 Pressure Support:  [10 cmH20-12 cmH20] 12 cmH20 Plateau Pressure:  [23 cmH20-30 cmH20] 23 cmH20   Intake/Output Summary (Last 24  hours) at 02/05/2022 0732 Last data filed at 02/05/2022 0300 Gross per 24 hour  Intake 2387.87 ml  Output 6350 ml  Net -3962.13 ml   Filed Weights   02/03/22 0500 02/04/22 0600 02/05/22 0500  Weight: 108.6 kg 107.8 kg 107.8 kg   General this is an obese 66 year old chronically ill male patient is resting in bed and currently in no acute distress HEENT normocephalic atraumatic no jugular venous distention appreciated mucous membranes are moist Pulmonary: Some scattered rhonchi, currently no accessory use on 3 L nasal cannula pulse oximetry 100% Portable chest x-ray personally reviewed from 11/21: Right basilar airspace disease Cardiac: Regular rate and rhythm with holosystolic murmur that radiates to the carotids Abdomen soft nontender no organomegaly bowel sounds are present Extremities: Chronic dry cracked lower extremities with chronic venous stasis changes Neuro: Awake, oriented x 3 however requires frequent reminders of where he is and why he is here, has some short-term memory deficit.  Moving all extremities.  No focal motor deficits are appreciated. GU: Clear yellow urine.   Resolved Hospital Problem list   AKI  Assessment & Plan:   Acute hypoxic and hypercarbic respiratory failure secondary to fall and possible seizure activity Ongoing tobacco absue AE COPD Aspiration pneumonia RLL Self extubated 11/22 Plan: Wean oxygen IS as able Mobilize  Cont BDs Pred taper  Abx day 3, day 2 (zyvox for SA coverage) will continue Zyvox and narrow once sensitivities are back and plan for a total of 7 days antibiotics Nighttime trilogy  Syncope, possible cardiac arrest by bystanders  -Appreciate  neurology input. EEG: No evidence of seizure Plan Monitor   History of vertebrobasilar insufficiency; CT with calcified posterior but patent vessels  Plan MAP goal > 65  BPH Plan: Resume Flomax Foley in place, can DC after Flomax initiated   History of CAD and CABG Plan Cont  asa and statin Holding metoprolol   Anti-phospholipid syndrome Plan Pharmacy dosing coumadin INR goal 2-3   Hyperglycemia-controlled with minimal insulin requirements Holding PTA glimepiride Plan Ssi Goal 140-180   History of seizures  Plan Cont gabapentin  F/u w/ neuro outpt    Best Practice (right click and "Reselect all SmartList Selections" daily)   Diet/type: Regular consistency (see orders)  DVT prophylaxis: Coumadin GI prophylaxis: PPI Lines: N/A Foley:  Yes, order to remove after flomax  Code Status:  full code Last date of multidisciplinary goals of care discussion [wife updated at bedside.  02/04/2022.  He is stable for discharge out of the intensive care to progressive  Simonne Martinet ACNP-BC [facility] Pulmonary/Critical Care Pager # [phone] OR # [phone] if no answer   --------------------------------------  Attending note: I have seen and examined the patient. History, labs and imaging reviewed.  66 year old with history of coronary artery disease, peripheral vascular disease, COPD presenting with altered mental status, seizure activity.  He was treated for COPD exacerbation, aspiration pneumonia.  Self extubated yesterday and has been doing well overnight.  Blood pressure 120/60, pulse 76, temperature 99.3 F (37.4 C), resp. rate 16, height 5\' 10"  (1.778 m), weight 107.8 kg, SpO2 100 %. Gen:      No acute distress, obese HEENT:  EOMI, sclera anicteric Neck:     No masses; no thyromegaly Lungs:   Diminished breath sounds.  No wheeze appreciated CV:         Regular rate and rhythm; no murmurs Abd:      + bowel sounds; soft, non-tender; no palpable masses, no distension Ext:    No edema; adequate peripheral perfusion Skin:      Warm and dry; no rash Neuro: alert and oriented x 3 Psych: normal mood and affect   Labs/Imaging personally reviewed, significant for WBC 15.3, hemoglobin 7.9, platelets 323 INR 2.1 No new  imaging  Assessment/plan: Acute hypoxic, hypercarbic respiratory failure Secondary to COPD exacerbation, Staph aureus pneumonia Wean oxygen as tolerated, continue bronchodilators, prednisone taper Continue Zyvox for now.  Can narrow antibiotics once sensitivities are back on cultures.  Acute metabolic encephalopathy Syncope, history of seizures No evidence of seizure on EEG. Continue gabapentin  Stable for transfer out of ICU and to hospitalist service.  MD [place] [facility] 02/05/2022, 10:37 AM

## 2022-02-06 DIAGNOSIS — E669 Obesity, unspecified: Secondary | ICD-10-CM | POA: Insufficient documentation

## 2022-02-06 DIAGNOSIS — Z951 Presence of aortocoronary bypass graft: Secondary | ICD-10-CM

## 2022-02-06 DIAGNOSIS — E119 Type 2 diabetes mellitus without complications: Secondary | ICD-10-CM

## 2022-02-06 DIAGNOSIS — Z720 Tobacco use: Secondary | ICD-10-CM | POA: Insufficient documentation

## 2022-02-06 DIAGNOSIS — R4189 Other symptoms and signs involving cognitive functions and awareness: Secondary | ICD-10-CM | POA: Diagnosis not present

## 2022-02-06 DIAGNOSIS — I350 Nonrheumatic aortic (valve) stenosis: Secondary | ICD-10-CM

## 2022-02-06 DIAGNOSIS — I739 Peripheral vascular disease, unspecified: Secondary | ICD-10-CM | POA: Insufficient documentation

## 2022-02-06 DIAGNOSIS — Z86711 Personal history of pulmonary embolism: Secondary | ICD-10-CM

## 2022-02-06 DIAGNOSIS — I10 Essential (primary) hypertension: Secondary | ICD-10-CM | POA: Insufficient documentation

## 2022-02-06 DIAGNOSIS — R55 Syncope and collapse: Secondary | ICD-10-CM | POA: Diagnosis not present

## 2022-02-06 DIAGNOSIS — I6529 Occlusion and stenosis of unspecified carotid artery: Secondary | ICD-10-CM | POA: Insufficient documentation

## 2022-02-06 DIAGNOSIS — I5030 Unspecified diastolic (congestive) heart failure: Secondary | ICD-10-CM | POA: Insufficient documentation

## 2022-02-06 DIAGNOSIS — I2510 Atherosclerotic heart disease of native coronary artery without angina pectoris: Secondary | ICD-10-CM | POA: Insufficient documentation

## 2022-02-06 DIAGNOSIS — D6861 Antiphospholipid syndrome: Secondary | ICD-10-CM | POA: Insufficient documentation

## 2022-02-06 DIAGNOSIS — G4733 Obstructive sleep apnea (adult) (pediatric): Secondary | ICD-10-CM | POA: Insufficient documentation

## 2022-02-06 LAB — PROTIME-INR
INR: 1.7 — ABNORMAL HIGH (ref 0.8–1.2)
Prothrombin Time: 19.5 seconds — ABNORMAL HIGH (ref 11.4–15.2)

## 2022-02-06 LAB — GLUCOSE, CAPILLARY
Glucose-Capillary: 214 mg/dL — ABNORMAL HIGH (ref 70–99)
Glucose-Capillary: 217 mg/dL — ABNORMAL HIGH (ref 70–99)
Glucose-Capillary: 214 mg/dL — ABNORMAL HIGH (ref 70–99)

## 2022-02-06 MED ORDER — METOPROLOL TARTRATE 25 MG PO TABS: ORAL_TABLET | ORAL | Status: DC

## 2022-02-06 MED ORDER — WARFARIN SODIUM 5 MG PO TABS
7.5000 mg | ORAL_TABLET | Freq: Once | ORAL | Status: AC
  Administered 2022-02-06: 7.5 mg via ORAL
  Filled 2022-02-06: qty 1

## 2022-02-06 MED ORDER — LINEZOLID 600 MG PO TABS: 600.0000 mg | ORAL_TABLET | Freq: Two times a day (BID) | ORAL | 0 refills | Status: DC

## 2022-02-06 MED ORDER — ENOXAPARIN SODIUM 100 MG/ML IJ SOSY
100.0000 mg | PREFILLED_SYRINGE | Freq: Two times a day (BID) | INTRAMUSCULAR | Status: DC
  Administered 2022-02-06: 100 mg via SUBCUTANEOUS
  Filled 2022-02-06: qty 1

## 2022-02-06 MED ORDER — TAMSULOSIN HCL 0.4 MG PO CAPS: 0.4000 mg | ORAL_CAPSULE | Freq: Every day | ORAL | 0 refills | Status: AC

## 2022-02-06 MED ORDER — TOPIRAMATE 50 MG PO TABS: 75.0000 mg | ORAL_TABLET | Freq: Two times a day (BID) | ORAL | 1 refills | Status: AC

## 2022-02-06 NOTE — Progress Notes (Signed)
Subjective: No acute events.   Exam: Vitals:   02/06/22 0940 02/06/22 1142  BP:  (!) 106/56  Pulse: 78 75  Resp: 20 16  Temp:  97.9 F (36.6 C)  SpO2: 100% 98%   Gen: In bed, extubated  Neuro: MS: Awake, alert, oriented except for he says it is December before he corrects himself to November. CN: EOMI, visual fields full, face symmetric Motor: No drift  sensory: responds to stim x 4   Pertinent Labs: Na 142 Cr 0.81  Impression: 66 year old male with a history of recurrent syncope of unclear etiology.  He was able to give me significantly more history today, he had episodes of passing out 20 years ago that seemed to respond to Topamax.  He currently is taking 50 mg three times a day.  He has had significant workup including a tilt table test, ambulatory EEG, MRIs and CTAs.  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  One thing of note, he tells me that he has never had prolonged outpatient cardiac monitoring, and has never had one of these episodes captured on a cardiac monitor in the past.  I think an angiogram would be fairly low yield given that he would have to occlude both his vertebrals from neck manipulation in order to cause symptoms, so I think Bow-hunter is relatively unlikely.  With his response to Topamax in the past, I think adjusting antiepileptics and pursuing cardiac monitoring might be the most prudent course.   If he continues to have no explanation, an angiogram may be considered in the future, but given how low yield it is at the current time with other higher yield options, I would favor holding off for now.  1) continue Topamax 75 mg twice daily, this is increased from his home dose of 50 mg 3 times daily. 2) I would recommend event monitor or ILR. 3) he can follow-up with outpatient neurology, neurology will be available if further questions or concerns  Ritta Slot, MD [facility] [phone]  If 7pm- 7am, please page neurology on call as listed in AMION.

## 2022-02-06 NOTE — Evaluation (Signed)
Occupational Therapy Evaluation Patient Details Name: Jose [name] MRN: [id number] DOB: [birth year] Today's Date: 02/06/2022   History of Present Illness Pt is 66 year old presented to [facility] on  02/02/22 after being found down at home after presumed fall/syncopal event. Agonal respirations and ?seizure actitivity. Intubated in ED. Self extubated 11/22. PMH - recurrent syncope, antiphospholipid syndrome, copd, PE, vertebrobasilar insufficiency, sleep apnea, cabg   Clinical Impression   Patient quickly assessed for [facility] OT needs, and based on currently level of function versus home status, and assist as needed via his family, no HH OT is needed.  Patient is not necessarily interested in [facility] OT.  Can defer to [facility] PT once assessed to determine if a need exists in his home environment.  Patient is expecting to discharge today, and no further OT needs exist in the acute setting.       Recommendations for follow up therapy are one component of a multi-disciplinary discharge planning process, led by the attending physician.  Recommendations may be updated based on patient status, additional functional criteria and insurance authorization.   Follow Up Recommendations  No OT follow up     Assistance Recommended at Discharge Intermittent Supervision/Assistance  Patient can return home with the following Assist for transportation;Assistance with cooking/housework;Help with stairs or ramp for entrance    Functional Status Assessment  Patient has not had a recent decline in their functional status  Equipment Recommendations  None recommended by OT    Recommendations for Other Services       Precautions / Restrictions Precautions Precautions: Fall;Other (comment) Precaution Comments: Recurrent syncope, orthostatic Restrictions Weight Bearing Restrictions: No      Mobility Bed Mobility               General bed mobility comments: Pt up in recliner    Transfers Overall transfer level:  Needs assistance Equipment used: Rolling walker (2 wheels) Transfers: Sit to/from Stand Sit to Stand: Supervision                  Balance Overall balance assessment: Needs assistance Sitting-balance support: Feet supported Sitting balance-[name] Scale: Good     Standing balance support: Reliant on assistive device for balance Standing balance-[name] Scale: Fair                             ADL either performed or assessed with clinical judgement   ADL Overall ADL's : At baseline                                             Vision Baseline Vision/History: 1 Wears glasses Patient Visual Report: No change from baseline       Perception     Praxis      Pertinent Vitals/Pain Pain Assessment Pain Assessment: No/denies pain     Hand Dominance Right   Extremity/Trunk Assessment Upper Extremity Assessment Upper Extremity Assessment: Overall WFL for tasks assessed   Lower Extremity Assessment Lower Extremity Assessment: Defer to PT evaluation   Cervical / Trunk Assessment Cervical / Trunk Assessment: Normal   Communication Communication Communication: No difficulties   Cognition Arousal/Alertness: Awake/alert   Overall Cognitive Status: Within Functional Limits for tasks assessed  General Comments  PT treatment: Pt on 4L O2 with SpO2 >95%. Sitting BP prior to amb 116/53. Sitting BP after amb 134/60. Asked pt if he was light headed he reported only a "little"    Exercises     Shoulder Instructions      Home Living Family/patient expects to be discharged to:: Private residence Living Arrangements: Spouse/significant other Available Help at Discharge: Family;Available PRN/intermittently Type of Home: House Home Access: Stairs to enter [employer] of Steps: 3-4 Entrance Stairs-Rails: Right;Left Home Layout: One level     Bathroom Shower/Tub: Tub/shower  unit;Walk-in shower   Bathroom Toilet: Standard     Home Equipment: Rollator (4 wheels)   Additional Comments: Sleeps in recliner.      Prior Functioning/Environment               Mobility Comments: Per pt he is modified independent with household ambulation without assistive device. Frequent syncope. Sleeps in recliner ADLs Comments: Assist as needed for lower body ADL, spouse assists with iADL and community mobility.        OT Problem List: Decreased activity tolerance;Impaired balance (sitting and/or standing);Increased edema      OT Treatment/Interventions:      OT Goals(Current goals can be found in the care plan section) Acute Rehab OT Goals Patient Stated Goal: Return home today OT Goal Formulation: With patient Time For Goal Achievement: 02/06/22 Potential to Achieve Goals: Good  OT Frequency:      Co-evaluation              AM-PAC OT "6 Clicks" Daily Activity     Outcome Measure Help from another person eating meals?: None Help from another person taking care of personal grooming?: None Help from another person toileting, which includes using toliet, bedpan, or urinal?: A Little Help from another person bathing (including washing, rinsing, drying)?: A Little Help from another person to put on and taking off regular upper body clothing?: None Help from another person to put on and taking off regular lower body clothing?: A Little 6 Click Score: 21   End of Session Equipment Utilized During Treatment: Rolling walker (2 wheels) Nurse Communication: Mobility status  Activity Tolerance: Patient tolerated treatment well Patient left: in chair;with call bell/phone within reach;with family/visitor present  OT Visit Diagnosis: Unsteadiness on feet (R26.81)                Time: 8756-4332 OT Time Calculation (min): 18 min Charges:  OT General Charges $OT Visit: 1 Visit OT Evaluation $OT Eval Moderate Complexity: 1 Mod  02/06/2022  RP, OTR/L  Acute  Rehabilitation Services  Office:  [phone]   Suzanna Obey 02/06/2022, 2:42 PM

## 2022-02-06 NOTE — Progress Notes (Signed)
ANTICOAGULATION CONSULT NOTE - Initial Consult  Pharmacy Consult for Warfarin with Lovenox bridge Indication: PE/Antiphospholipid Syndrome  No Known Allergies  Patient Measurements: Height: 5\' 10"  (177.8 cm) Weight: 107.8 kg (237 lb 10.5 oz) IBW/kg (Calculated) : 73  Vital Signs: Temp: 97.8 F (36.6 C) (11/24 0737) Temp Source: Oral (11/24 0737) BP: 127/54 (11/24 0737) Pulse Rate: 78 (11/24 0940)  Labs: Recent Labs    02/04/22 0827 02/05/22 0101 02/05/22 0403 02/06/22 0213  HGB 7.9*  --   --   --   HCT 25.5*  --   --   --   PLT 323  --   --   --   LABPROT 27.1* 23.7*  --  19.5*  INR 2.5* 2.1*  --  1.7*  CREATININE 0.84  --  1.01  --      Estimated Creatinine Clearance: 88.4 mL/min (by C-G formula based on SCr of 1.01 mg/dL).   Medications:  Scheduled:   alfuzosin  10 mg Oral Q breakfast   arformoterol  15 mcg Nebulization BID   aspirin  81 mg Oral Daily   docusate sodium  100 mg Oral BID   DULoxetine  60 mg Oral Daily   gabapentin  600 mg Oral Q8H   insulin aspart  0-15 Units Subcutaneous TID AC & HS   mupirocin ointment  1 Application Nasal BID   pantoprazole  40 mg Oral BID   polyethylene glycol  17 g Oral Daily   predniSONE  40 mg Oral Q breakfast   Followed by   02/08/22 ON 02/08/2022] predniSONE  30 mg Oral Q breakfast   Followed by   02/10/2022 ON 02/11/2022] predniSONE  20 mg Oral Q breakfast   Followed by   02/13/2022 ON 02/14/2022] predniSONE  10 mg Oral Q breakfast   revefenacin  175 mcg Nebulization Daily   rosuvastatin  10 mg Oral Daily   tamsulosin  0.4 mg Oral Daily   topiramate  75 mg Oral TID   torsemide  20 mg Oral Daily   Warfarin - Pharmacist Dosing Inpatient   Does not apply q1600   Infusions:   linezolid (ZYVOX) IV 600 mg (02/06/22 0936)    Assessment: Jose [name] admitted for syncope/AMS on warfarin prior to admission for history of PE and antiphospholipid syndrome. Recent admission for buttock hematoma and elevated INR. Pharmacy consulted to  continue warfarin during admission.  Unable to obtain exact med history and last doses at this time. Wife does say patient instructed to take 5mg  daily after recent discharge.   Did not receive 11/22 warfarin dose due to extubation without swallow eval.  INR is SUB therapeutic this morning at 1.7.   Goal of Therapy:  INR 2-3 (previous discussion about aiming on low end given recurrent falls) Monitor platelets by anticoagulation protocol: Yes   Plan:  Give warfarin 7.5 mg po x 1 Monitor daily INR, CBC, clinical course, s/sx of bleed, PO intake/diet, Drug-Drug Interactions Start Lovenox 100 mg subQ every 12 hours (bridge while INR subtherapeutic)   Thank you for allowing to participate in this patients care. 12/22, PharmD 02/06/2022 11:24 AM  **Pharmacist phone directory can be found on [email or web address] listed under [facility]**

## 2022-02-06 NOTE — TOC Initial Note (Signed)
Transition of Care [facility]) - Initial/Assessment Note    Patient Details  Name: Jose [name] MRN: [id number] Date of Birth: Jul 26, 1955  Transition of [facility]) CM/SW Contact:    Kermit Balo, RN Phone Number: 02/06/2022, 11:02 AM  Clinical Narrative:                 Pt is from home with his spouse. He states she is with him most of the time. He is not interested in SNF rehab at d/c. He is currently active with [facility] for home health and wishes to continue with their services. Morrie Sheldon with [facility] aware of HH orders.  Wife can oversee things at home and provide needed transportation.  She will transport home at d/c.   Expected Discharge Plan: Home w Home Health Services Barriers to Discharge: Continued Medical Work up   Patient Goals and CMS Choice Patient states their goals for this hospitalization and ongoing recovery are:: To return home CMS Medicare.gov Compare Post Acute Care list provided to:: Patient Choice offered to / list presented to : Patient  Expected Discharge Plan and Services Expected Discharge Plan: Home w Home Health Services   Discharge Planning Services: CM Consult Post Acute Care Choice: Home Health Living arrangements for the past 2 months: Single Family Home                           HH Arranged: PT, RN [facility] Agency: [facility] ([facility]) Date HH Agency Contacted: 02/06/22   Representative spoke with at [facility] Agency: Morrie Sheldon  Prior Living Arrangements/Services Living arrangements for the past 2 months: Single Family Home Lives with:: Spouse Patient language and need for interpreter reviewed:: Yes Do you feel safe going back to the place where you live?: Yes      Need for Family Participation in Patient Care: Yes (Comment) Care giver support system in place?: Yes (comment) Current home services: DME (oxygen with [employer]/ [employer]/ rollator/ shower seat/ trilogy) Criminal Activity/Legal Involvement Pertinent to Current  Situation/Hospitalization: No - Comment as needed  Activities of Daily Living      Permission Sought/Granted Permission sought to share information with : Case Manager, Family Supports Permission granted to share information with : Yes, Verbal Permission Granted              Emotional Assessment Appearance:: Appears stated age Attitude/Demeanor/Rapport: Engaged Affect (typically observed): Accepting Orientation: : Oriented to Self, Oriented to Place, Oriented to  Time, Oriented to Situation Alcohol / Substance Use: Not Applicable Psych Involvement: No (comment)  Admission diagnosis:  Altered mental status [R41.82] Anticoagulated [Z79.01] Unresponsive [R41.89] Fall, initial encounter [W19.XXXA] Altered mental status, unspecified altered mental status type [R41.82] Patient Active Problem List   Diagnosis Date Noted   T2DM (type 2 diabetes mellitus) (HCC) 02/06/2022   (HFpEF) heart failure with preserved ejection fraction (HCC) 02/06/2022   OSA (obstructive sleep apnea) 02/06/2022   CAD (coronary artery disease) 02/06/2022   Hx of CABG 02/06/2022   PAD (peripheral artery disease) (HCC) 02/06/2022   Carotid artery stenosis 02/06/2022   Aortic stenosis 02/06/2022   Antiphospholipid syndrome (HCC) 02/06/2022   History of maternal pulmonary embolus 02/06/2022   Essential hypertension 02/06/2022   Tobacco abuse 02/06/2022   Obesity (BMI 30-39.9) 02/06/2022   Recurrent syncope 02/06/2022   COPD with acute exacerbation (HCC) 02/04/2022   On mechanically assisted ventilation (HCC) 02/03/2022   Acute respiratory failure with hypoxia (HCC) 02/03/2022   Altered mental status 02/02/2022  Fall 02/02/2022   Chronic respiratory failure with hypoxia and hypercapnia (HCC) 02/02/2022   PCP:  [name], MD Pharmacy:   [facility] [address] [facility] - [place], [place] - [address] [place] [address] Phone: [phone] Fax:  [phone]     Social Determinants of Health (SDOH) Interventions    Readmission Risk Interventions     No data to display

## 2022-02-06 NOTE — Progress Notes (Signed)
Discharge instructions discussed and pt and family verbalized understanding, Ivs, removed, NT to escort patient out of facility via wheelchair

## 2022-02-06 NOTE — Progress Notes (Signed)
  Transition of Care [facility]) Screening Note   Patient Details  Name: Jose [name] Date of Birth: 1956-01-15   Transition of Care [facility]) CM/SW Contact:    Mearl Latin, LCSW Phone Number: 02/06/2022, 8:50 AM    Transition of Care Department [facility]) has reviewed patient from 29M. We will continue to monitor patient advancement through interdisciplinary progression rounds. If new patient transition needs arise, please place a TOC consult.

## 2022-02-06 NOTE — TOC Transition Note (Signed)
Transition of Care [facility]) - CM/SW Discharge Note   Patient Details  Name: Alphonsa Brickle MRN: [id number] Date of Birth: April 21, 1955  Transition of Care [facility]) CM/SW Contact:  Kermit Balo, RN Phone Number: 02/06/2022, 2:55 PM   Clinical Narrative:    Pt discharging home with home health services.  Wife to provide transport home. She will bring [employer] for transport per pt.    Final next level of care: Home w Home Health Services Barriers to Discharge: No Barriers Identified   Patient Goals and CMS Choice Patient states their goals for this hospitalization and ongoing recovery are:: To return home CMS Medicare.gov Compare Post Acute Care list provided to:: Patient Choice offered to / list presented to : Patient  Discharge Placement                       Discharge Plan and Services   Discharge Planning Services: CM Consult Post Acute Care Choice: Home Health                    HH Arranged: PT, RN [facility] Agency: [facility] Date [facility] Agency Contacted: 02/06/22   Representative spoke with at [facility] Agency: Morrie Sheldon  Social Determinants of Health (SDOH) Interventions     Readmission Risk Interventions     No data to display

## 2022-02-06 NOTE — Progress Notes (Signed)
Physical Therapy Treatment Patient Details Name: Jose [name] MRN: [id number] DOB: 08-25-1955 Today's Date: 02/06/2022   History of Present Illness Pt is 66 year old presented to [name] on  02/02/22 after being found down at home after presumed fall/syncopal event. Agonal respirations and ?seizure actitivity. Intubated in ED. Self extubated 11/22. PMH - recurrent syncope, antiphospholipid syndrome, copd, PE, vertebrobasilar insufficiency, sleep apnea, cabg    PT Comments    Pt able to tolerate amb in hall without syncope. Pt reported minimal light headedness when asked. Sitting BP prior to amb 116/53 HR 72. BP after amb 134/60 HR 76. Pt plans to return home with wife able to provide assist.   Recommendations for follow up therapy are one component of a multi-disciplinary discharge planning process, led by the attending physician.  Recommendations may be updated based on patient status, additional functional criteria and insurance authorization.  Follow Up Recommendations  Home health PT     Assistance Recommended at Discharge Frequent or constant Supervision/Assistance  Patient can return home with the following Help with stairs or ramp for entrance;A little help with bathing/dressing/bathroom;A little help with walking and/or transfers;Assist for transportation   Equipment Recommendations  None recommended by PT    Recommendations for Other Services       Precautions / Restrictions Precautions Precautions: Fall;Other (comment) Precaution Comments: Recurrent syncope, orthostatic     Mobility  Bed Mobility               General bed mobility comments: Pt up in recliner    Transfers Overall transfer level: Needs assistance Equipment used: Rolling walker (2 wheels) Transfers: Sit to/from Stand Sit to Stand: Min guard           General transfer comment: Assist for safety and lines    Ambulation/Gait Ambulation/Gait assistance: Min guard, +2 safety/equipment (chair  follow due to history of syncope) Gait Distance (Feet): 80 Feet Assistive device: Rollator (4 wheels) Gait Pattern/deviations: Step-through pattern, Decreased stride length Gait velocity: decr Gait velocity interpretation: 1.31 - 2.62 ft/sec, indicative of limited community ambulator   General Gait Details: Assist for safety and lines.   Stairs             Wheelchair Mobility    Modified Rankin (Stroke Patients Only)       Balance Overall balance assessment: Needs assistance Sitting-balance support: No upper extremity supported, Feet supported Sitting balance-[name] Scale: Good     Standing balance support: Bilateral upper extremity supported, During functional activity Standing balance-[name] Scale: Poor Standing balance comment: Walker and min guard for static standing                            Cognition Arousal/Alertness: Awake/alert Behavior During Therapy: WFL for tasks assessed/performed Overall Cognitive Status: Impaired/Different from baseline Area of Impairment: Memory, Awareness, Problem solving                     Memory: Decreased short-term memory     Awareness: Emergent Problem Solving: Requires verbal cues          Exercises      General Comments General comments (skin integrity, edema, etc.): Pt on 4L O2 with SpO2 >95%. Sitting BP prior to amb 116/53. Sitting BP after amb 134/60. Asked pt if he was light headed he reported only a "little"      Pertinent Vitals/Pain Pain Assessment Pain Assessment: No/denies pain    Home Living  Prior Function            PT Goals (current goals can now be found in the care plan section) Acute Rehab PT Goals Patient Stated Goal: not stated Progress towards PT goals: Progressing toward goals    Frequency    Min 3X/week      PT Plan Current plan remains appropriate    Co-evaluation              AM-PAC PT "6 Clicks" Mobility    Outcome Measure  Help needed turning from your back to your side while in a flat bed without using bedrails?: A Little Help needed moving from lying on your back to sitting on the side of a flat bed without using bedrails?: A Little Help needed moving to and from a bed to a chair (including a wheelchair)?: A Little Help needed standing up from a chair using your arms (e.g., wheelchair or bedside chair)?: A Little Help needed to walk in hospital room?: A Little Help needed climbing 3-5 steps with a railing? : Total 6 Click Score: 16    End of Session Equipment Utilized During Treatment: Gait belt;Oxygen Activity Tolerance: Patient tolerated treatment well Patient left: in chair;with call bell/phone within reach;with chair alarm set Nurse Communication: Mobility status (Nurse followed with chair during amb) PT Visit Diagnosis: Unsteadiness on feet (R26.81);Other abnormalities of gait and mobility (R26.89);Difficulty in walking, not elsewhere classified (R26.2);Repeated falls (R29.6)     Time: 6945-0388 PT Time Calculation (min) (ACUTE ONLY): 22 min  Charges:  $Gait Training: 8-22 mins                     Jose [name] PT Acute Rehabilitation Services Office [phone]    Jose [name] Jose [name] 02/06/2022, 1:05 PM

## 2022-02-06 NOTE — Discharge Summary (Signed)
Jose [name] [id number] DOB: January 10, 1956 DOA: 02/02/2022  PCP: [name], MD  Admit date: 02/02/2022 Discharge date: 02/06/2022  Time spent: 35 minutes  Recommendations for Outpatient Follow-up:  Cardiology f/u, consider placement of event monitor     Discharge Diagnoses:  Principal Problem:   Altered mental status Active Problems:   Fall   Chronic respiratory failure with hypoxia and hypercapnia (HCC)   On mechanically assisted ventilation (HCC)   Acute respiratory failure with hypoxia (HCC)   COPD with acute exacerbation (HCC)   T2DM (type 2 diabetes mellitus) (HCC)   (HFpEF) heart failure with preserved ejection fraction (HCC)   OSA (obstructive sleep apnea)   CAD (coronary artery disease)   Hx of CABG   PAD (peripheral artery disease) (HCC)   Carotid artery stenosis   Aortic stenosis   Antiphospholipid syndrome (HCC)   History of maternal pulmonary embolus   Essential hypertension   Tobacco abuse   Obesity (BMI 30-39.9)   Recurrent syncope   Discharge Condition: stable  Diet recommendation: heart healthy  Filed Weights   02/03/22 0500 02/04/22 0600 02/05/22 0500  Weight: 108.6 kg 107.8 kg 107.8 kg    History of present illness:  From admission h and p Jose [name] is a 66 y.o. M with PMH of CAD s/p CABG, AVS, PVD, anti-phospholipid syndrome, COPD on 4-6L home O2 and hx non-compliance with trilogy, HL, PE and vertebrobasilar insufficiency causing recurrent syncope who was brought into the ED after being found face down at home after presumed fall.  He seemed to have seizure activity and agonal respirations so required intubation.  He was evaluated by Trauma and CT head/C-spine/abd and pelvis negative.  Labs showed creatinine 1.3, lactic acid 3.4, INR 1.9, trop 9, pH 7.2, pCO2 71.  He was initially unresponsive, but started following commands during ED course   Hospital Course:  Patient with a history of recurrent syncope presents after being found down  at home after a presumed syncopal event. Intubated on arrival, self-extubated on 11/22. Treated for aspiration pneumonia, tracheal aspirate grew mrsa so abx converted to linezolid and this was continued at discharge for a total of 7 days treatment. With regard to his recurrent syncope, neurology cwas consulted. Has had extensive outpatient w/u including ambulatory eeg, mris, ctas, tilt table test. Posterior circulation stenosis not thought to be significant enough to cause patient's symptoms. Patient reports increase in topomax helped symptoms in the past so neurology advised doing that at discharge. Doesn't appear patient has had an event monitor, so we advise close f/u with cardiology for consideration of placement of such if indeed that hasn't been done. Patient is on multiple meds that can contribute to syncope (metoprolol, gabapentin, tamsulosin, oxybutynin, alfuzosin, torsemide, trazodone). Advised trial of tapering off metoprolol, dose reduction in tamsulosin, discontinue trazodone, withoutpatient consideration of further dose reduction/elimination/substitution of above mentioned meds. Ambulated satisfactorily with PT, HH PT ordered.   Procedures: none   Consultations: neurology  Discharge Exam: Vitals:   02/06/22 0940 02/06/22 1142  BP:  (!) 106/56  Pulse: 78 75  Resp: 20 16  Temp:  97.9 F (36.6 C)  SpO2: 100% 98%    General: NAD Cardiovascular: RRR Respiratory: CTAB  Discharge Instructions   Discharge Instructions     Diet general   Complete by: As directed    Increase activity slowly   Complete by: As directed       Allergies as of 02/06/2022   No Known Allergies      Medication  List     STOP taking these medications    HYDROcodone-acetaminophen 5-325 MG tablet Commonly known as: NORCO/VICODIN   oxyBUTYnin 5 MG/5ML solution Commonly known as: DITROPAN   tiZANidine 4 MG tablet Commonly known as: ZANAFLEX   traZODone 50 MG tablet Commonly known as:  DESYREL       TAKE these medications    acetaminophen 500 MG tablet Commonly known as: TYLENOL Take 1,000 mg by mouth every 6 (six) hours as needed for mild pain.   albuterol 108 (90 Base) MCG/ACT inhaler Commonly known as: VENTOLIN HFA Inhale 2 puffs into the lungs every 6 (six) hours as needed for wheezing or shortness of breath.   alfuzosin 10 MG 24 hr tablet Commonly known as: UROXATRAL Take 10 mg by mouth daily with breakfast. Alfuzosin Hydrochloride Extend Release 10mg    aspirin EC 81 MG tablet Take 81 mg by mouth daily. Swallow whole.   DULoxetine 30 MG capsule Commonly known as: CYMBALTA Take 30 mg by mouth daily.   DULoxetine 60 MG capsule Commonly known as: CYMBALTA Take 60 mg by mouth daily.   enoxaparin 120 MG/0.8ML injection Commonly known as: LOVENOX Inject into the skin.   gabapentin 600 MG tablet Commonly known as: NEURONTIN Take 600 mg by mouth 2 (two) times daily.   glimepiride 4 MG tablet Commonly known as: AMARYL Take 4 mg by mouth 2 (two) times daily.   linezolid 600 MG tablet Commonly known as: ZYVOX Take 1 tablet (600 mg total) by mouth 2 (two) times daily.   metoprolol tartrate 25 MG tablet Commonly known as: LOPRESSOR Take 1 tab daily for for 4 days then 1 tab every other day for 4 days then stop What changed:  how much to take how to take this when to take this additional instructions   pantoprazole 40 MG tablet Commonly known as: PROTONIX Take 40 mg by mouth 2 (two) times daily.   rosuvastatin 10 MG tablet Commonly known as: CRESTOR Take 10 mg by mouth daily.   sildenafil 20 MG tablet Commonly known as: REVATIO Take 20 mg by mouth 3 (three) times daily.   tamsulosin 0.4 MG Caps capsule Commonly known as: FLOMAX Take 1 capsule (0.4 mg total) by mouth at bedtime. What changed: how much to take   topiramate 50 MG tablet Commonly known as: TOPAMAX Take 1.5 tablets (75 mg total) by mouth 2 (two) times daily. What  changed: how much to take   torsemide 20 MG tablet Commonly known as: DEMADEX Take 20 mg by mouth daily.   TRELEGY ELLIPTA IN Inhale 1 puff into the lungs daily.   warfarin 5 MG tablet Commonly known as: COUMADIN See admin instructions. Take (1mg  - 2mg ) by mouth in addition to (5 mg) as directed by the [facility].   warfarin 1 MG tablet Commonly known as: COUMADIN See admin instructions. Take one to two tablets (1-2mg ) by mouth as directed by the [facility] in addition to 5mg  tablets  (substituting Warfarin)       No Known Allergies  Follow-up Information     Corey Skains, MD Follow up.   Specialty: Cardiology Contact information: [address] [facility] [place] [address] [phone]                  The results of significant diagnostics from this hospitalization (including imaging, microbiology, ancillary and laboratory) are listed below for reference.    Significant Diagnostic Studies: DG CHEST PORT 1 VIEW  Result  Date: 02/03/2022 CLINICAL DATA:  Fever EXAM: PORTABLE CHEST 1 VIEW COMPARISON:  Previous studies including the examination of 02/02/2022 FINDINGS: Transverse diameter of heart is increased. There is previous coronary bypass surgery. Tip of endotracheal tube is 5.4 cm above the carina. Enteric tube is noted traversing the esophagus. Increased markings are seen in right lower lung fields. There is no focal consolidation. There is no pleural effusion or pneumothorax. IMPRESSION: Cardiomegaly. There are no signs of alveolar pulmonary edema. Increased interstitial markings are seen in right lower lung fields suggesting atelectasis/pneumonia. Electronically Signed   By: Elmer Picker M.D.   On: 02/03/2022 12:34   ECHOCARDIOGRAM COMPLETE  Result Date: 02/03/2022    ECHOCARDIOGRAM REPORT   Patient Name:   Jose [name] Date of Exam: 02/03/2022 Medical Rec #:  [id number]     Height:       70.0 in Accession  #:    [id number]    Weight:       239.4 lb Date of Birth:  October 31, 1955      BSA:          2.253 m Patient Age:    66 years      BP:           92/51 mmHg Patient Gender: M             HR:           86 bpm. Exam Location:  Inpatient Procedure: 2D Echo, Cardiac Doppler, Color Doppler and Intracardiac            Opacification Agent Indications:    Syncope R55  History:        Patient has no prior history of Echocardiogram examinations.                 CAD, Prior CABG, COPD and PAD, Aortic Valve Disease; Risk                 Factors:Current Smoker. 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  Sonographer:    Darlina Sicilian RDCS Referring Phys: [id number] Francesca Jewett  Sonographer Comments: Suboptimal apical window, suboptimal parasternal window, suboptimal subcostal window, echo performed with patient supine and on artificial respirator and Technically difficult study due to poor echo windows. IMPRESSIONS  1. Peak gradient of 33 mmHg despite mildly decrease LV stroke volume. DVI 0.26. Gradients were obtained ~ 1 minute after contrast administration with no contrast in the LV cavity. 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 The aortic valve is calcified. There is moderate calcification of the aortic valve. There is moderate thickening of the aortic valve. Aortic valve regurgitation is mild. Severe aortic valve stenosis. Aortic valve area, by VTI measures 0.80 cm. Aortic valve mean gradient measures 33.0 mmHg. Aortic valve Vmax measures 3.66 m/s.  2. Left ventricular ejection fraction, by estimation, is 55 to 60%. The left ventricle has normal function. The left ventricle has no regional wall motion abnormalities. The left ventricular internal cavity size was mildly dilated. Left ventricular diastolic parameters are indeterminate.  3. Right ventricular systolic function was not well visualized. The right ventricular size is not well visualized.  4. The mitral  valve is grossly normal. No evidence of mitral valve regurgitation. No evidence of mitral stenosis.  5. The inferior vena cava is dilated in size with >50% respiratory variability, suggesting right atrial pressure of 8 mmHg. Comparison(s): No prior  Echocardiogram. FINDINGS  Left Ventricle: Left ventricular ejection fraction, by estimation, is 55 to 60%. The left ventricle has normal function. The left ventricle has no regional wall motion abnormalities. Definity contrast agent was given IV to delineate the left ventricular  endocardial borders. The left ventricular internal cavity size was mildly dilated. There is no left ventricular hypertrophy. Left ventricular diastolic parameters are indeterminate. Right Ventricle: The right ventricular size is not well visualized. Right vetricular wall thickness was not well visualized. Right ventricular systolic function was not well visualized. Left Atrium: Left atrial size was normal in size. Right Atrium: Right atrial size was not well visualized. Pericardium: Trivial pericardial effusion is present. Mitral Valve: The mitral valve is grossly normal. No evidence of mitral valve regurgitation. No evidence of mitral valve stenosis. Tricuspid Valve: The tricuspid valve is not well visualized. Tricuspid valve regurgitation is not demonstrated. No evidence of tricuspid stenosis. Aortic Valve: Peak gradient of 33 mmHg despite mildly decrease LV stroke volume. DVI 0.26. Gradients were obtained ~ 1 minute after contrast administration with no contrast in the LV cavity. 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 The aortic valve is calcified. There is moderate calcification of the aortic valve. There is moderate thickening of the aortic valve. There is moderate aortic valve annular calcification. Aortic valve regurgitation is mild. Severe aortic stenosis is present. Aortic valve mean gradient measures 33.0 mmHg. Aortic valve peak gradient measures 53.6 mmHg.  Aortic valve area, by VTI measures 0.80 cm. Pulmonic Valve: The pulmonic valve was not well visualized. Pulmonic valve regurgitation is trivial. No evidence of pulmonic stenosis. Aorta: The aortic root is normal in size and structure. Venous: The inferior vena cava is dilated in size with greater than 50% respiratory variability, suggesting right atrial pressure of 8 mmHg. IAS/Shunts: No atrial level shunt detected by color flow Doppler.  LEFT VENTRICLE PLAX 2D LVIDd:         5.98 cm      Diastology LVIDs:         4.99 cm      LV e' medial:    4.62 cm/s LV PW:         0.85 cm      LV E/e' medial:  23.4 LV IVS:        0.91 cm      LV e' lateral:   6.16 cm/s LVOT diam:     2.00 cm      LV E/e' lateral: 17.5 LV SV:         68 LV SV Index:   30 LVOT Area:     3.14 cm  LV Volumes (MOD) LV vol d, MOD A2C: 164.0 ml LV vol d, MOD A4C: 186.0 ml LV vol s, MOD A2C: 69.9 ml LV vol s, MOD A4C: 78.3 ml LV SV MOD A2C:     94.1 ml LV SV MOD A4C:     186.0 ml LV SV MOD BP:      101.4 ml LEFT ATRIUM             Index LA diam:        3.70 cm 1.64 cm/m LA Vol (A2C):   48.6 ml 21.57 ml/m LA Vol (A4C):   57.9 ml 25.70 ml/m LA Biplane Vol: 56.5 ml 25.08 ml/m  AORTIC VALVE AV Area (Vmax):    0.94 cm AV Area (Vmean):   0.97 cm AV Area (VTI):     0.80 cm AV Vmax:  366.00 cm/s AV Vmean:          263.500 cm/s AV VTI:            0.840 m AV Peak Grad:      53.6 mmHg AV Mean Grad:      33.0 mmHg LVOT Vmax:         110.00 cm/s LVOT Vmean:        81.700 cm/s LVOT VTI:          0.215 m LVOT/AV VTI ratio: 0.26  AORTA Ao Root diam: 2.60 cm MITRAL VALVE MV Area (PHT): 4.26 cm     SHUNTS MV Decel Time: 178 msec     Systemic VTI:  0.22 m MV E velocity: 108.00 cm/s  Systemic Diam: 2.00 cm MV A velocity: 90.30 cm/s MV E/A ratio:  1.20 Rudean Haskell MD Electronically signed by Rudean Haskell MD Signature Date/Time: 02/03/2022/10:25:21 AM    Final    Overnight EEG with video  Result Date: 02/03/2022 Lora Havens, MD      02/04/2022  8:52 AM Patient Name: Jose [name] MRN: [id number] Epilepsy Attending: Lora Havens Referring Physician/Provider: Julian Hy, DO Duration: 02/02/2022 1055 to 02/03/2022 1055  Patient history: 66 year old male with altered mental status.  EEG to evaluate for seizure.  Level of alertness: comatose  AEDs during EEG study: GBP, TPM, Propofol  Technical aspects: This EEG study was done with scalp electrodes positioned according to the 10-20 International system of electrode placement. Electrical activity was reviewed with band pass filter of 1-70Hz , sensitivity of 7 uV/mm, display speed of 46mm/sec with a 60Hz  notched filter applied as appropriate. EEG data were recorded continuously and digitally stored.  Video monitoring was available and reviewed as appropriate.  Description: EEG showed continuous generalized 3 to 6 Hz theta-delta slowing admixed with intermittent generalized 15 to 18 Hz beta activity. Hyperventilation and photic stimulation were not performed.    ABNORMALITY - Continuous slow, generalized  IMPRESSION: This study is suggestive of severe diffuse encephalopathy, nonspecific etiology but likely due to sedation. No seizures or epileptiform discharges were seen throughout the recording.  Lora Havens    CT ANGIO HEAD NECK W WO CM  Addendum Date: 02/03/2022   ADDENDUM REPORT: 02/03/2022 05:50 ADDENDUM: #4 was Study discussed by telephone with ICU Nurse Brittney at 0528 hours on 02/03/2022. Electronically Signed   By: Genevie Ann M.D.   On: 02/03/2022 05:50   Result Date: 02/03/2022 CLINICAL DATA:  66 year old male status post fall, seizure. Intubated. Unrevealing brain MRI. EXAM: CT ANGIOGRAPHY HEAD AND NECK TECHNIQUE: Multidetector CT imaging of the head and neck was performed using the standard protocol during bolus administration of intravenous contrast. Multiplanar CT image reconstructions and MIPs were obtained to evaluate the vascular anatomy. Carotid stenosis  measurements (when applicable) are obtained utilizing NASCET criteria, using the distal internal carotid diameter as the denominator. RADIATION DOSE REDUCTION: This exam was performed according to the departmental dose-optimization program which includes automated exposure control, adjustment of the mA and/or kV according to patient size and/or use of iterative reconstruction technique. CONTRAST:  78mL OMNIPAQUE IOHEXOL 350 MG/ML SOLN COMPARISON:  02/02/2022 brain MRI, head and cervical spine CT, CT Chest, Abdomen, and Pelvis. FINDINGS: CT HEAD Brain: Mild EEG lead artifact now. Stable non contrast CT appearance of the brain. No acute or evolving infarct identified. No acute intracranial hemorrhage identified. No intracranial mass effect. Calvarium and skull base: Stable and intact. Paranasal sinuses: Remains intubated. Scattered paranasal sinus fluid and  mucosal thickening is stable. Tympanic cavities and mastoids remain clear. Orbits: No acute orbit or scalp soft tissue finding. EEG leads are in place. CTA NECK Skeleton: Carious dentition. Spine degeneration. Previous sternotomy. No acute osseous abnormality identified. Upper chest: Prior CABG. Endotracheal tube terminates at the carina. Enteric tube courses into the esophagus. Visible mediastinum appears stable from a chest CTA 07/13/2020, with occasional maximal nonspecific mediastinal lymph nodes (right paratracheal series 8, image 164). Upper lungs remain clear. Other neck: Intubated and oral enteric tube in place with fluid in the pharynx. No superimposed acute neck soft tissue finding identified. Aortic arch: Prior CABG. Calcified aortic atherosclerosis. 3 vessel arch configuration. Right carotid system: Brachiocephalic soft and calcified plaque without stenosis. Negative right CCA origin. No right CCA plaque before the bifurcation. Soft more than calcified plaque at the right ICA origin with less than 50 % stenosis with respect to the distal vessel. Left  carotid system: Mild left CCA origin plaque without stenosis. Calcified plaque at the left carotid bifurcation and ICA origin with less than 50 % stenosis with respect to the distal vessel. Vertebral arteries: Proximal right subclavian artery soft and calcified plaque without stenosis. Normal right vertebral artery origin. Right vertebral artery appears codominant and normal to the skull base. Proximal left subclavian artery soft and calcified plaque with no significant stenosis. Plaque abuts the left vertebral artery origin with only mild origin stenosis on series 14, image 125. Superimposed left V1 calcified plaque on series 13, image 298 with only mild stenosis. Otherwise codominant and normal left vertebral artery to the skull base. CTA HEAD Posterior circulation: Mild left vertebral V4 calcified plaque without stenosis. Left PICA origin remains patent. Bulky right V4 calcified plaque and moderate to severe right V4 stenosis (series 13, image 155 and series 14, image 114) but the distal right vertebral remains patent. Patent vertebrobasilar junction without stenosis. Right AICA appears dominant and patent. Patent mildly tortuous basilar artery without stenosis. Normal SCA and PCA origins. Small posterior communicating arteries. Bilateral PCA branches are within normal limits. Anterior circulation: Both ICA siphons are patent. Tortuous left siphon cavernous segment with up to moderate calcified plaque and mild stenosis. Similar right siphon cavernous segment tortuosity and moderate calcified plaque but only mild right siphon stenosis. Patent carotid termini. Patent MCA and ACA origins. Dominant left A1, the right A1 is diminutive. Anterior communicating artery and bilateral ACA branches appear patent without stenosis. Ectatic bilateral ACA/pericallosal vessel branching (series 18, image 20), but no discrete ACA aneurysm. Left MCA M1 segment and bifurcation are patent without stenosis. Right MCA M1 segment and  bifurcation are patent without stenosis. Bilateral MCA branches are within normal limits. Venous sinuses: Patent. Anatomic variants: Dominant left and diminutive right ACA A1 segments. Review of the MIP images confirms the above findings IMPRESSION: 1. Negative for large vessel occlusion. Positive for bulky calcified plaque at the Right Vertebral Artery V4 segment with moderate to severe stenosis. But the vessel remains patent. 2. No other significant arterial stenosis in the head or neck. Bilateral carotid bifurcation, bilateral cavernous ICA, and left vertebral artery origin plaque with only mild stenosis. 3. Stable CT appearance of the brain. No acute intracranial abnormality identified. 4. Endotracheal tube terminates at the carina, retract 1 cm for more optimal placement. Enteric tube courses into the esophagus. 5. Prior CABG. Aortic Atherosclerosis (ICD10-I70.0). Carious dentition. Electronically Signed: By: Genevie Ann M.D. On: 02/03/2022 05:14   MR BRAIN W WO CONTRAST  Result Date: 02/02/2022 CLINICAL DATA:  Seizure, new-onset,  history of trauma EXAM: MRI HEAD WITHOUT AND WITH CONTRAST TECHNIQUE: Multiplanar, multiecho pulse sequences of the brain and surrounding structures were obtained without and with intravenous contrast. CONTRAST:  41mL GADAVIST GADOBUTROL 1 MMOL/ML IV SOLN COMPARISON:  CT head from the same day.  MRI head January 28, 2021. FINDINGS: Brain: No acute infarction, hemorrhage, hydrocephalus, extra-axial collection or mass lesion. Hippocampi appear grossly within normal limits and symmetric, although evaluation is limited due to motion on the coronal sequences. No pathologic enhancement. Vascular: Major arterial flow voids are maintained at the skull base. Skull and upper cervical spine: Normal marrow signal. Sinuses/Orbits: Paranasal sinus mucosal thickening with air-fluid levels. No acute orbital findings. Other: No mastoid effusions. IMPRESSION: No evidence of acute intracranial  abnormality. Electronically Signed   By: Margaretha Sheffield M.D.   On: 02/02/2022 15:21   EEG adult  Result Date: 02/02/2022 Lora Havens, MD     02/02/2022  1:46 PM Patient Name: Jose [name] MRN: [id number] Epilepsy Attending: Lora Havens Referring Physician/Provider: Gleason, Otilio Carpen, PA-C Date: 02/02/2022 Duration: 23.36 mins Patient history: 66 year old male with altered mental status.  EEG to evaluate for seizure. Level of alertness: comatose AEDs during EEG study: GBP, Propofol Technical aspects: This EEG study was done with scalp electrodes positioned according to the 10-20 International system of electrode placement. Electrical activity was reviewed with band pass filter of 1-70Hz , sensitivity of 7 uV/mm, display speed of 37mm/sec with a 60Hz  notched filter applied as appropriate. EEG data were recorded continuously and digitally stored.  Video monitoring was available and reviewed as appropriate. Description: EEG showed continuous generalized 3 to 6 Hz theta-delta slowing admixed with 15 to 18 Hz beta activity distributed symmetrically and diffusely. Hyperventilation and photic stimulation were not performed.   ABNORMALITY - Continuous slow, generalized IMPRESSION: This study is suggestive of severe diffuse encephalopathy, nonspecific etiology. No seizures or epileptiform discharges were seen throughout the recording. Lora Havens   DG Abd Portable 1V  Result Date: 02/02/2022 CLINICAL DATA:  Orogastric tube placement. EXAM: PORTABLE ABDOMEN - 1 VIEW COMPARISON:  None Available. FINDINGS: Distal tip of nasogastric tube is seen in expected position of distal stomach. IMPRESSION: Distal tip of nasogastric tube is seen in expected position of distal stomach. Electronically Signed   By: Marijo Conception M.D.   On: 02/02/2022 09:36   CT HEAD WO CONTRAST  Result Date: 02/02/2022 CLINICAL DATA:  Fall injury.  Level 1 trauma. EXAM: CT HEAD WITHOUT CONTRAST CT CERVICAL SPINE WITHOUT  CONTRAST CT CHEST, ABDOMEN AND PELVIS WITH CONTRAST TECHNIQUE: Contiguous axial images were obtained from the base of the skull through the vertex without intravenous contrast. Multidetector CT imaging of the cervical spine was performed without intravenous contrast. Multiplanar CT image reconstructions were also generated. Multidetector CT imaging of the chest, abdomen and pelvis was performed following the standard protocol during bolus administration of intravenous contrast. RADIATION DOSE REDUCTION: This exam was performed according to the departmental dose-optimization program which includes automated exposure control, adjustment of the mA and/or kV according to patient size and/or use of iterative reconstruction technique. CONTRAST:  28mL OMNIPAQUE IOHEXOL 350 MG/ML SOLN COMPARISON:  Head CT and cervical spine CT both 07/07/2021, CTA of abdomen and pelvis recently 01/22/2022, and CTA chest 07/13/2020. FINDINGS: CT HEAD FINDINGS Brain: There is mild global atrophy, mild small-vessel disease of the cerebral white matter. The ventricles are normal in size and position. No new asymmetry is seen worrisome for an acute hemorrhage, cortical based acute infarct, or  mass. Vascular: There calcifications in the distal vertebral arteries and siphons but no hyperdense central vessel. Skull: Negative for fractures or focal lesions. I see no focal scalp hematoma. Sinuses/Orbits: Prominent bilateral proptosis with proliferated intraorbital fat, but without extraocular muscle thickening. This was seen previously. There is partial opacification of the ethmoid air cells. Trace membrane thickening in the maxillary sinuses. Other visualized sinuses, bilateral mastoid air cells and middle ears are clear. Other: None. CT CERVICAL FINDINGS Alignment: Normal. Skull base and vertebrae: The vertebra are normally mineralized. No fracture is evident. Joint space loss and spurring is again noted of the anterior atlantodental joint with the  C1 lateral masses normally positioned on C2. Soft tissues and spinal canal: No prevertebral fluid or swelling. No visible canal hematoma. There calcifications in the left-greater-than-right proximal cervical ICAs. Disc levels: There is mild disc space loss at C4-5 and moderate to advanced disc space loss C5-6 and C6-7, mild-to-moderate disc space loss C7-T1, unchanged. There are anterior osteophytes without significant posterior osteophytes at C2-3, C3-4 and C4-5. At C5-6 and C6-7 there are bidirectional osteophytes posterior disc osteophyte complexes encroaching on the ventral cord surface to left-greater-than-right. Other levels do not show significant soft tissue or bony encroachment on the thecal sac. Facet joint and uncinate hypertrophy is seen at most levels. Acquired foraminal stenosis is mild on the right at C2-3, moderate on the left at C3-4, bilaterally moderate to severe C5-6, and moderate to severe on the left at C6-7. Other:  None. CT CHEST FINDINGS Cardiovascular: There is mild cardiomegaly, CABG change and three-vessel calcific CAD. There is no pericardial effusion. The pulmonary arteries and veins are normal caliber. The pulmonary arteries are centrally clear. The aorta is normal caliber without stenosis, aneurysm or dissection with mild patchy calcific plaques, and moderate calcification in the aortic valve leaflets. There are scattered calcifications in the great vessels without stenosis. Mediastinum/Nodes: No enlarged mediastinal, hilar, or axillary lymph nodes. Thyroid gland and thoracic esophagus demonstrate no significant findings. There are retained secretions in the distal trachea and ETT in place with tip 2.5 cm from the carina. The main bronchi are clear. Lungs/Pleura: Again noted scattered linear scarring or atelectasis in the lung bases, predominantly in the lower lobes. No infiltrate, contusion or nodule are seen. No pleural effusion, thickening or pneumothorax. Musculoskeletal: There  are degenerative changes of the spine as well as multilevel bridging enthesopathy. There is no spinal compression injury. No acute regional skeletal abnormality is visible. The ribcage is intact. CT ABDOMEN PELVIS FINDINGS Hepatobiliary: No hepatic injury or perihepatic hematoma. Gallbladder is unremarkable the liver is 20 cm length with mild general steatosis without mass. There is no biliary dilatation. Pancreas: Partially atrophic.  Otherwise unremarkable. Spleen: No splenic injury or perisplenic hematoma. Adrenals/Urinary Tract: There is no adrenal mass or hemorrhage. There is no renal mass enhancement or hemorrhage. Bilateral nonobstructive nephrolithiasis. Largest stone is 1 cm on the right. Perinephric stranding is unchanged. There is no hydronephrosis, ureteral stone or bladder thickening. Stomach/Bowel: The stomach and unopacified small bowel are unremarkable. An appendix is not seen. There is mild wall thickening versus underdistention in the ascending colon. There is mild fecal stasis and colonic interposition of the hepatic flexure in front of the liver. Scattered sigmoid diverticula without evidence of diverticulitis. Vascular/Lymphatic: Aortic atherosclerosis. No enlarged abdominal or pelvic lymph nodes. Stable asymmetry of ectasia in the left common iliac artery which measures 1.7 cm. Reproductive: No prostatomegaly. Other: Right gluteal subcutaneous hematoma measuring 9.3 x 3.8 cm on 4:98, at  a similar level previously having measured 9.1 x 3.6 cm therefore only slightly larger. No active hemorrhaging vessel is seen within the collection. Stranding in the bilateral lateral upper thighs is again also noted greater on the right. There is no free fluid, free air, free hemorrhage or incarcerated hernia. Musculoskeletal: There is a mild superior endplate anterior wedge compression fracture of the L1 vertebral body, but this was seen previously. There is mild osteopenia and advanced degenerative change of  the lumbar spine. No acute regional osseous abnormality is seen. Ankylosis across the anterior right SI joint. Multilevel lumbar foraminal stenosis. IMPRESSION: 1. No acute intracranial CT findings or depressed skull fractures. Atrophy and small-vessel disease. 2. Bilateral proptosis with proliferated intraorbital fat, but without extraocular muscle thickening. This was seen previously and could be due to obesity, chronic corticosteroid use, thyroid ophthalmopathy or Graves' disease. 3. Cervical degenerative changes without evidence of fractures or listhesis. 4. 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 Aortic and coronary artery atherosclerosis. No findings of acute CHF. 5. Intubated, with retained secretions in the distal trachea. 6. No acute trauma related findings in the chest, abdomen or pelvis. 7. Right gluteal subcutaneous hematoma measuring 9.3 x 3.8 cm, only slightly larger than on 06/22/2021. No active hemorrhaging vessel is seen within the hematoma. 8. Ascending colitis versus nondistention. Constipation and diverticulosis. 9. Nonobstructive nephrolithiasis. 10. Osteopenia and degenerative change without acute regional skeletal abnormality. 11. Results were called to Dr. Chelsea Primus at 4:39 a.m., 02/02/2022 immediately after review of available images. Aortic Atherosclerosis (ICD10-I70.0). Electronically Signed   By: Telford Nab M.D.   On: 02/02/2022 05:37   CT CERVICAL SPINE WO CONTRAST  Result Date: 02/02/2022 CLINICAL DATA:  Fall injury.  Level 1 trauma. EXAM: CT HEAD WITHOUT CONTRAST CT CERVICAL SPINE WITHOUT CONTRAST CT CHEST, ABDOMEN AND PELVIS WITH CONTRAST TECHNIQUE: Contiguous axial images were obtained from the base of the skull through the vertex without intravenous contrast. Multidetector CT imaging of the cervical spine was performed without intravenous contrast. Multiplanar CT image reconstructions were also generated. Multidetector CT imaging of the chest, abdomen and pelvis  was performed following the standard protocol during bolus administration of intravenous contrast. RADIATION DOSE REDUCTION: This exam was performed according to the departmental dose-optimization program which includes automated exposure control, adjustment of the mA and/or kV according to patient size and/or use of iterative reconstruction technique. CONTRAST:  2mL OMNIPAQUE IOHEXOL 350 MG/ML SOLN COMPARISON:  Head CT and cervical spine CT both 07/07/2021, CTA of abdomen and pelvis recently 01/22/2022, and CTA chest 07/13/2020. FINDINGS: CT HEAD FINDINGS Brain: There is mild global atrophy, mild small-vessel disease of the cerebral white matter. The ventricles are normal in size and position. No new asymmetry is seen worrisome for an acute hemorrhage, cortical based acute infarct, or mass. Vascular: There calcifications in the distal vertebral arteries and siphons but no hyperdense central vessel. Skull: Negative for fractures or focal lesions. I see no focal scalp hematoma. Sinuses/Orbits: Prominent bilateral proptosis with proliferated intraorbital fat, but without extraocular muscle thickening. This was seen previously. There is partial opacification of the ethmoid air cells. Trace membrane thickening in the maxillary sinuses. Other visualized sinuses, bilateral mastoid air cells and middle ears are clear. Other: None. CT CERVICAL FINDINGS Alignment: Normal. Skull base and vertebrae: The vertebra are normally mineralized. No fracture is evident. Joint space loss and spurring is again noted of the anterior atlantodental joint with the C1 lateral masses normally positioned on C2. Soft tissues and spinal canal: No prevertebral fluid  or swelling. No visible canal hematoma. There calcifications in the left-greater-than-right proximal cervical ICAs. Disc levels: There is mild disc space loss at C4-5 and moderate to advanced disc space loss C5-6 and C6-7, mild-to-moderate disc space loss C7-T1, unchanged. There are  anterior osteophytes without significant posterior osteophytes at C2-3, C3-4 and C4-5. At C5-6 and C6-7 there are bidirectional osteophytes posterior disc osteophyte complexes encroaching on the ventral cord surface to left-greater-than-right. Other levels do not show significant soft tissue or bony encroachment on the thecal sac. Facet joint and uncinate hypertrophy is seen at most levels. Acquired foraminal stenosis is mild on the right at C2-3, moderate on the left at C3-4, bilaterally moderate to severe C5-6, and moderate to severe on the left at C6-7. Other:  None. CT CHEST FINDINGS Cardiovascular: There is mild cardiomegaly, CABG change and three-vessel calcific CAD. There is no pericardial effusion. The pulmonary arteries and veins are normal caliber. The pulmonary arteries are centrally clear. The aorta is normal caliber without stenosis, aneurysm or dissection with mild patchy calcific plaques, and moderate calcification in the aortic valve leaflets. There are scattered calcifications in the great vessels without stenosis. Mediastinum/Nodes: No enlarged mediastinal, hilar, or axillary lymph nodes. Thyroid gland and thoracic esophagus demonstrate no significant findings. There are retained secretions in the distal trachea and ETT in place with tip 2.5 cm from the carina. The main bronchi are clear. Lungs/Pleura: Again noted scattered linear scarring or atelectasis in the lung bases, predominantly in the lower lobes. No infiltrate, contusion or nodule are seen. No pleural effusion, thickening or pneumothorax. Musculoskeletal: There are degenerative changes of the spine as well as multilevel bridging enthesopathy. There is no spinal compression injury. No acute regional skeletal abnormality is visible. The ribcage is intact. CT ABDOMEN PELVIS FINDINGS Hepatobiliary: No hepatic injury or perihepatic hematoma. Gallbladder is unremarkable the liver is 20 cm length with mild general steatosis without mass. There  is no biliary dilatation. Pancreas: Partially atrophic.  Otherwise unremarkable. Spleen: No splenic injury or perisplenic hematoma. Adrenals/Urinary Tract: There is no adrenal mass or hemorrhage. There is no renal mass enhancement or hemorrhage. Bilateral nonobstructive nephrolithiasis. Largest stone is 1 cm on the right. Perinephric stranding is unchanged. There is no hydronephrosis, ureteral stone or bladder thickening. Stomach/Bowel: The stomach and unopacified small bowel are unremarkable. An appendix is not seen. There is mild wall thickening versus underdistention in the ascending colon. There is mild fecal stasis and colonic interposition of the hepatic flexure in front of the liver. Scattered sigmoid diverticula without evidence of diverticulitis. Vascular/Lymphatic: Aortic atherosclerosis. No enlarged abdominal or pelvic lymph nodes. Stable asymmetry of ectasia in the left common iliac artery which measures 1.7 cm. Reproductive: No prostatomegaly. Other: Right gluteal subcutaneous hematoma measuring 9.3 x 3.8 cm on 4:98, at a similar level previously having measured 9.1 x 3.6 cm therefore only slightly larger. No active hemorrhaging vessel is seen within the collection. Stranding in the bilateral lateral upper thighs is again also noted greater on the right. There is no free fluid, free air, free hemorrhage or incarcerated hernia. Musculoskeletal: There is a mild superior endplate anterior wedge compression fracture of the L1 vertebral body, but this was seen previously. There is mild osteopenia and advanced degenerative change of the lumbar spine. No acute regional osseous abnormality is seen. Ankylosis across the anterior right SI joint. Multilevel lumbar foraminal stenosis. IMPRESSION: 1. No acute intracranial CT findings or depressed skull fractures. Atrophy and small-vessel disease. 2. Bilateral proptosis with proliferated intraorbital fat, but  without extraocular muscle thickening. This was seen  previously and could be due to obesity, chronic corticosteroid use, thyroid ophthalmopathy or Graves' disease. 3. Cervical degenerative changes without evidence of fractures or listhesis. 4. 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 Aortic and coronary artery atherosclerosis. No findings of acute CHF. 5. Intubated, with retained secretions in the distal trachea. 6. No acute trauma related findings in the chest, abdomen or pelvis. 7. Right gluteal subcutaneous hematoma measuring 9.3 x 3.8 cm, only slightly larger than on 06/22/2021. No active hemorrhaging vessel is seen within the hematoma. 8. Ascending colitis versus nondistention. Constipation and diverticulosis. 9. Nonobstructive nephrolithiasis. 10. Osteopenia and degenerative change without acute regional skeletal abnormality. 11. Results were called to Dr. Junius Finner at 4:39 a.m., 02/02/2022 immediately after review of available images. Aortic Atherosclerosis (ICD10-I70.0). Electronically Signed   By: Almira Bar M.D.   On: 02/02/2022 05:37   CT CHEST ABDOMEN PELVIS W CONTRAST  Result Date: 02/02/2022 CLINICAL DATA:  Fall injury.  Level 1 trauma. EXAM: CT HEAD WITHOUT CONTRAST CT CERVICAL SPINE WITHOUT CONTRAST CT CHEST, ABDOMEN AND PELVIS WITH CONTRAST TECHNIQUE: Contiguous axial images were obtained from the base of the skull through the vertex without intravenous contrast. Multidetector CT imaging of the cervical spine was performed without intravenous contrast. Multiplanar CT image reconstructions were also generated. Multidetector CT imaging of the chest, abdomen and pelvis was performed following the standard protocol during bolus administration of intravenous contrast. RADIATION DOSE REDUCTION: This exam was performed according to the departmental dose-optimization program which includes automated exposure control, adjustment of the mA and/or kV according to patient size and/or use of iterative reconstruction technique. CONTRAST:  33mL  OMNIPAQUE IOHEXOL 350 MG/ML SOLN COMPARISON:  Head CT and cervical spine CT both 07/07/2021, CTA of abdomen and pelvis recently 01/22/2022, and CTA chest 07/13/2020. FINDINGS: CT HEAD FINDINGS Brain: There is mild global atrophy, mild small-vessel disease of the cerebral white matter. The ventricles are normal in size and position. No new asymmetry is seen worrisome for an acute hemorrhage, cortical based acute infarct, or mass. Vascular: There calcifications in the distal vertebral arteries and siphons but no hyperdense central vessel. Skull: Negative for fractures or focal lesions. I see no focal scalp hematoma. Sinuses/Orbits: Prominent bilateral proptosis with proliferated intraorbital fat, but without extraocular muscle thickening. This was seen previously. There is partial opacification of the ethmoid air cells. Trace membrane thickening in the maxillary sinuses. Other visualized sinuses, bilateral mastoid air cells and middle ears are clear. Other: None. CT CERVICAL FINDINGS Alignment: Normal. Skull base and vertebrae: The vertebra are normally mineralized. No fracture is evident. Joint space loss and spurring is again noted of the anterior atlantodental joint with the C1 lateral masses normally positioned on C2. Soft tissues and spinal canal: No prevertebral fluid or swelling. No visible canal hematoma. There calcifications in the left-greater-than-right proximal cervical ICAs. Disc levels: There is mild disc space loss at C4-5 and moderate to advanced disc space loss C5-6 and C6-7, mild-to-moderate disc space loss C7-T1, unchanged. There are anterior osteophytes without significant posterior osteophytes at C2-3, C3-4 and C4-5. At C5-6 and C6-7 there are bidirectional osteophytes posterior disc osteophyte complexes encroaching on the ventral cord surface to left-greater-than-right. Other levels do not show significant soft tissue or bony encroachment on the thecal sac. Facet joint and uncinate hypertrophy is  seen at most levels. Acquired foraminal stenosis is mild on the right at C2-3, moderate on the left at C3-4, bilaterally moderate to severe C5-6, and moderate to  severe on the left at C6-7. Other:  None. CT CHEST FINDINGS Cardiovascular: There is mild cardiomegaly, CABG change and three-vessel calcific CAD. There is no pericardial effusion. The pulmonary arteries and veins are normal caliber. The pulmonary arteries are centrally clear. The aorta is normal caliber without stenosis, aneurysm or dissection with mild patchy calcific plaques, and moderate calcification in the aortic valve leaflets. There are scattered calcifications in the great vessels without stenosis. Mediastinum/Nodes: No enlarged mediastinal, hilar, or axillary lymph nodes. Thyroid gland and thoracic esophagus demonstrate no significant findings. There are retained secretions in the distal trachea and ETT in place with tip 2.5 cm from the carina. The main bronchi are clear. Lungs/Pleura: Again noted scattered linear scarring or atelectasis in the lung bases, predominantly in the lower lobes. No infiltrate, contusion or nodule are seen. No pleural effusion, thickening or pneumothorax. Musculoskeletal: There are degenerative changes of the spine as well as multilevel bridging enthesopathy. There is no spinal compression injury. No acute regional skeletal abnormality is visible. The ribcage is intact. CT ABDOMEN PELVIS FINDINGS Hepatobiliary: No hepatic injury or perihepatic hematoma. Gallbladder is unremarkable the liver is 20 cm length with mild general steatosis without mass. There is no biliary dilatation. Pancreas: Partially atrophic.  Otherwise unremarkable. Spleen: No splenic injury or perisplenic hematoma. Adrenals/Urinary Tract: There is no adrenal mass or hemorrhage. There is no renal mass enhancement or hemorrhage. Bilateral nonobstructive nephrolithiasis. Largest stone is 1 cm on the right. Perinephric stranding is unchanged. There is no  hydronephrosis, ureteral stone or bladder thickening. Stomach/Bowel: The stomach and unopacified small bowel are unremarkable. An appendix is not seen. There is mild wall thickening versus underdistention in the ascending colon. There is mild fecal stasis and colonic interposition of the hepatic flexure in front of the liver. Scattered sigmoid diverticula without evidence of diverticulitis. Vascular/Lymphatic: Aortic atherosclerosis. No enlarged abdominal or pelvic lymph nodes. Stable asymmetry of ectasia in the left common iliac artery which measures 1.7 cm. Reproductive: No prostatomegaly. Other: Right gluteal subcutaneous hematoma measuring 9.3 x 3.8 cm on 4:98, at a similar level previously having measured 9.1 x 3.6 cm therefore only slightly larger. No active hemorrhaging vessel is seen within the collection. Stranding in the bilateral lateral upper thighs is again also noted greater on the right. There is no free fluid, free air, free hemorrhage or incarcerated hernia. Musculoskeletal: There is a mild superior endplate anterior wedge compression fracture of the L1 vertebral body, but this was seen previously. There is mild osteopenia and advanced degenerative change of the lumbar spine. No acute regional osseous abnormality is seen. Ankylosis across the anterior right SI joint. Multilevel lumbar foraminal stenosis. IMPRESSION: 1. No acute intracranial CT findings or depressed skull fractures. Atrophy and small-vessel disease. 2. Bilateral proptosis with proliferated intraorbital fat, but without extraocular muscle thickening. This was seen previously and could be due to obesity, chronic corticosteroid use, thyroid ophthalmopathy or Graves' disease. 3. Cervical degenerative changes without evidence of fractures or listhesis. 4. 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 Aortic and coronary artery atherosclerosis. No findings of acute CHF. 5. Intubated, with retained secretions in the distal trachea. 6. No  acute trauma related findings in the chest, abdomen or pelvis. 7. Right gluteal subcutaneous hematoma measuring 9.3 x 3.8 cm, only slightly larger than on 06/22/2021. No active hemorrhaging vessel is seen within the hematoma. 8. Ascending colitis versus nondistention. Constipation and diverticulosis. 9. Nonobstructive nephrolithiasis. 10. Osteopenia and degenerative change without acute regional skeletal abnormality. 11. Results were called to  Dr. Chelsea Primus at 4:39 a.m., 02/02/2022 immediately after review of available images. Aortic Atherosclerosis (ICD10-I70.0). Electronically Signed   By: Telford Nab M.D.   On: 02/02/2022 05:37   DG Pelvis Portable  Result Date: 02/02/2022 CLINICAL DATA:  Trauma. EXAM: PORTABLE PELVIS 1-2 VIEWS COMPARISON:  01/22/2022 FINDINGS: There is no evidence of pelvic fracture or diastasis. No pelvic bone lesions are seen. Degenerative changes noted within both hips and lumbar spine. IMPRESSION: 1. No acute findings. 2. Degenerative joint disease. Electronically Signed   By: Kerby Moors M.D.   On: 02/02/2022 05:04   DG Chest Port 1 View  Result Date: 02/02/2022 CLINICAL DATA:  Fall injury. Level 1 trauma. Ventilator dependent respiratory failure. EXAM: PORTABLE CHEST 1 VIEW COMPARISON:  Portable chest 01/22/2023 a. FINDINGS: The lungs are clear. No pneumothorax or pleural effusion are seen. There is mild cardiomegaly with old CABG. No vascular congestion is evident. ETT is in place with tip 3 cm from carina. Multiple overlying monitor wires. No acute osseous abnormality is visible. IMPRESSION: No acute radiographic chest findings. Mild cardiomegaly with old CABG. ETT in place. Electronically Signed   By: Telford Nab M.D.   On: 02/02/2022 04:55    Microbiology: Recent Results (from the past 240 hour(s))  MRSA Next Gen by PCR, Nasal     Status: Abnormal   Collection Time: 02/02/22  8:09 AM   Specimen: Nasal Mucosa; Nasal Swab  Result Value Ref Range Status    MRSA by PCR Next Gen DETECTED (A) NOT DETECTED Final    Comment: CRITICAL RESULT CALLED TO, READ BACK BY AND VERIFIED WITH: RN [email or web address] AT 1004 ON 02/02/2022 BY T.SAAD. (NOTE) The GeneXpert MRSA Assay (FDA approved for NASAL specimens only), is one component of a comprehensive MRSA colonization surveillance program. It is not intended to diagnose MRSA infection nor to guide or monitor treatment for MRSA infections. Test performance is not FDA approved in patients less than 7 years old. Performed at [facility], [place] [address] [place], [place] [address]   Culture, Respiratory w Gram Stain     Status: None   Collection Time: 02/03/22  7:27 AM   Specimen: Tracheal Aspirate; Respiratory  Result Value Ref Range Status   Specimen Description TRACHEAL ASPIRATE  Final   Special Requests NONE  Final   Gram Stain   Final    MODERATE WBC PRESENT,BOTH PMN AND MONONUCLEAR ABUNDANT GRAM POSITIVE COCCI FEW GRAM POSITIVE RODS Performed at [facility], [place] [address] [place], [place] [address]    Culture   Final    ABUNDANT METHICILLIN RESISTANT STAPHYLOCOCCUS AUREUS   Report Status 02/05/2022 FINAL  Final   Organism ID, Bacteria METHICILLIN RESISTANT STAPHYLOCOCCUS AUREUS  Final      Susceptibility   Methicillin resistant staphylococcus aureus - MIC*    CIPROFLOXACIN >=8 RESISTANT Resistant     ERYTHROMYCIN >=8 RESISTANT Resistant     GENTAMICIN <=0.5 SENSITIVE Sensitive     OXACILLIN >=4 RESISTANT Resistant     TETRACYCLINE >=16 RESISTANT Resistant     VANCOMYCIN 1 SENSITIVE Sensitive     TRIMETH/SULFA <=10 SENSITIVE Sensitive     CLINDAMYCIN >=8 RESISTANT Resistant     RIFAMPIN <=0.5 SENSITIVE Sensitive     Inducible Clindamycin NEGATIVE Sensitive     * ABUNDANT METHICILLIN RESISTANT STAPHYLOCOCCUS AUREUS     Labs: Basic Metabolic Panel: Recent Labs  Lab 02/02/22 0410 02/02/22 0417 02/02/22 0447 02/02/22 0731 02/02/22 1552 02/03/22 0617 02/03/22 1649  02/04/22 0827 02/05/22  0403  NA 145 145 143 144  --  142  --  143 140  K 4.8 4.7 4.1 4.1  --  3.3*  --  3.8 4.2  CL 106 107  --   --   --  107  --  108 94*  CO2 26  --   --   --   --  24  --  23 32  GLUCOSE 166* 166*  --   --   --  110*  --  137* 85  BUN 26* 28*  --   --   --  22  --  20 20  CREATININE 1.32* 1.30*  --   --   --  0.81  --  0.84 1.01  CALCIUM 8.5*  --   --   --   --  8.6*  --  8.6* 9.0  MG  --   --   --   --  1.9 1.9 1.9  --  2.6*  PHOS  --   --   --   --  1.5* 4.3 2.7  --  4.3   Liver Function Tests: Recent Labs  Lab 02/02/22 0410 02/03/22 0617  AST 36 19  ALT 37 24  ALKPHOS 69 60  BILITOT 0.3 0.4  PROT 7.3 6.4*  ALBUMIN 3.4* 2.9*   No results for input(s): "LIPASE", "AMYLASE" in the last 168 hours. No results for input(s): "AMMONIA" in the last 168 hours. CBC: Recent Labs  Lab 02/02/22 0410 02/02/22 0417 02/02/22 0447 02/02/22 0731 02/04/22 0827  WBC 16.7*  --   --   --  15.3*  HGB 8.6* 9.9* 8.8* 9.2* 7.9*  HCT 30.8* 29.0* 26.0* 27.0* 25.5*  MCV 102.3*  --   --   --  94.4  PLT 344  --   --   --  323   Cardiac Enzymes: Recent Labs  Lab 02/02/22 0814  CKTOTAL 40*   BNP: BNP (last 3 results) Recent Labs    02/02/22 0410  BNP 302.8*    ProBNP (last 3 results) No results for input(s): "PROBNP" in the last 8760 hours.  CBG: Recent Labs  Lab 02/05/22 1205 02/05/22 1542 02/05/22 1933 02/06/22 0734 02/06/22 1144  GLUCAP 203* 297* 221* 214* 217*       Signed:  Desma Maxim MD.  [facility] 02/06/2022, 2:18 PM

## 2022-02-09 ENCOUNTER — Encounter: Payer: Self-pay | Admitting: Critical Care Medicine

## 2022-02-11 DIAGNOSIS — I959 Hypotension, unspecified: Secondary | ICD-10-CM | POA: Diagnosis not present

## 2022-02-11 DIAGNOSIS — R42 Dizziness and giddiness: Secondary | ICD-10-CM | POA: Diagnosis not present

## 2022-02-11 DIAGNOSIS — R0689 Other abnormalities of breathing: Secondary | ICD-10-CM | POA: Diagnosis not present

## 2022-02-11 DIAGNOSIS — R55 Syncope and collapse: Secondary | ICD-10-CM | POA: Diagnosis not present

## 2022-02-12 ENCOUNTER — Other Ambulatory Visit: Payer: Self-pay

## 2022-02-12 ENCOUNTER — Emergency Department (HOSPITAL_COMMUNITY): Payer: Medicare Other

## 2022-02-12 ENCOUNTER — Inpatient Hospital Stay (HOSPITAL_COMMUNITY)
Admission: EM | Admit: 2022-02-12 | Discharge: 2022-02-19 | DRG: 085 | Disposition: A | Discharge status: Home Health | Payer: Medicare Other | Attending: Family Medicine | Admitting: Family Medicine

## 2022-02-12 ENCOUNTER — Encounter (HOSPITAL_COMMUNITY): Payer: Self-pay

## 2022-02-12 DIAGNOSIS — S06340A Traumatic hemorrhage of right cerebrum without loss of consciousness, initial encounter: Secondary | ICD-10-CM | POA: Diagnosis present

## 2022-02-12 DIAGNOSIS — J101 Influenza due to other identified influenza virus with other respiratory manifestations: Secondary | ICD-10-CM | POA: Diagnosis present

## 2022-02-12 DIAGNOSIS — S066XAA Traumatic subarachnoid hemorrhage with loss of consciousness status unknown, initial encounter: Secondary | ICD-10-CM | POA: Diagnosis not present

## 2022-02-12 DIAGNOSIS — E7800 Pure hypercholesterolemia, unspecified: Secondary | ICD-10-CM | POA: Diagnosis present

## 2022-02-12 DIAGNOSIS — D6861 Antiphospholipid syndrome: Secondary | ICD-10-CM | POA: Diagnosis present

## 2022-02-12 DIAGNOSIS — I352 Nonrheumatic aortic (valve) stenosis with insufficiency: Secondary | ICD-10-CM | POA: Diagnosis present

## 2022-02-12 DIAGNOSIS — S066X0D Traumatic subarachnoid hemorrhage without loss of consciousness, subsequent encounter: Secondary | ICD-10-CM | POA: Diagnosis not present

## 2022-02-12 DIAGNOSIS — Z955 Presence of coronary angioplasty implant and graft: Secondary | ICD-10-CM

## 2022-02-12 DIAGNOSIS — S066X0A Traumatic subarachnoid hemorrhage without loss of consciousness, initial encounter: Secondary | ICD-10-CM | POA: Diagnosis not present

## 2022-02-12 DIAGNOSIS — R402362 Coma scale, best motor response, obeys commands, at arrival to emergency department: Secondary | ICD-10-CM | POA: Diagnosis present

## 2022-02-12 DIAGNOSIS — S2232XA Fracture of one rib, left side, initial encounter for closed fracture: Secondary | ICD-10-CM | POA: Diagnosis present

## 2022-02-12 DIAGNOSIS — M48061 Spinal stenosis, lumbar region without neurogenic claudication: Secondary | ICD-10-CM | POA: Diagnosis not present

## 2022-02-12 DIAGNOSIS — J9611 Chronic respiratory failure with hypoxia: Secondary | ICD-10-CM | POA: Diagnosis present

## 2022-02-12 DIAGNOSIS — K5730 Diverticulosis of large intestine without perforation or abscess without bleeding: Secondary | ICD-10-CM | POA: Diagnosis not present

## 2022-02-12 DIAGNOSIS — Z66 Do not resuscitate: Secondary | ICD-10-CM | POA: Diagnosis present

## 2022-02-12 DIAGNOSIS — Z043 Encounter for examination and observation following other accident: Secondary | ICD-10-CM | POA: Diagnosis not present

## 2022-02-12 DIAGNOSIS — R402142 Coma scale, eyes open, spontaneous, at arrival to emergency department: Secondary | ICD-10-CM | POA: Diagnosis present

## 2022-02-12 DIAGNOSIS — I2510 Atherosclerotic heart disease of native coronary artery without angina pectoris: Secondary | ICD-10-CM | POA: Diagnosis present

## 2022-02-12 DIAGNOSIS — Z8249 Family history of ischemic heart disease and other diseases of the circulatory system: Secondary | ICD-10-CM

## 2022-02-12 DIAGNOSIS — Z9981 Dependence on supplemental oxygen: Secondary | ICD-10-CM

## 2022-02-12 DIAGNOSIS — I10 Essential (primary) hypertension: Secondary | ICD-10-CM | POA: Diagnosis present

## 2022-02-12 DIAGNOSIS — Z79899 Other long term (current) drug therapy: Secondary | ICD-10-CM

## 2022-02-12 DIAGNOSIS — G4730 Sleep apnea, unspecified: Secondary | ICD-10-CM | POA: Diagnosis present

## 2022-02-12 DIAGNOSIS — I350 Nonrheumatic aortic (valve) stenosis: Secondary | ICD-10-CM | POA: Diagnosis not present

## 2022-02-12 DIAGNOSIS — J9601 Acute respiratory failure with hypoxia: Secondary | ICD-10-CM

## 2022-02-12 DIAGNOSIS — Z951 Presence of aortocoronary bypass graft: Secondary | ICD-10-CM

## 2022-02-12 DIAGNOSIS — Z6836 Body mass index (BMI) 36.0-36.9, adult: Secondary | ICD-10-CM

## 2022-02-12 DIAGNOSIS — Z7901 Long term (current) use of anticoagulants: Secondary | ICD-10-CM | POA: Diagnosis not present

## 2022-02-12 DIAGNOSIS — I609 Nontraumatic subarachnoid hemorrhage, unspecified: Secondary | ICD-10-CM

## 2022-02-12 DIAGNOSIS — W1830XA Fall on same level, unspecified, initial encounter: Secondary | ICD-10-CM | POA: Diagnosis present

## 2022-02-12 DIAGNOSIS — R55 Syncope and collapse: Secondary | ICD-10-CM | POA: Diagnosis not present

## 2022-02-12 DIAGNOSIS — J449 Chronic obstructive pulmonary disease, unspecified: Secondary | ICD-10-CM | POA: Diagnosis present

## 2022-02-12 DIAGNOSIS — J9621 Acute and chronic respiratory failure with hypoxia: Secondary | ICD-10-CM | POA: Diagnosis present

## 2022-02-12 DIAGNOSIS — J9622 Acute and chronic respiratory failure with hypercapnia: Secondary | ICD-10-CM | POA: Diagnosis not present

## 2022-02-12 DIAGNOSIS — I2583 Coronary atherosclerosis due to lipid rich plaque: Secondary | ICD-10-CM

## 2022-02-12 DIAGNOSIS — N200 Calculus of kidney: Secondary | ICD-10-CM | POA: Diagnosis not present

## 2022-02-12 DIAGNOSIS — F17210 Nicotine dependence, cigarettes, uncomplicated: Secondary | ICD-10-CM | POA: Diagnosis present

## 2022-02-12 DIAGNOSIS — J9602 Acute respiratory failure with hypercapnia: Secondary | ICD-10-CM

## 2022-02-12 DIAGNOSIS — R296 Repeated falls: Secondary | ICD-10-CM | POA: Diagnosis present

## 2022-02-12 DIAGNOSIS — S065X0A Traumatic subdural hemorrhage without loss of consciousness, initial encounter: Secondary | ICD-10-CM | POA: Diagnosis not present

## 2022-02-12 DIAGNOSIS — I110 Hypertensive heart disease with heart failure: Secondary | ICD-10-CM | POA: Diagnosis present

## 2022-02-12 DIAGNOSIS — Z7984 Long term (current) use of oral hypoglycemic drugs: Secondary | ICD-10-CM

## 2022-02-12 DIAGNOSIS — E669 Obesity, unspecified: Secondary | ICD-10-CM | POA: Diagnosis present

## 2022-02-12 DIAGNOSIS — N400 Enlarged prostate without lower urinary tract symptoms: Secondary | ICD-10-CM | POA: Diagnosis present

## 2022-02-12 DIAGNOSIS — W19XXXA Unspecified fall, initial encounter: Secondary | ICD-10-CM

## 2022-02-12 DIAGNOSIS — I700 Atherosclerosis of aorta: Secondary | ICD-10-CM | POA: Diagnosis present

## 2022-02-12 DIAGNOSIS — R402252 Coma scale, best verbal response, oriented, at arrival to emergency department: Secondary | ICD-10-CM | POA: Diagnosis present

## 2022-02-12 DIAGNOSIS — G4731 Primary central sleep apnea: Secondary | ICD-10-CM

## 2022-02-12 DIAGNOSIS — D649 Anemia, unspecified: Secondary | ICD-10-CM | POA: Diagnosis present

## 2022-02-12 DIAGNOSIS — Y92009 Unspecified place in unspecified non-institutional (private) residence as the place of occurrence of the external cause: Secondary | ICD-10-CM

## 2022-02-12 DIAGNOSIS — I358 Other nonrheumatic aortic valve disorders: Secondary | ICD-10-CM | POA: Diagnosis present

## 2022-02-12 DIAGNOSIS — I5032 Chronic diastolic (congestive) heart failure: Secondary | ICD-10-CM | POA: Diagnosis present

## 2022-02-12 DIAGNOSIS — Z91199 Patient's noncompliance with other medical treatment and regimen due to unspecified reason: Secondary | ICD-10-CM

## 2022-02-12 DIAGNOSIS — I890 Lymphedema, not elsewhere classified: Secondary | ICD-10-CM | POA: Diagnosis present

## 2022-02-12 DIAGNOSIS — Z515 Encounter for palliative care: Secondary | ICD-10-CM

## 2022-02-12 DIAGNOSIS — Z7982 Long term (current) use of aspirin: Secondary | ICD-10-CM

## 2022-02-12 DIAGNOSIS — J9612 Chronic respiratory failure with hypercapnia: Secondary | ICD-10-CM

## 2022-02-12 DIAGNOSIS — I6523 Occlusion and stenosis of bilateral carotid arteries: Secondary | ICD-10-CM

## 2022-02-12 DIAGNOSIS — G9341 Metabolic encephalopathy: Secondary | ICD-10-CM | POA: Diagnosis present

## 2022-02-12 DIAGNOSIS — G9340 Encephalopathy, unspecified: Secondary | ICD-10-CM | POA: Insufficient documentation

## 2022-02-12 DIAGNOSIS — Z86711 Personal history of pulmonary embolism: Secondary | ICD-10-CM | POA: Diagnosis present

## 2022-02-12 DIAGNOSIS — Z5320 Procedure and treatment not carried out because of patient's decision for unspecified reasons: Secondary | ICD-10-CM | POA: Diagnosis present

## 2022-02-12 DIAGNOSIS — E1169 Type 2 diabetes mellitus with other specified complication: Secondary | ICD-10-CM | POA: Diagnosis present

## 2022-02-12 DIAGNOSIS — I4510 Unspecified right bundle-branch block: Secondary | ICD-10-CM | POA: Diagnosis present

## 2022-02-12 DIAGNOSIS — Z888 Allergy status to other drugs, medicaments and biological substances status: Secondary | ICD-10-CM

## 2022-02-12 DIAGNOSIS — E785 Hyperlipidemia, unspecified: Secondary | ICD-10-CM | POA: Diagnosis present

## 2022-02-12 DIAGNOSIS — R29898 Other symptoms and signs involving the musculoskeletal system: Secondary | ICD-10-CM

## 2022-02-12 DIAGNOSIS — J438 Other emphysema: Secondary | ICD-10-CM

## 2022-02-12 DIAGNOSIS — E662 Morbid (severe) obesity with alveolar hypoventilation: Secondary | ICD-10-CM | POA: Diagnosis present

## 2022-02-12 DIAGNOSIS — R040 Epistaxis: Secondary | ICD-10-CM | POA: Diagnosis present

## 2022-02-12 LAB — PROTIME-INR
INR: 2.2 — ABNORMAL HIGH (ref 0.8–1.2)
INR: 2.2 — ABNORMAL HIGH (ref 0.8–1.2)
Prothrombin Time: 23.9 seconds — ABNORMAL HIGH (ref 11.4–15.2)
Prothrombin Time: 23.9 seconds — ABNORMAL HIGH (ref 11.4–15.2)

## 2022-02-12 LAB — CBC
HCT: 31.1 % — ABNORMAL LOW (ref 39.0–52.0)
HCT: 31.3 % — ABNORMAL LOW (ref 39.0–52.0)
Hemoglobin: 9.2 g/dL — ABNORMAL LOW (ref 13.0–17.0)
Hemoglobin: 9.2 g/dL — ABNORMAL LOW (ref 13.0–17.0)
MCH: 27.6 pg (ref 26.0–34.0)
MCH: 27.8 pg (ref 26.0–34.0)
MCHC: 29.4 g/dL — ABNORMAL LOW (ref 30.0–36.0)
MCHC: 29.6 g/dL — ABNORMAL LOW (ref 30.0–36.0)
MCV: 93.4 fL (ref 80.0–100.0)
MCV: 94.6 fL (ref 80.0–100.0)
Platelets: 297 10*3/uL (ref 150–400)
Platelets: 308 10*3/uL (ref 150–400)
RBC: 3.31 MIL/uL — ABNORMAL LOW (ref 4.22–5.81)
RBC: 3.33 MIL/uL — ABNORMAL LOW (ref 4.22–5.81)
RDW: 15.9 % — ABNORMAL HIGH (ref 11.5–15.5)
RDW: 15.9 % — ABNORMAL HIGH (ref 11.5–15.5)
WBC: 8.9 10*3/uL (ref 4.0–10.5)
WBC: 9.3 10*3/uL (ref 4.0–10.5)
nRBC: 0 % (ref 0.0–0.2)
nRBC: 0 % (ref 0.0–0.2)

## 2022-02-12 LAB — I-STAT CHEM 8, ED
BUN: 15 mg/dL (ref 8–23)
Calcium, Ion: 1.04 mmol/L — ABNORMAL LOW (ref 1.15–1.40)
Chloride: 104 mmol/L (ref 98–111)
Creatinine, Ser: 0.9 mg/dL (ref 0.61–1.24)
Glucose, Bld: 89 mg/dL (ref 70–99)
HCT: 29 % — ABNORMAL LOW (ref 39.0–52.0)
Hemoglobin: 9.9 g/dL — ABNORMAL LOW (ref 13.0–17.0)
Potassium: 4.1 mmol/L (ref 3.5–5.1)
Sodium: 139 mmol/L (ref 135–145)
TCO2: 25 mmol/L (ref 22–32)

## 2022-02-12 LAB — COMPREHENSIVE METABOLIC PANEL
ALT: 27 U/L (ref 0–44)
AST: 20 U/L (ref 15–41)
Albumin: 3.3 g/dL — ABNORMAL LOW (ref 3.5–5.0)
Alkaline Phosphatase: 66 U/L (ref 38–126)
Anion gap: 13 (ref 5–15)
BUN: 14 mg/dL (ref 8–23)
CO2: 27 mmol/L (ref 22–32)
Calcium: 9 mg/dL (ref 8.9–10.3)
Chloride: 100 mmol/L (ref 98–111)
Creatinine, Ser: 0.83 mg/dL (ref 0.61–1.24)
GFR, Estimated: >60 mL/min (ref >60)
Glucose, Bld: 94 mg/dL (ref 70–99)
Potassium: 4.1 mmol/L (ref 3.5–5.1)
Sodium: 140 mmol/L (ref 135–145)
Total Bilirubin: 0.3 mg/dL (ref 0.3–1.2)
Total Protein: 7.1 g/dL (ref 6.5–8.1)

## 2022-02-12 LAB — TROPONIN I (HIGH SENSITIVITY)
Troponin I (High Sensitivity): 7 ng/L (ref <18)
Troponin I (High Sensitivity): 9 ng/L (ref <18)

## 2022-02-12 MED ORDER — ALBUTEROL SULFATE (2.5 MG/3ML) 0.083% IN NEBU: 2.5000 mg | INHALATION_SOLUTION | Freq: Four times a day (QID) | RESPIRATORY_TRACT | Status: DC | PRN

## 2022-02-12 MED ORDER — SODIUM CHLORIDE 0.9 % IV SOLN: 250.0000 mL | INTRAVENOUS | Status: DC | PRN

## 2022-02-12 MED ORDER — ROSUVASTATIN CALCIUM 5 MG PO TABS
10.0000 mg | ORAL_TABLET | Freq: Every evening | ORAL | Status: DC
  Administered 2022-02-13 – 2022-02-18 (×6): 10 mg via ORAL
  Filled 2022-02-12 (×6): qty 2

## 2022-02-12 MED ORDER — UMECLIDINIUM BROMIDE 62.5 MCG/ACT IN AEPB
1.0000 | INHALATION_SPRAY | Freq: Every day | RESPIRATORY_TRACT | Status: DC
  Administered 2022-02-13 – 2022-02-19 (×7): 1 via RESPIRATORY_TRACT
  Filled 2022-02-12 (×2): qty 7

## 2022-02-12 MED ORDER — DULOXETINE HCL 60 MG PO CPEP
90.0000 mg | ORAL_CAPSULE | Freq: Every day | ORAL | Status: DC
  Administered 2022-02-14 – 2022-02-18 (×5): 90 mg via ORAL
  Filled 2022-02-12 (×7): qty 1

## 2022-02-12 MED ORDER — TORSEMIDE 20 MG PO TABS
20.0000 mg | ORAL_TABLET | Freq: Every day | ORAL | Status: DC
  Administered 2022-02-12 – 2022-02-19 (×6): 20 mg via ORAL
  Filled 2022-02-12 (×8): qty 1

## 2022-02-12 MED ORDER — TORSEMIDE 20 MG PO TABS: 20.0000 mg | ORAL_TABLET | Freq: Every day | ORAL | Status: DC

## 2022-02-12 MED ORDER — FLUTICASONE-UMECLIDIN-VILANT 200-62.5-25 MCG/ACT IN AEPB: INHALATION_SPRAY | Freq: Every day | RESPIRATORY_TRACT | Status: DC

## 2022-02-12 MED ORDER — METOPROLOL TARTRATE 5 MG/5ML IV SOLN: 5.0000 mg | INTRAVENOUS | Status: DC | PRN

## 2022-02-12 MED ORDER — ACETAMINOPHEN 325 MG PO TABS
650.0000 mg | ORAL_TABLET | Freq: Four times a day (QID) | ORAL | Status: DC | PRN
  Administered 2022-02-12 – 2022-02-16 (×5): 650 mg via ORAL
  Filled 2022-02-12 (×5): qty 2

## 2022-02-12 MED ORDER — SODIUM CHLORIDE 0.9 % WEIGHT BASED INFUSION
3.0000 mL/kg/h | INTRAVENOUS | Status: DC
  Administered 2022-02-13: 3 mL/kg/h via INTRAVENOUS

## 2022-02-12 MED ORDER — SODIUM CHLORIDE 0.9 % WEIGHT BASED INFUSION
1.0000 mL/kg/h | INTRAVENOUS | Status: DC
  Administered 2022-02-13: 1 mL/kg/h via INTRAVENOUS

## 2022-02-12 MED ORDER — IPRATROPIUM-ALBUTEROL 0.5-2.5 (3) MG/3ML IN SOLN
3.0000 mL | RESPIRATORY_TRACT | Status: DC | PRN
  Administered 2022-02-14: 3 mL via RESPIRATORY_TRACT
  Filled 2022-02-12: qty 3

## 2022-02-12 MED ORDER — LINEZOLID 600 MG PO TABS
600.0000 mg | ORAL_TABLET | Freq: Two times a day (BID) | ORAL | Status: AC
  Administered 2022-02-13 (×2): 600 mg via ORAL
  Filled 2022-02-12 (×2): qty 1

## 2022-02-12 MED ORDER — ROSUVASTATIN CALCIUM 20 MG PO TABS
20.0000 mg | ORAL_TABLET | Freq: Every day | ORAL | Status: DC
  Administered 2022-02-12: 20 mg via ORAL
  Filled 2022-02-12: qty 1

## 2022-02-12 MED ORDER — SODIUM CHLORIDE 0.9% FLUSH
3.0000 mL | Freq: Two times a day (BID) | INTRAVENOUS | Status: DC
  Administered 2022-02-12 – 2022-02-13 (×2): 3 mL via INTRAVENOUS

## 2022-02-12 MED ORDER — TAMSULOSIN HCL 0.4 MG PO CAPS
0.4000 mg | ORAL_CAPSULE | Freq: Every day | ORAL | Status: DC
  Administered 2022-02-12 – 2022-02-18 (×7): 0.4 mg via ORAL
  Filled 2022-02-12 (×7): qty 1

## 2022-02-12 MED ORDER — SODIUM CHLORIDE 0.9% FLUSH
3.0000 mL | Freq: Two times a day (BID) | INTRAVENOUS | Status: DC
  Administered 2022-02-12 – 2022-02-19 (×14): 3 mL via INTRAVENOUS

## 2022-02-12 MED ORDER — METOPROLOL TARTRATE 100 MG PO TABS
100.0000 mg | ORAL_TABLET | Freq: Once | ORAL | Status: AC
  Administered 2022-02-13: 100 mg via ORAL
  Filled 2022-02-12: qty 1

## 2022-02-12 MED ORDER — GABAPENTIN 600 MG PO TABS
600.0000 mg | ORAL_TABLET | Freq: Two times a day (BID) | ORAL | Status: DC
  Administered 2022-02-12 – 2022-02-19 (×13): 600 mg via ORAL
  Filled 2022-02-12 (×14): qty 1

## 2022-02-12 MED ORDER — DULOXETINE HCL 30 MG PO CPEP: 30.0000 mg | ORAL_CAPSULE | Freq: Every day | ORAL | Status: DC

## 2022-02-12 MED ORDER — METOPROLOL TARTRATE 12.5 MG HALF TABLET
12.5000 mg | ORAL_TABLET | Freq: Two times a day (BID) | ORAL | Status: DC
  Administered 2022-02-12 – 2022-02-18 (×13): 12.5 mg via ORAL
  Filled 2022-02-12 (×15): qty 1

## 2022-02-12 MED ORDER — VITAMIN K1 10 MG/ML IJ SOLN
10.0000 mg | INTRAVENOUS | Status: DC
  Filled 2022-02-12: qty 1

## 2022-02-12 MED ORDER — IOHEXOL 350 MG/ML SOLN
100.0000 mL | Freq: Once | INTRAVENOUS | Status: AC | PRN
  Administered 2022-02-12: 100 mL via INTRAVENOUS

## 2022-02-12 MED ORDER — TOPIRAMATE 25 MG PO TABS
75.0000 mg | ORAL_TABLET | Freq: Three times a day (TID) | ORAL | Status: DC
  Administered 2022-02-12 – 2022-02-19 (×19): 75 mg via ORAL
  Filled 2022-02-12 (×19): qty 3

## 2022-02-12 MED ORDER — ACETAMINOPHEN 650 MG RE SUPP: 650.0000 mg | Freq: Four times a day (QID) | RECTAL | Status: DC | PRN

## 2022-02-12 MED ORDER — PANTOPRAZOLE SODIUM 40 MG PO TBEC
40.0000 mg | DELAYED_RELEASE_TABLET | Freq: Two times a day (BID) | ORAL | Status: DC
  Administered 2022-02-12 – 2022-02-14 (×5): 40 mg via ORAL
  Filled 2022-02-12 (×6): qty 1

## 2022-02-12 MED ORDER — PROTHROMBIN COMPLEX CONC HUMAN 500 UNITS IV KIT
1619.0000 [IU] | PACK | Status: DC
  Filled 2022-02-12: qty 1619

## 2022-02-12 MED ORDER — FLUTICASONE FUROATE-VILANTEROL 200-25 MCG/ACT IN AEPB
1.0000 | INHALATION_SPRAY | Freq: Every day | RESPIRATORY_TRACT | Status: DC
  Administered 2022-02-13 – 2022-02-19 (×7): 1 via RESPIRATORY_TRACT
  Filled 2022-02-12: qty 28

## 2022-02-12 MED ORDER — SODIUM CHLORIDE 0.9% FLUSH: 3.0000 mL | INTRAVENOUS | Status: DC | PRN

## 2022-02-12 MED ORDER — LINEZOLID 600 MG PO TABS: 600.0000 mg | ORAL_TABLET | Freq: Two times a day (BID) | ORAL | Status: DC

## 2022-02-12 NOTE — ED Notes (Signed)
Trauma Response Nurse Documentation   Jose [name] is a 66 y.o. male arriving to [facility] ED via EMS  On warfarin daily. Trauma was activated as a Level 2 by ED charge RN based on the following trauma criteria Elderly patients > 65 with head trauma on anti-coagulation (excluding ASA). Trauma team at the bedside on patient arrival.   Patient cleared for CT by Dr. Pilar Plate EDO. Pt transported to CT with trauma response nurse present to monitor. RN remained with the patient throughout their absence from the department for clinical observation.   GCS 15.  History   Past Medical History:  Diagnosis Date   Antiphospholipid antibody positive    Antiphospholipid antibody syndrome (HCC)    CAD (coronary artery disease)    CAD S/P percutaneous coronary angioplasty '98, '04, Feb 2017   [facility]   Carotid arterial disease (HCC) 10/2015   moderate bilateral   COPD (chronic obstructive pulmonary disease) (HCC)    Coronary artery disease    DM (diabetes mellitus) (HCC)    HTN (hypertension)    Hypertension    Non-insulin treated type 2 diabetes mellitus (HCC)    OSA (obstructive sleep apnea)    Pulmonary emboli (HCC) 2011   RBBB    Sleep apnea    Vertebrobasilar artery insufficiency      Past Surgical History:  Procedure Laterality Date   APPENDECTOMY     APPLICATION OF WOUND VAC Left 03/18/2019   Procedure: Application Of Wound Vac;  Surgeon: Nadara Mustard, MD;  Location: [facility] OR;  Service: Orthopedics;  Laterality: Left;   BACK SURGERY     BACK SURGERY  '89, '06   COLONOSCOPY     CORONARY ANGIOPLASTY WITH STENT PLACEMENT     CORONARY ANGIOPLASTY WITH STENT PLACEMENT  '98, '04, Feb 2017   I & D EXTREMITY Left 03/15/2019   Procedure: DEBRIDEMENT LEFT THIGH;  Surgeon: Nadara Mustard, MD;  Location: [facility] OR;  Service: Orthopedics;  Laterality: Left;   INCISION AND DRAINAGE OF WOUND Left 03/18/2019   Procedure: LEFT THIGH DEBRIDEMENT;  Surgeon: Nadara Mustard, MD;  Location: [facility] OR;  Service:  Orthopedics;  Laterality: Left;       Initial Focused Assessment (If applicable, or please see trauma documentation): Alert male presents via EMS from home after a syncopal episode, unwitnessed fall, no obvious external trauma noted Airway patent/unobstructed, BS clear No obvious uncontrolled hemorrhage, hematoma to buttocks from previous hospitalization GCS 15  CT's Completed:   CT Head, CT C-Spine, CT Chest w/ contrast, and CT abdomen/pelvis w/ contrast CT L spine  Interventions:  IV start via ultrasound and trauma lab draw EKG Portable chest XRAY CT head, cervical spine angio chest, abdomen, pelvis, L spine  Plan for disposition:  Pending workup   Consults completed:  none at the time of this note.  Event Summary: Presents via EMS after a syncopal episode and unwitnessed fall, warfarin pt. Recent hospitalization for respiratory arrest requiring intubation. Unknown if he hit his head. No obvious injuries noted on exam. Escorted to CT by TRN. Dispo pending workup.   Bedside handoff with ED RN [name].    Jose [name]  Trauma Response RN  Please call TRN at [phone] for further assistance.

## 2022-02-12 NOTE — ED Notes (Signed)
Patient does not want 18g IV placed until he is going to imaging.

## 2022-02-12 NOTE — ED Notes (Signed)
Patient to CT at this time

## 2022-02-12 NOTE — Progress Notes (Signed)
Responded to Trauma Alert. Upon Arrival patient being cared for by staff. He was awake and alert, a brief greeting and thanked for the visit.

## 2022-02-12 NOTE — ED Notes (Signed)
Attempted to call report to inpatient RN, Deanna Artis.

## 2022-02-12 NOTE — ED Provider Notes (Signed)
Patient presents after an additional episode of sudden onset syncope which unfortunately led to head injury and subarachnoid hemorrhage.  Patient is on anticoagulant.  Fortunately neurologically patient is doing well no vomiting on assessment discussion.  Signout was to follow-up repeat CT scan with follow-up recommendations by neurosurgery and Dr. Jacinto Halim cardiology.  Discussed with patient regarding repeat CT scan results showing mild enlargement of bleeding area and patient is high risk given medication and history.  Discussed with Dr. Jacinto Halim who agrees with admitting/observing with continued neurosurgery recommendations.  Cardiology will follow to place loop recorder and on telemetry.  Dr. Jacinto Halim requested unassigned/hospitalist to admit page placed.  .Critical Care  Performed by: Blane Ohara, MD Authorized by: Blane Ohara, MD   Critical care provider statement:    Critical care time (minutes):  30   Critical care start time:  02/12/2022 8:00 AM   Critical care end time:  02/12/2022 8:30 AM   Critical care time was exclusive of:  Separately billable procedures and treating other patients and teaching time   Critical care was necessary to treat or prevent imminent or life-threatening deterioration of the following conditions:  Cardiac failure   Critical care was time spent personally by me on the following activities:  Development of treatment plan with patient or surrogate, discussions with consultants, evaluation of patient's response to treatment, examination of patient, ordering and review of laboratory studies, ordering and review of radiographic studies, ordering and performing treatments and interventions, pulse oximetry, re-evaluation of patient's condition and review of old charts  Syncope, unspecified syncope type  Subarachnoid hemorrhage (HCC)  Fall, initial encounter  Anticoagulant long-term use    Blane Ohara, MD 02/12/22 [id number]

## 2022-02-12 NOTE — ED Notes (Signed)
Pt alert and oriented. Denies c/o at this time.

## 2022-02-12 NOTE — Consult Note (Signed)
Reason for Consult:SAH Referring Physician: EDP  Jose [name] is an 66 y.o. male.   HPI:  66 year old male presented to the Ed after a fall yesteryda. States that he has fallen 4 times in the last week. He was just discharged last Thursday after being intubated for 3 days. Has history of antiphospholipid syndrome and he is on coumadin. Denies any HA NV or dizziness. He has an extensive medical history and has been worked up for syncope for the last 2 years with no answers.   Past Medical History:  Diagnosis Date   Antiphospholipid antibody positive    Antiphospholipid antibody syndrome (HCC)    CAD (coronary artery disease)    CAD S/P percutaneous coronary angioplasty '98, '04, Feb 2017   [facility]   Carotid arterial disease (HCC) 10/2015   moderate bilateral   COPD (chronic obstructive pulmonary disease) (HCC)    Coronary artery disease    DM (diabetes mellitus) (HCC)    HTN (hypertension)    Hypertension    Non-insulin treated type 2 diabetes mellitus (HCC)    OSA (obstructive sleep apnea)    Pulmonary emboli (HCC) 2011   RBBB    Sleep apnea    Vertebrobasilar artery insufficiency     Past Surgical History:  Procedure Laterality Date   APPENDECTOMY     APPLICATION OF WOUND VAC Left 03/18/2019   Procedure: Application Of Wound Vac;  Surgeon: Nadara Mustarduda, Marcus V, MD;  [facility];  Service: Orthopedics;  Laterality: Left;   BACK SURGERY     BACK SURGERY  '89, '06   COLONOSCOPY     CORONARY ANGIOPLASTY WITH STENT PLACEMENT     CORONARY ANGIOPLASTY WITH STENT PLACEMENT  '98, '04, Feb 2017   I & D EXTREMITY Left 03/15/2019   Procedure: DEBRIDEMENT LEFT THIGH;  Surgeon: Nadara Mustarduda, Marcus V, MD;  [facility];  Service: Orthopedics;  Laterality: Left;   INCISION AND DRAINAGE OF WOUND Left 03/18/2019   Procedure: LEFT THIGH DEBRIDEMENT;  Surgeon: Nadara Mustarduda, Marcus V, MD;  [facility];  Service: Orthopedics;  Laterality: Left;    Allergies  Allergen Reactions   Melatonin Anxiety   Altace  [Ramipril] Cough    Social History   Tobacco Use   Smoking status: Former    Packs/day: 0.50    Years: 40.00    Total pack years: 20.00    Types: Cigarettes    Quit date: 01/15/2019    Years since quitting: 3.0   Smokeless tobacco: Never   Tobacco comments:    has started smoking again, willing to quit.  Substance Use Topics   Alcohol use: No    Family History  Problem Relation Age of Onset   CAD Father 3772     Review of Systems  Positive ROS: as above  All other systems have been reviewed and were otherwise negative with the exception of those mentioned in the HPI and as above.  Objective: Vital signs in last 24 hours: Temp:  [98 F (36.7 C)-98.3 F (36.8 C)] 98 F (36.7 C) (11/30 0745) Pulse Rate:  [78-102] 102 (11/30 0745) Resp:  [15-23] 20 (11/30 0745) BP: (105-146)/(62-99) 144/62 (11/30 0745) SpO2:  [81 %-100 %] 81 % (11/30 0745)  General Appearance: Alert, cooperative, no distress, appears stated age Head: Normocephalic, without obvious abnormality, atraumatic Eyes: PERRL, conjunctiva/corneas clear, EOM's intact, fundi benign, both eyes      Ears: Normal TM's and external ear canals, both ears Lungs: respirations unlabored Heart: Regular rate and  rhythm Pulses: 2+ and symmetric all extremities Skin: Skin color, texture, turgor normal, no rashes or lesions  NEUROLOGIC:   Mental status: A&O x4, no aphasia, good attention span, Memory and fund of knowledge Motor Exam - grossly normal, normal tone and bulk Sensory Exam - grossly normal Reflexes: symmetric, no pathologic reflexes, No Hoffman's, No clonus Coordination - grossly normal Gait - not tested Balance - nto tested Cranial Nerves: I: smell Not tested  II: visual acuity  OS: na    OD: na  II: visual fields Full to confrontation  II: pupils Equal, round, reactive to light  III,VII: ptosis None  III,IV,VI: extraocular muscles  Full ROM  V: mastication Normal  V: facial light touch sensation  Normal   V,VII: corneal reflex  Present  VII: facial muscle function - upper  Normal  VII: facial muscle function - lower Normal  VIII: hearing Not tested  IX: soft palate elevation  Normal  IX,X: gag reflex Present  XI: trapezius strength  5/5  XI: sternocleidomastoid strength 5/5  XI: neck flexion strength  5/5  XII: tongue strength  Normal    Data Review Lab Results  Component Value Date   WBC 9.3 02/12/2022   HGB 9.9 (L) 02/12/2022   HCT 29.0 (L) 02/12/2022   MCV 94.6 02/12/2022   PLT 308 02/12/2022   Lab Results  Component Value Date   NA 139 02/12/2022   K 4.1 02/12/2022   CL 104 02/12/2022   CO2 27 02/12/2022   BUN 15 02/12/2022   CREATININE 0.90 02/12/2022   GLUCOSE 89 02/12/2022   Lab Results  Component Value Date   INR 2.2 (H) 02/12/2022    Radiology: CT ABDOMEN PELVIS W CONTRAST  Result Date: 02/12/2022 CLINICAL DATA:  Syncope. EXAM: CT ABDOMEN AND PELVIS WITH CONTRAST TECHNIQUE: Multidetector CT imaging of the abdomen and pelvis was performed using the standard protocol following bolus administration of intravenous contrast. RADIATION DOSE REDUCTION: This exam was performed according to the departmental dose-optimization program which includes automated exposure control, adjustment of the mA and/or kV according to patient size and/or use of iterative reconstruction technique. CONTRAST:  OMNIPAQUE IOHEXOL 350 MG/ML SOLN COMPARISON:  February 02, 2022 FINDINGS: Lower chest: Multiple sternal wires are noted. Mild, posterior right basilar atelectasis and/or early infiltrate is seen. Hepatobiliary: No focal liver abnormality is seen. No gallstones, gallbladder wall thickening, or biliary dilatation. Pancreas: Unremarkable. No pancreatic ductal dilatation or surrounding inflammatory changes. Spleen: Normal in size without focal abnormality. Adrenals/Urinary Tract: Adrenal glands are unremarkable. Kidneys are normal in size, without obstructing renal calculi, focal lesion,  or hydronephrosis. A 12 mm nonobstructing renal calculus is seen within the mid right kidney. A 2 mm nonobstructing renal calculus is also seen within the lower pole of the left kidney. The urinary bladder is markedly distended and is unremarkable. Stomach/Bowel: Stomach is within normal limits. The appendix is surgically absent. No evidence of bowel wall thickening, distention, or inflammatory changes. Noninflamed diverticula are seen throughout the large bowel. Vascular/Lymphatic: Aortic atherosclerosis. No enlarged abdominal or pelvic lymph nodes. Reproductive: The prostate gland is not clearly identified. Other: No abdominal wall hernia or abnormality. No abdominopelvic ascites. Musculoskeletal: Acute, nondisplaced fracture is seen within the anterior seventh left rib. Multilevel degenerative changes seen throughout the lumbar spine. IMPRESSION: 1. Mild, posterior right basilar atelectasis and/or early infiltrate. 2. Anterior seventh left rib fracture. 3. Bilateral nonobstructing renal calculi. 4. Colonic diverticulosis. 5. Aortic atherosclerosis. Aortic Atherosclerosis (ICD10-I70.0). Electronically Signed   By: Waylan Rocher  [place] M.D.   On: 02/12/2022 02:08   CT HEAD WO CONTRAST ( )  Result Date: 02/12/2022 CLINICAL DATA:  Fall while on anti coagulation EXAM: CT HEAD WITHOUT CONTRAST CT CERVICAL SPINE WITHOUT CONTRAST TECHNIQUE: Multidetector CT imaging of the head and cervical spine was performed following the standard protocol without intravenous contrast. Multiplanar CT image reconstructions of the cervical spine were also generated. RADIATION DOSE REDUCTION: This exam was performed according to the departmental dose-optimization program which includes automated exposure control, adjustment of the mA and/or kV according to patient size and/or use of iterative reconstruction technique. COMPARISON:  None Available. FINDINGS: CT HEAD FINDINGS Brain: Small area of subarachnoid hemorrhage at the superior  medial right convexity. The size and configuration of the ventricles and extra-axial CSF spaces are normal. The brain parenchyma is normal, without evidence of acute or chronic infarction. Vascular: No abnormal hyperdensity of the major intracranial arteries or dural venous sinuses. No intracranial atherosclerosis. Skull: The visualized skull base, calvarium and extracranial soft tissues are normal. Sinuses/Orbits: No fluid levels or advanced mucosal thickening of the visualized paranasal sinuses. No mastoid or middle ear effusion. The orbits are normal. CT CERVICAL SPINE FINDINGS Alignment: No static subluxation. Facets are aligned. Occipital condyles are normally positioned. Skull base and vertebrae: No acute fracture. Soft tissues and spinal canal: No prevertebral fluid or swelling. No visible canal hematoma. Disc levels: No advanced spinal canal or neural foraminal stenosis. Upper chest: No pneumothorax, pulmonary nodule or pleural effusion. Other: Normal visualized paraspinal cervical soft tissues. IMPRESSION: 1. Small area of subarachnoid hemorrhage at the superior medial right convexity. 2. No acute fracture or static subluxation of the cervical spine. Critical Value/emergent results were called by telephone at the time of interpretation on 02/12/2022 at 2:07 am to provider [facility] , who verbally acknowledged these results. Electronically Signed   By: Deatra Robinson M.D.   On: 02/12/2022 02:07   CT CERVICAL SPINE WO CONTRAST  Result Date: 02/12/2022 CLINICAL DATA:  Fall while on anti coagulation EXAM: CT HEAD WITHOUT CONTRAST CT CERVICAL SPINE WITHOUT CONTRAST TECHNIQUE: Multidetector CT imaging of the head and cervical spine was performed following the standard protocol without intravenous contrast. Multiplanar CT image reconstructions of the cervical spine were also generated. RADIATION DOSE REDUCTION: This exam was performed according to the departmental dose-optimization program which includes  automated exposure control, adjustment of the mA and/or kV according to patient size and/or use of iterative reconstruction technique. COMPARISON:  None Available. FINDINGS: CT HEAD FINDINGS Brain: Small area of subarachnoid hemorrhage at the superior medial right convexity. The size and configuration of the ventricles and extra-axial CSF spaces are normal. The brain parenchyma is normal, without evidence of acute or chronic infarction. Vascular: No abnormal hyperdensity of the major intracranial arteries or dural venous sinuses. No intracranial atherosclerosis. Skull: The visualized skull base, calvarium and extracranial soft tissues are normal. Sinuses/Orbits: No fluid levels or advanced mucosal thickening of the visualized paranasal sinuses. No mastoid or middle ear effusion. The orbits are normal. CT CERVICAL SPINE FINDINGS Alignment: No static subluxation. Facets are aligned. Occipital condyles are normally positioned. Skull base and vertebrae: No acute fracture. Soft tissues and spinal canal: No prevertebral fluid or swelling. No visible canal hematoma. Disc levels: No advanced spinal canal or neural foraminal stenosis. Upper chest: No pneumothorax, pulmonary nodule or pleural effusion. Other: Normal visualized paraspinal cervical soft tissues. IMPRESSION: 1. Small area of subarachnoid hemorrhage at the superior medial right convexity. 2. No acute fracture or static subluxation of the  cervical spine. Critical Value/emergent results were called by telephone at the time of interpretation on 02/12/2022 at 2:07 am to provider [facility] , who verbally acknowledged these results. Electronically Signed   By: Deatra Robinson M.D.   On: 02/12/2022 02:07   CT L-SPINE NO CHARGE  Result Date: 02/12/2022 CLINICAL DATA:  Fall EXAM: CT LUMBAR SPINE WITHOUT CONTRAST TECHNIQUE: Multidetector CT imaging of the lumbar spine was performed without intravenous contrast administration. Multiplanar CT image reconstructions were  also generated. RADIATION DOSE REDUCTION: This exam was performed according to the departmental dose-optimization program which includes automated exposure control, adjustment of the mA and/or kV according to patient size and/or use of iterative reconstruction technique. COMPARISON:  None Available. FINDINGS: Segmentation: 5 lumbar type vertebrae. Alignment: Normal. Vertebrae: Wedge compression deformity of L1 is age indeterminate but unchanged since 01/22/2022. Disc levels: There is mild spinal canal stenosis at L2-3 and L3-4. Multilevel facet arthrosis and foraminal stenosis, worst at L2-3. IMPRESSION: 1. Wedge compression deformity of L1 is age indeterminate but unchanged since 01/22/2022. 2. Multilevel facet arthrosis and foraminal stenosis, worst at L2-3. Electronically Signed   By: Deatra Robinson M.D.   On: 02/12/2022 02:04   CT Angio Chest Pulmonary Embolism (PE) W or WO Contrast  Result Date: 02/12/2022 CLINICAL DATA:  Syncope and subsequent fall. EXAM: CT ANGIOGRAPHY CHEST WITH CONTRAST TECHNIQUE: Multidetector CT imaging of the chest was performed using the standard protocol during bolus administration of intravenous contrast. Multiplanar CT image reconstructions and MIPs were obtained to evaluate the vascular anatomy. RADIATION DOSE REDUCTION: This exam was performed according to the departmental dose-optimization program which includes automated exposure control, adjustment of the mA and/or kV according to patient size and/or use of iterative reconstruction technique. CONTRAST:  OMNIPAQUE IOHEXOL 350 MG/ML SOLN COMPARISON:  February 02, 2022 FINDINGS: Cardiovascular: There is mild calcification of the aortic arch, without evidence of aortic aneurysm. Satisfactory opacification of the pulmonary arteries to the segmental level. No evidence of pulmonary embolism. There is mild to moderate severity cardiomegaly with marked severity coronary artery calcification and coronary artery vascular clips.  No pericardial effusion. Mediastinum/Nodes: Multiple sternal wires are noted. No enlarged mediastinal, hilar, or axillary lymph nodes. A stable subcentimeter thyroid nodule is seen within the posterior aspect of the left lobe of the thyroid gland. The trachea and esophagus demonstrate no significant findings. Lungs/Pleura: Mild atelectasis and/or early infiltrate is seen within the posterior aspect of the right lung base. There is no evidence of a pleural effusion or pneumothorax. Upper Abdomen: No acute abnormality. Musculoskeletal: Acute anterior seventh left rib fracture is seen. A chronic posterolateral second left rib fracture is also noted. Multilevel degenerative changes seen throughout the thoracic spine. Review of the MIP images confirms the above findings. IMPRESSION: 1. No evidence of pulmonary embolism. 2. Mild right basilar atelectasis and/or early infiltrate. 3. Evidence of prior median sternotomy/CABG. 4. Anterior seventh left rib fracture. 5. Aortic atherosclerosis. Aortic Atherosclerosis (ICD10-I70.0). Electronically Signed   By: Aram Candela M.D.   On: 02/12/2022 02:02   DG Chest Port 1 View  Result Date: 02/12/2022 CLINICAL DATA:  Recent fall EXAM: PORTABLE CHEST 1 VIEW COMPARISON:  01/21/2022 FINDINGS: Cardiac shadow is enlarged. Postsurgical changes are noted. Lungs are clear bilaterally. No bony abnormality is noted. IMPRESSION: No active disease. Electronically Signed   By: Alcide Clever M.D.   On: 02/12/2022 00:49     Assessment/Plan: 66 year old male with a small tSAH no midline shift or mass effect. Recommend follow  up head CT this morning. Hold coumadin and follow up with [place] in the office next week. Sounds like he still doesn't have answers as to why he is having so many syncopal episodes? No surgical intervention warranted.    Tiana Loft [facility] 02/12/2022 7:54 AM

## 2022-02-12 NOTE — ED Notes (Signed)
Brought patient a recliner and clean gown at this time.

## 2022-02-12 NOTE — Consult Note (Signed)
CARDIOLOGY CONSULT NOTE  Patient ID: Jose [name] MRN: [id number] DOB/AGE: 401/16/1957 66 y.o.  Admit date: 02/12/2022 Referring Physician  Dr Arn Medalondell Primary Physician:  Ephriam JenkinsFowler, Walter E, MD Reason for Consultation  syncope  Patient ID: Jose [name], male    DOB: 06/04/1955, [age] y.o.   MRN: [id number]  Chief Complaint  Patient presents with   Fall    Fall from home, on blood thinners (warfarin), unknown if hit head, A&O x4   HPI:    Jose [name]  is a 66 y.o. male with past medical history significant for CAD status post CABG x3 in 2019, antiphospholipid antibody syndrome, and hypertension who presents to the hospital with syncope.  Patient states he has been having multiple recurrent syncopal episodes for about 2 years now and no underlying reason has been found.  Patient does not really get any sort of warning before he passes out, and he says most of the time it happens from standing position.  Patient does admit that he has passed out a few times even sitting down.  He gets very lightheaded and dizzy and feels like he has vertigo right before it happens.  Of note he was in the hospital 2 weeks ago with similar presentation.  At that time he did undergo echocardiogram which showed severe aortic stenosis and this is the likely cause of his syncopal episodes.  Patient states he did have a tilt table test at the end of September with his prior cardiologist which came back negative.  He states he would like to follow with cardiology here which is closer to his home.  Since this patient has had multiple falls over the last 2 years, he also has history of brain bleeds.  This admission he was noted to have a small subarachnoid hemorrhage which did increase in size slightly on repeat CT scan of the head.  Patient is alert and oriented but his head does hurt.  He has antiphospholipid antibody syndrome but his Coumadin and aspirin are on hold now due to the subarachnoid hemorrhage.  He has already had open  heart surgery so patient would be a good candidate for TAVR.  I discussed with the patient that likely his syncope is due to his severe aortic stenosis and we will begin the work-up for TAVR.  Patient is agreeable to this and is glad we have found a reason for his syncopal episodes.  He denies chest pain, shortness of breath, palpitations, diaphoresis, cyanosis, claudication.  Past Medical History:  Diagnosis Date   Antiphospholipid antibody positive    Antiphospholipid antibody syndrome (HCC)    CAD (coronary artery disease)    CAD S/P percutaneous coronary angioplasty '98, '04, Feb 2017   [facility]   Carotid arterial disease (HCC) 10/2015   moderate bilateral   COPD (chronic obstructive pulmonary disease) (HCC)    Coronary artery disease    DM (diabetes mellitus) (HCC)    HTN (hypertension)    Hypertension    Non-insulin treated type 2 diabetes mellitus (HCC)    OSA (obstructive sleep apnea)    Pulmonary emboli (HCC) 2011   RBBB    Sleep apnea    Vertebrobasilar artery insufficiency    Past Surgical History:  Procedure Laterality Date   APPENDECTOMY     APPLICATION OF WOUND VAC Left 03/18/2019   Procedure: Application Of Wound Vac;  Surgeon: Nadara Mustarduda, Marcus V, MD;  Location[facility];  Service: Orthopedics;  Laterality: Left;   BACK SURGERY     BACK SURGERY  '  89, '06   COLONOSCOPY     CORONARY ANGIOPLASTY WITH STENT PLACEMENT     CORONARY ANGIOPLASTY WITH STENT PLACEMENT  '98, '04, Feb 2017   I & D EXTREMITY Left 03/15/2019   Procedure: DEBRIDEMENT LEFT THIGH;  Surgeon: Duda, Marcus V, MD;  Location: [facility] OR;  Service: Orthopedics;  Laterality: Left;   INCISION AND DRAINAGE OF WOUND Left 03/18/2019   Procedure: LEFT THIGH DEBRIDEMENT;  Surgeon: Duda, Marcus V, MD;  Location: [facility] OR;  Service: Orthopedics;  Laterality: Left;   Social History   Tobacco Use   Smoking status: Former    Packs/day: 0.50    Years: 40.00    Total pack years: 20.00    Types: Cigarettes    Quit date: 01/15/2019     Years since quitting: 3.0   Smokeless tobacco: Never   Tobacco comments:    has started smoking again, willing to quit.  Substance Use Topics   Alcohol use: No    Family History  Problem Relation Age of Onset   CAD Father 72    Marital Status: Married  ROS  Review of Systems  Cardiovascular:  Positive for near-syncope and syncope.  Neurological:  Positive for dizziness, light-headedness and vertigo.   Objective      02/12/2022   10:00 AM 02/12/2022    9:00 AM 02/12/2022    8:46 AM  Vitals with BMI  Height   5' 10"  Weight   235 lbs 14 oz  BMI   33.85  Systolic 125    Diastolic 50    Pulse 80 88     Blood pressure (!) 125/50, pulse 80, temperature 98.9 F (37.2 C), temperature source Oral, resp. rate 13, height 5' 10" (1.778 m), weight 107 kg, SpO2 100 %.    Physical Exam Vitals reviewed.  Constitutional:      General: He is not in acute distress. Neck:     Vascular: Carotid bruit present.     Comments: Murmur radiating to bilateral carotids Cardiovascular:     Rate and Rhythm: Normal rate and regular rhythm.     Pulses: Normal pulses.     Heart sounds: Murmur heard.  Pulmonary:     Effort: Pulmonary effort is normal.     Breath sounds: Normal breath sounds.  Abdominal:     General: Bowel sounds are normal.  Musculoskeletal:     Right lower leg: Edema present.     Left lower leg: Edema present.  Skin:    Comments: Elephantiasis lymphedema bilaterally  Neurological:     Mental Status: He is alert and oriented to person, place, and time.    Laboratory examination:   Recent Labs    02/04/22 0827 02/05/22 0403 02/12/22 0035 02/12/22 0051  NA 143 140 140 139  K 3.8 4.2 4.1 4.1  CL 108 94* 100 104  CO2 23 32 27  --   GLUCOSE 137* 85 94 89  BUN 20 20 14 15  CREATININE 0.84 1.01 0.83 0.90  CALCIUM 8.6* 9.0 9.0  --   GFRNONAA >60 >60 >60  --    estimated creatinine clearance is 98.9 mL/min (by C-G formula based on SCr of 0.9 mg/dL).     Latest  Ref Rng & Units 02/12/2022   12:51 AM 02/12/2022   12:35 AM 02/05/2022    4:03 AM  CMP  Glucose 70 - 99 mg/dL 89  94  85   BUN 8 - 23 mg/dL 15  14  20     Creatinine 0.61 - 1.24 mg/dL 0.56  9.79  4.80   Sodium 135 - 145 mmol/L 139  140  140   Potassium 3.5 - 5.1 mmol/L 4.1  4.1  4.2   Chloride 98 - 111 mmol/L 104  100  94   CO2 22 - 32 mmol/L  27  32   Calcium 8.9 - 10.3 mg/dL  9.0  9.0   Total Protein 6.5 - 8.1 g/dL  7.1    Total Bilirubin 0.3 - 1.2 mg/dL  0.3    Alkaline Phos 38 - 126 U/L  66    AST 15 - 41 U/L  20    ALT 0 - 44 U/L  27        Latest Ref Rng & Units 02/12/2022   12:51 AM 02/12/2022   12:35 AM 02/04/2022    8:27 AM  CBC  WBC 4.0 - 10.5 K/uL  9.3  15.3   Hemoglobin 13.0 - 17.0 g/dL 9.9  9.2  7.9   Hematocrit 39.0 - 52.0 % 29.0  31.3  25.5   Platelets 150 - 400 K/uL  308  323    Lipid Panel Recent Labs    02/02/22 0815 02/04/22 0030  TRIG 191* 133    HEMOGLOBIN A1C Lab Results  Component Value Date   HGBA1C 5.6 02/02/2022   MPG 114.02 02/02/2022   TSH No results for input(s): "TSH" in the last 8760 hours. BNP (last 3 results) Recent Labs    11/05/21 2043 01/21/22 2005 02/02/22 0410  BNP 292.6* 87.4 302.8*   Cardiac Panel (last 3 results) Recent Labs    02/12/22 0035 02/12/22 0311  TROPONINIHS 7 9     Medications and allergies   Allergies  Allergen Reactions   Melatonin Anxiety   Altace [Ramipril] Cough     Current Meds  Medication Sig   albuterol (PROVENTIL) (5 MG/ML) 0.5% nebulizer solution Take 0.5 mLs (2.5 mg total) by nebulization every 6 (six) hours as needed for wheezing or shortness of breath.   albuterol (VENTOLIN HFA) 108 (90 Base) MCG/ACT inhaler Inhale 2 puffs into the lungs every 6 (six) hours as needed for wheezing or shortness of breath.   aspirin EC 81 MG EC tablet Take 1 tablet (81 mg total) by mouth daily. Swallow whole.   DULoxetine (CYMBALTA) 60 MG capsule Take 60 mg by mouth daily. Take one capsule (60 mg)  along with 30 mg capsule to equal 90 mg daily   DULoxetine HCl 30 MG CSDR Take 30 mg by mouth daily.   Fluticasone-Umeclidin-Vilant (TRELEGY ELLIPTA IN) Inhale 1 puff into the lungs daily.   gabapentin (NEURONTIN) 600 MG tablet Take 600 mg by mouth 2 (two) times daily.   glimepiride (AMARYL) 4 MG tablet Take 4 mg by mouth 2 (two) times daily.   ipratropium-albuterol (DUONEB) 0.5-2.5 (3) MG/3ML SOLN Take 3 mLs by nebulization every 4 (four) hours as needed (For shortness of breath or wheezing).   linezolid (ZYVOX) 600 MG tablet Take 1 tablet (600 mg total) by mouth 2 (two) times daily.   metoprolol tartrate (LOPRESSOR) 25 MG tablet Take 1 tab daily for for 4 days then 1 tab every other day for 4 days then stop (Patient taking differently: Take 12.5 mg by mouth 2 (two) times daily.)   nitroGLYCERIN (NITROSTAT) 0.4 MG SL tablet Place 0.4 mg under the tongue every 5 (five) minutes as needed for chest pain.    OXYGEN Inhale 4-6 L into the lungs daily.  pantoprazole (PROTONIX) 40 MG tablet Take 40 mg by mouth 2 (two) times daily.   rosuvastatin (CRESTOR) 10 MG tablet Take 10 mg by mouth every evening.   tamsulosin (FLOMAX) 0.4 MG CAPS capsule Take 1 capsule (0.4 mg total) by mouth at bedtime.   topiramate (TOPAMAX) 50 MG tablet Take 1.5 tablets (75 mg total) by mouth 2 (two) times daily. (Patient taking differently: Take 75 mg by mouth 3 (three) times daily.)   torsemide (DEMADEX) 20 MG tablet Take 20 mg by mouth daily.   warfarin (COUMADIN) 5 MG tablet Take 5 mg by mouth every evening. Take (1mg  - 2mg ) by mouth in addition to (5 mg) as directed by the [facility].   [DISCONTINUED] topiramate (TOPAMAX) 50 MG tablet Take 75 mg by mouth in the morning, at noon, and at bedtime.      No intake/output data recorded. No intake/output data recorded.  Net IO Since Admission: No IO data has been entered for this period [02/12/22 1238]   Radiology:   [facility] IMPRESSION: 1. Small area of subarachnoid  hemorrhage at the superior medial right convexity. 2. No acute fracture or static subluxation of the cervical spine. Critical Value/emergent results were called by telephone at the time of interpretation on 02/12/2022 at 2:07   CT head without contrast 02/12/2022 slight interval increase in size of the small right parafalcine extra-axial hematoma without mass effect or midline shift   Chest x-ray 02/12/2022: No active disease.   CTA PE protocol 02/12/2022: No evidence of PE.  Mild right basilar atelectasis.  Evidence of prior median sternotomy.  Anterior seventh left rib fracture.  Aortic atherosclerosis   Cardiac Studies:   02/03/2022 ECHO IMPRESSIONS    1. Peak gradient of 33 mmHg despite mildly decrease LV stroke volume. DVI 0.26. Gradients were obtained ~ 1 minute after contrast administration with no contrast in the LV cavity. 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 The aortic valve is calcified. There is moderate calcification of the aortic valve. There is moderate thickening of the aortic valve. Aortic valve regurgitation is mild. Severe aortic valve stenosis. Aortic valve area, by VTI measures 0.80 cm. Aortic valve mean gradient measures 33.0 mmHg. Aortic valve Vmax measures 3.66 m/s.   2. Left ventricular ejection fraction, by estimation, is 55 to 60%. The  left ventricle has normal function. The left ventricle has no regional  wall motion abnormalities. The left ventricular internal cavity size was  mildly dilated. Left ventricular diastolic parameters are indeterminate.   3. Right ventricular systolic function was not well visualized. The right  ventricular size is not well visualized.   4. The mitral valve is grossly normal. No evidence of mitral valve regurgitation. No evidence of mitral stenosis.   5. The inferior vena cava is dilated in size with >50% respiratory variability, suggesting right atrial pressure of 8 mmHg.     Tilt table test at [facility]  12/11/2021 Impressions:   Passive and provocative tilt table test (isuprel) with stable BP and HR, no induced vasovagal syncope.  Negative carotid massage.  Stable BP/HR with valsalva maneuver, deep breathing, and cold pressor testing.  Normal postganglionic sympathetic sudomotor axon function by QSWEAT.      03/16/2019 ECHO IMPRESSIONS    1. Left ventricular ejection fraction, by visual estimation, is 55 to 60%. The left ventricle has normal function. There is mildly increased left ventricular hypertrophy.   2. Left ventricular diastolic parameters are consistent with Grade I diastolic dysfunction (impaired relaxation).  3. Global right ventricle was not well visualized.The right ventricular size is not well visualized. Right vetricular wall thickness was not assessed.   4. Left atrial size was not well visualized.   5. Right atrial size was not well visualized.   6. The mitral valve was not well visualized. Trivial mitral valve regurgitation.   7. The tricuspid valve is not well visualized.   8. Aortic valve area, by VTI measures 1.35 cm.   9. Aortic valve mean gradient measures 10.0 mmHg.  10. Aortic valve peak gradient measures 24.6 mmHg.  11. The aortic valve was not well visualized. Aortic valve regurgitation is not visualized. Mild to moderate aortic valve stenosis.  12. The pulmonic valve was not well visualized. Pulmonic valve regurgitation is not visualized.  13. The interatrial septum was not well visualized.  14. Poor endocardial definition, suboptimal echo windows. Patient refused Definity contrast. Study is inadequate to r/o endocarditis. Consider TEE if strong clinical suspicion     09/10/2021 LE Arterial u/s Summary:  Right: Resting right ankle-brachial index indicates mild right lower  extremity arterial disease. The right toe-brachial index is normal.   Left: Resting left ankle-brachial index is within normal range. No  evidence of significant left lower extremity  arterial disease. The left  toe-brachial index is normal.   03/16/2019 LE Arterial u/s Summary:  Right: Resting right ankle-brachial index indicates moderate right lower  extremity arterial disease.   Left: Resting left ankle-brachial index indicates mild left lower  extremity arterial disease.    EKG: 02/12/2022 Sinus rhythm with RBBB and left atrial enlargement. Mildly prolonged Qtc. 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  This is likely cause of patient's syncope  Plan for diagnostic right and left heart cardiac catheterization tomorrow 02/13/2022 with Dr Jacinto Halim at 12pm for pre-TAVR evaluation Patient to be NPO after midnight, pre-cath orders placed TAVR imaging to be done 12/1 in AM prior to cath  TAVR protocol: CT Heart + CT angio Chest + CT angio abd/pelv     CAD s/p CABG x3 in 2019  Restart baby aspirin when ok with neuro  Lopressor, Crestor, and Torsemide restarted with hold parameters in place   Antiphospholipid antibody syndrome with history of PE  Coumadin on hold due to [facility]  CTA PE protocol performed, negative for PE     Clotilde Dieter, DO, [facility] 02/12/2022, 12:38 PM Office: [phone]

## 2022-02-12 NOTE — H&P (Signed)
History and Physical    Patient: Jose [name] ZOX:[id number] DOB: 01-Apr-1955 DOA: 02/12/2022 DOS: the patient was seen and examined on 02/12/2022 PCP: [name], MD  Patient coming from: Home via EMS  Chief Complaint:  Chief Complaint  Patient presents with   Fall    Fall from home, on blood thinners (warfarin), unknown if hit head, A&O x4   HPI: Jose [name] is a 66 y.o. 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  Review of records note that patient had been seen at urgent care on 11/8 after having a fall/syncopal episode at home.  Admitted into the hospital 11/9-11/13 after being found to have a hematoma of the right gluteal subcutaneous fat without signs of active bleeding with drop in hemoglobin down to 7.8(baseline 9.7) transfusion of 1 unit of packed red blood cells.  Patient then admitted 11/20-11/24 after having a fall/syncopal episode at home acutely altered requiring intubation and admitted into the ICU with concern for aspiration pneumonia.  Tracheal aspirate grew MRSA for which patient was transition to linezolid to treat for total 7 days patient had self extubated his self on 11/22.  After patient was noted to be stable he reports he felt like he was transferred to the floor and discharged home the next day despite neurology recommending him to be formally evaluated by cardiology at that time.  He is only been home 4 days and it took 2 days to obtain the linezolid medication from the pharmacy.  He reports having 2 more doses left to take to complete the 7-day course recommended.  Over the last 24 hours he had had 2 separate syncopal events in the house.   The first 1 his wife notes that she was able to get him up from the floor.  However second 1 that occurred yesterday he fell in between the countertop and the refrigerator leaving a dent there.  Patient remembers walking to the fridge prior to coming to the hospital.  Patient was bleeding from his nose and ultimately she called 911 the patient evaluated.  Upon into the emergency department patient was seen as a level afebrile with pulse 74-107, respirations 13-25, blood pressure 102/45-147/71, and O2 saturations currently maintained on home 4 L of oxygen.  Patient underwent CT scan of the head, cervical spine, chest, abdomen, pelvis, and lumbar spine.  CT scan of the head noted small area of subarachnoid hemorrhage at the superior medial right convexity.  Labs were noted to be stable.  Neurosurgery have been consulted and recommended serial CT exams.  The next CT scan of the brain noted slight interval increase in size of the small subarachnoid hemorrhage without mass effect or midline shift.  Cardiology has been consulted as well for pain syncope.    Review of Systems: As mentioned in the history of present illness. All other systems reviewed and are negative. Past Medical History:  Diagnosis Date   Antiphospholipid antibody positive    Antiphospholipid antibody syndrome (HCC)    CAD (coronary artery disease)    CAD S/P percutaneous coronary angioplasty '98, '04, Feb 2017   [facility]   Carotid arterial disease (HCC) 10/2015   moderate bilateral   COPD (chronic obstructive pulmonary disease) (  HCC)    Coronary artery disease    DM (diabetes mellitus) (HCC)    HTN (hypertension)    Hypertension    Non-insulin treated type 2 diabetes mellitus (HCC)    OSA (obstructive sleep apnea)    Pulmonary emboli (HCC) 2011   [facility]BBB    Sleep apnea    Vertebrobasilar artery insufficiency    Past Surgical History:  Procedure Laterality Date   APPENDECTOMY     APPLICATION OF WOUND VAC Left 03/18/2019    Procedure: Application Of Wound Vac;  Surgeon: Nadara Mustarduda, Marcus V, MD;  [facility];  Service: Orthopedics;  Laterality: Left;   BACK SURGERY     BACK SURGERY  '89, '06   COLONOSCOPY     CORONARY ANGIOPLASTY WITH STENT PLACEMENT     CORONARY ANGIOPLASTY WITH STENT PLACEMENT  '98, '04, Feb 2017   I & D EXTREMITY Left 03/15/2019   Procedure: DEBRIDEMENT LEFT THIGH;  Surgeon: Nadara Mustarduda, Marcus V, MD;  [facility];  Service: Orthopedics;  Laterality: Left;   INCISION AND DRAINAGE OF WOUND Left 03/18/2019   Procedure: LEFT THIGH DEBRIDEMENT;  Surgeon: Nadara Mustarduda, Marcus V, MD;  [facility];  Service: Orthopedics;  Laterality: Left;   Social History:  reports that he quit smoking about 3 years ago. His smoking use included cigarettes. He has a 20.00 pack-year smoking history. He has never used smokeless tobacco. He reports that he does not drink alcohol and does not use drugs.  Allergies  Allergen Reactions   Melatonin Anxiety   Altace [Ramipril] Cough    Family History  Problem Relation Age of Onset   CAD Father 1172    Prior to Admission medications   Medication Sig Start Date End Date Taking? Authorizing Provider  acetaminophen (TYLENOL) 500 MG tablet Take 1,000 mg by mouth every 6 (six) hours as needed for mild pain.    [provider]  acetaminophen (TYLENOL) 650 MG CR tablet Take 1,950 mg by mouth every 8 (eight) hours as needed for pain.    [provider]  albuterol (PROVENTIL) (5 MG/ML) 0.5% nebulizer solution Take 0.5 mLs (2.5 mg total) by nebulization every 6 (six) hours as needed for wheezing or shortness of breath. 12/27/18   Terald Sleeperrifan, Matthew J, MD  albuterol (VENTOLIN HFA) 108 (90 Base) MCG/ACT inhaler Inhale 2 puffs into the lungs every 6 (six) hours as needed for wheezing or shortness of breath.    [provider]  albuterol (VENTOLIN HFA) 108 (90 Base) MCG/ACT inhaler Inhale 2 puffs into the lungs every 6 (six) hours as needed for wheezing or shortness of  breath. 01/06/22   [provider]  alfuzosin (UROXATRAL) 10 MG 24 hr tablet Take 10 mg by mouth daily with breakfast. Alfuzosin Hydrochloride Extend Release 10mg     [provider]  aspirin EC 81 MG EC tablet Take 1 tablet (81 mg total) by mouth daily. Swallow whole. 01/23/20   Dagar, Geralynn RileAnjali, MD  aspirin EC 81 MG tablet Take 81 mg by mouth daily. Swallow whole.    [provider]  DULoxetine (CYMBALTA) 30 MG capsule Take 30 mg by mouth daily. Take one capsule (30 mg) daily along with 60 mg capsule to equal 90 mg daily 11/02/21   [provider]  DULoxetine (CYMBALTA) 60 MG capsule Take 60 mg by mouth daily. Take one capsule (60 mg) along with 30 mg capsule to equal 90 mg daily    [provider]  DULoxetine (CYMBALTA) 60 MG capsule Take  60 mg by mouth daily. 11/02/21   [provider]  DULoxetine HCl 30 MG CSDR Take 30 mg by mouth daily. Patient not taking: Reported on 02/02/2022 11/02/21   [provider]  enoxaparin (LOVENOX) 120 MG/0.8ML injection Inject 0.74 mLs (111 mg total) into the skin every 12 (twelve) hours for 4 days. 01/26/22 01/30/22  Amin, Loura Halt, MD  enoxaparin (LOVENOX) 120 MG/0.8ML injection Inject into the skin. Patient not taking: Reported on 02/02/2022 01/27/22   [provider]  Fluticasone-Umeclidin-Vilant (TRELEGY ELLIPTA IN) Inhale 1 puff into the lungs daily.    [provider]  Fluticasone-Umeclidin-Vilant (TRELEGY ELLIPTA) 200-62.5-25 MCG/ACT AEPB Inhale 1 puff into the lungs daily.    [provider]  gabapentin (NEURONTIN) 600 MG tablet Take 600 mg by mouth 3 (three) times daily.    [provider]  gabapentin (NEURONTIN) 600 MG tablet Take 600 mg by mouth 2 (two) times daily. 09/02/21   [provider]  glimepiride (AMARYL) 4 MG tablet Take 4 mg by mouth 2 (two) times daily. 12/04/20   [provider]  glimepiride (AMARYL) 4 MG tablet Take 4 mg by mouth  2 (two) times daily. 10/07/21   [provider]  ipratropium-albuterol (DUONEB) 0.5-2.5 (3) MG/3ML SOLN Take 3 mLs by nebulization every 4 (four) hours as needed (For shortness of breath or wheezing).    [provider]  linezolid (ZYVOX) 600 MG tablet Take 1 tablet (600 mg total) by mouth 2 (two) times daily. 02/06/22   Wouk, Wilfred Curtis, MD  metoprolol tartrate (LOPRESSOR) 12.5 mg TABS tablet Take 12.5 mg by mouth 2 (two) times daily.    [provider]  metoprolol tartrate (LOPRESSOR) 25 MG tablet Take 1 tab daily for for 4 days then 1 tab every other day for 4 days then stop 02/06/22   Wouk, Wilfred Curtis, MD  nitroGLYCERIN (NITROSTAT) 0.4 MG SL tablet Place 0.4 mg under the tongue every 5 (five) minutes as needed for chest pain.  07/07/16   [provider]  OXYGEN Inhale 4-6 L into the lungs daily.    [provider]  pantoprazole (PROTONIX) 40 MG tablet Take 40 mg by mouth 2 (two) times daily.    [provider]  pantoprazole (PROTONIX) 40 MG tablet Take 40 mg by mouth 2 (two) times daily. 12/05/21   [provider]  rosuvastatin (CRESTOR) 10 MG tablet Take 10 mg by mouth every evening. 07/07/16   [provider]  rosuvastatin (CRESTOR) 10 MG tablet Take 10 mg by mouth daily. 01/06/22   [provider]  senna-docusate (SENOKOT-S) 8.6-50 MG tablet Take 1 tablet by mouth 2 (two) times daily as needed for moderate constipation. 01/26/22   Amin, Loura Halt, MD  sildenafil (REVATIO) 20 MG tablet Take 20 mg by mouth 3 (three) times daily.    [provider]  tamsulosin (FLOMAX) 0.4 MG CAPS capsule Take 0.8 mg by mouth daily.    [provider]  tamsulosin (FLOMAX) 0.4 MG CAPS capsule Take 1 capsule (0.4 mg total) by mouth at bedtime. 02/06/22   Wouk, Wilfred Curtis, MD  topiramate (TOPAMAX) 50 MG tablet Take 75 mg by mouth in the morning, at noon, and at bedtime.    [provider]  topiramate  (TOPAMAX) 50 MG tablet Take 1.5 tablets (75 mg total) by mouth 2 (two) times daily. 02/06/22   Wouk, Wilfred Curtis, MD  torsemide (DEMADEX) 20 MG tablet Take 20 mg by mouth daily.  [provider]  torsemide (DEMADEX) 20 MG tablet Take 20 mg by mouth daily.    [provider]  warfarin (COUMADIN) 1 MG tablet See admin instructions. Take one to two tablets (1-2mg ) by mouth as directed by the [facility] in addition to  tablets  (substituting Warfarin) 08/24/21   [provider]  warfarin (COUMADIN) 5 MG tablet See admin instructions. Take (  - ) by mouth in addition to (5 mg) as directed by the [facility].    [provider]  warfarin (COUMADIN) 7.5 MG tablet Take 1 tablet (7.5 mg total) by mouth See admin instructions. Take one tablet (5 mg) Tuesday, Wednesday, Thursday, Saturday and Sunday Then take (6 mg) Monday and Friday  Currently wanting to do  everyday to maintain phospholipid syndrome. 01/26/22   Dimple Nanas, MD    Physical Exam: Vitals:   02/12/22 0846 02/12/22 0900 02/12/22 0930 02/12/22 1000  BP:    (!) 125/50  Pulse:  88  80  Resp:  17  13  Temp:      TempSrc:      SpO2:  99% 90% 100%  Weight: 107 kg     Height:  (1.778 m)      Constitutional: Obese male currently in no acute distress Eyes: PERRL, lids and conjunctivae normal ENMT: Mucous membranes are moist.  Neck: normal, supple, no masses, no thyromegaly Respiratory: Decreased overall aeration without significant wheezes or rhonchi appreciated.  Patient on O2 saturation maintained. Cardiovascular: Regular rate and rhythm, positive murmur present.  Patient with at least +1 pitting bilateral lower EXTR edema. Abdomen: Protuberant abdomen without tenderness to palpation, no masses palpated.  Bowel sounds positive.  Musculoskeletal: no clubbing / cyanosis. No joint deformity upper and lower extremities. Good ROM, no contractures. Normal muscle tone.  Skin:  Venous stasis changes noted of the bilateral lower extremities. Neurologic: CN 2-12 grossly intact. Strength 5/5 in all 4.  Psychiatric: Normal judgment and insight. Alert and oriented x 3. Normal mood.    Data Reviewed:  Reviewed labs, imaging, and pertinent records as noted above in HPI  Assessment and Plan:  Traumatic subarachnoid hemorrhage secondary to syncope and collapse Patient found to have a small subarachnoid hemorrhage after having multiple syncopal episodes at home. Repeat CT scan showing slight interval increase in size of the small right possible and extra-axial hematoma without signs of mass effect.  They recommended patient to follow-up with them in outpatient clinic setting in 1 week. -Admit to a progressive bed -Neurochecks -Hold aspirin and Coumadin -Appreciate neurosurgery consultative services  Recurrent syncopal episodes secondary to severe aortic stenosis Acute.  Patient with recurrent syncopal episodes.  Previously thought to vertebrobasilar insufficiency.  Cardiology consulted and feel patient's symptoms secondary to severe aortic stenosis.  Plan is for coronary CTA and left heart cath in a.m. -N.p.o. for procedure in a.m. -Appreciate cardiology consultative services  COPD with chronic respiratory failure with hypoxia and hypercapnia At baseline patient is on 4 to 6 L of nasal cannula oxygen at all times.  He is supposed to wear trilogy BiPAP at home. -Continue nasal cannula oxygen to maintain O2 saturations -Continue home inhaler and breathing treatments as needed  Essential hypertension -Continue beta-blocker as tolerated  Aspiration pneumonia Patient had been intubated after recent fall on 11/20.  Initial concern for aspiration pneumonia where tracheal aspirates grew out MRSA.  Patient had been discharged home on a 7-day course of linezolid.  There was a 2-day delay in getting the  medication from the scene patient has 2 doses left to complete  course. -Complete last 2 doses of linezolid per prior recommendations from recent hospitalization  Normocytic anemia Chronic.  Hemoglobin appears stable at 9.2 g/dL.  History of PE 2011 Hx of Antiphospholipid antibody syndrome on chronic anticoagulation Patient Coumadin currently on hold due to subarachnoid hemorrhage.  Prior INR goal was 2 due to recurrent falls. -Discussed with neurosurgery regarding restarting anticoagulation or possible bridging in the interim given history  CAD Patient with prior history of PCI stent, CABG x 3. -Continue statin and beta-blocker -Holding aspirin due to bleed -Restart baby aspirin when okay by surgery  Controlled diabetes mellitus type 2, without long-term use of insulin Last hemoglobin A1c was noted to be 5.6 on 11/20.  Home medication includes glimepiride. -Heart healthy and carb modified diet -Hold glimepiride -Start sliding scale insulin if deemed medically appropriatewhile in [place].  BPH -Continue Flomax  Sleep apnea Patient declined need to use his trilogy BiPAP machine while in the hospital, but wife states she will get it if needed  Obesity BMI 36.95 kg/m  DVT prophylaxis: SCDs Advance Care Planning:   Code Status: Full Code   Consults: Cardiology and neurosurgery  Family Communication: Wife updated at bedside  Severity of Illness: The appropriate patient status for this patient is OBSERVATION. Observation status is judged to be reasonable and necessary in order to provide the required intensity of service to ensure the patient's safety. The patient's presenting symptoms, physical exam findings, and initial radiographic and laboratory data in the context of their medical condition is felt to place them at decreased risk for further clinical deterioration. Furthermore, it is anticipated that the patient will be medically stable for discharge from the hospital within 2 midnights of admission.   Author: Clydie Braun,  MD 02/12/2022 11:08 AM  For on call review [email or web address].

## 2022-02-12 NOTE — Progress Notes (Signed)
Orthopedic Tech Progress Note Patient Details:  Jose [name] 01-17-1956 [id number]  Patient ID: Jose [name], male   DOB: 1956-03-02, 66 y.o.   MRN: [id number] Level II; not currently needed. Jose [name] 02/12/2022, 12:14 AM

## 2022-02-12 NOTE — H&P (View-Only) (Signed)
CARDIOLOGY CONSULT NOTE  Patient ID: Jose BaliJeffrey [name] MRN: [id number] DOB/AGE: 401/16/1957 66 y.o.  Admit date: 02/12/2022 Referring Physician  Dr Arn Medalondell Primary Physician:  Ephriam JenkinsFowler, Walter E, MD Reason for Consultation  syncope  Patient ID: Jose BaliJeffrey [name], male    DOB: 04/24/1955, [age] y.o.   MRN: [id number]  Chief Complaint  Patient presents with   Fall    Fall from home, on blood thinners (warfarin), unknown if hit head, A&O x4   HPI:    Jose BaliJeffrey [name]  is a 66 y.o. male with past medical history significant for CAD status post CABG x3 in 2019, antiphospholipid antibody syndrome, and hypertension who presents to the hospital with syncope.  Patient states he has been having multiple recurrent syncopal episodes for about 2 years now and no underlying reason has been found.  Patient does not really get any sort of warning before he passes out, and he says most of the time it happens from standing position.  Patient does admit that he has passed out a few times even sitting down.  He gets very lightheaded and dizzy and feels like he has vertigo right before it happens.  Of note he was in the hospital 2 weeks ago with similar presentation.  At that time he did undergo echocardiogram which showed severe aortic stenosis and this is the likely cause of his syncopal episodes.  Patient states he did have a tilt table test at the end of September with his prior cardiologist which came back negative.  He states he would like to follow with cardiology here which is closer to his home.  Since this patient has had multiple falls over the last 2 years, he also has history of brain bleeds.  This admission he was noted to have a small subarachnoid hemorrhage which did increase in size slightly on repeat CT scan of the head.  Patient is alert and oriented but his head does hurt.  He has antiphospholipid antibody syndrome but his Coumadin and aspirin are on hold now due to the subarachnoid hemorrhage.  He has already had open  heart surgery so patient would be a good candidate for TAVR.  I discussed with the patient that likely his syncope is due to his severe aortic stenosis and we will begin the work-up for TAVR.  Patient is agreeable to this and is glad we have found a reason for his syncopal episodes.  He denies chest pain, shortness of breath, palpitations, diaphoresis, cyanosis, claudication.  Past Medical History:  Diagnosis Date   Antiphospholipid antibody positive    Antiphospholipid antibody syndrome (HCC)    CAD (coronary artery disease)    CAD S/P percutaneous coronary angioplasty '98, '04, Feb 2017   [facility]   Carotid arterial disease (HCC) 10/2015   moderate bilateral   COPD (chronic obstructive pulmonary disease) (HCC)    Coronary artery disease    DM (diabetes mellitus) (HCC)    HTN (hypertension)    Hypertension    Non-insulin treated type 2 diabetes mellitus (HCC)    OSA (obstructive sleep apnea)    Pulmonary emboli (HCC) 2011   RBBB    Sleep apnea    Vertebrobasilar artery insufficiency    Past Surgical History:  Procedure Laterality Date   APPENDECTOMY     APPLICATION OF WOUND VAC Left 03/18/2019   Procedure: Application Of Wound Vac;  Surgeon: Nadara Mustarduda, Marcus V, MD;  Location: [facility] OR;  Service: Orthopedics;  Laterality: Left;   BACK SURGERY     BACK SURGERY  '  41, '06   COLONOSCOPY     CORONARY ANGIOPLASTY WITH STENT PLACEMENT     CORONARY ANGIOPLASTY WITH STENT PLACEMENT  '98, '04, Feb 2017   I & D EXTREMITY Left 03/15/2019   Procedure: DEBRIDEMENT LEFT THIGH;  Surgeon: Nadara Mustard, MD;  Location: [facility] OR;  Service: Orthopedics;  Laterality: Left;   INCISION AND DRAINAGE OF WOUND Left 03/18/2019   Procedure: LEFT THIGH DEBRIDEMENT;  Surgeon: Nadara Mustard, MD;  Location: [facility] OR;  Service: Orthopedics;  Laterality: Left;   Social History   Tobacco Use   Smoking status: Former    Packs/day: 0.50    Years: 40.00    Total pack years: 20.00    Types: Cigarettes    Quit date: 01/15/2019     Years since quitting: 3.0   Smokeless tobacco: Never   Tobacco comments:    has started smoking again, willing to quit.  Substance Use Topics   Alcohol use: No    Family History  Problem Relation Age of Onset   CAD Father 56    Marital Status: Married  ROS  Review of Systems  Cardiovascular:  Positive for near-syncope and syncope.  Neurological:  Positive for dizziness, light-headedness and vertigo.   Objective      02/12/2022   10:00 AM 02/12/2022    9:00 AM 02/12/2022    8:46 AM  Vitals with BMI  Height     Weight   235 lbs 14 oz  BMI   33.85  Systolic 125    Diastolic 50    Pulse 80 88     Blood pressure (!) 125/50, pulse 80, temperature 98.9 F (37.2 C), temperature source Oral, resp. rate 13, height  (1.778 m), weight 107 kg, SpO2 100 %.    Physical Exam Vitals reviewed.  Constitutional:      General: He is not in acute distress. Neck:     Vascular: Carotid bruit present.     Comments: Murmur radiating to bilateral carotids Cardiovascular:     Rate and Rhythm: Normal rate and regular rhythm.     Pulses: Normal pulses.     Heart sounds: Murmur heard.  Pulmonary:     Effort: Pulmonary effort is normal.     Breath sounds: Normal breath sounds.  Abdominal:     General: Bowel sounds are normal.  Musculoskeletal:     Right lower leg: Edema present.     Left lower leg: Edema present.  Skin:    Comments: Elephantiasis lymphedema bilaterally  Neurological:     Mental Status: He is alert and oriented to person, place, and time.    Laboratory examination:   Recent Labs    02/04/22 0827 02/05/22 0403 02/12/22 0035 02/12/22 0051  NA 143 140 140 139  K 3.8 4.2 4.1 4.1  CL 108 94* 100 104  CO2 23 32 27  --   GLUCOSE 137* 85 94 89  BUN CREATININE 0.84 1.01 0.83 0.90  CALCIUM 8.6* 9.0 9.0  --   GFRNONAA >60 >60 >60  --    estimated creatinine clearance is 98.9 mL/min (by C-G formula based on SCr of 0.9 mg/dL).     Latest  Ref Rng & Units 02/12/2022   12:51 AM 02/12/2022   12:35 AM 02/05/2022    4:03 AM  CMP  Glucose 70 - 99 mg/dL 89  94  85   BUN 8 - 23 mg/dL 15  14  20  Creatinine 0.61 - 1.24 mg/dL 0.56  9.79  4.80   Sodium 135 - 145 mmol/L 139  140  140   Potassium 3.5 - 5.1 mmol/L 4.1  4.1  4.2   Chloride 98 - 111 mmol/L 104  100  94   CO2 22 - 32 mmol/L  27  32   Calcium 8.9 - 10.3 mg/dL  9.0  9.0   Total Protein 6.5 - 8.1 g/dL  7.1    Total Bilirubin 0.3 - 1.2 mg/dL  0.3    Alkaline Phos 38 - 126 U/L  66    AST 15 - 41 U/L  20    ALT 0 - 44 U/L  27        Latest Ref Rng & Units 02/12/2022   12:51 AM 02/12/2022   12:35 AM 02/04/2022    8:27 AM  CBC  WBC 4.0 - 10.5 K/uL  9.3  15.3   Hemoglobin 13.0 - 17.0 g/dL 9.9  9.2  7.9   Hematocrit 39.0 - 52.0 % 29.0  31.3  25.5   Platelets 150 - 400 K/uL  308  323    Lipid Panel Recent Labs    02/02/22 0815 02/04/22 0030  TRIG 191* 133    HEMOGLOBIN A1C Lab Results  Component Value Date   HGBA1C 5.6 02/02/2022   MPG 114.02 02/02/2022   TSH No results for input(s): "TSH" in the last 8760 hours. BNP (last 3 results) Recent Labs    11/05/21 2043 01/21/22 2005 02/02/22 0410  BNP 292.6* 87.4 302.8*   Cardiac Panel (last 3 results) Recent Labs    02/12/22 0035 02/12/22 0311  TROPONINIHS 7 9     Medications and allergies   Allergies  Allergen Reactions   Melatonin Anxiety   Altace [Ramipril] Cough     Current Meds  Medication Sig   albuterol (PROVENTIL) (5 MG/ML) 0.5% nebulizer solution Take 0.5 mLs (2.5 mg total) by nebulization every 6 (six) hours as needed for wheezing or shortness of breath.   albuterol (VENTOLIN HFA) 108 (90 Base) MCG/ACT inhaler Inhale 2 puffs into the lungs every 6 (six) hours as needed for wheezing or shortness of breath.   aspirin EC 81 MG EC tablet Take 1 tablet (81 mg total) by mouth daily. Swallow whole.   DULoxetine (CYMBALTA) 60 MG capsule Take 60 mg by mouth daily. Take one capsule (60 mg)  along with 30 mg capsule to equal 90 mg daily   DULoxetine HCl 30 MG CSDR Take 30 mg by mouth daily.   Fluticasone-Umeclidin-Vilant (TRELEGY ELLIPTA IN) Inhale 1 puff into the lungs daily.   gabapentin (NEURONTIN) 600 MG tablet Take 600 mg by mouth 2 (two) times daily.   glimepiride (AMARYL) 4 MG tablet Take 4 mg by mouth 2 (two) times daily.   ipratropium-albuterol (DUONEB) 0.5-2.5 (3) MG/3ML SOLN Take 3 mLs by nebulization every 4 (four) hours as needed (For shortness of breath or wheezing).   linezolid (ZYVOX) 600 MG tablet Take 1 tablet (600 mg total) by mouth 2 (two) times daily.   metoprolol tartrate (LOPRESSOR) 25 MG tablet Take 1 tab daily for for 4 days then 1 tab every other day for 4 days then stop (Patient taking differently: Take 12.5 mg by mouth 2 (two) times daily.)   nitroGLYCERIN (NITROSTAT) 0.4 MG SL tablet Place 0.4 mg under the tongue every 5 (five) minutes as needed for chest pain.    OXYGEN Inhale 4-6 L into the lungs daily.  pantoprazole (PROTONIX) 40 MG tablet Take 40 mg by mouth 2 (two) times daily.   rosuvastatin (CRESTOR) 10 MG tablet Take 10 mg by mouth every evening.   tamsulosin (FLOMAX) 0.4 MG CAPS capsule Take 1 capsule (0.4 mg total) by mouth at bedtime.   topiramate (TOPAMAX) 50 MG tablet Take 1.5 tablets (75 mg total) by mouth 2 (two) times daily. (Patient taking differently: Take 75 mg by mouth 3 (three) times daily.)   torsemide (DEMADEX) 20 MG tablet Take 20 mg by mouth daily.   warfarin (COUMADIN) 5 MG tablet Take 5 mg by mouth every evening. Take (1mg  - 2mg ) by mouth in addition to (5 mg) as directed by the [facility].   [DISCONTINUED] topiramate (TOPAMAX) 50 MG tablet Take 75 mg by mouth in the morning, at noon, and at bedtime.      No intake/output data recorded. No intake/output data recorded.  Net IO Since Admission: No IO data has been entered for this period [02/12/22 1238]   Radiology:   [facility] IMPRESSION: 1. Small area of subarachnoid  hemorrhage at the superior medial right convexity. 2. No acute fracture or static subluxation of the cervical spine. Critical Value/emergent results were called by telephone at the time of interpretation on 02/12/2022 at 2:07   CT head without contrast 02/12/2022 slight interval increase in size of the small right parafalcine extra-axial hematoma without mass effect or midline shift   Chest x-ray 02/12/2022: No active disease.   CTA PE protocol 02/12/2022: No evidence of PE.  Mild right basilar atelectasis.  Evidence of prior median sternotomy.  Anterior seventh left rib fracture.  Aortic atherosclerosis   Cardiac Studies:   02/03/2022 ECHO IMPRESSIONS    1. Peak gradient of 33 mmHg despite mildly decrease LV stroke volume. DVI 0.26. Gradients were obtained ~ 1 minute after contrast administration with no contrast in the LV cavity. 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 The aortic valve is calcified. There is moderate calcification of the aortic valve. There is moderate thickening of the aortic valve. Aortic valve regurgitation is mild. Severe aortic valve stenosis. Aortic valve area, by VTI measures 0.80 cm. Aortic valve mean gradient measures 33.0 mmHg. Aortic valve Vmax measures 3.66 m/s.   2. Left ventricular ejection fraction, by estimation, is 55 to 60%. The  left ventricle has normal function. The left ventricle has no regional  wall motion abnormalities. The left ventricular internal cavity size was  mildly dilated. Left ventricular diastolic parameters are indeterminate.   3. Right ventricular systolic function was not well visualized. The right  ventricular size is not well visualized.   4. The mitral valve is grossly normal. No evidence of mitral valve regurgitation. No evidence of mitral stenosis.   5. The inferior vena cava is dilated in size with >50% respiratory variability, suggesting right atrial pressure of 8 mmHg.     Tilt table test at [facility]  12/11/2021 Impressions:   Passive and provocative tilt table test (isuprel) with stable BP and HR, no induced vasovagal syncope.  Negative carotid massage.  Stable BP/HR with valsalva maneuver, deep breathing, and cold pressor testing.  Normal postganglionic sympathetic sudomotor axon function by QSWEAT.      03/16/2019 ECHO IMPRESSIONS    1. Left ventricular ejection fraction, by visual estimation, is 55 to 60%. The left ventricle has normal function. There is mildly increased left ventricular hypertrophy.   2. Left ventricular diastolic parameters are consistent with Grade I diastolic dysfunction (impaired relaxation).  3. Global right ventricle was not well visualized.The right ventricular size is not well visualized. Right vetricular wall thickness was not assessed.   4. Left atrial size was not well visualized.   5. Right atrial size was not well visualized.   6. The mitral valve was not well visualized. Trivial mitral valve regurgitation.   7. The tricuspid valve is not well visualized.   8. Aortic valve area, by VTI measures 1.35 cm.   9. Aortic valve mean gradient measures 10.0 mmHg.  10. Aortic valve peak gradient measures 24.6 mmHg.  11. The aortic valve was not well visualized. Aortic valve regurgitation is not visualized. Mild to moderate aortic valve stenosis.  12. The pulmonic valve was not well visualized. Pulmonic valve regurgitation is not visualized.  13. The interatrial septum was not well visualized.  14. Poor endocardial definition, suboptimal echo windows. Patient refused Definity contrast. Study is inadequate to r/o endocarditis. Consider TEE if strong clinical suspicion     09/10/2021 LE Arterial u/s Summary:  Right: Resting right ankle-brachial index indicates mild right lower  extremity arterial disease. The right toe-brachial index is normal.   Left: Resting left ankle-brachial index is within normal range. No  evidence of significant left lower extremity  arterial disease. The left  toe-brachial index is normal.   03/16/2019 LE Arterial u/s Summary:  Right: Resting right ankle-brachial index indicates moderate right lower  extremity arterial disease.   Left: Resting left ankle-brachial index indicates mild left lower  extremity arterial disease.    EKG: 02/12/2022 Sinus rhythm with RBBB and left atrial enlargement. Mildly prolonged Qtc. 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  This is likely cause of patient's syncope  Plan for diagnostic right and left heart cardiac catheterization tomorrow 02/13/2022 with Dr Jacinto Halim at 12pm for pre-TAVR evaluation Patient to be NPO after midnight, pre-cath orders placed TAVR imaging to be done 12/1 in AM prior to cath  TAVR protocol: CT Heart + CT angio Chest + CT angio abd/pelv     CAD s/p CABG x3 in 2019  Restart baby aspirin when ok with neuro  Lopressor, Crestor, and Torsemide restarted with hold parameters in place   Antiphospholipid antibody syndrome with history of PE  Coumadin on hold due to [facility]  CTA PE protocol performed, negative for PE     Clotilde Dieter, DO, [facility] 02/12/2022, 12:38 PM Office: [phone]

## 2022-02-12 NOTE — ED Notes (Addendum)
This writer went in to reconnect pt to monitor and patient stated that we was uncomfortable and wanted a recliner. I stated that that's not something we typically do and patient became visibly upset. Stated " Do you know who I am? When I come here, I get a recliner when I want one. Get me your supervisor. RN notified.

## 2022-02-12 NOTE — ED Notes (Signed)
Patient transported to CT with trauma RN °

## 2022-02-12 NOTE — TOC CAGE-AID Note (Signed)
Transition of Care [facility]) - CAGE-AID Screening   Patient Details  Name: Jose [name] MRN: [id number] Date of Birth: 1956-03-03  Transition of Care (TOC) CM/SW Contact:    Katha Hamming, RN Phone [phone] 02/12/2022, 10:26 PM    CAGE-AID Screening:    Have You Ever Felt You Ought to Cut Down on Your Drinking or Drug Use?: No Have People Annoyed You By Critizing Your Drinking Or Drug Use?: No Have You Felt Bad Or Guilty About Your Drinking Or Drug Use?: No Have You Ever Had a Drink or Used Drugs First Thing In The Morning to Steady Your Nerves or to Get Rid of a Hangover?: No CAGE-AID Score: 0  Substance Abuse Education Offered: No (no hx of alcohol or drug use, no resources indicated)

## 2022-02-12 NOTE — ED Provider Notes (Addendum)
[facility] Emergency Department Provider Note MRN:  [id number]  Arrival date & time: 02/12/22     Chief Complaint   Fall (Fall from home, on blood thinners (warfarin), unknown if hit head, A&O x4)   History of Present Illness   Jose [name] is a 66 y.o. year-old male with a history of CAD, antiphospholipid syndrome, PE presenting to the ED with chief complaint of syncope.  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  Has had multiple falls due to syncope over the past few days, syncope without warning.  Happened again this evening, EMS was called.  Question of head trauma, takes blood thinners, arriving as a level 2 trauma.  Review of Systems  A thorough review of systems was obtained and all systems are negative except as noted in the HPI and PMH.   Patient's Health History    Past Medical History:  Diagnosis Date   Antiphospholipid antibody positive    Antiphospholipid antibody syndrome (HCC)    CAD (coronary artery disease)    CAD S/P percutaneous coronary angioplasty '98, '04, Feb 2017   [facility]   Carotid arterial disease ([place]) 10/2015   moderate bilateral   COPD (chronic obstructive pulmonary disease) (HCC)    Coronary artery disease    DM (diabetes mellitus) ([place])    HTN (hypertension)    Hypertension    Non-insulin treated type 2 diabetes mellitus ([place])    OSA (obstructive sleep apnea)    Pulmonary emboli ([place]) 2011   RBBB    Sleep apnea    Vertebrobasilar artery insufficiency     Past Surgical History:  Procedure Laterality Date   APPENDECTOMY     APPLICATION OF WOUND VAC Left 03/18/2019   Procedure: Application Of Wound Vac;  Surgeon: Newt Minion, MD;  Location: [place];  Service: Orthopedics;  Laterality: Left;   BACK SURGERY     BACK SURGERY  '89, '06   COLONOSCOPY     CORONARY ANGIOPLASTY WITH STENT PLACEMENT     CORONARY ANGIOPLASTY WITH STENT PLACEMENT  '98, '04, Feb 2017   I & D  EXTREMITY Left 03/15/2019   Procedure: DEBRIDEMENT LEFT THIGH;  Surgeon: Newt Minion, MD;  Location: [place];  Service: Orthopedics;  Laterality: Left;   INCISION AND DRAINAGE OF WOUND Left 03/18/2019   Procedure: LEFT THIGH DEBRIDEMENT;  Surgeon: Newt Minion, MD;  Location: [place];  Service: Orthopedics;  Laterality: Left;    Family History  Problem Relation Age of Onset   CAD Father 23    Social History   Socioeconomic History   Marital status: Married    Spouse name: Not on file   Number of children: Not on file   Years of education: Not on file   Highest education level: Not on file  Occupational History   Not on file  Tobacco Use   Smoking status: Former    Packs/day: 0.50    Years: 40.00    Total pack years: 20.00    Types: Cigarettes    Quit date: 01/15/2019    Years since quitting: 3.0   Smokeless tobacco: Never   Tobacco comments:    has started smoking again, willing to quit.  Vaping Use   Vaping Use: Never used  Substance and Sexual Activity   Alcohol use: No   Drug use: No   Sexual activity: Yes  Other Topics Concern   Not on file  Social History Narrative   **  Merged History Encounter **       Social Determinants of Health   Financial Resource Strain: Not on file  Food Insecurity: Not on file  Transportation Needs: Not on file  Physical Activity: Not on file  Stress: Not on file  Social Connections: Not on file  Intimate Partner Violence: Not on file     Physical Exam   Vitals:   02/12/22 0445 02/12/22 0500  BP: 105/67 120/62  Pulse: 88 80  Resp:  20  Temp:    SpO2: 100% 100%    CONSTITUTIONAL: Well-appearing, NAD NEURO/PSYCH:  Alert and oriented x 3, no focal deficits EYES:  eyes equal and reactive ENT/NECK:  no LAD, no JVD CARDIO: Regular rate, well-perfused, normal S1 and S2 PULM:  CTAB no wheezing or rhonchi GI/GU:  non-distended, non-tender MSK/SPINE:  No gross deformities, no edema SKIN:  no rash, atraumatic   *Additional  and/or pertinent findings included in MDM below  Diagnostic and Interventional Summary    EKG Interpretation  Date/Time:  February 12, 2022 and 00:49:24 Ventricular Rate:   76 PR Interval:   159 QRS Duration:  164 QT Interval:  456  QTC Calculation:  513 R Axis:     Text Interpretation: Bundle branch block, sinus rhythm, no significant change from prior       Labs Reviewed  CBC - Abnormal; Notable for the following components:      Result Value   RBC 3.31 (*)    Hemoglobin 9.2 (*)    HCT 31.3 (*)    MCHC 29.4 (*)    RDW 15.9 (*)    All other components within normal limits  COMPREHENSIVE METABOLIC PANEL - Abnormal; Notable for the following components:   Albumin 3.3 (*)    All other components within normal limits  PROTIME-INR - Abnormal; Notable for the following components:   Prothrombin Time 23.9 (*)    INR 2.2 (*)    All other components within normal limits  I-STAT CHEM 8, ED - Abnormal; Notable for the following components:   Calcium, Ion 1.04 (*)    Hemoglobin 9.9 (*)    HCT 29.0 (*)    All other components within normal limits  TROPONIN I (HIGH SENSITIVITY)  TROPONIN I (HIGH SENSITIVITY)    CT HEAD WO CONTRAST (5MM)  Final Result    CT CERVICAL SPINE WO CONTRAST  Final Result    CT ABDOMEN PELVIS W CONTRAST  Final Result    CT Angio Chest Pulmonary Embolism (PE) W or WO Contrast  Final Result    CT L-SPINE NO CHARGE  Final Result    DG Chest Port 1 View  Final Result    CT HEAD WO CONTRAST (5MM)    (Results Pending)    Medications  iohexol (OMNIPAQUE) 350 MG/ML injection 100 mL (100 mLs Intravenous Contrast Given 02/12/22 0142)     Procedures  /  Critical Care .Critical Care  Performed by: Maudie Flakes, MD Authorized by: Maudie Flakes, MD   Critical care provider statement:    Critical care time (minutes):  80   Critical care was necessary to treat or prevent imminent or life-threatening deterioration of the following conditions:  Subarachnoid hemorrhage.   Critical care was time spent personally by me on the following activities:  Development of treatment plan with patient or surrogate, discussions with consultants, evaluation of patient's response to treatment, examination of patient, ordering and review of laboratory studies, ordering and review of radiographic studies, ordering and  performing treatments and interventions, pulse oximetry, re-evaluation of patient's condition and review of old charts   ED Course and Medical Decision Making  Initial Impression and Ddx Frequent syncope, patient recently admitted for syncopal episode followed by severely diminished GCS requiring intubation.  Doing quite well at this time on my assessment with normal vital signs, fully alert and oriented, having some increased pain to the buttocks, back, unsure if he hit his head.  Differential diagnosis includes intracranial bleeding, spinal fracture, worsening gluteal hematoma, also considering PE given the recurrent syncope and antiphospholipid syndrome, awaiting imaging, labs.  Past medical/surgical history that increases complexity of ED encounter: CAD, PAD, vertebrobasilar insufficiency  Interpretation of Diagnostics I personally reviewed the EKG and my interpretation is as follows: Bundle branch block, unchanged  Labs overall reassuring with no significant blood count or electrolyte disturbance, CT imaging revealing a likely traumatic subarachnoid hemorrhage, no other significant findings.  Patient Reassessment and Ultimate Disposition/Management Clinical Course as of 02/12/22 0656  Thu Feb 12, 2022  0304 Called by radiology for emergent findings on head CT, subarachnoid hemorrhage is noted.  Discussed case with Dr. Adelene Idler of neurosurgery, it is small and so plan is to hold off on reversal of Coumadin at this time, repeat CT head at 7 AM. [MB]  0321 Patient is still without real explanation for his syncopal events.  They occur without  prodrome or warning.  During last admission neurology was consulted and they do not feel that he has enough vertebrobasilar insufficiency to be causing the syncope.  Our workup for PE today is negative.  The admitting team during last hospitalization suggested close cardiology follow-up for possible implantation of loop recorder.  Patient tried but was unable to follow-up with cardiology due to retirement of his normal cardiologist and great difficulty getting an appointment at [facility] where he has been seen in the past.  Will consult cardiology for evaluation this morning. [MB]    Clinical Course User Index [MB] Sabas Sous, MD     Plan is for cardiology evaluation and follow-up of repeat CT head.  Signed out to oncoming provider.  Dr. Jacinto Halim of cardiology has been consulted and is aware of the case, he will come to see the patient and will consider loop recorder insertion either today or in the office tomorrow.  Patient management required discussion with the following services or consulting groups:  Cardiology and Neurosurgery  Complexity of Problems Addressed Acute illness or injury that poses threat of life of bodily function  Additional Data Reviewed and Analyzed Further history obtained from: Recent discharge summary and Prior labs/imaging results  Additional Factors Impacting ED Encounter Risk Consideration of hospitalization  Elmer Sow. Pilar Plate, MD [facility] [email or web address]  Final Clinical Impressions(s) / ED Diagnoses     ICD-10-CM   1. Syncope, unspecified syncope type  R55     2. Subarachnoid hemorrhage (HCC)  I60.9     3. Fall, initial encounter  [email or web address]       ED Discharge Orders     None        Discharge Instructions Discussed with and Provided to Patient:   Discharge Instructions   None      Sabas Sous, MD 02/12/22 4627    Sabas Sous, MD 02/12/22 [id number]

## 2022-02-13 ENCOUNTER — Encounter (HOSPITAL_COMMUNITY)
Admission: EM | Disposition: A | Discharge status: Home Health | Payer: Self-pay | Source: Home / Self Care | Attending: Internal Medicine

## 2022-02-13 ENCOUNTER — Observation Stay (HOSPITAL_COMMUNITY): Payer: Medicare Other

## 2022-02-13 DIAGNOSIS — I2510 Atherosclerotic heart disease of native coronary artery without angina pectoris: Secondary | ICD-10-CM | POA: Diagnosis not present

## 2022-02-13 DIAGNOSIS — G9341 Metabolic encephalopathy: Secondary | ICD-10-CM | POA: Diagnosis not present

## 2022-02-13 DIAGNOSIS — I350 Nonrheumatic aortic (valve) stenosis: Secondary | ICD-10-CM

## 2022-02-13 DIAGNOSIS — R29898 Other symptoms and signs involving the musculoskeletal system: Secondary | ICD-10-CM | POA: Diagnosis not present

## 2022-02-13 DIAGNOSIS — S066XAA Traumatic subarachnoid hemorrhage with loss of consciousness status unknown, initial encounter: Secondary | ICD-10-CM | POA: Diagnosis not present

## 2022-02-13 DIAGNOSIS — Z01818 Encounter for other preprocedural examination: Secondary | ICD-10-CM | POA: Diagnosis not present

## 2022-02-13 DIAGNOSIS — I700 Atherosclerosis of aorta: Secondary | ICD-10-CM | POA: Diagnosis not present

## 2022-02-13 DIAGNOSIS — R55 Syncope and collapse: Secondary | ICD-10-CM | POA: Diagnosis not present

## 2022-02-13 HISTORY — PX: RIGHT/LEFT HEART CATH AND CORONARY/GRAFT ANGIOGRAPHY: CATH118267

## 2022-02-13 LAB — POCT I-STAT EG7
Acid-Base Excess: 2 mmol/L (ref 0.0–2.0)
Acid-Base Excess: 2 mmol/L (ref 0.0–2.0)
Bicarbonate: 29.6 mmol/L — ABNORMAL HIGH (ref 20.0–28.0)
Bicarbonate: 30.1 mmol/L — ABNORMAL HIGH (ref 20.0–28.0)
Calcium, Ion: 1.23 mmol/L (ref 1.15–1.40)
Calcium, Ion: 1.24 mmol/L (ref 1.15–1.40)
HCT: 26 % — ABNORMAL LOW (ref 39.0–52.0)
HCT: 26 % — ABNORMAL LOW (ref 39.0–52.0)
Hemoglobin: 8.8 g/dL — ABNORMAL LOW (ref 13.0–17.0)
Hemoglobin: 8.8 g/dL — ABNORMAL LOW (ref 13.0–17.0)
O2 Saturation: 57 %
O2 Saturation: 60 %
Potassium: 4 mmol/L (ref 3.5–5.1)
Potassium: 4 mmol/L (ref 3.5–5.1)
Sodium: 142 mmol/L (ref 135–145)
Sodium: 142 mmol/L (ref 135–145)
TCO2: 32 mmol/L (ref 22–32)
TCO2: 32 mmol/L (ref 22–32)
pCO2, Ven: 65.1 mmHg — ABNORMAL HIGH (ref 44–60)
pCO2, Ven: 66.6 mmHg — ABNORMAL HIGH (ref 44–60)
pH, Ven: 7.263 (ref 7.25–7.43)
pH, Ven: 7.267 (ref 7.25–7.43)
pO2, Ven: 35 mmHg (ref 32–45)
pO2, Ven: 37 mmHg (ref 32–45)

## 2022-02-13 LAB — POCT I-STAT 7, (LYTES, BLD GAS, ICA,H+H)
Acid-Base Excess: 2 mmol/L (ref 0.0–2.0)
Bicarbonate: 29.6 mmol/L — ABNORMAL HIGH (ref 20.0–28.0)
Calcium, Ion: 1.24 mmol/L (ref 1.15–1.40)
HCT: 26 % — ABNORMAL LOW (ref 39.0–52.0)
Hemoglobin: 8.8 g/dL — ABNORMAL LOW (ref 13.0–17.0)
O2 Saturation: 98 %
Potassium: 4 mmol/L (ref 3.5–5.1)
Sodium: 142 mmol/L (ref 135–145)
TCO2: 32 mmol/L (ref 22–32)
pCO2 arterial: 65.3 mmHg (ref 32–48)
pH, Arterial: 7.264 — ABNORMAL LOW (ref 7.35–7.45)
pO2, Arterial: 120 mmHg — ABNORMAL HIGH (ref 83–108)

## 2022-02-13 LAB — CBC
HCT: 32.1 % — ABNORMAL LOW (ref 39.0–52.0)
Hemoglobin: 9.2 g/dL — ABNORMAL LOW (ref 13.0–17.0)
MCH: 27.6 pg (ref 26.0–34.0)
MCHC: 28.7 g/dL — ABNORMAL LOW (ref 30.0–36.0)
MCV: 96.4 fL (ref 80.0–100.0)
Platelets: 253 10*3/uL (ref 150–400)
RBC: 3.33 MIL/uL — ABNORMAL LOW (ref 4.22–5.81)
RDW: 16.3 % — ABNORMAL HIGH (ref 11.5–15.5)
WBC: 9.7 10*3/uL (ref 4.0–10.5)
nRBC: 0 % (ref 0.0–0.2)

## 2022-02-13 LAB — BASIC METABOLIC PANEL
Anion gap: 8 (ref 5–15)
BUN: 10 mg/dL (ref 8–23)
CO2: 27 mmol/L (ref 22–32)
Calcium: 8.6 mg/dL — ABNORMAL LOW (ref 8.9–10.3)
Chloride: 104 mmol/L (ref 98–111)
Creatinine, Ser: 0.92 mg/dL (ref 0.61–1.24)
GFR, Estimated: >60 mL/min (ref >60)
Glucose, Bld: 137 mg/dL — ABNORMAL HIGH (ref 70–99)
Potassium: 4.5 mmol/L (ref 3.5–5.1)
Sodium: 139 mmol/L (ref 135–145)

## 2022-02-13 LAB — PROTIME-INR
INR: 1.8 — ABNORMAL HIGH (ref 0.8–1.2)
Prothrombin Time: 20.4 seconds — ABNORMAL HIGH (ref 11.4–15.2)

## 2022-02-13 SURGERY — RIGHT/LEFT HEART CATH AND CORONARY/GRAFT ANGIOGRAPHY
Anesthesia: LOCAL

## 2022-02-13 MED ORDER — IOHEXOL 350 MG/ML SOLN
INTRAVENOUS | Status: DC | PRN
  Administered 2022-02-13: 70 mL via INTRA_ARTERIAL

## 2022-02-13 MED ORDER — SODIUM CHLORIDE 0.9 % IV SOLN: 250.0000 mL | INTRAVENOUS | Status: DC | PRN

## 2022-02-13 MED ORDER — HYDROCODONE-ACETAMINOPHEN 5-325 MG PO TABS
1.0000 | ORAL_TABLET | Freq: Four times a day (QID) | ORAL | Status: DC | PRN
  Administered 2022-02-13 – 2022-02-18 (×9): 1 via ORAL
  Filled 2022-02-13 (×9): qty 1

## 2022-02-13 MED ORDER — MIDAZOLAM HCL 2 MG/2ML IJ SOLN
INTRAMUSCULAR | Status: DC | PRN
  Administered 2022-02-13: 2 mg via INTRAVENOUS

## 2022-02-13 MED ORDER — VERAPAMIL HCL 2.5 MG/ML IV SOLN
INTRAVENOUS | Status: AC
  Filled 2022-02-13: qty 2

## 2022-02-13 MED ORDER — MIDAZOLAM HCL 2 MG/2ML IJ SOLN
INTRAMUSCULAR | Status: AC
  Filled 2022-02-13: qty 2

## 2022-02-13 MED ORDER — SODIUM CHLORIDE 0.9% FLUSH
3.0000 mL | Freq: Two times a day (BID) | INTRAVENOUS | Status: DC
  Administered 2022-02-14 – 2022-02-19 (×5): 3 mL via INTRAVENOUS

## 2022-02-13 MED ORDER — SODIUM CHLORIDE 0.9 % IV SOLN
INTRAVENOUS | Status: AC | PRN
  Administered 2022-02-13: 10 mL/h via INTRAVENOUS

## 2022-02-13 MED ORDER — LIDOCAINE HCL (PF) 1 % IJ SOLN
INTRAMUSCULAR | Status: DC | PRN
  Administered 2022-02-13 (×2): 2 mL

## 2022-02-13 MED ORDER — LIDOCAINE HCL (PF) 1 % IJ SOLN
INTRAMUSCULAR | Status: AC
  Filled 2022-02-13: qty 30

## 2022-02-13 MED ORDER — HEPARIN (PORCINE) IN NACL 1000-0.9 UT/500ML-% IV SOLN
INTRAVENOUS | Status: AC
  Filled 2022-02-13: qty 1000

## 2022-02-13 MED ORDER — FENTANYL CITRATE (PF) 100 MCG/2ML IJ SOLN
INTRAMUSCULAR | Status: DC | PRN
  Administered 2022-02-13: 25 ug via INTRAVENOUS

## 2022-02-13 MED ORDER — HEPARIN SODIUM (PORCINE) 1000 UNIT/ML IJ SOLN
INTRAMUSCULAR | Status: AC
  Filled 2022-02-13: qty 10

## 2022-02-13 MED ORDER — VERAPAMIL HCL 2.5 MG/ML IV SOLN
INTRAVENOUS | Status: DC | PRN
  Administered 2022-02-13: 10 mL via INTRA_ARTERIAL

## 2022-02-13 MED ORDER — NOREPINEPHRINE BITARTRATE 1 MG/ML IV SOLN
INTRAVENOUS | Status: DC | PRN
  Administered 2022-02-13: 5 ug/min via INTRAVENOUS

## 2022-02-13 MED ORDER — FENTANYL CITRATE (PF) 100 MCG/2ML IJ SOLN
INTRAMUSCULAR | Status: AC
  Filled 2022-02-13: qty 2

## 2022-02-13 MED ORDER — IOHEXOL 350 MG/ML SOLN
100.0000 mL | Freq: Once | INTRAVENOUS | Status: AC | PRN
  Administered 2022-02-13: 100 mL via INTRAVENOUS

## 2022-02-13 MED ORDER — SODIUM CHLORIDE 0.9% FLUSH: 3.0000 mL | INTRAVENOUS | Status: DC | PRN

## 2022-02-13 MED ORDER — HEPARIN SODIUM (PORCINE) 1000 UNIT/ML IJ SOLN
INTRAMUSCULAR | Status: DC | PRN
  Administered 2022-02-13: 5000 [IU] via INTRAVENOUS

## 2022-02-13 MED ORDER — SODIUM CHLORIDE 0.9 % WEIGHT BASED INFUSION
1.0000 mL/kg/h | INTRAVENOUS | Status: AC
  Administered 2022-02-13: 1 mL/kg/h via INTRAVENOUS

## 2022-02-13 MED ORDER — HEPARIN (PORCINE) IN NACL 1000-0.9 UT/500ML-% IV SOLN
INTRAVENOUS | Status: DC | PRN
  Administered 2022-02-13 (×2): 500 mL

## 2022-02-13 MED ORDER — NOREPINEPHRINE 4 MG/250ML-% IV SOLN
INTRAVENOUS | Status: AC
  Filled 2022-02-13: qty 250

## 2022-02-13 SURGICAL SUPPLY — 18 items
CATH INFINITI 5 FR AR1 MOD (CATHETERS) IMPLANT
CATH INFINITI 5 FR IM (CATHETERS) IMPLANT
CATH INFINITI 5FR AL1 (CATHETERS) IMPLANT
CATH INFINITI JR4 5F (CATHETERS) IMPLANT
CATH OPTITORQUE TIG 4.0 5F (CATHETERS) IMPLANT
CATH SWAN GANZ 7F STRAIGHT (CATHETERS) IMPLANT
DEVICE RAD COMP TR BAND LRG (VASCULAR PRODUCTS) IMPLANT
ELECT DEFIB PAD ADLT CADENCE (PAD) IMPLANT
GLIDESHEATH SLEND A-KIT 6F 22G (SHEATH) IMPLANT
GLIDESHEATH SLENDER 7FR .021G (SHEATH) IMPLANT
GUIDEWIRE INQWIRE 1.5J.035X260 (WIRE) IMPLANT
INQWIRE 1.5J .035X260CM (WIRE) ×1
KIT HEART LEFT (KITS) ×1 IMPLANT
PACK CARDIAC CATHETERIZATION (CUSTOM PROCEDURE TRAY) ×1 IMPLANT
SHEATH PROBE COVER 6X72 (BAG) IMPLANT
TRANSDUCER W/STOPCOCK (MISCELLANEOUS) ×1 IMPLANT
TUBING CIL FLEX 10 FLL-RA (TUBING) ×1 IMPLANT
WIRE HI TORQ VERSACORE-J 145CM (WIRE) IMPLANT

## 2022-02-13 NOTE — Plan of Care (Signed)
  Problem: Education: Goal: Understanding of CV disease, CV risk reduction, and recovery process will improve Outcome: Progressing   Problem: Cardiovascular: Goal: Ability to achieve and maintain adequate cardiovascular perfusion will improve Outcome: Progressing Goal: Vascular access site(s) Level 0-1 will be maintained Outcome: Progressing   

## 2022-02-13 NOTE — Progress Notes (Signed)
PROGRESS NOTE Jose [name]  [id number] DOB: 26-Nov-1955 DOA: 02/12/2022 PCP: Jose Paddy, MD   Brief Narrative/Hospital Course: 66 y.o. 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 -again admitted at [facility] 11/20-11/24  s/p fall/syncopal episode at home acutely altered requiring intubation and admitted into the ICU with concern for aspiration pneumonia.  Tracheal aspirate grew MRSA for which patient was transition to linezolid to treat for total 7 days patient had self extubated his self on 11/22.  After patient was noted to be stable he reports he felt like he was transferred to the floor and discharged home the next day despite neurology recommending him to be formally evaluated by cardiology at that time. He has been at home for 4 days and he states it took 2 days to obtain Linezolid meds.He reports having 2 more doses left to take to complete the 7-day course recommended. Again over the last 24 hours had 2 separate syncopal events: " during first 1 his wife noted that she was able to get him up from the floor.  However second 1 that occurred 11.29 he fell in between the countertop and the refrigerator leaving a dent there.  Patient  remembers walking to the fridge prior to coming to the hospital. Patient was bleeding from his nose and ultimately she called 911 the patient evaluated.   In ED: afebrile with pulse 74-107, respirations 13-25, blood pressure 102/45-147/71, and O2 saturations currently maintained on home 4 L of oxygen.  CT scan of the head, cervical spine, chest, abdomen, pelvis, and lumbar spine DONE: CT scan of the head noted small area of subarachnoid hemorrhage at the superior medial right convexity.  Labs were noted to be stable.  Neurosurgery have been consulted and recommended serial CT exams. The next CT scan of the brain noted slight interval increase in size of the small subarachnoid hemorrhage without mass effect or midline shift.  Cardiology was consulted for syncope. Patient admitted for further management  Subjective: Seen and examined this morning.  Resting comfortably on the bedside recliner no double vision speech difficulty numbness tingling or weakness.  No headache.   Assessment and Plan: Principal Problem:   Traumatic subarachnoid hemorrhage (Jose [name]) Active Problems:   Severe aortic stenosis   Recurrent syncope   COPD (chronic obstructive pulmonary disease) (HCC)   Chronic respiratory failure with hypoxia and hypercapnia (HCC)   Essential hypertension   Antiphospholipid syndrome (HCC)   Sleep apnea   History of pulmonary embolus (PE)-2011   Type 2 diabetes mellitus with hyperlipidemia (HCC)   Obesity (BMI 30-39.9)   CAD (coronary artery disease)   Small traumatic SAH while on anticoagulation with Coumadin: Neurosurgery input appreciated, appears neurologically intact.  Patient had a follow-up CT 11/30 in the ED slight interval increase in the  size of a small SAH.discussed with Jose [name] from neurosurgery>No need to repeat further CT, advised holding Coumadin for a week and follow-up with [facility] outpatient.  Monitor neurochecks.  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 For cardiac cath today by Dr. Vic [name] evaluation.  Underwent TAVR protocol CT heart plus CT angio chest plus CT angio abdomen pelvis this morning  COPD with chronic respiratory failure-on 4 to 6 L Rose and trilogy BiPAP at home.  Continue supplemental oxygen, does not want BiPAP or CPAP while here.  Continue inhalers/nebulizer and monitor respiratory status.  Ant 7th rib fracture on CT chest: Continue pain control  CAD with history of PCI stent, CABG x3:continue statin metoprolol.  Aspirin on hold resume once okay with neurosurgery Essential hypertension: BP is well controlled, continue metoprolol. HLD continue with Crestor 10 mg.  Recent aspiration pneumonia: Was recently hospitalized at [facility] completed Zyvox 12/1.   Normocytic anemia: Hemoglobin: 9 to 10 g.  Monitor Continue his Flomax/Cymbalta/Neurontin Topamax  History of PE in 2011 History of antiphospholipid antibody syndrome on chronic Coumadin: Goal as low as 2.0 due to recurrent falls- was seen by Dr Ignacia [name] on 01/23/22 at [facility]- there is a concern about his antiphospholipid Dx> repeat blood work was done and possible consideration to transition to DOAC due to low risk Dx>  plan was to follow up outpatient with him w/. APS labs- came back at Anticardiolipin IgG 26 ( normal 0-14), Anticardiolopin IgM normal, hexagonal phase phospholipid up 46 ( 0-11 normal), dRVVT Mix 62 ( nl 0-40.4s), dDRVT Confirm up 2.0 ( nl 0.8-1.2), beta2 glycoprotein igG,I gM normal, lupus anticoagulant consistent with lupus anticoagulant. CTA PE 11/30: neg for PE, ant 7th rib fracture.  T2DM: Poorly controlled blood sugar in 200, last A1c normal 5.6 in 02/02/2022.  Continue on diet control.  Holding OHA.Monitor Recent Labs  Lab 02/06/22 1144  02/06/22 1555  GLUCAP 217* 214*   BPH: Cont FLOMax  Class II Obesity:Patient's Body mass index is 36.95 kg/m. : Will benefit with PCP follow-up, weight loss  healthy lifestyle and outpatient sleep evaluation.  DVT prophylaxis: Place and maintain sequential compression device Start: 02/12/22 1734 SCDs Start: 02/12/22 1112 Code Status:   Code Status: Full Code Family Communication: plan of care discussed with patient at bedside. Patient status is: Admitted as observation, remains hospitalized forfurther cardiac work-up  Level of care: Progressive  Dispo: The patient is from: home            Anticipated disposition: Home Objective: Vitals last 24 hrs: Vitals:   02/12/22 2348 02/13/22 0518 02/13/22 0813 02/13/22 0909  BP: (!) 113/46 133/65  (!) 122/58  Pulse: 69 67  67  Resp: 17 18  18   Temp: 98 F (36.7 C) 97.8 F (36.6 C)  98 F (36.7 C)  TempSrc: Oral Oral  Oral  SpO2: 100% 100% 100% 100%  Weight:      Height:       Weight change:   Physical Examination: General exam: alert awake, older than stated age HEENT:Oral mucosa moist, Ear/Nose WNL grossly Respiratory system: bilaterally diminished BS, no use of accessory muscle Cardiovascular system: S1 & S2 +, No JVD.  Systolic murmur present Gastrointestinal system: Abdomen soft,NT,ND, BS+ Nervous System:Alert, awake, moving extremities. Extremities: LE edema neg,distal peripheral pulses palpable.  Skin: No rashes,no icterus. MSK: Normal  muscle bulk,tone, power  Medications reviewed:  Scheduled Meds:  [START ON 02/14/2022] DULoxetine  90 mg Oral Daily   fluticasone furoate-vilanterol  1 puff Inhalation Daily   And   umeclidinium bromide  1 puff Inhalation Daily   gabapentin  600 mg Oral BID   metoprolol tartrate  12.5 mg Oral BID   pantoprazole  40 mg Oral BID   rosuvastatin  10 mg Oral QPM   sodium chloride flush  3 mL Intravenous Q12H   sodium chloride flush  3 mL Intravenous Q12H   tamsulosin  0.4 mg Oral QHS    topiramate  75 mg Oral TID   torsemide  20 mg Oral Daily   Continuous Infusions:  sodium chloride     sodium chloride 1 mL/kg/hr (02/13/22 0545)    Diet Order             Diet NPO time specified Except for: Sips with Meds  Diet effective 0500 tomorrow                  Intake/Output Summary (Last 24 hours) at 02/13/2022 1131 Last data filed at 02/12/2022 2014 Gross per 24 hour  Intake --  Output 200 ml  Net -200 ml   Net IO Since Admission: -200 mL [02/13/22 1131]  Wt Readings from Last 3 Encounters:  02/12/22 107 kg  02/05/22 107.8 kg  01/22/22 111.7 kg     Unresulted Labs (From admission, onward)     Start     Ordered   02/13/22 [id number]  Basic metabolic panel  Daily at 5am,   R      02/12/22 1217   02/13/22 0500  CBC  Daily at 5am,   R      02/12/22 1217          Data Reviewed: I have personally reviewed following labs and imaging studies CBC: Recent Labs  Lab 02/12/22 0035 02/12/22 0051 02/12/22 2049 02/13/22 0148  WBC 9.3  --  8.9 9.7  HGB 9.2* 9.9* 9.2* 9.2*  HCT 31.3* 29.0* 31.1* 32.1*  MCV 94.6  --  93.4 96.4  PLT 308  --  297 [id number]   Basic Metabolic Panel: Recent Labs  Lab 02/12/22 0035 02/12/22 0051 02/13/22 0148  NA 140 139 139  K 4.1 4.1 4.5  CL 100 104 104  CO2 27  --  27  GLUCOSE 94 89 137*  BUN 14 15 10   CREATININE 0.83 0.90 0.92  CALCIUM 9.0  --  8.6*   GFR: Estimated Creatinine Clearance: 92.2 mL/min (by C-G formula based on SCr of 0.92 mg/dL). Liver Function Tests: Recent Labs  Lab 02/12/22 0035  AST 20  ALT 27  ALKPHOS 66  BILITOT 0.3  PROT 7.1  ALBUMIN 3.3*   No results for input(s): "LIPASE", "AMYLASE" in the last 168 hours. No results for input(s): "AMMONIA" in the last 168 hours. Coagulation Profile: Recent Labs  Lab 02/12/22 0035 02/12/22 2049 02/13/22 0710  INR 2.2* 2.2* 1.8*  No results found for this or any previous visit (from the past 240 hour(s)).  Antimicrobials: Anti-infectives (From admission,  onward)    Start     Dose/Rate Route Frequency Ordered Stop   02/12/22 2200  linezolid (ZYVOX) tablet 600 mg  Status:  Discontinued        600 mg Oral 2 times daily 02/12/22 1728 02/12/22 1739   02/12/22 2200  linezolid (ZYVOX) tablet 600 mg        600 mg Oral  2 times daily 02/12/22 1739 02/13/22 0912      Culture/Microbiology    Component Value Date/Time   SDES TRACHEAL ASPIRATE 02/03/2022 0727   SPECREQUEST NONE 02/03/2022 0727   CULT  02/03/2022 0727    ABUNDANT METHICILLIN RESISTANT STAPHYLOCOCCUS AUREUS   REPTSTATUS 02/05/2022 FINAL 02/03/2022 0727   Radiology Studies: CT CORONARY MORPH W/CTA COR W/SCORE W/CA W/CM &/OR WO/CM  Result Date: 02/13/2022 EXAM: OVER-READ INTERPRETATION  CT CHEST The following report is a limited chest CT over-read performed by radiologist Dr. Salvatore [name] of [facility], [place] on 02/13/2022. This over-read does not include interpretation of cardiac or coronary anatomy or pathology. The cardiac CTA interpretation by the cardiologist is attached. COMPARISON:  02/12/2022 chest CT angiogram. FINDINGS: Please see the separate concurrent chest CT angiogram report for details. IMPRESSION: Please see the separate concurrent chest CT angiogram report for details. Electronically Signed   By: Ilona Sorrel M.D.   On: 02/13/2022 10:46   CT HEAD WO CONTRAST (5MM)  Result Date: 02/12/2022 CLINICAL DATA:  Fall EXAM: CT HEAD WITHOUT CONTRAST TECHNIQUE: Contiguous axial images were obtained from the base of the skull through the vertex without intravenous contrast. RADIATION DOSE REDUCTION: This exam was performed according to the departmental dose-optimization program which includes automated exposure control, adjustment of the mA and/or kV according to patient size and/or use of iterative reconstruction technique. COMPARISON:  CT head obtained earlier the same day. FINDINGS: Brain: The right parafalcine extra-axial hematoma is slightly increased in size measuring up to 5  mm in thickness in the coronal plane, previously measured up to 4 mm. The hematoma now measures up to 1.5 cm in the craniocaudad dimension in the coronal plane, previously measured 0.9 cm. There is no mass effect on the underlying brain parenchyma. There is no other acute intracranial hemorrhage. There is no acute territorial infarct. The ventricles are stable in size. Gray-white differentiation is preserved There is no solid mass lesion.  There is no midline shift. Vascular: There is calcification of the bilateral carotid siphons and vertebral arteries. Skull: Normal. Negative for fracture or focal lesion. Sinuses/Orbits: The paranasal sinuses are clear. The globes and orbits are unremarkable. Other: None. IMPRESSION: Slight interval increase in size of the small right parafalcine extra-axial hematoma without mass effect or midline shift. Electronically Signed   By: Valetta Mole M.D.   On: 02/12/2022 08:17   CT ABDOMEN PELVIS W CONTRAST  Result Date: 02/12/2022 CLINICAL DATA:  Syncope. EXAM: CT ABDOMEN AND PELVIS WITH CONTRAST TECHNIQUE: Multidetector CT imaging of the abdomen and pelvis was performed using the standard protocol following bolus administration of intravenous contrast. RADIATION DOSE REDUCTION: This exam was performed according to the departmental dose-optimization program which includes automated exposure control, adjustment of the mA and/or kV according to patient size and/or use of iterative reconstruction technique. CONTRAST:  140mL OMNIPAQUE IOHEXOL 350 MG/ML SOLN COMPARISON:  February 02, 2022 FINDINGS: Lower chest: Multiple sternal wires are noted. Mild, posterior right basilar atelectasis and/or early infiltrate is seen. Hepatobiliary: No focal liver abnormality is seen. No gallstones, gallbladder wall thickening, or biliary dilatation. Pancreas: Unremarkable. No pancreatic ductal dilatation or surrounding inflammatory changes. Spleen: Normal in size without focal abnormality.  Adrenals/Urinary Tract: Adrenal glands are unremarkable. Kidneys are normal in size, without obstructing renal calculi, focal lesion, or hydronephrosis. A 12 mm nonobstructing renal calculus is seen within the mid right kidney. A 2 mm nonobstructing renal calculus is also seen within the lower pole of the left kidney. The urinary bladder  is markedly distended and is unremarkable. Stomach/Bowel: Stomach is within normal limits. The appendix is surgically absent. No evidence of bowel wall thickening, distention, or inflammatory changes. Noninflamed diverticula are seen throughout the large bowel. Vascular/Lymphatic: Aortic atherosclerosis. No enlarged abdominal or pelvic lymph nodes. Reproductive: The prostate gland is not clearly identified. Other: No abdominal wall hernia or abnormality. No abdominopelvic ascites. Musculoskeletal: Acute, nondisplaced fracture is seen within the anterior seventh left rib. Multilevel degenerative changes seen throughout the lumbar spine. IMPRESSION: 1. Mild, posterior right basilar atelectasis and/or early infiltrate. 2. Anterior seventh left rib fracture. 3. Bilateral nonobstructing renal calculi. 4. Colonic diverticulosis. 5. Aortic atherosclerosis. Aortic Atherosclerosis (ICD10-I70.0). Electronically Signed   By: Virgina Norfolk M.D.   On: 02/12/2022 02:08   CT HEAD WO CONTRAST (5MM)  Result Date: 02/12/2022 CLINICAL DATA:  Fall while on anti coagulation EXAM: CT HEAD WITHOUT CONTRAST CT CERVICAL SPINE WITHOUT CONTRAST TECHNIQUE: Multidetector CT imaging of the head and cervical spine was performed following the standard protocol without intravenous contrast. Multiplanar CT image reconstructions of the cervical spine were also generated. RADIATION DOSE REDUCTION: This exam was performed according to the departmental dose-optimization program which includes automated exposure control, adjustment of the mA and/or kV according to patient size and/or use of iterative  reconstruction technique. COMPARISON:  None Available. FINDINGS: CT HEAD FINDINGS Brain: Small area of subarachnoid hemorrhage at the superior medial right convexity. The size and configuration of the ventricles and extra-axial CSF spaces are normal. The brain parenchyma is normal, without evidence of acute or chronic infarction. Vascular: No abnormal hyperdensity of the major intracranial arteries or dural venous sinuses. No intracranial atherosclerosis. Skull: The visualized skull base, calvarium and extracranial soft tissues are normal. Sinuses/Orbits: No fluid levels or advanced mucosal thickening of the visualized paranasal sinuses. No mastoid or middle ear effusion. The orbits are normal. CT CERVICAL SPINE FINDINGS Alignment: No static subluxation. Facets are aligned. Occipital condyles are normally positioned. Skull base and vertebrae: No acute fracture. Soft tissues and spinal canal: No prevertebral fluid or swelling. No visible canal hematoma. Disc levels: No advanced spinal canal or neural foraminal stenosis. Upper chest: No pneumothorax, pulmonary nodule or pleural effusion. Other: Normal visualized paraspinal cervical soft tissues. IMPRESSION: 1. Small area of subarachnoid hemorrhage at the superior medial right convexity. 2. No acute fracture or static subluxation of the cervical spine. Critical Value/emergent results were called by telephone at the time of interpretation on 02/12/2022 at 2:07 am to provider [facility] , who verbally acknowledged these results. Electronically Signed   By: Ulyses Jarred M.D.   On: 02/12/2022 02:07   CT CERVICAL SPINE WO CONTRAST  Result Date: 02/12/2022 CLINICAL DATA:  Fall while on anti coagulation EXAM: CT HEAD WITHOUT CONTRAST CT CERVICAL SPINE WITHOUT CONTRAST TECHNIQUE: Multidetector CT imaging of the head and cervical spine was performed following the standard protocol without intravenous contrast. Multiplanar CT image reconstructions of the cervical spine  were also generated. RADIATION DOSE REDUCTION: This exam was performed according to the departmental dose-optimization program which includes automated exposure control, adjustment of the mA and/or kV according to patient size and/or use of iterative reconstruction technique. COMPARISON:  None Available. FINDINGS: CT HEAD FINDINGS Brain: Small area of subarachnoid hemorrhage at the superior medial right convexity. The size and configuration of the ventricles and extra-axial CSF spaces are normal. The brain parenchyma is normal, without evidence of acute or chronic infarction. Vascular: No abnormal hyperdensity of the major intracranial arteries or dural venous sinuses. No intracranial atherosclerosis.  Skull: The visualized skull base, calvarium and extracranial soft tissues are normal. Sinuses/Orbits: No fluid levels or advanced mucosal thickening of the visualized paranasal sinuses. No mastoid or middle ear effusion. The orbits are normal. CT CERVICAL SPINE FINDINGS Alignment: No static subluxation. Facets are aligned. Occipital condyles are normally positioned. Skull base and vertebrae: No acute fracture. Soft tissues and spinal canal: No prevertebral fluid or swelling. No visible canal hematoma. Disc levels: No advanced spinal canal or neural foraminal stenosis. Upper chest: No pneumothorax, pulmonary nodule or pleural effusion. Other: Normal visualized paraspinal cervical soft tissues. IMPRESSION: 1. Small area of subarachnoid hemorrhage at the superior medial right convexity. 2. No acute fracture or static subluxation of the cervical spine. Critical Value/emergent results were called by telephone at the time of interpretation on 02/12/2022 at 2:07 am to provider [facility] , who verbally acknowledged these results. Electronically Signed   By: Ulyses Jarred M.D.   On: 02/12/2022 02:07   CT L-SPINE NO CHARGE  Result Date: 02/12/2022 CLINICAL DATA:  Fall EXAM: CT LUMBAR SPINE WITHOUT CONTRAST TECHNIQUE:  Multidetector CT imaging of the lumbar spine was performed without intravenous contrast administration. Multiplanar CT image reconstructions were also generated. RADIATION DOSE REDUCTION: This exam was performed according to the departmental dose-optimization program which includes automated exposure control, adjustment of the mA and/or kV according to patient size and/or use of iterative reconstruction technique. COMPARISON:  None Available. FINDINGS: Segmentation: 5 lumbar type vertebrae. Alignment: Normal. Vertebrae: Wedge compression deformity of L1 is age indeterminate but unchanged since 01/22/2022. Disc levels: There is mild spinal canal stenosis at L2-3 and L3-4. Multilevel facet arthrosis and foraminal stenosis, worst at L2-3. IMPRESSION: 1. Wedge compression deformity of L1 is age indeterminate but unchanged since 01/22/2022. 2. Multilevel facet arthrosis and foraminal stenosis, worst at L2-3. Electronically Signed   By: Ulyses Jarred M.D.   On: 02/12/2022 02:04   CT Angio Chest Pulmonary Embolism (PE) W or WO Contrast  Result Date: 02/12/2022 CLINICAL DATA:  Syncope and subsequent fall. EXAM: CT ANGIOGRAPHY CHEST WITH CONTRAST TECHNIQUE: Multidetector CT imaging of the chest was performed using the standard protocol during bolus administration of intravenous contrast. Multiplanar CT image reconstructions and MIPs were obtained to evaluate the vascular anatomy. RADIATION DOSE REDUCTION: This exam was performed according to the departmental dose-optimization program which includes automated exposure control, adjustment of the mA and/or kV according to patient size and/or use of iterative reconstruction technique. CONTRAST:  162mL OMNIPAQUE IOHEXOL 350 MG/ML SOLN COMPARISON:  February 02, 2022 FINDINGS: Cardiovascular: There is mild calcification of the aortic arch, without evidence of aortic aneurysm. Satisfactory opacification of the pulmonary arteries to the segmental level. No evidence of pulmonary  embolism. There is mild to moderate severity cardiomegaly with marked severity coronary artery calcification and coronary artery vascular clips. No pericardial effusion. Mediastinum/Nodes: Multiple sternal wires are noted. No enlarged mediastinal, hilar, or axillary lymph nodes. A stable subcentimeter thyroid nodule is seen within the posterior aspect of the left lobe of the thyroid gland. The trachea and esophagus demonstrate no significant findings. Lungs/Pleura: Mild atelectasis and/or early infiltrate is seen within the posterior aspect of the right lung base. There is no evidence of a pleural effusion or pneumothorax. Upper Abdomen: No acute abnormality. Musculoskeletal: Acute anterior seventh left rib fracture is seen. A chronic posterolateral second left rib fracture is also noted. Multilevel degenerative changes seen throughout the thoracic spine. Review of the MIP images confirms the above findings. IMPRESSION: 1. No evidence of pulmonary embolism. 2.  Mild right basilar atelectasis and/or early infiltrate. 3. Evidence of prior median sternotomy/CABG. 4. Anterior seventh left rib fracture. 5. Aortic atherosclerosis. Aortic Atherosclerosis (ICD10-I70.0). Electronically Signed   By: Virgina Norfolk M.D.   On: 02/12/2022 02:02   DG Chest Port 1 View  Result Date: 02/12/2022 CLINICAL DATA:  Recent fall EXAM: PORTABLE CHEST 1 VIEW COMPARISON:  01/21/2022 FINDINGS: Cardiac shadow is enlarged. Postsurgical changes are noted. Lungs are clear bilaterally. No bony abnormality is noted. IMPRESSION: No active disease. Electronically Signed   By: Inez Catalina M.D.   On: 02/12/2022 00:49     LOS: 0 days   Antonieta Pert, MD [facility]  02/13/2022, 11:31 AM

## 2022-02-13 NOTE — Hospital Course (Addendum)
66 y.o. 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   [facility] 11/20-11/24  s/p fall/syncopal episode at home acutely altered requiring intubation and admitted into the ICU, also treated for MRSA aspiration pneumonia.  Possible seizure though not captured on EEG.Dr. Amada Jupiter note 11/24 very thoughtfully explains patient's prodrome before falling which is less consistent with seizure more likely syncope of other etiology, his posterior circulation stenosis not felt likely to cause to vertebral artery insufficiency. >Presented to [facility] ED 11/30 secondary to fall at home leaving a dent on refrigerator.   In ED: afebrile with pulse 74-107, respirations 13-25, blood pressure 102/45-147/71, and O2 saturating well on home oxygen 4 L  CT scan of the head, cervical spine, chest, abdomen, pelvis, and lumbar spine DONE: CT scan of the head noted small area of subarachnoid hemorrhage at the superior medial right convexity.  Labs were noted to be stable.  Neurosurgery have been consulted and recommended serial CT exams. The next CT scan of the brain noted slight interval increase in size of the small subarachnoid hemorrhage without mass effect or midline shift.  Cardiology was consulted for syncope. Patient admitted for further management. Initially felt AS as the etiology for syncope so had cardiac cath 12/1:Widely patent native  vessels on the left system, no significant stenosis was identified however has been grafted. Only lesion that was felt to be significant was distal circumflex which is occluded. Right coronary artery has a prior stent that is occluded, very large RCA which is now bypassed and graft retrogradely fills the native RCA all the way back to the stent at the ostium.

## 2022-02-13 NOTE — Progress Notes (Signed)
OT Cancellation Note  Patient Details Name: Jose [name] MRN: [id number] DOB: 1955-08-26   Cancelled Treatment:    Reason Eval/Treat Not Completed: Patient declined, no reason specified. Pt reporting needing to rest at this time after CT before procedure this afternoon.  Will follow acutely and see as able.   Barry Brunner, OT Acute Rehabilitation Services Office [phone]   Jose [name] 02/13/2022, 10:10 AM

## 2022-02-13 NOTE — Progress Notes (Signed)
Patient ID: Jose [name], male   DOB: August 27, 1955, 66 y.o.   MRN: [id number] I would repeat his head CT tomorrow and if stable would resume his Coumadin since he has antiphospholipid antibody syndrome.  He has a history of previous PE.  Okay to start low-dose Lovenox to bridge him until INR is therapeutic.

## 2022-02-13 NOTE — Progress Notes (Signed)
PT Cancellation Note  Patient Details Name: Jose [name] MRN: [id number] DOB: January 24, 1956   Cancelled Treatment:    Reason Eval/Treat Not Completed: Patient declined, no reason specified. Pt polite, but declining to mobilize at this time, saying he needs to rest and is sore from getting imaging completed this morning. Pt agreeable to PT checking back later if time permits.   Raymond Gurney, PT, DPT Acute Rehabilitation Services  Office: [phone]    Jewel Baize 02/13/2022, 9:54 AM

## 2022-02-13 NOTE — Evaluation (Signed)
Physical Therapy Evaluation Patient Details Name: Jose [name] MRN: [id number] DOB: Nov 20, 1955 Today's Date: 02/13/2022  History of Present Illness  Pt is a 66 y/o male presenting on 11/30 after fall. CT head with small subarachnoid hemorrhage at superior medial right convexity. Noted multiple hospitalizations for falls/syncope in 01/2022. Plan for diagnostic right and L heart cath, TAVR evaluation 12/1. PMH includes: CAD s/p CABG, aortic valve stenosis, PVD, COPD, chronic respiratory failure on 4-6L O2, PE, T2DM, syncope.   Clinical Impression  Upon arrival, transport present attempting to assist pt back to bed from chair to take him for his procedure, therefore eval limited by time constraint. Pt presents with condition above and deficits mentioned below, see PT Problem List. Home set-up and PLOF info carried over from recent entry 02/05/22 as time was limited. Per that entry, pt was mod I without an AD for mobility, living with his wife in a 1-level house with 3-4 STE. Currently, pt demonstrates deficits in bil lower extremity strength, balance, activity tolerance, and possibly cognition. It was difficult to identify whether pt was joking or serious when asking "what chair?" when cued to push up from chair he was currently sitting in and when pt asked "how do spell "now"?". Pt also with tangential speech and questionably inappropriate topics. He was able to transfer to stand and ambulate ~8 ft from chair to the bed with a RW and min guard assist only for safety with no overt LOB. Further mobility was limited by time. Will continue to follow acutely and recommend follow-up with HHPT at d/c as pt appears to have the level of support he needs at home.      Recommendations for follow up therapy are one component of a multi-disciplinary discharge planning process, led by the attending physician.  Recommendations may be updated based on patient status, additional functional criteria and insurance  authorization.  Follow Up Recommendations Home health PT      Assistance Recommended at Discharge Frequent or constant Supervision/Assistance  Patient can return home with the following  Help with stairs or ramp for entrance;A little help with bathing/dressing/bathroom;A little help with walking and/or transfers;Assist for transportation;Direct supervision/assist for medications management;Direct supervision/assist for financial management;Assistance with cooking/housework    Equipment Recommendations Other (comment) (shower stool)  Recommendations for Other Services       Functional Status Assessment Patient has had a recent decline in their functional status and demonstrates the ability to make significant improvements in function in a reasonable and predictable amount of time.     Precautions / Restrictions Precautions Precautions: Fall;Other (comment) Precaution Comments: Recurrent syncope & falls, orthostatic Restrictions Weight Bearing Restrictions: No      Mobility  Bed Mobility Overal bed mobility: Needs Assistance Bed Mobility: Sit to Supine       Sit to supine: Mod assist, HOB elevated   General bed mobility comments: HOB elevated to pt per his request and pt placing hand on rail for support with sit > supine transition, needing modA to lift legs onto bed.    Transfers Overall transfer level: Needs assistance Equipment used: Rolling walker (2 wheels) Transfers: Sit to/from Stand Sit to Stand: Min guard           General transfer comment: Pt needing extra time and cues to understand to push up on chair's arm rest to stand (saying "what chair?") vs pulling on RW, min guard assist for safety.    Ambulation/Gait Ambulation/Gait assistance: Min guard Gait Distance (Feet): 8 Feet Assistive device: Rolling  walker (2 wheels) Gait Pattern/deviations: Step-through pattern, Decreased stride length, Antalgic Gait velocity: decr Gait velocity interpretation:  <1.31 ft/sec, indicative of household ambulator   General Gait Details: Pt with slow, but fairly steady gait, groaning about pain in his leg and buttocks therefore demonstrating a stiff, guarded antalgic gait pattern. No LOB, min guard assist for safety.  Stairs            Wheelchair Mobility    Modified Rankin (Stroke Patients Only)       Balance Overall balance assessment: Needs assistance Sitting-balance support: Feet supported Sitting balance-[name] Scale: Good     Standing balance support: Bilateral upper extremity supported, During functional activity, Reliant on assistive device for balance Standing balance-[name] Scale: Poor Standing balance comment: Reliant on RW                             Pertinent Vitals/Pain Pain Assessment Pain Assessment: Faces Faces Pain Scale: Hurts little more Pain Location: L leg, R buttocks Pain Descriptors / Indicators: Discomfort, Grimacing, Moaning, Guarding Pain Intervention(s): Limited activity within patient's tolerance, Monitored during session, Repositioned    Home Living Family/patient expects to be discharged to:: Private residence Living Arrangements: Spouse/significant other Available Help at Discharge: Family;Available PRN/intermittently Type of Home: House Home Access: Stairs to enter Entrance Stairs-Rails: Doctor, general practice of Steps: 3-4   Home Layout: One level Home Equipment: Rollator (4 wheels) Additional Comments: Sleeps in recliner. (all info for home set-up carried over from 02/05/22 entry as did not have time to ask him questions with him transporting to cath lab)    Prior Function Prior Level of Function : Patient poor historian/Family not available (all info for PLOF carried over from 02/05/22 entry as did not have time to ask him questions with him transporting to cath lab)             Mobility Comments: Per pt he is modified independent with household ambulation without  assistive device. Frequent syncope. Sleeps in recliner ADLs Comments: Assist as needed for lower body ADL, spouse assists with iADL and community mobility.     Hand Dominance   Dominant Hand: Right (per 02/06/22 entry, pt stated "no" to being R-handed but then when asked if he was L-handed he said "kind of")    Extremity/Trunk Assessment   Upper Extremity Assessment Upper Extremity Assessment: Defer to OT evaluation (seemed fairly symmetrical in strength with MMT of shoulder flexion (4+ bil))    Lower Extremity Assessment Lower Extremity Assessment: RLE deficits/detail;LLE deficits/detail RLE Deficits / Details: MMT scores of 3+ knee extension, 4+ ankle dorsiflexion; unsure if knee extension MMT limited by buttocks pain or poor comprehension of what was requested of him LLE Deficits / Details: MMT scores of 4 knee extension, 4+ ankle dorsiflexion    Cervical / Trunk Assessment Cervical / Trunk Assessment: Normal  Communication   Communication: No difficulties  Cognition Arousal/Alertness: Awake/alert Behavior During Therapy: WFL for tasks assessed/performed Overall Cognitive Status: No family/caregiver present to determine baseline cognitive functioning Area of Impairment: Awareness, Problem solving, Attention                   Current Attention Level: Selective       Awareness: Emergent Problem Solving: Difficulty sequencing, Requires verbal cues, Slow processing General Comments: Pt with tangential speech at times, and with some questionably inappropriate topics. Pt also with some sarcastic humor, unsure if trying to hide a deficit or this is  just his normal. Needs slightly increased time to follow cues and to problem-solve sequencing of mobility. Pt asking how to spell "now" and then said "what chair?" when asked to push up from chair to stand, unsure if pt was serious or joking. No family present to confirm baseline.        General Comments General comments (skin  integrity, edema, etc.): Pt on 4L O2 via [place], VSS but did not have time to take orthostatics due to pt needing to be transferred chair to bed to be taken to cath lab for procedure    Exercises     Assessment/Plan    PT Assessment Patient needs continued PT services  PT Problem List Decreased strength;Decreased balance;Decreased mobility;Cardiopulmonary status limiting activity;Decreased cognition;Decreased activity tolerance       PT Treatment Interventions DME instruction;Gait training;Stair training;Functional mobility training;Therapeutic activities;Therapeutic exercise;Balance training;Cognitive remediation;Patient/family education;Neuromuscular re-education    PT Goals (Current goals can be found in the Care Plan section)  Acute Rehab PT Goals Patient Stated Goal: did not state PT Goal Formulation: With patient Time For Goal Achievement: 02/27/22 Potential to Achieve Goals: Good    Frequency Min 3X/week     Co-evaluation               AM-PAC PT "6 Clicks" Mobility  Outcome Measure Help needed turning from your back to your side while in a flat bed without using bedrails?: A Little Help needed moving from lying on your back to sitting on the side of a flat bed without using bedrails?: A Little Help needed moving to and from a bed to a chair (including a wheelchair)?: A Little Help needed standing up from a chair using your arms (e.g., wheelchair or bedside chair)?: A Little Help needed to walk in hospital room?: A Little (likely could do 20 ft, but limited in time) Help needed climbing 3-5 steps with a railing? : Total 6 Click Score: 16    End of Session Equipment Utilized During Treatment: Oxygen Activity Tolerance: Patient tolerated treatment well;Other (comment) (limited by pt needing to be transported for procedure) Patient left: in bed;Other (comment) (with transport to go to cath lab)   PT Visit Diagnosis: Unsteadiness on feet (R26.81);Other abnormalities of  gait and mobility (R26.89);Difficulty in walking, not elsewhere classified (R26.2);Repeated falls (R29.6);Muscle weakness (generalized) (M62.81)    Time: 1341-1400 PT Time Calculation (min) (ACUTE ONLY): 19 min   Charges:   PT Evaluation $PT Eval Moderate Complexity: 1 Mod          Moishe Spice, PT, DPT Acute Rehabilitation Services  Office: [phone]   Orvan Falconer 02/13/2022, 2:22 PM

## 2022-02-13 NOTE — Interval H&P Note (Signed)
History and Physical Interval Note:  02/13/2022 2:12 PM  Jose [name]  has presented today for surgery, with the diagnosis of aortic stenosis.  The various methods of treatment have been discussed with the patient and family. After consideration of risks, benefits and other options for treatment, the patient has consented to  Procedure(s): RIGHT/LEFT HEART CATH AND CORONARY/GRAFT ANGIOGRAPHY (N/A) as a surgical intervention.  The patient's history has been reviewed, patient examined, no change in status, stable for surgery.  I have reviewed the patient's chart and labs.  Questions were answered to the patient's satisfaction.     Yates Decamp

## 2022-02-14 ENCOUNTER — Observation Stay (HOSPITAL_COMMUNITY): Payer: Medicare Other

## 2022-02-14 DIAGNOSIS — R29898 Other symptoms and signs involving the musculoskeletal system: Secondary | ICD-10-CM | POA: Diagnosis not present

## 2022-02-14 DIAGNOSIS — I2510 Atherosclerotic heart disease of native coronary artery without angina pectoris: Secondary | ICD-10-CM | POA: Diagnosis not present

## 2022-02-14 DIAGNOSIS — I350 Nonrheumatic aortic (valve) stenosis: Secondary | ICD-10-CM | POA: Diagnosis not present

## 2022-02-14 DIAGNOSIS — I6200 Nontraumatic subdural hemorrhage, unspecified: Secondary | ICD-10-CM | POA: Diagnosis not present

## 2022-02-14 DIAGNOSIS — R55 Syncope and collapse: Secondary | ICD-10-CM | POA: Diagnosis not present

## 2022-02-14 DIAGNOSIS — I619 Nontraumatic intracerebral hemorrhage, unspecified: Secondary | ICD-10-CM | POA: Diagnosis not present

## 2022-02-14 DIAGNOSIS — S066X0A Traumatic subarachnoid hemorrhage without loss of consciousness, initial encounter: Secondary | ICD-10-CM | POA: Diagnosis not present

## 2022-02-14 DIAGNOSIS — G9341 Metabolic encephalopathy: Secondary | ICD-10-CM | POA: Diagnosis not present

## 2022-02-14 DIAGNOSIS — G936 Cerebral edema: Secondary | ICD-10-CM | POA: Diagnosis not present

## 2022-02-14 DIAGNOSIS — S066XAA Traumatic subarachnoid hemorrhage with loss of consciousness status unknown, initial encounter: Secondary | ICD-10-CM | POA: Diagnosis not present

## 2022-02-14 DIAGNOSIS — I6523 Occlusion and stenosis of bilateral carotid arteries: Secondary | ICD-10-CM

## 2022-02-14 LAB — CBC
HCT: 26.8 % — ABNORMAL LOW (ref 39.0–52.0)
Hemoglobin: 7.8 g/dL — ABNORMAL LOW (ref 13.0–17.0)
MCH: 28.1 pg (ref 26.0–34.0)
MCHC: 29.1 g/dL — ABNORMAL LOW (ref 30.0–36.0)
MCV: 96.4 fL (ref 80.0–100.0)
Platelets: 234 10*3/uL (ref 150–400)
RBC: 2.78 MIL/uL — ABNORMAL LOW (ref 4.22–5.81)
RDW: 16.4 % — ABNORMAL HIGH (ref 11.5–15.5)
WBC: 9 10*3/uL (ref 4.0–10.5)
nRBC: 0 % (ref 0.0–0.2)

## 2022-02-14 LAB — PROTIME-INR
INR: 1.3 — ABNORMAL HIGH (ref 0.8–1.2)
Prothrombin Time: 16.2 seconds — ABNORMAL HIGH (ref 11.4–15.2)

## 2022-02-14 LAB — BASIC METABOLIC PANEL
Anion gap: 7 (ref 5–15)
BUN: 11 mg/dL (ref 8–23)
CO2: 26 mmol/L (ref 22–32)
Calcium: 8.2 mg/dL — ABNORMAL LOW (ref 8.9–10.3)
Chloride: 107 mmol/L (ref 98–111)
Creatinine, Ser: 0.95 mg/dL (ref 0.61–1.24)
GFR, Estimated: >60 mL/min (ref >60)
Glucose, Bld: 130 mg/dL — ABNORMAL HIGH (ref 70–99)
Potassium: 3.9 mmol/L (ref 3.5–5.1)
Sodium: 140 mmol/L (ref 135–145)

## 2022-02-14 MED ORDER — SALINE SPRAY 0.65 % NA SOLN
1.0000 | NASAL | Status: DC | PRN
  Filled 2022-02-14: qty 44

## 2022-02-14 NOTE — Progress Notes (Signed)
Subjective:  Patient has no specific complaints today.  He has not had any complications from cardiac catheterization yesterday.  Brief History: Jose [name] is a 66 y.o.  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   Intake/Output from previous day:  I/O last 3 completed shifts: In: 1724.7 [P.O.:340; I.V.:1384.7] Out: 1900 [Urine:1900] Total I/O In: -  Out: 1425 [[id number]:[id number]] Net IO Since Admission: -1,600.33 mL [02/14/22 1750]  Blood pressure 135/60, pulse 73, temperature 97.8 F (36.6 C), resp. rate 20, height 5' 7" (1.702 m), weight 107 kg, SpO2 92 %.   Orthostatic VS for the past 24 hrs:  BP- Lying Pulse- Lying BP- Sitting Pulse- Sitting BP- Standing at 0 minutes Pulse- Standing at 0 minutes  02/14/22 1215 102/67 72 -- -- -- --  02/14/22 1208 -- -- -- -- 109/69 72  02/14/22 1207 -- -- 95/65 68 -- --  02/14/22 1120 102/67 72 95/65 68 109/69 72     Physical Exam Neck:     Vascular: Carotid bruit (bilateral) present. No JVD.  Cardiovascular:     Rate and Rhythm: Normal rate and regular rhythm.     Pulses:          Dorsalis pedis pulses are 0 on the right side and 0 on the left side.       Posterior tibial pulses are 0 on the right side and 0 on the left side.     Heart sounds: Normal heart sounds. No murmur heard.    No gallop.  Pulmonary:     Effort: Pulmonary effort is normal.     Breath sounds: Normal breath  sounds.  Abdominal:     General: Bowel sounds are normal.     Palpations: Abdomen is soft.  Musculoskeletal:     Right lower leg: No edema.     Left lower leg: No edema.  Skin:    Comments: Bilateral lower extremity chronic venostasis pigmentation and skin thickening noted without edema     Lab Results: Lab Results  Component Value Date   NA 140 02/14/2022   K 3.9 02/14/2022   CO2 26 02/14/2022   GLUCOSE 130 (H) 02/14/2022   BUN 11 02/14/2022   CREATININE 0.95 02/14/2022   CALCIUM 8.2 (L) 02/14/2022   GFRNONAA >60 02/14/2022    BNP (last 3 results) Recent Labs    11/05/21 2043 01/21/22 2005 02/02/22 0410  BNP 292.6* 87.4 302.8*       Latest Ref Rng & Units 02/14/2022    1:10 AM 02/13/2022    2:55 PM 02/13/2022    2:47 PM  BMP  Glucose 70 - 99 mg/dL 130     BUN 8 - 23 mg/dL 11     Creatinine 0.61 - 1.24 mg/dL 0.95     Sodium 135 - 145 mmol/L 140  142  142   Potassium 3.5 - 5.1 mmol/L 3.9  4.0  4.0   Chloride 98 - 111 mmol/L 107     CO2 22 - 32 mmol/L 26     Calcium  8.9 - 10.3 mg/dL 8.2         Latest Ref Rng & Units 02/12/2022   12:35 AM 02/03/2022    6:17 AM 02/02/2022    4:10 AM  Hepatic Function  Total Protein 6.5 - 8.1 g/dL 7.1  6.4  7.3   Albumin 3.5 - 5.0 g/dL 3.3  2.9  3.4   AST 15 - 41 U/L 20  19  36   ALT 0 - 44 U/L 27  24  37   Alk Phosphatase 38 - 126 U/L 66  60  69   Total Bilirubin 0.3 - 1.2 mg/dL 0.3  0.4  0.3       Latest Ref Rng & Units 02/14/2022    1:10 AM 02/13/2022    2:55 PM 02/13/2022    2:47 PM  CBC  WBC 4.0 - 10.5 K/uL 9.0     Hemoglobin 13.0 - 17.0 g/dL 7.8  8.8  8.8   Hematocrit 39.0 - 52.0 % 26.8  26.0  26.0   Platelets 150 - 400 K/uL 234      Lipid Panel     Component Value Date/Time   CHOL 156 08/16/2016 0922   TRIG 133 02/04/2022 0030   HDL 28 (L) 08/16/2016 0922   CHOLHDL 5.6 08/16/2016 0922   VLDL 64 (H) 08/16/2016 0922   LDLCALC 64 08/16/2016 0922   HEMOGLOBIN A1C Lab Results  Component Value Date   HGBA1C  5.6 02/02/2022   MPG 114.02 02/02/2022   TSH No results for input(s): "TSH" in the last 8760 hours. Imaging: Imaging results have been reviewed  Cardiac Studies:  EKG:  EKG 02/12/2022: Normal sinus rhythm at rate of 76 bpm, normal axis, right bundle branch block.  No evidence of ischemia.  Carotid artery duplex 12/06/2020: 1.  Plaque involving distal right common carotid artery extending into  proximal right cervical internal carotid artery.  Velocity measurements suggest 50-69% degree stenosis in the right cervical internal carotid artery.  2.  Plaque involving distal left common carotid artery extending into the proximal left internal carotid artery.  Velocity measurements suggest less than 50% degree stenosis in left cervical internal carotid artery.  3.  Additional prominent plaque in the proximal left external carotid artery with markedly elevated velocities in left external carotid artery.  4.  Antegrade flow in both vertebral arteries.   Lower extremity arterial duplex 09/02/2021: Current ABI: Rt .87,Lt 1.01  Mild diffuse disease throughout the lower extremity with mildly abnormal biphasic waveforms.    Tilt Table Test 01/19/2022: Passive and provocative tilt table test (isuprel) with stable BP and HR, no induced vasovagal syncope. Negative carotid massage. Stable BP/HR with valsalva maneuver, deep breathing, and cold pressor testing. Normal postganglionic sympathetic sudomotor axon function by QSWEAT.   Echocardiogram 02/03/2022:  1. Peak gradient of 33 mmHg despite mildly decrease LV stroke volume. DVI 0.26. Gradients were obtained ~ 1 minute after contrast administration with no contrast in the LV cavity. 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 The  aortic valve is calcified. There is moderate calcification of the aortic valve. There is moderate thickening of the aortic valve. Aortic valve regurgitation is mild. Severe aortic valve stenosis. Aortic  valve area, by VTI measures 0.80 cm. Aortic valve  mean gradient measures 33.0 mmHg. Aortic valve Vmax measures 3.66 m/s.  2. Left ventricular ejection fraction, by estimation, is 55 to 60%. The left ventricle has normal function. The left ventricle has no regional wall motion abnormalities. The  left ventricular internal cavity size was mildly dilated. Left ventricular  diastolic parameters are indeterminate.  3. Right ventricular systolic function was not well visualized. The right ventricular size is not well visualized.  4. The mitral valve is grossly normal. No evidence of mitral valve regurgitation. No evidence of mitral stenosis.  5. The inferior vena cava is dilated in size with >50% respiratory variability, suggesting right atrial pressure of 8 mmHg  Coronary /Chest/Abd CT  TAVR protocol 02/13/2022: 1. Trileaflet aortic valve with moderate calcifications (AV calcium score 1194). AV calcium score and visual evaluation of valve opening suggest more likely moderate aortic stenosis 2. Aortic annulus measures 28m x 252min diameter with perimeter 74 mm and area 417 mm^2. No annular or LVOT calcifications. Annular measurements are suitable for delivery of 2384mdwards Sapien 3 valve. 3. Sufficient coronary to annulus distance. 4. Optimum Fluoroscopic Angle for Delivery:  LAO 17 CAU 8 5. S/p CABG with patent LIMA-LAD, SVG-RCA, and SVG-OM1  Right left Heart Catheterization 02/13/22:  RA 8/7, mean 6 mmHg. RV 47/1, EDP 10 mmHg; PA 43/14, mean 25 mmHg.   PA saturation 59%.  Aortic saturation 98%.  PW 8/8, mean 7 mmHg. QP/QS 1.00.  CO 5.91/CI 2.73 LV: 131/3, EDP 19 mmHg.  Ao 1 and 14/42, mean 67 mmHg.  Peak to peak pressure gradient 23 and mean pressure gradient 30 mmHg.  Calculated aortic valve area 1.22 cm. LV gram not performed. LM: Large-caliber vessel, normal. LAD: Gives origin to a very large D1 and D2, has ostial 20 to 30% stenosis in the diagonals.  LAD itself is very mild disease.   Distal LAD has come to the feeling from the LIMA to LAD. LIMA to LAD: Widely patent. LCx: Proximal segment has a 40 to 50% stenosis.  Distal CX is occluded.  SVG to OM1-distal CX jump graft: Widely patent.   Impression: Widely patent native vessels on the left system, no significant stenosis was identified however has been grafted.  Only lesion that was felt to be significant was distal circumflex which is occluded.  Right coronary artery has a prior stent that is occluded, very large RCA which is now bypassed and graft retrogradely fills the native RCA all the way back to the stent at the ostium.  70 mL contrast utilized.  CABG x3 (rSVG-OM1, rSVG-distal RCA, LIMA-distal LAD) on 09/07/2017   Scheduled Meds:  DULoxetine  90 mg Oral Daily   fluticasone furoate-vilanterol  1 puff Inhalation Daily   And   umeclidinium bromide  1 puff Inhalation Daily   gabapentin  600 mg Oral BID   metoprolol tartrate  12.5 mg Oral BID   pantoprazole  40 mg Oral BID   rosuvastatin  10 mg Oral QPM   sodium chloride flush  3 mL Intravenous Q12H   sodium chloride flush  3 mL Intravenous Q12H   tamsulosin  0.4 mg Oral QHS   topiramate  75 mg Oral TID   torsemide  20 mg Oral Daily   Continuous Infusions:  sodium chloride     PRN Meds:.sodium chloride, acetaminophen **OR** acetaminophen, HYDROcodone-acetaminophen, ipratropium-albuterol, metoprolol tartrate, sodium chloride, sodium chloride flush  Assessment  Jose [name] a [age]o. 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  1.  Cardiogenic syncope versus combination of deconditioning and gait instability 2.  Moderate aortic stenosis 3.  Coronary artery disease of the native vessel without angina pectoris 4.  Primary hypertension 5.  Morbid obesity with obesity hypoventilation 6.  Asymptomatic bilateral carotid artery stenosis, carotid duplex reviewed from 12/06/2020, 50 to 69% stenosis right and left <15 to 49% stenosis with <50% stenosis of the left external carotid artery.  Bilateral antegrade vertebral artery flow.  Plan:   I have done extensive review of the charts from [facility] records reviewed.  I do not think he has severe aortic stenosis.  Aortic stenosis is moderate at most and TAVR will not be effective in preventing his frequent fall, episodes of fall where he is completely conscious, feels weak in his legs and gets down to the ground and sometimes he also states that when he falls he does not remember how he fell.  He may have a component of cardiogenic syncope related to conduction system disease versus deconditioning and it is extremely difficult to delineate his symptoms.  I recommend loop recorder implantation and cancel TAVR at this point.  I have also had a curbside discussion with Dr. Phineas Inches ([place]), reviewed AV calcification score, LVOT dimentison on the Echo and also Cardiac cath report and all of them suggest at most moderate AS.  I also made the patient walk in the hallway to see whether we could induce any of his symptoms.  Patient ambulated 200 m without any chest pain, did have mild dyspnea that is chronic but did not have any arrhythmias or heart block.  Patient was initially opposed to walking, advised him that we do not have a diagnosis.  Patient was willing to walk and walk with the help of nursing closely  observing him.  From coronary artery disease standpoint, he is stable.  Blood pressure is well-controlled.  No orthostasis.  After CABG, patient states that for almost 2 years he was bedbound or nearly bedbound or minimally ambulatory due to seroma involving his left leg vein harvest site.  I suspect part of deconditioning also to be the etiology.  He will need carotid artery duplex for surveillance.  Will continue observation in the telemetry for today and tomorrow, please loop recorder on Monday and discharged home Monday afternoon or post loop recorder implantation.  Telemetry will also give [place] an opportunity to watch for any heart block.  This was a total of 90 minutes of hospital visit encounter and evaluation of his medical records, coordination of care, discussion with floor nurses, discussion with the patient.    Adrian Prows, MD, [facility] 02/14/2022, 5:50 PM Office: [phone] Fax: [phone] Pager: [phone]

## 2022-02-14 NOTE — Significant Event (Addendum)
I got a call from radiology-CT head was repeated with plan to resume coumadin- but Hemorrhage expanded   CT report:"1. Previous area of hemorrhagic contusion in the anterior right frontal lobe has expanded. The parenchymal hemorrhage now measures 2.3 x 1.7 x 1.4 cm with increased surrounding edema. 2. Subarachnoid and subdural hemorrhage along the falx is similar the prior exam. 3. Stable atrophy and white matter disease"  I notified Neurosurgery [facility]) Team and are aware about the findings- await further plan from neurosurgery, hold off on resuming coumadin.  We will place the patient on neurochecks.  Informed by Dr. Jordan Likes who reviewed the CT scan > Dr. Jordan Likes informed hemorrhage is insignificant as of now , advised to hold off Coumadin> repeat CT scan Monday.  I requested neurosurgery to follow-up closely. His ABG had shown hypercapnia 12/1-has been refusing to be on BiPAP using [place] only.will order BiPAP for the night. He has some difficulty with memory at baseline. Dr. Jacinto Halim had just seen him this evening- he had extensive discussion spinning around  hour or more per him. I came and examined him at 1835: Alert awake conversant, moving all extremities well no double vision no diplopia, pupils equally reactive and round denies any headache. He understands the diagnosis and the difficult situation He understands that we are holding Coumadin Given his worsening intracranial bleeding and he is at risk of getting PE/DVT.  He states " I do not want to get more bleeding" He understand that he will have to make it difficult choice of whether to be on Coumadin or not - but awaiting neurosurgery for repeat ct and further recommendations. I tried to call his spouse- no answer in [phone]-no answer unable to leave voice message I informed Rahshawn, that I was unable to reach her-he is going to call himself

## 2022-02-14 NOTE — Evaluation (Signed)
Occupational Therapy Evaluation Patient Details Name: Jose [name] MRN: [id number] DOB: 04-30-1955 Today's Date: 02/14/2022   History of Present Illness Pt is a 66 y/o male presenting on 11/30 after fall. CT head with small subarachnoid hemorrhage at superior medial right convexity. Noted multiple hospitalizations for falls/syncope in 01/2022. Plan for diagnostic right and L heart cath, TAVR evaluation 12/1. PMH includes: CAD s/p CABG, aortic valve stenosis, PVD, COPD, chronic respiratory failure on 4-6L O2, PE, T2DM, syncope.   Clinical Impression   Pt reports needing PRN assist from spouse for LB ADLs, no AD use. Pt lives with spouse who can assist at d/c. Pt currently needing min-max A for ADLs, min A for bed mobility, and min guard-min A for transfers with RW. VSS on 4L O2 throughout session. Pt with decreased cognition, needing increased cues for task sequencing. Evaluation limited as transport in room to take pt to CT. Pt presenting with impairments listed below, will follow acutely. Recommend HHOT at d/c.     Recommendations for follow up therapy are one component of a multi-disciplinary discharge planning process, led by the attending physician.  Recommendations may be updated based on patient status, additional functional criteria and insurance authorization.   Follow Up Recommendations  Home health OT     Assistance Recommended at Discharge Intermittent Supervision/Assistance  Patient can return home with the following Assist for transportation;Assistance with cooking/housework;Help with stairs or ramp for entrance;A little help with walking and/or transfers;A little help with bathing/dressing/bathroom    Functional Status Assessment  Patient has had a recent decline in their functional status and demonstrates the ability to make significant improvements in function in a reasonable and predictable amount of time.  Equipment Recommendations  BSC/3in1    Recommendations for Other  Services PT consult     Precautions / Restrictions Precautions Precautions: Fall;Other (comment) Precaution Comments: Recurrent syncope & falls, orthostatic Restrictions Weight Bearing Restrictions: No      Mobility Bed Mobility Overal bed mobility: Needs Assistance Bed Mobility: Supine to Sit     Supine to sit: Min assist          Transfers Overall transfer level: Needs assistance Equipment used: Rolling walker (2 wheels) Transfers: Sit to/from Stand Sit to Stand: Min guard, Min assist     Step pivot transfers: Min guard, Min assist     General transfer comment: step pivot transfer x2 during session      Balance Overall balance assessment: Needs assistance Sitting-balance support: Feet supported Sitting balance-[name] Scale: Good     Standing balance support: Bilateral upper extremity supported, During functional activity, Reliant on assistive device for balance Standing balance-[name] Scale: Poor Standing balance comment: Reliant on RW                           ADL either performed or assessed with clinical judgement   ADL Overall ADL's : Needs assistance/impaired Eating/Feeding: Set up   Grooming: Set up   Upper Body Bathing: Minimal assistance   Lower Body Bathing: Moderate assistance   Upper Body Dressing : Minimal assistance   Lower Body Dressing: Moderate assistance   Toilet Transfer: Minimal assistance;Rolling walker (2 wheels);Stand-pivot;BSC/3in1   Toileting- Architect and Hygiene: Maximal assistance Toileting - Clothing Manipulation Details (indicate cue type and reason): pericare     Functional mobility during ADLs: Minimal assistance;Rolling walker (2 wheels)       Vision   Additional Comments: will further assess     Perception Perception  Perception Tested?: No   Praxis Praxis Praxis tested?: Not tested    Pertinent Vitals/Pain Pain Assessment Pain Assessment: Faces Pain Score: 6  Faces Pain  Scale: Hurts even more Pain Location: L ribs, R buttocks Pain Descriptors / Indicators: Discomfort, Grimacing, Moaning, Guarding Pain Intervention(s): Limited activity within patient's tolerance, Monitored during session     Hand Dominance Right   Extremity/Trunk Assessment Upper Extremity Assessment Upper Extremity Assessment: Generalized weakness   Lower Extremity Assessment Lower Extremity Assessment: Defer to PT evaluation   Cervical / Trunk Assessment Cervical / Trunk Assessment: Normal   Communication     Cognition Arousal/Alertness: Awake/alert Behavior During Therapy: WFL for tasks assessed/performed Overall Cognitive Status: No family/caregiver present to determine baseline cognitive functioning Area of Impairment: Awareness, Problem solving, Attention                   Current Attention Level: Selective       Awareness: Emergent Problem Solving: Difficulty sequencing, Requires verbal cues, Slow processing General Comments: increased cuing needed for task sequencing, needing repetition, 2/3 word recall after 3 mins, pt A & O x4     General Comments  VSS on 4L O2, BP 114/54 after first transfer    Exercises     Shoulder Instructions      Home Living Family/patient expects to be discharged to:: Private residence Living Arrangements: Spouse/significant other Available Help at Discharge: Family;Available PRN/intermittently Type of Home: House Home Access: Stairs to enter [employer] of Steps: 3-4 Entrance Stairs-Rails: Right;Left Home Layout: One level     Bathroom Shower/Tub: Tub/shower unit;Walk-in shower   Bathroom Toilet: Standard Bathroom Accessibility: Yes How Accessible: Accessible via wheelchair Home Equipment: Rollator (4 wheels)          Prior Functioning/Environment               Mobility Comments: Per pt he is modified independent with household ambulation without assistive device. Frequent syncope. Sleeps in  recliner ADLs Comments: Assist as needed for lower body ADL, spouse assists with iADL and community mobility.        OT Problem List: Decreased activity tolerance;Impaired balance (sitting and/or standing);Increased edema      OT Treatment/Interventions: Self-care/ADL training;Therapeutic exercise;Energy conservation;DME and/or AE instruction;Therapeutic activities;Patient/family education;Balance training    OT Goals(Current goals can be found in the care plan section) Acute Rehab OT Goals Patient Stated Goal: none stated OT Goal Formulation: With patient Time For Goal Achievement: 02/28/22 Potential to Achieve Goals: Good ADL Goals Pt Will Perform Upper Body Dressing: with modified independence;sitting Pt Will Perform Lower Body Dressing: with min guard assist;sitting/lateral leans;sit to/from stand Pt Will Transfer to Toilet: with modified independence;ambulating;regular height toilet Pt Will Perform Tub/Shower Transfer: Tub transfer;Shower transfer;with supervision;ambulating;shower seat;rolling walker  OT Frequency: Min 2X/week    Co-evaluation              AM-PAC OT "6 Clicks" Daily Activity     Outcome Measure Help from another person eating meals?: None Help from another person taking care of personal grooming?: A Little Help from another person toileting, which includes using toliet, bedpan, or urinal?: A Little Help from another person bathing (including washing, rinsing, drying)?: A Lot Help from another person to put on and taking off regular upper body clothing?: None Help from another person to put on and taking off regular lower body clothing?: A Lot 6 Click Score: 18   End of Session Equipment Utilized During Treatment: Gait belt;Rolling walker (2 wheels);Oxygen Nurse Communication: Mobility  status  Activity Tolerance: Patient tolerated treatment well Patient left: in bed;with call bell/phone within reach;with bed alarm set;Other (comment) (with transport  going to CT)  OT Visit Diagnosis: Unsteadiness on feet (R26.81);Other abnormalities of gait and mobility (R26.89);Muscle weakness (generalized) (M62.81)                Time: 0223-3612 OT Time Calculation (min): 17 min Charges:  OT General Charges $OT Visit: 1 Visit OT Evaluation $OT Eval Moderate Complexity: 1 Mod  [place] Pindell K, OTD, OTR/L SecureChat Preferred Acute Rehab [phone]  Raynelle Fujikawa K Koonce 02/14/2022, 3:12 PM

## 2022-02-14 NOTE — Progress Notes (Signed)
PROGRESS NOTE Jose [name]  [id number] DOB: 1955/06/21 DOA: 02/12/2022 PCP: Elenore Paddy, MD   Brief Narrative/Hospital Course: 66 y.o. 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 -again admitted at [facility] 11/20-11/24  s/p fall/syncopal episode at home acutely altered requiring intubation and admitted into the ICU with concern for aspiration pneumonia.  Tracheal aspirate grew MRSA for which patient was transition to linezolid to treat for total 7 days patient had self extubated his self on 11/22.  After patient was noted to be stable he reports he felt like he was transferred to the floor and discharged home the next day despite neurology recommending him to be formally evaluated by cardiology at that time. He has been at home for 4 days and he states it took 2 days to obtain Linezolid meds.He reports having 2 more doses left to take to complete the 7-day course recommended. Again over the last 24 hours had 2 separate syncopal events: " during first 1 his wife noted that she was able to get him up from the floor.  However second 1 that occurred 11.29 he fell in between the countertop and the refrigerator leaving a dent there.  Patient  remembers walking to the fridge prior to coming to the hospital. Patient was bleeding from his nose and ultimately she called 911 the patient evaluated.   In ED: afebrile with pulse 74-107, respirations 13-25, blood pressure 102/45-147/71, and O2 saturations currently maintained on home 4 L of oxygen.  CT scan of the head, cervical spine, chest, abdomen, pelvis, and lumbar spine DONE: CT scan of the head noted small area of subarachnoid hemorrhage at the superior medial right convexity.  Labs were noted to be stable.  Neurosurgery have been consulted and recommended serial CT exams. The next CT scan of the brain noted slight interval increase in size of the small subarachnoid hemorrhage without mass effect or midline shift.  Cardiology was consulted for syncope. Patient admitted for further management  12/1 cardaic cath: Widely patent native vessels on the left system, no significant stenosis was identified however has been grafted. Only lesion that was felt to be significant was distal circumflex which is occluded. Right coronary artery has a prior stent that is occluded, very large RCA which is now bypassed and graft retrogradely fills the native RCA all the way back to the stent at the ostium.     Subjective: Patient seen and examined this morning.  Again on his recliner as usual Has no complaints no headache double vision numbness tingling focal weakness Overnight BP stable in 1 1 3-1 1 6, on 3 L nasal cannula, heart rate stable Labs reviewed this morning stable BMP hemoglobin 7.8 g Underwent cardiac cath yesterday   Assessment and Plan: Principal Problem:   Traumatic subarachnoid  hemorrhage (HCC) Active Problems:   Severe aortic stenosis   Cardiac syncope   COPD (chronic obstructive pulmonary disease) (HCC)   Chronic respiratory failure with hypoxia and hypercapnia (HCC)   Essential hypertension   Antiphospholipid syndrome (HCC)   Sleep apnea   History of pulmonary embolus (PE)-2011    Type 2 diabetes mellitus with hyperlipidemia (HCC)   Obesity (BMI 30-39.9)   CAD (coronary artery disease)   Small traumatic SAH while on anticoagulation with Coumadin: Neurosurgery input appreciated> advised to repeat CT head today, after which can resume Coumadin slowly discussed with Dr. Jacinto Halim would prefer not to bridge.  He will follow-up with [facility] as outpatient.    Recurrent syncope/fall episodes: Unclear etiology Severe paradoxical low flow low gradient aortic stenosis: Seen by cardiology possible AS contributing to his symptoms however underwent left and right heart cardiac catheterization and discussed with Dr. Jacinto Halim he feels unlikely TAVR will resolve his syncope based upon his finding> await further input from cardiology.  Underwent multiple CT scan 12/2  COPD with chronic respiratory failure-on 4 to 6 L [place] and trilogy BiPAP at home.  Continue supplemental oxygen,does not want BiPAP or CPAP while here.  Continue inhalers/nebulizer and monitor respiratory status.  Ant 7th rib fracture on CT chest: Continue pain control  CAD with history of PCI stent, CABG x3:continue statin metoprolol..Hopefully resume Coumadin today.   Essential hypertension: BP well controlled on metoprolol. HLD continue with Crestor 10 mg. Recent aspiration pneumonia: Was recently hospitalized at [facility] completed Zyvox 12/1.  Normocytic anemia: Hemoglobin: 9 to 10 g.  Slightly drifting patient has had blood transfusion for hemoglobin around low 7 on recent admission in November.  Continue his Flomax/Cymbalta/Neurontin Topamax  History of PE in 2011 History of antiphospholipid antibody syndrome on chronic Coumadin: Goal as low as 2.0 due to recurrent falls- was seen by Dr Ignacia Marvel on 01/23/22 at [facility]- there is a concern about his antiphospholipid Dx> repeat blood work was done and possible consideration to transition to DOAC due to low risk Dx>  plan was to follow up outpatient with him  w/. APS labs- came back at Anticardiolipin IgG 26 ( normal 0-14), Anticardiolopin IgM normal, hexagonal phase phospholipid up 46 ( 0-11 normal), dRVVT Mix 62 ( nl 0-40.4s), dDRVT Confirm up 2.0 ( nl 0.8-1.2), beta2 glycoprotein igG,I gM normal, lupus anticoagulant consistent with lupus anticoagulant.CTA PE 11/30: neg for PE, ant 7th rib fracture.  Per neurosurgery recommendation okay to resume Coumadin if CT head is stable today pharmacy will be consulted to dose.  INR mildly subtherapeutic as below Recent Labs  Lab 02/12/22 0035 02/12/22 2049 02/13/22 0710  INR 2.2* 2.2* 1.8*    T2DM: Poorly controlled blood sugar in 200, last A1c normal 5.6 in 02/02/2022.  Continue on diet control.  Holding OHA.Monitor. cbg 130 thsi am  BPH: Cont Flomax  Class II Obesity:Patient's Body mass index is 36.95 kg/m. : Will benefit with PCP follow-up, weight loss  healthy lifestyle and outpatient sleep evaluation.  DVT prophylaxis: Place and maintain sequential compression device Start: 02/12/22 1734 SCDs Start: 02/12/22 1112 Code Status:   Code Status: Full Code Family Communication: plan of care discussed with patient at bedside. Patient status is: Admitted as observation, remains hospitalized  due to SAH/recurrent syncope and fOR cardiac work-up  Level of care: Progressive  Dispo: The patient is from: home            Anticipated disposition: TBD.  Will obtain PT OT evaluation Objective: Vitals  last 24 hrs: Vitals:   02/13/22 2200 02/13/22 2322 02/14/22 0310 02/14/22 0820  BP: 128/75 123/67 (!) 116/55 (!) 113/54  Pulse: 69 73 72 73  Resp: 18 20 20  (!) 21  Temp:  98 F (36.7 C) 97.8 F (36.6 C) 97.8 F (36.6 C)  TempSrc:  Oral Oral   SpO2: 100% 97% 99% 97%  Weight:      Height:       Weight change:   Physical Examination: General exam: AAOX3, weak,older appearing HEENT:Oral mucosa moist, Ear/Nose WNL grossly, dentition normal. Respiratory system: bilaterally diminished BS, no use of  accessory muscle Cardiovascular system: S1 & S2 +, regular rate, JVD NEG. Gastrointestinal system: Abdomen soft, obese,NT,ND,BS+ Nervous System:Alert, awake, moving extremities and grossly nonfocal Extremities: LE ankle edema MILD chronic appearing, lower extremities warm Skin: No rashes,no icterus. MSK: Normal muscle bulk,tone, power   Medications reviewed:  Scheduled Meds:  DULoxetine  90 mg Oral Daily   fluticasone furoate-vilanterol  1 puff Inhalation Daily   And   umeclidinium bromide  1 puff Inhalation Daily   gabapentin  600 mg Oral BID   metoprolol tartrate  12.5 mg Oral BID   pantoprazole  40 mg Oral BID   rosuvastatin  10 mg Oral QPM   sodium chloride flush  3 mL Intravenous Q12H   sodium chloride flush  3 mL Intravenous Q12H   tamsulosin  0.4 mg Oral QHS   topiramate  75 mg Oral TID   torsemide  20 mg Oral Daily   Continuous Infusions:  sodium chloride      Diet Order             Diet Heart Room service appropriate? Yes; Fluid consistency: Thin  Diet effective now                  Intake/Output Summary (Last 24 hours) at 02/14/2022 0857 Last data filed at 02/14/2022 4403 Gross per 24 hour  Intake 1724.67 ml  Output 2125 ml  Net -400.33 ml    Net IO Since Admission: -600.33 mL [02/14/22 0857]  Wt Readings from Last 3 Encounters:  02/12/22 107 kg  02/05/22 107.8 kg  01/22/22 111.7 kg     Unresulted Labs (From admission, onward)     Start     Ordered   02/13/22 0500  Basic metabolic panel  Daily at 5am,   R      02/12/22 1217   02/13/22 0500  CBC  Daily at 5am,   R      02/12/22 1217          Data Reviewed: I have personally reviewed following labs and imaging studies CBC: Recent Labs  Lab 02/12/22 0035 02/12/22 0051 02/12/22 2049 02/13/22 0148 02/13/22 1446 02/13/22 1447 02/13/22 1455 02/14/22 0110  WBC 9.3  --  8.9 9.7  --   --   --  9.0  HGB 9.2*   < > 9.2* 9.2* 8.8* 8.8* 8.8* 7.8*  HCT 31.3*   < > 31.1* 32.1* 26.0* 26.0* 26.0*  26.8*  MCV 94.6  --  93.4 96.4  --   --   --  96.4  PLT 308  --  297 253  --   --   --  234   < > = values in this interval not displayed.    Basic Metabolic Panel: Recent Labs  Lab 02/12/22 0035 02/12/22 0051 02/13/22 0148 02/13/22 1446 02/13/22 1447 02/13/22 1455 02/14/22 0110  NA 140 139 139 142  142 142 140  K 4.1 4.1 4.5 4.0 4.0 4.0 3.9  CL 100 104 104  --   --   --  107  CO2 27  --  27  --   --   --  26  GLUCOSE 94 89 137*  --   --   --  130*  BUN 14 15 10   --   --   --  11  CREATININE 0.83 0.90 0.92  --   --   --  0.95  CALCIUM 9.0  --  8.6*  --   --   --  8.2*    GFR: Estimated Creatinine Clearance: 89.3 mL/min (by C-G formula based on SCr of 0.95 mg/dL). Liver Function Tests: Recent Labs  Lab 02/12/22 0035  AST 20  ALT 27  ALKPHOS 66  BILITOT 0.3  PROT 7.1  ALBUMIN 3.3*    No results for input(s): "LIPASE", "AMYLASE" in the last 168 hours. No results for input(s): "AMMONIA" in the last 168 hours. Coagulation Profile: Recent Labs  Lab 02/12/22 0035 02/12/22 2049 02/13/22 0710  INR 2.2* 2.2* 1.8*   No results found for this or any previous visit (from the past 240 hour(s)).  Antimicrobials: Anti-infectives (From admission, onward)    Start     Dose/Rate Route Frequency Ordered Stop   02/12/22 2200  linezolid (ZYVOX) tablet 600 mg  Status:  Discontinued        600 mg Oral 2 times daily 02/12/22 1728 02/12/22 1739   02/12/22 2200  linezolid (ZYVOX) tablet 600 mg        600 mg Oral 2 times daily 02/12/22 1739 02/13/22 0912      Culture/Microbiology    Component Value Date/Time   SDES TRACHEAL ASPIRATE 02/03/2022 0727   SPECREQUEST NONE 02/03/2022 0727   CULT  02/03/2022 0727    ABUNDANT METHICILLIN RESISTANT STAPHYLOCOCCUS AUREUS   REPTSTATUS 02/05/2022 FINAL 02/03/2022 0727   Radiology Studies: CT CORONARY MORPH W/CTA COR W/SCORE W/CA W/CM &/OR WO/CM  Addendum Date: 02/13/2022   ADDENDUM REPORT: 02/13/2022 17:53 CLINICAL DATA:  39M with  possible severe aortic stenosis being evaluated for a TAVR procedure. EXAM: Cardiac TAVR CT TECHNIQUE: The patient was scanned on a [employer]. A 120 kV retrospective scan was triggered in the descending thoracic aorta at 111 HU's. Gantry rotation speed was 250 msecs and collimation was .6 mm. No beta blockade or nitro were given. The 3D data set was reconstructed in 5% intervals of the R-R cycle. Systolic and diastolic phases were analyzed on a dedicated work station using MPR, MIP and VRT modes. The patient received 100 cc of contrast. FINDINGS: Aortic Root: Aortic valve: Trileaflet Aortic valve calcium score: 1194 Aortic annulus: Diameter: 27mm x 8mm Perimeter: 45mm Area: 417 mm^2 Calcifications: No calcifications Coronary height: Min Left - 50mm,  Min Right - 48mm Sinotubular height: Left cusp - 35mm; Right cusp - 15mm; Noncoronary cusp - 100mm LVOT (as measured 3 mm below the annulus): Diameter: 71mm x 29mm Area: 403 mm^2 Calcifications: No calcifications Aortic sinus width: Left cusp - 66mm; Right cusp - 62mm; Noncoronary cusp -54mm Sinotubular junction width: 18mm x 8mm Optimum Fluoroscopic Angle for Delivery: LAO 17 CAU 8 Cardiac: Right atrium: Normal size Right ventricle: Moderate dilatation Pulmonary arteries: Normal size Pulmonary veins: Normal configuration Left atrium: Mild enlargement Left ventricle: Moderate dilatation Pericardium: Normal thickness Coronary arteries: S/p CABG with patent LIMA-LAD, SVG-RCA, and SVG-OM1 IMPRESSION: 1. Trileaflet aortic valve with moderate calcifications (AV calcium  score 1194). AV calcium score and visual evaluation of valve opening suggest more likely moderate aortic stenosis 2. Aortic annulus measures 76mm x 46mm in diameter with perimeter 56mm and area 417 mm^2. No annular or LVOT calcifications. Annular measurements are suitable for delivery of 7mm Edwards Sapien 3 valve. 3. Sufficient coronary to annulus distance. 4. Optimum Fluoroscopic Angle for  Delivery:  LAO 17 CAU 8 5. S/p CABG with patent LIMA-LAD, SVG-RCA, and SVG-OM1 Electronically Signed   By: Oswaldo Milian M.D.   On: 02/13/2022 17:53   Result Date: 02/13/2022 EXAM: OVER-READ INTERPRETATION  CT CHEST The following report is a limited chest CT over-read performed by radiologist Dr. Salvatore Marvel of [facility], [place] on 02/13/2022. This over-read does not include interpretation of cardiac or coronary anatomy or pathology. The cardiac CTA interpretation by the cardiologist is attached. COMPARISON:  02/12/2022 chest CT angiogram. FINDINGS: Please see the separate concurrent chest CT angiogram report for details. IMPRESSION: Please see the separate concurrent chest CT angiogram report for details. Electronically Signed: By: Ilona Sorrel M.D. On: 02/13/2022 10:46   CARDIAC CATHETERIZATION  Result Date: 02/13/2022 Images from the original result were not included. Right left Heart Catheterization 02/13/22: RA 8/7, mean 6 mmHg. RV 47/1, EDP 10 mmHg; PA 43/14, mean 25 mmHg.  PA saturation 59%.  Aortic saturation 98%.  PW 8/8, mean 7 mmHg. QP/QS 1.00.  CO 5.91/CI 2.73 LV: 131/3, EDP 19 mmHg.  Ao 1 and 14/42, mean 67 mmHg.  Peak to peak pressure gradient 23 and mean pressure gradient 30 mmHg.  Calculated aortic valve area 1.22 cm. LV gram not performed. LM: Large-caliber vessel, normal. LAD: Gives origin to a very large D1 and D2, has ostial 20 to 30% stenosis in the diagonals.  LAD itself is very mild disease.  Distal LAD has come to the feeling from the LIMA to LAD. LIMA to LAD: Widely patent. LCx: Proximal segment has a 40 to 50% stenosis.  Distal CX is occluded. SVG to OM1-distal CX jump graft: Widely patent. Impression: Widely patent native vessels on the left system, no significant stenosis was identified however has been grafted.  Only lesion that was felt to be significant was distal circumflex which is occluded.  Right coronary artery has a prior stent that is occluded, very large RCA  which is now bypassed and graft retrogradely fills the native RCA all the way back to the stent at the ostium.  70 mL contrast utilized. Adrian Prows, MD, [facility] 02/13/2022, 4:22 PM Office: [phone] Fax: [phone] Pager: [phone]   CT ANGIO ABDOMEN PELVIS  W &/OR WO CONTRAST  Result Date: 02/13/2022 CLINICAL DATA:  Inpatient. Aortic valve replacement (TAVR), pre-op eval aortic stenosis; Aortic valve replacement (TAVR), pre-op eval EXAM: CT ANGIOGRAPHY CHEST, ABDOMEN AND PELVIS TECHNIQUE: Multidetector CT imaging through the chest, abdomen and pelvis was performed using the standard protocol during bolus administration of intravenous contrast. Multiplanar reconstructed images and MIPs were obtained and reviewed to evaluate the vascular anatomy. RADIATION DOSE REDUCTION: This exam was performed according to the departmental dose-optimization program which includes automated exposure control, adjustment of the mA and/or kV according to patient size and/or use of iterative reconstruction technique. CONTRAST:  141mL OMNIPAQUE IOHEXOL 350 MG/ML SOLN COMPARISON:  02/12/2022 chest CT angiogram and CT abdomen/pelvis. FINDINGS: CTA CHEST FINDINGS Cardiovascular: Borderline mild cardiomegaly. Diffuse thickening and coarse calcification of the aortic valve. No significant pericardial effusion/thickening. Three-vessel coronary atherosclerosis status post CABG. Atherosclerotic nonaneurysmal thoracic aorta. Normal caliber pulmonary arteries. No central pulmonary  emboli. Mediastinum/Nodes: No significant thyroid nodules. Unremarkable esophagus. No axillary adenopathy. Mildly enlarged 1.0 cm right paratracheal node (series 7/image 41), decreased from 1.3 cm on 02/02/2022 CT. No additional pathologically enlarged mediastinal nodes. No hilar adenopathy. Lungs/Pleura: No pneumothorax. No pleural effusion. Patchy tree-in-bud opacities and nodular 1.5 cm focus of consolidation in the right lower lobe (series 8/image 131), new  from 02/02/2022 CT and not appreciably changed from 02/12/2022 CT. Tiny 0.2 cm apical right upper lobe nodule (series 8/image 55), stable since 07/13/2020 CT, considered benign. Musculoskeletal: No aggressive appearing focal osseous lesions. Intact sternotomy wires. Moderate thoracic spondylosis. CTA ABDOMEN AND PELVIS FINDINGS Hepatobiliary: Normal liver with no liver mass. Normal gallbladder with no radiopaque cholelithiasis. No biliary ductal dilatation. Pancreas: Normal, with no mass or duct dilation. Spleen: Normal size. No mass. Adrenals/Urinary Tract: Normal adrenals. No hydronephrosis. Nonobstructing 11 mm interpolar right and 3 mm interpolar left renal stones. No contour deforming renal masses. Normal bladder. Stomach/Bowel: Normal non-distended stomach. Normal caliber small bowel with no small bowel wall thickening. Appendectomy. Mild scattered colonic diverticulosis with no large bowel wall thickening or significant pericolonic fat stranding. Vascular/Lymphatic: Atherosclerotic nonaneurysmal abdominal aorta. Patent renal, portal and splenic veins. Mildly enlarged 1.3 cm right external iliac lymph node (series 7/image 192) and mildly enlarged 1.0 cm left external iliac lymph node (series 7/image 183), both stable since 02/02/2022 CT. No pathologically enlarged abdominal lymph nodes. Reproductive: Normal size prostate. Other: No pneumoperitoneum, ascites or focal fluid collection. Medial right gluteal subcutaneous hyperdense focus measuring 3.7 cm thickness (series 7/image 178), unchanged since 02/02/2022 CT, new from 01/20/2020 CT, most compatible with a subcutaneous hematoma. Musculoskeletal: No aggressive appearing focal osseous lesions. Moderate lumbar spondylosis. VASCULAR MEASUREMENTS PERTINENT TO TAVR: AORTA: Minimal Aortic Diameter-10.5 x 6.7 mm Severity of Aortic Calcification-moderate-to-severe RIGHT PELVIS: Right Common Iliac Artery - Minimal Diameter-7.0 x 5.2 mm Tortuosity-mild-to-moderate  Calcification-severe Right External Iliac Artery - Minimal Diameter-6.6 x 5.1 mm Tortuosity-mild to moderate Calcification-moderate to severe Right Common Femoral Artery - Minimal Diameter-7.3 x 4.7 mm Tortuosity-mild Calcification-moderate to severe LEFT PELVIS: Left Common Iliac Artery - Minimal Diameter-7.3 x 6.2 mm (measured on series 6/image 459 as [employer] had difficulty with the center line at this site) Tortuosity-moderate Calcification-severe Left External Iliac Artery - Minimal Diameter-6.2 x 6.1 mm Tortuosity-moderate Calcification-moderate Left Common Femoral Artery - Minimal Diameter-7.2 x 6.0 mm Tortuosity-mild Calcification-moderate Review of the MIP images confirms the above findings. IMPRESSION: 1. Vascular findings and measurements pertinent to potential TAVR procedure, as detailed. 2. Diffuse thickening and coarse calcification of the aortic valve, compatible with the reported history of aortic stenosis. 3. Borderline mild cardiomegaly. 4. Patchy tree-in-bud opacities and nodular 1.5 cm focus of consolidation in the right lower lobe, new from 02/02/2022 CT and not appreciably changed from 02/12/2022 CT, most compatible with a nonspecific mild bronchopneumonia such as due to aspiration. Recommend attention on follow-up chest CT in 3 months. 5. Nonspecific mild mediastinal and bilateral external iliac lymphadenopathy, stable since 02/02/2022 CT. Recommend attention on follow-up CT chest, abdomen and pelvis with oral and IV contrast in 3 months. 6. Stable subcutaneous hematoma in the medial right gluteal region. 7. Nonobstructing bilateral nephrolithiasis. 8. Mild scattered colonic diverticulosis. 9.  Aortic Atherosclerosis (ICD10-I70.0). Electronically Signed   By: Ilona Sorrel M.D.   On: 02/13/2022 12:14   CT ANGIO CHEST AORTA W/CM & OR WO/CM  Result Date: 02/13/2022 CLINICAL DATA:  Inpatient. Aortic valve replacement (TAVR), pre-op eval aortic stenosis; Aortic valve replacement (TAVR), pre-op  eval EXAM: CT ANGIOGRAPHY  CHEST, ABDOMEN AND PELVIS TECHNIQUE: Multidetector CT imaging through the chest, abdomen and pelvis was performed using the standard protocol during bolus administration of intravenous contrast. Multiplanar reconstructed images and MIPs were obtained and reviewed to evaluate the vascular anatomy. RADIATION DOSE REDUCTION: This exam was performed according to the departmental dose-optimization program which includes automated exposure control, adjustment of the mA and/or kV according to patient size and/or use of iterative reconstruction technique. CONTRAST:  176mL OMNIPAQUE IOHEXOL 350 MG/ML SOLN COMPARISON:  02/12/2022 chest CT angiogram and CT abdomen/pelvis. FINDINGS: CTA CHEST FINDINGS Cardiovascular: Borderline mild cardiomegaly. Diffuse thickening and coarse calcification of the aortic valve. No significant pericardial effusion/thickening. Three-vessel coronary atherosclerosis status post CABG. Atherosclerotic nonaneurysmal thoracic aorta. Normal caliber pulmonary arteries. No central pulmonary emboli. Mediastinum/Nodes: No significant thyroid nodules. Unremarkable esophagus. No axillary adenopathy. Mildly enlarged 1.0 cm right paratracheal node (series 7/image 41), decreased from 1.3 cm on 02/02/2022 CT. No additional pathologically enlarged mediastinal nodes. No hilar adenopathy. Lungs/Pleura: No pneumothorax. No pleural effusion. Patchy tree-in-bud opacities and nodular 1.5 cm focus of consolidation in the right lower lobe (series 8/image 131), new from 02/02/2022 CT and not appreciably changed from 02/12/2022 CT. Tiny 0.2 cm apical right upper lobe nodule (series 8/image 55), stable since 07/13/2020 CT, considered benign. Musculoskeletal: No aggressive appearing focal osseous lesions. Intact sternotomy wires. Moderate thoracic spondylosis. CTA ABDOMEN AND PELVIS FINDINGS Hepatobiliary: Normal liver with no liver mass. Normal gallbladder with no radiopaque cholelithiasis. No  biliary ductal dilatation. Pancreas: Normal, with no mass or duct dilation. Spleen: Normal size. No mass. Adrenals/Urinary Tract: Normal adrenals. No hydronephrosis. Nonobstructing 11 mm interpolar right and 3 mm interpolar left renal stones. No contour deforming renal masses. Normal bladder. Stomach/Bowel: Normal non-distended stomach. Normal caliber small bowel with no small bowel wall thickening. Appendectomy. Mild scattered colonic diverticulosis with no large bowel wall thickening or significant pericolonic fat stranding. Vascular/Lymphatic: Atherosclerotic nonaneurysmal abdominal aorta. Patent renal, portal and splenic veins. Mildly enlarged 1.3 cm right external iliac lymph node (series 7/image 192) and mildly enlarged 1.0 cm left external iliac lymph node (series 7/image 183), both stable since 02/02/2022 CT. No pathologically enlarged abdominal lymph nodes. Reproductive: Normal size prostate. Other: No pneumoperitoneum, ascites or focal fluid collection. Medial right gluteal subcutaneous hyperdense focus measuring 3.7 cm thickness (series 7/image 178), unchanged since 02/02/2022 CT, new from 01/20/2020 CT, most compatible with a subcutaneous hematoma. Musculoskeletal: No aggressive appearing focal osseous lesions. Moderate lumbar spondylosis. VASCULAR MEASUREMENTS PERTINENT TO TAVR: AORTA: Minimal Aortic Diameter-10.5 x 6.7 mm Severity of Aortic Calcification-moderate-to-severe RIGHT PELVIS: Right Common Iliac Artery - Minimal Diameter-7.0 x 5.2 mm Tortuosity-mild-to-moderate Calcification-severe Right External Iliac Artery - Minimal Diameter-6.6 x 5.1 mm Tortuosity-mild to moderate Calcification-moderate to severe Right Common Femoral Artery - Minimal Diameter-7.3 x 4.7 mm Tortuosity-mild Calcification-moderate to severe LEFT PELVIS: Left Common Iliac Artery - Minimal Diameter-7.3 x 6.2 mm (measured on series 6/image 459 as [employer] had difficulty with the center line at this site) Tortuosity-moderate  Calcification-severe Left External Iliac Artery - Minimal Diameter-6.2 x 6.1 mm Tortuosity-moderate Calcification-moderate Left Common Femoral Artery - Minimal Diameter-7.2 x 6.0 mm Tortuosity-mild Calcification-moderate Review of the MIP images confirms the above findings. IMPRESSION: 1. Vascular findings and measurements pertinent to potential TAVR procedure, as detailed. 2. Diffuse thickening and coarse calcification of the aortic valve, compatible with the reported history of aortic stenosis. 3. Borderline mild cardiomegaly. 4. Patchy tree-in-bud opacities and nodular 1.5 cm focus of consolidation in the right lower lobe, new from 02/02/2022 CT and not appreciably  changed from 02/12/2022 CT, most compatible with a nonspecific mild bronchopneumonia such as due to aspiration. Recommend attention on follow-up chest CT in 3 months. 5. Nonspecific mild mediastinal and bilateral external iliac lymphadenopathy, stable since 02/02/2022 CT. Recommend attention on follow-up CT chest, abdomen and pelvis with oral and IV contrast in 3 months. 6. Stable subcutaneous hematoma in the medial right gluteal region. 7. Nonobstructing bilateral nephrolithiasis. 8. Mild scattered colonic diverticulosis. 9.  Aortic Atherosclerosis (ICD10-I70.0). Electronically Signed   By: Ilona Sorrel M.D.   On: 02/13/2022 12:14     LOS: 0 days   Antonieta Pert, MD [facility]  02/14/2022, 8:57 AM

## 2022-02-15 ENCOUNTER — Observation Stay (HOSPITAL_COMMUNITY): Payer: Medicare Other

## 2022-02-15 DIAGNOSIS — G9340 Encephalopathy, unspecified: Secondary | ICD-10-CM | POA: Diagnosis not present

## 2022-02-15 DIAGNOSIS — I616 Nontraumatic intracerebral hemorrhage, multiple localized: Secondary | ICD-10-CM | POA: Diagnosis not present

## 2022-02-15 DIAGNOSIS — R4182 Altered mental status, unspecified: Secondary | ICD-10-CM | POA: Diagnosis not present

## 2022-02-15 DIAGNOSIS — G936 Cerebral edema: Secondary | ICD-10-CM | POA: Diagnosis not present

## 2022-02-15 DIAGNOSIS — S066X0A Traumatic subarachnoid hemorrhage without loss of consciousness, initial encounter: Secondary | ICD-10-CM | POA: Diagnosis not present

## 2022-02-15 DIAGNOSIS — J9601 Acute respiratory failure with hypoxia: Secondary | ICD-10-CM | POA: Diagnosis not present

## 2022-02-15 DIAGNOSIS — R0602 Shortness of breath: Secondary | ICD-10-CM | POA: Diagnosis not present

## 2022-02-15 DIAGNOSIS — J9602 Acute respiratory failure with hypercapnia: Secondary | ICD-10-CM | POA: Diagnosis not present

## 2022-02-15 DIAGNOSIS — G9341 Metabolic encephalopathy: Secondary | ICD-10-CM

## 2022-02-15 DIAGNOSIS — R55 Syncope and collapse: Secondary | ICD-10-CM

## 2022-02-15 DIAGNOSIS — S066XAA Traumatic subarachnoid hemorrhage with loss of consciousness status unknown, initial encounter: Secondary | ICD-10-CM | POA: Diagnosis not present

## 2022-02-15 LAB — URINALYSIS, ROUTINE W REFLEX MICROSCOPIC
Bilirubin Urine: NEGATIVE
Glucose, UA: NEGATIVE mg/dL
Hgb urine dipstick: NEGATIVE
Ketones, ur: NEGATIVE mg/dL
Leukocytes,Ua: NEGATIVE
Nitrite: NEGATIVE
Protein, ur: NEGATIVE mg/dL
Specific Gravity, Urine: 1.008 (ref 1.005–1.030)
pH: 6 (ref 5.0–8.0)

## 2022-02-15 LAB — PROCALCITONIN: Procalcitonin: <0.1 ng/mL

## 2022-02-15 LAB — BLOOD GAS, ARTERIAL
Acid-Base Excess: 2.4 mmol/L — ABNORMAL HIGH (ref 0.0–2.0)
Acid-Base Excess: 4.8 mmol/L — ABNORMAL HIGH (ref 0.0–2.0)
Acid-Base Excess: 6.4 mmol/L — ABNORMAL HIGH (ref 0.0–2.0)
Bicarbonate: 30.2 mmol/L — ABNORMAL HIGH (ref 20.0–28.0)
Bicarbonate: 32.4 mmol/L — ABNORMAL HIGH (ref 20.0–28.0)
Bicarbonate: 33.5 mmol/L — ABNORMAL HIGH (ref 20.0–28.0)
Drawn by: 36277
Drawn by: 55062
O2 Saturation: 98.3 %
O2 Saturation: 97.7 %
Patient temperature: 38.2
Patient temperature: 38.2
Patient temperature: 37
pCO2 arterial: 63 mmHg — ABNORMAL HIGH (ref 32–48)
pCO2 arterial: 63 mmHg — ABNORMAL HIGH (ref 32–48)
pCO2 arterial: 58 mmHg — ABNORMAL HIGH (ref 32–48)
pH, Arterial: 7.29 — ABNORMAL LOW (ref 7.35–7.45)
pH, Arterial: 7.32 — ABNORMAL LOW (ref 7.35–7.45)
pH, Arterial: 7.37 (ref 7.35–7.45)
pO2, Arterial: 117 mmHg — ABNORMAL HIGH (ref 83–108)
pO2, Arterial: 98 mmHg (ref 83–108)
pO2, Arterial: 129 mmHg — ABNORMAL HIGH (ref 83–108)

## 2022-02-15 LAB — RESPIRATORY PANEL BY PCR

## 2022-02-15 LAB — HEPATIC FUNCTION PANEL
ALT: 22 U/L (ref 0–44)
AST: 23 U/L (ref 15–41)
Albumin: 3.1 g/dL — ABNORMAL LOW (ref 3.5–5.0)
Alkaline Phosphatase: 61 U/L (ref 38–126)
Bilirubin, Direct: <0.1 mg/dL (ref 0.0–0.2)
Total Bilirubin: 0.2 mg/dL — ABNORMAL LOW (ref 0.3–1.2)
Total Protein: 7.1 g/dL (ref 6.5–8.1)

## 2022-02-15 LAB — BASIC METABOLIC PANEL
Anion gap: 9 (ref 5–15)
BUN: 13 mg/dL (ref 8–23)
CO2: 24 mmol/L (ref 22–32)
Calcium: 8.6 mg/dL — ABNORMAL LOW (ref 8.9–10.3)
Chloride: 100 mmol/L (ref 98–111)
Creatinine, Ser: 0.84 mg/dL (ref 0.61–1.24)
GFR, Estimated: >60 mL/min (ref >60)
Glucose, Bld: 155 mg/dL — ABNORMAL HIGH (ref 70–99)
Potassium: 4.1 mmol/L (ref 3.5–5.1)
Sodium: 133 mmol/L — ABNORMAL LOW (ref 135–145)

## 2022-02-15 LAB — CBC
HCT: 30.1 % — ABNORMAL LOW (ref 39.0–52.0)
Hemoglobin: 8.9 g/dL — ABNORMAL LOW (ref 13.0–17.0)
MCH: 27.4 pg (ref 26.0–34.0)
MCHC: 29.6 g/dL — ABNORMAL LOW (ref 30.0–36.0)
MCV: 92.6 fL (ref 80.0–100.0)
Platelets: 204 10*3/uL (ref 150–400)
RBC: 3.25 MIL/uL — ABNORMAL LOW (ref 4.22–5.81)
RDW: 16.2 % — ABNORMAL HIGH (ref 11.5–15.5)
WBC: 14.1 10*3/uL — ABNORMAL HIGH (ref 4.0–10.5)
nRBC: 0 % (ref 0.0–0.2)

## 2022-02-15 LAB — AMMONIA: Ammonia: 47 umol/L — ABNORMAL HIGH (ref 9–35)

## 2022-02-15 LAB — GLUCOSE, CAPILLARY: Glucose-Capillary: 109 mg/dL — ABNORMAL HIGH (ref 70–99)

## 2022-02-15 MED ORDER — PANTOPRAZOLE SODIUM 40 MG IV SOLR
40.0000 mg | Freq: Two times a day (BID) | INTRAVENOUS | Status: DC
  Administered 2022-02-15 – 2022-02-17 (×4): 40 mg via INTRAVENOUS
  Filled 2022-02-15 (×4): qty 10

## 2022-02-15 MED ORDER — OSELTAMIVIR PHOSPHATE 75 MG PO CAPS
75.0000 mg | ORAL_CAPSULE | Freq: Two times a day (BID) | ORAL | Status: DC
  Administered 2022-02-15 – 2022-02-19 (×9): 75 mg via ORAL
  Filled 2022-02-15 (×11): qty 1

## 2022-02-15 MED ORDER — ACETAMINOPHEN 10 MG/ML IV SOLN
650.0000 mg | Freq: Once | INTRAVENOUS | Status: AC
  Administered 2022-02-15: 650 mg via INTRAVENOUS
  Filled 2022-02-15: qty 65

## 2022-02-15 MED ORDER — INSULIN ASPART 100 UNIT/ML IJ SOLN
0.0000 [IU] | Freq: Four times a day (QID) | INTRAMUSCULAR | Status: DC
  Administered 2022-02-18: 1 [IU] via SUBCUTANEOUS

## 2022-02-15 MED ORDER — LACTULOSE 10 GM/15ML PO SOLN
10.0000 g | Freq: Every day | ORAL | Status: DC
  Administered 2022-02-15 – 2022-02-17 (×3): 10 g via ORAL
  Filled 2022-02-15 (×3): qty 15

## 2022-02-15 NOTE — Plan of Care (Signed)
Patient placed on NIV at 0945 this morning due to AMS. ABG drawn and results as charted. No changes at this time.

## 2022-02-15 NOTE — Progress Notes (Signed)
Patient with increasing somnolence.  No new focal deficits.  He will awaken with great effort and will follow commands bilaterally.  Follow-up head CT scan today demonstrates stable appearance of a right frontal contusion.  There is no evidence of worsening of this contusion.  There is no new evidence of obvious new stroke or other significant structural problem.  Patient with significant traumatic brain injury and right frontal contusion which is cold last over the last couple days.  He should be fine to restart anticoagulation in 5 days.  Continue supportive efforts.  I think his decline in mental status is multifactorial and not completely related to his brain injury.

## 2022-02-15 NOTE — Progress Notes (Signed)
TR[facility] night cross cover note:   I was notified by RN that patient exhibited some evidence of non-sensical speech during this shift, representing a change, without any associated  acute focal weakness, acute focal numbness, paresthesias, facial droop, acute change in vision, dysphagia, vertigo.   Per my chart review, this is 22 male admitted with SAH, with most recent CT head on 12/2 reported to show some interval worsening of SAH, with neurosurgery aware of that change and requesting updated CT head on 12/4.   Current VS appear reassuring, including no e/o worsening hypertension or bradycardia to suggest worsening of intracranial pressures. However, given the above history, will check stat ct to eval for any contribution from changing nature of sah.      Newton Pigg, DO Hospitalist

## 2022-02-15 NOTE — Progress Notes (Signed)
PROGRESS NOTE Jose [name]  WGN:[id number] DOB: [birth year] DOA: 02/12/2022 PCP: [name], MD   Brief Narrative/Hospital Course: 66 y.o. 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   [facility] 11/20-11/24  s/p fall/syncopal episode at home acutely altered requiring intubation and admitted into the ICU, also treated for MRSA aspiration pneumonia.  Possible seizure though not captured on EEG.Dr. Amada Jupiter note 11/24 very thoughtfully explains patient's prodrome before falling which is less consistent with seizure more likely syncope of other etiology, his posterior circulation stenosis not felt likely to cause to vertebral artery insufficiency. >Presented to [facility] ED 11/30 secondary to fall at home leaving a dent on refrigerator.   In ED: afebrile with pulse 74-107, respirations 13-25, blood pressure 102/45-147/71, and O2 saturating well on home oxygen 4 L  CT scan of the head, cervical spine, chest, abdomen, pelvis, and lumbar spine DONE: CT scan of the head noted small area of subarachnoid hemorrhage at the superior medial right convexity.  Labs were noted to be stable.  Neurosurgery have been consulted and recommended serial CT exams. The next CT scan of the brain noted slight interval increase in size of the small subarachnoid hemorrhage without mass effect or midline shift.  Cardiology was consulted for  syncope. Patient admitted for further management. Initially felt AS as the etiology for syncope so had cardiac cath 12/1:Widely patent native vessels on the left system, no significant stenosis was identified however has been grafted. Only lesion that was felt to be significant was distal circumflex which is occluded. Right coronary artery has a prior stent that is occluded, very large RCA which is now bypassed and graft retrogradely fills the native RCA all the way back to the stent at the ostium.     Subjective: Seen this am urgently He has confusion this am- repeating words ,non sensical moving all his extremities well no focal weakness PERRLA Low grade temp,labs showed bump in wbc We checked labs> including blood culture respiratory virus panel procalcitonin. Calls were urgently made to his wife who did not pick up, then called to the daughter Neurosurgeon urgently informed   Assessment and Plan:  Small traumatic SAH Right frontal contusion: Patient with traumatic right frontal lobe hemorrhage, subarachnoid hemorrhage neurosurgery following.  CT head obtained this morning urgently> bleeding is unchanged neurosurgery following.  Advised to hold Coumadin x5 days. He will follow-up with [facility] as outpatient.    Recurrent fall episodes of unclear etiology Cardiogenic syncope versus combination of deconditioning and gait instability Moderate aortic stenosis Asymptomatic B/L carotid artery stenosis: Diligently reviewed by Dr. Jacinto Halim all his previous work-up including at [facility], several scans echocardiogram work-up and he feels aortic stenosis is moderate at most and TAVR will not be effective in preventing his recurrent fall, episodes of fall where he is completely conscious, he feels weak in his legs and gets down to the ground and sometimes he also states that he falls he does not remember how he fell.  VBI ruled out  on recent admission-bilateral antegrade vertebral artery  flow.  CAD appears stable.  May have a component of cardiogenic syncope related to conduction system disease versus deconditioning in the setting of his complex medical comorbidities/obesity/chronic hypoxia leg edema.  Cardiology planning for loop recorder on Monday.  Will need surveillance carotid artery duplex.  Of note he had a CABG following which for almost 2 years he was bedbound and nearly bedbound.  Continue PT OT  Acute metabolic encephalopathy: Suspect multifactorial with hypercarbia metabolic acidosis and also influenza positive.  Repeat CT head overall stable bleed, ABG ordered stat, placed on BiPAP.  Ammonia slightly elevated ordered lactulose, check LFTs.Monitor neurochecks. Repeat ABG.  Monitor closely in progressive bed> low threshold for ICU transfer  Febrile episode Influenza A positive: Fever up to 101.3 today.Overnight bump in WBC count, respiratory panel is positive for influenza: Start Tamiflu, droplet precaution.  Procalcitonin less than 0.1 reassuring, blood culture has been sent, chest x-ray unremarkable  COPD with chronic respiratory failure-on 4 to 6 L Joshua Morbid obesity with OHS-on home trilogy Unclear how much he is compliant with trilogy- but daughter states he has been good with the use of his Trilogy at home.  Patient refused BiPAP here but 12/3 morning with worsening hypercarbia and mental status placed on BiPAP.  Continue BiPAP bedtime/as needed PCCM has been consulted and appreciate input. Continue inhalers-breo-incruse/nebulizer and monitor respiratory status.  Ant 7th rib fracture on CT chest: Continue pain control  CAD, CABG x3: No chest pain.  Continue statin metoprolol.asa/coumadin on hold. Essential hypertension:BP stable/soft on metoprolol. HLD: Cont statin Asymptomatic bilateral carotid artery stenosis:carotid duplex reviewed from 12/06/2020, 50 to 69% stenosis right and left <15 to 49% stenosis with <50% stenosis of the left external carotid artery. B/L  antegrade vertebra   Chronic diastolic CHF Chronic hyperpigmented hyperkeratotic lower extremities: He is on torsemide,continue same.  Check proBNP Net IO Since Admission: -2,110.33 mL [02/15/22 1504]  Filed Weights   02/12/22 0846  Weight: 107 kg    Recent aspiration pneumonia: Was recently hospitalized at [facility]> completed Zyvox 12/1.  Chest x-ray 12/3 is clear  Normocytic anemia: Hb b/l: 9 to 10 g.  Remained stable.  Recently as low as low 7 needing transfusion in mid November Recent Labs  Lab 02/13/22 1446 02/13/22 1447 02/13/22 1455 02/14/22 0110 02/15/22 0805  HGB 8.8* 8.8* 8.8* 7.8* 8.9*  HCT 26.0* 26.0* 26.0* 26.8* 30.1*   BPH: ON Flomax  On Cymbalta/Neurontin Topamax  History of PE in 2011 History of antiphospholipid antibody syndrome on chronic Coumadin: Goal as low as 2.0 due to recurrent falls- was seen by Dr Ignacia Marvel on 01/23/22 at [facility]- there is a concern about his antiphospholipid Dx> repeat blood work was done and possible consideration to transition to DOAC due to low risk Dx>  plan was to follow up outpatient with him w/. APS labs- came back at Anticardiolipin IgG 26 ( normal 0-14), Anticardiolopin IgM normal, hexagonal phase phospholipid up 46 ( 0-11 normal), dRVVT Mix 62 ( nl 0-40.4s), dDRVT Confirm up 2.0 ( nl 0.8-1.2), beta2 glycoprotein igG,I gM normal, lupus anticoagulant consistent with lupus anticoagulant.CTA PE 11/30: neg for PE.  Patient reported that he is not planning to switch to other blood thinner, and is also not planning to follow-up with hematology.  I have presented with him that he has very limited option care he will need anticoagulation at the same time at risk of traumatic bleeding given his recurrent fall.  Neurosurgery advised to hold anticoagulation  Coumadin x5 days  Recent Labs  Lab 02/12/22 0035 02/12/22 2049 02/13/22 0710 02/14/22 1839  INR 2.2* 2.2* 1.8* 1.3*    T2DM: Poorly controlled blood sugar in 200, last A1c normal 5.6 in  02/02/2022.  Diet controlled, resume diet once safe from respiratory status.  Add ssi for now.Holding OHA. No results for input(s): "GLUCAP", "HGBA1C" in the last 168 hours.   Class II Obesity:Patient's Body mass index is 36.95 kg/m. : Will benefit with PCP follow-up, weight loss  healthy lifestyle and outpatient sleep evaluation.  Goals of care: Currently full code.  Patient has multiple complex severe comorbidities> overall prognosis does not appear bright.  He is in difficult condition due to his need for anticoagulation with Coumadin and at the same time having multiple syncopal episodes leading to recurrent admissions Palliative care consulted to discuss CODE STATUS.  02/15/22:Called his wife again no answer and VM is full- then called and spoke spoke to daughter>she sates his memory has ben not been the same since then has been confused on and off. Hse is aware of what is going on and understands his medical condition  DVT prophylaxis: Place and maintain sequential compression device Start: 02/12/22 1734 SCDs Start: 02/12/22 1112 Code Status:   Code Status: Full Code Family Communication: plan of care discussed with patient at bedside.  Updated daughter.  Patient status is: Admitted as observation, remains hospitalized  due to SAH/recurrent syncope and altered mental status.    Level of care: Progressive  Dispo: The patient is from: home            Anticipated disposition: TBD.PT OT evaluation as tolerated Objective: Vitals last 24 hrs: Vitals:   02/15/22 0949 02/15/22 1109 02/15/22 1153 02/15/22 1224  BP: (!) 141/70 (!) 98/53 101/60   Pulse: 99 96 91 93  Resp: (!) 28 (!) 28 (!) 27   Temp:    (!) 101.3 F (38.5 C)  TempSrc:    Oral  SpO2: 97% 94% 96% 98%  Weight:      Height:       Weight change:   Physical Examination: General exam: Somnolent confused oriented to self current place repeatedly with answers HEENT:Oral mucosa moist, Ear/Nose WNL grossly, dentition  normal. Respiratory system: bilaterally diminished BS, tachypneic and in respiratory distress Cardiovascular system: S1 & S2 +, regular rate, JVD neg. Gastrointestinal system: Abdomen soft, obese, NT,ND,BS+ Nervous System: Somnolent however able to move all his extremities with good strength  Extremities: LE ankle edema -chronic hyperpigmentation and skin changes in thickened skin, lower extremities warm Skin: No rashes,no icterus. MSK: Normal muscle bulk,tone, power   Medications reviewed:  Scheduled Meds:  DULoxetine  90 mg Oral Daily   fluticasone furoate-vilanterol  1 puff Inhalation Daily   And   umeclidinium bromide  1 puff Inhalation Daily   gabapentin  600 mg Oral BID   lactulose  10 g Oral Daily   metoprolol tartrate  12.5 mg Oral BID   oseltamivir  75 mg Oral BID   pantoprazole (PROTONIX) IV  40 mg Intravenous Q12H   rosuvastatin  10 mg Oral QPM   sodium chloride flush  3 mL Intravenous Q12H   sodium chloride flush  3 mL Intravenous Q12H   tamsulosin  0.4 mg Oral QHS   topiramate  75 mg Oral TID   torsemide  20 mg Oral Daily   Continuous Infusions:  sodium chloride      Diet Order  Diet NPO time specified  Diet effective now                  Intake/Output Summary (Last 24 hours) at 02/15/2022 1452 Last data filed at 02/15/2022 1100 Gross per 24 hour  Intake 240 ml  Output 1950 ml  Net -1710 ml   Net IO Since Admission: -2,110.33 mL [02/15/22 1452]  Wt Readings from Last 3 Encounters:  02/12/22 107 kg  02/05/22 107.8 kg  01/22/22 111.7 kg     Unresulted Labs (From admission, onward)     Start     Ordered   02/16/22 0500  Procalcitonin  Daily at 5am,   R      02/15/22 0943   02/16/22 0500  CBC  Daily at 5am,   R      02/15/22 0957   02/16/22 0500  Comprehensive metabolic panel  Daily at 5am,   R      02/15/22 0957   02/15/22 0943  Culture, blood (Routine X 2) w Reflex to ID Panel  BLOOD CULTURE X 2,   R (with TIMED occurrences)       02/15/22 0943   02/15/22 0904  Urinalysis, Routine w reflex microscopic  ONCE - URGENT,   URGENT        02/15/22 0903          Data Reviewed: I have personally reviewed following labs and imaging studies CBC: Recent Labs  Lab 02/12/22 0035 02/12/22 0051 02/12/22 2049 02/13/22 0148 02/13/22 1446 02/13/22 1447 02/13/22 1455 02/14/22 0110 02/15/22 0805  WBC 9.3  --  8.9 9.7  --   --   --  9.0 14.1*  HGB 9.2*   < > 9.2* 9.2* 8.8* 8.8* 8.8* 7.8* 8.9*  HCT 31.3*   < > 31.1* 32.1* 26.0* 26.0* 26.0* 26.8* 30.1*  MCV 94.6  --  93.4 96.4  --   --   --  96.4 92.6  PLT 308  --  297 253  --   --   --  234 204   < > = values in this interval not displayed.   Basic Metabolic Panel: Recent Labs  Lab 02/12/22 0035 02/12/22 0051 02/13/22 0148 02/13/22 1446 02/13/22 1447 02/13/22 1455 02/14/22 0110 02/15/22 0805  NA 140 139 139 142 142 142 140 133*  K 4.1 4.1 4.5 4.0 4.0 4.0 3.9 4.1  CL 100 104 104  --   --   --  107 100  CO2 27  --  27  --   --   --  26 24  GLUCOSE 94 89 137*  --   --   --  130* 155*  BUN 14 15 10   --   --   --  11 13  CREATININE 0.83 0.90 0.92  --   --   --  0.95 0.84  CALCIUM 9.0  --  8.6*  --   --   --  8.2* 8.6*   GFR: Estimated Creatinine Clearance: 100.9 mL/min (by C-G formula based on SCr of 0.84 mg/dL). Liver Function Tests: Recent Labs  Lab 02/12/22 0035  AST 20  ALT 27  ALKPHOS 66  BILITOT 0.3  PROT 7.1  ALBUMIN 3.3*   No results for input(s): "LIPASE", "AMYLASE" in the last 168 hours. Recent Labs  Lab 02/15/22 1133  AMMONIA 47*   Coagulation Profile: Recent Labs  Lab 02/12/22 0035 02/12/22 2049 02/13/22 0710 02/14/22 1839  INR 2.2* 2.2* 1.8* 1.3*   Recent  Results (from the past 240 hour(s))  Respiratory (~20 pathogens) panel by PCR     Status: Abnormal   Collection Time: 02/15/22 11:14 AM   Specimen: Nasopharyngeal Swab; Respiratory  Result Value Ref Range Status   Adenovirus NOT DETECTED NOT DETECTED Final   Coronavirus 229E  NOT DETECTED NOT DETECTED Final    Comment: (NOTE) The Coronavirus on the Respiratory Panel, DOES NOT test for the novel  Coronavirus (2019 nCoV)    Coronavirus HKU1 NOT DETECTED NOT DETECTED Final   Coronavirus NL63 NOT DETECTED NOT DETECTED Final   Coronavirus OC43 NOT DETECTED NOT DETECTED Final   Metapneumovirus NOT DETECTED NOT DETECTED Final   Rhinovirus / Enterovirus NOT DETECTED NOT DETECTED Final   Influenza A H1 2009 DETECTED (A) NOT DETECTED Final   Influenza B NOT DETECTED NOT DETECTED Final   Parainfluenza Virus 1 NOT DETECTED NOT DETECTED Final   Parainfluenza Virus 2 NOT DETECTED NOT DETECTED Final   Parainfluenza Virus 3 NOT DETECTED NOT DETECTED Final   Parainfluenza Virus 4 NOT DETECTED NOT DETECTED Final   Respiratory Syncytial Virus NOT DETECTED NOT DETECTED Final   Bordetella pertussis NOT DETECTED NOT DETECTED Final   Bordetella Parapertussis NOT DETECTED NOT DETECTED Final   Chlamydophila pneumoniae NOT DETECTED NOT DETECTED Final   Mycoplasma pneumoniae NOT DETECTED NOT DETECTED Final    Comment: Performed at [facility], [address], [place], [place] [address]    Antimicrobials: Anti-infectives (From admission, onward)    Start     Dose/Rate Route Frequency Ordered Stop   02/15/22 1430  oseltamivir (TAMIFLU) capsule 75 mg        75 mg Oral 2 times daily 02/15/22 1340 02/20/22 0959   02/12/22 2200  linezolid (ZYVOX) tablet 600 mg  Status:  Discontinued        600 mg Oral 2 times daily 02/12/22 1728 02/12/22 1739   02/12/22 2200  linezolid (ZYVOX) tablet 600 mg        600 mg Oral 2 times daily 02/12/22 1739 02/13/22 0912      Culture/Microbiology    Component Value Date/Time   SDES TRACHEAL ASPIRATE 02/03/2022 0727   SPECREQUEST NONE 02/03/2022 0727   CULT  02/03/2022 0727    ABUNDANT METHICILLIN RESISTANT STAPHYLOCOCCUS AUREUS   REPTSTATUS 02/05/2022 FINAL 02/03/2022 0727   Radiology Studies: [facility] Chest Port 1 View  Result Date:  02/15/2022 CLINICAL DATA:  Shortness of breath EXAM: PORTABLE CHEST 1 VIEW COMPARISON:  February 12, 2022 FINDINGS: The heart size and mediastinal contours are stable. The heart size is enlarged. Prior median sternotomy and CABG. Both lungs are clear. The visualized skeletal structures are unremarkable. IMPRESSION: No active disease. Electronically Signed   By: Sherian Rein M.D.   On: 02/15/2022 10:02   CT HEAD WO CONTRAST ( )  Result Date: 02/15/2022 CLINICAL DATA:  Change in mental status EXAM: CT HEAD WITHOUT CONTRAST TECHNIQUE: Contiguous axial images were obtained from the base of the skull through the vertex without intravenous contrast. RADIATION DOSE REDUCTION: This exam was performed according to the departmental dose-optimization program which includes automated exposure control, adjustment of the mA and/or kV according to patient size and/or use of iterative reconstruction technique. COMPARISON:  Yesterday FINDINGS: Brain: High and anterior right frontal hematoma with adjacent edema and small volume subarachnoid hemorrhage, parenchymal component measuring 17 mm. No interval progression. No gray matter infarct, hydrocephalus, or shift. Vascular: No hyperdense vessel or unexpected calcification. Skull: Normal. Negative for fracture or focal lesion. Sinuses/Orbits:  No acute finding. IMPRESSION: Unchanged right frontal lobe hemorrhage with local subarachnoid spread and edema. No new or progressive finding. Electronically Signed   By: Tiburcio Pea M.D.   On: 02/15/2022 09:34   CT HEAD WO CONTRAST ( )  Result Date: 02/14/2022 CLINICAL DATA:  Subarachnoid hemorrhage follow-up. EXAM: CT HEAD WITHOUT CONTRAST TECHNIQUE: Contiguous axial images were obtained from the base of the skull through the vertex without intravenous contrast. RADIATION DOSE REDUCTION: This exam was performed according to the departmental dose-optimization program which includes automated exposure control, adjustment of the mA  and/or kV according to patient size and/or use of iterative reconstruction technique. COMPARISON:  CT head without contrast 02/12/2022 FINDINGS: Brain: Previous area of hemorrhagic contusion in the anterior right frontal lobe has expanded. The parenchymal hemorrhage now measures 2.3 x 1.7 x 1.4 cm with increased surrounding edema. Subarachnoid and subdural hemorrhage along the falx is similar the prior exam. No distal hemorrhage is present. Mild atrophy and white matter changes are stable. The ventricles are of normal size. The brainstem and cerebellum are within normal limits. Vascular: Atherosclerotic calcifications are present within the cavernous internal carotid arteries bilaterally. No hyperdense vessel is present. Skull: Calvarium is intact. No focal lytic or blastic lesions are present. No significant extracranial soft tissue lesion is present. Sinuses/Orbits: The paranasal sinuses and mastoid air cells are clear. The globes and orbits are within normal limits. IMPRESSION: 1. Previous area of hemorrhagic contusion in the anterior right frontal lobe has expanded. The parenchymal hemorrhage now measures 2.3 x 1.7 x 1.4 cm with increased surrounding edema. 2. Subarachnoid and subdural hemorrhage along the falx is similar the prior exam. 3. Stable atrophy and white matter disease. Critical Value/emergent results were called by telephone at the time of interpretation on 02/14/2022 at 5:44 pm to provider [facility] , who verbally acknowledged these results. Electronically Signed   By: Marin Roberts M.D.   On: 02/14/2022 17:44     LOS: 0 days   Lanae Boast, MD [facility]  02/15/2022, 2:52 PM

## 2022-02-15 NOTE — Consult Note (Signed)
NAME:  Jose [name], MRN:  [id number], DOB:  1955-04-30, LOS: 0 ADMISSION DATE:  02/12/2022, CONSULTATION DATE:  02/15/22 REFERRING MD:  Lupita Leash  -TRH, CHIEF COMPLAINT:  Ams   History of Present Illness:  66 yo M PMH COPD w home trilogy noncompliance and baseline 4-6L [place] req, Antiphospholipid syndrome, PE, chronic coumadin, posterior circ stenosis, aortic stenosis, syncope of unclear etiology who has had several recent hospitalizations r/t syncope and/or bleeding. Last hospitalization (11/20-11/24) he was intubated in field for unresponsiveness and ?cardiac arrest though unclear. Neuro was involved for possibe sz though there were none captured on EEG. Dr. Cecil Cobbs note 11/24 very thoughtfully explains the pts prodrome before falling, which was less c/w sz and more likely syncope of other etiology. Additionally, noted that his posterior circ stenosis is not felt likely to cause true vertebrobasilar artery insufficiency   He was admitted again 11/30 following a fall at home. He had a CT H which revealed a small SAH for which NSGY was consulted, there ws not a surgical indication. Repeat CT H with slight incr in size of SAH, without mass effect or midline shift.  Coumadin has been held.  Cards following as well given syncope -- had cath 12/1 which revealed occluded RCA stent. Has moderate AS, and it was felt that TAVR would not be helpful in preventing falls.    12/3 more somnolent. PCCM consulted with question of airway protection with need for CT H.  Nb-- On chart review, discussion w Rns, RT, MD -- pt has not been wearing BiPAP this admission at night.   Pertinent  Medical History  COPD Chronic hypoxia and hypercarbia AS CAD APL PE Chronic anticoagulation   Significant Hospital Events: Including procedures, antibiotic start and stop dates in addition to other pertinent events   11/30 admit after fall, small tSAH. No NSGY surgical need. 12/1 cardiac cath 12/2 no sx with 230m walk other  than SOB w cards. TAVR not felt to have great efficacy in preventing syncopal events and falls  12/3 more altered. PCCM consulted   Interim History / Subjective:  Consulted   Didn't wear bipap last night  BMP Abg pending  Wbc up to 14 and temp 100.8 Lost IV access   Objective   Blood pressure (!) 159/74, pulse 89, temperature 98.2 F (36.8 C), temperature source Oral, resp. rate 20, height 5\' 7"  (1.702 m), weight 107 kg, SpO2 95 %.        Intake/Output Summary (Last 24 hours) at 02/15/2022 0835 Last data filed at 02/14/2022 2342 Gross per 24 hour  Intake 340 ml  Output 1950 ml  Net -1610 ml   Filed Weights   02/12/22 0846  Weight: 107 kg    Examination: General: chronically ill M NAD  HENT: NCAT. [place] in place Lungs: Diminished basilar sounds, no accessory muscle use Cardiovascular: rr cap refill about 2 seconds  Abdomen: soft protuberant nontender Extremities: BLE edema. No acute joint deformity Neuro: Oriented x2. Pupils are 21mm brisk. Agitation is confounding consistent commands, but does not have focal neuro deficit. CAM ICU+ GU: defer  Resolved Hospital Problem list     Assessment & Plan:   Acute encephalopathy, likely metabolic COPD, chronic respiratory failure with hypoxia and hypercarbia  Frequent falls Recurrent syncope unclear etiology Posterior circ stenosis  APL Aortic stenosis CAD  Leukocytosis -very complex constellation of sx. Falls/syncope etiology historically not felt to be neurogenic In etiology, this admission seems like cardiogenic syncope is also not very likely to be  primary cause. -PCCM is consulted 12/3 for AMS and resp sx : # RE AMS: has not been wearing BiPAP, wonder if more hypercarbic. Ddx also infection -- has had WBC bump to 14 with temp incr to 100.8, in context of fq hospitalizations, recent MV-- certainly at risk for HAI. Of course AMS 2/2 change in [facility] must be considered, though has no focal def on exam. Consider also Delirium r/t  recurrent hx stays (ICU delirium), hyperammonemia. Seems like this has gradually been worsening this admission, post itcal state seems unlikely + no sz throughout recent admission and has been maintained on home gaba and topamax  # RE Resp status. Baseline 4-6L and has home trilogy (unknown compliance). Does not have acutely incr O2 req. Might be hypoventilating, has not been wearing BiPAP this admission at night. As above, would be at risk for PNA P -he is protecting his airway safely for CT H -- needs STAT CT H to r/o change in [facility] or other acute intracranial process -ABG pending, as is BMP -based on hx, think that we should put on BiPAP and recheck a gas after an hour  -needs to wear BiPAP at night  -CXR, would send bcx, ua, RVP and think a low threshold to start empiric abx  -sending PCT and Ammonia -cont to hold coumadin -appreciate cards and NSGY recs -delirium precautions  - at this time do not think he needs to transfer to ICU   Best Practice (right click and "Reselect all SmartList Selections" daily)   Per primary   Labs   CBC: Recent Labs  Lab 02/12/22 0035 02/12/22 0051 02/12/22 2049 02/13/22 0148 02/13/22 1446 02/13/22 1447 02/13/22 1455 02/14/22 0110 02/15/22 0805  WBC 9.3  --  8.9 9.7  --   --   --  9.0 14.1*  HGB 9.2*   < > 9.2* 9.2* 8.8* 8.8* 8.8* 7.8* 8.9*  HCT 31.3*   < > 31.1* 32.1* 26.0* 26.0* 26.0* 26.8* 30.1*  MCV 94.6  --  93.4 96.4  --   --   --  96.4 92.6  PLT 308  --  297 253  --   --   --  234 204   < > = values in this interval not displayed.    Basic Metabolic Panel: Recent Labs  Lab 02/12/22 0035 02/12/22 0051 02/13/22 0148 02/13/22 1446 02/13/22 1447 02/13/22 1455 02/14/22 0110  NA 140 139 139 142 142 142 140  K 4.1 4.1 4.5 4.0 4.0 4.0 3.9  CL 100 104 104  --   --   --  107  CO2 27  --  27  --   --   --  26  GLUCOSE 94 89 137*  --   --   --  130*  BUN 14 15 10   --   --   --  11  CREATININE 0.83 0.90 0.92  --   --   --  0.95   CALCIUM 9.0  --  8.6*  --   --   --  8.2*   GFR: Estimated Creatinine Clearance: 89.3 mL/min (by C-G formula based on SCr of 0.95 mg/dL). Recent Labs  Lab 02/12/22 2049 02/13/22 0148 02/14/22 0110 02/15/22 0805  WBC 8.9 9.7 9.0 14.1*    Liver Function Tests: Recent Labs  Lab 02/12/22 0035  AST 20  ALT 27  ALKPHOS 66  BILITOT 0.3  PROT 7.1  ALBUMIN 3.3*   No results for input(s): "LIPASE", "AMYLASE" in the  last 168 hours. No results for input(s): "AMMONIA" in the last 168 hours.  ABG    Component Value Date/Time   PHART 7.264 (L) 02/13/2022 1455   PCO2ART 65.3 (HH) 02/13/2022 1455   PO2ART 120 (H) 02/13/2022 1455   HCO3 29.6 (H) 02/13/2022 1455   TCO2 32 02/13/2022 1455   O2SAT 98 02/13/2022 1455     Coagulation Profile: Recent Labs  Lab 02/12/22 0035 02/12/22 2049 02/13/22 0710 02/14/22 1839  INR 2.2* 2.2* 1.8* 1.3*    Cardiac Enzymes: No results for input(s): "CKTOTAL", "CKMB", "CKMBINDEX", "TROPONINI" in the last 168 hours.  HbA1C: Hgb A1c MFr Bld  Date/Time Value Ref Range Status  02/02/2022 08:15 AM 5.6 4.8 - 5.6 % Final    Comment:    (NOTE) Pre diabetes:          5.7%-6.4%  Diabetes:              >6.4%  Glycemic control for   <7.0% adults with diabetes   11/06/2021 12:01 AM 6.5 (H) 4.8 - 5.6 % Final    Comment:    (NOTE) Pre diabetes:          5.7%-6.4%  Diabetes:              >6.4%  Glycemic control for   <7.0% adults with diabetes     CBG: No results for input(s): "GLUCAP" in the last 168 hours.  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   Surgical History:   Past Surgical History:  Procedure Laterality Date   APPENDECTOMY     APPLICATION OF WOUND VAC Left 03/18/2019   Procedure: Application Of Wound Vac;  Surgeon: Nadara Mustard, MD;  Location: [facility] OR;  Service: Orthopedics;  Laterality: Left;   BACK SURGERY     BACK SURGERY  '89, '06   COLONOSCOPY     CORONARY ANGIOPLASTY WITH STENT PLACEMENT     CORONARY ANGIOPLASTY WITH STENT PLACEMENT  '98, '04, Feb 2017   I & D EXTREMITY Left 03/15/2019   Procedure: DEBRIDEMENT LEFT THIGH;  Surgeon: Nadara Mustard, MD;  Location: [facility] OR;  Service: Orthopedics;  Laterality: Left;   INCISION AND DRAINAGE OF WOUND Left 03/18/2019   Procedure: LEFT THIGH DEBRIDEMENT;  Surgeon: Nadara Mustard, MD;  Location: [facility];  Service: Orthopedics;  Laterality: Left;     Social History:   reports that he quit smoking about 3 years ago. His smoking use included cigarettes. He has a 20.00 pack-year smoking history. He has never used smokeless tobacco. He reports that he does not drink alcohol and does not use drugs.   Family History:  His family history includes CAD (age of onset: 3) in his father.   Allergies Allergies  Allergen Reactions   Melatonin Anxiety   Altace [Ramipril] Cough     Home Medications  Prior to Admission medications   Medication Sig Start Date End Date Taking? Authorizing Provider  acetaminophen (TYLENOL) 500 MG tablet Take 1,000 mg by mouth every 6 (six) hours as  needed for mild pain.   Yes [provider]  albuterol (PROVENTIL) (5 MG/ML) 0.5% nebulizer solution Take 0.5 mLs (2.5 mg total) by nebulization every 6 (six) hours as needed for wheezing or shortness of breath. 12/27/18  Yes Trifan, Carola Rhine, MD  albuterol (VENTOLIN HFA) 108 (90 Base) MCG/ACT  inhaler Inhale 2 puffs into the lungs every 6 (six) hours as needed for wheezing or shortness of breath.   Yes [provider]  aspirin EC 81 MG EC tablet Take 1 tablet (81 mg total) by mouth daily. Swallow whole. 01/23/20  Yes Dagar, Meredith Staggers, MD  DULoxetine (CYMBALTA) 60 MG capsule Take 60 mg by mouth daily. Take one capsule (60 mg) along with 30 mg capsule to equal 90 mg daily   Yes [provider]  DULoxetine HCl 30 MG CSDR Take 30 mg by mouth daily. 11/02/21  Yes [provider]  Fluticasone-Umeclidin-Vilant (TRELEGY ELLIPTA IN) Inhale 1 puff into the lungs daily.   Yes [provider]  gabapentin (NEURONTIN) 600 MG tablet Take 600 mg by mouth 2 (two) times daily. 09/02/21  Yes [provider]  glimepiride (AMARYL) 4 MG tablet Take 4 mg by mouth 2 (two) times daily. 12/04/20  Yes [provider]  ipratropium-albuterol (DUONEB) 0.5-2.5 (3) MG/3ML SOLN Take 3 mLs by nebulization every 4 (four) hours as needed (For shortness of breath or wheezing).   Yes [provider]  linezolid (ZYVOX) 600 MG tablet Take 1 tablet (600 mg total) by mouth 2 (two) times daily. 02/06/22  Yes Wouk, Ailene Rud, MD  metoprolol tartrate (LOPRESSOR) 25 MG tablet Take 1 tab daily for for 4 days then 1 tab every other day for 4 days then stop Patient taking differently: Take 12.5 mg by mouth 2 (two) times daily. 02/06/22  Yes Wouk, Ailene Rud, MD  nitroGLYCERIN (NITROSTAT) 0.4 MG SL tablet Place 0.4 mg under the tongue every 5 (five) minutes as needed for chest pain.  07/07/16  Yes [provider]  OXYGEN Inhale 4-6 L into the lungs daily.   Yes [provider]  pantoprazole (PROTONIX) 40 MG tablet Take 40 mg by mouth 2 (two) times daily.   Yes [provider]  rosuvastatin (CRESTOR) 10 MG tablet Take 10 mg by mouth every evening. 07/07/16  Yes [provider]  tamsulosin (FLOMAX) 0.4 MG CAPS capsule Take 1 capsule (0.4 mg total)  by mouth at bedtime. 02/06/22  Yes Wouk, Ailene Rud, MD  topiramate (TOPAMAX) 50 MG tablet Take 1.5 tablets (75 mg total) by mouth 2 (two) times daily. Patient taking differently: Take 75 mg by mouth 3 (three) times daily. 02/06/22  Yes Wouk, Ailene Rud, MD  torsemide (DEMADEX) 20 MG tablet Take 20 mg by mouth daily.   Yes [provider]  warfarin (COUMADIN) 5 MG tablet Take 5 mg by mouth every evening.   Yes [provider]  enoxaparin (LOVENOX) 120 MG/0.8ML injection Inject 0.74 mLs (111 mg total) into the skin every 12 (twelve) hours for 4 days. Patient not taking: Reported on 02/12/2022 01/26/22 01/30/22  Damita Lack, MD  senna-docusate (SENOKOT-S) 8.6-50 MG tablet Take 1 tablet by mouth 2 (two) times daily as needed for moderate constipation. 01/26/22   Damita Lack, MD     Critical care time:     High MDM   Eliseo Gum MSN, AGACNP-BC [place] for pager  02/15/2022, 9:50 AM

## 2022-02-15 NOTE — Progress Notes (Signed)
RT attempted to ask patient if he is willing to wear BIPAP, patient refused and was unsure of whether he should wear it. RT will continue to monitor and BIPAP will be made available if patient changes his mind.

## 2022-02-16 ENCOUNTER — Encounter (HOSPITAL_COMMUNITY): Payer: Self-pay | Admitting: Cardiology

## 2022-02-16 ENCOUNTER — Observation Stay (HOSPITAL_COMMUNITY): Payer: Medicare Other

## 2022-02-16 DIAGNOSIS — E7800 Pure hypercholesterolemia, unspecified: Secondary | ICD-10-CM | POA: Diagnosis present

## 2022-02-16 DIAGNOSIS — J9621 Acute and chronic respiratory failure with hypoxia: Secondary | ICD-10-CM | POA: Diagnosis not present

## 2022-02-16 DIAGNOSIS — Z66 Do not resuscitate: Secondary | ICD-10-CM | POA: Diagnosis present

## 2022-02-16 DIAGNOSIS — Z951 Presence of aortocoronary bypass graft: Secondary | ICD-10-CM | POA: Diagnosis not present

## 2022-02-16 DIAGNOSIS — S06360A Traumatic hemorrhage of cerebrum, unspecified, without loss of consciousness, initial encounter: Secondary | ICD-10-CM | POA: Diagnosis not present

## 2022-02-16 DIAGNOSIS — Z7901 Long term (current) use of anticoagulants: Secondary | ICD-10-CM | POA: Diagnosis not present

## 2022-02-16 DIAGNOSIS — I2510 Atherosclerotic heart disease of native coronary artery without angina pectoris: Secondary | ICD-10-CM | POA: Diagnosis not present

## 2022-02-16 DIAGNOSIS — I350 Nonrheumatic aortic (valve) stenosis: Secondary | ICD-10-CM | POA: Diagnosis not present

## 2022-02-16 DIAGNOSIS — W1830XA Fall on same level, unspecified, initial encounter: Secondary | ICD-10-CM | POA: Diagnosis present

## 2022-02-16 DIAGNOSIS — R296 Repeated falls: Secondary | ICD-10-CM | POA: Diagnosis present

## 2022-02-16 DIAGNOSIS — I5032 Chronic diastolic (congestive) heart failure: Secondary | ICD-10-CM | POA: Diagnosis present

## 2022-02-16 DIAGNOSIS — I609 Nontraumatic subarachnoid hemorrhage, unspecified: Secondary | ICD-10-CM

## 2022-02-16 DIAGNOSIS — J9601 Acute respiratory failure with hypoxia: Secondary | ICD-10-CM | POA: Diagnosis not present

## 2022-02-16 DIAGNOSIS — J9611 Chronic respiratory failure with hypoxia: Secondary | ICD-10-CM | POA: Diagnosis not present

## 2022-02-16 DIAGNOSIS — W19XXXA Unspecified fall, initial encounter: Secondary | ICD-10-CM | POA: Diagnosis not present

## 2022-02-16 DIAGNOSIS — Y92009 Unspecified place in unspecified non-institutional (private) residence as the place of occurrence of the external cause: Secondary | ICD-10-CM | POA: Diagnosis not present

## 2022-02-16 DIAGNOSIS — D6861 Antiphospholipid syndrome: Secondary | ICD-10-CM | POA: Diagnosis present

## 2022-02-16 DIAGNOSIS — D649 Anemia, unspecified: Secondary | ICD-10-CM | POA: Diagnosis present

## 2022-02-16 DIAGNOSIS — I6523 Occlusion and stenosis of bilateral carotid arteries: Secondary | ICD-10-CM | POA: Diagnosis present

## 2022-02-16 DIAGNOSIS — J9622 Acute and chronic respiratory failure with hypercapnia: Secondary | ICD-10-CM | POA: Diagnosis not present

## 2022-02-16 DIAGNOSIS — Z515 Encounter for palliative care: Secondary | ICD-10-CM

## 2022-02-16 DIAGNOSIS — F17210 Nicotine dependence, cigarettes, uncomplicated: Secondary | ICD-10-CM | POA: Diagnosis present

## 2022-02-16 DIAGNOSIS — Z9981 Dependence on supplemental oxygen: Secondary | ICD-10-CM | POA: Diagnosis not present

## 2022-02-16 DIAGNOSIS — I10 Essential (primary) hypertension: Secondary | ICD-10-CM | POA: Diagnosis not present

## 2022-02-16 DIAGNOSIS — E1169 Type 2 diabetes mellitus with other specified complication: Secondary | ICD-10-CM | POA: Diagnosis present

## 2022-02-16 DIAGNOSIS — I352 Nonrheumatic aortic (valve) stenosis with insufficiency: Secondary | ICD-10-CM | POA: Diagnosis present

## 2022-02-16 DIAGNOSIS — S06340A Traumatic hemorrhage of right cerebrum without loss of consciousness, initial encounter: Secondary | ICD-10-CM | POA: Diagnosis present

## 2022-02-16 DIAGNOSIS — J9602 Acute respiratory failure with hypercapnia: Secondary | ICD-10-CM | POA: Diagnosis not present

## 2022-02-16 DIAGNOSIS — E662 Morbid (severe) obesity with alveolar hypoventilation: Secondary | ICD-10-CM | POA: Diagnosis present

## 2022-02-16 DIAGNOSIS — I700 Atherosclerosis of aorta: Secondary | ICD-10-CM | POA: Diagnosis present

## 2022-02-16 DIAGNOSIS — J449 Chronic obstructive pulmonary disease, unspecified: Secondary | ICD-10-CM | POA: Diagnosis not present

## 2022-02-16 DIAGNOSIS — G9341 Metabolic encephalopathy: Secondary | ICD-10-CM | POA: Diagnosis present

## 2022-02-16 DIAGNOSIS — I110 Hypertensive heart disease with heart failure: Secondary | ICD-10-CM | POA: Diagnosis present

## 2022-02-16 DIAGNOSIS — R55 Syncope and collapse: Secondary | ICD-10-CM | POA: Diagnosis not present

## 2022-02-16 DIAGNOSIS — G9340 Encephalopathy, unspecified: Secondary | ICD-10-CM | POA: Diagnosis not present

## 2022-02-16 DIAGNOSIS — S066XAA Traumatic subarachnoid hemorrhage with loss of consciousness status unknown, initial encounter: Secondary | ICD-10-CM | POA: Diagnosis not present

## 2022-02-16 DIAGNOSIS — S066X0A Traumatic subarachnoid hemorrhage without loss of consciousness, initial encounter: Secondary | ICD-10-CM | POA: Diagnosis not present

## 2022-02-16 DIAGNOSIS — S2232XA Fracture of one rib, left side, initial encounter for closed fracture: Secondary | ICD-10-CM | POA: Diagnosis present

## 2022-02-16 LAB — COMPREHENSIVE METABOLIC PANEL
ALT: 26 U/L (ref 0–44)
AST: 30 U/L (ref 15–41)
Albumin: 3.1 g/dL — ABNORMAL LOW (ref 3.5–5.0)
Alkaline Phosphatase: 69 U/L (ref 38–126)
Anion gap: 8 (ref 5–15)
BUN: 10 mg/dL (ref 8–23)
CO2: 29 mmol/L (ref 22–32)
Calcium: 8.7 mg/dL — ABNORMAL LOW (ref 8.9–10.3)
Chloride: 103 mmol/L (ref 98–111)
Creatinine, Ser: 0.7 mg/dL (ref 0.61–1.24)
GFR, Estimated: >60 mL/min (ref >60)
Glucose, Bld: 136 mg/dL — ABNORMAL HIGH (ref 70–99)
Potassium: 3.9 mmol/L (ref 3.5–5.1)
Sodium: 140 mmol/L (ref 135–145)
Total Bilirubin: 0.5 mg/dL (ref 0.3–1.2)
Total Protein: 7.1 g/dL (ref 6.5–8.1)

## 2022-02-16 LAB — GLUCOSE, CAPILLARY
Glucose-Capillary: 118 mg/dL — ABNORMAL HIGH (ref 70–99)
Glucose-Capillary: 140 mg/dL — ABNORMAL HIGH (ref 70–99)
Glucose-Capillary: 95 mg/dL (ref 70–99)
Glucose-Capillary: 96 mg/dL (ref 70–99)

## 2022-02-16 LAB — CBC
HCT: 29.2 % — ABNORMAL LOW (ref 39.0–52.0)
Hemoglobin: 8.6 g/dL — ABNORMAL LOW (ref 13.0–17.0)
MCH: 27.7 pg (ref 26.0–34.0)
MCHC: 29.5 g/dL — ABNORMAL LOW (ref 30.0–36.0)
MCV: 93.9 fL (ref 80.0–100.0)
Platelets: 179 10*3/uL (ref 150–400)
RBC: 3.11 MIL/uL — ABNORMAL LOW (ref 4.22–5.81)
RDW: 16.3 % — ABNORMAL HIGH (ref 11.5–15.5)
WBC: 8.3 10*3/uL (ref 4.0–10.5)
nRBC: 0 % (ref 0.0–0.2)

## 2022-02-16 LAB — AMMONIA: Ammonia: 64 umol/L — ABNORMAL HIGH (ref 9–35)

## 2022-02-16 LAB — PROCALCITONIN: Procalcitonin: <0.1 ng/mL

## 2022-02-16 LAB — BRAIN NATRIURETIC PEPTIDE: B Natriuretic Peptide: 318.1 pg/mL — ABNORMAL HIGH (ref 0.0–100.0)

## 2022-02-16 NOTE — Progress Notes (Signed)
Ambulatory PROGRESS NOTE Jose [name]  BJY:[id number] DOB: [birth year] DOA: 02/12/2022 PCP: [name], MD   Brief Narrative/Hospital Course: 66 y.o. 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   [facility] 11/20-11/24  s/p fall/syncopal episode at home acutely altered requiring intubation and admitted into the ICU, also treated for MRSA aspiration pneumonia.  Possible seizure though not captured on EEG.Dr. Amada Jupiter note 11/24 very thoughtfully explains patient's prodrome before falling which is less consistent with seizure more likely syncope of other etiology, his posterior circulation stenosis not felt likely to cause to vertebral artery insufficiency. >Presented to [facility] ED 11/30 secondary to fall at home leaving a dent on refrigerator.   In ED: afebrile with pulse 74-107, respirations 13-25, blood pressure 102/45-147/71, and O2 saturating well on home oxygen 4 L  CT scan of the head, cervical spine, chest, abdomen, pelvis, and lumbar spine DONE: CT scan of the head noted small area of subarachnoid hemorrhage at the superior medial right convexity.  Labs were noted to be stable.  Neurosurgery have been consulted and recommended serial CT exams. The next CT scan of the brain noted slight interval increase in size of the small subarachnoid hemorrhage without mass effect or midline shift.  Cardiology was  consulted for syncope. Patient admitted for further management. Initially felt AS as the etiology for syncope so had cardiac cath 02/13/22 Multiple falls of unclear etiology cardiology does not recommend TAVR 12/3 AM worsening mental status febrile leukocytosis ABG with CO2 retention in the setting of noncompliance with BiPAP while placed on BiPAP also tested positive for influenza repeat CT head with stable bleed 12/4: Repeat CT head no progression of frontal bleeding.  Mentation improved.  Subjective: Seen and examined this morning He is again on the bedside recliner-as he prefers to be there Is mentally alert awake and oriented. Overnight patient remained afebrile, came OFF BiPAP during night> to nasal cannula 5 L Repeat CT head this morning has been stable Labs reviewed improved WBC count BMP stable No new complaints  Assessment and Plan:  Small traumatic SAH Right frontal contusion: Patient with traumatic right frontal lobe hemorrhage, subarachnoid hemorrhage neurosurgery following.  CT head 12/3/ 12/4: No progression of frontal bleeding.  Neurosurgery advised to hold Coumadin x5 days. He will [facility]-up with [facility] as [facility].    Recurrent fall episodes of unclear etiology Cardiogenic syncope versus combination of deconditioning and gait instability Moderate aortic stenosis Asymptomatic B/L carotid artery stenosis: Dr. Jacinto Halim from cardiology following, extensive discussed with him-all his previous work-up including at [facility], several scans echocardiogram work-up and he feels aortic stenosis is moderate at most and TAVR will not be effective in preventing his recurrent fall, episodes of fall where he is completely conscious, he feels weak in his legs and gets down to the ground and sometimes he also states that he falls he does not remember how he fell. VBI ruled out on recent admission-bilateral antegrade vertebral artery flow.  CAD appears stable.  May have a  component  of cardiogenic syncope related to conduction system disease versus deconditioning in the setting of his complex medical comorbidities/obesity/chronic hypoxia leg edema.Cardiology planning for loop recorder> likely for [place] on d/c. Will need surveillance carotid artery duplex.Of note he had a CABG following which for almost 2 years he was bedbound and nearly bedbound.  Obtain PT OT evaluation today   Acute metabolic encephalopathy: Suspect multifactorial with hypercarbia metabolic acidosis, influenza.Repeat CT x2 with stable bleed.  Improved on BiPAP> continue nightly/as needed, ammonia is elevated increase lactulose twice daily.  Appreciate PCCM input.  Influenza A :Fever up to 101.3 02/15/22: respiratory panel is positive for influenza: Started Tamiflu 12/3.  Procalcitonin less than 0.1 reassuring, blood culture has been sent pending, chest x-ray no acute finding.  Leukocytosis improving.  COPD with chronic respiratory failure-on 4 to 6 L [place] Morbid obesity with OHS-on home trilogy Unclear how much he is compliant with trilogy.Refused BiPAP 12/3 night> had worsening hypercarbia and mental status placed on BiPAP> now improved seen by pulmonary.  Continue supplemental oxygen daytime.Continue inhalers-breo-incruse/nebulizer and monitor respiratory status.  Ant 7th rib fracture on CT chest: Continue pain control  CAD, CABG x3: No chest pain.  Continue statin metoprolol.asa/coumadin on hold. Essential hypertension:BP stable/soft on metoprolol. HLD: Cont statin Asymptomatic bilateral carotid artery stenosis:carotid duplex reviewed from 12/06/2020, 50 to 69% stenosis right and left <15 to 49% stenosis with <50% stenosis of the left external carotid artery. B/L antegrade vertebra   Chronic diastolic CHF Chronic hyperpigmented hyperkeratotic lower extremities: He is on torsemide> continue,,continue.  Volume status overall stable BNP 318. Net IO Since Admission: -3,980.33 mL [02/16/22 1143]  Filed  Weights   02/12/22 0846  Weight: 107 kg    Recent aspiration pneumonia: Was recently hospitalized at [facility]> completed Zyvox 12/1.  Chest x-ray 12/3 is clear  Normocytic anemia: Hb b/l: 9 to 10 g.  Stable in mid 8s.Recently had PRBC transfusion  Recent Labs  Lab 02/13/22 1447 02/13/22 1455 02/14/22 0110 02/15/22 0805 02/16/22 0136  HGB 8.8* 8.8* 7.8* 8.9* 8.6*  HCT 26.0* 26.0* 26.8* 30.1* 29.2*   BPH: on Flomax  On Cymbalta/Neurontin Topamax  History of PE in 2011 History of antiphospholipid antibody syndrome on chronic Coumadin: Goal as low as 2.0 due to recurrent falls- was seen by Dr Ignacia Marvel on 01/23/22 at [facility]- there is a concern about his antiphospholipid Dx> repeat blood work was done and possible consideration to transition to DOAC due to low risk Dx>  plan was to follow up outpatient with him w/. APS labs- came back at Anticardiolipin IgG 26 ( normal 0-14), Anticardiolopin IgM normal, hexagonal phase phospholipid up 46 ( 0-11 normal), dRVVT Mix 62 ( nl 0-40.4s), dDRVT Confirm up 2.0 ( nl 0.8-1.2), beta2 glycoprotein igG,I gM normal, lupus anticoagulant consistent with lupus anticoagulant.CTA PE 11/30: neg for PE.  Patient reported that he is not planning to switch to other blood thinner, and is also not planning to follow-up with hematology.  I have presented with him that he has very limited option care he will need anticoagulation at the same time at risk of traumatic bleeding given his recurrent fall.  Neurosurgery advised to hold anticoagulation Coumadin x5 days  Recent Labs  Lab 02/12/22 0035 02/12/22 2049 02/13/22 0710 02/14/22 1839  INR 2.2* 2.2* 1.8* 1.3*    T2DM: Poorly controlled blood sugar in 200, last A1c normal 5.6 in 02/02/2022.  Diet controlled, blood sugar stable continue SSI.Holding OHA. Recent Labs  Lab 02/15/22 1756 02/16/22 0008 02/16/22  9147  GLUCAP 109* 140* 118*    Class II Obesity:Patient's Body mass index is 36.95 kg/m. : Will benefit  with PCP follow-up, weight loss  healthy lifestyle and outpatient sleep evaluation.  Goals of care: Currently full code.  Patient has multiple complex severe comorbidities> overall prognosis does not appear bright.  He is in difficult condition due to his need for anticoagulation with Coumadin and at the same time having multiple syncopal episodes leading to recurrent admissions Palliative care consulted to discuss CODE STATUS.  02/15/22:Called his wife again no answer and VM is full- then called and spoke spoke to daughter 12/3 and updated. She sates his memory has ben not been the same since then has been confused on and off.   DVT prophylaxis: Place and maintain sequential compression device Start: 02/12/22 1734 SCDs Start: 02/12/22 1112 Code Status:   Code Status: Full Code Family Communication: plan of care discussed with patient at bedside.  Updated daughter.  Patient status is: inpatient due to acute metabolic encephalopathy intracranial bleeding   Level of care: Progressive  Dispo: The patient is from: home            Anticipated disposition: Anticipate discharge home tomorrow . PT OT evaluation pending today  Objective: Vitals last 24 hrs: Vitals:   02/15/22 1933 02/15/22 2251 02/16/22 0400 02/16/22 0741  BP:  122/63 (!) 137/52 (!) 100/48  Pulse: 85  79 66  Resp: (!) Temp:  100 F (37.8 C) 99.2 F (37.3 C) 97.7 F (36.5 C)  TempSrc:  Oral Oral Oral  SpO2:  100% 98% 100%  Weight:      Height:       Weight change:   Physical Examination: General exam: AAox3, weak,older appearing HEENT:Oral mucosa moist, Ear/Nose WNL grossly, dentition normal. Respiratory system: bilaterally diminished BS, no use of accessory muscle Cardiovascular system: S1 & S2 +, regular rate, JVD neg. Gastrointestinal system: Abdomen soft, obese, NT,BS+ Nervous System:Alert, awake, moving extremities and grossly nonfocal Extremities: LE ankle edema neg but chronic skin changes with  hyperpigmentation and hyperkeratosis, lower extremities warm Skin: No rashes,no icterus. MSK: Normal muscle bulk,tone, power   Medications reviewed:  Scheduled Meds:  DULoxetine  90 mg Oral Daily   fluticasone furoate-vilanterol  1 puff Inhalation Daily   And   umeclidinium bromide  1 puff Inhalation Daily   gabapentin  600 mg Oral BID   insulin aspart  0-6 Units Subcutaneous Q6H   lactulose  10 g Oral Daily   metoprolol tartrate  12.5 mg Oral BID   oseltamivir  75 mg Oral BID   pantoprazole (PROTONIX) IV  40 mg Intravenous Q12H   rosuvastatin  10 mg Oral QPM   sodium chloride flush  3 mL Intravenous Q12H   sodium chloride flush  3 mL Intravenous Q12H   tamsulosin  0.4 mg Oral QHS   topiramate  75 mg Oral TID   torsemide  20 mg Oral Daily   Continuous Infusions:  sodium chloride      Diet Order             Diet Heart Room service appropriate? Yes; Fluid consistency: Thin  Diet effective now                  Intake/Output Summary (Last 24 hours) at 02/16/2022 1143 Last data filed at 02/16/2022 0500 Gross per 24 hour  Intake 30 ml  Output 1200 ml  Net -1170 ml   Net IO  Since Admission: -3,980.33 mL [02/16/22 1143]  Wt Readings from Last 3 Encounters:  02/12/22 107 kg  02/05/22 107.8 kg  01/22/22 111.7 kg     Unresulted Labs (From admission, onward)     Start     Ordered   02/16/22 0500  Procalcitonin  Daily at 5am,   R      02/15/22 0943   02/16/22 0500  CBC  Daily at 5am,   R      02/15/22 0957   02/16/22 0500  Comprehensive metabolic panel  Daily at 5am,   R      02/15/22 0957   02/15/22 0943  Culture, blood (Routine X 2) w Reflex to ID Panel  BLOOD CULTURE X 2,   R      02/15/22 0943          Data Reviewed: I have personally reviewed following labs and imaging studies CBC: Recent Labs  Lab 02/12/22 2049 02/13/22 0148 02/13/22 1446 02/13/22 1447 02/13/22 1455 02/14/22 0110 02/15/22 0805 02/16/22 0136  WBC 8.9 9.7  --   --   --  9.0 14.1*  8.3  HGB 9.2* 9.2*   < > 8.8* 8.8* 7.8* 8.9* 8.6*  HCT 31.1* 32.1*   < > 26.0* 26.0* 26.8* 30.1* 29.2*  MCV 93.4 96.4  --   --   --  96.4 92.6 93.9  PLT 297 253  --   --   --  234 204 179   < > = values in this interval not displayed.   Basic Metabolic Panel: Recent Labs  Lab 02/12/22 0035 02/12/22 0051 02/13/22 0148 02/13/22 1446 02/13/22 1447 02/13/22 1455 02/14/22 0110 02/15/22 0805 02/16/22 0136  NA 140 139 139   < > 142 142 140 133* 140  K 4.1 4.1 4.5   < > 4.0 4.0 3.9 4.1 3.9  CL 100 104 104  --   --   --  107 100 103  CO2 27  --  27  --   --   --  26 24 29   GLUCOSE 94 89 137*  --   --   --  130* 155* 136*  BUN 14 15 10   --   --   --  11 13 10   CREATININE 0.83 0.90 0.92  --   --   --  0.95 0.84 0.70  CALCIUM 9.0  --  8.6*  --   --   --  8.2* 8.6* 8.7*   < > = values in this interval not displayed.   GFR: Estimated Creatinine Clearance: 106 mL/min (by C-G formula based on SCr of 0.7 mg/dL). Liver Function Tests: Recent Labs  Lab 02/12/22 0035 02/15/22 1139 02/16/22 0136  AST 20 23 30   ALT 27 22 26   ALKPHOS 66 61 69  BILITOT 0.3 0.2* 0.5  PROT 7.1 7.1 7.1  ALBUMIN 3.3* 3.1* 3.1*   No results for input(s): "LIPASE", "AMYLASE" in the last 168 hours. Recent Labs  Lab 02/15/22 1133 02/16/22 0136  AMMONIA 47* 64*   Coagulation Profile: Recent Labs  Lab 02/12/22 0035 02/12/22 2049 02/13/22 0710 02/14/22 1839  INR 2.2* 2.2* 1.8* 1.3*   Recent Results (from the past 240 hour(s))  Respiratory (~20 pathogens) panel by PCR     Status: Abnormal   Collection Time: 02/15/22 11:14 AM   Specimen: Nasopharyngeal Swab; Respiratory  Result Value Ref Range Status   Adenovirus NOT DETECTED NOT DETECTED Final   Coronavirus 229E NOT DETECTED NOT DETECTED Final  Comment: (NOTE) The Coronavirus on the Respiratory Panel, DOES NOT test for the novel  Coronavirus (2019 nCoV)    Coronavirus HKU1 NOT DETECTED NOT DETECTED Final   Coronavirus NL63 NOT DETECTED NOT  DETECTED Final   Coronavirus OC43 NOT DETECTED NOT DETECTED Final   Metapneumovirus NOT DETECTED NOT DETECTED Final   Rhinovirus / Enterovirus NOT DETECTED NOT DETECTED Final   Influenza A H1 2009 DETECTED (A) NOT DETECTED Final   Influenza B NOT DETECTED NOT DETECTED Final   Parainfluenza Virus 1 NOT DETECTED NOT DETECTED Final   Parainfluenza Virus 2 NOT DETECTED NOT DETECTED Final   Parainfluenza Virus 3 NOT DETECTED NOT DETECTED Final   Parainfluenza Virus 4 NOT DETECTED NOT DETECTED Final   Respiratory Syncytial Virus NOT DETECTED NOT DETECTED Final   Bordetella pertussis NOT DETECTED NOT DETECTED Final   Bordetella Parapertussis NOT DETECTED NOT DETECTED Final   Chlamydophila pneumoniae NOT DETECTED NOT DETECTED Final   Mycoplasma pneumoniae NOT DETECTED NOT DETECTED Final    Comment: Performed at [facility], [address] [place], [place] [address]    Antimicrobials: Anti-infectives (From admission, onward)    Start     Dose/Rate Route Frequency Ordered Stop   02/15/22 1430  oseltamivir (TAMIFLU) capsule 75 mg        75 mg Oral 2 times daily 02/15/22 1340 02/20/22 0959   02/12/22 2200  linezolid (ZYVOX) tablet 600 mg  Status:  Discontinued        600 mg Oral 2 times daily 02/12/22 1728 02/12/22 1739   02/12/22 2200  linezolid (ZYVOX) tablet 600 mg        600 mg Oral 2 times daily 02/12/22 1739 02/13/22 0912      Culture/Microbiology    Component Value Date/Time   SDES TRACHEAL ASPIRATE 02/03/2022 0727   SPECREQUEST NONE 02/03/2022 0727   CULT  02/03/2022 0727    ABUNDANT METHICILLIN RESISTANT STAPHYLOCOCCUS AUREUS   REPTSTATUS 02/05/2022 FINAL 02/03/2022 0727   Radiology Studies: CT HEAD WO CONTRAST ( )  Result Date: 02/16/2022 CLINICAL DATA:  TBI, follow-up ICH EXAM: CT HEAD WITHOUT CONTRAST TECHNIQUE: Contiguous axial images were obtained from the base of the skull through the vertex without intravenous contrast. RADIATION DOSE REDUCTION: This exam was  performed according to the departmental dose-optimization program which includes automated exposure control, adjustment of the mA and/or kV according to patient size and/or use of iterative reconstruction technique. COMPARISON:  Head CT from yesterday FINDINGS: Brain: No change in the parenchymal hemorrhage in the high and anterior right frontal lobe with local subarachnoid spread and mild edema in the adjacent brain. The hematoma measures up to 2.2 cm. No mass effect or new hemorrhage. No evidence of infarct or hydrocephalus. Vascular: No hyperdense vessel or unexpected calcification. Skull: Normal. Negative for fracture or focal lesion. Sinuses/Orbits: No acute finding. IMPRESSION: No progression of the right frontal hemorrhage Electronically Signed   By: Tiburcio Pea M.D.   On: 02/16/2022 05:27   DG Chest Port 1 View  Result Date: 02/15/2022 CLINICAL DATA:  Shortness of breath EXAM: PORTABLE CHEST 1 VIEW COMPARISON:  February 12, 2022 FINDINGS: The heart size and mediastinal contours are stable. The heart size is enlarged. Prior median sternotomy and CABG. Both lungs are clear. The visualized skeletal structures are unremarkable. IMPRESSION: No active disease. Electronically Signed   By: Sherian Rein M.D.   On: 02/15/2022 10:02   CT HEAD WO CONTRAST ( )  Result Date: 02/15/2022 CLINICAL DATA:  Change in  mental status EXAM: CT HEAD WITHOUT CONTRAST TECHNIQUE: Contiguous axial images were obtained from the base of the skull through the vertex without intravenous contrast. RADIATION DOSE REDUCTION: This exam was performed according to the departmental dose-optimization program which includes automated exposure control, adjustment of the mA and/or kV according to patient size and/or use of iterative reconstruction technique. COMPARISON:  Yesterday FINDINGS: Brain: High and anterior right frontal hematoma with adjacent edema and small volume subarachnoid hemorrhage, parenchymal component measuring 17 mm.  No interval progression. No gray matter infarct, hydrocephalus, or shift. Vascular: No hyperdense vessel or unexpected calcification. Skull: Normal. Negative for fracture or focal lesion. Sinuses/Orbits: No acute finding. IMPRESSION: Unchanged right frontal lobe hemorrhage with local subarachnoid spread and edema. No new or progressive finding. Electronically Signed   By: Jonathan  Watts M.D.   On: 02/15/2022 09:34   CT HEAD WO CONTRAST (<[id number]51-1OMarina Goo<[id number]747-3Marina Goo<[id number]516Marina Goo<[id number]843-6KeystoneMarina Goo<MEASUREMENT728362(84[facility]534)1Marina Goo<[id number]73Marina Goo<MEASUREMEN9T762(34[facility]612)Marina Goo<[id number]43 3ScMarina Goo<MEASUREMENT146251[facility]343-Marina Goo<MEASUREMEN9T462(314[facility])27 3LaMarina Goo<[id number]31Marina Goo<[id number]010-Marina Goo<MEASUREMENT796262[facility]25789Marina Goo<MEASUREMENT358262(385[facility])102 1[place] Goo<[id number]669-Marina Goo<[id number]46531YMarina Goo<MEASUREMENT61662(68[facility]440)Marina Goo<MEASUREMEN83T362[facility]75663HoosiMarina Goo<[id number]351Marina Goo<[id number](43[facility]54)Marina Goo<MEASUREMENT316562[facility]54440White MountMarina Goo<MEASUREMENT285862(709[facility])45 5WMarina Goo<MEASUREMENT55062[facility]5194Marina GoodellGaye [place]: 02/14/2022 CLINICAL DATA:  Subarachnoid hemorrhage follow-up. EXAM: CT HEAD WITHOUT CONTRAST TECHNIQUE: Contiguous axial images were obtained from the base of the skull through the vertex without intravenous contrast. RADIATION DOSE REDUCTION: This exam was performed according to the departmental dose-optimization program which includes automated exposure control, adjustment of the mA and/or kV according to patient size and/or use of iterative reconstruction technique. COMPARISON:  CT head without contrast 02/12/2022 FINDINGS: Brain: Previous area of hemorrhagic contusion in the anterior right frontal lobe has expanded. The parenchymal hemorrhage now measures 2.3 x 1.7 x 1.4 cm with increased surrounding edema. Subarachnoid and subdural hemorrhage along the falx is similar the prior exam. No distal hemorrhage is present. Mild atrophy and white matter changes are stable. The ventricles are of normal size. The brainstem and cerebellum are within normal limits. Vascular: Atherosclerotic calcifications are present within the cavernous internal carotid arteries bilaterally. No hyperdense vessel is present. Skull: Calvarium is intact. No focal lytic or blastic lesions are present. No significant extracranial soft tissue lesion is present. Sinuses/Orbits: The paranasal sinuses and mastoid air cells are clear. The globes and orbits are within normal limits. IMPRESSION: 1. Previous area of hemorrhagic contusion in the anterior right frontal lobe has expanded. The  parenchymal hemorrhage now measures 2.3 x 1.7 x 1.4 cm with increased surrounding edema. 2. Subarachnoid and subdural hemorrhage along the falx is similar the prior exam. 3. Stable atrophy and white matter disease. Critical Value/emergent results were called by telephone at the time of interpretation on 02/14/2022 at 5:44 pm to provider Jose [name] , who verbally acknowledged these results. Electronically Signed   By: Christopher  Mattern M.D.   On: 02/14/2022 17:44     LOS: 0 days   Jose Bauder, MD [facility]  02/16/2022, 11:43 AM

## 2022-02-16 NOTE — Consult Note (Signed)
Consultation Note Date: 02/16/2022 at 59  Patient Name: Jose [name]  DOB: Aug 19, 1955  MRN: [id number]  Age / Sex: 66 y.o., male  PCP: Elenore Paddy, MD Referring Physician: Antonieta Pert, MD  Reason for Consultation: Establishing goals of care  HPI/Patient Profile: 66 y.o. male  with past medical history of CAD admitted on 02/12/2022 with CAD status post CABG, HLD, aortic valve stenosis, antiphospholipid syndrome (chronic AC-Coumadin), COPD (4-6 L baseline, trilogy-compliance complicated by mask fitting issues), PE (2011), type 2 diabetes, vertebrobasilar insufficiency, OSA, and recurrent syncope admitted on 11/30 with fall from home.   He was hospitalized for similar at [facility] earlier in November.   CT revealed small area of subarachnoid hemorrhage.   12/3, pt had increased confusion and repeat CT scan revealed no change from prior.   PMT was consulted to discuss [place].  Clinical Assessment and Goals of Care: I have reviewed medical records including EPIC notes, labs and imaging, assessed the patient and then met with patient Jose [name])  to discuss diagnosis prognosis, GOC, EOL wishes, disposition and options.  I introduced Palliative Medicine as specialized medical care for people living with serious illness. It focuses on providing relief from the symptoms and stress of a serious illness. The goal is to improve quality of life for both the patient and the family.  We discussed a brief life review of the patient. He was born and raised in [place], [place]. He speaks of his father owning the [employer] in the [place] and him having access to all stores/restaurants in [facility] growing up. As an adult, he worked in [employer]. He is married, has 3 biological children (1 deceased daughter), 4 step children, and several step grandchildren.   As far as functional and nutritional status Jose [name] says he was  independent with all ADLs and doing "just fine" at home. He reports some difficulty sleeping with trilogy mask d/t fitting issues.  We discussed patient's current illness and what it means in the larger context of patient's on-going co-morbidities. I attempted to elicit values and goals of care important to the patient. Patient says he needs to think about what he wants. He says he has never had to think about it before but with all of his recent falls and his code blue experience at [facility] "a while back" he would like to revisit what is important to him.   He shares he would like to discuss everything with his wife involved. She is choosing not to visit in person as not to expose herself to his influenza A. Patient declined for me to speak with wife over the phone. He would like to discuss options with PMT before "looping her in".    Advance directives, surrogate decision maker, concepts specific to code status, artificial feeding and hydration, and rehospitalization were considered and discussed. Patient initially named his wife, but then shared he is considering naming his daughter [name], a NP, as his HCPOA. He says he would like to think about it more. HE also  mentioned that he would probably want to be a DNR but would like to talk more about it. Discussed advanced directives and MOST form. Copies of both given to patient. Pt not ready to make changes at this time but wants time to contemplate his wishes for boundaries of care.  Discussed with patient the importance of continued conversation with family and the medical providers regarding overall plan of care and treatment options, ensuring decisions are within the context of the patient's values and GOCs.  Questions and concerns were addressed. Patient in agreement to meet with PMT again tomorrow, 12/5.  Primary Decision Maker PATIENT  Physical Exam Constitutional:      General: He is not in acute distress.    Appearance: He is obese. He  is not ill-appearing.  HENT:     Head: Normocephalic.     Mouth/Throat:     Mouth: Mucous membranes are moist.  Eyes:     Pupils: Pupils are equal, round, and reactive to light.  Cardiovascular:     Rate and Rhythm: Normal rate.     Pulses: Normal pulses.  Pulmonary:     Effort: Pulmonary effort is normal.  Abdominal:     Palpations: Abdomen is soft.  Musculoskeletal:        General: Normal range of motion.     Comments: MAETC  Neurological:     Mental Status: He is alert and oriented to person, place, and time.  Psychiatric:        Mood and Affect: Mood normal.        Behavior: Behavior normal.        Thought Content: Thought content normal.        Judgment: Judgment normal.     Palliative Assessment/Data: 70%     Thank you for this consult. Palliative medicine will continue to follow and assist holistically.   Time Total: 75 minutes Greater than 50%  of this time was spent counseling and coordinating care related to the above assessment and plan.  Signed by: Jose Hawks, DNP, FNP-BC Palliative Medicine    Please contact Palliative Medicine Team phone at [phone] for questions and concerns.  For individual provider: See Shea Evans

## 2022-02-16 NOTE — Progress Notes (Signed)
Physical Therapy Treatment Patient Details Name: Jose [name] MRN: [id number] DOB: 03-02-1956 Today's Date: 02/16/2022   History of Present Illness Pt is a 66 y/o male presenting on 11/30 after fall. CT head with small subarachnoid hemorrhage at superior medial right convexity. Noted multiple hospitalizations for falls/syncope in 01/2022. Plan for diagnostic right and L heart cath, TAVR evaluation 12/1. Head CT 12/2- Hemorrhagic contusion in right frontal lobe expanded. + Flu 02/15/22. PMH includes: CAD s/p CABG, aortic valve stenosis, PVD, COPD, chronic respiratory failure on 4-6L O2, PE, T2DM, syncope.    PT Comments    Patient progressing well towards PT goals. Session focused on progressive ambulation and functional mobility. Pt reports feeling better today being off BiPAP. Requires Min A for standing and gait training with use of RW for support. Noted to fatigue quickly with 2-3/4 DOE on 3L/min 02 Jose [name]. Sp02 remained >88% with activity. Per wife (on phone), pt's cognition seems to be close to baseline today. Encouraged walking 2 more times today if able with mobility tech and nursing staff to improve endurance, mobility and strength. Will continue to follow.    Recommendations for follow up therapy are one component of a multi-disciplinary discharge planning process, led by the attending physician.  Recommendations may be updated based on patient status, additional functional criteria and insurance authorization.  Follow Up Recommendations  Home health PT     Assistance Recommended at Discharge Frequent or constant Supervision/Assistance  Patient can return home with the following Help with stairs or ramp for entrance;A little help with bathing/dressing/bathroom;A little help with walking and/or transfers;Assist for transportation;Direct supervision/assist for medications management;Direct supervision/assist for financial management;Assistance with cooking/housework   Equipment  Recommendations  Other (comment) (shower stool)    Recommendations for Other Services       Precautions / Restrictions Precautions Precautions: Fall;Other (comment) Precaution Comments: Recurrent syncope & falls, orthostatic Restrictions Weight Bearing Restrictions: No     Mobility  Bed Mobility               General bed mobility comments: Up in chair upon PT arrival.    Transfers Overall transfer level: Needs assistance Equipment used: Rolling walker (2 wheels) Transfers: Sit to/from Stand Sit to Stand: Min assist           General transfer comment: Min A to power to standing from chair x1 with cues for hand placement/technique. No dizziness.    Ambulation/Gait Ambulation/Gait assistance: Min assist Gait Distance (Feet): 85 Feet Assistive device: Rolling walker (2 wheels) Gait Pattern/deviations: Step-through pattern, Decreased stride length Gait velocity: decr Gait velocity interpretation: <1.31 ft/sec, indicative of household ambulator   General Gait Details: Slow, mildly unsteady gait with decreased foot clearance bilaterally. 2-3/4 DOE. Sp02 stayed >88% on 3L/min 02 Jose [name].   Stairs             Wheelchair Mobility    Modified Rankin (Stroke Patients Only)       Balance Overall balance assessment: Needs assistance Sitting-balance support: Feet supported, No upper extremity supported Sitting balance-[name] Scale: Good     Standing balance support: During functional activity, Reliant on assistive device for balance Standing balance-[name] Scale: Poor Standing balance comment: Reliant on RW                            Cognition Arousal/Alertness: Awake/alert Behavior During Therapy: WFL for tasks assessed/performed Overall Cognitive Status: No family/caregiver present to determine baseline cognitive functioning Area of Impairment: Problem solving,  Orientation                 Orientation Level: Disoriented to, Situation    Memory: Decreased short-term memory Following Commands: Follows one step commands consistently     Problem Solving: Slow processing, Requires verbal cues General Comments: Pt orienting to month/year and day due to just having asked his wife per report. Initially not able to recall why he was in the hospital, stating flu. Per wife (on phone), pt seems to be back to baseline cognition. Requires cues to stay on task, easily re directable.        Exercises      General Comments General comments (skin integrity, edema, etc.): Sp02 stayed >88% on 3L/min 02 Jose [name] with activity.      Pertinent Vitals/Pain Pain Assessment Pain Assessment: No/denies pain    Home Living                          Prior Function            PT Goals (current goals can now be found in the care plan section) Progress towards PT goals: Progressing toward goals    Frequency    Min 3X/week      PT Plan Current plan remains appropriate    Co-evaluation              AM-PAC PT "6 Clicks" Mobility   Outcome Measure  Help needed turning from your back to your side while in a flat bed without using bedrails?: A Little Help needed moving from lying on your back to sitting on the side of a flat bed without using bedrails?: A Little Help needed moving to and from a bed to a chair (including a wheelchair)?: A Little Help needed standing up from a chair using your arms (e.g., wheelchair or bedside chair)?: A Little Help needed to walk in hospital room?: A Little Help needed climbing 3-5 steps with a railing? : A Lot 6 Click Score: 17    End of Session Equipment Utilized During Treatment: Oxygen;Gait belt Activity Tolerance: Patient limited by fatigue Patient left: in chair;with call bell/phone within reach Nurse Communication: Mobility status PT Visit Diagnosis: Unsteadiness on feet (R26.81);Other abnormalities of gait and mobility (R26.89);Difficulty in walking, not elsewhere classified  (R26.2);Repeated falls (R29.6);Muscle weakness (generalized) (M62.81)     Time: 3295-1884 PT Time Calculation (min) (ACUTE ONLY): 24 min  Charges:  $Gait Training: 8-22 mins $Therapeutic Activity: 8-22 mins                     Jose [name], PT, DPT Acute Rehabilitation Services Secure chat preferred Office [phone]      Blake Divine A Lanier Ensign 02/16/2022, 12:44 PM

## 2022-02-16 NOTE — Progress Notes (Signed)
Patient taken off of BIPAP by RN, currently tolerating well. BIPAP on stand by for patient.

## 2022-02-16 NOTE — Progress Notes (Signed)
Pt on standby in room. RT will place pt on bipap if needed throughout night.

## 2022-02-16 NOTE — Progress Notes (Signed)
NAME:  Jose [name], MRN:  [id number], DOB:  09-18-1955, LOS: 0 ADMISSION DATE:  02/12/2022, CONSULTATION DATE:  02/15/22 REFERRING MD:  Lupita Leash  -TRH, CHIEF COMPLAINT:  Ams   History of Present Illness:  66 yo M PMH COPD w home trilogy noncompliance and baseline 4-6L [place] req, Antiphospholipid syndrome, PE, chronic coumadin, posterior circ stenosis, aortic stenosis, syncope of unclear etiology who has had several recent hospitalizations r/t syncope and/or bleeding. Last hospitalization (11/20-11/24) he was intubated in field for unresponsiveness and ?cardiac arrest though unclear. Neuro was involved for possibe sz though there were none captured on EEG. Dr. Cecil Cobbs note 11/24 very thoughtfully explains the pts prodrome before falling, which was less c/w sz and more likely syncope of other etiology. Additionally, noted that his posterior circ stenosis is not felt likely to cause true vertebrobasilar artery insufficiency   He was admitted again 11/30 following a fall at home. He had a CT H which revealed a small SAH for which NSGY was consulted, there ws not a surgical indication. Repeat CT H with slight incr in size of SAH, without mass effect or midline shift.  Coumadin has been held.  Cards following as well given syncope -- had cath 12/1 which revealed occluded RCA stent. Has moderate AS, and it was felt that TAVR would not be helpful in preventing falls.    12/3 more somnolent. PCCM consulted with question of airway protection with need for CT H.  Nb-- On chart review, discussion w Rns, RT, MD -- pt has not been wearing BiPAP this admission at night.   Pertinent  Medical History  COPD Chronic hypoxia and hypercarbia AS CAD APL PE Chronic anticoagulation   Significant Hospital Events: Including procedures, antibiotic start and stop dates in addition to other pertinent events   11/30 admit after fall, small tSAH. No NSGY surgical need. 12/1 cardiac cath 12/2 no sx with 247m walk other  than SOB w cards. TAVR not felt to have great efficacy in preventing syncopal events and falls  12/3 more altered. PCCM consulted   12/4 mentation back to baseline  Interim History / Subjective:   No complaints this morning. Feels his mentation has improved. Did wear BiPAP last night. Uses trilogy at home, but has had some mask issues recently.   Objective   Blood pressure (!) 100/48, pulse 66, temperature 97.7 F (36.5 C), temperature source Oral, resp. rate 19, height 5\' 7"  (1.702 m), weight 107 kg, SpO2 100 %.    Vent Mode: PCV FiO2 (%):  [30 %] 30 % Set Rate:  [15 bmp] 15 bmp PEEP:  [7 cmH20] 7 cmH20   Intake/Output Summary (Last 24 hours) at 02/16/2022 1122 Last data filed at 02/16/2022 0500 Gross per 24 hour  Intake 30 ml  Output 1200 ml  Net -1170 ml    Filed Weights   02/12/22 0846  Weight: 107 kg    Examination:  General: Middle aged male in NAD HENT: NCAT, no JVD Lungs: Clear bilateral breath sounds. No wheeze. No distress on 2L with sats 100.  Cardiovascular: RRR, no MRG Abdomen: Sift, NT, ND Extremities: Trace lower extremity edema.  Neuro: Alert and oriented x 3. Non-focal  Resolved Hospital Problem list     Assessment & Plan:   Very complex constellation of sx. Falls/syncope etiology historically not felt to be neurogenic In etiology, this admission seems like cardiogenic syncope is also not very likely to be primary cause.  -PCCM is consulted 12/3 for AMS and resp sx :  Acute on chronic mixed respiratory failure Severe COPD FEV1 33% Noncompliant with home NIV Influenza A H1 Acute encephalopathy - AMS improved with the addition of BiPAP. Likely CO2 narcosis. - CT head stable and neurosurgery has assessed.  - Ammonia elevated 62 P - Nocturnal NIV mandatory - Sending PCT and Ammonia - Tamiflu - Continue Breo, Incruse in place of home Trellegy - AC per neurosurg - Lactulose - Delirium precautions  - Defer steroids   Best Practice (right  click and "Reselect all SmartList Selections" daily)   Per primary   Labs   CBC: Recent Labs  Lab 02/12/22 2049 02/13/22 0148 02/13/22 1446 02/13/22 1447 02/13/22 1455 02/14/22 0110 02/15/22 0805 02/16/22 0136  WBC 8.9 9.7  --   --   --  9.0 14.1* 8.3  HGB 9.2* 9.2*   < > 8.8* 8.8* 7.8* 8.9* 8.6*  HCT 31.1* 32.1*   < > 26.0* 26.0* 26.8* 30.1* 29.2*  MCV 93.4 96.4  --   --   --  96.4 92.6 93.9  PLT 297 253  --   --   --  234 204 179   < > = values in this interval not displayed.     Basic Metabolic Panel: Recent Labs  Lab 02/12/22 0035 02/12/22 0051 02/13/22 0148 02/13/22 1446 02/13/22 1447 02/13/22 1455 02/14/22 0110 02/15/22 0805 02/16/22 0136  NA 140 139 139   < > 142 142 140 133* 140  K 4.1 4.1 4.5   < > 4.0 4.0 3.9 4.1 3.9  CL 100 104 104  --   --   --  107 100 103  CO2 27  --  27  --   --   --  26 24 29   GLUCOSE 94 89 137*  --   --   --  130* 155* 136*  BUN 14 15 10   --   --   --  11 13 10   CREATININE 0.83 0.90 0.92  --   --   --  0.95 0.84 0.70  CALCIUM 9.0  --  8.6*  --   --   --  8.2* 8.6* 8.7*   < > = values in this interval not displayed.    GFR: Estimated Creatinine Clearance: 106 mL/min (by C-G formula based on SCr of 0.7 mg/dL). Recent Labs  Lab 02/13/22 0148 02/14/22 0110 02/15/22 0805 02/15/22 1133 02/16/22 0136  PROCALCITON  --   --   --  <0.10 <0.10  WBC 9.7 9.0 14.1*  --  8.3     Liver Function Tests: Recent Labs  Lab 02/12/22 0035 02/15/22 1139 02/16/22 0136  AST 20 23 30   ALT 27 22 26   ALKPHOS 66 61 69  BILITOT 0.3 0.2* 0.5  PROT 7.1 7.1 7.1  ALBUMIN 3.3* 3.1* 3.1*    No results for input(s): "LIPASE", "AMYLASE" in the last 168 hours. Recent Labs  Lab 02/15/22 1133 02/16/22 0136  AMMONIA 47* 64*    ABG    Component Value Date/Time   PHART 7.37 02/15/2022 1935   PCO2ART 58 (H) 02/15/2022 1935   PO2ART 129 (H) 02/15/2022 1935   HCO3 33.5 (H) 02/15/2022 1935   TCO2 32 02/13/2022 1455   O2SAT 97.7 02/15/2022  1150     Coagulation Profile: Recent Labs  Lab 02/12/22 0035 02/12/22 2049 02/13/22 0710 02/14/22 1839  INR 2.2* 2.2* 1.8* 1.3*     Cardiac Enzymes: No results for input(s): "CKTOTAL", "CKMB", "CKMBINDEX", "TROPONINI" in the last 168 hours.  HbA1C: Hgb A1c MFr Bld  Date/Time Value Ref Range Status  02/02/2022 08:15 AM 5.6 4.8 - 5.6 % Final    Comment:    (NOTE) Pre diabetes:          5.7%-6.4%  Diabetes:              >6.4%  Glycemic control for   <7.0% adults with diabetes   11/06/2021 12:01 AM 6.5 (H) 4.8 - 5.6 % Final    Comment:    (NOTE) Pre diabetes:          5.7%-6.4%  Diabetes:              >6.4%  Glycemic control for   <7.0% adults with diabetes     CBG: Recent Labs  Lab 02/15/22 1756 02/16/22 0008 02/16/22 0623  GLUCAP 109* 140* 118*    Review of Systems:   Unreliable due to encephalopathy   Past Medical History:  He,  has a past medical history of Antiphospholipid antibody positive, Antiphospholipid antibody syndrome ([place]), CAD (coronary artery disease), CAD S/P percutaneous coronary angioplasty ('98, '04, Feb 2017), 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   Surgical History:   Past Surgical History:  Procedure Laterality Date   APPENDECTOMY     APPLICATION OF WOUND VAC Left 03/18/2019   Procedure: Application Of Wound Vac;  Surgeon: Newt Minion, MD;  Location: [facility];  Service: Orthopedics;  Laterality: Left;   BACK SURGERY     BACK SURGERY  '89, '06   COLONOSCOPY     CORONARY ANGIOPLASTY WITH STENT PLACEMENT     CORONARY ANGIOPLASTY WITH STENT PLACEMENT  '98, '04, Feb 2017   I & D EXTREMITY Left 03/15/2019   Procedure:  DEBRIDEMENT LEFT THIGH;  Surgeon: Newt Minion, MD;  Location: [facility];  Service: Orthopedics;  Laterality: Left;   INCISION AND DRAINAGE OF WOUND Left 03/18/2019   Procedure: LEFT THIGH DEBRIDEMENT;  Surgeon: Newt Minion, MD;  Location: [facility];  Service: Orthopedics;  Laterality: Left;   RIGHT/LEFT HEART CATH AND CORONARY/GRAFT ANGIOGRAPHY N/A 02/13/2022   Procedure: RIGHT/LEFT HEART CATH AND CORONARY/GRAFT ANGIOGRAPHY;  Surgeon: Adrian Prows, MD;  Location: [facility];  Service: Cardiovascular;  Laterality: N/A;     Social History:   reports that he quit smoking about 3 years ago. His smoking use included cigarettes. He has a 20.00 pack-year smoking history. He has never used smokeless tobacco. He reports that he does not drink alcohol and does not use drugs.   Family History:  His family history includes CAD (age of onset: 22) in his father.   Allergies Allergies  Allergen Reactions   Melatonin Anxiety   Altace [Ramipril] Cough     Home Medications  Prior to Admission medications   Medication Sig Start Date End Date Taking? Authorizing Provider  acetaminophen (TYLENOL) 500 MG tablet Take 1,000 mg by mouth every 6 (six) hours as needed for mild pain.   Yes [provider]  albuterol (PROVENTIL) (5 MG/ML) 0.5% nebulizer solution Take 0.5 mLs (2.5 mg total) by nebulization every 6 (six) hours as needed for wheezing or shortness of breath. 12/27/18  Yes Wyvonnia Dusky, MD  albuterol (VENTOLIN HFA) 108 (90 Base) MCG/ACT inhaler Inhale 2 puffs into the lungs every  6 (six) hours as needed for wheezing or shortness of breath.   Yes [provider]  aspirin EC 81 MG EC tablet Take 1 tablet (81 mg total) by mouth daily. Swallow whole. 01/23/20  Yes Dagar, Meredith Staggers, MD  DULoxetine (CYMBALTA) 60 MG capsule Take 60 mg by mouth daily. Take one capsule (60 mg) along with 30 mg capsule to equal 90 mg daily   Yes [provider]  DULoxetine HCl 30 MG CSDR Take 30 mg by  mouth daily. 11/02/21  Yes [provider]  Fluticasone-Umeclidin-Vilant (TRELEGY ELLIPTA IN) Inhale 1 puff into the lungs daily.   Yes [provider]  gabapentin (NEURONTIN) 600 MG tablet Take 600 mg by mouth 2 (two) times daily. 09/02/21  Yes [provider]  glimepiride (AMARYL) 4 MG tablet Take 4 mg by mouth 2 (two) times daily. 12/04/20  Yes [provider]  ipratropium-albuterol (DUONEB) 0.5-2.5 (3) MG/3ML SOLN Take 3 mLs by nebulization every 4 (four) hours as needed (For shortness of breath or wheezing).   Yes [provider]  linezolid (ZYVOX) 600 MG tablet Take 1 tablet (600 mg total) by mouth 2 (two) times daily. 02/06/22  Yes Wouk, Ailene Rud, MD  metoprolol tartrate (LOPRESSOR) 25 MG tablet Take 1 tab daily for for 4 days then 1 tab every other day for 4 days then stop Patient taking differently: Take 12.5 mg by mouth 2 (two) times daily. 02/06/22  Yes Wouk, Ailene Rud, MD  nitroGLYCERIN (NITROSTAT) 0.4 MG SL tablet Place 0.4 mg under the tongue every 5 (five) minutes as needed for chest pain.  07/07/16  Yes [provider]  OXYGEN Inhale 4-6 L into the lungs daily.   Yes [provider]  pantoprazole (PROTONIX) 40 MG tablet Take 40 mg by mouth 2 (two) times daily.   Yes [provider]  rosuvastatin (CRESTOR) 10 MG tablet Take 10 mg by mouth every evening. 07/07/16  Yes [provider]  tamsulosin (FLOMAX) 0.4 MG CAPS capsule Take 1 capsule (0.4 mg total) by mouth at bedtime. 02/06/22  Yes Wouk, Ailene Rud, MD  topiramate (TOPAMAX) 50 MG tablet Take 1.5 tablets (75 mg total) by mouth 2 (two) times daily. Patient taking differently: Take 75 mg by mouth 3 (three) times daily. 02/06/22  Yes Wouk, Ailene Rud, MD  torsemide (DEMADEX) 20 MG tablet Take 20 mg by mouth daily.   Yes [provider]  warfarin (COUMADIN) 5 MG tablet Take 5 mg by mouth every evening.   Yes [provider]   enoxaparin (LOVENOX) 120 MG/0.8ML injection Inject 0.74 mLs (111 mg total) into the skin every 12 (twelve) hours for 4 days. Patient not taking: Reported on 02/12/2022 01/26/22 01/30/22  Damita Lack, MD  senna-docusate (SENOKOT-S) 8.6-50 MG tablet Take 1 tablet by mouth 2 (two) times daily as needed for moderate constipation. 01/26/22   Damita Lack, MD     Critical care time:      Georgann Housekeeper, AGACNP-BC [facility]  See Amion for personal pager PCCM on call pager [phone] until 7pm. Please call Elink 7p-7a. [phone]  02/16/2022 11:23 AM

## 2022-02-16 NOTE — Progress Notes (Signed)
Subjective:  Patient seen and examined at bedside, resting comfortably. No acute events overnight. He is feeling a lot better now that he is no longer febrile as he did test positive for influenza A. He is worried about getting his wife and grandchildren sick. Patient denies chest pain, palpitations, diaphoresis, headache, blurry vision.   Intake/Output from previous day:  I/O last 3 completed shifts: In: 30 [P.O.:30] Out: 2400 [Urine:2400] No intake/output data recorded. Net IO Since Admission: -3,980.33 mL [02/16/22 1552]  Blood pressure (!) 103/55, pulse (!) 59, temperature 97.7 F (36.5 C), temperature source Oral, resp. rate 20, height _0  (1.702 m), weight 107 kg, SpO2 100 %. Physical Exam Vitals reviewed.  HENT:     Head: Normocephalic and atraumatic.  Neck:     Vascular: Carotid bruit present.  Cardiovascular:     Rate and Rhythm: Normal rate and regular rhythm.     Pulses: Normal pulses.     Heart sounds: Murmur heard.  Pulmonary:     Effort: Pulmonary effort is normal.     Breath sounds: Wheezing present.  Abdominal:     General: Bowel sounds are normal.  Musculoskeletal:     Right lower leg: No edema.     Left lower leg: No edema.  Skin:    Comments: Bilateral lower extremity chronic venostasis pigmentation and skin thickening  Neurological:     Mental Status: He is alert.     Lab Results: Lab Results  Component Value Date   NA 140 02/16/2022   K 3.9 02/16/2022   CO2 29 02/16/2022   GLUCOSE 136 (H) 02/16/2022   BUN 10 02/16/2022   CREATININE 0.70 02/16/2022   CALCIUM 8.7 (L) 02/16/2022   GFRNONAA >60 02/16/2022    BNP (last 3 results) Recent Labs    01/21/22 2005 02/02/22 0410 02/16/22 0136  BNP 87.4 302.8* 318.1*    ProBNP (last 3 results) No results for input(s): "PROBNP" in the last 8760 hours.    Latest Ref Rng & Units 02/16/2022    1:36 AM 02/15/2022    8:05 AM 02/14/2022    1:10 AM  BMP  Glucose 70 - 99 mg/dL 136  155  130   BUN 8 -  23 mg/dL _1 Creatinine 0.61 - 1.24 mg/dL 0.70  0.84  0.95   Sodium 135 - 145 mmol/L 140  133  140   Potassium 3.5 - 5.1 mmol/L 3.9  4.1  3.9   Chloride 98 - 111 mmol/L 103  100  107   CO2 22 - 32 mmol/L _2 Calcium 8.9 - 10.3 mg/dL 8.7  8.6  8.2       Latest Ref Rng & Units 02/16/2022    1:36 AM 02/15/2022   11:39 AM 02/12/2022   12:35 AM  Hepatic Function  Total Protein 6.5 - 8.1 g/dL 7.1  7.1  7.1   Albumin 3.5 - 5.0 g/dL 3.1  3.1  3.3   AST 15 - 41 U/L _3 ALT 0 - 44 U/L _4 Alk Phosphatase 38 - 126 U/L 69  61  66   Total Bilirubin 0.3 - 1.2 mg/dL 0.5  0.2  0.3   Bilirubin, Direct 0.0 - 0.2 mg/dL  <0.1        Latest Ref Rng & Units 02/16/2022    1:36 AM 02/15/2022    8:05 AM 02/14/2022  1:10 AM  CBC  WBC 4.0 - 10.5 K/uL 8.3  14.1  9.0   Hemoglobin 13.0 - 17.0 g/dL 8.6  8.9  7.8   Hematocrit 39.0 - 52.0 % 29.2  30.1  26.8   Platelets 150 - 400 K/uL 179  204  234    Lipid Panel     Component Value Date/Time   CHOL 156 08/16/2016 0922   TRIG 133 02/04/2022 0030   HDL 28 (L) 08/16/2016 0922   CHOLHDL 5.6 08/16/2016 0922   VLDL 64 (H) 08/16/2016 0922   LDLCALC 64 08/16/2016 0922   Cardiac Panel (last 3 results) No results for input(s): "CKTOTAL", "CKMB", "TROPONINI", "RELINDX" in the last 72 hours.  HEMOGLOBIN A1C Lab Results  Component Value Date   HGBA1C 5.6 02/02/2022   MPG 114.02 02/02/2022   TSH No results for input(s): "TSH" in the last 8760 hours. Imaging: Imaging results have been reviewed and CT HEAD WO CONTRAST (5MM)  Result Date: 02/16/2022 CLINICAL DATA:  TBI, follow-up ICH EXAM: CT HEAD WITHOUT CONTRAST TECHNIQUE: Contiguous axial images were obtained from the base of the skull through the vertex without intravenous contrast. RADIATION DOSE REDUCTION: This exam was performed according to the departmental dose-optimization program which includes automated exposure control, adjustment of the mA and/or kV according to  patient size and/or use of iterative reconstruction technique. COMPARISON:  Head CT from yesterday FINDINGS: Brain: No change in the parenchymal hemorrhage in the high and anterior right frontal lobe with local subarachnoid spread and mild edema in the adjacent brain. The hematoma measures up to 2.2 cm. No mass effect or new hemorrhage. No evidence of infarct or hydrocephalus. Vascular: No hyperdense vessel or unexpected calcification. Skull: Normal. Negative for fracture or focal lesion. Sinuses/Orbits: No acute finding. IMPRESSION: No progression of the right frontal hemorrhage Electronically Signed   By: Jorje Guild M.D.   On: 02/16/2022 05:27   DG Chest Port 1 View  Result Date: 02/15/2022 CLINICAL DATA:  Shortness of breath EXAM: PORTABLE CHEST 1 VIEW COMPARISON:  February 12, 2022 FINDINGS: The heart size and mediastinal contours are stable. The heart size is enlarged. Prior median sternotomy and CABG. Both lungs are clear. The visualized skeletal structures are unremarkable. IMPRESSION: No active disease. Electronically Signed   By: Abelardo Diesel M.D.   On: 02/15/2022 10:02   CT HEAD WO CONTRAST (5MM)  Result Date: 02/15/2022 CLINICAL DATA:  Change in mental status EXAM: CT HEAD WITHOUT CONTRAST TECHNIQUE: Contiguous axial images were obtained from the base of the skull through the vertex without intravenous contrast. RADIATION DOSE REDUCTION: This exam was performed according to the departmental dose-optimization program which includes automated exposure control, adjustment of the mA and/or kV according to patient size and/or use of iterative reconstruction technique. COMPARISON:  Yesterday FINDINGS: Brain: High and anterior right frontal hematoma with adjacent edema and small volume subarachnoid hemorrhage, parenchymal component measuring 17 mm. No interval progression. No gray matter infarct, hydrocephalus, or shift. Vascular: No hyperdense vessel or unexpected calcification. Skull: Normal.  Negative for fracture or focal lesion. Sinuses/Orbits: No acute finding. IMPRESSION: Unchanged right frontal lobe hemorrhage with local subarachnoid spread and edema. No new or progressive finding. Electronically Signed   By: Jorje Guild M.D.   On: 02/15/2022 09:34    Cardiac Studies:  EKG:  EKG 02/12/2022: Normal sinus rhythm at rate of 76 bpm, normal axis, right bundle branch block.  No evidence of ischemia.   Carotid artery duplex 12/06/2020: 1.  Plaque involving distal  right common carotid artery extending into  proximal right cervical internal carotid artery.  Velocity measurements suggest 50-69% degree stenosis in the right cervical internal carotid artery.  2.  Plaque involving distal left common carotid artery extending into the proximal left internal carotid artery.  Velocity measurements suggest less than 50% degree stenosis in left cervical internal carotid artery.  3.  Additional prominent plaque in the proximal left external carotid artery with markedly elevated velocities in left external carotid artery.  4.  Antegrade flow in both vertebral arteries.    Lower extremity arterial duplex 09/02/2021: Current ABI: Rt .87,Lt 1.01  Mild diffuse disease throughout the lower extremity with mildly abnormal biphasic waveforms.     Tilt Table Test 01/19/2022: Passive and provocative tilt table test (isuprel) with stable BP and HR, no induced vasovagal syncope. Negative carotid massage. Stable BP/HR with valsalva maneuver, deep breathing, and cold pressor testing. Normal postganglionic sympathetic sudomotor axon function by QSWEAT.    Echocardiogram 02/03/2022:  1. Peak gradient of 33 mmHg despite mildly decrease LV stroke volume. DVI 0.26. Gradients were obtained ~ 1 minute after contrast administration with no contrast in the LV cavity. 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 The  aortic valve is calcified. There is moderate calcification of the aortic  valve. There is moderate thickening of the aortic valve. Aortic valve regurgitation is mild. Severe aortic valve stenosis. Aortic valve area, by VTI measures 0.80 cm. Aortic valve  mean gradient measures 33.0 mmHg. Aortic valve Vmax measures 3.66 m/s.  2. Left ventricular ejection fraction, by estimation, is 55 to 60%. The left ventricle has normal function. The left ventricle has no regional wall motion abnormalities. The left ventricular internal cavity size was mildly dilated. Left ventricular  diastolic parameters are indeterminate.  3. Right ventricular systolic function was not well visualized. The right ventricular size is not well visualized.  4. The mitral valve is grossly normal. No evidence of mitral valve regurgitation. No evidence of mitral stenosis.  5. The inferior vena cava is dilated in size with >50% respiratory variability, suggesting right atrial pressure of 8 mmHg   Coronary /Chest/Abd CT  TAVR protocol 02/13/2022: 1. Trileaflet aortic valve with moderate calcifications (AV calcium score 1194). AV calcium score and visual evaluation of valve opening suggest more likely moderate aortic stenosis 2. Aortic annulus measures 39m x 21min diameter with perimeter 74 mm and area 417 mm^2. No annular or LVOT calcifications. Annular measurements are suitable for delivery of 2349mdwards Sapien 3 valve. 3. Sufficient coronary to annulus distance. 4. Optimum Fluoroscopic Angle for Delivery:  LAO 17 CAU 8 5. S/p CABG with patent LIMA-LAD, SVG-RCA, and SVG-OM1   Right left Heart Catheterization 02/13/22:  RA 8/7, mean 6 mmHg. RV 47/1, EDP 10 mmHg; PA 43/14, mean 25 mmHg.   PA saturation 59%.  Aortic saturation 98%.  PW 8/8, mean 7 mmHg. QP/QS 1.00.  CO 5.91/CI 2.73 LV: 131/3, EDP 19 mmHg.  Ao 1 and 14/42, mean 67 mmHg.  Peak to peak pressure gradient 23 and mean pressure gradient 30 mmHg.  Calculated aortic valve area 1.22 cm. LV gram not performed. LM: Large-caliber vessel,  normal. LAD: Gives origin to a very large D1 and D2, has ostial 20 to 30% stenosis in the diagonals.  LAD itself is very mild disease.  Distal LAD has come to the feeling from the LIMA to LAD. LIMA to LAD: Widely patent. LCx: Proximal segment has a 40 to 50% stenosis.  Distal CX  is occluded.  SVG to OM1-distal CX jump graft: Widely patent.   Impression: Widely patent native vessels on the left system, no significant stenosis was identified however has been grafted.  Only lesion that was felt to be significant was distal circumflex which is occluded.  Right coronary artery has a prior stent that is occluded, very large RCA which is now bypassed and graft retrogradely fills the native RCA all the way back to the stent at the ostium.  70 mL contrast utilized.    CABG x3 (rSVG-OM1, rSVG-distal RCA, LIMA-distal LAD) on 09/07/2017  Recent Results (from the past 43800 hour(s))  ECHOCARDIOGRAM COMPLETE   Collection Time: 02/03/22  9:51 AM  Result Value   Weight 3,830.71   Height 70   BP 122/64   Single Plane A2C EF 57.4   Single Plane A4C EF 57.9   Calc EF 56.8   S' Lateral 4.99   AR max vel 0.94   AV Area VTI 0.80   AV Mean grad 33.0   AV Peak grad 53.6   Ao pk vel 3.66   Area-P 1/2 4.26   AV Area mean vel 0.97   Narrative      ECHOCARDIOGRAM REPORT       Patient Name:   Jose [name] Date of Exam: 02/03/2022 Medical Rec #:  [id number]     Height:       70.0 in Accession #:    [id number]    Weight:       239.4 lb Date of Birth:  Feb 18, 1956      BSA:          2.253 m Patient Age:    66 years      BP:           92/51 mmHg Patient Gender: M             HR:           86 bpm. Exam Location:  Inpatient  Procedure: 2D Echo, Cardiac Doppler, Color Doppler and Intracardiac            Opacification Agent  Indications:    Syncope R55   History:        Patient has no prior history of Echocardiogram examinations.                 CAD, Prior CABG, COPD and PAD, Aortic Valve Disease; Risk                  Factors:Current Smoker. 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   Sonographer:    Darlina Sicilian RDCS Referring Phys: [id number] Francesca Jewett    Sonographer Comments: Suboptimal apical window, suboptimal parasternal window, suboptimal subcostal window, echo performed with patient supine and on artificial respirator and Technically difficult study due to poor echo windows. IMPRESSIONS    1. Peak gradient of 33 mmHg despite mildly decrease LV stroke volume. DVI 0.26. Gradients were obtained ~ 1 minute after contrast administration with no contrast in the LV cavity. 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 The  aortic valve is calcified. There is moderate calcification of the aortic valve. There is moderate thickening of the aortic valve. Aortic valve regurgitation is mild. Severe aortic valve stenosis. Aortic valve area, by VTI measures 0.80 cm. Aortic valve  mean gradient measures 33.0 mmHg. Aortic valve Vmax measures 3.66 m/s.  2.  Left ventricular ejection fraction, by estimation, is 55 to 60%. The left ventricle has normal function. The left ventricle has no regional wall motion abnormalities. The left ventricular internal cavity size was mildly dilated. Left ventricular  diastolic parameters are indeterminate.  3. Right ventricular systolic function was not well visualized. The right ventricular size is not well visualized.  4. The mitral valve is grossly normal. No evidence of mitral valve regurgitation. No evidence of mitral stenosis.  5. The inferior vena cava is dilated in size with >50% respiratory variability, suggesting right atrial pressure of 8 mmHg.  Comparison(s): No prior Echocardiogram.  FINDINGS  Left Ventricle: Left ventricular ejection fraction, by estimation, is 55 to 60%. The left ventricle has normal function. The left ventricle has no regional wall motion abnormalities. Definity contrast agent  was given IV to delineate the left ventricular  endocardial borders. The left ventricular internal cavity size was mildly dilated. There is no left ventricular hypertrophy. Left ventricular diastolic parameters are indeterminate.  Right Ventricle: The right ventricular size is not well visualized. Right vetricular wall thickness was not well visualized. Right ventricular systolic function was not well visualized.  Left Atrium: Left atrial size was normal in size.  Right Atrium: Right atrial size was not well visualized.  Pericardium: Trivial pericardial effusion is present.  Mitral Valve: The mitral valve is grossly normal. No evidence of mitral valve regurgitation. No evidence of mitral valve stenosis.  Tricuspid Valve: The tricuspid valve is not well visualized. Tricuspid valve regurgitation is not demonstrated. No evidence of tricuspid stenosis.  Aortic Valve: Peak gradient of 33 mmHg despite mildly decrease LV stroke volume. DVI 0.26. Gradients were obtained ~ 1 minute after contrast administration with no contrast in the LV cavity. Consistent with paradoxical low flow low gradient aortic  stenosis. The aortic valve is calcified. There is moderate calcification of the aortic valve. There is moderate thickening of the aortic valve. There is moderate aortic valve annular calcification. Aortic valve regurgitation is mild. Severe aortic  stenosis is present. Aortic valve mean gradient measures 33.0 mmHg. Aortic valve peak gradient measures 53.6 mmHg. Aortic valve area, by VTI measures 0.80 cm.  Pulmonic Valve: The pulmonic valve was not well visualized. Pulmonic valve regurgitation is trivial. No evidence of pulmonic stenosis.  Aorta: The aortic root is normal in size and structure.  Venous: The inferior vena cava is dilated in size with greater than 50% respiratory variability, suggesting right atrial pressure of 8 mmHg.  IAS/Shunts: No atrial level shunt detected by color flow  Doppler.    LEFT VENTRICLE PLAX 2D LVIDd:         5.98 cm      Diastology LVIDs:         4.99 cm      LV e' medial:    4.62 cm/s LV PW:         0.85 cm      LV E/e' medial:  23.4 LV IVS:        0.91 cm      LV e' lateral:   6.16 cm/s LVOT diam:     2.00 cm      LV E/e' lateral: 17.5 LV SV:         68 LV SV Index:   30 LVOT Area:     3.14 cm   LV Volumes (MOD) LV vol d, MOD A2C: 164.0 ml LV vol d, MOD A4C: 186.0 ml LV vol s, MOD A2C: 69.9 ml LV vol s, MOD  A4C: 78.3 ml LV SV MOD A2C:     94.1 ml LV SV MOD A4C:     186.0 ml LV SV MOD BP:      101.4 ml  LEFT ATRIUM             Index LA diam:        3.70 cm 1.64 cm/m LA Vol (A2C):   48.6 ml 21.57 ml/m LA Vol (A4C):   57.9 ml 25.70 ml/m LA Biplane Vol: 56.5 ml 25.08 ml/m  AORTIC VALVE AV Area (Vmax):    0.94 cm AV Area (Vmean):   0.97 cm AV Area (VTI):     0.80 cm AV Vmax:           366.00 cm/s AV Vmean:          263.500 cm/s AV VTI:            0.840 m AV Peak Grad:      53.6 mmHg AV Mean Grad:      33.0 mmHg LVOT Vmax:         110.00 cm/s LVOT Vmean:        81.700 cm/s LVOT VTI:          0.215 m LVOT/AV VTI ratio: 0.26   AORTA Ao Root diam: 2.60 cm  MITRAL VALVE MV Area (PHT): 4.26 cm     SHUNTS MV Decel Time: 178 msec     Systemic VTI:  0.22 m MV E velocity: 108.00 cm/s  Systemic Diam: 2.00 cm MV A velocity: 90.30 cm/s MV E/A ratio:  1.20  Rudean Haskell MD Electronically signed by Rudean Haskell MD Signature Date/Time: 02/03/2022/10:25:21 AM       Final     *Note: Due to a large number of results and/or encounters for the requested time period, some results have not been displayed. A complete set of results can be found in Results Review.    Scheduled Meds:  DULoxetine  90 mg Oral Daily   fluticasone furoate-vilanterol  1 puff Inhalation Daily   And   umeclidinium bromide  1 puff Inhalation Daily   gabapentin  600 mg Oral BID   insulin aspart  0-6 Units Subcutaneous Q6H    lactulose  10 g Oral Daily   metoprolol tartrate  12.5 mg Oral BID   oseltamivir  75 mg Oral BID   pantoprazole (PROTONIX) IV  40 mg Intravenous Q12H   rosuvastatin  10 mg Oral QPM   sodium chloride flush  3 mL Intravenous Q12H   sodium chloride flush  3 mL Intravenous Q12H   tamsulosin  0.4 mg Oral QHS   topiramate  75 mg Oral TID   torsemide  20 mg Oral Daily   Continuous Infusions:  sodium chloride     PRN Meds:.sodium chloride, acetaminophen **OR** acetaminophen, HYDROcodone-acetaminophen, ipratropium-albuterol, metoprolol tartrate, sodium chloride, sodium chloride flush  Assessment & Plan: Exavier Lina is a 66 y.o. male patient with   Cardiogenic syncope versus combination of deconditioning and gait instability  Patient will be going home with PT and home health  Event monitor ordered  Moderate-severe aortic stenosis  CCTA suggests moderate AS, but valve area on TTE was 0.8 cm  Will do pre and post echo gradients with treadmill stress test in office to evaluate for symptoms regarding AS  EP ordered two week event monitor, if no events, will consider loop recorder  Patient to follow-up with me in 1-2 weeks post hospital dc   Coronary artery disease  of the native vessel without angina pectoris  (rSVG-OM1, rSVG-distal RCA, LIMA-distal LAD) on 09/07/2017    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  Bilateral antegrade vertebral artery flow.   Morbid obesity with obesity hypoventilation   Influenza A infection    Ok for discharge home Follow-up with me in office in 1-2 weeks    Floydene Flock,  DO, [facility] 02/16/2022, 3:52 PM Office: [phone] Fax: [phone] Pager: [phone]

## 2022-02-16 NOTE — Progress Notes (Addendum)
Occupational Therapy Treatment Patient Details Name: Jose [name] MRN: [id number] DOB: 1956/02/01 Today's Date: 02/16/2022   History of present illness Pt is a 66 y/o male presenting on 11/30 after fall. CT head with small subarachnoid hemorrhage at superior medial right convexity. Noted multiple hospitalizations for falls/syncope in 01/2022. Plan for diagnostic right and L heart cath, TAVR evaluation 12/1. Head CT 12/2- Hemorrhagic contusion in right frontal lobe expanded. + Flu 02/15/22. PMH includes: CAD s/p CABG, aortic valve stenosis, PVD, COPD, chronic respiratory failure on 4-6L O2, PE, T2DM, syncope.   OT comments  Pt progressing towards goals this session, completing in room ambulation and toilet transfer with min guard-min A. Pt needing max A for pericare, states at home he has a bidet. Pt with decreased cognition, needing increased cues for task sequencing during basic ADL. Pt presenting with impairments listed below, will follow acutely. Continue to recommend HHOT at d/c.   Recommendations for follow up therapy are one component of a multi-disciplinary discharge planning process, led by the attending physician.  Recommendations may be updated based on patient status, additional functional criteria and insurance authorization.    Follow Up Recommendations  Home health OT     Assistance Recommended at Discharge Intermittent Supervision/Assistance  Patient can return home with the following  Assist for transportation;Assistance with cooking/housework;Help with stairs or ramp for entrance;A little help with walking and/or transfers;A little help with bathing/dressing/bathroom;Direct supervision/assist for medications management;Direct supervision/assist for financial management   Equipment Recommendations  Tub/shower seat    Recommendations for Other Services PT consult    Precautions / Restrictions Precautions Precautions: Fall;Other (comment) Precaution Comments: Recurrent  syncope & falls, orthostatic Restrictions Weight Bearing Restrictions: No       Mobility Bed Mobility               General bed mobility comments: OOB in chair upon arrival and departure    Transfers Overall transfer level: Needs assistance Equipment used: Rolling walker (2 wheels) Transfers: Sit to/from Stand Sit to Stand: Min assist                 Balance Overall balance assessment: Needs assistance Sitting-balance support: Feet supported, No upper extremity supported Sitting balance-[name] Scale: Good     Standing balance support: During functional activity, Reliant on assistive device for balance Standing balance-[name] Scale: Poor Standing balance comment: Reliant on RW                           ADL either performed or assessed with clinical judgement   ADL Overall ADL's : Needs assistance/impaired                         Toilet Transfer: Minimal assistance;Rolling walker (2 wheels);Stand-pivot;BSC/3in1   Toileting- Architect and Hygiene: Maximal assistance Toileting - Clothing Manipulation Details (indicate cue type and reason): pericare     Functional mobility during ADLs: Minimal assistance;Rolling walker (2 wheels)      Extremity/Trunk Assessment Upper Extremity Assessment Upper Extremity Assessment: Generalized weakness   Lower Extremity Assessment Lower Extremity Assessment: Defer to PT evaluation        Vision       Perception Perception Perception: Not tested   Praxis Praxis Praxis: Not tested    Cognition Arousal/Alertness: Awake/alert Behavior During Therapy: WFL for tasks assessed/performed Overall Cognitive Status: No family/caregiver present to determine baseline cognitive functioning Area of Impairment: Problem solving, Orientation  Orientation Level: Disoriented to, Situation   Memory: Decreased short-term memory Following Commands: Follows one step commands  consistently   Awareness: Emergent Problem Solving: Slow processing, Requires verbal cues General Comments: increased cues for task sequencing during simple ADLs        Exercises      Shoulder Instructions       General Comments SpO2 89% at lowest on 3L O2 during session; pt wiht 2/4 DOE and quickly increasing to 90's once seated    Pertinent Vitals/ Pain       Pain Assessment Pain Assessment: No/denies pain  Home Living                                          Prior Functioning/Environment              Frequency  Min 2X/week        Progress Toward Goals  OT Goals(current goals can now be found in the care plan section)  Progress towards OT goals: Progressing toward goals  Acute Rehab OT Goals Patient Stated Goal: none stated OT Goal Formulation: With patient Time For Goal Achievement: 02/28/22 Potential to Achieve Goals: Good ADL Goals Pt Will Perform Upper Body Dressing: with modified independence;sitting Pt Will Perform Lower Body Dressing: with min guard assist;sitting/lateral leans;sit to/from stand Pt Will Transfer to Toilet: with modified independence;ambulating;regular height toilet Pt Will Perform Tub/Shower Transfer: Tub transfer;Shower transfer;with supervision;ambulating;shower seat;rolling walker  Plan Discharge plan remains appropriate;Frequency remains appropriate    Co-evaluation                 AM-PAC OT "6 Clicks" Daily Activity     Outcome Measure   Help from another person eating meals?: None Help from another person taking care of personal grooming?: A Little Help from another person toileting, which includes using toliet, bedpan, or urinal?: A Little Help from another person bathing (including washing, rinsing, drying)?: A Lot Help from another person to put on and taking off regular upper body clothing?: None Help from another person to put on and taking off regular lower body clothing?: A Lot 6 Click  Score: 18    End of Session Equipment Utilized During Treatment: Gait belt;Rolling walker (2 wheels);Oxygen  OT Visit Diagnosis: Unsteadiness on feet (R26.81);Other abnormalities of gait and mobility (R26.89);Muscle weakness (generalized) (M62.81)   Activity Tolerance Patient tolerated treatment well   Patient Left in chair;with call bell/phone within reach;with chair alarm set   Nurse Communication Mobility status        Time: 9381-8299 OT Time Calculation (min): 25 min  Charges: OT General Charges $OT Visit: 1 Visit OT Treatments $Self Care/Home Management : 23-37 mins  Carver Fila, OTD, OTR/L SecureChat Preferred Acute Rehab [phone]   Carver Fila Koonce 02/16/2022, 2:46 PM

## 2022-02-16 NOTE — Consult Note (Addendum)
ELECTROPHYSIOLOGY CONSULT NOTE    Patient ID: Jose [name] MRN: [id number], DOB/AGE: 66/03/1955 66 y.o.  Admit date: 02/12/2022 Date of Consult: 02/16/2022  Primary Physician: Ephriam JenkinsFowler, Walter E, MD Primary Cardiologist: None  Electrophysiologist: None   Referring Provider: Dr. Lanae Boastamesh Kc  Patient Profile: Jose [name] is a 66 y.o. male with a history of CAD s/p CABG, hyperlipidemia, aortic valve stenosis, PVD, antiphospholipid syndrome on chronic anticoagulation (Coumadin), COPD (on home Os 4-6L), PE (2011), T2DM, vertebrobasilar insufficiency, OSA, recurrent syncope needing multiple ER visits and hospitalizations, who is being seen today for the evaluation of syncope at the request of Dr. Jonathon BellowsKc.  HPI:  Jose [name] is a 66 y.o. male with above PMH who sustained a fall at home 11/30. He has been [facility] for [facility] 2 earlier in November. CT scan of head noted a small area of subarachnoid hemorrhage, labs normal. Neurosx recommended serial CT scans. He underwent LHC on 12/1, did not have any lesions to intervene upon.   On the morning of 12/3, patient had increased confusion, repeat CT scan showed no change from prior.   Potassium3.9 (12/04 0136)   Creatinine, ser  0.70 (12/04 0136) PLT  179 (12/04 0136) HGB  8.6* (12/04 0136) WBC 8.3 (12/04 0136)  .    He has undergone Neurology evaluation including tilt test, EEG, MRIs, and CTAs in the past without identified syncope cause. 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   He has a prodrome of "wobbly legs", feels like he is going to pass out. He sometimes passes out a couple times a day, sometimes will go weeks without an episode. His syncope episodes only happen when he is standing up, never while lying down or sitting. These episodes often happen in the kitchen in the late afternoon or evenings. He sometimes has nausea afterwards. He also often has a HA after episodes. He only  calls EMS or goes to hospital for evaluation after syncope where he has injury. He has a calendar on phone where he has kept track of how often he syncopizes.  Today he is feeling well, able to sit in chair with O2 via [place]. He has not had any syncope episodes or "wobbly legs" since he has been in the hospital.    He denies chest pain, palpitations, dyspnea, PND, orthopnea, nausea, vomiting, dizziness, syncope, edema, weight gain, or early satiety.  Past Medical History:  Diagnosis Date   Antiphospholipid antibody positive    Antiphospholipid antibody syndrome (HCC)    CAD (coronary artery disease)    CAD S/P percutaneous coronary angioplasty '98, '04, Feb 2017   [facility]   Carotid arterial disease (HCC) 10/2015   moderate bilateral   COPD (chronic obstructive pulmonary disease) (HCC)    Coronary artery disease    DM (diabetes mellitus) (HCC)    HTN (hypertension)    Hypertension    Non-insulin treated type 2 diabetes mellitus (HCC)    OSA (obstructive sleep apnea)    Pulmonary emboli (HCC) 2011   RBBB    Sleep apnea    Vertebrobasilar artery insufficiency      Surgical History:  Past Surgical History:  Procedure Laterality Date   APPENDECTOMY     APPLICATION OF WOUND VAC Left 03/18/2019   Procedure: Application Of Wound Vac;  Surgeon: Nadara Mustarduda, Marcus V, MD;  [facility];  Service: Orthopedics;  Laterality: Left;   BACK SURGERY     BACK SURGERY  '89, '  06   COLONOSCOPY     CORONARY ANGIOPLASTY WITH STENT PLACEMENT     CORONARY ANGIOPLASTY WITH STENT PLACEMENT  '98, '04, Feb 2017   I & D EXTREMITY Left 03/15/2019   Procedure: DEBRIDEMENT LEFT THIGH;  Surgeon: Newt Minion, MD;  Location: [place];  Service: Orthopedics;  Laterality: Left;   INCISION AND DRAINAGE OF WOUND Left 03/18/2019   Procedure: LEFT THIGH DEBRIDEMENT;  Surgeon: Newt Minion, MD;  Location: [place];  Service: Orthopedics;  Laterality: Left;   RIGHT/LEFT HEART CATH AND CORONARY/GRAFT ANGIOGRAPHY N/A 02/13/2022    Procedure: RIGHT/LEFT HEART CATH AND CORONARY/GRAFT ANGIOGRAPHY;  Surgeon: Adrian Prows, MD;  Location: [place] [facility];  Service: Cardiovascular;  Laterality: N/A;     Medications Prior to Admission  Medication Sig Dispense Refill Last Dose   acetaminophen (TYLENOL) 500 MG tablet Take 1,000 mg by mouth every 6 (six) hours as needed for mild pain.   unknown   albuterol (PROVENTIL) (5 MG/ML) 0.5% nebulizer solution Take 0.5 mLs (2.5 mg total) by nebulization every 6 (six) hours as needed for wheezing or shortness of breath. 20 mL 12 unknown   albuterol (VENTOLIN HFA) 108 (90 Base) MCG/ACT inhaler Inhale 2 puffs into the lungs every 6 (six) hours as needed for wheezing or shortness of breath.   unknown   aspirin EC 81 MG EC tablet Take 1 tablet (81 mg total) by mouth daily. Swallow whole. 30 tablet 11 Past Week   DULoxetine (CYMBALTA) 60 MG capsule Take 60 mg by mouth daily. Take one capsule (60 mg) along with 30 mg capsule to equal 90 mg daily   Past Week   DULoxetine HCl 30 MG CSDR Take 30 mg by mouth daily.   Past Week   Fluticasone-Umeclidin-Vilant (TRELEGY ELLIPTA IN) Inhale 1 puff into the lungs daily.   Past Week   gabapentin (NEURONTIN) 600 MG tablet Take 600 mg by mouth 2 (two) times daily.   02/11/2022   glimepiride (AMARYL) 4 MG tablet Take 4 mg by mouth 2 (two) times daily.   02/11/2022   ipratropium-albuterol (DUONEB) 0.5-2.5 (3) MG/3ML SOLN Take 3 mLs by nebulization every 4 (four) hours as needed (For shortness of breath or wheezing).   unknown   linezolid (ZYVOX) 600 MG tablet Take 1 tablet (600 mg total) by mouth 2 (two) times daily. 6 tablet 0 02/11/2022   metoprolol tartrate (LOPRESSOR) 25 MG tablet Take 1 tab daily for for 4 days then 1 tab every other day for 4 days then stop (Patient taking differently: Take 12.5 mg by mouth 2 (two) times daily.)   02/11/2022 at 0500   nitroGLYCERIN (NITROSTAT) 0.4 MG SL tablet Place 0.4 mg under the tongue every 5 (five) minutes as needed for  chest pain.   1 unknown   OXYGEN Inhale 4-6 L into the lungs daily.   02/11/2022   pantoprazole (PROTONIX) 40 MG tablet Take 40 mg by mouth 2 (two) times daily.   02/11/2022   rosuvastatin (CRESTOR) 10 MG tablet Take 10 mg by mouth every evening.  11 Past Week   tamsulosin (FLOMAX) 0.4 MG CAPS capsule Take 1 capsule (0.4 mg total) by mouth at bedtime. 30 capsule 0 02/11/2022   topiramate (TOPAMAX) 50 MG tablet Take 1.5 tablets (75 mg total) by mouth 2 (two) times daily. (Patient taking differently: Take 75 mg by mouth 3 (three) times daily.) 45 tablet 1 02/11/2022   torsemide (DEMADEX) 20 MG tablet Take 20 mg by mouth daily.  02/11/2022   warfarin (COUMADIN) 5 MG tablet Take 5 mg by mouth every evening.   02/10/2022 at 2200   enoxaparin (LOVENOX) 120 MG/0.8ML injection Inject 0.74 mLs (111 mg total) into the skin every 12 (twelve) hours for 4 days. (Patient not taking: Reported on 02/12/2022) 5.92 mL 0 Completed Course   senna-docusate (SENOKOT-S) 8.6-50 MG tablet Take 1 tablet by mouth 2 (two) times daily as needed for moderate constipation. 60 tablet 0 never picked up    Inpatient Medications:   DULoxetine  90 mg Oral Daily   fluticasone furoate-vilanterol  1 puff Inhalation Daily   And   umeclidinium bromide  1 puff Inhalation Daily   gabapentin  600 mg Oral BID   insulin aspart  0-6 Units Subcutaneous Q6H   lactulose  10 g Oral Daily   metoprolol tartrate  12.5 mg Oral BID   oseltamivir  75 mg Oral BID   pantoprazole (PROTONIX) IV  40 mg Intravenous Q12H   rosuvastatin  10 mg Oral QPM   sodium chloride flush  3 mL Intravenous Q12H   sodium chloride flush  3 mL Intravenous Q12H   tamsulosin  0.4 mg Oral QHS   topiramate  75 mg Oral TID   torsemide  20 mg Oral Daily    Allergies:  Allergies  Allergen Reactions   Melatonin Anxiety   Altace [Ramipril] Cough    Social History   Socioeconomic History   Marital status: Married    Spouse name: Not on file   Number of children:  Not on file   Years of education: Not on file   Highest education level: Not on file  Occupational History   Not on file  Tobacco Use   Smoking status: Former    Packs/day: 0.50    Years: 40.00    Total pack years: 20.00    Types: Cigarettes    Quit date: 01/15/2019    Years since quitting: 3.0   Smokeless tobacco: Never   Tobacco comments:    has started smoking again, willing to quit.  Vaping Use   Vaping Use: Never used  Substance and Sexual Activity   Alcohol use: No   Drug use: No   Sexual activity: Yes  Other Topics Concern   Not on file  Social History Narrative   ** Merged History Encounter **       Social Determinants of Health   Financial Resource Strain: Not on file  Food Insecurity: Not on file  Transportation Needs: Not on file  Physical Activity: Not on file  Stress: Not on file  Social Connections: Not on file  Intimate Partner Violence: Not on file     Family History  Problem Relation Age of Onset   CAD Father 55     Review of Systems: All other systems reviewed and are otherwise negative except as noted above.  Physical Exam: Vitals:   02/15/22 1658 02/15/22 1933 02/15/22 2251 02/16/22 0400  BP:   122/63 (!) 137/52  Pulse: 83 85  79  Resp:  (!) 31 18 20   Temp:   100 F (37.8 C) 99.2 F (37.3 C)  TempSrc:   Oral Oral  SpO2: 100%  100% 98%  Weight:      Height:        GEN- The patient is well appearing, obese alert and oriented x 3 today.  HEENT: normocephalic, atraumatic; sclera clear, conjunctiva pink; hearing intact; oropharynx clear; neck supple, poor dentition Lungs- Clear to ausculation bilaterally,  diminished in bases, normal work of breathing.  No wheezes, rales, rhonchi.  Heart- Regular rate and rhythm, no murmurs, rubs or gallops GI- soft, non-tender, obese, but easily compressible, bowel sounds present Extremities- no clubbing, cyanosis. 1+ edema BL to knees; DP/PT/radial pulses 2+ bilaterally MS- no significant deformity or  atrophy Skin- warm and dry, no rash or lesion Psych- euthymic mood, full affect Neuro- strength and sensation are intact  Labs:   Lab Results  Component Value Date   WBC 8.3 02/16/2022   HGB 8.6 (L) 02/16/2022   HCT 29.2 (L) 02/16/2022   MCV 93.9 02/16/2022   PLT 179 02/16/2022    Recent Labs  Lab 02/16/22 0136  NA 140  K 3.9  CL 103  CO2 29  BUN 10  CREATININE 0.70  CALCIUM 8.7*  PROT 7.1  BILITOT 0.5  ALKPHOS 69  ALT 26  AST 30  GLUCOSE 136*      Radiology/Studies: CT HEAD WO CONTRAST (5MM)  Result Date: 02/16/2022 CLINICAL DATA:  TBI, follow-up ICH EXAM: CT HEAD WITHOUT CONTRAST TECHNIQUE: Contiguous axial images were obtained from the base of the skull through the vertex without intravenous contrast. RADIATION DOSE REDUCTION: This exam was performed according to the departmental dose-optimization program which includes automated exposure control, adjustment of the mA and/or kV according to patient size and/or use of iterative reconstruction technique. COMPARISON:  Head CT from yesterday FINDINGS: Brain: No change in the parenchymal hemorrhage in the high and anterior right frontal lobe with local subarachnoid spread and mild edema in the adjacent brain. The hematoma measures up to 2.2 cm. No mass effect or new hemorrhage. No evidence of infarct or hydrocephalus. Vascular: No hyperdense vessel or unexpected calcification. Skull: Normal. Negative for fracture or focal lesion. Sinuses/Orbits: No acute finding. IMPRESSION: No progression of the right frontal hemorrhage Electronically Signed   By: Jorje Guild M.D.   On: 02/16/2022 05:27   DG Chest Port 1 View  Result Date: 02/15/2022 CLINICAL DATA:  Shortness of breath EXAM: PORTABLE CHEST 1 VIEW COMPARISON:  February 12, 2022 FINDINGS: The heart size and mediastinal contours are stable. The heart size is enlarged. Prior median sternotomy and CABG. Both lungs are clear. The visualized skeletal structures are unremarkable.  IMPRESSION: No active disease. Electronically Signed   By: Abelardo Diesel M.D.   On: 02/15/2022 10:02   CT HEAD WO CONTRAST (5MM)  Result Date: 02/15/2022 CLINICAL DATA:  Change in mental status EXAM: CT HEAD WITHOUT CONTRAST TECHNIQUE: Contiguous axial images were obtained from the base of the skull through the vertex without intravenous contrast. RADIATION DOSE REDUCTION: This exam was performed according to the departmental dose-optimization program which includes automated exposure control, adjustment of the mA and/or kV according to patient size and/or use of iterative reconstruction technique. COMPARISON:  Yesterday FINDINGS: Brain: High and anterior right frontal hematoma with adjacent edema and small volume subarachnoid hemorrhage, parenchymal component measuring 17 mm. No interval progression. No gray matter infarct, hydrocephalus, or shift. Vascular: No hyperdense vessel or unexpected calcification. Skull: Normal. Negative for fracture or focal lesion. Sinuses/Orbits: No acute finding. IMPRESSION: Unchanged right frontal lobe hemorrhage with local subarachnoid spread and edema. No new or progressive finding. Electronically Signed   By: Jorje Guild M.D.   On: 02/15/2022 09:34   CT HEAD WO CONTRAST (5MM)  Result Date: 02/14/2022 CLINICAL DATA:  Subarachnoid hemorrhage follow-up. EXAM: CT HEAD WITHOUT CONTRAST TECHNIQUE: Contiguous axial images were obtained from the base of the skull through the vertex without  intravenous contrast. RADIATION DOSE REDUCTION: This exam was performed according to the departmental dose-optimization program which includes automated exposure control, adjustment of the mA and/or kV according to patient size and/or use of iterative reconstruction technique. COMPARISON:  CT head without contrast 02/12/2022 FINDINGS: Brain: Previous area of hemorrhagic contusion in the anterior right frontal lobe has expanded. The parenchymal hemorrhage now measures 2.3 x 1.7 x 1.4 cm with  increased surrounding edema. Subarachnoid and subdural hemorrhage along the falx is similar the prior exam. No distal hemorrhage is present. Mild atrophy and white matter changes are stable. The ventricles are of normal size. The brainstem and cerebellum are within normal limits. Vascular: Atherosclerotic calcifications are present within the cavernous internal carotid arteries bilaterally. No hyperdense vessel is present. Skull: Calvarium is intact. No focal lytic or blastic lesions are present. No significant extracranial soft tissue lesion is present. Sinuses/Orbits: The paranasal sinuses and mastoid air cells are clear. The globes and orbits are within normal limits. IMPRESSION: 1. Previous area of hemorrhagic contusion in the anterior right frontal lobe has expanded. The parenchymal hemorrhage now measures 2.3 x 1.7 x 1.4 cm with increased surrounding edema. 2. Subarachnoid and subdural hemorrhage along the falx is similar the prior exam. 3. Stable atrophy and white matter disease. Critical Value/emergent results were called by telephone at the time of interpretation on 02/14/2022 at 5:44 pm to provider [facility] , who verbally acknowledged these results. Electronically Signed   By: San Morelle M.D.   On: 02/14/2022 17:44   CT CORONARY MORPH W/CTA COR W/SCORE W/CA W/CM &/OR WO/CM  Addendum Date: 02/13/2022   ADDENDUM REPORT: 02/13/2022 17:53 CLINICAL DATA:  62M with possible severe aortic stenosis being evaluated for a TAVR procedure. EXAM: Cardiac TAVR CT TECHNIQUE: The patient was scanned on a [employer]. A 120 kV retrospective scan was triggered in the descending thoracic aorta at 111 HU's. Gantry rotation speed was 250 msecs and collimation was .6 mm. No beta blockade or nitro were given. The 3D data set was reconstructed in 5% intervals of the R-R cycle. Systolic and diastolic phases were analyzed on a dedicated work station using MPR, MIP and VRT modes. The patient received 100 cc  of contrast. FINDINGS: Aortic Root: Aortic valve: Trileaflet Aortic valve calcium score: 1194 Aortic annulus: Diameter: 92mm x 86mm Perimeter: 22mm Area: 417 mm^2 Calcifications: No calcifications Coronary height: Min Left - 69mm,  Min Right - 32mm Sinotubular height: Left cusp - 61mm; Right cusp - 61mm; Noncoronary cusp - 85mm LVOT (as measured 3 mm below the annulus): Diameter: 63mm x 32mm Area: 403 mm^2 Calcifications: No calcifications Aortic sinus width: Left cusp - 80mm; Right cusp - 14mm; Noncoronary cusp -68mm Sinotubular junction width: 72mm x 33mm Optimum Fluoroscopic Angle for Delivery: LAO 17 CAU 8 Cardiac: Right atrium: Normal size Right ventricle: Moderate dilatation Pulmonary arteries: Normal size Pulmonary veins: Normal configuration Left atrium: Mild enlargement Left ventricle: Moderate dilatation Pericardium: Normal thickness Coronary arteries: S/p CABG with patent LIMA-LAD, SVG-RCA, and SVG-OM1 IMPRESSION: 1. Trileaflet aortic valve with moderate calcifications (AV calcium score 1194). AV calcium score and visual evaluation of valve opening suggest more likely moderate aortic stenosis 2. Aortic annulus measures 31mm x 24mm in diameter with perimeter 13mm and area 417 mm^2. No annular or LVOT calcifications. Annular measurements are suitable for delivery of 53mm Edwards Sapien 3 valve. 3. Sufficient coronary to annulus distance. 4. Optimum Fluoroscopic Angle for Delivery:  LAO 17 CAU 8 5. S/p CABG with patent LIMA-LAD, SVG-RCA,  and SVG-OM1 Electronically Signed   By: Oswaldo Milian M.D.   On: 02/13/2022 17:53   Result Date: 02/13/2022 EXAM: OVER-READ INTERPRETATION  CT CHEST The following report is a limited chest CT over-read performed by radiologist Dr. Salvatore Marvel of [facility], [place] on 02/13/2022. This over-read does not include interpretation of cardiac or coronary anatomy or pathology. The cardiac CTA interpretation by the cardiologist is attached. COMPARISON:  02/12/2022 chest  CT angiogram. FINDINGS: Please see the separate concurrent chest CT angiogram report for details. IMPRESSION: Please see the separate concurrent chest CT angiogram report for details. Electronically Signed: By: Ilona Sorrel M.D. On: 02/13/2022 10:46   CARDIAC CATHETERIZATION  Result Date: 02/13/2022 Images from the original result were not included. Right left Heart Catheterization 02/13/22: RA 8/7, mean 6 mmHg. RV 47/1, EDP 10 mmHg; PA 43/14, mean 25 mmHg.  PA saturation 59%.  Aortic saturation 98%.  PW 8/8, mean 7 mmHg. QP/QS 1.00.  CO 5.91/CI 2.73 LV: 131/3, EDP 19 mmHg.  Ao 1 and 14/42, mean 67 mmHg.  Peak to peak pressure gradient 23 and mean pressure gradient 30 mmHg.  Calculated aortic valve area 1.22 cm. LV gram not performed. LM: Large-caliber vessel, normal. LAD: Gives origin to a very large D1 and D2, has ostial 20 to 30% stenosis in the diagonals.  LAD itself is very mild disease.  Distal LAD has come to the feeling from the LIMA to LAD. LIMA to LAD: Widely patent. LCx: Proximal segment has a 40 to 50% stenosis.  Distal CX is occluded. SVG to OM1-distal CX jump graft: Widely patent. Impression: Widely patent native vessels on the left system, no significant stenosis was identified however has been grafted.  Only lesion that was felt to be significant was distal circumflex which is occluded.  Right coronary artery has a prior stent that is occluded, very large RCA which is now bypassed and graft retrogradely fills the native RCA all the way back to the stent at the ostium.  70 mL contrast utilized. Adrian Prows, MD, [facility] 02/13/2022, 4:22 PM Office: [phone] Fax: [phone] Pager: [phone]   CT ANGIO ABDOMEN PELVIS  W &/OR WO CONTRAST  Result Date: 02/13/2022 CLINICAL DATA:  Inpatient. Aortic valve replacement (TAVR), pre-op eval aortic stenosis; Aortic valve replacement (TAVR), pre-op eval EXAM: CT ANGIOGRAPHY CHEST, ABDOMEN AND PELVIS TECHNIQUE: Multidetector CT imaging through the chest,  abdomen and pelvis was performed using the standard protocol during bolus administration of intravenous contrast. Multiplanar reconstructed images and MIPs were obtained and reviewed to evaluate the vascular anatomy. RADIATION DOSE REDUCTION: This exam was performed according to the departmental dose-optimization program which includes automated exposure control, adjustment of the mA and/or kV according to patient size and/or use of iterative reconstruction technique. CONTRAST:  139mL OMNIPAQUE IOHEXOL 350 MG/ML SOLN COMPARISON:  02/12/2022 chest CT angiogram and CT abdomen/pelvis. FINDINGS: CTA CHEST FINDINGS Cardiovascular: Borderline mild cardiomegaly. Diffuse thickening and coarse calcification of the aortic valve. No significant pericardial effusion/thickening. Three-vessel coronary atherosclerosis status post CABG. Atherosclerotic nonaneurysmal thoracic aorta. Normal caliber pulmonary arteries. No central pulmonary emboli. Mediastinum/Nodes: No significant thyroid nodules. Unremarkable esophagus. No axillary adenopathy. Mildly enlarged 1.0 cm right paratracheal node (series 7/image 41), decreased from 1.3 cm on 02/02/2022 CT. No additional pathologically enlarged mediastinal nodes. No hilar adenopathy. Lungs/Pleura: No pneumothorax. No pleural effusion. Patchy tree-in-bud opacities and nodular 1.5 cm focus of consolidation in the right lower lobe (series 8/image 131), new from 02/02/2022 CT and not appreciably changed from 02/12/2022 CT. Tiny 0.2  cm apical right upper lobe nodule (series 8/image 55), stable since 07/13/2020 CT, considered benign. Musculoskeletal: No aggressive appearing focal osseous lesions. Intact sternotomy wires. Moderate thoracic spondylosis. CTA ABDOMEN AND PELVIS FINDINGS Hepatobiliary: Normal liver with no liver mass. Normal gallbladder with no radiopaque cholelithiasis. No biliary ductal dilatation. Pancreas: Normal, with no mass or duct dilation. Spleen: Normal size. No mass.  Adrenals/Urinary Tract: Normal adrenals. No hydronephrosis. Nonobstructing 11 mm interpolar right and 3 mm interpolar left renal stones. No contour deforming renal masses. Normal bladder. Stomach/Bowel: Normal non-distended stomach. Normal caliber small bowel with no small bowel wall thickening. Appendectomy. Mild scattered colonic diverticulosis with no large bowel wall thickening or significant pericolonic fat stranding. Vascular/Lymphatic: Atherosclerotic nonaneurysmal abdominal aorta. Patent renal, portal and splenic veins. Mildly enlarged 1.3 cm right external iliac lymph node (series 7/image 192) and mildly enlarged 1.0 cm left external iliac lymph node (series 7/image 183), both stable since 02/02/2022 CT. No pathologically enlarged abdominal lymph nodes. Reproductive: Normal size prostate. Other: No pneumoperitoneum, ascites or focal fluid collection. Medial right gluteal subcutaneous hyperdense focus measuring 3.7 cm thickness (series 7/image 178), unchanged since 02/02/2022 CT, new from 01/20/2020 CT, most compatible with a subcutaneous hematoma. Musculoskeletal: No aggressive appearing focal osseous lesions. Moderate lumbar spondylosis. VASCULAR MEASUREMENTS PERTINENT TO TAVR: AORTA: Minimal Aortic Diameter-10.5 x 6.7 mm Severity of Aortic Calcification-moderate-to-severe RIGHT PELVIS: Right Common Iliac Artery - Minimal Diameter-7.0 x 5.2 mm Tortuosity-mild-to-moderate Calcification-severe Right External Iliac Artery - Minimal Diameter-6.6 x 5.1 mm Tortuosity-mild to moderate Calcification-moderate to severe Right Common Femoral Artery - Minimal Diameter-7.3 x 4.7 mm Tortuosity-mild Calcification-moderate to severe LEFT PELVIS: Left Common Iliac Artery - Minimal Diameter-7.3 x 6.2 mm (measured on series 6/image 459 as [employer] had difficulty with the center line at this site) Tortuosity-moderate Calcification-severe Left External Iliac Artery - Minimal Diameter-6.2 x 6.1 mm Tortuosity-moderate  Calcification-moderate Left Common Femoral Artery - Minimal Diameter-7.2 x 6.0 mm Tortuosity-mild Calcification-moderate Review of the MIP images confirms the above findings. IMPRESSION: 1. Vascular findings and measurements pertinent to potential TAVR procedure, as detailed. 2. Diffuse thickening and coarse calcification of the aortic valve, compatible with the reported history of aortic stenosis. 3. Borderline mild cardiomegaly. 4. Patchy tree-in-bud opacities and nodular 1.5 cm focus of consolidation in the right lower lobe, new from 02/02/2022 CT and not appreciably changed from 02/12/2022 CT, most compatible with a nonspecific mild bronchopneumonia such as due to aspiration. Recommend attention on follow-up chest CT in 3 months. 5. Nonspecific mild mediastinal and bilateral external iliac lymphadenopathy, stable since 02/02/2022 CT. Recommend attention on follow-up CT chest, abdomen and pelvis with oral and IV contrast in 3 months. 6. Stable subcutaneous hematoma in the medial right gluteal region. 7. Nonobstructing bilateral nephrolithiasis. 8. Mild scattered colonic diverticulosis. 9.  Aortic Atherosclerosis (ICD10-I70.0). Electronically Signed   By: Delbert Phenix M.D.   On: 02/13/2022 12:14   CT ANGIO CHEST AORTA W/CM & OR WO/CM  Result Date: 02/13/2022 CLINICAL DATA:  Inpatient. Aortic valve replacement (TAVR), pre-op eval aortic stenosis; Aortic valve replacement (TAVR), pre-op eval EXAM: CT ANGIOGRAPHY CHEST, ABDOMEN AND PELVIS TECHNIQUE: Multidetector CT imaging through the chest, abdomen and pelvis was performed using the standard protocol during bolus administration of intravenous contrast. Multiplanar reconstructed images and MIPs were obtained and reviewed to evaluate the vascular anatomy. RADIATION DOSE REDUCTION: This exam was performed according to the departmental dose-optimization program which includes automated exposure control, adjustment of the mA and/or kV according to patient size  and/or use of iterative reconstruction technique.  CONTRAST:  143mL OMNIPAQUE IOHEXOL 350 MG/ML SOLN COMPARISON:  02/12/2022 chest CT angiogram and CT abdomen/pelvis. FINDINGS: CTA CHEST FINDINGS Cardiovascular: Borderline mild cardiomegaly. Diffuse thickening and coarse calcification of the aortic valve. No significant pericardial effusion/thickening. Three-vessel coronary atherosclerosis status post CABG. Atherosclerotic nonaneurysmal thoracic aorta. Normal caliber pulmonary arteries. No central pulmonary emboli. Mediastinum/Nodes: No significant thyroid nodules. Unremarkable esophagus. No axillary adenopathy. Mildly enlarged 1.0 cm right paratracheal node (series 7/image 41), decreased from 1.3 cm on 02/02/2022 CT. No additional pathologically enlarged mediastinal nodes. No hilar adenopathy. Lungs/Pleura: No pneumothorax. No pleural effusion. Patchy tree-in-bud opacities and nodular 1.5 cm focus of consolidation in the right lower lobe (series 8/image 131), new from 02/02/2022 CT and not appreciably changed from 02/12/2022 CT. Tiny 0.2 cm apical right upper lobe nodule (series 8/image 55), stable since 07/13/2020 CT, considered benign. Musculoskeletal: No aggressive appearing focal osseous lesions. Intact sternotomy wires. Moderate thoracic spondylosis. CTA ABDOMEN AND PELVIS FINDINGS Hepatobiliary: Normal liver with no liver mass. Normal gallbladder with no radiopaque cholelithiasis. No biliary ductal dilatation. Pancreas: Normal, with no mass or duct dilation. Spleen: Normal size. No mass. Adrenals/Urinary Tract: Normal adrenals. No hydronephrosis. Nonobstructing 11 mm interpolar right and 3 mm interpolar left renal stones. No contour deforming renal masses. Normal bladder. Stomach/Bowel: Normal non-distended stomach. Normal caliber small bowel with no small bowel wall thickening. Appendectomy. Mild scattered colonic diverticulosis with no large bowel wall thickening or significant pericolonic fat stranding.  Vascular/Lymphatic: Atherosclerotic nonaneurysmal abdominal aorta. Patent renal, portal and splenic veins. Mildly enlarged 1.3 cm right external iliac lymph node (series 7/image 192) and mildly enlarged 1.0 cm left external iliac lymph node (series 7/image 183), both stable since 02/02/2022 CT. No pathologically enlarged abdominal lymph nodes. Reproductive: Normal size prostate. Other: No pneumoperitoneum, ascites or focal fluid collection. Medial right gluteal subcutaneous hyperdense focus measuring 3.7 cm thickness (series 7/image 178), unchanged since 02/02/2022 CT, new from 01/20/2020 CT, most compatible with a subcutaneous hematoma. Musculoskeletal: No aggressive appearing focal osseous lesions. Moderate lumbar spondylosis. VASCULAR MEASUREMENTS PERTINENT TO TAVR: AORTA: Minimal Aortic Diameter-10.5 x 6.7 mm Severity of Aortic Calcification-moderate-to-severe RIGHT PELVIS: Right Common Iliac Artery - Minimal Diameter-7.0 x 5.2 mm Tortuosity-mild-to-moderate Calcification-severe Right External Iliac Artery - Minimal Diameter-6.6 x 5.1 mm Tortuosity-mild to moderate Calcification-moderate to severe Right Common Femoral Artery - Minimal Diameter-7.3 x 4.7 mm Tortuosity-mild Calcification-moderate to severe LEFT PELVIS: Left Common Iliac Artery - Minimal Diameter-7.3 x 6.2 mm (measured on series 6/image 459 as [employer] had difficulty with the center line at this site) Tortuosity-moderate Calcification-severe Left External Iliac Artery - Minimal Diameter-6.2 x 6.1 mm Tortuosity-moderate Calcification-moderate Left Common Femoral Artery - Minimal Diameter-7.2 x 6.0 mm Tortuosity-mild Calcification-moderate Review of the MIP images confirms the above findings. IMPRESSION: 1. Vascular findings and measurements pertinent to potential TAVR procedure, as detailed. 2. Diffuse thickening and coarse calcification of the aortic valve, compatible with the reported history of aortic stenosis. 3. Borderline mild cardiomegaly.  4. Patchy tree-in-bud opacities and nodular 1.5 cm focus of consolidation in the right lower lobe, new from 02/02/2022 CT and not appreciably changed from 02/12/2022 CT, most compatible with a nonspecific mild bronchopneumonia such as due to aspiration. Recommend attention on follow-up chest CT in 3 months. 5. Nonspecific mild mediastinal and bilateral external iliac lymphadenopathy, stable since 02/02/2022 CT. Recommend attention on follow-up CT chest, abdomen and pelvis with oral and IV contrast in 3 months. 6. Stable subcutaneous hematoma in the medial right gluteal region. 7. Nonobstructing bilateral nephrolithiasis. 8. Mild scattered colonic diverticulosis.  9.  Aortic Atherosclerosis (ICD10-I70.0). Electronically Signed   By: Ilona Sorrel M.D.   On: 02/13/2022 12:14   CT HEAD WO CONTRAST (5MM)  Result Date: 02/12/2022 CLINICAL DATA:  Fall EXAM: CT HEAD WITHOUT CONTRAST TECHNIQUE: Contiguous axial images were obtained from the base of the skull through the vertex without intravenous contrast. RADIATION DOSE REDUCTION: This exam was performed according to the departmental dose-optimization program which includes automated exposure control, adjustment of the mA and/or kV according to patient size and/or use of iterative reconstruction technique. COMPARISON:  CT head obtained earlier the same day. FINDINGS: Brain: The right parafalcine extra-axial hematoma is slightly increased in size measuring up to 5 mm in thickness in the coronal plane, previously measured up to 4 mm. The hematoma now measures up to 1.5 cm in the craniocaudad dimension in the coronal plane, previously measured 0.9 cm. There is no mass effect on the underlying brain parenchyma. There is no other acute intracranial hemorrhage. There is no acute territorial infarct. The ventricles are stable in size. Gray-white differentiation is preserved There is no solid mass lesion.  There is no midline shift. Vascular: There is calcification of the  bilateral carotid siphons and vertebral arteries. Skull: Normal. Negative for fracture or focal lesion. Sinuses/Orbits: The paranasal sinuses are clear. The globes and orbits are unremarkable. Other: None. IMPRESSION: Slight interval increase in size of the small right parafalcine extra-axial hematoma without mass effect or midline shift. Electronically Signed   By: Valetta Mole M.D.   On: 02/12/2022 08:17   CT ABDOMEN PELVIS W CONTRAST  Result Date: 02/12/2022 CLINICAL DATA:  Syncope. EXAM: CT ABDOMEN AND PELVIS WITH CONTRAST TECHNIQUE: Multidetector CT imaging of the abdomen and pelvis was performed using the standard protocol following bolus administration of intravenous contrast. RADIATION DOSE REDUCTION: This exam was performed according to the departmental dose-optimization program which includes automated exposure control, adjustment of the mA and/or kV according to patient size and/or use of iterative reconstruction technique. CONTRAST:  115mL OMNIPAQUE IOHEXOL 350 MG/ML SOLN COMPARISON:  February 02, 2022 FINDINGS: Lower chest: Multiple sternal wires are noted. Mild, posterior right basilar atelectasis and/or early infiltrate is seen. Hepatobiliary: No focal liver abnormality is seen. No gallstones, gallbladder wall thickening, or biliary dilatation. Pancreas: Unremarkable. No pancreatic ductal dilatation or surrounding inflammatory changes. Spleen: Normal in size without focal abnormality. Adrenals/Urinary Tract: Adrenal glands are unremarkable. Kidneys are normal in size, without obstructing renal calculi, focal lesion, or hydronephrosis. A 12 mm nonobstructing renal calculus is seen within the mid right kidney. A 2 mm nonobstructing renal calculus is also seen within the lower pole of the left kidney. The urinary bladder is markedly distended and is unremarkable. Stomach/Bowel: Stomach is within normal limits. The appendix is surgically absent. No evidence of bowel wall thickening, distention, or  inflammatory changes. Noninflamed diverticula are seen throughout the large bowel. Vascular/Lymphatic: Aortic atherosclerosis. No enlarged abdominal or pelvic lymph nodes. Reproductive: The prostate gland is not clearly identified. Other: No abdominal wall hernia or abnormality. No abdominopelvic ascites. Musculoskeletal: Acute, nondisplaced fracture is seen within the anterior seventh left rib. Multilevel degenerative changes seen throughout the lumbar spine. IMPRESSION: 1. Mild, posterior right basilar atelectasis and/or early infiltrate. 2. Anterior seventh left rib fracture. 3. Bilateral nonobstructing renal calculi. 4. Colonic diverticulosis. 5. Aortic atherosclerosis. Aortic Atherosclerosis (ICD10-I70.0). Electronically Signed   By: Virgina Norfolk M.D.   On: 02/12/2022 02:08   CT HEAD WO CONTRAST (5MM)  Result Date: 02/12/2022 CLINICAL DATA:  Fall while on anti  coagulation EXAM: CT HEAD WITHOUT CONTRAST CT CERVICAL SPINE WITHOUT CONTRAST TECHNIQUE: Multidetector CT imaging of the head and cervical spine was performed following the standard protocol without intravenous contrast. Multiplanar CT image reconstructions of the cervical spine were also generated. RADIATION DOSE REDUCTION: This exam was performed according to the departmental dose-optimization program which includes automated exposure control, adjustment of the mA and/or kV according to patient size and/or use of iterative reconstruction technique. COMPARISON:  None Available. FINDINGS: CT HEAD FINDINGS Brain: Small area of subarachnoid hemorrhage at the superior medial right convexity. The size and configuration of the ventricles and extra-axial CSF spaces are normal. The brain parenchyma is normal, without evidence of acute or chronic infarction. Vascular: No abnormal hyperdensity of the major intracranial arteries or dural venous sinuses. No intracranial atherosclerosis. Skull: The visualized skull base, calvarium and extracranial soft  tissues are normal. Sinuses/Orbits: No fluid levels or advanced mucosal thickening of the visualized paranasal sinuses. No mastoid or middle ear effusion. The orbits are normal. CT CERVICAL SPINE FINDINGS Alignment: No static subluxation. Facets are aligned. Occipital condyles are normally positioned. Skull base and vertebrae: No acute fracture. Soft tissues and spinal canal: No prevertebral fluid or swelling. No visible canal hematoma. Disc levels: No advanced spinal canal or neural foraminal stenosis. Upper chest: No pneumothorax, pulmonary nodule or pleural effusion. Other: Normal visualized paraspinal cervical soft tissues. IMPRESSION: 1. Small area of subarachnoid hemorrhage at the superior medial right convexity. 2. No acute fracture or static subluxation of the cervical spine. Critical Value/emergent results were called by telephone at the time of interpretation on 02/12/2022 at 2:07 am to provider [facility] , who verbally acknowledged these results. Electronically Signed   By: Ulyses Jarred M.D.   On: 02/12/2022 02:07   CT CERVICAL SPINE WO CONTRAST  Result Date: 02/12/2022 CLINICAL DATA:  Fall while on anti coagulation EXAM: CT HEAD WITHOUT CONTRAST CT CERVICAL SPINE WITHOUT CONTRAST TECHNIQUE: Multidetector CT imaging of the head and cervical spine was performed following the standard protocol without intravenous contrast. Multiplanar CT image reconstructions of the cervical spine were also generated. RADIATION DOSE REDUCTION: This exam was performed according to the departmental dose-optimization program which includes automated exposure control, adjustment of the mA and/or kV according to patient size and/or use of iterative reconstruction technique. COMPARISON:  None Available. FINDINGS: CT HEAD FINDINGS Brain: Small area of subarachnoid hemorrhage at the superior medial right convexity. The size and configuration of the ventricles and extra-axial CSF spaces are normal. The brain parenchyma is  normal, without evidence of acute or chronic infarction. Vascular: No abnormal hyperdensity of the major intracranial arteries or dural venous sinuses. No intracranial atherosclerosis. Skull: The visualized skull base, calvarium and extracranial soft tissues are normal. Sinuses/Orbits: No fluid levels or advanced mucosal thickening of the visualized paranasal sinuses. No mastoid or middle ear effusion. The orbits are normal. CT CERVICAL SPINE FINDINGS Alignment: No static subluxation. Facets are aligned. Occipital condyles are normally positioned. Skull base and vertebrae: No acute fracture. Soft tissues and spinal canal: No prevertebral fluid or swelling. No visible canal hematoma. Disc levels: No advanced spinal canal or neural foraminal stenosis. Upper chest: No pneumothorax, pulmonary nodule or pleural effusion. Other: Normal visualized paraspinal cervical soft tissues. IMPRESSION: 1. Small area of subarachnoid hemorrhage at the superior medial right convexity. 2. No acute fracture or static subluxation of the cervical spine. Critical Value/emergent results were called by telephone at the time of interpretation on 02/12/2022 at 2:07 am to provider [facility] , who  verbally acknowledged these results. Electronically Signed   By: Ulyses Jarred M.D.   On: 02/12/2022 02:07   CT L-SPINE NO CHARGE  Result Date: 02/12/2022 CLINICAL DATA:  Fall EXAM: CT LUMBAR SPINE WITHOUT CONTRAST TECHNIQUE: Multidetector CT imaging of the lumbar spine was performed without intravenous contrast administration. Multiplanar CT image reconstructions were also generated. RADIATION DOSE REDUCTION: This exam was performed according to the departmental dose-optimization program which includes automated exposure control, adjustment of the mA and/or kV according to patient size and/or use of iterative reconstruction technique. COMPARISON:  None Available. FINDINGS: Segmentation: 5 lumbar type vertebrae. Alignment: Normal. Vertebrae:  Wedge compression deformity of L1 is age indeterminate but unchanged since 01/22/2022. Disc levels: There is mild spinal canal stenosis at L2-3 and L3-4. Multilevel facet arthrosis and foraminal stenosis, worst at L2-3. IMPRESSION: 1. Wedge compression deformity of L1 is age indeterminate but unchanged since 01/22/2022. 2. Multilevel facet arthrosis and foraminal stenosis, worst at L2-3. Electronically Signed   By: Ulyses Jarred M.D.   On: 02/12/2022 02:04   CT Angio Chest Pulmonary Embolism (PE) W or WO Contrast  Result Date: 02/12/2022 CLINICAL DATA:  Syncope and subsequent fall. EXAM: CT ANGIOGRAPHY CHEST WITH CONTRAST TECHNIQUE: Multidetector CT imaging of the chest was performed using the standard protocol during bolus administration of intravenous contrast. Multiplanar CT image reconstructions and MIPs were obtained to evaluate the vascular anatomy. RADIATION DOSE REDUCTION: This exam was performed according to the departmental dose-optimization program which includes automated exposure control, adjustment of the mA and/or kV according to patient size and/or use of iterative reconstruction technique. CONTRAST:  16mL OMNIPAQUE IOHEXOL 350 MG/ML SOLN COMPARISON:  February 02, 2022 FINDINGS: Cardiovascular: There is mild calcification of the aortic arch, without evidence of aortic aneurysm. Satisfactory opacification of the pulmonary arteries to the segmental level. No evidence of pulmonary embolism. There is mild to moderate severity cardiomegaly with marked severity coronary artery calcification and coronary artery vascular clips. No pericardial effusion. Mediastinum/Nodes: Multiple sternal wires are noted. No enlarged mediastinal, hilar, or axillary lymph nodes. A stable subcentimeter thyroid nodule is seen within the posterior aspect of the left lobe of the thyroid gland. The trachea and esophagus demonstrate no significant findings. Lungs/Pleura: Mild atelectasis and/or early infiltrate is seen within  the posterior aspect of the right lung base. There is no evidence of a pleural effusion or pneumothorax. Upper Abdomen: No acute abnormality. Musculoskeletal: Acute anterior seventh left rib fracture is seen. A chronic posterolateral second left rib fracture is also noted. Multilevel degenerative changes seen throughout the thoracic spine. Review of the MIP images confirms the above findings. IMPRESSION: 1. No evidence of pulmonary embolism. 2. Mild right basilar atelectasis and/or early infiltrate. 3. Evidence of prior median sternotomy/CABG. 4. Anterior seventh left rib fracture. 5. Aortic atherosclerosis. Aortic Atherosclerosis (ICD10-I70.0). Electronically Signed   By: Virgina Norfolk M.D.   On: 02/12/2022 02:02   DG Chest Port 1 View  Result Date: 02/12/2022 CLINICAL DATA:  Recent fall EXAM: PORTABLE CHEST 1 VIEW COMPARISON:  01/21/2022 FINDINGS: Cardiac shadow is enlarged. Postsurgical changes are noted. Lungs are clear bilaterally. No bony abnormality is noted. IMPRESSION: No active disease. Electronically Signed   By: Inez Catalina M.D.   On: 02/12/2022 00:49   DG CHEST PORT 1 VIEW  Result Date: 02/03/2022 CLINICAL DATA:  Fever EXAM: PORTABLE CHEST 1 VIEW COMPARISON:  Previous studies including the examination of 02/02/2022 FINDINGS: Transverse diameter of heart is increased. There is previous coronary bypass surgery. Tip of endotracheal tube  is 5.4 cm above the carina. Enteric tube is noted traversing the esophagus. Increased markings are seen in right lower lung fields. There is no focal consolidation. There is no pleural effusion or pneumothorax. IMPRESSION: Cardiomegaly. There are no signs of alveolar pulmonary edema. Increased interstitial markings are seen in right lower lung fields suggesting atelectasis/pneumonia. Electronically Signed   By: Elmer Picker M.D.   On: 02/03/2022 12:34   ECHOCARDIOGRAM COMPLETE  Result Date: 02/03/2022    ECHOCARDIOGRAM REPORT   Patient Name:    Romari Jett Date of Exam: 02/03/2022 Medical Rec #:  [id number]     Height:       70.0 in Accession #:    [id number]    Weight:       239.4 lb Date of Birth:  [birth year]      BSA:          2.253 m Patient Age:    66 years      BP:           92/51 mmHg Patient Gender: M             HR:           86 bpm. Exam Location:  Inpatient Procedure: 2D Echo, Cardiac Doppler, Color Doppler and Intracardiac            Opacification Agent Indications:    Syncope R55  History:        Patient has no prior history of Echocardiogram examinations.                 CAD, Prior CABG, COPD and PAD, Aortic Valve Disease; Risk                 Factors:Current Smoker. 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  Sonographer:    Darlina Sicilian RDCS Referring Phys: [id number] Francesca Jewett  Sonographer Comments: Suboptimal apical window, suboptimal parasternal window, suboptimal subcostal window, echo performed with patient supine and on artificial respirator and Technically difficult study due to poor echo windows. IMPRESSIONS  1. Peak gradient of 33 mmHg despite mildly decrease LV stroke volume. DVI 0.26. Gradients were obtained ~ 1 minute after contrast administration with no contrast in the LV cavity. 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 The aortic valve is calcified. There is moderate calcification of the aortic valve. There is moderate thickening of the aortic valve. Aortic valve regurgitation is mild. Severe aortic valve stenosis. Aortic valve area, by VTI measures 0.80 cm. Aortic valve mean gradient measures 33.0 mmHg. Aortic valve Vmax measures 3.66 m/s.  2. Left ventricular ejection fraction, by estimation, is 55 to 60%. The left ventricle has normal function. The left ventricle has no regional wall motion abnormalities. The left ventricular internal cavity size was mildly dilated. Left ventricular diastolic parameters are indeterminate.  3. Right ventricular systolic  function was not well visualized. The right ventricular size is not well visualized.  4. The mitral valve is grossly normal. No evidence of mitral valve regurgitation. No evidence of mitral stenosis.  5. The inferior vena cava is dilated in size with >50% respiratory variability, suggesting right atrial pressure of 8 mmHg. Comparison(s): No prior Echocardiogram. FINDINGS  Left Ventricle: Left ventricular ejection fraction, by estimation, is 55 to 60%. The left ventricle has normal function. The left ventricle has no regional wall motion abnormalities. Definity contrast agent was given IV to  delineate the left ventricular  endocardial borders. The left ventricular internal cavity size was mildly dilated. There is no left ventricular hypertrophy. Left ventricular diastolic parameters are indeterminate. Right Ventricle: The right ventricular size is not well visualized. Right vetricular wall thickness was not well visualized. Right ventricular systolic function was not well visualized. Left Atrium: Left atrial size was normal in size. Right Atrium: Right atrial size was not well visualized. Pericardium: Trivial pericardial effusion is present. Mitral Valve: The mitral valve is grossly normal. No evidence of mitral valve regurgitation. No evidence of mitral valve stenosis. Tricuspid Valve: The tricuspid valve is not well visualized. Tricuspid valve regurgitation is not demonstrated. No evidence of tricuspid stenosis. Aortic Valve: Peak gradient of 33 mmHg despite mildly decrease LV stroke volume. DVI 0.26. Gradients were obtained ~ 1 minute after contrast administration with no contrast in the LV cavity. 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 The aortic valve is calcified. There is moderate calcification of the aortic valve. There is moderate thickening of the aortic valve. There is moderate aortic valve annular calcification. Aortic valve regurgitation is mild. Severe aortic stenosis is  present. Aortic valve mean gradient measures 33.0 mmHg. Aortic valve peak gradient measures 53.6 mmHg. Aortic valve area, by VTI measures 0.80 cm. Pulmonic Valve: The pulmonic valve was not well visualized. Pulmonic valve regurgitation is trivial. No evidence of pulmonic stenosis. Aorta: The aortic root is normal in size and structure. Venous: The inferior vena cava is dilated in size with greater than 50% respiratory variability, suggesting right atrial pressure of 8 mmHg. IAS/Shunts: No atrial level shunt detected by color flow Doppler.  LEFT VENTRICLE PLAX 2D LVIDd:         5.98 cm      Diastology LVIDs:         4.99 cm      LV e' medial:    4.62 cm/s LV PW:         0.85 cm      LV E/e' medial:  23.4 LV IVS:        0.91 cm      LV e' lateral:   6.16 cm/s LVOT diam:     2.00 cm      LV E/e' lateral: 17.5 LV SV:         68 LV SV Index:   30 LVOT Area:     3.14 cm  LV Volumes (MOD) LV vol d, MOD A2C: 164.0 ml LV vol d, MOD A4C: 186.0 ml LV vol s, MOD A2C: 69.9 ml LV vol s, MOD A4C: 78.3 ml LV SV MOD A2C:     94.1 ml LV SV MOD A4C:     186.0 ml LV SV MOD BP:      101.4 ml LEFT ATRIUM             Index LA diam:        3.70 cm 1.64 cm/m LA Vol (A2C):   48.6 ml 21.57 ml/m LA Vol (A4C):   57.9 ml 25.70 ml/m LA Biplane Vol: 56.5 ml 25.08 ml/m  AORTIC VALVE AV Area (Vmax):    0.94 cm AV Area (Vmean):   0.97 cm AV Area (VTI):     0.80 cm AV Vmax:           366.00 cm/s AV Vmean:          263.500 cm/s AV VTI:            0.840 m AV Peak  Grad:      53.6 mmHg AV Mean Grad:      33.0 mmHg LVOT Vmax:         110.00 cm/s LVOT Vmean:        81.700 cm/s LVOT VTI:          0.215 m LVOT/AV VTI ratio: 0.26  AORTA Ao Root diam: 2.60 cm MITRAL VALVE MV Area (PHT): 4.26 cm     SHUNTS MV Decel Time: 178 msec     Systemic VTI:  0.22 m MV E velocity: 108.00 cm/s  Systemic Diam: 2.00 cm MV A velocity: 90.30 cm/s MV E/A ratio:  1.20 Rudean Haskell MD Electronically signed by Rudean Haskell MD Signature Date/Time:  02/03/2022/10:25:21 AM    Final    Overnight EEG with video  Result Date: 02/03/2022 Lora Havens, MD     02/04/2022  8:52 AM Patient Name: Talis Greenlief MRN: [id number] Epilepsy Attending: Lora Havens Referring Physician/Provider: Julian Hy, DO Duration: 02/02/2022 1055 to 02/03/2022 1055  Patient history: 66 year old male with altered mental status.  EEG to evaluate for seizure.  Level of alertness: comatose  AEDs during EEG study: GBP, TPM, Propofol  Technical aspects: This EEG study was done with scalp electrodes positioned according to the 10-20 International system of electrode placement. Electrical activity was reviewed with band pass filter of 1-70Hz , sensitivity of 7 uV/mm, display speed of 60mm/sec with a 60Hz  notched filter applied as appropriate. EEG data were recorded continuously and digitally stored.  Video monitoring was available and reviewed as appropriate.  Description: EEG showed continuous generalized 3 to 6 Hz theta-delta slowing admixed with intermittent generalized 15 to 18 Hz beta activity. Hyperventilation and photic stimulation were not performed.    ABNORMALITY - Continuous slow, generalized  IMPRESSION: This study is suggestive of severe diffuse encephalopathy, nonspecific etiology but likely due to sedation. No seizures or epileptiform discharges were seen throughout the recording.  Lora Havens    CT ANGIO HEAD NECK W WO CM  Addendum Date: 02/03/2022   ADDENDUM REPORT: 02/03/2022 05:50 ADDENDUM: #4 was Study discussed by telephone with ICU Nurse Brittney at 0528 hours on 02/03/2022. Electronically Signed   By: Genevie Ann M.D.   On: 02/03/2022 05:50   Result Date: 02/03/2022 CLINICAL DATA:  66 year old male status post fall, seizure. Intubated. Unrevealing brain MRI. EXAM: CT ANGIOGRAPHY HEAD AND NECK TECHNIQUE: Multidetector CT imaging of the head and neck was performed using the standard protocol during bolus administration of intravenous contrast.  Multiplanar CT image reconstructions and MIPs were obtained to evaluate the vascular anatomy. Carotid stenosis measurements (when applicable) are obtained utilizing NASCET criteria, using the distal internal carotid diameter as the denominator. RADIATION DOSE REDUCTION: This exam was performed according to the departmental dose-optimization program which includes automated exposure control, adjustment of the mA and/or kV according to patient size and/or use of iterative reconstruction technique. CONTRAST:  68mL OMNIPAQUE IOHEXOL 350 MG/ML SOLN COMPARISON:  02/02/2022 brain MRI, head and cervical spine CT, CT Chest, Abdomen, and Pelvis. FINDINGS: CT HEAD Brain: Mild EEG lead artifact now. Stable non contrast CT appearance of the brain. No acute or evolving infarct identified. No acute intracranial hemorrhage identified. No intracranial mass effect. Calvarium and skull base: Stable and intact. Paranasal sinuses: Remains intubated. Scattered paranasal sinus fluid and mucosal thickening is stable. Tympanic cavities and mastoids remain clear. Orbits: No acute orbit or scalp soft tissue finding. EEG leads are in place. CTA NECK Skeleton: Carious dentition. Spine degeneration. Previous  sternotomy. No acute osseous abnormality identified. Upper chest: Prior CABG. Endotracheal tube terminates at the carina. Enteric tube courses into the esophagus. Visible mediastinum appears stable from a chest CTA 07/13/2020, with occasional maximal nonspecific mediastinal lymph nodes (right paratracheal series 8, image 164). Upper lungs remain clear. Other neck: Intubated and oral enteric tube in place with fluid in the pharynx. No superimposed acute neck soft tissue finding identified. Aortic arch: Prior CABG. Calcified aortic atherosclerosis. 3 vessel arch configuration. Right carotid system: Brachiocephalic soft and calcified plaque without stenosis. Negative right CCA origin. No right CCA plaque before the bifurcation. Soft more than  calcified plaque at the right ICA origin with less than 50 % stenosis with respect to the distal vessel. Left carotid system: Mild left CCA origin plaque without stenosis. Calcified plaque at the left carotid bifurcation and ICA origin with less than 50 % stenosis with respect to the distal vessel. Vertebral arteries: Proximal right subclavian artery soft and calcified plaque without stenosis. Normal right vertebral artery origin. Right vertebral artery appears codominant and normal to the skull base. Proximal left subclavian artery soft and calcified plaque with no significant stenosis. Plaque abuts the left vertebral artery origin with only mild origin stenosis on series 14, image 125. Superimposed left V1 calcified plaque on series 13, image 298 with only mild stenosis. Otherwise codominant and normal left vertebral artery to the skull base. CTA HEAD Posterior circulation: Mild left vertebral V4 calcified plaque without stenosis. Left PICA origin remains patent. Bulky right V4 calcified plaque and moderate to severe right V4 stenosis (series 13, image 155 and series 14, image 114) but the distal right vertebral remains patent. Patent vertebrobasilar junction without stenosis. Right AICA appears dominant and patent. Patent mildly tortuous basilar artery without stenosis. Normal SCA and PCA origins. Small posterior communicating arteries. Bilateral PCA branches are within normal limits. Anterior circulation: Both ICA siphons are patent. Tortuous left siphon cavernous segment with up to moderate calcified plaque and mild stenosis. Similar right siphon cavernous segment tortuosity and moderate calcified plaque but only mild right siphon stenosis. Patent carotid termini. Patent MCA and ACA origins. Dominant left A1, the right A1 is diminutive. Anterior communicating artery and bilateral ACA branches appear patent without stenosis. Ectatic bilateral ACA/pericallosal vessel branching (series 18, image 20), but no  discrete ACA aneurysm. Left MCA M1 segment and bifurcation are patent without stenosis. Right MCA M1 segment and bifurcation are patent without stenosis. Bilateral MCA branches are within normal limits. Venous sinuses: Patent. Anatomic variants: Dominant left and diminutive right ACA A1 segments. Review of the MIP images confirms the above findings IMPRESSION: 1. Negative for large vessel occlusion. Positive for bulky calcified plaque at the Right Vertebral Artery V4 segment with moderate to severe stenosis. But the vessel remains patent. 2. No other significant arterial stenosis in the head or neck. Bilateral carotid bifurcation, bilateral cavernous ICA, and left vertebral artery origin plaque with only mild stenosis. 3. Stable CT appearance of the brain. No acute intracranial abnormality identified. 4. Endotracheal tube terminates at the carina, retract 1 cm for more optimal placement. Enteric tube courses into the esophagus. 5. Prior CABG. Aortic Atherosclerosis (ICD10-I70.0). Carious dentition. Electronically Signed: By: Genevie Ann M.D. On: 02/03/2022 05:14   MR BRAIN W WO CONTRAST  Result Date: 02/02/2022 CLINICAL DATA:  Seizure, new-onset, history of trauma EXAM: MRI HEAD WITHOUT AND WITH CONTRAST TECHNIQUE: Multiplanar, multiecho pulse sequences of the brain and surrounding structures were obtained without and with intravenous contrast. CONTRAST:  14mL GADAVIST  GADOBUTROL 1 MMOL/ML IV SOLN COMPARISON:  CT head from the same day.  MRI head January 28, 2021. FINDINGS: Brain: No acute infarction, hemorrhage, hydrocephalus, extra-axial collection or mass lesion. Hippocampi appear grossly within normal limits and symmetric, although evaluation is limited due to motion on the coronal sequences. No pathologic enhancement. Vascular: Major arterial flow voids are maintained at the skull base. Skull and upper cervical spine: Normal marrow signal. Sinuses/Orbits: Paranasal sinus mucosal thickening with air-fluid  levels. No acute orbital findings. Other: No mastoid effusions. IMPRESSION: No evidence of acute intracranial abnormality. Electronically Signed   By: Margaretha Sheffield M.D.   On: 02/02/2022 15:21   EEG adult  Result Date: 02/02/2022 Lora Havens, MD     02/02/2022  1:46 PM Patient Name: Frandy Edmison MRN: [id number] Epilepsy Attending: Lora Havens Referring Physician/Provider: Gleason, Otilio Carpen, PA-C Date: 02/02/2022 Duration: 23.36 mins Patient history: 66 year old male with altered mental status.  EEG to evaluate for seizure. Level of alertness: comatose AEDs during EEG study: GBP, Propofol Technical aspects: This EEG study was done with scalp electrodes positioned according to the 10-20 International system of electrode placement. Electrical activity was reviewed with band pass filter of 1-70Hz , sensitivity of 7 uV/mm, display speed of 41mm/sec with a 60Hz  notched filter applied as appropriate. EEG data were recorded continuously and digitally stored.  Video monitoring was available and reviewed as appropriate. Description: EEG showed continuous generalized 3 to 6 Hz theta-delta slowing admixed with 15 to 18 Hz beta activity distributed symmetrically and diffusely. Hyperventilation and photic stimulation were not performed.   ABNORMALITY - Continuous slow, generalized IMPRESSION: This study is suggestive of severe diffuse encephalopathy, nonspecific etiology. No seizures or epileptiform discharges were seen throughout the recording. Lora Havens   DG Abd Portable 1V  Result Date: 02/02/2022 CLINICAL DATA:  Orogastric tube placement. EXAM: PORTABLE ABDOMEN - 1 VIEW COMPARISON:  None Available. FINDINGS: Distal tip of nasogastric tube is seen in expected position of distal stomach. IMPRESSION: Distal tip of nasogastric tube is seen in expected position of distal stomach. Electronically Signed   By: Marijo Conception M.D.   On: 02/02/2022 09:36   CT HEAD WO CONTRAST  Result Date:  02/02/2022 CLINICAL DATA:  Fall injury.  Level 1 trauma. EXAM: CT HEAD WITHOUT CONTRAST CT CERVICAL SPINE WITHOUT CONTRAST CT CHEST, ABDOMEN AND PELVIS WITH CONTRAST TECHNIQUE: Contiguous axial images were obtained from the base of the skull through the vertex without intravenous contrast. Multidetector CT imaging of the cervical spine was performed without intravenous contrast. Multiplanar CT image reconstructions were also generated. Multidetector CT imaging of the chest, abdomen and pelvis was performed following the standard protocol during bolus administration of intravenous contrast. RADIATION DOSE REDUCTION: This exam was performed according to the departmental dose-optimization program which includes automated exposure control, adjustment of the mA and/or kV according to patient size and/or use of iterative reconstruction technique. CONTRAST:  52mL OMNIPAQUE IOHEXOL 350 MG/ML SOLN COMPARISON:  Head CT and cervical spine CT both 07/07/2021, CTA of abdomen and pelvis recently 01/22/2022, and CTA chest 07/13/2020. FINDINGS: CT HEAD FINDINGS Brain: There is mild global atrophy, mild small-vessel disease of the cerebral white matter. The ventricles are normal in size and position. No new asymmetry is seen worrisome for an acute hemorrhage, cortical based acute infarct, or mass. Vascular: There calcifications in the distal vertebral arteries and siphons but no hyperdense central vessel. Skull: Negative for fractures or focal lesions. I see no focal scalp hematoma. Sinuses/Orbits: Prominent bilateral  proptosis with proliferated intraorbital fat, but without extraocular muscle thickening. This was seen previously. There is partial opacification of the ethmoid air cells. Trace membrane thickening in the maxillary sinuses. Other visualized sinuses, bilateral mastoid air cells and middle ears are clear. Other: None. CT CERVICAL FINDINGS Alignment: Normal. Skull base and vertebrae: The vertebra are normally  mineralized. No fracture is evident. Joint space loss and spurring is again noted of the anterior atlantodental joint with the C1 lateral masses normally positioned on C2. Soft tissues and spinal canal: No prevertebral fluid or swelling. No visible canal hematoma. There calcifications in the left-greater-than-right proximal cervical ICAs. Disc levels: There is mild disc space loss at C4-5 and moderate to advanced disc space loss C5-6 and C6-7, mild-to-moderate disc space loss C7-T1, unchanged. There are anterior osteophytes without significant posterior osteophytes at C2-3, C3-4 and C4-5. At C5-6 and C6-7 there are bidirectional osteophytes posterior disc osteophyte complexes encroaching on the ventral cord surface to left-greater-than-right. Other levels do not show significant soft tissue or bony encroachment on the thecal sac. Facet joint and uncinate hypertrophy is seen at most levels. Acquired foraminal stenosis is mild on the right at C2-3, moderate on the left at C3-4, bilaterally moderate to severe C5-6, and moderate to severe on the left at C6-7. Other:  None. CT CHEST FINDINGS Cardiovascular: There is mild cardiomegaly, CABG change and three-vessel calcific CAD. There is no pericardial effusion. The pulmonary arteries and veins are normal caliber. The pulmonary arteries are centrally clear. The aorta is normal caliber without stenosis, aneurysm or dissection with mild patchy calcific plaques, and moderate calcification in the aortic valve leaflets. There are scattered calcifications in the great vessels without stenosis. Mediastinum/Nodes: No enlarged mediastinal, hilar, or axillary lymph nodes. Thyroid gland and thoracic esophagus demonstrate no significant findings. There are retained secretions in the distal trachea and ETT in place with tip 2.5 cm from the carina. The main bronchi are clear. Lungs/Pleura: Again noted scattered linear scarring or atelectasis in the lung bases, predominantly in the lower  lobes. No infiltrate, contusion or nodule are seen. No pleural effusion, thickening or pneumothorax. Musculoskeletal: There are degenerative changes of the spine as well as multilevel bridging enthesopathy. There is no spinal compression injury. No acute regional skeletal abnormality is visible. The ribcage is intact. CT ABDOMEN PELVIS FINDINGS Hepatobiliary: No hepatic injury or perihepatic hematoma. Gallbladder is unremarkable the liver is 20 cm length with mild general steatosis without mass. There is no biliary dilatation. Pancreas: Partially atrophic.  Otherwise unremarkable. Spleen: No splenic injury or perisplenic hematoma. Adrenals/Urinary Tract: There is no adrenal mass or hemorrhage. There is no renal mass enhancement or hemorrhage. Bilateral nonobstructive nephrolithiasis. Largest stone is 1 cm on the right. Perinephric stranding is unchanged. There is no hydronephrosis, ureteral stone or bladder thickening. Stomach/Bowel: The stomach and unopacified small bowel are unremarkable. An appendix is not seen. There is mild wall thickening versus underdistention in the ascending colon. There is mild fecal stasis and colonic interposition of the hepatic flexure in front of the liver. Scattered sigmoid diverticula without evidence of diverticulitis. Vascular/Lymphatic: Aortic atherosclerosis. No enlarged abdominal or pelvic lymph nodes. Stable asymmetry of ectasia in the left common iliac artery which measures 1.7 cm. Reproductive: No prostatomegaly. Other: Right gluteal subcutaneous hematoma measuring 9.3 x 3.8 cm on 4:98, at a similar level previously having measured 9.1 x 3.6 cm therefore only slightly larger. No active hemorrhaging vessel is seen within the collection. Stranding in the bilateral lateral upper thighs is again  also noted greater on the right. There is no free fluid, free air, free hemorrhage or incarcerated hernia. Musculoskeletal: There is a mild superior endplate anterior wedge compression  fracture of the L1 vertebral body, but this was seen previously. There is mild osteopenia and advanced degenerative change of the lumbar spine. No acute regional osseous abnormality is seen. Ankylosis across the anterior right SI joint. Multilevel lumbar foraminal stenosis. IMPRESSION: 1. No acute intracranial CT findings or depressed skull fractures. Atrophy and small-vessel disease. 2. Bilateral proptosis with proliferated intraorbital fat, but without extraocular muscle thickening. This was seen previously and could be due to obesity, chronic corticosteroid use, thyroid ophthalmopathy or Graves' disease. 3. Cervical degenerative changes without evidence of fractures or listhesis. 4. 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 Aortic and coronary artery atherosclerosis. No findings of acute CHF. 5. Intubated, with retained secretions in the distal trachea. 6. No acute trauma related findings in the chest, abdomen or pelvis. 7. Right gluteal subcutaneous hematoma measuring 9.3 x 3.8 cm, only slightly larger than on 06/22/2021. No active hemorrhaging vessel is seen within the hematoma. 8. Ascending colitis versus nondistention. Constipation and diverticulosis. 9. Nonobstructive nephrolithiasis. 10. Osteopenia and degenerative change without acute regional skeletal abnormality. 11. Results were called to Dr. Chelsea Primus at 4:39 a.m., 02/02/2022 immediately after review of available images. Aortic Atherosclerosis (ICD10-I70.0). Electronically Signed   By: Telford Nab M.D.   On: 02/02/2022 05:37   CT CERVICAL SPINE WO CONTRAST  Result Date: 02/02/2022 CLINICAL DATA:  Fall injury.  Level 1 trauma. EXAM: CT HEAD WITHOUT CONTRAST CT CERVICAL SPINE WITHOUT CONTRAST CT CHEST, ABDOMEN AND PELVIS WITH CONTRAST TECHNIQUE: Contiguous axial images were obtained from the base of the skull through the vertex without intravenous contrast. Multidetector CT imaging of the cervical spine was performed without intravenous  contrast. Multiplanar CT image reconstructions were also generated. Multidetector CT imaging of the chest, abdomen and pelvis was performed following the standard protocol during bolus administration of intravenous contrast. RADIATION DOSE REDUCTION: This exam was performed according to the departmental dose-optimization program which includes automated exposure control, adjustment of the mA and/or kV according to patient size and/or use of iterative reconstruction technique. CONTRAST:  89mL OMNIPAQUE IOHEXOL 350 MG/ML SOLN COMPARISON:  Head CT and cervical spine CT both 07/07/2021, CTA of abdomen and pelvis recently 01/22/2022, and CTA chest 07/13/2020. FINDINGS: CT HEAD FINDINGS Brain: There is mild global atrophy, mild small-vessel disease of the cerebral white matter. The ventricles are normal in size and position. No new asymmetry is seen worrisome for an acute hemorrhage, cortical based acute infarct, or mass. Vascular: There calcifications in the distal vertebral arteries and siphons but no hyperdense central vessel. Skull: Negative for fractures or focal lesions. I see no focal scalp hematoma. Sinuses/Orbits: Prominent bilateral proptosis with proliferated intraorbital fat, but without extraocular muscle thickening. This was seen previously. There is partial opacification of the ethmoid air cells. Trace membrane thickening in the maxillary sinuses. Other visualized sinuses, bilateral mastoid air cells and middle ears are clear. Other: None. CT CERVICAL FINDINGS Alignment: Normal. Skull base and vertebrae: The vertebra are normally mineralized. No fracture is evident. Joint space loss and spurring is again noted of the anterior atlantodental joint with the C1 lateral masses normally positioned on C2. Soft tissues and spinal canal: No prevertebral fluid or swelling. No visible canal hematoma. There calcifications in the left-greater-than-right proximal cervical ICAs. Disc levels: There is mild disc space loss  at C4-5 and moderate to advanced disc space loss  C5-6 and C6-7, mild-to-moderate disc space loss C7-T1, unchanged. There are anterior osteophytes without significant posterior osteophytes at C2-3, C3-4 and C4-5. At C5-6 and C6-7 there are bidirectional osteophytes posterior disc osteophyte complexes encroaching on the ventral cord surface to left-greater-than-right. Other levels do not show significant soft tissue or bony encroachment on the thecal sac. Facet joint and uncinate hypertrophy is seen at most levels. Acquired foraminal stenosis is mild on the right at C2-3, moderate on the left at C3-4, bilaterally moderate to severe C5-6, and moderate to severe on the left at C6-7. Other:  None. CT CHEST FINDINGS Cardiovascular: There is mild cardiomegaly, CABG change and three-vessel calcific CAD. There is no pericardial effusion. The pulmonary arteries and veins are normal caliber. The pulmonary arteries are centrally clear. The aorta is normal caliber without stenosis, aneurysm or dissection with mild patchy calcific plaques, and moderate calcification in the aortic valve leaflets. There are scattered calcifications in the great vessels without stenosis. Mediastinum/Nodes: No enlarged mediastinal, hilar, or axillary lymph nodes. Thyroid gland and thoracic esophagus demonstrate no significant findings. There are retained secretions in the distal trachea and ETT in place with tip 2.5 cm from the carina. The main bronchi are clear. Lungs/Pleura: Again noted scattered linear scarring or atelectasis in the lung bases, predominantly in the lower lobes. No infiltrate, contusion or nodule are seen. No pleural effusion, thickening or pneumothorax. Musculoskeletal: There are degenerative changes of the spine as well as multilevel bridging enthesopathy. There is no spinal compression injury. No acute regional skeletal abnormality is visible. The ribcage is intact. CT ABDOMEN PELVIS FINDINGS Hepatobiliary: No hepatic injury or  perihepatic hematoma. Gallbladder is unremarkable the liver is 20 cm length with mild general steatosis without mass. There is no biliary dilatation. Pancreas: Partially atrophic.  Otherwise unremarkable. Spleen: No splenic injury or perisplenic hematoma. Adrenals/Urinary Tract: There is no adrenal mass or hemorrhage. There is no renal mass enhancement or hemorrhage. Bilateral nonobstructive nephrolithiasis. Largest stone is 1 cm on the right. Perinephric stranding is unchanged. There is no hydronephrosis, ureteral stone or bladder thickening. Stomach/Bowel: The stomach and unopacified small bowel are unremarkable. An appendix is not seen. There is mild wall thickening versus underdistention in the ascending colon. There is mild fecal stasis and colonic interposition of the hepatic flexure in front of the liver. Scattered sigmoid diverticula without evidence of diverticulitis. Vascular/Lymphatic: Aortic atherosclerosis. No enlarged abdominal or pelvic lymph nodes. Stable asymmetry of ectasia in the left common iliac artery which measures 1.7 cm. Reproductive: No prostatomegaly. Other: Right gluteal subcutaneous hematoma measuring 9.3 x 3.8 cm on 4:98, at a similar level previously having measured 9.1 x 3.6 cm therefore only slightly larger. No active hemorrhaging vessel is seen within the collection. Stranding in the bilateral lateral upper thighs is again also noted greater on the right. There is no free fluid, free air, free hemorrhage or incarcerated hernia. Musculoskeletal: There is a mild superior endplate anterior wedge compression fracture of the L1 vertebral body, but this was seen previously. There is mild osteopenia and advanced degenerative change of the lumbar spine. No acute regional osseous abnormality is seen. Ankylosis across the anterior right SI joint. Multilevel lumbar foraminal stenosis. IMPRESSION: 1. No acute intracranial CT findings or depressed skull fractures. Atrophy and small-vessel  disease. 2. Bilateral proptosis with proliferated intraorbital fat, but without extraocular muscle thickening. This was seen previously and could be due to obesity, chronic corticosteroid use, thyroid ophthalmopathy or Graves' disease. 3. Cervical degenerative changes without evidence of fractures or  listhesis. 4. 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 Aortic and coronary artery atherosclerosis. No findings of acute CHF. 5. Intubated, with retained secretions in the distal trachea. 6. No acute trauma related findings in the chest, abdomen or pelvis. 7. Right gluteal subcutaneous hematoma measuring 9.3 x 3.8 cm, only slightly larger than on 06/22/2021. No active hemorrhaging vessel is seen within the hematoma. 8. Ascending colitis versus nondistention. Constipation and diverticulosis. 9. Nonobstructive nephrolithiasis. 10. Osteopenia and degenerative change without acute regional skeletal abnormality. 11. Results were called to Dr. Junius Finner at 4:39 a.m., 02/02/2022 immediately after review of available images. Aortic Atherosclerosis (ICD10-I70.0). Electronically Signed   By: Almira Bar M.D.   On: 02/02/2022 05:37   CT CHEST ABDOMEN PELVIS W CONTRAST  Result Date: 02/02/2022 CLINICAL DATA:  Fall injury.  Level 1 trauma. EXAM: CT HEAD WITHOUT CONTRAST CT CERVICAL SPINE WITHOUT CONTRAST CT CHEST, ABDOMEN AND PELVIS WITH CONTRAST TECHNIQUE: Contiguous axial images were obtained from the base of the skull through the vertex without intravenous contrast. Multidetector CT imaging of the cervical spine was performed without intravenous contrast. Multiplanar CT image reconstructions were also generated. Multidetector CT imaging of the chest, abdomen and pelvis was performed following the standard protocol during bolus administration of intravenous contrast. RADIATION DOSE REDUCTION: This exam was performed according to the departmental dose-optimization program which includes automated exposure control,  adjustment of the mA and/or kV according to patient size and/or use of iterative reconstruction technique. CONTRAST:  47mL OMNIPAQUE IOHEXOL 350 MG/ML SOLN COMPARISON:  Head CT and cervical spine CT both 07/07/2021, CTA of abdomen and pelvis recently 01/22/2022, and CTA chest 07/13/2020. FINDINGS: CT HEAD FINDINGS Brain: There is mild global atrophy, mild small-vessel disease of the cerebral white matter. The ventricles are normal in size and position. No new asymmetry is seen worrisome for an acute hemorrhage, cortical based acute infarct, or mass. Vascular: There calcifications in the distal vertebral arteries and siphons but no hyperdense central vessel. Skull: Negative for fractures or focal lesions. I see no focal scalp hematoma. Sinuses/Orbits: Prominent bilateral proptosis with proliferated intraorbital fat, but without extraocular muscle thickening. This was seen previously. There is partial opacification of the ethmoid air cells. Trace membrane thickening in the maxillary sinuses. Other visualized sinuses, bilateral mastoid air cells and middle ears are clear. Other: None. CT CERVICAL FINDINGS Alignment: Normal. Skull base and vertebrae: The vertebra are normally mineralized. No fracture is evident. Joint space loss and spurring is again noted of the anterior atlantodental joint with the C1 lateral masses normally positioned on C2. Soft tissues and spinal canal: No prevertebral fluid or swelling. No visible canal hematoma. There calcifications in the left-greater-than-right proximal cervical ICAs. Disc levels: There is mild disc space loss at C4-5 and moderate to advanced disc space loss C5-6 and C6-7, mild-to-moderate disc space loss C7-T1, unchanged. There are anterior osteophytes without significant posterior osteophytes at C2-3, C3-4 and C4-5. At C5-6 and C6-7 there are bidirectional osteophytes posterior disc osteophyte complexes encroaching on the ventral cord surface to left-greater-than-right. Other  levels do not show significant soft tissue or bony encroachment on the thecal sac. Facet joint and uncinate hypertrophy is seen at most levels. Acquired foraminal stenosis is mild on the right at C2-3, moderate on the left at C3-4, bilaterally moderate to severe C5-6, and moderate to severe on the left at C6-7. Other:  None. CT CHEST FINDINGS Cardiovascular: There is mild cardiomegaly, CABG change and three-vessel calcific CAD. There is no pericardial effusion. The pulmonary arteries and  veins are normal caliber. The pulmonary arteries are centrally clear. The aorta is normal caliber without stenosis, aneurysm or dissection with mild patchy calcific plaques, and moderate calcification in the aortic valve leaflets. There are scattered calcifications in the great vessels without stenosis. Mediastinum/Nodes: No enlarged mediastinal, hilar, or axillary lymph nodes. Thyroid gland and thoracic esophagus demonstrate no significant findings. There are retained secretions in the distal trachea and ETT in place with tip 2.5 cm from the carina. The main bronchi are clear. Lungs/Pleura: Again noted scattered linear scarring or atelectasis in the lung bases, predominantly in the lower lobes. No infiltrate, contusion or nodule are seen. No pleural effusion, thickening or pneumothorax. Musculoskeletal: There are degenerative changes of the spine as well as multilevel bridging enthesopathy. There is no spinal compression injury. No acute regional skeletal abnormality is visible. The ribcage is intact. CT ABDOMEN PELVIS FINDINGS Hepatobiliary: No hepatic injury or perihepatic hematoma. Gallbladder is unremarkable the liver is 20 cm length with mild general steatosis without mass. There is no biliary dilatation. Pancreas: Partially atrophic.  Otherwise unremarkable. Spleen: No splenic injury or perisplenic hematoma. Adrenals/Urinary Tract: There is no adrenal mass or hemorrhage. There is no renal mass enhancement or hemorrhage.  Bilateral nonobstructive nephrolithiasis. Largest stone is 1 cm on the right. Perinephric stranding is unchanged. There is no hydronephrosis, ureteral stone or bladder thickening. Stomach/Bowel: The stomach and unopacified small bowel are unremarkable. An appendix is not seen. There is mild wall thickening versus underdistention in the ascending colon. There is mild fecal stasis and colonic interposition of the hepatic flexure in front of the liver. Scattered sigmoid diverticula without evidence of diverticulitis. Vascular/Lymphatic: Aortic atherosclerosis. No enlarged abdominal or pelvic lymph nodes. Stable asymmetry of ectasia in the left common iliac artery which measures 1.7 cm. Reproductive: No prostatomegaly. Other: Right gluteal subcutaneous hematoma measuring 9.3 x 3.8 cm on 4:98, at a similar level previously having measured 9.1 x 3.6 cm therefore only slightly larger. No active hemorrhaging vessel is seen within the collection. Stranding in the bilateral lateral upper thighs is again also noted greater on the right. There is no free fluid, free air, free hemorrhage or incarcerated hernia. Musculoskeletal: There is a mild superior endplate anterior wedge compression fracture of the L1 vertebral body, but this was seen previously. There is mild osteopenia and advanced degenerative change of the lumbar spine. No acute regional osseous abnormality is seen. Ankylosis across the anterior right SI joint. Multilevel lumbar foraminal stenosis. IMPRESSION: 1. No acute intracranial CT findings or depressed skull fractures. Atrophy and small-vessel disease. 2. Bilateral proptosis with proliferated intraorbital fat, but without extraocular muscle thickening. This was seen previously and could be due to obesity, chronic corticosteroid use, thyroid ophthalmopathy or Graves' disease. 3. Cervical degenerative changes without evidence of fractures or listhesis. 4. 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 Aortic and  coronary artery atherosclerosis. No findings of acute CHF. 5. Intubated, with retained secretions in the distal trachea. 6. No acute trauma related findings in the chest, abdomen or pelvis. 7. Right gluteal subcutaneous hematoma measuring 9.3 x 3.8 cm, only slightly larger than on 06/22/2021. No active hemorrhaging vessel is seen within the hematoma. 8. Ascending colitis versus nondistention. Constipation and diverticulosis. 9. Nonobstructive nephrolithiasis. 10. Osteopenia and degenerative change without acute regional skeletal abnormality. 11. Results were called to Dr. Chelsea Primus at 4:39 a.m., 02/02/2022 immediately after review of available images. Aortic Atherosclerosis (ICD10-I70.0). Electronically Signed   By: Telford Nab M.D.   On: 02/02/2022 05:37   DG  Pelvis Portable  Result Date: 02/02/2022 CLINICAL DATA:  Trauma. EXAM: PORTABLE PELVIS 1-2 VIEWS COMPARISON:  01/22/2022 FINDINGS: There is no evidence of pelvic fracture or diastasis. No pelvic bone lesions are seen. Degenerative changes noted within both hips and lumbar spine. IMPRESSION: 1. No acute findings. 2. Degenerative joint disease. Electronically Signed   By: Kerby Moors M.D.   On: 02/02/2022 05:04   DG Chest Port 1 View  Result Date: 02/02/2022 CLINICAL DATA:  Fall injury. Level 1 trauma. Ventilator dependent respiratory failure. EXAM: PORTABLE CHEST 1 VIEW COMPARISON:  Portable chest 01/22/2023 a. FINDINGS: The lungs are clear. No pneumothorax or pleural effusion are seen. There is mild cardiomegaly with old CABG. No vascular congestion is evident. ETT is in place with tip 3 cm from carina. Multiple overlying monitor wires. No acute osseous abnormality is visible. IMPRESSION: No acute radiographic chest findings. Mild cardiomegaly with old CABG. ETT in place. Electronically Signed   By: Telford Nab M.D.   On: 02/02/2022 04:55   [place] LT UPPER EXTREM LTD SOFT TISSUE NON VASCULAR  Result Date: 01/23/2022 CLINICAL DATA:  Left  arm bruise/hematoma in the antecubital region. EXAM: ULTRASOUND LEFT UPPER EXTREMITY LIMITED TECHNIQUE: Ultrasound examination of the upper extremity soft tissues was performed in the area of clinical concern. COMPARISON:  None Available. FINDINGS: No cystic lesion or soft tissue mass identified. There is likely some infiltrative edema in the left antecubital fossa for example on image 7 of series 1 -1, but no drainable collection. IMPRESSION: 1. No cystic lesion or soft tissue mass identified in the left antecubital fossa. There is likely some edema in the left antecubital fossa, but no drainable collection. Electronically Signed   By: Van Clines M.D.   On: 01/23/2022 17:45   CT Angio Abd/Pel W and/or Wo Contrast  Result Date: 01/22/2022 CLINICAL DATA:  Hematoma in right buttock on Coumadin EXAM: CTA ABDOMEN AND PELVIS WITHOUT AND WITH CONTRAST TECHNIQUE: Multidetector CT imaging of the abdomen and pelvis was performed using the standard protocol during bolus administration of intravenous contrast. Multiplanar reconstructed images and MIPs were obtained and reviewed to evaluate the vascular anatomy. RADIATION DOSE REDUCTION: This exam was performed according to the departmental dose-optimization program which includes automated exposure control, adjustment of the mA and/or kV according to patient size and/or use of iterative reconstruction technique. CONTRAST:  127mL OMNIPAQUE IOHEXOL 350 MG/ML SOLN COMPARISON:  CT abdomen and pelvis 01/20/2020 FINDINGS: VASCULAR Aorta: Calcified athero sclerotic plaque without significant narrowing. No aneurysm or dissection. Celiac: Atherosclerotic plaque at the origin causes moderate narrowing. No aneurysm or dissection. Normal variant origin of the left gastric directly from the aorta. SMA: Patent without evidence of aneurysm, dissection, vasculitis or significant stenosis. Renals: Both renal arteries are patent without evidence of aneurysm, dissection, vasculitis,  fibromuscular dysplasia or significant stenosis. IMA: Patent without evidence of aneurysm, dissection, vasculitis or significant stenosis. 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 Calcified athero sclerotic plaque in both common, internal, and external iliac arteries. No dissection. Proximal Outflow: Bilateral common femoral and visualized portions of the superficial and profunda femoral arteries are patent without evidence of aneurysm, dissection, vasculitis or significant stenosis. Veins: Unremarkable. Review of the MIP images confirms the above findings. NON-VASCULAR Lower chest: Bibasilar scarring/atelectasis.  No acute abnormality. Hepatobiliary: Hepatic steatosis. No focal liver abnormality is seen. No gallstones, gallbladder wall thickening, or biliary dilatation. Pancreas: Unremarkable. No pancreatic ductal dilatation or surrounding inflammatory changes. Spleen: Normal in size without focal abnormality. Adrenals/Urinary  Tract: Unremarkable adrenal glands. Bilateral nephrolithiasis measuring up to 1.0 cm right. No hydronephrosis or obstructing urinary calculi. Unremarkable bladder. Stomach/Bowel: Normal caliber large and small bowel. Moderate colonic stool burden. Unremarkable stomach. Lymphatic: Unchanged bilateral external iliac lymphadenopathy measuring up to 1.4 cm on the right. Reproductive: Unremarkable. Other: No free intraperitoneal fluid or air. Musculoskeletal: Edema or contusion about the bilateral posterior gluteal subcutaneous fat. Within the right gluteal subcutaneous fat there is a hyperdense fluid collection measuring approximately 9.3 by 3.7 cm. No evidence of active contrast extravasation. This does not change in density between phases. Thoracolumbar spondylosis. No acute osseous abnormality. IMPRESSION: VASCULAR Hematoma in the right gluteal subcutaneous fat. No evidence of active bleeding. No acute aortic syndrome.  Aortic Atherosclerosis (ICD10-I70.0). Ectasia of  the left common iliac artery measuring 1.7 cm. NON-VASCULAR No acute abnormality in the abdomen or pelvis. Bilateral nonobstructing nephrolithiasis measuring up to 1.0 cm on the right. Hepatic steatosis. Electronically Signed   By: Placido Sou M.D.   On: 01/22/2022 03:51   DG Chest 1 View  Result Date: 01/21/2022 CLINICAL DATA:  Loss of consciousness and low oxygen saturation; history of COPD EXAM: CHEST  1 VIEW COMPARISON:  Radiograph 11/08/2018 FINDINGS: Stable cardiomegaly.  Sternotomy and CABG. Increased interstitial coarsening compared with 11/07/2021. Bibasilar atelectasis. Question trace bilateral pleural effusions. No pneumothorax. No acute osseous abnormality. IMPRESSION: Cardiomegaly with possible mild interstitial edema. Question trace bilateral pleural effusions. Electronically Signed   By: Placido Sou M.D.   On: 01/21/2022 20:38    EKG:11/30: SR at 76bpm, RBBB(personally reviewed)  TELEMETRY: SR with rates in 60-80s with rare PVCs; no significant pauses (personally reviewed)  Assessment/Plan: 1.  Syncope Workup to date has not found cause of patient's syncope Low flow low gradient aortic stenosis thought to be cause, though LHC does not support this LVEF 55-60% by echo, no LV wall motion abnormalities  Wore a zio for 3 days in 2022, was to be worn for 1 month  Given that the patient is influenza A positive, recommend 2 week zio at discharge with plans to discuss ILR in office if ambulatory monitor is inconclusive Encouraged patient to press button when has syncope events.   Dr. Lovena Le has seen.  Patient prefers to be seen in [place].  Outpatient appt scheduled.   CAD s/p CABG Influenza A Positive COPD on home oxygen - all per primary    EP to sign off at this time, but remains available. Please re-consult if needed.   For questions or updates, please contact [place] Please consult [email or web address] for contact info under  Cardiology/STEMI.  Signed, Mamie Levers, NP  02/16/2022 7:49 AM  EP Attending  Patient seen and examined. He makes me a difficult historian. The patient has a h/o unexplained syncope and preserved LV function with prior CABG. He has mod-severe AS. His syncopal spells are characterized by a warning of 20-30 seconds. No palpitations. When he awakens he denies nausea/vominting or diaphoresis. The patient denies a loss of continence. He has been diagnosed with acute influenza A but does not appear particularly ill. His exam is accurately documented above and reflects my findings. I would recommend Zio monitor and followup with Dr. Renaldo Reel or CL in [place]. If the zio is unrevealing then an ILR would be warranted.  Carleene Overlie Lititia Sen,MD

## 2022-02-17 DIAGNOSIS — J449 Chronic obstructive pulmonary disease, unspecified: Secondary | ICD-10-CM

## 2022-02-17 DIAGNOSIS — R55 Syncope and collapse: Secondary | ICD-10-CM | POA: Diagnosis not present

## 2022-02-17 DIAGNOSIS — S066XAA Traumatic subarachnoid hemorrhage with loss of consciousness status unknown, initial encounter: Secondary | ICD-10-CM | POA: Diagnosis not present

## 2022-02-17 DIAGNOSIS — I6523 Occlusion and stenosis of bilateral carotid arteries: Secondary | ICD-10-CM

## 2022-02-17 DIAGNOSIS — Z7901 Long term (current) use of anticoagulants: Secondary | ICD-10-CM | POA: Diagnosis not present

## 2022-02-17 DIAGNOSIS — W19XXXA Unspecified fall, initial encounter: Secondary | ICD-10-CM | POA: Diagnosis not present

## 2022-02-17 LAB — CBC
HCT: 28.8 % — ABNORMAL LOW (ref 39.0–52.0)
Hemoglobin: 8.6 g/dL — ABNORMAL LOW (ref 13.0–17.0)
MCH: 27.7 pg (ref 26.0–34.0)
MCHC: 29.9 g/dL — ABNORMAL LOW (ref 30.0–36.0)
MCV: 92.6 fL (ref 80.0–100.0)
Platelets: 157 10*3/uL (ref 150–400)
RBC: 3.11 MIL/uL — ABNORMAL LOW (ref 4.22–5.81)
RDW: 16.5 % — ABNORMAL HIGH (ref 11.5–15.5)
WBC: 6.1 10*3/uL (ref 4.0–10.5)
nRBC: 0 % (ref 0.0–0.2)

## 2022-02-17 LAB — GLUCOSE, CAPILLARY
Glucose-Capillary: 108 mg/dL — ABNORMAL HIGH (ref 70–99)
Glucose-Capillary: 129 mg/dL — ABNORMAL HIGH (ref 70–99)
Glucose-Capillary: 140 mg/dL — ABNORMAL HIGH (ref 70–99)
Glucose-Capillary: 138 mg/dL — ABNORMAL HIGH (ref 70–99)

## 2022-02-17 LAB — COMPREHENSIVE METABOLIC PANEL
ALT: 28 U/L (ref 0–44)
AST: 33 U/L (ref 15–41)
Albumin: 2.9 g/dL — ABNORMAL LOW (ref 3.5–5.0)
Alkaline Phosphatase: 61 U/L (ref 38–126)
Anion gap: 9 (ref 5–15)
BUN: 17 mg/dL (ref 8–23)
CO2: 28 mmol/L (ref 22–32)
Calcium: 8.8 mg/dL — ABNORMAL LOW (ref 8.9–10.3)
Chloride: 103 mmol/L (ref 98–111)
Creatinine, Ser: 0.84 mg/dL (ref 0.61–1.24)
GFR, Estimated: >60 mL/min (ref >60)
Glucose, Bld: 112 mg/dL — ABNORMAL HIGH (ref 70–99)
Potassium: 3.9 mmol/L (ref 3.5–5.1)
Sodium: 140 mmol/L (ref 135–145)
Total Bilirubin: 0.5 mg/dL (ref 0.3–1.2)
Total Protein: 6.7 g/dL (ref 6.5–8.1)

## 2022-02-17 LAB — PROCALCITONIN: Procalcitonin: <0.1 ng/mL

## 2022-02-17 MED ORDER — PANTOPRAZOLE SODIUM 40 MG PO TBEC
40.0000 mg | DELAYED_RELEASE_TABLET | Freq: Two times a day (BID) | ORAL | Status: DC
  Administered 2022-02-17 – 2022-02-19 (×4): 40 mg via ORAL
  Filled 2022-02-17 (×4): qty 1

## 2022-02-17 MED ORDER — WARFARIN - PHARMACIST DOSING INPATIENT: Freq: Every day | Status: DC

## 2022-02-17 MED ORDER — WARFARIN SODIUM 4 MG PO TABS
6.0000 mg | ORAL_TABLET | Freq: Once | ORAL | Status: AC
  Administered 2022-02-17: 6 mg via ORAL
  Filled 2022-02-17: qty 1

## 2022-02-17 MED ORDER — LACTULOSE 10 GM/15ML PO SOLN
10.0000 g | Freq: Two times a day (BID) | ORAL | Status: DC
  Administered 2022-02-17 – 2022-02-19 (×3): 10 g via ORAL
  Filled 2022-02-17 (×4): qty 15

## 2022-02-17 MED ORDER — ENOXAPARIN SODIUM 40 MG/0.4ML IJ SOSY
40.0000 mg | PREFILLED_SYRINGE | INTRAMUSCULAR | Status: DC
  Administered 2022-02-17 – 2022-02-19 (×3): 40 mg via SUBCUTANEOUS
  Filled 2022-02-17 (×3): qty 0.4

## 2022-02-17 NOTE — Progress Notes (Signed)
Palliative Care Progress Note, Assessment & Plan   Patient Name: Jose [name]       Date: 02/17/2022 DOB: 03/15/1956  Age: 66 y.o. MRN#: [id number] Attending Physician: Lanae Boast, MD Primary Care Physician: [name], MD Admit Date: 02/12/2022  Reason for Consultation/Follow-up: Establishing goals of care  Subjective: Patient is out of bed and in chair.  He acknowledges my presence and is able to make his wishes known.  He has no acute complaints at the time.  He shares he did not sleep until about 530 this morning.  However, he is hopeful to take a nap today.  No family or friends present at bedside.  HPI: 66 y.o. male  with past medical history of CAD admitted on 02/12/2022 with CAD status post CABG, HLD, aortic valve stenosis, antiphospholipid syndrome (chronic AC-Coumadin), COPD (4-6 L baseline, trilogy-compliance complicated by mask fitting issues), PE (2011), type 2 diabetes, vertebrobasilar insufficiency, OSA, and recurrent syncope admitted on 11/30 with fall from home.    He was hospitalized for similar at [facility] earlier in November.    CT revealed small area of subarachnoid hemorrhage.    12/3, pt had increased confusion and repeat CT scan revealed no change from prior.    PMT was consulted to discuss GOC.  Summary of counseling/coordination of care: After reviewing the patient's chart and assessing the patient at bedside, I discussed diagnosis, prognosis, and goals of care with patient.  Patient has not reviewed the advance directive or MOST form left at bedside yesterday.  He shares that he wants to be able to speak with his wife about it before completing it.  He says he is very interested in completing a living will and making his wishes known prior to any emergent emergent or urgent  event.  However, he is not prepared to discuss it today.  Reviewed importance of advance care planning documentation and making wishes known to his surrogate decision makers.  I highlighted that whether or not patient completes the paperwork that perhaps initiating and continuing a discussion with his wife and family to make his wishes known would be in his best interest.  Patient was in agreement.  Patient has no other complaints at this time.  He is looking forward to being discharged soon.  Patient has PMT contact information and was encouraged to call with any palliative needs.  PMT will shadow the patient's chart and monitor him peripherally.  Please reengage with PMT if patient's goals change, at patient or family's request, or if patient's health deteriorates during hospitalization.  Full code and full scope remain.  Physical Exam Vitals reviewed.  Constitutional:      General: He is not in acute distress.    Appearance: He is obese. He is not ill-appearing.  HENT:     Head: Normocephalic.     Mouth/Throat:     Mouth: Mucous membranes are moist.  Eyes:     Pupils: Pupils are equal, round, and reactive to light.  Cardiovascular:     Rate and Rhythm: Normal rate.     Pulses: Normal pulses.  Pulmonary:     Effort: Pulmonary effort is normal.     Comments: [place] in place  Abdominal:     Palpations: Abdomen is soft.  Musculoskeletal:        General: Normal range of motion.     Comments: MAETC  Skin:    General: Skin is warm and dry.  Neurological:     Mental Status: He is alert and oriented to person, place, and time.  Psychiatric:        Mood and Affect: Mood normal.        Behavior: Behavior normal.        Thought Content: Thought content normal.        Judgment: Judgment normal.             Palliative Assessment/Data: 60%    Total Time 25 minutes  Greater than 50%  of this time was spent counseling and coordinating care related to the above assessment and  plan.  Thank you for allowing the Palliative Medicine Team to assist in the care of this patient.  Samara Deist L. Manon Hilding, FNP-BC Palliative Medicine Team Team Phone # [phone]

## 2022-02-17 NOTE — Progress Notes (Signed)
Ambulatory PROGRESS NOTE Jose [name]  XBJ:[id number] DOB: 12/16/1955 DOA: 02/12/2022 PCP: Elenore Paddy, MD   Brief Narrative/Hospital Course: 66 y.o. 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   [facility] 11/20-11/24  s/p fall/syncopal episode at home acutely altered requiring intubation and admitted into the ICU, also treated for MRSA aspiration pneumonia.  Possible seizure though not captured on EEG.Dr. Leonel Ramsay note 11/24 very thoughtfully explains patient's prodrome before falling which is less consistent with seizure more likely syncope of other etiology, his posterior circulation stenosis not felt likely to cause to vertebral artery insufficiency. >Presented to [facility] ED 11/30 secondary to fall at home leaving a dent on refrigerator.   In ED: afebrile with pulse 74-107, respirations 13-25, blood pressure 102/45-147/71, and O2 saturating well on home oxygen 4 L  CT scan of the head, cervical spine, chest, abdomen, pelvis, and lumbar spine DONE: CT scan of the head noted small area of subarachnoid hemorrhage at the superior medial right convexity.  Labs were noted to be stable.  Neurosurgery have been consulted and recommended serial CT exams. The next CT scan of the brain noted slight interval increase in size of the small subarachnoid hemorrhage without mass effect or midline shift.  Cardiology was  consulted for syncope. Patient admitted for further management. Initially felt AS as the etiology for syncope so had cardiac cath 02/13/22 Multiple falls of unclear etiology cardiology does not recommend TAVR 12/3 AM worsening mental status febrile leukocytosis ABG with CO2 retention in the setting of noncompliance with BiPAP while placed on BiPAP also tested positive for influenza repeat CT head with stable bleed 12/4: Repeat CT head no progression of frontal bleeding.  Mentation improved.  Subjective:  Overnight afebrile Reports he used his BiPAP, currently resting on recliner  Does not feel ready for home today complains of being very weak would like to work with PT OT  Assessment and Plan:  Small traumatic SAH Right frontal contusion: Patient with traumatic right frontal lobe hemorrhage, subarachnoid hemorrhage> seen by neurosurgery CT head 12/3/ 12/4: No progression of frontal bleeding.  Coumadin held x5 days, I discussed with Dr. Ronnald Ramp okay to resume Coumadin without therapeutic Lovenox but advised prophylactic Lovenox while INR subtherapeutic.  Patient agreeable with the plan understanding risk benefits- pharmacy consulted to start Coumadin. He will need follow-up with [facility] within 2 weeks with repeat CT head   Recurrent fall episodes of unclear etiology Cardiogenic syncope versus combination of deconditioning and gait instability Moderate aortic stenosis Asymptomatic B/L carotid artery stenosis: Seen by Dr. Einar Gip > I had extensive discussion with him-he reviewed all his previous work-up including at [facility], several scans echocardiogram : his  aortic stenosis is moderate after cardiac cath  so TAVR will not be effective in preventing his recurrent fall, episodes of fall where he is completely conscious, he feels weak in his legs and gets down to the ground and sometimes he also states that he falls he does not remember how he fell. VBI  ruled out on recent  admission-bilateral antegrade vertebral artery flow +.  CAD appears stable.  May have a component of cardiogenic syncope related to conduction system disease versus deconditioning in the setting of his complex medical comorbidities/obesity/chronic hypoxia leg edema. Due to flu: not doing loop recorder> they are planning Ziopatch on d/c and then loop recorder if needed.Will need surveillance carotid artery duplex.Of note he had a CABG following which for almost 2 years he was bedbound and nearly bedbound.  Cont ptot  Acute metabolic encephalopathy: Suspect multifactorial with hypercarbia metabolic acidosis, influenza.Repeat CT x2 with stable bleed. Resolved w/ BiPAP> continue nightly and as needed, ammonia is elevated. Cont lactulose  bid, recheck in am. Appreciate PCCM input.  Influenza A :Fever up to 101.3 02/15/22: respiratory panel is positive for influenza: Started Tamiflu 12/3.  Procalcitonin less than 0.1 reassuring, blood culture  12/3:NGTD. chest x-ray no acute finding.  Leukocytosis improving.  COPD with chronic respiratory failure-on 4 to 6 [place] Morbid obesity with OHS-on home trilogy Unclear how much he is compliant with trilogy.Refused BiPAP 12/3 night> had worsening hypercarbia and mental status placed on BiPAP> now improved,  seen by pulmonary.  Continue supplemental oxygen daytime.Continue inhalers-breo-incruse/nebulizer and monitor respiratory status.  Ant 7th rib fracture on CT chest: Continue pain control  CAD, CABG x3: No chest pain.  Continue statin metoprolol.Per Dr. Ronnald Ramp from neurosurgery I have discussed this morning> okay to resume aspirin and Coumadin  Essential hypertension:BP stable/soft on metoprolol. HLD: Cont statin Asymptomatic bilateral carotid artery stenosis:carotid duplex reviewed from 12/06/2020, 50 to 69% stenosis right and left <15 to 49% stenosis with <50% stenosis of the left external carotid artery. B/L antegrade vertebra   Chronic diastolic CHF Chronic  hyperpigmented hyperkeratotic lower extremities: Volume status is stable well, continue home torsemide> BNP 318. Net IO Since Admission: -5,340.33 mL [02/17/22 1412]  Filed Weights   02/12/22 0846  Weight: 107 kg    Recent aspiration pneumonia: Was recently hospitalized at [facility]> completed Zyvox 12/1.  Chest x-ray 12/3 is clear  Normocytic anemia: Hb b/l: 9 to 10 g.  Stable in mid 8s.Recently had PRBC transfusion in mid nov. Recent Labs  Lab 02/13/22 1455 02/14/22 0110 02/15/22 0805 02/16/22 0136 02/17/22 0231  HGB 8.8* 7.8* 8.9* 8.6* 8.6*  HCT 26.0* 26.8* 30.1* 29.2* 28.8*    BPH: on Flomax   History of PE in 2011 History of antiphospholipid antibody syndrome on chronic Coumadin: Goal as low as 2.0 due to recurrent falls- was seen by Dr Tonita Phoenix on 01/23/22 at [facility]- there is a concern about his antiphospholipid Dx> repeat blood work was done and possible consideration to transition to [place] due to low risk Dx>  plan was to follow up outpatient with him w/. APS labs- came back at Anticardiolipin IgG 26 ( normal 0-14), Anticardiolopin IgM normal, hexagonal phase phospholipid up 46 ( 0-11 normal), dRVVT Mix 62 ( nl 0-40.4s), dDRVT Confirm up 2.0 ( nl 0.8-1.2), beta2 glycoprotein igG,I gM normal, lupus anticoagulant consistent with lupus anticoagulant.CTA PE 11/30: neg for PE.  Patient reported that he is not planning to switch to other blood thinner, and is also not planning to follow-up with hematology.  I have presented with him that he has very limited option - he will need anticoagulation at the same time at risk of traumatic bleeding given his recurrent fall> this was also discussed with patient's daughter.  Neurosurgery advised to hold anticoagulation Coumadin x5 days: resuming 02/17/22 with low dose lovenox  Recent Labs  Lab 02/12/22 0035 02/12/22 2049 02/13/22 0710 02/14/22 1839  INR 2.2* 2.2* 1.8* 1.3*     T2DM: Poorly controlled blood sugar in 200, last A1c normal 5.6 in  02/02/2022.  Diet controlled- on  SSI.Holding OHA. Recent Labs  Lab 02/16/22 0623 02/16/22 1310 02/16/22 1732 02/17/22 0751 02/17/22 1145  GLUCAP 118* 95 96 108* 129*     Class II Obesity:Patient's Body mass index is 36.95 kg/m. : Will benefit with PCP follow-up, weight loss  healthy lifestyle and outpatient sleep evaluation.  Goals of care: Currently full code.  Patient has multiple complex severe comorbidities> overall prognosis does not appear bright.  He is in difficult condition due to his need for anticoagulation with Coumadin and at the same time having multiple syncopal episodes leading to recurrent admissions and brain bleed, ocne intubation recently. Palliative care consulted to discuss CODE STATUS.  I have shared my concern with patient as well as his daughter.  Overall prognosis is not bright in this unfortunate gentleman. DVT prophylaxis: enoxaparin (LOVENOX) injection 40 mg Start: 02/17/22 1100 Place and maintain sequential compression device Start: 02/12/22 1734 SCDs Start: 02/12/22 1112 Code Status:   Code Status: Full Code Family Communication: plan of care discussed with patient at bedside.  Updated daughter. I had met multiple attempts to call his wife on the phone no answer unable to leave voice message  Patient status is: inpatient due to acute metabolic encephalopathy intracranial bleeding   Level of care: Progressive  Dispo: The patient is from: home            Anticipated disposition: home w/ home health  Objective: Vitals last 24 hrs: Vitals:   02/17/22 0430 02/17/22 0815 02/17/22 0847 02/17/22 1137  BP: (!) 105/54 120/63  (!) 110/49  Pulse: 73 78  73  Resp: _0 Temp: 98.8 F (37.1 C) 98.3 F (36.8 C)  97.9 F (36.6 C)  TempSrc: Oral Oral  Oral  SpO2: 100% 93% 93% 98%  Weight:      Height:       Weight change:   Physical Examination: General exam: AAo,obese, on recliner HEENT:Oral mucosa moist, Ear/Nose WNL grossly, dentition  normal. Respiratory system: bilaterally diminished BS, no use of accessory muscle Cardiovascular system: S1 & S2 +, regular rate, JVD neg. Gastrointestinal system: Abdomen soft, obese, NT,BS+ Nervous System:Alert, awake, moving extremities and grossly nonfocal Extremities: LE ankle edema negative but chronic hyperpigmentation and thickening skin changes, lower extremities warm Skin: No rashes,no icterus. BWL:SLHTDS muscle bulk,tone, power   Medications reviewed:  Scheduled Meds:  DULoxetine  90 mg Oral Daily   enoxaparin (LOVENOX) injection  40 mg Subcutaneous Q24H   fluticasone furoate-vilanterol  1 puff Inhalation Daily   And   umeclidinium bromide  1 puff Inhalation Daily   gabapentin  600 mg Oral BID   insulin aspart  0-6 Units Subcutaneous Q6H   lactulose  10 g Oral Daily   metoprolol tartrate  12.5 mg Oral BID   oseltamivir  75 mg Oral BID   pantoprazole  40 mg Oral BID   rosuvastatin  10 mg Oral QPM   sodium chloride flush  3 mL Intravenous Q12H   sodium chloride flush  3 mL Intravenous Q12H   tamsulosin  0.4 mg Oral QHS   topiramate  75 mg Oral TID   torsemide  20 mg Oral Daily   warfarin  6 mg Oral ONCE-1600   Warfarin - Pharmacist Dosing Inpatient   Does not apply [id number]  Continuous Infusions:  sodium chloride      Diet Order             Diet Heart Room service appropriate? Yes with Assist; Fluid consistency: Thin; Fluid restriction: 1500 mL Fluid  Diet effective now                  Intake/Output Summary (Last 24 hours) at 02/17/2022 1412 Last data filed at 02/17/2022 1145 Gross per 24 hour  Intake 240 ml  Output 1600 ml  Net -1360 ml    Net IO Since Admission: -5,340.33 mL [02/17/22 1412]  Wt Readings from Last 3 Encounters:  02/12/22 107 kg  02/05/22 107.8 kg  01/22/22 111.7 kg     Unresulted Labs (From admission, onward)     Start     Ordered   02/18/22 0500  Protime-INR  Daily,   R     Question:  Specimen collection method  Answer:  IV  Team=IV Team collect   02/17/22 1038   02/16/22 0500  CBC  Daily,   R      02/15/22 0957   02/16/22 0500  Comprehensive metabolic panel  Daily,   R      02/15/22 0957          Data Reviewed: I have personally reviewed following labs and imaging studies CBC: Recent Labs  Lab 02/13/22 0148 02/13/22 1446 02/13/22 1455 02/14/22 0110 02/15/22 0805 02/16/22 0136 02/17/22 0231  WBC 9.7  --   --  9.0 14.1* 8.3 6.1  HGB 9.2*   < > 8.8* 7.8* 8.9* 8.6* 8.6*  HCT 32.1*   < > 26.0* 26.8* 30.1* 29.2* 28.8*  MCV 96.4  --   --  96.4 92.6 93.9 92.6  PLT 253  --   --  234 204 179 157   < > = values in this interval not displayed.    Basic Metabolic Panel: Recent Labs  Lab 02/13/22 0148 02/13/22 1446 02/13/22 1455 02/14/22 0110 02/15/22 0805 02/16/22 0136 02/17/22 0231  NA 139   < > 142 140 133* 140 140  K 4.5   < > 4.0 3.9 4.1 3.9 3.9  CL 104  --   --  107 100 103 103  CO2 27  --   --  _0 GLUCOSE 137*  --   --  130* 155* 136* 112*  BUN 10  --   --  _1 CREATININE 0.92  --   --  0.95 0.84 0.70 0.84  CALCIUM 8.6*  --   --  8.2* 8.6* 8.7* 8.8*   < > = values in this interval not displayed.    GFR: Estimated Creatinine Clearance: 100.9 mL/min (by C-G formula based on SCr of 0.84 mg/dL). Liver Function Tests: Recent Labs  Lab 02/12/22 0035 02/15/22 1139 02/16/22 0136 02/17/22 0231  AST _2 33  ALT _3 ALKPHOS 66 61 69 61  BILITOT 0.3 0.2* 0.5 0.5  PROT 7.1 7.1 7.1 6.7  ALBUMIN 3.3* 3.1* 3.1* 2.9*    No results for input(s): "LIPASE", "AMYLASE" in the last 168 hours. Recent Labs  Lab 02/15/22 1133 02/16/22 0136  AMMONIA 47* 64*    Coagulation Profile: Recent Labs  Lab 02/12/22 0035 02/12/22 2049 02/13/22 0710 02/14/22 1839  INR 2.2* 2.2* 1.8* 1.3*    Recent Results (from the past 240 hour(s))  Respiratory (~20 pathogens) panel by PCR     Status:  Abnormal   Collection Time: 02/15/22 11:14 AM   Specimen: Nasopharyngeal  Swab; Respiratory  Result Value Ref Range Status   Adenovirus NOT DETECTED NOT DETECTED Final   Coronavirus 229E NOT DETECTED NOT DETECTED Final    Comment: (NOTE) The Coronavirus on the Respiratory Panel, DOES NOT test for the novel  Coronavirus (2019 nCoV)    Coronavirus HKU1 NOT DETECTED NOT DETECTED Final   Coronavirus NL63 NOT DETECTED NOT DETECTED Final   Coronavirus OC43 NOT DETECTED NOT DETECTED Final   Metapneumovirus NOT DETECTED NOT DETECTED Final   Rhinovirus / Enterovirus NOT DETECTED NOT DETECTED Final   Influenza A H1 2009 DETECTED (A) NOT DETECTED Final   Influenza B NOT DETECTED NOT DETECTED Final   Parainfluenza Virus 1 NOT DETECTED NOT DETECTED Final   Parainfluenza Virus 2 NOT DETECTED NOT DETECTED Final   Parainfluenza Virus 3 NOT DETECTED NOT DETECTED Final   Parainfluenza Virus 4 NOT DETECTED NOT DETECTED Final   Respiratory Syncytial Virus NOT DETECTED NOT DETECTED Final   Bordetella pertussis NOT DETECTED NOT DETECTED Final   Bordetella Parapertussis NOT DETECTED NOT DETECTED Final   Chlamydophila pneumoniae NOT DETECTED NOT DETECTED Final   Mycoplasma pneumoniae NOT DETECTED NOT DETECTED Final    Comment: Performed at [facility], [place] [address] [place], [place] [address]  Culture, blood (Routine X 2) w Reflex to ID Panel     Status: None (Preliminary result)   Collection Time: 02/15/22 11:33 AM   Specimen: BLOOD  Result Value Ref Range Status   Specimen Description BLOOD LEFT ANTECUBITAL  Final   Special Requests   Final    BOTTLES DRAWN AEROBIC AND ANAEROBIC Blood Culture results may not be optimal due to an inadequate volume of blood received in culture bottles   Culture   Final    NO GROWTH 2 DAYS Performed at [facility], [place] [address] [employer] [employer] [place], [place] [address]    Report Status PENDING  Incomplete  Culture, blood (Routine X 2) w Reflex to ID Panel     Status: None (Preliminary result)   Collection Time: 02/15/22 11:33 AM    Specimen: BLOOD  Result Value Ref Range Status   Specimen Description BLOOD RIGHT ANTECUBITAL  Final   Special Requests   Final    BOTTLES DRAWN AEROBIC AND ANAEROBIC Blood Culture results may not be optimal due to an inadequate volume of blood received in culture bottles   Culture   Final    NO GROWTH 2 DAYS Performed at [facility], [place] [address] [place], [place] [address]    Report Status PENDING  Incomplete    Antimicrobials: Anti-infectives (From admission, onward)    Start     Dose/Rate Route Frequency Ordered Stop   02/15/22 1430  oseltamivir (TAMIFLU) capsule 75 mg        75 mg Oral 2 times daily 02/15/22 1340 02/20/22 0959   02/12/22 2200  linezolid (ZYVOX) tablet 600 mg  Status:  Discontinued        600 mg Oral 2 times daily 02/12/22 1728 02/12/22 1739   02/12/22 2200  linezolid (ZYVOX) tablet 600 mg        600 mg Oral 2 times daily 02/12/22 1739 02/13/22 0912      Culture/Microbiology    Component Value Date/Time   SDES BLOOD LEFT ANTECUBITAL 02/15/2022 1133   SDES BLOOD RIGHT ANTECUBITAL 02/15/2022 1133   SPECREQUEST  02/15/2022 1133    BOTTLES DRAWN AEROBIC AND  ANAEROBIC Blood Culture results may not be optimal due to an inadequate volume of blood received in culture bottles   SPECREQUEST  02/15/2022 1133    BOTTLES DRAWN AEROBIC AND ANAEROBIC Blood Culture results may not be optimal due to an inadequate volume of blood received in culture bottles   CULT  02/15/2022 1133    NO GROWTH 2 DAYS Performed at [facility] [address], [place], [place] [address]    CULT  02/15/2022 1133    NO GROWTH 2 DAYS Performed at [facility] [address] [place], [place] [address]    REPTSTATUS PENDING 02/15/2022 1133   REPTSTATUS PENDING 02/15/2022 1133   Radiology Studies: CT HEAD WO CONTRAST (5MM)  Result Date: 02/16/2022 CLINICAL DATA:  TBI, follow-up ICH EXAM: CT HEAD WITHOUT CONTRAST TECHNIQUE: Contiguous axial images were obtained from the  base of the skull through the vertex without intravenous contrast. RADIATION DOSE REDUCTION: This exam was performed according to the departmental dose-optimization program which includes automated exposure control, adjustment of the mA and/or kV according to patient size and/or use of iterative reconstruction technique. COMPARISON:  Head CT from yesterday FINDINGS: Brain: No change in the parenchymal hemorrhage in the high and anterior right frontal lobe with local subarachnoid spread and mild edema in the adjacent brain. The hematoma measures up to 2.2 cm. No mass effect or new hemorrhage. No evidence of infarct or hydrocephalus. Vascular: No hyperdense vessel or unexpected calcification. Skull: Normal. Negative for fracture or focal lesion. Sinuses/Orbits: No acute finding. IMPRESSION: No progression of the right frontal hemorrhage Electronically Signed   By: Jorje Guild M.D.   On: 02/16/2022 05:27     LOS: 1 day   Antonieta Pert, MD [facility]  02/17/2022, 2:12 PM

## 2022-02-17 NOTE — Progress Notes (Signed)
ANTICOAGULATION CONSULT NOTE - Initial Consult  Pharmacy Consult for warfarin Indication:   PE/Antiphospholipid Syndrome  Allergies  Allergen Reactions   Melatonin Anxiety   Altace [Ramipril] Cough    Patient Measurements: Height: 5\' 7"  (170.2 cm) Weight: 107 kg (235 lb 14.3 oz) IBW/kg (Calculated) : 66.1  Vital Signs: Temp: 98.3 F (36.8 C) (12/05 0815) Temp Source: Oral (12/05 0815) BP: 120/63 (12/05 0815) Pulse Rate: 78 (12/05 0815)  Labs: Recent Labs    02/14/22 1839 02/15/22 0805 02/15/22 0805 02/16/22 0136 02/17/22 0231  HGB  --  8.9*   < > 8.6* 8.6*  HCT  --  30.1*  --  29.2* 28.8*  PLT  --  204  --  179 157  LABPROT 16.2*  --   --   --   --   INR 1.3*  --   --   --   --   CREATININE  --  0.84  --  0.70 0.84   < > = values in this interval not displayed.    Estimated Creatinine Clearance: 100.9 mL/min (by C-G formula based on SCr of 0.84 mg/dL).   Medical History: Past Medical History:  Diagnosis Date   Antiphospholipid antibody positive    Antiphospholipid antibody syndrome (HCC)    CAD (coronary artery disease)    CAD S/P percutaneous coronary angioplasty '98, '04, Feb 2017   [facility]   Carotid arterial disease (HCC) 10/2015   moderate bilateral   COPD (chronic obstructive pulmonary disease) (HCC)    Coronary artery disease    DM (diabetes mellitus) (HCC)    HTN (hypertension)    Hypertension    Non-insulin treated type 2 diabetes mellitus (HCC)    OSA (obstructive sleep apnea)    Pulmonary emboli (HCC) 2011   RBBB    Sleep apnea    Vertebrobasilar artery insufficiency     Medications:  Medications Prior to Admission  Medication Sig Dispense Refill Last Dose   acetaminophen (TYLENOL) 500 MG tablet Take 1,000 mg by mouth every 6 (six) hours as needed for mild pain.   unknown   albuterol (PROVENTIL) (5 MG/ML) 0.5% nebulizer solution Take 0.5 mLs (2.5 mg total) by nebulization every 6 (six) hours as needed for wheezing or shortness of breath. 20  mL 12 unknown   albuterol (VENTOLIN HFA) 108 (90 Base) MCG/ACT inhaler Inhale 2 puffs into the lungs every 6 (six) hours as needed for wheezing or shortness of breath.   unknown   aspirin EC 81 MG EC tablet Take 1 tablet (81 mg total) by mouth daily. Swallow whole. 30 tablet 11 Past Week   DULoxetine (CYMBALTA) 60 MG capsule Take 60 mg by mouth daily. Take one capsule (60 mg) along with 30 mg capsule to equal 90 mg daily   Past Week   DULoxetine HCl 30 MG CSDR Take 30 mg by mouth daily.   Past Week   Fluticasone-Umeclidin-Vilant (TRELEGY ELLIPTA IN) Inhale 1 puff into the lungs daily.   Past Week   gabapentin (NEURONTIN) 600 MG tablet Take 600 mg by mouth 2 (two) times daily.   02/11/2022   glimepiride (AMARYL) 4 MG tablet Take 4 mg by mouth 2 (two) times daily.   02/11/2022   ipratropium-albuterol (DUONEB) 0.5-2.5 (3) MG/3ML SOLN Take 3 mLs by nebulization every 4 (four) hours as needed (For shortness of breath or wheezing).   unknown   linezolid (ZYVOX) 600 MG tablet Take 1 tablet (600 mg total) by mouth 2 (two) times daily. 6  tablet 0 02/11/2022   metoprolol tartrate (LOPRESSOR) 25 MG tablet Take 1 tab daily for for 4 days then 1 tab every other day for 4 days then stop (Patient taking differently: Take 12.5 mg by mouth 2 (two) times daily.)   02/11/2022 at 0500   nitroGLYCERIN (NITROSTAT) 0.4 MG SL tablet Place 0.4 mg under the tongue every 5 (five) minutes as needed for chest pain.   1 unknown   OXYGEN Inhale 4-6 L into the lungs daily.   02/11/2022   pantoprazole (PROTONIX) 40 MG tablet Take 40 mg by mouth 2 (two) times daily.   02/11/2022   rosuvastatin (CRESTOR) 10 MG tablet Take 10 mg by mouth every evening.  11 Past Week   tamsulosin (FLOMAX) 0.4 MG CAPS capsule Take 1 capsule (0.4 mg total) by mouth at bedtime. 30 capsule 0 02/11/2022   topiramate (TOPAMAX) 50 MG tablet Take 1.5 tablets (75 mg total) by mouth 2 (two) times daily. (Patient taking differently: Take 75 mg by mouth 3 (three)  times daily.) 45 tablet 1 02/11/2022   torsemide (DEMADEX) 20 MG tablet Take 20 mg by mouth daily.   02/11/2022   warfarin (COUMADIN) 5 MG tablet Take 5 mg by mouth every evening.   02/10/2022 at 2200   enoxaparin (LOVENOX) 120 MG/0.8ML injection Inject 0.74 mLs (111 mg total) into the skin every 12 (twelve) hours for 4 days. (Patient not taking: Reported on 02/12/2022) 5.92 mL 0 Completed Course   senna-docusate (SENOKOT-S) 8.6-50 MG tablet Take 1 tablet by mouth 2 (two) times daily as needed for moderate constipation. 60 tablet 0 never picked up    Assessment: 66 yom on chronic warfarin for antiphospholipid syndrome with hx PE admitted with syncope and fall at home. Of note, he does have a hx of recurrent syncope with falls. CT noted small SAH and warfarin was held on admit. Latest CT head 12/4 was without progression of SAH and pharmacy consulted to resume warfarin.  INR was subtherapeutic at 1.3 on 12/2. No bleeding noted, Hgb low stable 8s, platelets are low end of normal. He is also on enoxaparin for prophylaxis.   PTA warfarin regimen: 5 mg daily  Goal of Therapy:  INR 2-3 (previous discussion about aiming on low end given recurrent falls)  Monitor platelets by anticoagulation protocol: Yes   Plan:  Warfarin 6 mg PO tonight Discontinue enoxaparin when INR >=2 INR daily Monitor for s/sx of bleeding  Thank you for involving pharmacy in this patient's care.  Loura Back, PharmD, BCPS Clinical Pharmacist Clinical phone for 02/17/2022 is [phone] 02/17/2022 10:40 AM

## 2022-02-17 NOTE — Progress Notes (Signed)
Patient placed on full face BIPAP and tolerating well at this time.   

## 2022-02-18 DIAGNOSIS — J449 Chronic obstructive pulmonary disease, unspecified: Secondary | ICD-10-CM

## 2022-02-18 DIAGNOSIS — S066XAA Traumatic subarachnoid hemorrhage with loss of consciousness status unknown, initial encounter: Secondary | ICD-10-CM | POA: Diagnosis not present

## 2022-02-18 DIAGNOSIS — I609 Nontraumatic subarachnoid hemorrhage, unspecified: Secondary | ICD-10-CM | POA: Diagnosis not present

## 2022-02-18 DIAGNOSIS — J9611 Chronic respiratory failure with hypoxia: Secondary | ICD-10-CM | POA: Diagnosis not present

## 2022-02-18 DIAGNOSIS — Z7901 Long term (current) use of anticoagulants: Secondary | ICD-10-CM | POA: Diagnosis not present

## 2022-02-18 DIAGNOSIS — I10 Essential (primary) hypertension: Secondary | ICD-10-CM | POA: Diagnosis not present

## 2022-02-18 DIAGNOSIS — Z515 Encounter for palliative care: Secondary | ICD-10-CM

## 2022-02-18 DIAGNOSIS — R55 Syncope and collapse: Secondary | ICD-10-CM | POA: Diagnosis not present

## 2022-02-18 LAB — COMPREHENSIVE METABOLIC PANEL
ALT: 30 U/L (ref 0–44)
AST: 31 U/L (ref 15–41)
Albumin: 2.9 g/dL — ABNORMAL LOW (ref 3.5–5.0)
Alkaline Phosphatase: 60 U/L (ref 38–126)
Anion gap: 11 (ref 5–15)
BUN: 23 mg/dL (ref 8–23)
CO2: 30 mmol/L (ref 22–32)
Calcium: 9 mg/dL (ref 8.9–10.3)
Chloride: 99 mmol/L (ref 98–111)
Creatinine, Ser: 0.83 mg/dL (ref 0.61–1.24)
GFR, Estimated: >60 mL/min (ref >60)
Glucose, Bld: 136 mg/dL — ABNORMAL HIGH (ref 70–99)
Potassium: 3.7 mmol/L (ref 3.5–5.1)
Sodium: 140 mmol/L (ref 135–145)
Total Bilirubin: 0.5 mg/dL (ref 0.3–1.2)
Total Protein: 6.4 g/dL — ABNORMAL LOW (ref 6.5–8.1)

## 2022-02-18 LAB — GLUCOSE, CAPILLARY
Glucose-Capillary: 126 mg/dL — ABNORMAL HIGH (ref 70–99)
Glucose-Capillary: 142 mg/dL — ABNORMAL HIGH (ref 70–99)
Glucose-Capillary: 133 mg/dL — ABNORMAL HIGH (ref 70–99)
Glucose-Capillary: 156 mg/dL — ABNORMAL HIGH (ref 70–99)

## 2022-02-18 LAB — AMMONIA: Ammonia: 40 umol/L — ABNORMAL HIGH (ref 9–35)

## 2022-02-18 LAB — PROTIME-INR
INR: 1 (ref 0.8–1.2)
Prothrombin Time: 13.4 seconds (ref 11.4–15.2)

## 2022-02-18 MED ORDER — WARFARIN SODIUM 4 MG PO TABS
6.0000 mg | ORAL_TABLET | Freq: Once | ORAL | Status: AC
  Administered 2022-02-18: 6 mg via ORAL
  Filled 2022-02-18: qty 1

## 2022-02-18 NOTE — Progress Notes (Signed)
ANTICOAGULATION CONSULT NOTE Pharmacy Consult for warfarin Indication:   PE/Antiphospholipid Syndrome  Allergies  Allergen Reactions   Melatonin Anxiety   Altace [Ramipril] Cough    Patient Measurements: Height: 5\' 7"  (170.2 cm) Weight: 107 kg (235 lb 14.3 oz) IBW/kg (Calculated) : 66.1  Vital Signs: Temp: 98.2 F (36.8 C) (12/06 0429) Temp Source: Axillary (12/06 0429) BP: 125/62 (12/06 0429) Pulse Rate: 72 (12/06 0429)  Labs: Recent Labs    02/15/22 0805 02/16/22 0136 02/17/22 0231 02/18/22 0129  HGB 8.9* 8.6* 8.6*  --   HCT 30.1* 29.2* 28.8*  --   PLT 204 179 157  --   LABPROT  --   --   --  13.4  INR  --   --   --  1.0  CREATININE 0.84 0.70 0.84 0.83     Estimated Creatinine Clearance: 102.2 mL/min (by C-G formula based on SCr of 0.83 mg/dL).   Assessment: 66 yom on chronic warfarin for antiphospholipid syndrome with hx PE admitted with syncope and fall at home. Of note, he does have a hx of recurrent syncope with falls. CT noted small SAH and warfarin was held on admit. Latest CT head 12/4 was without progression of SAH and pharmacy consulted to resume warfarin.  INR today 1 PTA warfarin regimen: 5 mg daily  Goal of Therapy:  INR 2-3 (previous discussion about aiming on low end given recurrent falls)  Monitor platelets by anticoagulation protocol: Yes   Plan:  Repeat Warfarin 6 mg PO tonight Discontinue enoxaparin when INR >=2 INR daily Monitor for s/sx of bleeding  Thank you for involving pharmacy in this patient's care. 14/4, PharmD 02/18/2022 8:04 AM

## 2022-02-18 NOTE — Progress Notes (Signed)
Pt placed on BIPAP for night rest tolerating well. 

## 2022-02-18 NOTE — Progress Notes (Signed)
Palliative Care Progress Note, Assessment & Plan   Patient Name: Jose [name]       Date: 02/18/2022 DOB: [birth year]  Age: 66 y.o. MRN#: [id number] Attending Physician: Meredeth Ide, MD Primary Care Physician: [name], MD Admit Date: 02/12/2022  Reason for Consultation/Follow-up: Establishing goals of care  Subjective: Patient is sitting in the chair with nasal canula at 1L in place. He has no acute complaints. He is alert and oriented X3 and able to make his wishes known. No family present.   HPI: 66 y.o. male  with past medical history of CAD admitted on 02/12/2022 with CAD status post CABG, HLD, aortic valve stenosis, antiphospholipid syndrome (chronic AC-Coumadin), COPD (4-6 L baseline, trilogy-compliance complicated by mask fitting issues), PE (2011), type 2 diabetes, vertebrobasilar insufficiency, OSA, and recurrent syncope admitted on 11/30 with fall from home.    He was hospitalized for similar at [facility] earlier in November.    CT revealed small area of subarachnoid hemorrhage.    12/3, pt had increased confusion and repeat CT scan revealed no change from prior.    PMT was consulted to discuss GOC.  Summary of counseling/coordination of care: After reviewing the patient's chart and assessing the patient at bedside, I spoke with patient about his prognosis and goals of care.   Patient shares he is unhappy that he believes the medical team has been unable to diagnose his falling issue. He shares he is ready to go home and get a new set of doctors, minus his pulmonologist at [facility], so that he can hopefully get a diagnosis and treatment for his "wobbly legs". Therapeutic silence and active listening provided for patient to share his thoughts and emotions regarding current medical  situation.  Emotional support provided.  As far as symptom burden, patient endorses he feels fdine. He has no c/o pain, discomfort, difficulty breathing, N/V, body aches. He   I provided a listening ear as patient has several complaints in regards to his care.   We again reviewed advanced care planning and adherence to medical regimen. He shares he wants to discuss his goals and wishes with his wife when he gets home. He also says that he has survived two code blues so he is leaning towards remaining a full code. He defers further discussion of advanced care planning at this time.   Full code and full scope remain.   Low symptom burden.   Ongoing support for patient to be provided.   Physical Exam Vitals reviewed.  Constitutional:      General: He is not in acute distress.    Appearance: He is obese.  HENT:     Head: Normocephalic.     Mouth/Throat:     Mouth: Mucous membranes are moist.  Eyes:     Pupils: Pupils are equal, round, and reactive to light.  Cardiovascular:     Rate and Rhythm: Normal rate.     Pulses: Normal pulses.  Pulmonary:     Effort: Pulmonary effort is normal.  Abdominal:     Palpations: Abdomen is soft.  Musculoskeletal:        General: Normal range of motion.  Skin:    General: Skin is  warm and dry.  Neurological:     Mental Status: He is alert and oriented to person, place, and time.  Psychiatric:        Mood and Affect: Mood normal.        Behavior: Behavior normal.        Thought Content: Thought content normal.        Judgment: Judgment normal.             Palliative Assessment/Data: 60%    Total Time 25 minutes  Greater than 50%  of this time was spent counseling and coordinating care related to the above assessment and plan.  Thank you for allowing the Palliative Medicine Team to assist in the care of this patient.  Samara Deist L. Manon Hilding, FNP-BC Palliative Medicine Team Team Phone # [phone]

## 2022-02-18 NOTE — Progress Notes (Addendum)
Occupational Therapy Treatment Patient Details Name: Jose [name] MRN: [id number] DOB: 07/07/1955 Today's Date: 02/18/2022   History of present illness Pt is a 66 y/o male admitted 02/12/22 after fall. Head CT with small SAH at superior medial right convexity. Suspect cardiogenic syncope vs combination of deconditioning and gait instability. Head CT 12/2 with expanded hemorrhagic contusion in R frontal lobe. (+) flu 12/3. Pt with worsening mental status 12/3; repeat head CT 12/4 stable, mentation improved. Of note, pt with multiple hospitalizations 01/2022 with syncope. Other PMH includes CAD s/p CABG, AVS, PVD, COPD (wears 4L O2 baseline), PE, DM2, syncope.   OT comments  Pt with slow progression towards goals this session, needing min guard-max A for seated ADLs, supervision for transfers with RW. Pt with leaking primofit upon arrival, floor saturated in urine, pt aware but did not appear to have called for assistance, RN notified. Pt presenting with impairments listed below, will follow acutely. Continue to recommend HHOT at d/c.   Recommendations for follow up therapy are one component of a multi-disciplinary discharge planning process, led by the attending physician.  Recommendations may be updated based on patient status, additional functional criteria and insurance authorization.    Follow Up Recommendations  Home health OT     Assistance Recommended at Discharge Intermittent Supervision/Assistance  Patient can return home with the following  Assist for transportation;Assistance with cooking/housework;Help with stairs or ramp for entrance;A little help with walking and/or transfers;A little help with bathing/dressing/bathroom;Direct supervision/assist for medications management;Direct supervision/assist for financial management   Equipment Recommendations  Tub/shower seat    Recommendations for Other Services PT consult    Precautions / Restrictions Precautions Precautions:  Fall;Other (comment) Precaution Comments: h/o recurrent syncope; pt reports wearing 4L O2 [place] baseline Restrictions Weight Bearing Restrictions: No       Mobility Bed Mobility               General bed mobility comments: received sitting in recliner    Transfers Overall transfer level: Needs assistance Equipment used: Rolling walker (2 wheels) Transfers: Sit to/from Stand Sit to Stand: Supervision     Step pivot transfers: Min guard, Min assist           Balance Overall balance assessment: Needs assistance Sitting-balance support: Feet supported, No upper extremity supported Sitting balance-[name] Scale: Good     Standing balance support: During functional activity, Reliant on assistive device for balance, No upper extremity supported Standing balance-[name] Scale: Fair Standing balance comment: can static stand and take steps without UE support; stability improved with RW                           ADL either performed or assessed with clinical judgement   ADL Overall ADL's : Needs assistance/impaired             Lower Body Bathing: Maximal assistance;Sitting/lateral leans   Upper Body Dressing : Moderate assistance;Sitting Upper Body Dressing Details (indicate cue type and reason): donning gown on backside Lower Body Dressing: Maximal assistance   Toilet Transfer: Min guard;Rolling walker (2 wheels);Ambulation;Regular Teacher, adult education Details (indicate cue type and reason): simulated via functional mobility         Functional mobility during ADLs: Min guard;Rolling walker (2 wheels)      Extremity/Trunk Assessment Upper Extremity Assessment Upper Extremity Assessment: Generalized weakness   Lower Extremity Assessment Lower Extremity Assessment: Defer to PT evaluation        Vision  Perception Perception Perception: Not tested   Praxis Praxis Praxis: Not tested    Cognition Arousal/Alertness:  Awake/alert Behavior During Therapy: WFL for tasks assessed/performed Overall Cognitive Status: No family/caregiver present to determine baseline cognitive functioning Area of Impairment: Attention, Memory, Following commands, Safety/judgement, Awareness, Problem solving                   Current Attention Level: Selective Memory: Decreased short-term memory Following Commands: Follows one step commands consistently Safety/Judgement: Decreased awareness of deficits Awareness: Emergent Problem Solving: Requires verbal cues General Comments: pt with leaking primofit, floor of room saturated in urine pt aware but did not appear to have called for assist        Exercises      Shoulder Instructions       General Comments VSS on supplemental O2    Pertinent Vitals/ Pain       Pain Assessment Pain Assessment: No/denies pain  Home Living                                          Prior Functioning/Environment              Frequency  Min 2X/week        Progress Toward Goals  OT Goals(current goals can now be found in the care plan section)  Progress towards OT goals: Progressing toward goals  Acute Rehab OT Goals Patient Stated Goal: none state OT Goal Formulation: With patient Time For Goal Achievement: 02/28/22 Potential to Achieve Goals: Good ADL Goals Pt Will Perform Upper Body Dressing: with modified independence;sitting Pt Will Perform Lower Body Dressing: with min guard assist;sitting/lateral leans;sit to/from stand Pt Will Transfer to Toilet: with modified independence;ambulating;regular height toilet Pt Will Perform Tub/Shower Transfer: Tub transfer;Shower transfer;with supervision;ambulating;shower seat;rolling walker  Plan Discharge plan remains appropriate;Frequency remains appropriate    Co-evaluation                 AM-PAC OT "6 Clicks" Daily Activity     Outcome Measure   Help from another person eating meals?:  None Help from another person taking care of personal grooming?: A Little Help from another person toileting, which includes using toliet, bedpan, or urinal?: A Little Help from another person bathing (including washing, rinsing, drying)?: A Lot Help from another person to put on and taking off regular upper body clothing?: None Help from another person to put on and taking off regular lower body clothing?: A Lot 6 Click Score: 18    End of Session Equipment Utilized During Treatment: Gait belt;Rolling walker (2 wheels);Oxygen  OT Visit Diagnosis: Unsteadiness on feet (R26.81);Other abnormalities of gait and mobility (R26.89);Muscle weakness (generalized) (M62.81)   Activity Tolerance Patient tolerated treatment well   Patient Left in chair;with call bell/phone within reach;with chair alarm set   Nurse Communication Mobility status        Time: 8242-3536 OT Time Calculation (min): 32 min  Charges: OT General Charges $OT Visit: 1 Visit OT Treatments $Self Care/Home Management : 23-37 mins  Carver Fila, OTD, OTR/L SecureChat Preferred Acute Rehab [phone]   Carver Fila Koonce 02/18/2022, 3:31 PM

## 2022-02-18 NOTE — Progress Notes (Signed)
[facility]  PROGRESS NOTE  Jose [name] [id number] DOB: 02-May-1955 DOA: 02/12/2022 PCP: Elenore Paddy, MD   Brief HPI:    66 y.o. male with significant comorbidities including CAD s/p CABG, hyperlipidemia, aortic valve stenosis, PVD, antiphospholipid syndrome on chronic anticoagulation with Coumadin and, COPD, chronic hypoxic respiratory failure on 4 to 6 L of oxygen, history of PE in 2011, DM type II vertebrobasilar insufficiency, OSA on trilogy, and history of recurrent syncope needing multiple ED visits hospitalization again presents after a fall at home Recent multiple admissions related to syncope: [facility] hospital 11/9-11/13 fall w/ hematoma of the right glutes, hb dropped to 7.8 (baseline 9.7) s/p 1 unit prbc-seen by hemonc> resent antiphospholipid syndrome lab.   [facility] 11/20-11/24  s/p fall/syncopal episode at home acutely altered requiring intubation and admitted into the ICU, also treated for MRSA aspiration pneumonia.  Possible seizure though not captured on EEG.Dr. Leonel Ramsay note 11/24 very thoughtfully explains patient's prodrome before falling which is less consistent with seizure more likely syncope of other etiology, his posterior circulation stenosis not felt likely to cause to vertebral artery insufficiency. >Presented to [facility] ED 11/30 secondary to fall at home leaving a dent on refrigerator.   In ED: afebrile with pulse 74-107, respirations 13-25, blood pressure 102/45-147/71, and O2 saturating well on home oxygen 4 L  CT scan of the head, cervical spine, chest, abdomen, pelvis, and lumbar spine DONE: CT scan of the head noted small area of subarachnoid hemorrhage at the superior medial right convexity.  Labs were noted to be stable.  Neurosurgery have been consulted and recommended serial CT exams. The next CT scan of the brain noted slight interval increase in size of the small subarachnoid hemorrhage without mass effect or midline shift.  Cardiology was consulted for  syncope. Patient admitted for further management. Initially felt AS as the etiology for syncope so had cardiac cath 02/13/22 Multiple falls of unclear etiology cardiology does not recommend TAVR 12/3 AM worsening mental status febrile leukocytosis ABG with CO2 retention in the setting of noncompliance with BiPAP while placed on BiPAP also tested positive for influenza repeat CT head with stable bleed 12/4: Repeat CT head no progression of frontal bleeding.  Mentation improved.    Subjective   Patient seen and examined, denies any complaints.   Assessment/Plan:   Small traumatic SAH Right frontal contusion: -Presented with traumatic right frontal lobe hemorrhage, SAH -Seen by neurosurgery, CT head 2/3, 12/4 showed no progression of frontal bleeding -Coumadin held for 5 days -Dr. Maren Beach  discussed with Dr. Ronnald Ramp who said okay to resume Coumadin without Lovenox but advised prophylactic Lovenox while INR subtherapeutic -Patient to follow-up with neurosurgery as outpatient    Recurrent fall episodes of unclear etiology Cardiogenic syncope versus combination of deconditioning and gait instability Moderate aortic stenosis Asymptomatic B/L carotid artery stenosis: Seen by Dr. Einar Gip > Dr Maren Beach   had extensive discussion with Dr Einar Gip -he reviewed all his previous work-up including at [facility], several scans echocardiogram : his  aortic stenosis is moderate after cardiac cath  so TAVR will not be effective in preventing his recurrent fall, episodes of fall where he is completely conscious, he feels weak in his legs and gets down to the ground and sometimes he also states that he falls he does not remember how he fell.  VBI ruled out on recent admission-bilateral antegrade vertebral artery flow +.  CAD appears stable.  May have a component of cardiogenic syncope related to conduction system disease versus deconditioning  in the setting of his complex medical comorbidities/obesity/chronic hypoxia leg  edema. Due to flu: not doing loop recorder> they are planning Ziopatch on d/c and then loop recorder if needed. Will need surveillance carotid artery duplex.Of note he had a CABG following which for almost 2 years he was bedbound and nearly bedbound.  Patient to go home with home health PT   Acute metabolic encephalopathy:  -Resolved Suspect multifactorial with hypercarbia metabolic acidosis, influenza.Repeat CT x2 with stable bleed. Resolved w/ BiPAP> continue nightly and as needed, ammonia is elevated. Cont lactulose  bid, ammonia level checked this morning was 40 . Appreciate PCCM input.   Influenza A :Fever up to 101.3 02/15/22: respiratory panel is positive for influenza:  Started Tamiflu 12/3.   Procalcitonin less than 0.1 reassuring, blood culture negative to date . chest x-ray no acute finding.  Leukocytosis improving.   COPD with chronic respiratory failure-on 4 to 6 L [place] Morbid obesity with OHS-on home trilogy Unclear how much he is compliant with trilogy.Refused BiPAP 12/3 night> had worsening hypercarbia and mental status placed on BiPAP> now improved,  seen by pulmonary.  Continue supplemental oxygen daytime.Continue inhalers-breo-incruse/nebulizer and monitor respiratory status.   Ant 7th rib fracture on CT chest: Continue pain control   CAD, CABG x3: No chest pain.  Continue statin metoprolol.Per Dr. Ronnald Ramp from neurosurgery Dr Maren Beach  discussed this neurosurgery> okay to resume aspirin and Coumadin   Essential hypertension:BP stable/soft on metoprolol.  HLD: Cont statin  Asymptomatic bilateral carotid artery stenosis:carotid duplex reviewed from 12/06/2020, 50 to 69% stenosis right and left <15 to 49% stenosis with <50% stenosis of the left external carotid artery. B/L antegrade vertebra    Chronic diastolic CHF Chronic hyperpigmented hyperkeratotic lower extremities: Volume status is stable well, continue home torsemide> BNP 318. Net IO Since Admission: -5,340.33 mL  [02/17/22 1412]    Recent aspiration pneumonia: Was recently hospitalized at [facility] completed Zyvox 12/1.  Chest x-ray 12/3 is clear   Normocytic anemia: Hb b/l: 9 to 10 g.  Stable in mid 8s.Recently had PRBC transfusion in mid nov.  BPH: on Flomax    History of PE in 2011 History of antiphospholipid antibody syndrome on chronic Coumadin: Goal as low as 2.0 due to recurrent falls- was seen by Dr Tonita Phoenix on 01/23/22 at [facility]- there is a concern about his antiphospholipid Dx> repeat blood work was done and possible consideration to transition to [place] due to low risk Dx>  plan was to follow up outpatient with him w/. APS labs- came back at Anticardiolipin IgG 26 ( normal 0-14), Anticardiolopin IgM normal, hexagonal phase phospholipid up 46 ( 0-11 normal), dRVVT Mix 62 ( nl 0-40.4s), dDRVT Confirm up 2.0 ( nl 0.8-1.2), beta2 glycoprotein igG,I gM normal, lupus anticoagulant consistent with lupus anticoagulant.CTA PE 11/30: neg for PE.  Patient reported that he is not planning to switch to other blood thinner, and is also not planning to follow-up with hematology.  I have presented with him that he has very limited option - he will need anticoagulation at the same time at risk of traumatic bleeding given his recurrent fall> this was also discussed with patient's daughter.  Neurosurgery advised to hold anticoagulation Coumadin x5 days: resuming 02/17/22 with low dose lovenox    T2DM: Poorly controlled blood sugar in 200, last A1c normal 5.6 in 02/02/2022.  Diet controlled- on  SSI.Holding OHA.   Class II Obesity:Patient's Body mass index is 36.95 kg/m. : Will benefit with PCP follow-up, weight loss  healthy lifestyle and outpatient sleep evaluation.   Goals of care: Currently full code.  Patient has multiple complex severe comorbidities> overall prognosis does not appear bright.  He is in difficult condition due to his need for anticoagulation with Coumadin and at the same time having multiple  syncopal episodes leading to recurrent admissions and brain bleed, ocne intubation recently. Palliative care consulted to discuss CODE STATUS.  I have shared my concern with patient as well as his daughter.  Overall prognosis is not bright in this unfortunate gentleman.  DVT prophylaxis: enoxaparin (LOVENOX) injection 40 mg Start: 02/17/22      Medications     DULoxetine  90 mg Oral Daily   enoxaparin (LOVENOX) injection  40 mg Subcutaneous Q24H   fluticasone furoate-vilanterol  1 puff Inhalation Daily   And   umeclidinium bromide  1 puff Inhalation Daily   gabapentin  600 mg Oral BID   insulin aspart  0-6 Units Subcutaneous Q6H   lactulose  10 g Oral BID   metoprolol tartrate  12.5 mg Oral BID   oseltamivir  75 mg Oral BID   pantoprazole  40 mg Oral BID   rosuvastatin  10 mg Oral QPM   sodium chloride flush  3 mL Intravenous Q12H   sodium chloride flush  3 mL Intravenous Q12H   tamsulosin  0.4 mg Oral QHS   topiramate  75 mg Oral TID   torsemide  20 mg Oral Daily   Warfarin - Pharmacist Dosing Inpatient   Does not apply q1600     Data Reviewed:   CBG:  Recent Labs  Lab 02/17/22 1639 02/17/22 2140 02/18/22 0433 02/18/22 1144 02/18/22 1608  GLUCAP 140* 138* 126* 142* 133*    SpO2: 95 % O2 Flow Rate (L/min): 1 L/min FiO2 (%): 30 %    Vitals:   02/18/22 0815 02/18/22 0940 02/18/22 1100 02/18/22 1611  BP:  (!) 118/50 (!) 96/45 (!) 107/50  Pulse:  80 74 73  Resp:  20 18 20   Temp:  99.2 F (37.3 C) 98.4 F (36.9 C) 98 F (36.7 C)  TempSrc:  Oral Oral Oral  SpO2: 97% 94% 96% 95%  Weight:      Height:          Data Reviewed:  Basic Metabolic Panel: Recent Labs  Lab 02/14/22 0110 02/15/22 0805 02/16/22 0136 02/17/22 0231 02/18/22 0129  NA 140 133* 140 140 140  K 3.9 4.1 3.9 3.9 3.7  CL 107 100 103 103 99  CO2 26 24 29 28 30   GLUCOSE 130* 155* 136* 112* 136*  BUN 11 13 10 17 23   CREATININE 0.95 0.84 0.70 0.84 0.83  CALCIUM 8.2* 8.6* 8.7* 8.8*  9.0    CBC: Recent Labs  Lab 02/13/22 0148 02/13/22 1446 02/13/22 1455 02/14/22 0110 02/15/22 0805 02/16/22 0136 02/17/22 0231  WBC 9.7  --   --  9.0 14.1* 8.3 6.1  HGB 9.2*   < > 8.8* 7.8* 8.9* 8.6* 8.6*  HCT 32.1*   < > 26.0* 26.8* 30.1* 29.2* 28.8*  MCV 96.4  --   --  96.4 92.6 93.9 92.6  PLT 253  --   --  234 204 179 157   < > = values in this interval not displayed.    LFT Recent Labs  Lab 02/12/22 0035 02/15/22 1139 02/16/22 0136 02/17/22 0231 02/18/22 0129  AST 20 23 30  33 31  ALT 27 22 26 28 30   ALKPHOS 66 61 69 61  60  BILITOT 0.3 0.2* 0.5 0.5 0.5  PROT 7.1 7.1 7.1 6.7 6.4*  ALBUMIN 3.3* 3.1* 3.1* 2.9* 2.9*     Antibiotics: Anti-infectives (From admission, onward)    Start     Dose/Rate Route Frequency Ordered Stop   02/15/22 1430  oseltamivir (TAMIFLU) capsule 75 mg        75 mg Oral 2 times daily 02/15/22 1340 02/20/22 0959   02/12/22 2200  linezolid (ZYVOX) tablet 600 mg  Status:  Discontinued        600 mg Oral 2 times daily 02/12/22 1728 02/12/22 1739   02/12/22 2200  linezolid (ZYVOX) tablet 600 mg        600 mg Oral 2 times daily 02/12/22 1739 02/13/22 0912        DVT prophylaxis: Lovenox  Code Status: Full code  Family Communication: No family at bedside   CONSULTS    Objective    Physical Examination:   General-appears in no acute distress Heart-S1-S2, regular, no murmur auscultated Lungs-scattered wheezing bilaterally Abdomen-soft, nontender, no organomegaly Extremities-no edema in the lower extremities Neuro-alert, oriented x3, no focal deficit noted   Status is: Inpatient:             Oswald Hillock   [facility] If 7PM-7AM, please contact night-coverage at [email or web address], Office  [phone]   02/18/2022, 7:17 PM  LOS: 2 days

## 2022-02-18 NOTE — Progress Notes (Signed)
Mobility Specialist Progress Note:   02/18/22 0942  Mobility  Activity Ambulated with assistance in room  Level of Assistance Standby assist, set-up cues, supervision of patient - no hands on  Assistive Device None  Distance Ambulated (ft) 40 ft  Activity Response Tolerated well  $Mobility charge 1 Mobility   Pt EOB needign to get to sink to get washed up. No complaints of pain. Left in chair with call bell in reach and all needs met.   Gareth Eagle Keyonda Bickle Mobility Specialist Please contact via [employer] or  Rehab Office at [phone]

## 2022-02-18 NOTE — Progress Notes (Signed)
Physical Therapy Treatment Patient Details Name: Jose [name] MRN: [id number] DOB: December 13, 1955 Today's Date: 02/18/2022   History of Present Illness Pt is a 66 y/o male admitted 02/12/22 after fall. Head CT with small SAH at superior medial right convexity. Suspect cardiogenic syncope vs combination of deconditioning and gait instability. Head CT 12/2 with expanded hemorrhagic contusion in R frontal lobe. (+) flu 12/3. Pt with worsening mental status 12/3; repeat head CT 12/4 stable, mentation improved. Of note, pt with multiple hospitalizations 01/2022 with syncope. Other PMH includes CAD s/p CABG, AVS, PVD, COPD (wears 4L O2 baseline), PE, DM2, syncope.   PT Comments    Pt progressing with mobility. Today's session focused on ambulation for improving strength and activity tolerance, as well as assessing supplemental O2 needs with activity (see values below), though pt adamant he needs 4L O2 [place] at home despite education attempts. Pt moving well, but self-limits activity progression with c/o various frustrations and fatigue. Pt remains limited by generalized weakness, decreased activity tolerance, poor balance strategies/postural reactions and impaired cognition. Will continue to follow acutely to address established goals.  SATURATION QUALIFICATIONS: Patient Saturations on Room Air at Rest = 92% Patient Saturations on Room Air while Ambulating = 87% Patient Saturations on 2 Liters of oxygen while Ambulating = 93%    Recommendations for follow up therapy are one component of a multi-disciplinary discharge planning process, led by the attending physician.  Recommendations may be updated based on patient status, additional functional criteria and insurance authorization.  Follow Up Recommendations  Home health PT     Assistance Recommended at Discharge Frequent or constant Supervision/Assistance  Patient can return home with the following A little help with walking and/or transfers;A little help  with bathing/dressing/bathroom;Assistance with cooking/housework;Direct supervision/assist for medications management;Direct supervision/assist for financial management;Assist for transportation;Help with stairs or ramp for entrance   Equipment Recommendations  Other (comment) (shower seat)    Recommendations for Other Services  Mobility Specialist     Precautions / Restrictions Precautions Precautions: Fall;Other (comment) Precaution Comments: h/o recurrent syncope; pt reports wearing 4L O2 [place] baseline Restrictions Weight Bearing Restrictions: No     Mobility  Bed Mobility               General bed mobility comments: received sitting in recliner    Transfers Overall transfer level: Needs assistance Equipment used: None, Rolling walker (2 wheels) Transfers: Sit to/from Stand Sit to Stand: Supervision           General transfer comment: multiple sit<>stands from recliner 1x without DME, subsequent trials with RW, supervision for safety    Ambulation/Gait Ambulation/Gait assistance: Min guard Gait Distance (Feet): 40 Feet (+ 140') Assistive device: 1 person hand held assist, Rolling walker (2 wheels) Gait Pattern/deviations: Step-through pattern, Decreased stride length Gait velocity: Decreased     General Gait Details: initial ambulation in room without supplemental O2 (pt adamant against hallway walk without O2 donned), pt preference for HHA to stabilize, slow guarded gait, pt reports SpO2 staying up "because we are talking", SpO2 briefly down to 87% on RA, quick recovery to >88% with seated rest. performed hallway ambulation with RW and min guard, 2L O2 [place] donned, cues for self-monitoring activity pacing; no reports of dizziness. pt declines additional ambulation distance secondary to fatigue   Stairs             Wheelchair Mobility    Modified Rankin (Stroke Patients Only)       Balance Overall balance assessment: Needs assistance Sitting-balance  support: Feet supported, No upper extremity supported Sitting balance-[name] Scale: Good     Standing balance support: During functional activity, Reliant on assistive device for balance, No upper extremity supported Standing balance-[name] Scale: Fair Standing balance comment: can static stand and take steps without UE support; stability improved with RW                            Cognition Arousal/Alertness: Awake/alert Behavior During Therapy: WFL for tasks assessed/performed Overall Cognitive Status: No family/caregiver present to determine baseline cognitive functioning Area of Impairment: Attention, Memory, Following commands, Safety/judgement, Awareness, Problem solving                   Current Attention Level: Selective Memory: Decreased short-term memory Following Commands: Follows one step commands consistently Safety/Judgement: Decreased awareness of deficits Awareness: Emergent Problem Solving: Requires verbal cues General Comments: pt focused on dissatisfaction and frustration with hospital admission and lack of answers for syncopal episodes. attempted to educ on potential lower supplemental O2 needs, but pt adamant his doctors outside the hospital want him on 4L. pt states "all I do is sit around" but when told he has been on PT's caseload, pt states, "Oh yeah, I just tell them no because I don't want to do anything." difficult to reason with pt; pt states, "I'm not trying to be argumentative." follows simple commands appropriately        Exercises      General Comments General comments (skin integrity, edema, etc.): SpO2 down to 87% on RA with ambulation; SpO2 >/93% on 2L O2 [place] with ambulation. increased time educ on importance of more frequent activity, pt states he is unhappy he has not moved more but also states he declines mobility specialist/therapists "because I'm so tired"      Pertinent Vitals/Pain Pain Assessment Pain Assessment: No/denies  pain Pain Intervention(s): Monitored during session    Home Living                          Prior Function            PT Goals (current goals can now be found in the care plan section) Acute Rehab PT Goals Patient Stated Goal: "I want someone to tell my why the syncope happens" Progress towards PT goals: Progressing toward goals    Frequency    Min 3X/week      PT Plan Current plan remains appropriate    Co-evaluation              AM-PAC PT "6 Clicks" Mobility   Outcome Measure  Help needed turning from your back to your side while in a flat bed without using bedrails?: A Little Help needed moving from lying on your back to sitting on the side of a flat bed without using bedrails?: A Little Help needed moving to and from a bed to a chair (including a wheelchair)?: A Little Help needed standing up from a chair using your arms (e.g., wheelchair or bedside chair)?: A Little Help needed to walk in hospital room?: A Little Help needed climbing 3-5 steps with a railing? : A Lot 6 Click Score: 17    End of Session Equipment Utilized During Treatment: Oxygen;Gait belt Activity Tolerance: Patient tolerated treatment well Patient left: in chair;with call bell/phone within reach Nurse Communication: Mobility status PT Visit Diagnosis: Unsteadiness on feet (R26.81);Other abnormalities of gait and mobility (R26.89);Difficulty in walking,  not elsewhere classified (R26.2);Repeated falls (R29.6);Muscle weakness (generalized) (M62.81)     Time: 8206-0156 PT Time Calculation (min) (ACUTE ONLY): 23 min  Charges:  $Therapeutic Exercise: 8-22 mins $Therapeutic Activity: 8-22 mins                      Ina Homes, PT, DPT Acute Rehabilitation Services  Personal: Secure Chat Rehab Office: [phone]  Malachy Chamber 02/18/2022, 1:47 PM

## 2022-02-19 ENCOUNTER — Other Ambulatory Visit: Payer: Self-pay | Admitting: Physician Assistant

## 2022-02-19 ENCOUNTER — Other Ambulatory Visit (INDEPENDENT_AMBULATORY_CARE_PROVIDER_SITE_OTHER): Payer: Medicare Other

## 2022-02-19 ENCOUNTER — Other Ambulatory Visit (HOSPITAL_COMMUNITY): Payer: Self-pay

## 2022-02-19 DIAGNOSIS — J449 Chronic obstructive pulmonary disease, unspecified: Secondary | ICD-10-CM | POA: Diagnosis not present

## 2022-02-19 DIAGNOSIS — R55 Syncope and collapse: Secondary | ICD-10-CM

## 2022-02-19 DIAGNOSIS — J9611 Chronic respiratory failure with hypoxia: Secondary | ICD-10-CM | POA: Diagnosis not present

## 2022-02-19 DIAGNOSIS — I10 Essential (primary) hypertension: Secondary | ICD-10-CM | POA: Diagnosis not present

## 2022-02-19 LAB — CBC
HCT: 26.9 % — ABNORMAL LOW (ref 39.0–52.0)
Hemoglobin: 8.5 g/dL — ABNORMAL LOW (ref 13.0–17.0)
MCH: 28.1 pg (ref 26.0–34.0)
MCHC: 31.6 g/dL (ref 30.0–36.0)
MCV: 88.8 fL (ref 80.0–100.0)
Platelets: 211 10*3/uL (ref 150–400)
RBC: 3.03 MIL/uL — ABNORMAL LOW (ref 4.22–5.81)
RDW: 16.2 % — ABNORMAL HIGH (ref 11.5–15.5)
WBC: 6 10*3/uL (ref 4.0–10.5)
nRBC: 0 % (ref 0.0–0.2)

## 2022-02-19 LAB — COMPREHENSIVE METABOLIC PANEL
ALT: 31 U/L (ref 0–44)
AST: 28 U/L (ref 15–41)
Albumin: 3.2 g/dL — ABNORMAL LOW (ref 3.5–5.0)
Alkaline Phosphatase: 66 U/L (ref 38–126)
Anion gap: 10 (ref 5–15)
BUN: 23 mg/dL (ref 8–23)
CO2: 31 mmol/L (ref 22–32)
Calcium: 9.2 mg/dL (ref 8.9–10.3)
Chloride: 98 mmol/L (ref 98–111)
Creatinine, Ser: 1.02 mg/dL (ref 0.61–1.24)
GFR, Estimated: >60 mL/min (ref >60)
Glucose, Bld: 154 mg/dL — ABNORMAL HIGH (ref 70–99)
Potassium: 3.6 mmol/L (ref 3.5–5.1)
Sodium: 139 mmol/L (ref 135–145)
Total Bilirubin: 0.3 mg/dL (ref 0.3–1.2)
Total Protein: 7.1 g/dL (ref 6.5–8.1)

## 2022-02-19 LAB — GLUCOSE, CAPILLARY
Glucose-Capillary: 150 mg/dL — ABNORMAL HIGH (ref 70–99)
Glucose-Capillary: 110 mg/dL — ABNORMAL HIGH (ref 70–99)

## 2022-02-19 LAB — PROTIME-INR
INR: 1 (ref 0.8–1.2)
Prothrombin Time: 13.2 seconds (ref 11.4–15.2)

## 2022-02-19 MED ORDER — WARFARIN SODIUM 7.5 MG PO TABS: 7.5000 mg | ORAL_TABLET | Freq: Once | ORAL | Status: DC

## 2022-02-19 MED ORDER — METOPROLOL TARTRATE 25 MG PO TABS
12.5000 mg | ORAL_TABLET | Freq: Two times a day (BID) | ORAL | 0 refills | Status: AC
  Filled 2022-02-19: qty 30, 30d supply, fill #0

## 2022-02-19 MED ORDER — OSELTAMIVIR PHOSPHATE 75 MG PO CAPS
75.0000 mg | ORAL_CAPSULE | Freq: Once | ORAL | 0 refills | Status: AC
  Filled 2022-02-19: qty 1, 1d supply, fill #0

## 2022-02-19 MED ORDER — GLIMEPIRIDE 4 MG PO TABS: 4.0000 mg | ORAL_TABLET | Freq: Every day | ORAL | Status: AC

## 2022-02-19 NOTE — Care Management Important Message (Signed)
Important Message  Patient Details  Name: Jose [name] MRN: [id number] Date of Birth: 08-03-1955   Medicare Important Message Given:  Yes     Renie Ora 02/19/2022, 12:54 PM

## 2022-02-19 NOTE — Progress Notes (Signed)
Patient given discharge instructions. PIV removed. Telemetry box removed, CCMD notified. TOC pharmacy meds given to patient. Patient taken in wheelchair by staff to vehicle.   Kenard Gower, RN

## 2022-02-19 NOTE — TOC Transition Note (Signed)
Transition of Care (TOC) - CM/SW Discharge Note Donn Pierini RN, BSN Transitions of Care Unit 4E- RN Case Manager See Treatment Team for direct phone #   Patient Details  Name: Jose [name] MRN: [id number] Date of Birth: 1955-10-19  Transition [facility]) CM/SW Contact:  Darrold Span, RN Phone Number: 02/19/2022, 11:52 AM   Clinical Narrative:    Pt stable for transition home today, Pt active with [facility] - orders have been placed for resumption of HH services- PT/OT and will add bath aide.   CM has contacted [place] w/ [facility] for resumption of HH needs. [facility] will contact patient for scheduling.   No further TOC needs noted at this time.    Final next level of care: Home w Home Health Services Barriers to Discharge: No Barriers Identified   Patient Goals and CMS Choice Patient states their goals for this hospitalization and ongoing recovery are:: return home CMS Medicare.gov Compare Post Acute Care list provided to:: Patient Choice offered to / list presented to : Patient  Discharge Placement                 Home w/ [facility]      Discharge Plan and Services   Discharge Planning Services: CM Consult Post Acute Care Choice: Home Health, Resumption of Svcs/PTA Provider          DME Arranged: N/A DME Agency: [facility]       HH Arranged: PT, OT, Nurse's Aide HH Agency: [facility] Date HH Agency Contacted: 02/19/22 Time HH Agency Contacted: 1151 Representative spoke with at [facility] Agency: Morrie Sheldon  Social Determinants of Health (SDOH) Interventions     Readmission Risk Interventions    02/19/2022   11:51 AM 01/23/2022   11:20 AM 07/16/2020   12:39 PM  Readmission Risk Prevention Plan  Transportation Screening Complete Complete Complete  PCP or Specialist Appt within 3-5 Days  Complete Complete  HRI or Home Care Consult  Complete Complete  Social Work Consult for Recovery Care Planning/Counseling  Complete Complete   Palliative Care Screening  Not Applicable Not Applicable  Medication Review [employer]) Complete Complete Complete  HRI or Home Care Consult Complete    SW Recovery Care/Counseling Consult Complete    Palliative Care Screening Complete    Skilled Nursing Facility Not Applicable

## 2022-02-19 NOTE — Progress Notes (Unsigned)
Enrolled for [employer] to mail a ZIO AT Live Telemetry monitor to patients address on file.   Dr. Lambert to read. 

## 2022-02-19 NOTE — Progress Notes (Signed)
Mobility Specialist Progress Note:   02/19/22 1120  Mobility  Activity Ambulated with assistance in hallway  Level of Assistance Standby assist, set-up cues, supervision of patient - no hands on  Assistive Device Front wheel walker  Distance Ambulated (ft) 60 ft  Activity Response Tolerated well  $Mobility charge 1 Mobility   Pt received in chair willing to participate in mobility. No complaints of pain. Left in bed with call bell in reach and all needs met.   Gareth Eagle Bryden Darden Mobility Specialist Please contact via [employer] or  Rehab Office at [phone]

## 2022-02-19 NOTE — Progress Notes (Signed)
Physical Therapy Treatment Patient Details Name: Jose [name] MRN: [id number] DOB: 23-Jul-1955 Today's Date: 02/19/2022   History of Present Illness Pt is a 66 y/o male admitted 02/12/22 after fall. Head CT with small SAH at superior medial right convexity. Suspect cardiogenic syncope vs combination of deconditioning and gait instability. Head CT 12/2 with expanded hemorrhagic contusion in R frontal lobe. (+) flu 12/3. Pt with worsening mental status 12/3; repeat head CT 12/4 stable, mentation improved. Of note, pt with multiple hospitalizations 01/2022 with syncope. Other PMH includes CAD s/p CABG, AVS, PVD, COPD (wears 4L O2 baseline), PE, DM2, syncope.    PT Comments    Pt is making good progress with mobility, ambulating up to ~80 ft and navigating x2 stairs at a min guard assist level without LOB before fatiguing and needing to sit. Pt does demonstrate improved stability when using a rollator to ambulate and when navigating stairs with bil UE support on a rail with pt turned sideways. Will continue to follow acutely. Current recommendations remain appropriate.    Recommendations for follow up therapy are one component of a multi-disciplinary discharge planning process, led by the attending physician.  Recommendations may be updated based on patient status, additional functional criteria and insurance authorization.  Follow Up Recommendations  Home health PT     Assistance Recommended at Discharge Frequent or constant Supervision/Assistance  Patient can return home with the following A little help with walking and/or transfers;A little help with bathing/dressing/bathroom;Assistance with cooking/housework;Direct supervision/assist for medications management;Direct supervision/assist for financial management;Assist for transportation;Help with stairs or ramp for entrance   Equipment Recommendations  Other (comment) (shower seat)    Recommendations for Other Services       Precautions /  Restrictions Precautions Precautions: Fall;Other (comment) Precaution Comments: h/o recurrent syncope; pt reports wearing 4L O2 [place] baseline Restrictions Weight Bearing Restrictions: No     Mobility  Bed Mobility               General bed mobility comments: Pt up in chair upon arrival    Transfers Overall transfer level: Needs assistance Equipment used: Rollator (4 wheels) Transfers: Sit to/from Stand Sit to Stand: Min guard           General transfer comment: Min guard assist to hold rollator still to ensure safety with transfer x1 from recliner and x1 from rollator. Cues provided on how to lock brakes, which pt reported not being aware of before.    Ambulation/Gait Ambulation/Gait assistance: Min guard Gait Distance (Feet): 80 Feet Assistive device: Rollator (4 wheels) Gait Pattern/deviations: Step-through pattern, Decreased stride length, Trunk flexed Gait velocity: Decreased Gait velocity interpretation: <1.31 ft/sec, indicative of household ambulator   General Gait Details: Pt with slow, but mostly steady gait using rollator. Cues needed to remain proximal to rollator. Min guard for safety   Stairs Stairs: Yes Stairs assistance: Min guard Stair Management: One rail Right, Step to pattern, Sideways Number of Stairs: 2 General stair comments: Bil hands on R rail if ascending and pt sideways facing the rail to ascend and descend x2 stairs. Extra effort noted but no LOB, min guard for safety. Pt needing seated rest break after gait + stairs.   Wheelchair Mobility    Modified Rankin (Stroke Patients Only)       Balance Overall balance assessment: Needs assistance Sitting-balance support: Feet supported, No upper extremity supported Sitting balance-[name] Scale: Good     Standing balance support: No upper extremity supported, During functional activity, Single extremity supported, Bilateral  upper extremity supported Standing balance-[name] Scale:  Fair Standing balance comment: can static stand and take steps without UE support; stability improved with RW                            Cognition Arousal/Alertness: Awake/alert Behavior During Therapy: WFL for tasks assessed/performed Overall Cognitive Status: No family/caregiver present to determine baseline cognitive functioning Area of Impairment: Attention, Memory, Following commands, Safety/judgement, Awareness, Problem solving                   Current Attention Level: Selective Memory: Decreased short-term memory Following Commands: Follows one step commands consistently Safety/Judgement: Decreased awareness of deficits Awareness: Emergent Problem Solving: Requires verbal cues General Comments: Pt needing redirecting at times. Needs repeated cues of plan intermittently        Exercises      General Comments General comments (skin integrity, edema, etc.): SpO2 >/= 93% on 2L O2; educated pt's wife over phone at start of session that she would guard pt from behind when ascending stairs and from in front if safe when descending stairs and pt can navigate stairs sideways if able; encouraged pt's wife via phone to assist with meds and finances for safety due to noted cog deficits, she verbalized understanding      Pertinent Vitals/Pain Pain Assessment Pain Assessment: Faces Faces Pain Scale: No hurt Pain Intervention(s): Monitored during session    Home Living                          Prior Function            PT Goals (current goals can now be found in the care plan section) Acute Rehab PT Goals Patient Stated Goal: to go home PT Goal Formulation: With patient/family Time For Goal Achievement: 02/27/22 Potential to Achieve Goals: Good Progress towards PT goals: Progressing toward goals    Frequency    Min 3X/week      PT Plan Current plan remains appropriate    Co-evaluation              AM-PAC PT "6 Clicks" Mobility    Outcome Measure  Help needed turning from your back to your side while in a flat bed without using bedrails?: A Little Help needed moving from lying on your back to sitting on the side of a flat bed without using bedrails?: A Little Help needed moving to and from a bed to a chair (including a wheelchair)?: A Little Help needed standing up from a chair using your arms (e.g., wheelchair or bedside chair)?: A Little Help needed to walk in hospital room?: A Little Help needed climbing 3-5 steps with a railing? : A Little 6 Click Score: 18    End of Session Equipment Utilized During Treatment: Oxygen Activity Tolerance: Patient tolerated treatment well Patient left: in chair;with call bell/phone within reach;with chair alarm set   PT Visit Diagnosis: Unsteadiness on feet (R26.81);Other abnormalities of gait and mobility (R26.89);Difficulty in walking, not elsewhere classified (R26.2);Repeated falls (R29.6);Muscle weakness (generalized) (M62.81)     Time: 3329-5188 PT Time Calculation (min) (ACUTE ONLY): 24 min  Charges:  $Gait Training: 23-37 mins                     Raymond Gurney, PT, DPT Acute Rehabilitation Services  Office: [phone]    Jewel Baize 02/19/2022, 1:39 PM

## 2022-02-19 NOTE — Discharge Summary (Addendum)
Physician Discharge Summary   Patient: Jose [name] MRN: [id number] DOB: 10-Dec-1955  Admit date:     02/12/2022  Discharge date: 02/19/22  Discharge Physician: Oswald Hillock   PCP: Elenore Paddy, MD   Recommendations at discharge:   Follow-up PCP in 1 week Follow-up with neurosurgery as outpatient Follow-up cardiology as outpatient  Discharge Diagnoses: Principal Problem:   Traumatic subarachnoid hemorrhage ([place]) Active Problems:   Moderate aortic stenosis   Cardiac syncope   COPD, severe (HCC)   Chronic respiratory failure with hypoxia and hypercapnia (HCC)   Essential hypertension   Antiphospholipid syndrome (HCC)   Sleep apnea   History of pulmonary embolus (PE)-2011   Type 2 diabetes mellitus with hyperlipidemia (HCC)   Acute on chronic respiratory failure with hypoxia and hypercapnia (HCC)   Obesity (BMI 30-39.9)   CAD (coronary artery disease)   Muscular deconditioning   Asymptomatic bilateral carotid artery stenosis   Encephalopathy acute   Acute respiratory failure with hypoxia and hypercapnia (HCC)   Syncope  Resolved Problems:   * No resolved hospital problems. *  Hospital Course: 66 y.o. 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   [facility] 11/20-11/24  s/p fall/syncopal episode at home acutely altered requiring intubation and admitted into the ICU, also treated for  MRSA aspiration pneumonia.  Possible seizure though not captured on EEG.Dr. Leonel Ramsay note 11/24 very thoughtfully explains patient's prodrome before falling which is less consistent with seizure more likely syncope of other etiology, his posterior circulation stenosis not felt likely to cause to vertebral artery insufficiency. >Presented to [facility] ED 11/30 secondary to fall at home leaving a dent on refrigerator.   In ED: afebrile with pulse 74-107, respirations 13-25, blood pressure 102/45-147/71, and O2 saturating well on home oxygen 4 L  CT scan of the head, cervical spine, chest, abdomen, pelvis, and lumbar spine DONE: CT scan of the head noted small area of subarachnoid hemorrhage at the superior medial right convexity.  Labs were noted to be stable.  Neurosurgery have been consulted and recommended serial CT exams. The next CT scan of the brain noted slight interval increase in size of the small subarachnoid hemorrhage without mass effect or midline shift.  Cardiology was consulted for syncope. Patient admitted for further management. Initially felt AS as the etiology for syncope so had cardiac cath 02/13/22 Multiple falls of unclear etiology cardiology does not recommend TAVR 12/3 AM worsening mental status febrile leukocytosis ABG with CO2 retention in the setting of noncompliance with BiPAP while placed on BiPAP also tested positive for influenza repeat CT head with stable bleed 12/4: Repeat CT head no progression of frontal bleeding.  Mentation improved.  Assessment and Plan:  Small traumatic SAH Right frontal contusion: -Presented with traumatic right frontal lobe hemorrhage, SAH -Seen by neurosurgery, CT head 2/3, 12/4 showed no progression of frontal bleeding -Coumadin held for 5 days -Dr. Maren Beach  discussed with Dr. Ronnald Ramp who said okay to resume Coumadin without Lovenox but advised prophylactic Lovenox  while INR subtherapeutic -Patient to follow-up with neurosurgery as outpatient      Recurrent fall episodes of unclear etiology Cardiogenic syncope versus combination of deconditioning and gait instability Moderate aortic stenosis Asymptomatic B/L carotid artery stenosis: Seen by Dr. Einar Gip > Dr Maren Beach   had extensive discussion with Dr Einar Gip -he reviewed all his previous work-up including at [facility], several scans echocardiogram : his  aortic stenosis is moderate after cardiac cath  so TAVR will not be effective in preventing his recurrent fall, episodes of fall where he is completely conscious, he feels weak in his legs and gets down to the ground and sometimes he also states that he falls he does not remember how he fell.  VBI ruled out on recent admission-bilateral antegrade vertebral artery flow +.  CAD appears stable.  May have a component of cardiogenic syncope related to conduction system disease versus deconditioning in the setting of his complex medical comorbidities/obesity/chronic hypoxia leg edema. Due to flu: not doing loop recorder> they are planning Ziopatch on d/c and then loop recorder if needed. Will need surveillance carotid artery duplex.Of note he had a CABG following which for almost 2 years he was bedbound and nearly bedbound.  Patient to go home with home health PT   Acute metabolic encephalopathy:  -Resolved Suspect multifactorial with hypercarbia metabolic acidosis, influenza.Repeat CT x2 with stable bleed. Resolved w/ BiPAP> continue nightly and as needed, ammonia is elevated. Cont lactulose  bid, ammonia level checked this morning was 40 . Appreciate PCCM input.   Influenza A :Fever up to 101.3 02/15/22: respiratory panel is positive for influenza:  Started Tamiflu 12/3.  Today is last day for Tamiflu Procalcitonin less than 0.1 reassuring, blood culture negative to date . chest x-ray no acute finding.  Leukocytosis improving.   COPD with chronic respiratory failure-on 4 to 6 L [place] Morbid obesity with OHS-on home trilogy Unclear how much he is compliant  with trilogy.Refused BiPAP 12/3 night> had worsening hypercarbia and mental status placed on BiPAP> now improved,  seen by pulmonary.  Continue supplemental oxygen daytime.Continue inhalers-breo-incruse/nebulizer and monitor respiratory status.   Ant 7th rib fracture on CT chest: Continue pain control   CAD, CABG x3: No chest pain.  Continue statin metoprolol.Per Dr. Ronnald Ramp from neurosurgery Dr Maren Beach  discussed this neurosurgery> okay to resume aspirin and Coumadin    Essential hypertension:BP stable/soft on metoprolol.   HLD: Cont statin   Asymptomatic bilateral carotid artery stenosis:carotid duplex reviewed from 12/06/2020, 50 to 69% stenosis right and left <15 to 49% stenosis with <50% stenosis of the left external carotid artery. B/L antegrade vertebra    Chronic diastolic CHF Chronic hyperpigmented hyperkeratotic lower extremities: Volume status is stable well, continue home torsemide> BNP 318. Net IO Since Admission: -5,340.33 mL [02/17/22 1412]    Recent aspiration pneumonia: Was recently hospitalized at [facility] completed Zyvox 12/1.  Chest x-ray 12/3 is clear   Normocytic anemia: Hb b/l: 9 to 10 g.  Stable in mid 8s.Recently had PRBC transfusion in mid nov.   BPH: on Flomax    History of PE in 2011 History of antiphospholipid antibody syndrome on chronic Coumadin: Goal as low as 2.0 due to recurrent falls- was seen by Dr Tonita Phoenix on 01/23/[facility] at [facility]- there is a concern about his antiphospholipid Dx> repeat blood work was done and possible consideration to transition to [place] due to low risk Dx>  plan was to follow up outpatient with him w/. APS labs- came back at Anticardiolipin IgG 26 (  normal 0-14), Anticardiolopin IgM normal, hexagonal phase phospholipid up 46 ( 0-11 normal), dRVVT Mix 62 ( nl 0-40.4s), dDRVT Confirm up 2.0 ( nl 0.8-1.2), beta2 glycoprotein igG,I gM normal, lupus anticoagulant consistent with lupus anticoagulant.CTA PE 11/30: neg for PE.  Patient reported that  he is not planning to switch to other blood thinner, and is also not planning to follow-up with hematology.  I have presented with him that he has very limited option - he will need anticoagulation at the same time at risk of traumatic bleeding given his recurrent fall> this was also discussed with patient's daughter.  Neurosurgery advised to hold anticoagulation Coumadin x5 days: Resumed on 02/17/2022 along with low-dose Lovenox 40 mg daily for DVT prophylaxis.   Patient will be discharged on Coumadin 5 mg daily which he was taking at home.  I advised patient to follow-up with PCP in 1 week to check INR as outpatient.    T2 Diabetes Mellitus: Last Hba1c was 5.6. He takes Amaryl 4 mg p.o. twice daily at home.  I noticed that every time he came to hospital his blood sugar was less than 100.  I discussed with patient my concerns that he may be getting hypoglycemic episodes which may be contributing to patient's falling.  At this time I am going to cut down Amaryl to 4 mg p.o. daily in the morning with breakfast.  Patient agrees to change the dose of Amaryl and he will check his blood sugar at home and follow-up with PCP as outpatient.   Class II Obesity:Patient's Body mass index is 36.95 kg/m. : Will benefit with PCP follow-up, weight loss  healthy lifestyle and outpatient sleep evaluation.   Goals of care: Currently full code.  Patient has multiple complex severe comorbidities> overall prognosis does not appear bright.  He is in difficult condition due to his need for anticoagulation with Coumadin and at the same time having multiple syncopal episodes leading to recurrent admissions and brain bleed, ocne intubation recently. Palliative care consulted to discuss CODE STATUS. Marland Kitchen  Overall prognosis is not bright in this unfortunate gentleman.      Consultants: Neurosurgery, cardiology,  palliative care Procedures performed:  Disposition: Home Diet recommendation:  Discharge Diet Orders (From admission,  onward)     Start     Ordered   02/19/22 0000  Diet - low sodium heart healthy        02/19/22 1350           Carb modified diet DISCHARGE MEDICATION: Allergies as of 02/19/2022       Reactions   Melatonin Anxiety   Altace [ramipril] Cough        Medication List     STOP taking these medications    aspirin EC 81 MG tablet   enoxaparin 120 MG/0.8ML injection Commonly known as: LOVENOX   linezolid 600 MG tablet Commonly known as: ZYVOX       TAKE these medications    acetaminophen 500 MG tablet Commonly known as: TYLENOL Take 1,000 mg by mouth every 6 (six) hours as needed for mild pain.   albuterol 108 (90 Base) MCG/ACT inhaler Commonly known as: VENTOLIN HFA Inhale 2 puffs into the lungs every 6 (six) hours as needed for wheezing or shortness of breath.   albuterol (5 MG/ML) 0.5% nebulizer solution Commonly known as: PROVENTIL Take 0.5 mLs (2.5 mg total) by nebulization every 6 (six) hours as needed for wheezing or shortness of breath.   DULoxetine 60 MG capsule Commonly known  as: CYMBALTA Take 60 mg by mouth daily. Take one capsule (60 mg) along with 30 mg capsule to equal 90 mg daily   DULoxetine 30 MG capsule Commonly known as: CYMBALTA Take 30 mg by mouth daily.   gabapentin 600 MG tablet Commonly known as: NEURONTIN Take 600 mg by mouth 2 (two) times daily.   glimepiride 4 MG tablet Commonly known as: AMARYL Take 1 tablet (4 mg total) by mouth daily with breakfast. What changed: when to take this   ipratropium-albuterol 0.5-2.5 (3) MG/3ML Soln Commonly known as: DUONEB Take 3 mLs by nebulization every 4 (four) hours as needed (For shortness of breath or wheezing).   metoprolol tartrate 25 MG tablet Commonly known as: LOPRESSOR Take 0.5 tablets (12.5 mg total) by mouth 2 (two) times daily.   nitroGLYCERIN 0.4 MG SL tablet Commonly known as: NITROSTAT Place 0.4 mg under the tongue every 5 (five) minutes as needed for chest pain.    oseltamivir 75 MG capsule Commonly known as: TAMIFLU Take 1 capsule (75 mg total) by mouth once for 1 dose.   OXYGEN Inhale 4-6 L into the lungs daily.   pantoprazole 40 MG tablet Commonly known as: PROTONIX Take 40 mg by mouth 2 (two) times daily.   rosuvastatin 10 MG tablet Commonly known as: CRESTOR Take 10 mg by mouth every evening.   senna-docusate 8.6-50 MG tablet Commonly known as: Senokot-S Take 1 tablet by mouth 2 (two) times daily as needed for moderate constipation.   tamsulosin 0.4 MG Caps capsule Commonly known as: FLOMAX Take 1 capsule (0.4 mg total) by mouth at bedtime.   topiramate 50 MG tablet Commonly known as: TOPAMAX Take 1.5 tablets (75 mg total) by mouth 2 (two) times daily. What changed: when to take this   torsemide 20 MG tablet Commonly known as: DEMADEX Take 20 mg by mouth daily.   TRELEGY ELLIPTA IN Inhale 1 puff into the lungs daily.   warfarin 5 MG tablet Commonly known as: COUMADIN Take 5 mg by mouth every evening.        Follow-up Information     [facility] Follow up.   Why: ([facility])- HHPT/OT services to resume- they will contact you post discharge to schedule        Adrian Prows, MD. Schedule an appointment as soon as possible for a visit.   Specialty: Cardiology Contact information: [address] [place] [address] [phone]         Eustace Moore, MD. Schedule an appointment as soon as possible for a visit.   Specialty: Neurosurgery Contact information: [address] [place] [address] [phone]                Discharge Exam: Danley Danker Weights   02/12/22 0846  Weight: 107 kg   General-appears in no acute distress Heart-S1-S2, regular, no murmur auscultated Lungs-clear to auscultation bilaterally, no wheezing or crackles auscultated Abdomen-soft, nontender, no organomegaly Extremities-no edema in the lower extremities Neuro-alert, oriented x3, no  focal deficit noted  Condition at discharge: good  The results of significant diagnostics from this hospitalization (including imaging, microbiology, ancillary and laboratory) are listed below for reference.   Imaging Studies: CT HEAD WO CONTRAST (5MM)  Result Date: 02/16/2022 CLINICAL DATA:  TBI, follow-up ICH EXAM: CT HEAD WITHOUT CONTRAST TECHNIQUE: Contiguous axial images were obtained from the base of the skull through the vertex without intravenous contrast. RADIATION DOSE REDUCTION: This exam was performed according to the  departmental dose-optimization program which includes automated exposure control, adjustment of the mA and/or kV according to patient size and/or use of iterative reconstruction technique. COMPARISON:  Head CT from yesterday FINDINGS: Brain: No change in the parenchymal hemorrhage in the high and anterior right frontal lobe with local subarachnoid spread and mild edema in the adjacent brain. The hematoma measures up to 2.2 cm. No mass effect or new hemorrhage. No evidence of infarct or hydrocephalus. Vascular: No hyperdense vessel or unexpected calcification. Skull: Normal. Negative for fracture or focal lesion. Sinuses/Orbits: No acute finding. IMPRESSION: No progression of the right frontal hemorrhage Electronically Signed   By: Jorje Guild M.D.   On: 02/16/2022 05:27   DG Chest Port 1 View  Result Date: 02/15/2022 CLINICAL DATA:  Shortness of breath EXAM: PORTABLE CHEST 1 VIEW COMPARISON:  February 12, 2022 FINDINGS: The heart size and mediastinal contours are stable. The heart size is enlarged. Prior median sternotomy and CABG. Both lungs are clear. The visualized skeletal structures are unremarkable. IMPRESSION: No active disease. Electronically Signed   By: Abelardo Diesel M.D.   On: 02/15/2022 10:02   CT HEAD WO CONTRAST (5MM)  Result Date: 02/15/2022 CLINICAL DATA:  Change in mental status EXAM: CT HEAD WITHOUT CONTRAST TECHNIQUE: Contiguous axial images were  obtained from the base of the skull through the vertex without intravenous contrast. RADIATION DOSE REDUCTION: This exam was performed according to the departmental dose-optimization program which includes automated exposure control, adjustment of the mA and/or kV according to patient size and/or use of iterative reconstruction technique. COMPARISON:  Yesterday FINDINGS: Brain: High and anterior right frontal hematoma with adjacent edema and small volume subarachnoid hemorrhage, parenchymal component measuring 17 mm. No interval progression. No gray matter infarct, hydrocephalus, or shift. Vascular: No hyperdense vessel or unexpected calcification. Skull: Normal. Negative for fracture or focal lesion. Sinuses/Orbits: No acute finding. IMPRESSION: Unchanged right frontal lobe hemorrhage with local subarachnoid spread and edema. No new or progressive finding. Electronically Signed   By: Jorje Guild M.D.   On: 02/15/2022 09:34   CT HEAD WO CONTRAST (5MM)  Result Date: 02/14/2022 CLINICAL DATA:  Subarachnoid hemorrhage follow-up. EXAM: CT HEAD WITHOUT CONTRAST TECHNIQUE: Contiguous axial images were obtained from the base of the skull through the vertex without intravenous contrast. RADIATION DOSE REDUCTION: This exam was performed according to the departmental dose-optimization program which includes automated exposure control, adjustment of the mA and/or kV according to patient size and/or use of iterative reconstruction technique. COMPARISON:  CT head without contrast 02/12/2022 FINDINGS: Brain: Previous area of hemorrhagic contusion in the anterior right frontal lobe has expanded. The parenchymal hemorrhage now measures 2.3 x 1.7 x 1.4 cm with increased surrounding edema. Subarachnoid and subdural hemorrhage along the falx is similar the prior exam. No distal hemorrhage is present. Mild atrophy and white matter changes are stable. The ventricles are of normal size. The brainstem and cerebellum are within  normal limits. Vascular: Atherosclerotic calcifications are present within the cavernous internal carotid arteries bilaterally. No hyperdense vessel is present. Skull: Calvarium is intact. No focal lytic or blastic lesions are present. No significant extracranial soft tissue lesion is present. Sinuses/Orbits: The paranasal sinuses and mastoid air cells are clear. The globes and orbits are within normal limits. IMPRESSION: 1. Previous area of hemorrhagic contusion in the anterior right frontal lobe has expanded. The parenchymal hemorrhage now measures 2.3 x 1.7 x 1.4 cm with increased surrounding edema. 2. Subarachnoid and subdural hemorrhage along the falx is similar the prior exam.  3. Stable atrophy and white matter disease. Critical Value/emergent results were called by telephone at the time of interpretation on 02/14/2022 at 5:44 pm to provider [facility] , who verbally acknowledged these results. Electronically Signed   By: San Morelle M.D.   On: 02/14/2022 17:44   CT CORONARY MORPH W/CTA COR W/SCORE W/CA W/CM &/OR WO/CM  Addendum Date: 02/13/2022   ADDENDUM REPORT: 02/13/2022 17:53 CLINICAL DATA:  9M with possible severe aortic stenosis being evaluated for a TAVR procedure. EXAM: Cardiac TAVR CT TECHNIQUE: The patient was scanned on a [employer]. A 120 kV retrospective scan was triggered in the descending thoracic aorta at 111 HU's. Gantry rotation speed was 250 msecs and collimation was .6 mm. No beta blockade or nitro were given. The 3D data set was reconstructed in 5% intervals of the R-R cycle. Systolic and diastolic phases were analyzed on a dedicated work station using MPR, MIP and VRT modes. The patient received 100 cc of contrast. FINDINGS: Aortic Root: Aortic valve: Trileaflet Aortic valve calcium score: 1194 Aortic annulus: Diameter: 63mm x 98mm Perimeter: 35mm Area: 417 mm^2 Calcifications: No calcifications Coronary height: Min Left - 73mm,  Min Right - 76mm Sinotubular  height: Left cusp - 27mm; Right cusp - 74mm; Noncoronary cusp - 45mm LVOT (as measured 3 mm below the annulus): Diameter: 68mm x 52mm Area: 403 mm^2 Calcifications: No calcifications Aortic sinus width: Left cusp - 21mm; Right cusp - 62mm; Noncoronary cusp -60mm Sinotubular junction width: 20mm x 71mm Optimum Fluoroscopic Angle for Delivery: LAO 17 CAU 8 Cardiac: Right atrium: Normal size Right ventricle: Moderate dilatation Pulmonary arteries: Normal size Pulmonary veins: Normal configuration Left atrium: Mild enlargement Left ventricle: Moderate dilatation Pericardium: Normal thickness Coronary arteries: S/p CABG with patent LIMA-LAD, SVG-RCA, and SVG-OM1 IMPRESSION: 1. Trileaflet aortic valve with moderate calcifications (AV calcium score 1194). AV calcium score and visual evaluation of valve opening suggest more likely moderate aortic stenosis 2. Aortic annulus measures 54mm x 34mm in diameter with perimeter 10mm and area 417 mm^2. No annular or LVOT calcifications. Annular measurements are suitable for delivery of 40mm Edwards Sapien 3 valve. 3. Sufficient coronary to annulus distance. 4. Optimum Fluoroscopic Angle for Delivery:  LAO 17 CAU 8 5. S/p CABG with patent LIMA-LAD, SVG-RCA, and SVG-OM1 Electronically Signed   By: Oswaldo Milian M.D.   On: 02/13/2022 17:53   Result Date: 02/13/2022 EXAM: OVER-READ INTERPRETATION  CT CHEST The following report is a limited chest CT over-read performed by radiologist Dr. Salvatore Marvel of [facility] [place] on 02/13/2022. This over-read does not include interpretation of cardiac or coronary anatomy or pathology. The cardiac CTA interpretation by the cardiologist is attached. COMPARISON:  02/12/2022 chest CT angiogram. FINDINGS: Please see the separate concurrent chest CT angiogram report for details. IMPRESSION: Please see the separate concurrent chest CT angiogram report for details. Electronically Signed: By: Ilona Sorrel M.D. On: 02/13/2022 10:46    CARDIAC CATHETERIZATION  Result Date: 02/13/2022 Images from the original result were not included. Right left Heart Catheterization 02/13/22: RA 8/7, mean 6 mmHg. RV 47/1, EDP 10 mmHg; PA 43/14, mean 25 mmHg.  PA saturation 59%.  Aortic saturation 98%.  PW 8/8, mean 7 mmHg. QP/QS 1.00.  CO 5.91/CI 2.73 LV: 131/3, EDP 19 mmHg.  Ao 1 and 14/42, mean 67 mmHg.  Peak to peak pressure gradient 23 and mean pressure gradient 30 mmHg.  Calculated aortic valve area 1.22 cm. LV gram not performed. LM: Large-caliber vessel, normal. LAD: Gives origin  to a very large D1 and D2, has ostial 20 to 30% stenosis in the diagonals.  LAD itself is very mild disease.  Distal LAD has come to the feeling from the LIMA to LAD. LIMA to LAD: Widely patent. LCx: Proximal segment has a 40 to 50% stenosis.  Distal CX is occluded. SVG to OM1-distal CX jump graft: Widely patent. Impression: Widely patent native vessels on the left system, no significant stenosis was identified however has been grafted.  Only lesion that was felt to be significant was distal circumflex which is occluded.  Right coronary artery has a prior stent that is occluded, very large RCA which is now bypassed and graft retrogradely fills the native RCA all the way back to the stent at the ostium.  70 mL contrast utilized. Yates Decamp, MD, [facility] 02/13/2022, 4:22 PM Office: [phone] Fax: [phone] Pager: [phone]   CT ANGIO ABDOMEN PELVIS  W &/OR WO CONTRAST  Result Date: 02/13/2022 CLINICAL DATA:  Inpatient. Aortic valve replacement (TAVR), pre-op eval aortic stenosis; Aortic valve replacement (TAVR), pre-op eval EXAM: CT ANGIOGRAPHY CHEST, ABDOMEN AND PELVIS TECHNIQUE: Multidetector CT imaging through the chest, abdomen and pelvis was performed using the standard protocol during bolus administration of intravenous contrast. Multiplanar reconstructed images and MIPs were obtained and reviewed to evaluate the vascular anatomy. RADIATION DOSE REDUCTION: This  exam was performed according to the departmental dose-optimization program which includes automated exposure control, adjustment of the mA and/or kV according to patient size and/or use of iterative reconstruction technique. CONTRAST:  OMNIPAQUE IOHEXOL 350 MG/ML SOLN COMPARISON:  02/12/2022 chest CT angiogram and CT abdomen/pelvis. FINDINGS: CTA CHEST FINDINGS Cardiovascular: Borderline mild cardiomegaly. Diffuse thickening and coarse calcification of the aortic valve. No significant pericardial effusion/thickening. Three-vessel coronary atherosclerosis status post CABG. Atherosclerotic nonaneurysmal thoracic aorta. Normal caliber pulmonary arteries. No central pulmonary emboli. Mediastinum/Nodes: No significant thyroid nodules. Unremarkable esophagus. No axillary adenopathy. Mildly enlarged 1.0 cm right paratracheal node (series 7/image 41), decreased from 1.3 cm on 02/02/2022 CT. No additional pathologically enlarged mediastinal nodes. No hilar adenopathy. Lungs/Pleura: No pneumothorax. No pleural effusion. Patchy tree-in-bud opacities and nodular 1.5 cm focus of consolidation in the right lower lobe (series 8/image 131), new from 02/02/2022 CT and not appreciably changed from 02/12/2022 CT. Tiny 0.2 cm apical right upper lobe nodule (series 8/image 55), stable since 07/13/2020 CT, considered benign. Musculoskeletal: No aggressive appearing focal osseous lesions. Intact sternotomy wires. Moderate thoracic spondylosis. CTA ABDOMEN AND PELVIS FINDINGS Hepatobiliary: Normal liver with no liver mass. Normal gallbladder with no radiopaque cholelithiasis. No biliary ductal dilatation. Pancreas: Normal, with no mass or duct dilation. Spleen: Normal size. No mass. Adrenals/Urinary Tract: Normal adrenals. No hydronephrosis. Nonobstructing 11 mm interpolar right and 3 mm interpolar left renal stones. No contour deforming renal masses. Normal bladder. Stomach/Bowel: Normal non-distended stomach. Normal caliber small  bowel with no small bowel wall thickening. Appendectomy. Mild scattered colonic diverticulosis with no large bowel wall thickening or significant pericolonic fat stranding. Vascular/Lymphatic: Atherosclerotic nonaneurysmal abdominal aorta. Patent renal, portal and splenic veins. Mildly enlarged 1.3 cm right external iliac lymph node (series 7/image 192) and mildly enlarged 1.0 cm left external iliac lymph node (series 7/image 183), both stable since 02/02/2022 CT. No pathologically enlarged abdominal lymph nodes. Reproductive: Normal size prostate. Other: No pneumoperitoneum, ascites or focal fluid collection. Medial right gluteal subcutaneous hyperdense focus measuring 3.7 cm thickness (series 7/image 178), unchanged since 02/02/2022 CT, new from 01/20/2020 CT, most compatible with a subcutaneous hematoma. Musculoskeletal: No aggressive appearing focal osseous  lesions. Moderate lumbar spondylosis. VASCULAR MEASUREMENTS PERTINENT TO TAVR: AORTA: Minimal Aortic Diameter-10.5 x 6.7 mm Severity of Aortic Calcification-moderate-to-severe RIGHT PELVIS: Right Common Iliac Artery - Minimal Diameter-7.0 x 5.2 mm Tortuosity-mild-to-moderate Calcification-severe Right External Iliac Artery - Minimal Diameter-6.6 x 5.1 mm Tortuosity-mild to moderate Calcification-moderate to severe Right Common Femoral Artery - Minimal Diameter-7.3 x 4.7 mm Tortuosity-mild Calcification-moderate to severe LEFT PELVIS: Left Common Iliac Artery - Minimal Diameter-7.3 x 6.2 mm (measured on series 6/image 459 as [employer] had difficulty with the center line at this site) Tortuosity-moderate Calcification-severe Left External Iliac Artery - Minimal Diameter-6.2 x 6.1 mm Tortuosity-moderate Calcification-moderate Left Common Femoral Artery - Minimal Diameter-7.2 x 6.0 mm Tortuosity-mild Calcification-moderate Review of the MIP images confirms the above findings. IMPRESSION: 1. Vascular findings and measurements pertinent to potential TAVR procedure,  as detailed. 2. Diffuse thickening and coarse calcification of the aortic valve, compatible with the reported history of aortic stenosis. 3. Borderline mild cardiomegaly. 4. Patchy tree-in-bud opacities and nodular 1.5 cm focus of consolidation in the right lower lobe, new from 02/02/2022 CT and not appreciably changed from 02/12/2022 CT, most compatible with a nonspecific mild bronchopneumonia such as due to aspiration. Recommend attention on follow-up chest CT in 3 months. 5. Nonspecific mild mediastinal and bilateral external iliac lymphadenopathy, stable since 02/02/2022 CT. Recommend attention on follow-up CT chest, abdomen and pelvis with oral and IV contrast in 3 months. 6. Stable subcutaneous hematoma in the medial right gluteal region. 7. Nonobstructing bilateral nephrolithiasis. 8. Mild scattered colonic diverticulosis. 9.  Aortic Atherosclerosis (ICD10-I70.0). Electronically Signed   By: Ilona Sorrel M.D.   On: 02/13/2022 12:14   CT ANGIO CHEST AORTA W/CM & OR WO/CM  Result Date: 02/13/2022 CLINICAL DATA:  Inpatient. Aortic valve replacement (TAVR), pre-op eval aortic stenosis; Aortic valve replacement (TAVR), pre-op eval EXAM: CT ANGIOGRAPHY CHEST, ABDOMEN AND PELVIS TECHNIQUE: Multidetector CT imaging through the chest, abdomen and pelvis was performed using the standard protocol during bolus administration of intravenous contrast. Multiplanar reconstructed images and MIPs were obtained and reviewed to evaluate the vascular anatomy. RADIATION DOSE REDUCTION: This exam was performed according to the departmental dose-optimization program which includes automated exposure control, adjustment of the mA and/or kV according to patient size and/or use of iterative reconstruction technique. CONTRAST:  153mL OMNIPAQUE IOHEXOL 350 MG/ML SOLN COMPARISON:  02/12/2022 chest CT angiogram and CT abdomen/pelvis. FINDINGS: CTA CHEST FINDINGS Cardiovascular: Borderline mild cardiomegaly. Diffuse thickening and  coarse calcification of the aortic valve. No significant pericardial effusion/thickening. Three-vessel coronary atherosclerosis status post CABG. Atherosclerotic nonaneurysmal thoracic aorta. Normal caliber pulmonary arteries. No central pulmonary emboli. Mediastinum/Nodes: No significant thyroid nodules. Unremarkable esophagus. No axillary adenopathy. Mildly enlarged 1.0 cm right paratracheal node (series 7/image 41), decreased from 1.3 cm on 02/02/2022 CT. No additional pathologically enlarged mediastinal nodes. No hilar adenopathy. Lungs/Pleura: No pneumothorax. No pleural effusion. Patchy tree-in-bud opacities and nodular 1.5 cm focus of consolidation in the right lower lobe (series 8/image 131), new from 02/02/2022 CT and not appreciably changed from 02/12/2022 CT. Tiny 0.2 cm apical right upper lobe nodule (series 8/image 55), stable since 07/13/2020 CT, considered benign. Musculoskeletal: No aggressive appearing focal osseous lesions. Intact sternotomy wires. Moderate thoracic spondylosis. CTA ABDOMEN AND PELVIS FINDINGS Hepatobiliary: Normal liver with no liver mass. Normal gallbladder with no radiopaque cholelithiasis. No biliary ductal dilatation. Pancreas: Normal, with no mass or duct dilation. Spleen: Normal size. No mass. Adrenals/Urinary Tract: Normal adrenals. No hydronephrosis. Nonobstructing 11 mm interpolar right and 3 mm interpolar left renal stones. No  contour deforming renal masses. Normal bladder. Stomach/Bowel: Normal non-distended stomach. Normal caliber small bowel with no small bowel wall thickening. Appendectomy. Mild scattered colonic diverticulosis with no large bowel wall thickening or significant pericolonic fat stranding. Vascular/Lymphatic: Atherosclerotic nonaneurysmal abdominal aorta. Patent renal, portal and splenic veins. Mildly enlarged 1.3 cm right external iliac lymph node (series 7/image 192) and mildly enlarged 1.0 cm left external iliac lymph node (series 7/image 183), both  stable since 02/02/2022 CT. No pathologically enlarged abdominal lymph nodes. Reproductive: Normal size prostate. Other: No pneumoperitoneum, ascites or focal fluid collection. Medial right gluteal subcutaneous hyperdense focus measuring 3.7 cm thickness (series 7/image 178), unchanged since 02/02/2022 CT, new from 01/20/2020 CT, most compatible with a subcutaneous hematoma. Musculoskeletal: No aggressive appearing focal osseous lesions. Moderate lumbar spondylosis. VASCULAR MEASUREMENTS PERTINENT TO TAVR: AORTA: Minimal Aortic Diameter-10.5 x 6.7 mm Severity of Aortic Calcification-moderate-to-severe RIGHT PELVIS: Right Common Iliac Artery - Minimal Diameter-7.0 x 5.2 mm Tortuosity-mild-to-moderate Calcification-severe Right External Iliac Artery - Minimal Diameter-6.6 x 5.1 mm Tortuosity-mild to moderate Calcification-moderate to severe Right Common Femoral Artery - Minimal Diameter-7.3 x 4.7 mm Tortuosity-mild Calcification-moderate to severe LEFT PELVIS: Left Common Iliac Artery - Minimal Diameter-7.3 x 6.2 mm (measured on series 6/image 459 as [employer] had difficulty with the center line at this site) Tortuosity-moderate Calcification-severe Left External Iliac Artery - Minimal Diameter-6.2 x 6.1 mm Tortuosity-moderate Calcification-moderate Left Common Femoral Artery - Minimal Diameter-7.2 x 6.0 mm Tortuosity-mild Calcification-moderate Review of the MIP images confirms the above findings. IMPRESSION: 1. Vascular findings and measurements pertinent to potential TAVR procedure, as detailed. 2. Diffuse thickening and coarse calcification of the aortic valve, compatible with the reported history of aortic stenosis. 3. Borderline mild cardiomegaly. 4. Patchy tree-in-bud opacities and nodular 1.5 cm focus of consolidation in the right lower lobe, new from 02/02/2022 CT and not appreciably changed from 02/12/2022 CT, most compatible with a nonspecific mild bronchopneumonia such as due to aspiration. Recommend  attention on follow-up chest CT in 3 months. 5. Nonspecific mild mediastinal and bilateral external iliac lymphadenopathy, stable since 02/02/2022 CT. Recommend attention on follow-up CT chest, abdomen and pelvis with oral and IV contrast in 3 months. 6. Stable subcutaneous hematoma in the medial right gluteal region. 7. Nonobstructing bilateral nephrolithiasis. 8. Mild scattered colonic diverticulosis. 9.  Aortic Atherosclerosis (ICD10-I70.0). Electronically Signed   By: Ilona Sorrel M.D.   On: 02/13/2022 12:14   CT HEAD WO CONTRAST (5MM)  Result Date: 02/12/2022 CLINICAL DATA:  Fall EXAM: CT HEAD WITHOUT CONTRAST TECHNIQUE: Contiguous axial images were obtained from the base of the skull through the vertex without intravenous contrast. RADIATION DOSE REDUCTION: This exam was performed according to the departmental dose-optimization program which includes automated exposure control, adjustment of the mA and/or kV according to patient size and/or use of iterative reconstruction technique. COMPARISON:  CT head obtained earlier the same day. FINDINGS: Brain: The right parafalcine extra-axial hematoma is slightly increased in size measuring up to 5 mm in thickness in the coronal plane, previously measured up to 4 mm. The hematoma now measures up to 1.5 cm in the craniocaudad dimension in the coronal plane, previously measured 0.9 cm. There is no mass effect on the underlying brain parenchyma. There is no other acute intracranial hemorrhage. There is no acute territorial infarct. The ventricles are stable in size. Gray-white differentiation is preserved There is no solid mass lesion.  There is no midline shift. Vascular: There is calcification of the bilateral carotid siphons and vertebral arteries. Skull: Normal. Negative for fracture  or focal lesion. Sinuses/Orbits: The paranasal sinuses are clear. The globes and orbits are unremarkable. Other: None. IMPRESSION: Slight interval increase in size of the small right  parafalcine extra-axial hematoma without mass effect or midline shift. Electronically Signed   By: Valetta Mole M.D.   On: 02/12/2022 08:17   CT ABDOMEN PELVIS W CONTRAST  Result Date: 02/12/2022 CLINICAL DATA:  Syncope. EXAM: CT ABDOMEN AND PELVIS WITH CONTRAST TECHNIQUE: Multidetector CT imaging of the abdomen and pelvis was performed using the standard protocol following bolus administration of intravenous contrast. RADIATION DOSE REDUCTION: This exam was performed according to the departmental dose-optimization program which includes automated exposure control, adjustment of the mA and/or kV according to patient size and/or use of iterative reconstruction technique. CONTRAST:  184mL OMNIPAQUE IOHEXOL 350 MG/ML SOLN COMPARISON:  February 02, 2022 FINDINGS: Lower chest: Multiple sternal wires are noted. Mild, posterior right basilar atelectasis and/or early infiltrate is seen. Hepatobiliary: No focal liver abnormality is seen. No gallstones, gallbladder wall thickening, or biliary dilatation. Pancreas: Unremarkable. No pancreatic ductal dilatation or surrounding inflammatory changes. Spleen: Normal in size without focal abnormality. Adrenals/Urinary Tract: Adrenal glands are unremarkable. Kidneys are normal in size, without obstructing renal calculi, focal lesion, or hydronephrosis. A 12 mm nonobstructing renal calculus is seen within the mid right kidney. A 2 mm nonobstructing renal calculus is also seen within the lower pole of the left kidney. The urinary bladder is markedly distended and is unremarkable. Stomach/Bowel: Stomach is within normal limits. The appendix is surgically absent. No evidence of bowel wall thickening, distention, or inflammatory changes. Noninflamed diverticula are seen throughout the large bowel. Vascular/Lymphatic: Aortic atherosclerosis. No enlarged abdominal or pelvic lymph nodes. Reproductive: The prostate gland is not clearly identified. Other: No abdominal wall hernia or  abnormality. No abdominopelvic ascites. Musculoskeletal: Acute, nondisplaced fracture is seen within the anterior seventh left rib. Multilevel degenerative changes seen throughout the lumbar spine. IMPRESSION: 1. Mild, posterior right basilar atelectasis and/or early infiltrate. 2. Anterior seventh left rib fracture. 3. Bilateral nonobstructing renal calculi. 4. Colonic diverticulosis. 5. Aortic atherosclerosis. Aortic Atherosclerosis (ICD10-I70.0). Electronically Signed   By: Virgina Norfolk M.D.   On: 02/12/2022 02:08   CT HEAD WO CONTRAST (5MM)  Result Date: 02/12/2022 CLINICAL DATA:  Fall while on anti coagulation EXAM: CT HEAD WITHOUT CONTRAST CT CERVICAL SPINE WITHOUT CONTRAST TECHNIQUE: Multidetector CT imaging of the head and cervical spine was performed following the standard protocol without intravenous contrast. Multiplanar CT image reconstructions of the cervical spine were also generated. RADIATION DOSE REDUCTION: This exam was performed according to the departmental dose-optimization program which includes automated exposure control, adjustment of the mA and/or kV according to patient size and/or use of iterative reconstruction technique. COMPARISON:  None Available. FINDINGS: CT HEAD FINDINGS Brain: Small area of subarachnoid hemorrhage at the superior medial right convexity. The size and configuration of the ventricles and extra-axial CSF spaces are normal. The brain parenchyma is normal, without evidence of acute or chronic infarction. Vascular: No abnormal hyperdensity of the major intracranial arteries or dural venous sinuses. No intracranial atherosclerosis. Skull: The visualized skull base, calvarium and extracranial soft tissues are normal. Sinuses/Orbits: No fluid levels or advanced mucosal thickening of the visualized paranasal sinuses. No mastoid or middle ear effusion. The orbits are normal. CT CERVICAL SPINE FINDINGS Alignment: No static subluxation. Facets are aligned. Occipital  condyles are normally positioned. Skull base and vertebrae: No acute fracture. Soft tissues and spinal canal: No prevertebral fluid or swelling. No visible canal hematoma. Disc levels:  No advanced spinal canal or neural foraminal stenosis. Upper chest: No pneumothorax, pulmonary nodule or pleural effusion. Other: Normal visualized paraspinal cervical soft tissues. IMPRESSION: 1. Small area of subarachnoid hemorrhage at the superior medial right convexity. 2. No acute fracture or static subluxation of the cervical spine. Critical Value/emergent results were called by telephone at the time of interpretation on 02/12/2022 at 2:07 am to [facility] , who verbally acknowledged these results. Electronically Signed   By: Deatra RobinsonKevin  Herman M.D.   On: 02/12/2022 02:07   CT CERVICAL SPINE WO CONTRAST  Result Date: 02/12/2022 CLINICAL DATA:  Fall while on anti coagulation EXAM: CT HEAD WITHOUT CONTRAST CT CERVICAL SPINE WITHOUT CONTRAST TECHNIQUE: Multidetector CT imaging of the head and cervical spine was performed following the standard protocol without intravenous contrast. Multiplanar CT image reconstructions of the cervical spine were also generated. RADIATION DOSE REDUCTION: This exam was performed according to the departmental dose-optimization program which includes automated exposure control, adjustment of the mA and/or kV according to patient size and/or use of iterative reconstruction technique. COMPARISON:  None Available. FINDINGS: CT HEAD FINDINGS Brain: Small area of subarachnoid hemorrhage at the superior medial right convexity. The size and configuration of the ventricles and extra-axial CSF spaces are normal. The brain parenchyma is normal, without evidence of acute or chronic infarction. Vascular: No abnormal hyperdensity of the major intracranial arteries or dural venous sinuses. No intracranial atherosclerosis. Skull: The visualized skull base, calvarium and extracranial soft tissues are normal.  Sinuses/Orbits: No fluid levels or advanced mucosal thickening of the visualized paranasal sinuses. No mastoid or middle ear effusion. The orbits are normal. CT CERVICAL SPINE FINDINGS Alignment: No static subluxation. Facets are aligned. Occipital condyles are normally positioned. Skull base and vertebrae: No acute fracture. Soft tissues and spinal canal: No prevertebral fluid or swelling. No visible canal hematoma. Disc levels: No advanced spinal canal or neural foraminal stenosis. Upper chest: No pneumothorax, pulmonary nodule or pleural effusion. Other: Normal visualized paraspinal cervical soft tissues. IMPRESSION: 1. Small area of subarachnoid hemorrhage at the superior medial right convexity. 2. No acute fracture or static subluxation of the cervical spine. Critical Value/emergent results were called by telephone at the time of interpretation on 02/12/2022 at 2:07 am to [facility] , who verbally acknowledged these results. Electronically Signed   By: Deatra RobinsonKevin  Herman M.D.   On: 02/12/2022 02:07   CT L-SPINE NO CHARGE  Result Date: 02/12/2022 CLINICAL DATA:  Fall EXAM: CT LUMBAR SPINE WITHOUT CONTRAST TECHNIQUE: Multidetector CT imaging of the lumbar spine was performed without intravenous contrast administration. Multiplanar CT image reconstructions were also generated. RADIATION DOSE REDUCTION: This exam was performed according to the departmental dose-optimization program which includes automated exposure control, adjustment of the mA and/or kV according to patient size and/or use of iterative reconstruction technique. COMPARISON:  None Available. FINDINGS: Segmentation: 5 lumbar type vertebrae. Alignment: Normal. Vertebrae: Wedge compression deformity of L1 is age indeterminate but unchanged since 01/22/2022. Disc levels: There is mild spinal canal stenosis at L2-3 and L3-4. Multilevel facet arthrosis and foraminal stenosis, worst at L2-3. IMPRESSION: 1. Wedge compression deformity of L1 is  age indeterminate but unchanged since 01/22/2022. 2. Multilevel facet arthrosis and foraminal stenosis, worst at L2-3. Electronically Signed   By: Deatra RobinsonKevin  Herman M.D.   On: 02/12/2022 02:04   CT Angio Chest Pulmonary Embolism (PE) W or WO Contrast  Result Date: 02/12/2022 CLINICAL DATA:  Syncope and subsequent fall. EXAM: CT ANGIOGRAPHY CHEST WITH CONTRAST TECHNIQUE: Multidetector CT imaging  of the chest was performed using the standard protocol during bolus administration of intravenous contrast. Multiplanar CT image reconstructions and MIPs were obtained to evaluate the vascular anatomy. RADIATION DOSE REDUCTION: This exam was performed according to the departmental dose-optimization program which includes automated exposure control, adjustment of the mA and/or kV according to patient size and/or use of iterative reconstruction technique. CONTRAST:  118mL OMNIPAQUE IOHEXOL 350 MG/ML SOLN COMPARISON:  February 02, 2022 FINDINGS: Cardiovascular: There is mild calcification of the aortic arch, without evidence of aortic aneurysm. Satisfactory opacification of the pulmonary arteries to the segmental level. No evidence of pulmonary embolism. There is mild to moderate severity cardiomegaly with marked severity coronary artery calcification and coronary artery vascular clips. No pericardial effusion. Mediastinum/Nodes: Multiple sternal wires are noted. No enlarged mediastinal, hilar, or axillary lymph nodes. A stable subcentimeter thyroid nodule is seen within the posterior aspect of the left lobe of the thyroid gland. The trachea and esophagus demonstrate no significant findings. Lungs/Pleura: Mild atelectasis and/or early infiltrate is seen within the posterior aspect of the right lung base. There is no evidence of a pleural effusion or pneumothorax. Upper Abdomen: No acute abnormality. Musculoskeletal: Acute anterior seventh left rib fracture is seen. A chronic posterolateral second left rib fracture is also  noted. Multilevel degenerative changes seen throughout the thoracic spine. Review of the MIP images confirms the above findings. IMPRESSION: 1. No evidence of pulmonary embolism. 2. Mild right basilar atelectasis and/or early infiltrate. 3. Evidence of prior median sternotomy/CABG. 4. Anterior seventh left rib fracture. 5. Aortic atherosclerosis. Aortic Atherosclerosis (ICD10-I70.0). Electronically Signed   By: Virgina Norfolk M.D.   On: 02/12/2022 02:02   DG Chest Port 1 View  Result Date: 02/12/2022 CLINICAL DATA:  Recent fall EXAM: PORTABLE CHEST 1 VIEW COMPARISON:  01/21/2022 FINDINGS: Cardiac shadow is enlarged. Postsurgical changes are noted. Lungs are clear bilaterally. No bony abnormality is noted. IMPRESSION: No active disease. Electronically Signed   By: Inez Catalina M.D.   On: 02/12/2022 00:49   DG CHEST PORT 1 VIEW  Result Date: 02/03/2022 CLINICAL DATA:  Fever EXAM: PORTABLE CHEST 1 VIEW COMPARISON:  Previous studies including the examination of 02/02/2022 FINDINGS: Transverse diameter of heart is increased. There is previous coronary bypass surgery. Tip of endotracheal tube is 5.4 cm above the carina. Enteric tube is noted traversing the esophagus. Increased markings are seen in right lower lung fields. There is no focal consolidation. There is no pleural effusion or pneumothorax. IMPRESSION: Cardiomegaly. There are no signs of alveolar pulmonary edema. Increased interstitial markings are seen in right lower lung fields suggesting atelectasis/pneumonia. Electronically Signed   By: Elmer Picker M.D.   On: 02/03/2022 12:34   ECHOCARDIOGRAM COMPLETE  Result Date: 02/03/2022    ECHOCARDIOGRAM REPORT   Patient Name:   Mancel Mohler Date of Exam: 02/03/2022 Medical Rec #:  [id number]     Height:       70.0 in Accession #:    [id number]    Weight:       239.4 lb Date of Birth:  11/25/1955      BSA:          2.253 m Patient Age:    39 years      BP:           92/51 mmHg Patient Gender: M              HR:           86 bpm. Exam Location:  Inpatient  Procedure: 2D Echo, Cardiac Doppler, Color Doppler and Intracardiac            Opacification Agent Indications:    Syncope R55  History:        Patient has no prior history of Echocardiogram examinations.                 CAD, Prior CABG, COPD and PAD, Aortic Valve Disease; Risk                 Factors:Current Smoker. 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  Sonographer:    Leta Jungling RDCS Referring Phys: [id number] Aliene Beams  Sonographer Comments: Suboptimal apical window, suboptimal parasternal window, suboptimal subcostal window, echo performed with patient supine and on artificial respirator and Technically difficult study due to poor echo windows. IMPRESSIONS  1. Peak gradient of 33 mmHg despite mildly decrease LV stroke volume. DVI 0.26. Gradients were obtained ~ 1 minute after contrast administration with no contrast in the LV cavity. 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 The aortic valve is calcified. There is moderate calcification of the aortic valve. There is moderate thickening of the aortic valve. Aortic valve regurgitation is mild. Severe aortic valve stenosis. Aortic valve area, by VTI measures 0.80 cm. Aortic valve mean gradient measures 33.0 mmHg. Aortic valve Vmax measures 3.66 m/s.  2. Left ventricular ejection fraction, by estimation, is 55 to 60%. The left ventricle has normal function. The left ventricle has no regional wall motion abnormalities. The left ventricular internal cavity size was mildly dilated. Left ventricular diastolic parameters are indeterminate.  3. Right ventricular systolic function was not well visualized. The right ventricular size is not well visualized.  4. The mitral valve is grossly normal. No evidence of mitral valve regurgitation. No evidence of mitral stenosis.  5. The inferior vena cava is dilated in size with >50% respiratory  variability, suggesting right atrial pressure of 8 mmHg. Comparison(s): No prior Echocardiogram. FINDINGS  Left Ventricle: Left ventricular ejection fraction, by estimation, is 55 to 60%. The left ventricle has normal function. The left ventricle has no regional wall motion abnormalities. Definity contrast agent was given IV to delineate the left ventricular  endocardial borders. The left ventricular internal cavity size was mildly dilated. There is no left ventricular hypertrophy. Left ventricular diastolic parameters are indeterminate. Right Ventricle: The right ventricular size is not well visualized. Right vetricular wall thickness was not well visualized. Right ventricular systolic function was not well visualized. Left Atrium: Left atrial size was normal in size. Right Atrium: Right atrial size was not well visualized. Pericardium: Trivial pericardial effusion is present. Mitral Valve: The mitral valve is grossly normal. No evidence of mitral valve regurgitation. No evidence of mitral valve stenosis. Tricuspid Valve: The tricuspid valve is not well visualized. Tricuspid valve regurgitation is not demonstrated. No evidence of tricuspid stenosis. Aortic Valve: Peak gradient of 33 mmHg despite mildly decrease LV stroke volume. DVI 0.26. Gradients were obtained ~ 1 minute after contrast administration with no contrast in the LV cavity. 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 The aortic valve is calcified. There is moderate calcification of the aortic valve. There is moderate thickening of the aortic valve. There is moderate aortic valve annular calcification. Aortic valve regurgitation is mild. Severe aortic stenosis is present. Aortic valve mean gradient measures 33.0 mmHg. Aortic valve peak gradient measures 53.6 mmHg. Aortic  valve area, by VTI measures 0.80 cm. Pulmonic Valve: The pulmonic valve was not well visualized. Pulmonic valve regurgitation is trivial. No evidence of pulmonic  stenosis. Aorta: The aortic root is normal in size and structure. Venous: The inferior vena cava is dilated in size with greater than 50% respiratory variability, suggesting right atrial pressure of 8 mmHg. IAS/Shunts: No atrial level shunt detected by color flow Doppler.  LEFT VENTRICLE PLAX 2D LVIDd:         5.98 cm      Diastology LVIDs:         4.99 cm      LV e' medial:    4.62 cm/s LV PW:         0.85 cm      LV E/e' medial:  23.4 LV IVS:        0.91 cm      LV e' lateral:   6.16 cm/s LVOT diam:     2.00 cm      LV E/e' lateral: 17.5 LV SV:         68 LV SV Index:   30 LVOT Area:     3.14 cm  LV Volumes (MOD) LV vol d, MOD A2C: 164.0 ml LV vol d, MOD A4C: 186.0 ml LV vol s, MOD A2C: 69.9 ml LV vol s, MOD A4C: 78.3 ml LV SV MOD A2C:     94.1 ml LV SV MOD A4C:     186.0 ml LV SV MOD BP:      101.4 ml LEFT ATRIUM             Index LA diam:        3.70 cm 1.64 cm/m LA Vol (A2C):   48.6 ml 21.57 ml/m LA Vol (A4C):   57.9 ml 25.70 ml/m LA Biplane Vol: 56.5 ml 25.08 ml/m  AORTIC VALVE AV Area (Vmax):    0.94 cm AV Area (Vmean):   0.97 cm AV Area (VTI):     0.80 cm AV Vmax:           366.00 cm/s AV Vmean:          263.500 cm/s AV VTI:            0.840 m AV Peak Grad:      53.6 mmHg AV Mean Grad:      33.0 mmHg LVOT Vmax:         110.00 cm/s LVOT Vmean:        81.700 cm/s LVOT VTI:          0.215 m LVOT/AV VTI ratio: 0.26  AORTA Ao Root diam: 2.60 cm MITRAL VALVE MV Area (PHT): 4.26 cm     SHUNTS MV Decel Time: 178 msec     Systemic VTI:  0.22 m MV E velocity: 108.00 cm/s  Systemic Diam: 2.00 cm MV A velocity: 90.30 cm/s MV E/A ratio:  1.20 Rudean Haskell MD Electronically signed by Rudean Haskell MD Signature Date/Time: 02/03/2022/10:25:21 AM    Final    Overnight EEG with video  Result Date: 02/03/2022 Lora Havens, MD     02/04/2022  8:52 AM Patient Name: Leith Search MRN: [id number] Epilepsy Attending: Lora Havens Referring Physician/Provider: Julian Hy, DO Duration:  02/02/2022 1055 to 02/03/2022 1055  Patient history: 66 year old male with altered mental status.  EEG to evaluate for seizure.  Level of alertness: comatose  AEDs during EEG study: GBP, TPM, Propofol  Technical aspects: This EEG study was done  with scalp electrodes positioned according to the 10-20 International system of electrode placement. Electrical activity was reviewed with band pass filter of 1-70Hz , sensitivity of 7 uV/mm, display speed of 59mm/sec with a 60Hz  notched filter applied as appropriate. EEG data were recorded continuously and digitally stored.  Video monitoring was available and reviewed as appropriate.  Description: EEG showed continuous generalized 3 to 6 Hz theta-delta slowing admixed with intermittent generalized 15 to 18 Hz beta activity. Hyperventilation and photic stimulation were not performed.    ABNORMALITY - Continuous slow, generalized  IMPRESSION: This study is suggestive of severe diffuse encephalopathy, nonspecific etiology but likely due to sedation. No seizures or epileptiform discharges were seen throughout the recording.  Lora Havens    CT ANGIO HEAD NECK W WO CM  Addendum Date: 02/03/2022   ADDENDUM REPORT: 02/03/2022 05:50 ADDENDUM: #4 was Study discussed by telephone with ICU Nurse Brittney at 0528 hours on 02/03/2022. Electronically Signed   By: Genevie Ann M.D.   On: 02/03/2022 05:50   Result Date: 02/03/2022 CLINICAL DATA:  66 year old male status post fall, seizure. Intubated. Unrevealing brain MRI. EXAM: CT ANGIOGRAPHY HEAD AND NECK TECHNIQUE: Multidetector CT imaging of the head and neck was performed using the standard protocol during bolus administration of intravenous contrast. Multiplanar CT image reconstructions and MIPs were obtained to evaluate the vascular anatomy. Carotid stenosis measurements (when applicable) are obtained utilizing NASCET criteria, using the distal internal carotid diameter as the denominator. RADIATION DOSE REDUCTION: This exam  was performed according to the departmental dose-optimization program which includes automated exposure control, adjustment of the mA and/or kV according to patient size and/or use of iterative reconstruction technique. CONTRAST:  36mL OMNIPAQUE IOHEXOL 350 MG/ML SOLN COMPARISON:  02/02/2022 brain MRI, head and cervical spine CT, CT Chest, Abdomen, and Pelvis. FINDINGS: CT HEAD Brain: Mild EEG lead artifact now. Stable non contrast CT appearance of the brain. No acute or evolving infarct identified. No acute intracranial hemorrhage identified. No intracranial mass effect. Calvarium and skull base: Stable and intact. Paranasal sinuses: Remains intubated. Scattered paranasal sinus fluid and mucosal thickening is stable. Tympanic cavities and mastoids remain clear. Orbits: No acute orbit or scalp soft tissue finding. EEG leads are in place. CTA NECK Skeleton: Carious dentition. Spine degeneration. Previous sternotomy. No acute osseous abnormality identified. Upper chest: Prior CABG. Endotracheal tube terminates at the carina. Enteric tube courses into the esophagus. Visible mediastinum appears stable from a chest CTA 07/13/2020, with occasional maximal nonspecific mediastinal lymph nodes (right paratracheal series 8, image 164). Upper lungs remain clear. Other neck: Intubated and oral enteric tube in place with fluid in the pharynx. No superimposed acute neck soft tissue finding identified. Aortic arch: Prior CABG. Calcified aortic atherosclerosis. 3 vessel arch configuration. Right carotid system: Brachiocephalic soft and calcified plaque without stenosis. Negative right CCA origin. No right CCA plaque before the bifurcation. Soft more than calcified plaque at the right ICA origin with less than 50 % stenosis with respect to the distal vessel. Left carotid system: Mild left CCA origin plaque without stenosis. Calcified plaque at the left carotid bifurcation and ICA origin with less than 50 % stenosis with respect to  the distal vessel. Vertebral arteries: Proximal right subclavian artery soft and calcified plaque without stenosis. Normal right vertebral artery origin. Right vertebral artery appears codominant and normal to the skull base. Proximal left subclavian artery soft and calcified plaque with no significant stenosis. Plaque abuts the left vertebral artery origin with only mild origin stenosis on series  14, image 125. Superimposed left V1 calcified plaque on series 13, image 298 with only mild stenosis. Otherwise codominant and normal left vertebral artery to the skull base. CTA HEAD Posterior circulation: Mild left vertebral V4 calcified plaque without stenosis. Left PICA origin remains patent. Bulky right V4 calcified plaque and moderate to severe right V4 stenosis (series 13, image 155 and series 14, image 114) but the distal right vertebral remains patent. Patent vertebrobasilar junction without stenosis. Right AICA appears dominant and patent. Patent mildly tortuous basilar artery without stenosis. Normal SCA and PCA origins. Small posterior communicating arteries. Bilateral PCA branches are within normal limits. Anterior circulation: Both ICA siphons are patent. Tortuous left siphon cavernous segment with up to moderate calcified plaque and mild stenosis. Similar right siphon cavernous segment tortuosity and moderate calcified plaque but only mild right siphon stenosis. Patent carotid termini. Patent MCA and ACA origins. Dominant left A1, the right A1 is diminutive. Anterior communicating artery and bilateral ACA branches appear patent without stenosis. Ectatic bilateral ACA/pericallosal vessel branching (series 18, image 20), but no discrete ACA aneurysm. Left MCA M1 segment and bifurcation are patent without stenosis. Right MCA M1 segment and bifurcation are patent without stenosis. Bilateral MCA branches are within normal limits. Venous sinuses: Patent. Anatomic variants: Dominant left and diminutive right ACA A1  segments. Review of the MIP images confirms the above findings IMPRESSION: 1. Negative for large vessel occlusion. Positive for bulky calcified plaque at the Right Vertebral Artery V4 segment with moderate to severe stenosis. But the vessel remains patent. 2. No other significant arterial stenosis in the head or neck. Bilateral carotid bifurcation, bilateral cavernous ICA, and left vertebral artery origin plaque with only mild stenosis. 3. Stable CT appearance of the brain. No acute intracranial abnormality identified. 4. Endotracheal tube terminates at the carina, retract 1 cm for more optimal placement. Enteric tube courses into the esophagus. 5. Prior CABG. Aortic Atherosclerosis (ICD10-I70.0). Carious dentition. Electronically Signed: By: Genevie Ann M.D. On: 02/03/2022 05:14   MR BRAIN W WO CONTRAST  Result Date: 02/02/2022 CLINICAL DATA:  Seizure, new-onset, history of trauma EXAM: MRI HEAD WITHOUT AND WITH CONTRAST TECHNIQUE: Multiplanar, multiecho pulse sequences of the brain and surrounding structures were obtained without and with intravenous contrast. CONTRAST:  68mL GADAVIST GADOBUTROL 1 MMOL/ML IV SOLN COMPARISON:  CT head from the same day.  MRI head January 28, 2021. FINDINGS: Brain: No acute infarction, hemorrhage, hydrocephalus, extra-axial collection or mass lesion. Hippocampi appear grossly within normal limits and symmetric, although evaluation is limited due to motion on the coronal sequences. No pathologic enhancement. Vascular: Major arterial flow voids are maintained at the skull base. Skull and upper cervical spine: Normal marrow signal. Sinuses/Orbits: Paranasal sinus mucosal thickening with air-fluid levels. No acute orbital findings. Other: No mastoid effusions. IMPRESSION: No evidence of acute intracranial abnormality. Electronically Signed   By: Margaretha Sheffield M.D.   On: 02/02/2022 15:21   EEG adult  Result Date: 02/02/2022 Lora Havens, MD     02/02/2022  1:46 PM Patient  Name: Taevion Holtorf MRN: [id number] Epilepsy Attending: Lora Havens Referring Physician/Provider: Gleason, Otilio Carpen, PA-C Date: 02/02/2022 Duration: 23.36 mins Patient history: 66 year old male with altered mental status.  EEG to evaluate for seizure. Level of alertness: comatose AEDs during EEG study: GBP, Propofol Technical aspects: This EEG study was done with scalp electrodes positioned according to the 10-20 International system of electrode placement. Electrical activity was reviewed with band pass filter of 1-70Hz , sensitivity of 7 uV/mm, display  speed of 29mm/sec with a 60Hz  notched filter applied as appropriate. EEG data were recorded continuously and digitally stored.  Video monitoring was available and reviewed as appropriate. Description: EEG showed continuous generalized 3 to 6 Hz theta-delta slowing admixed with 15 to 18 Hz beta activity distributed symmetrically and diffusely. Hyperventilation and photic stimulation were not performed.   ABNORMALITY - Continuous slow, generalized IMPRESSION: This study is suggestive of severe diffuse encephalopathy, nonspecific etiology. No seizures or epileptiform discharges were seen throughout the recording. Lora Havens   DG Abd Portable 1V  Result Date: 02/02/2022 CLINICAL DATA:  Orogastric tube placement. EXAM: PORTABLE ABDOMEN - 1 VIEW COMPARISON:  None Available. FINDINGS: Distal tip of nasogastric tube is seen in expected position of distal stomach. IMPRESSION: Distal tip of nasogastric tube is seen in expected position of distal stomach. Electronically Signed   By: Marijo Conception M.D.   On: 02/02/2022 09:36   CT HEAD WO CONTRAST  Result Date: 02/02/2022 CLINICAL DATA:  Fall injury.  Level 1 trauma. EXAM: CT HEAD WITHOUT CONTRAST CT CERVICAL SPINE WITHOUT CONTRAST CT CHEST, ABDOMEN AND PELVIS WITH CONTRAST TECHNIQUE: Contiguous axial images were obtained from the base of the skull through the vertex without intravenous contrast.  Multidetector CT imaging of the cervical spine was performed without intravenous contrast. Multiplanar CT image reconstructions were also generated. Multidetector CT imaging of the chest, abdomen and pelvis was performed following the standard protocol during bolus administration of intravenous contrast. RADIATION DOSE REDUCTION: This exam was performed according to the departmental dose-optimization program which includes automated exposure control, adjustment of the mA and/or kV according to patient size and/or use of iterative reconstruction technique. CONTRAST:  73mL OMNIPAQUE IOHEXOL 350 MG/ML SOLN COMPARISON:  Head CT and cervical spine CT both 07/07/2021, CTA of abdomen and pelvis recently 01/22/2022, and CTA chest 07/13/2020. FINDINGS: CT HEAD FINDINGS Brain: There is mild global atrophy, mild small-vessel disease of the cerebral white matter. The ventricles are normal in size and position. No new asymmetry is seen worrisome for an acute hemorrhage, cortical based acute infarct, or mass. Vascular: There calcifications in the distal vertebral arteries and siphons but no hyperdense central vessel. Skull: Negative for fractures or focal lesions. I see no focal scalp hematoma. Sinuses/Orbits: Prominent bilateral proptosis with proliferated intraorbital fat, but without extraocular muscle thickening. This was seen previously. There is partial opacification of the ethmoid air cells. Trace membrane thickening in the maxillary sinuses. Other visualized sinuses, bilateral mastoid air cells and middle ears are clear. Other: None. CT CERVICAL FINDINGS Alignment: Normal. Skull base and vertebrae: The vertebra are normally mineralized. No fracture is evident. Joint space loss and spurring is again noted of the anterior atlantodental joint with the C1 lateral masses normally positioned on C2. Soft tissues and spinal canal: No prevertebral fluid or swelling. No visible canal hematoma. There calcifications in the  left-greater-than-right proximal cervical ICAs. Disc levels: There is mild disc space loss at C4-5 and moderate to advanced disc space loss C5-6 and C6-7, mild-to-moderate disc space loss C7-T1, unchanged. There are anterior osteophytes without significant posterior osteophytes at C2-3, C3-4 and C4-5. At C5-6 and C6-7 there are bidirectional osteophytes posterior disc osteophyte complexes encroaching on the ventral cord surface to left-greater-than-right. Other levels do not show significant soft tissue or bony encroachment on the thecal sac. Facet joint and uncinate hypertrophy is seen at most levels. Acquired foraminal stenosis is mild on the right at C2-3, moderate on the left at C3-4, bilaterally moderate to severe  C5-6, and moderate to severe on the left at C6-7. Other:  None. CT CHEST FINDINGS Cardiovascular: There is mild cardiomegaly, CABG change and three-vessel calcific CAD. There is no pericardial effusion. The pulmonary arteries and veins are normal caliber. The pulmonary arteries are centrally clear. The aorta is normal caliber without stenosis, aneurysm or dissection with mild patchy calcific plaques, and moderate calcification in the aortic valve leaflets. There are scattered calcifications in the great vessels without stenosis. Mediastinum/Nodes: No enlarged mediastinal, hilar, or axillary lymph nodes. Thyroid gland and thoracic esophagus demonstrate no significant findings. There are retained secretions in the distal trachea and ETT in place with tip 2.5 cm from the carina. The main bronchi are clear. Lungs/Pleura: Again noted scattered linear scarring or atelectasis in the lung bases, predominantly in the lower lobes. No infiltrate, contusion or nodule are seen. No pleural effusion, thickening or pneumothorax. Musculoskeletal: There are degenerative changes of the spine as well as multilevel bridging enthesopathy. There is no spinal compression injury. No acute regional skeletal abnormality is  visible. The ribcage is intact. CT ABDOMEN PELVIS FINDINGS Hepatobiliary: No hepatic injury or perihepatic hematoma. Gallbladder is unremarkable the liver is 20 cm length with mild general steatosis without mass. There is no biliary dilatation. Pancreas: Partially atrophic.  Otherwise unremarkable. Spleen: No splenic injury or perisplenic hematoma. Adrenals/Urinary Tract: There is no adrenal mass or hemorrhage. There is no renal mass enhancement or hemorrhage. Bilateral nonobstructive nephrolithiasis. Largest stone is 1 cm on the right. Perinephric stranding is unchanged. There is no hydronephrosis, ureteral stone or bladder thickening. Stomach/Bowel: The stomach and unopacified small bowel are unremarkable. An appendix is not seen. There is mild wall thickening versus underdistention in the ascending colon. There is mild fecal stasis and colonic interposition of the hepatic flexure in front of the liver. Scattered sigmoid diverticula without evidence of diverticulitis. Vascular/Lymphatic: Aortic atherosclerosis. No enlarged abdominal or pelvic lymph nodes. Stable asymmetry of ectasia in the left common iliac artery which measures 1.7 cm. Reproductive: No prostatomegaly. Other: Right gluteal subcutaneous hematoma measuring 9.3 x 3.8 cm on 4:98, at a similar level previously having measured 9.1 x 3.6 cm therefore only slightly larger. No active hemorrhaging vessel is seen within the collection. Stranding in the bilateral lateral upper thighs is again also noted greater on the right. There is no free fluid, free air, free hemorrhage or incarcerated hernia. Musculoskeletal: There is a mild superior endplate anterior wedge compression fracture of the L1 vertebral body, but this was seen previously. There is mild osteopenia and advanced degenerative change of the lumbar spine. No acute regional osseous abnormality is seen. Ankylosis across the anterior right SI joint. Multilevel lumbar foraminal stenosis. IMPRESSION: 1.  No acute intracranial CT findings or depressed skull fractures. Atrophy and small-vessel disease. 2. Bilateral proptosis with proliferated intraorbital fat, but without extraocular muscle thickening. This was seen previously and could be due to obesity, chronic corticosteroid use, thyroid ophthalmopathy or Graves' disease. 3. Cervical degenerative changes without evidence of fractures or listhesis. 4. 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 Aortic and coronary artery atherosclerosis. No findings of acute CHF. 5. Intubated, with retained secretions in the distal trachea. 6. No acute trauma related findings in the chest, abdomen or pelvis. 7. Right gluteal subcutaneous hematoma measuring 9.3 x 3.8 cm, only slightly larger than on 06/22/2021. No active hemorrhaging vessel is seen within the hematoma. 8. Ascending colitis versus nondistention. Constipation and diverticulosis. 9. Nonobstructive nephrolithiasis. 10. Osteopenia and degenerative change without acute regional skeletal abnormality. 11.  Results were called to Dr. Chelsea Primus at 4:39 a.m., 02/02/2022 immediately after review of available images. Aortic Atherosclerosis (ICD10-I70.0). Electronically Signed   By: Telford Nab M.D.   On: 02/02/2022 05:37   CT CERVICAL SPINE WO CONTRAST  Result Date: 02/02/2022 CLINICAL DATA:  Fall injury.  Level 1 trauma. EXAM: CT HEAD WITHOUT CONTRAST CT CERVICAL SPINE WITHOUT CONTRAST CT CHEST, ABDOMEN AND PELVIS WITH CONTRAST TECHNIQUE: Contiguous axial images were obtained from the base of the skull through the vertex without intravenous contrast. Multidetector CT imaging of the cervical spine was performed without intravenous contrast. Multiplanar CT image reconstructions were also generated. Multidetector CT imaging of the chest, abdomen and pelvis was performed following the standard protocol during bolus administration of intravenous contrast. RADIATION DOSE REDUCTION: This exam was performed according to  the departmental dose-optimization program which includes automated exposure control, adjustment of the mA and/or kV according to patient size and/or use of iterative reconstruction technique. CONTRAST:  56mL OMNIPAQUE IOHEXOL 350 MG/ML SOLN COMPARISON:  Head CT and cervical spine CT both 07/07/2021, CTA of abdomen and pelvis recently 01/22/2022, and CTA chest 07/13/2020. FINDINGS: CT HEAD FINDINGS Brain: There is mild global atrophy, mild small-vessel disease of the cerebral white matter. The ventricles are normal in size and position. No new asymmetry is seen worrisome for an acute hemorrhage, cortical based acute infarct, or mass. Vascular: There calcifications in the distal vertebral arteries and siphons but no hyperdense central vessel. Skull: Negative for fractures or focal lesions. I see no focal scalp hematoma. Sinuses/Orbits: Prominent bilateral proptosis with proliferated intraorbital fat, but without extraocular muscle thickening. This was seen previously. There is partial opacification of the ethmoid air cells. Trace membrane thickening in the maxillary sinuses. Other visualized sinuses, bilateral mastoid air cells and middle ears are clear. Other: None. CT CERVICAL FINDINGS Alignment: Normal. Skull base and vertebrae: The vertebra are normally mineralized. No fracture is evident. Joint space loss and spurring is again noted of the anterior atlantodental joint with the C1 lateral masses normally positioned on C2. Soft tissues and spinal canal: No prevertebral fluid or swelling. No visible canal hematoma. There calcifications in the left-greater-than-right proximal cervical ICAs. Disc levels: There is mild disc space loss at C4-5 and moderate to advanced disc space loss C5-6 and C6-7, mild-to-moderate disc space loss C7-T1, unchanged. There are anterior osteophytes without significant posterior osteophytes at C2-3, C3-4 and C4-5. At C5-6 and C6-7 there are bidirectional osteophytes posterior disc  osteophyte complexes encroaching on the ventral cord surface to left-greater-than-right. Other levels do not show significant soft tissue or bony encroachment on the thecal sac. Facet joint and uncinate hypertrophy is seen at most levels. Acquired foraminal stenosis is mild on the right at C2-3, moderate on the left at C3-4, bilaterally moderate to severe C5-6, and moderate to severe on the left at C6-7. Other:  None. CT CHEST FINDINGS Cardiovascular: There is mild cardiomegaly, CABG change and three-vessel calcific CAD. There is no pericardial effusion. The pulmonary arteries and veins are normal caliber. The pulmonary arteries are centrally clear. The aorta is normal caliber without stenosis, aneurysm or dissection with mild patchy calcific plaques, and moderate calcification in the aortic valve leaflets. There are scattered calcifications in the great vessels without stenosis. Mediastinum/Nodes: No enlarged mediastinal, hilar, or axillary lymph nodes. Thyroid gland and thoracic esophagus demonstrate no significant findings. There are retained secretions in the distal trachea and ETT in place with tip 2.5 cm from the carina. The main bronchi are clear. Lungs/Pleura: Again noted  scattered linear scarring or atelectasis in the lung bases, predominantly in the lower lobes. No infiltrate, contusion or nodule are seen. No pleural effusion, thickening or pneumothorax. Musculoskeletal: There are degenerative changes of the spine as well as multilevel bridging enthesopathy. There is no spinal compression injury. No acute regional skeletal abnormality is visible. The ribcage is intact. CT ABDOMEN PELVIS FINDINGS Hepatobiliary: No hepatic injury or perihepatic hematoma. Gallbladder is unremarkable the liver is 20 cm length with mild general steatosis without mass. There is no biliary dilatation. Pancreas: Partially atrophic.  Otherwise unremarkable. Spleen: No splenic injury or perisplenic hematoma. Adrenals/Urinary Tract:  There is no adrenal mass or hemorrhage. There is no renal mass enhancement or hemorrhage. Bilateral nonobstructive nephrolithiasis. Largest stone is 1 cm on the right. Perinephric stranding is unchanged. There is no hydronephrosis, ureteral stone or bladder thickening. Stomach/Bowel: The stomach and unopacified small bowel are unremarkable. An appendix is not seen. There is mild wall thickening versus underdistention in the ascending colon. There is mild fecal stasis and colonic interposition of the hepatic flexure in front of the liver. Scattered sigmoid diverticula without evidence of diverticulitis. Vascular/Lymphatic: Aortic atherosclerosis. No enlarged abdominal or pelvic lymph nodes. Stable asymmetry of ectasia in the left common iliac artery which measures 1.7 cm. Reproductive: No prostatomegaly. Other: Right gluteal subcutaneous hematoma measuring 9.3 x 3.8 cm on 4:98, at a similar level previously having measured 9.1 x 3.6 cm therefore only slightly larger. No active hemorrhaging vessel is seen within the collection. Stranding in the bilateral lateral upper thighs is again also noted greater on the right. There is no free fluid, free air, free hemorrhage or incarcerated hernia. Musculoskeletal: There is a mild superior endplate anterior wedge compression fracture of the L1 vertebral body, but this was seen previously. There is mild osteopenia and advanced degenerative change of the lumbar spine. No acute regional osseous abnormality is seen. Ankylosis across the anterior right SI joint. Multilevel lumbar foraminal stenosis. IMPRESSION: 1. No acute intracranial CT findings or depressed skull fractures. Atrophy and small-vessel disease. 2. Bilateral proptosis with proliferated intraorbital fat, but without extraocular muscle thickening. This was seen previously and could be due to obesity, chronic corticosteroid use, thyroid ophthalmopathy or Graves' disease. 3. Cervical degenerative changes without evidence  of fractures or listhesis. 4. 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 Aortic and coronary artery atherosclerosis. No findings of acute CHF. 5. Intubated, with retained secretions in the distal trachea. 6. No acute trauma related findings in the chest, abdomen or pelvis. 7. Right gluteal subcutaneous hematoma measuring 9.3 x 3.8 cm, only slightly larger than on 06/22/2021. No active hemorrhaging vessel is seen within the hematoma. 8. Ascending colitis versus nondistention. Constipation and diverticulosis. 9. Nonobstructive nephrolithiasis. 10. Osteopenia and degenerative change without acute regional skeletal abnormality. 11. Results were called to Dr. Chelsea Primus at 4:39 a.m., 02/02/2022 immediately after review of available images. Aortic Atherosclerosis (ICD10-I70.0). Electronically Signed   By: Telford Nab M.D.   On: 02/02/2022 05:37   CT CHEST ABDOMEN PELVIS W CONTRAST  Result Date: 02/02/2022 CLINICAL DATA:  Fall injury.  Level 1 trauma. EXAM: CT HEAD WITHOUT CONTRAST CT CERVICAL SPINE WITHOUT CONTRAST CT CHEST, ABDOMEN AND PELVIS WITH CONTRAST TECHNIQUE: Contiguous axial images were obtained from the base of the skull through the vertex without intravenous contrast. Multidetector CT imaging of the cervical spine was performed without intravenous contrast. Multiplanar CT image reconstructions were also generated. Multidetector CT imaging of the chest, abdomen and pelvis was performed following the standard protocol during  bolus administration of intravenous contrast. RADIATION DOSE REDUCTION: This exam was performed according to the departmental dose-optimization program which includes automated exposure control, adjustment of the mA and/or kV according to patient size and/or use of iterative reconstruction technique. CONTRAST:  36mL OMNIPAQUE IOHEXOL 350 MG/ML SOLN COMPARISON:  Head CT and cervical spine CT both 07/07/2021, CTA of abdomen and pelvis recently 01/22/2022, and CTA chest  07/13/2020. FINDINGS: CT HEAD FINDINGS Brain: There is mild global atrophy, mild small-vessel disease of the cerebral white matter. The ventricles are normal in size and position. No new asymmetry is seen worrisome for an acute hemorrhage, cortical based acute infarct, or mass. Vascular: There calcifications in the distal vertebral arteries and siphons but no hyperdense central vessel. Skull: Negative for fractures or focal lesions. I see no focal scalp hematoma. Sinuses/Orbits: Prominent bilateral proptosis with proliferated intraorbital fat, but without extraocular muscle thickening. This was seen previously. There is partial opacification of the ethmoid air cells. Trace membrane thickening in the maxillary sinuses. Other visualized sinuses, bilateral mastoid air cells and middle ears are clear. Other: None. CT CERVICAL FINDINGS Alignment: Normal. Skull base and vertebrae: The vertebra are normally mineralized. No fracture is evident. Joint space loss and spurring is again noted of the anterior atlantodental joint with the C1 lateral masses normally positioned on C2. Soft tissues and spinal canal: No prevertebral fluid or swelling. No visible canal hematoma. There calcifications in the left-greater-than-right proximal cervical ICAs. Disc levels: There is mild disc space loss at C4-5 and moderate to advanced disc space loss C5-6 and C6-7, mild-to-moderate disc space loss C7-T1, unchanged. There are anterior osteophytes without significant posterior osteophytes at C2-3, C3-4 and C4-5. At C5-6 and C6-7 there are bidirectional osteophytes posterior disc osteophyte complexes encroaching on the ventral cord surface to left-greater-than-right. Other levels do not show significant soft tissue or bony encroachment on the thecal sac. Facet joint and uncinate hypertrophy is seen at most levels. Acquired foraminal stenosis is mild on the right at C2-3, moderate on the left at C3-4, bilaterally moderate to severe C5-6, and  moderate to severe on the left at C6-7. Other:  None. CT CHEST FINDINGS Cardiovascular: There is mild cardiomegaly, CABG change and three-vessel calcific CAD. There is no pericardial effusion. The pulmonary arteries and veins are normal caliber. The pulmonary arteries are centrally clear. The aorta is normal caliber without stenosis, aneurysm or dissection with mild patchy calcific plaques, and moderate calcification in the aortic valve leaflets. There are scattered calcifications in the great vessels without stenosis. Mediastinum/Nodes: No enlarged mediastinal, hilar, or axillary lymph nodes. Thyroid gland and thoracic esophagus demonstrate no significant findings. There are retained secretions in the distal trachea and ETT in place with tip 2.5 cm from the carina. The main bronchi are clear. Lungs/Pleura: Again noted scattered linear scarring or atelectasis in the lung bases, predominantly in the lower lobes. No infiltrate, contusion or nodule are seen. No pleural effusion, thickening or pneumothorax. Musculoskeletal: There are degenerative changes of the spine as well as multilevel bridging enthesopathy. There is no spinal compression injury. No acute regional skeletal abnormality is visible. The ribcage is intact. CT ABDOMEN PELVIS FINDINGS Hepatobiliary: No hepatic injury or perihepatic hematoma. Gallbladder is unremarkable the liver is 20 cm length with mild general steatosis without mass. There is no biliary dilatation. Pancreas: Partially atrophic.  Otherwise unremarkable. Spleen: No splenic injury or perisplenic hematoma. Adrenals/Urinary Tract: There is no adrenal mass or hemorrhage. There is no renal mass enhancement or hemorrhage. Bilateral nonobstructive nephrolithiasis. Largest  stone is 1 cm on the right. Perinephric stranding is unchanged. There is no hydronephrosis, ureteral stone or bladder thickening. Stomach/Bowel: The stomach and unopacified small bowel are unremarkable. An appendix is not seen.  There is mild wall thickening versus underdistention in the ascending colon. There is mild fecal stasis and colonic interposition of the hepatic flexure in front of the liver. Scattered sigmoid diverticula without evidence of diverticulitis. Vascular/Lymphatic: Aortic atherosclerosis. No enlarged abdominal or pelvic lymph nodes. Stable asymmetry of ectasia in the left common iliac artery which measures 1.7 cm. Reproductive: No prostatomegaly. Other: Right gluteal subcutaneous hematoma measuring 9.3 x 3.8 cm on 4:98, at a similar level previously having measured 9.1 x 3.6 cm therefore only slightly larger. No active hemorrhaging vessel is seen within the collection. Stranding in the bilateral lateral upper thighs is again also noted greater on the right. There is no free fluid, free air, free hemorrhage or incarcerated hernia. Musculoskeletal: There is a mild superior endplate anterior wedge compression fracture of the L1 vertebral body, but this was seen previously. There is mild osteopenia and advanced degenerative change of the lumbar spine. No acute regional osseous abnormality is seen. Ankylosis across the anterior right SI joint. Multilevel lumbar foraminal stenosis. IMPRESSION: 1. No acute intracranial CT findings or depressed skull fractures. Atrophy and small-vessel disease. 2. Bilateral proptosis with proliferated intraorbital fat, but without extraocular muscle thickening. This was seen previously and could be due to obesity, chronic corticosteroid use, thyroid ophthalmopathy or Graves' disease. 3. Cervical degenerative changes without evidence of fractures or listhesis. 4. 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 Aortic and coronary artery atherosclerosis. No findings of acute CHF. 5. Intubated, with retained secretions in the distal trachea. 6. No acute trauma related findings in the chest, abdomen or pelvis. 7. Right gluteal subcutaneous hematoma measuring 9.3 x 3.8 cm, only slightly larger than  on 06/22/2021. No active hemorrhaging vessel is seen within the hematoma. 8. Ascending colitis versus nondistention. Constipation and diverticulosis. 9. Nonobstructive nephrolithiasis. 10. Osteopenia and degenerative change without acute regional skeletal abnormality. 11. Results were called to Dr. Chelsea Primus at 4:39 a.m., 02/02/2022 immediately after review of available images. Aortic Atherosclerosis (ICD10-I70.0). Electronically Signed   By: Telford Nab M.D.   On: 02/02/2022 05:37   DG Pelvis Portable  Result Date: 02/02/2022 CLINICAL DATA:  Trauma. EXAM: PORTABLE PELVIS 1-2 VIEWS COMPARISON:  01/22/2022 FINDINGS: There is no evidence of pelvic fracture or diastasis. No pelvic bone lesions are seen. Degenerative changes noted within both hips and lumbar spine. IMPRESSION: 1. No acute findings. 2. Degenerative joint disease. Electronically Signed   By: Kerby Moors M.D.   On: 02/02/2022 05:04   DG Chest Port 1 View  Result Date: 02/02/2022 CLINICAL DATA:  Fall injury. Level 1 trauma. Ventilator dependent respiratory failure. EXAM: PORTABLE CHEST 1 VIEW COMPARISON:  Portable chest 01/22/2023 a. FINDINGS: The lungs are clear. No pneumothorax or pleural effusion are seen. There is mild cardiomegaly with old CABG. No vascular congestion is evident. ETT is in place with tip 3 cm from carina. Multiple overlying monitor wires. No acute osseous abnormality is visible. IMPRESSION: No acute radiographic chest findings. Mild cardiomegaly with old CABG. ETT in place. Electronically Signed   By: Telford Nab M.D.   On: 02/02/2022 04:55   [place] LT UPPER EXTREM LTD SOFT TISSUE NON VASCULAR  Result Date: 01/23/2022 CLINICAL DATA:  Left arm bruise/hematoma in the antecubital region. EXAM: ULTRASOUND LEFT UPPER EXTREMITY LIMITED TECHNIQUE: Ultrasound examination of the upper extremity soft  tissues was performed in the area of clinical concern. COMPARISON:  None Available. FINDINGS: No cystic lesion or soft  tissue mass identified. There is likely some infiltrative edema in the left antecubital fossa for example on image 7 of series 1 -1, but no drainable collection. IMPRESSION: 1. No cystic lesion or soft tissue mass identified in the left antecubital fossa. There is likely some edema in the left antecubital fossa, but no drainable collection. Electronically Signed   By: Van Clines M.D.   On: 01/23/2022 17:45   CT Angio Abd/Pel W and/or Wo Contrast  Result Date: 01/22/2022 CLINICAL DATA:  Hematoma in right buttock on Coumadin EXAM: CTA ABDOMEN AND PELVIS WITHOUT AND WITH CONTRAST TECHNIQUE: Multidetector CT imaging of the abdomen and pelvis was performed using the standard protocol during bolus administration of intravenous contrast. Multiplanar reconstructed images and MIPs were obtained and reviewed to evaluate the vascular anatomy. RADIATION DOSE REDUCTION: This exam was performed according to the departmental dose-optimization program which includes automated exposure control, adjustment of the mA and/or kV according to patient size and/or use of iterative reconstruction technique. CONTRAST:  193mL OMNIPAQUE IOHEXOL 350 MG/ML SOLN COMPARISON:  CT abdomen and pelvis 01/20/2020 FINDINGS: VASCULAR Aorta: Calcified athero sclerotic plaque without significant narrowing. No aneurysm or dissection. Celiac: Atherosclerotic plaque at the origin causes moderate narrowing. No aneurysm or dissection. Normal variant origin of the left gastric directly from the aorta. SMA: Patent without evidence of aneurysm, dissection, vasculitis or significant stenosis. Renals: Both renal arteries are patent without evidence of aneurysm, dissection, vasculitis, fibromuscular dysplasia or significant stenosis. IMA: Patent without evidence of aneurysm, dissection, vasculitis or significant stenosis. 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 Calcified athero sclerotic plaque in both common, internal, and  external iliac arteries. No dissection. Proximal Outflow: Bilateral common femoral and visualized portions of the superficial and profunda femoral arteries are patent without evidence of aneurysm, dissection, vasculitis or significant stenosis. Veins: Unremarkable. Review of the MIP images confirms the above findings. NON-VASCULAR Lower chest: Bibasilar scarring/atelectasis.  No acute abnormality. Hepatobiliary: Hepatic steatosis. No focal liver abnormality is seen. No gallstones, gallbladder wall thickening, or biliary dilatation. Pancreas: Unremarkable. No pancreatic ductal dilatation or surrounding inflammatory changes. Spleen: Normal in size without focal abnormality. Adrenals/Urinary Tract: Unremarkable adrenal glands. Bilateral nephrolithiasis measuring up to 1.0 cm right. No hydronephrosis or obstructing urinary calculi. Unremarkable bladder. Stomach/Bowel: Normal caliber large and small bowel. Moderate colonic stool burden. Unremarkable stomach. Lymphatic: Unchanged bilateral external iliac lymphadenopathy measuring up to 1.4 cm on the right. Reproductive: Unremarkable. Other: No free intraperitoneal fluid or air. Musculoskeletal: Edema or contusion about the bilateral posterior gluteal subcutaneous fat. Within the right gluteal subcutaneous fat there is a hyperdense fluid collection measuring approximately 9.3 by 3.7 cm. No evidence of active contrast extravasation. This does not change in density between phases. Thoracolumbar spondylosis. No acute osseous abnormality. IMPRESSION: VASCULAR Hematoma in the right gluteal subcutaneous fat. No evidence of active bleeding. No acute aortic syndrome.  Aortic Atherosclerosis (ICD10-I70.0). 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 NON-VASCULAR No acute abnormality in the abdomen or pelvis. Bilateral nonobstructing nephrolithiasis measuring up to 1.0 cm on the right. Hepatic steatosis. Electronically Signed   By: Placido Sou M.D.   On: 01/22/2022  03:51   DG Chest 1 View  Result Date: 01/21/2022 CLINICAL DATA:  Loss of consciousness and low oxygen saturation; history of COPD EXAM: CHEST  1 VIEW COMPARISON:  Radiograph 11/08/2018 FINDINGS: Stable cardiomegaly.  Sternotomy  and CABG. Increased interstitial coarsening compared with 11/07/2021. Bibasilar atelectasis. Question trace bilateral pleural effusions. No pneumothorax. No acute osseous abnormality. IMPRESSION: Cardiomegaly with possible mild interstitial edema. Question trace bilateral pleural effusions. Electronically Signed   By: Placido Sou M.D.   On: 01/21/2022 20:38    Microbiology: Results for orders placed or performed during the hospital encounter of 02/12/22  Respiratory (~20 pathogens) panel by PCR     Status: Abnormal   Collection Time: 02/15/22 11:14 AM   Specimen: Nasopharyngeal Swab; Respiratory  Result Value Ref Range Status   Adenovirus NOT DETECTED NOT DETECTED Final   Coronavirus 229E NOT DETECTED NOT DETECTED Final    Comment: (NOTE) The Coronavirus on the Respiratory Panel, DOES NOT test for the novel  Coronavirus (2019 nCoV)    Coronavirus HKU1 NOT DETECTED NOT DETECTED Final   Coronavirus NL63 NOT DETECTED NOT DETECTED Final   Coronavirus OC43 NOT DETECTED NOT DETECTED Final   Metapneumovirus NOT DETECTED NOT DETECTED Final   Rhinovirus / Enterovirus NOT DETECTED NOT DETECTED Final   Influenza A H1 2009 DETECTED (A) NOT DETECTED Final   Influenza B NOT DETECTED NOT DETECTED Final   Parainfluenza Virus 1 NOT DETECTED NOT DETECTED Final   Parainfluenza Virus 2 NOT DETECTED NOT DETECTED Final   Parainfluenza Virus 3 NOT DETECTED NOT DETECTED Final   Parainfluenza Virus 4 NOT DETECTED NOT DETECTED Final   Respiratory Syncytial Virus NOT DETECTED NOT DETECTED Final   Bordetella pertussis NOT DETECTED NOT DETECTED Final   Bordetella Parapertussis NOT DETECTED NOT DETECTED Final   Chlamydophila pneumoniae NOT DETECTED NOT DETECTED Final   Mycoplasma  pneumoniae NOT DETECTED NOT DETECTED Final    Comment: Performed at [facility], [place] [address] [place], [place] [address]  Culture, blood (Routine X 2) w Reflex to ID Panel     Status: None (Preliminary result)   Collection Time: 02/15/22 11:33 AM   Specimen: BLOOD  Result Value Ref Range Status   Specimen Description BLOOD LEFT ANTECUBITAL  Final   Special Requests   Final    BOTTLES DRAWN AEROBIC AND ANAEROBIC Blood Culture results may not be optimal due to an inadequate volume of blood received in culture bottles   Culture   Final    NO GROWTH 4 DAYS Performed at [facility], [place] [address], [place], [place] [address]    Report Status PENDING  Incomplete  Culture, blood (Routine X 2) w Reflex to ID Panel     Status: None (Preliminary result)   Collection Time: 02/15/22 11:33 AM   Specimen: BLOOD  Result Value Ref Range Status   Specimen Description BLOOD RIGHT ANTECUBITAL  Final   Special Requests   Final    BOTTLES DRAWN AEROBIC AND ANAEROBIC Blood Culture results may not be optimal due to an inadequate volume of blood received in culture bottles   Culture   Final    NO GROWTH 4 DAYS Performed at [facility], [place] [address], [place], [place] [address]    Report Status PENDING  Incomplete    Labs: CBC: Recent Labs  Lab 02/14/22 0110 02/15/22 0805 02/16/22 0136 02/17/22 0231 02/19/22 0209  WBC 9.0 14.1* 8.3 6.1 6.0  HGB 7.8* 8.9* 8.6* 8.6* 8.5*  HCT 26.8* 30.1* 29.2* 28.8* 26.9*  MCV 96.4 92.6 93.9 92.6 88.8  PLT 234 204 179 157 [id number]   Basic Metabolic Panel: Recent Labs  Lab 02/15/22 0805 02/16/22 0136 02/17/22 0231 02/18/22 0129 02/19/22 [id number]  NA 133* 140 140 140 139  K 4.1 3.9 3.9 3.7 3.6  CL 100 103 103 99 98  CO2 24 29 28 30 31   GLUCOSE 155* 136* 112* 136* 154*  BUN 13 10 17 23 23   CREATININE 0.84 0.70 0.84 0.83 1.02  CALCIUM 8.6* 8.7* 8.8* 9.0 9.2   Liver Function Tests: Recent Labs  Lab 02/15/22 1139 02/16/22 0136  02/17/22 0231 02/18/22 0129 02/19/22 0209  AST 23 30 33 31 28  ALT 22 26 28 30 31   ALKPHOS 61 69 61 60 66  BILITOT 0.2* 0.5 0.5 0.5 0.3  PROT 7.1 7.1 6.7 6.4* 7.1  ALBUMIN 3.1* 3.1* 2.9* 2.9* 3.2*   CBG: Recent Labs  Lab 02/18/22 1144 02/18/22 1608 02/18/22 2046 02/19/22 0558 02/19/22 1251  GLUCAP 142* 133* 156* 150* 110*    Discharge time spent: greater than 30 minutes.  Signed: Oswald Hillock, MD [facility] 02/19/2022

## 2022-02-19 NOTE — Progress Notes (Signed)
ANTICOAGULATION CONSULT NOTE Pharmacy Consult for warfarin Indication:   PE/Antiphospholipid Syndrome  Allergies  Allergen Reactions   Melatonin Anxiety   Altace [Ramipril] Cough    Patient Measurements: Height: 5\' 7"  (170.2 cm) Weight: 107 kg (235 lb 14.3 oz) IBW/kg (Calculated) : 66.1  Vital Signs: Temp: 98 F (36.7 C) (12/07 0417) Temp Source: Oral (12/07 0417) BP: 113/45 (12/07 0417) Pulse Rate: 69 (12/07 0417)  Labs: Recent Labs    02/17/22 0231 02/18/22 0129 02/19/22 0209  HGB 8.6*  --  8.5*  HCT 28.8*  --  26.9*  PLT 157  --  211  LABPROT  --  13.4 13.2  INR  --  1.0 1.0  CREATININE 0.84 0.83 1.02     Estimated Creatinine Clearance: 83.1 mL/min (by C-G formula based on SCr of 1.02 mg/dL).   Assessment: 66 yom on chronic warfarin for antiphospholipid syndrome with hx PE admitted with syncope and fall at home. Of note, he does have a hx of recurrent syncope with falls. CT noted small SAH and warfarin was held on admit. Latest CT head 12/4 was without progression of SAH and pharmacy consulted to resume warfarin.  INR not increasing PTA warfarin regimen: 5 mg daily  Goal of Therapy:  INR 2-3 (previous discussion about aiming on low end given recurrent falls)  Monitor platelets by anticoagulation protocol: Yes   Plan:  Warfarin 7.5 mg po x 1 Discontinue enoxaparin when INR >=2 INR daily Monitor for s/sx of bleeding  Thank you for involving pharmacy in this patient's care. 14/4, PharmD 02/19/2022 8:01 AM

## 2022-02-20 ENCOUNTER — Emergency Department (HOSPITAL_COMMUNITY)
Admission: EM | Admit: 2022-02-20 | Discharge: 2022-02-20 | Disposition: A | Discharge status: Home / Self Care | Payer: Medicare Other | Attending: Emergency Medicine | Admitting: Emergency Medicine

## 2022-02-20 ENCOUNTER — Emergency Department (HOSPITAL_COMMUNITY): Payer: Medicare Other

## 2022-02-20 ENCOUNTER — Encounter (HOSPITAL_COMMUNITY): Payer: Self-pay | Admitting: Emergency Medicine

## 2022-02-20 ENCOUNTER — Other Ambulatory Visit: Payer: Self-pay

## 2022-02-20 DIAGNOSIS — Z7901 Long term (current) use of anticoagulants: Secondary | ICD-10-CM | POA: Diagnosis not present

## 2022-02-20 DIAGNOSIS — Z7951 Long term (current) use of inhaled steroids: Secondary | ICD-10-CM | POA: Insufficient documentation

## 2022-02-20 DIAGNOSIS — R55 Syncope and collapse: Secondary | ICD-10-CM

## 2022-02-20 DIAGNOSIS — Z7984 Long term (current) use of oral hypoglycemic drugs: Secondary | ICD-10-CM | POA: Diagnosis not present

## 2022-02-20 DIAGNOSIS — S0031XA Abrasion of nose, initial encounter: Secondary | ICD-10-CM | POA: Diagnosis not present

## 2022-02-20 DIAGNOSIS — J449 Chronic obstructive pulmonary disease, unspecified: Secondary | ICD-10-CM | POA: Insufficient documentation

## 2022-02-20 DIAGNOSIS — R42 Dizziness and giddiness: Secondary | ICD-10-CM | POA: Diagnosis not present

## 2022-02-20 DIAGNOSIS — I10 Essential (primary) hypertension: Secondary | ICD-10-CM | POA: Insufficient documentation

## 2022-02-20 DIAGNOSIS — W0110XA Fall on same level from slipping, tripping and stumbling with subsequent striking against unspecified object, initial encounter: Secondary | ICD-10-CM | POA: Insufficient documentation

## 2022-02-20 DIAGNOSIS — R58 Hemorrhage, not elsewhere classified: Secondary | ICD-10-CM | POA: Diagnosis not present

## 2022-02-20 DIAGNOSIS — W19XXXA Unspecified fall, initial encounter: Secondary | ICD-10-CM | POA: Diagnosis not present

## 2022-02-20 DIAGNOSIS — S0083XA Contusion of other part of head, initial encounter: Secondary | ICD-10-CM | POA: Diagnosis not present

## 2022-02-20 DIAGNOSIS — S06340A Traumatic hemorrhage of right cerebrum without loss of consciousness, initial encounter: Secondary | ICD-10-CM | POA: Diagnosis not present

## 2022-02-20 DIAGNOSIS — I2510 Atherosclerotic heart disease of native coronary artery without angina pectoris: Secondary | ICD-10-CM | POA: Diagnosis not present

## 2022-02-20 DIAGNOSIS — M47813 Spondylosis without myelopathy or radiculopathy, cervicothoracic region: Secondary | ICD-10-CM | POA: Diagnosis not present

## 2022-02-20 DIAGNOSIS — E119 Type 2 diabetes mellitus without complications: Secondary | ICD-10-CM | POA: Insufficient documentation

## 2022-02-20 DIAGNOSIS — M47812 Spondylosis without myelopathy or radiculopathy, cervical region: Secondary | ICD-10-CM | POA: Diagnosis not present

## 2022-02-20 LAB — BASIC METABOLIC PANEL
Anion gap: 13 (ref 5–15)
BUN: 25 mg/dL — ABNORMAL HIGH (ref 8–23)
CO2: 27 mmol/L (ref 22–32)
Calcium: 9.2 mg/dL (ref 8.9–10.3)
Chloride: 98 mmol/L (ref 98–111)
Creatinine, Ser: 1.1 mg/dL (ref 0.61–1.24)
GFR, Estimated: >60 mL/min (ref >60)
Glucose, Bld: 151 mg/dL — ABNORMAL HIGH (ref 70–99)
Potassium: 3.5 mmol/L (ref 3.5–5.1)
Sodium: 138 mmol/L (ref 135–145)

## 2022-02-20 LAB — I-STAT CHEM 8, ED
BUN: 25 mg/dL — ABNORMAL HIGH (ref 8–23)
Calcium, Ion: 1.12 mmol/L — ABNORMAL LOW (ref 1.15–1.40)
Chloride: 99 mmol/L (ref 98–111)
Creatinine, Ser: 1.1 mg/dL (ref 0.61–1.24)
Glucose, Bld: 150 mg/dL — ABNORMAL HIGH (ref 70–99)
HCT: 29 % — ABNORMAL LOW (ref 39.0–52.0)
Hemoglobin: 9.9 g/dL — ABNORMAL LOW (ref 13.0–17.0)
Potassium: 3.5 mmol/L (ref 3.5–5.1)
Sodium: 138 mmol/L (ref 135–145)
TCO2: 27 mmol/L (ref 22–32)

## 2022-02-20 LAB — CBC WITH DIFFERENTIAL/PLATELET
Abs Immature Granulocytes: 0 10*3/uL (ref 0.00–0.07)
Basophils Absolute: 0 10*3/uL (ref 0.0–0.1)
Basophils Relative: 0 %
Eosinophils Absolute: 0.3 10*3/uL (ref 0.0–0.5)
Eosinophils Relative: 3 %
HCT: 30.1 % — ABNORMAL LOW (ref 39.0–52.0)
Hemoglobin: 9 g/dL — ABNORMAL LOW (ref 13.0–17.0)
Lymphocytes Relative: 34 %
Lymphs Abs: 3.2 10*3/uL (ref 0.7–4.0)
MCH: 27.7 pg (ref 26.0–34.0)
MCHC: 29.9 g/dL — ABNORMAL LOW (ref 30.0–36.0)
MCV: 92.6 fL (ref 80.0–100.0)
Monocytes Absolute: 0.7 10*3/uL (ref 0.1–1.0)
Monocytes Relative: 8 %
Neutro Abs: 5.1 10*3/uL (ref 1.7–7.7)
Neutrophils Relative %: 55 %
Platelets: 254 10*3/uL (ref 150–400)
RBC: 3.25 MIL/uL — ABNORMAL LOW (ref 4.22–5.81)
RDW: 16.1 % — ABNORMAL HIGH (ref 11.5–15.5)
WBC: 9.3 10*3/uL (ref 4.0–10.5)
nRBC: 0 % (ref 0.0–0.2)
nRBC: 0 /100 WBC

## 2022-02-20 LAB — CULTURE, BLOOD (ROUTINE X 2)
Culture: NO GROWTH
Culture: NO GROWTH

## 2022-02-20 LAB — PROTIME-INR
INR: 1.2 (ref 0.8–1.2)
Prothrombin Time: 15.1 seconds (ref 11.4–15.2)

## 2022-02-20 LAB — CBG MONITORING, ED: Glucose-Capillary: 141 mg/dL — ABNORMAL HIGH (ref 70–99)

## 2022-02-20 LAB — TROPONIN I (HIGH SENSITIVITY): Troponin I (High Sensitivity): 11 ng/L (ref <18)

## 2022-02-20 NOTE — ED Notes (Signed)
Trauma Event Note   TRN to bedside for level two activation, assumed care from [facility] at shift change. Assisted primary nurse in CT. Pending imaging results.  Last imported Vital Signs BP (!) 161/82   Pulse 79   Temp 99.1 F (37.3 C) (Oral)   Resp 19   SpO2 98%   Trending CBC Recent Labs    02/19/22 0209 02/20/22 1914  WBC 6.0  --   HGB 8.5* 9.9*  HCT 26.9* 29.0*  PLT 211  --     Trending Coag's Recent Labs    02/18/22 0129 02/19/22 0209  INR 1.0 1.0    Trending BMET Recent Labs    02/18/22 0129 02/19/22 0209 02/20/22 1914  NA 140 139 138  K 3.7 3.6 3.5  CL 99 98 99  CO2 30 31  --   BUN 23 23 25*  CREATININE 0.83 1.02 1.10  GLUCOSE 136* 154* 150*      Bora Broner O Johannes Everage  Trauma Response RN  Please call TRN at [phone] for further assistance.

## 2022-02-20 NOTE — Progress Notes (Signed)
Orthopedic Tech Progress Note Patient Details:  Jaliel Deavers 1955/06/05 [id number]  Patient ID: [name], male   DOB: 1955/06/15, 66 y.o.   MRN: [id number] Level II; not currently needed. Darleen Crocker 02/20/2022, 6:58 PM

## 2022-02-20 NOTE — ED Notes (Signed)
Patient transported to CT with RN 

## 2022-02-20 NOTE — ED Provider Notes (Signed)
[facility] EMERGENCY DEPARTMENT Provider Note   CSN: [id number] Arrival date & time: 02/20/22  1851     History  Chief Complaint  Patient presents with   Jose [name] is a 66 y.o. male.   Fall   Patient has a history of vertebral artery insufficiency, antiphospholipid antibodies syndrome, hypertension, diabetes, obstructive sleep apnea, coronary artery disease, hypertension, COPD and recurrent episodes of syncope.  Patient is on chronic oxygen at home.  He is on chronic warfarin anticoagulation.  Patient states he may have had 50-60 syncopal episodes.  He states his doctors do not know why this occurs.  Patient was recently admitted to the hospital on November 30 and was just discharged yesterday.  Patient was found to have a traumatic subarachnoid hemorrhage.  He was ultimately discharged and started back on his Coumadin.  While the patient was in the hospital.  He ended up developing fever and tested positive for flu influenza.  Patient states he was at home today.  He ended up getting dizzy and fell forward hitting his face.  Patient sustained a small abrasion to his nose.  He is denying any chest pain or shortness of breath.  No severe headache.  No back pain.  No pain in his extremities.  No vomiting or diarrhea.    Home Medications Prior to Admission medications   Medication Sig Start Date End Date Taking? Authorizing Provider  acetaminophen (TYLENOL) 500 MG tablet Take 1,000 mg by mouth every 6 (six) hours as needed for mild pain.   Yes [provider]  albuterol (PROVENTIL) (5 MG/ML) 0.5% nebulizer solution Take 0.5 mLs (2.5 mg total) by nebulization every 6 (six) hours as needed for wheezing or shortness of breath. 12/27/18  Yes Trifan, Kermit Balo, MD  albuterol (VENTOLIN HFA) 108 (90 Base) MCG/ACT inhaler Inhale 2 puffs into the lungs every 6 (six) hours as needed for wheezing or shortness of breath.   Yes [provider]   DULoxetine (CYMBALTA) 60 MG capsule Take 60 mg by mouth daily. Take one capsule (60 mg) along with 30 mg capsule to equal 90 mg daily   Yes [provider]  DULoxetine HCl 30 MG CSDR Take 30 mg by mouth daily. 11/02/21  Yes [provider]  Fluticasone-Umeclidin-Vilant (TRELEGY ELLIPTA IN) Inhale 1 puff into the lungs daily.   Yes [provider]  gabapentin (NEURONTIN) 600 MG tablet Take 600 mg by mouth 2 (two) times daily. 09/02/21  Yes [provider]  glimepiride (AMARYL) 4 MG tablet Take 1 tablet (4 mg total) by mouth daily with breakfast. 02/19/22  Yes Lama, Sarina Ill, MD  ipratropium-albuterol (DUONEB) 0.5-2.5 (3) MG/3ML SOLN Take 3 mLs by nebulization every 4 (four) hours as needed (For shortness of breath or wheezing).   Yes [provider]  metoprolol tartrate (LOPRESSOR) 25 MG tablet Take 0.5 tablets (12.5 mg total) by mouth 2 (two) times daily. Patient taking differently: Take 12.5 mg by mouth every evening. 02/19/22  Yes Meredeth Ide, MD  nitroGLYCERIN (NITROSTAT) 0.4 MG SL tablet Place 0.4 mg under the tongue every 5 (five) minutes as needed for chest pain.  07/07/16  Yes [provider]  OXYGEN Inhale 4-6 L into the lungs daily.   Yes [provider]  pantoprazole (PROTONIX) 40 MG tablet Take 40 mg by mouth 2 (two) times daily.   Yes [provider]  rosuvastatin (CRESTOR) 10 MG tablet Take 10 mg by mouth every evening.  07/07/16  Yes [provider]  tamsulosin (FLOMAX) 0.4 MG CAPS capsule Take 1 capsule (0.4 mg total) by mouth at bedtime. Patient taking differently: Take 0.8 mg by mouth daily. 02/06/22  Yes Wouk, Wilfred Curtis, MD  topiramate (TOPAMAX) 50 MG tablet Take 1.5 tablets (75 mg total) by mouth 2 (two) times daily. Patient taking differently: Take 75 mg by mouth 3 (three) times daily. 02/06/22  Yes Wouk, Wilfred Curtis, MD  torsemide (DEMADEX) 20 MG tablet Take 20 mg by mouth daily.   Yes [provider]  warfarin (COUMADIN) 5 MG tablet Take 5 mg by mouth every evening.   Yes [provider]  oseltamivir (TAMIFLU) 75 MG capsule Take 1 capsule (75 mg total) by mouth once for 1 dose. Patient not taking: Reported on 02/20/2022 02/19/22 02/20/22  Meredeth Ide, MD  senna-docusate (SENOKOT-S) 8.6-50 MG tablet Take 1 tablet by mouth 2 (two) times daily as needed for moderate constipation. Patient not taking: Reported on 02/20/2022 01/26/22   Dimple Nanas, MD      Allergies    Melatonin and Altace [ramipril]    Review of Systems   Review of Systems  Physical Exam Updated Vital Signs BP 137/72   Pulse 80   Temp 99.1 F (37.3 C) (Oral)   Resp 19   SpO2 99%  Physical Exam Vitals and nursing note reviewed.  Constitutional:      Appearance: He is well-developed. He is not diaphoretic.     Comments: Abrasion noted to the nose, elevated BMI  HENT:     Head: Normocephalic.     Comments: Abrasion noted to the nose    Right Ear: External ear normal.     Left Ear: External ear normal.  Eyes:     General: No scleral icterus.       Right eye: No discharge.        Left eye: No discharge.     Conjunctiva/sclera: Conjunctivae normal.  Neck:     Trachea: No tracheal deviation.  Cardiovascular:     Rate and Rhythm: Normal rate and regular rhythm.  Pulmonary:     Effort: Pulmonary effort is normal. No respiratory distress.     Breath sounds: Normal breath sounds. No stridor. No wheezing or rales.  Abdominal:     General: Bowel sounds are normal. There is no distension.     Palpations: Abdomen is soft.     Tenderness: There is no abdominal tenderness. There is no guarding or rebound.  Musculoskeletal:        General: No tenderness or deformity.     Cervical back: Neck supple.     Comments: No spinal tenderness no tenderness in the upper or lower extremities  Skin:    General: Skin is warm and dry.     Findings: No rash.  Neurological:     General: No focal  deficit present.     Mental Status: He is alert.     Cranial Nerves: No cranial nerve deficit, dysarthria or facial asymmetry.     Sensory: No sensory deficit.     Motor: No abnormal muscle tone or seizure activity.     Coordination: Coordination normal.  Psychiatric:        Mood and Affect: Mood normal.     ED Results / Procedures / Treatments   Labs (all labs ordered are listed, but only abnormal results are displayed) Labs Reviewed  BASIC METABOLIC PANEL - Abnormal; Notable for the following components:  Result Value   Glucose, Bld 151 (*)    BUN 25 (*)    All other components within normal limits  CBC WITH DIFFERENTIAL/PLATELET - Abnormal; Notable for the following components:   RBC 3.25 (*)    Hemoglobin 9.0 (*)    HCT 30.1 (*)    MCHC 29.9 (*)    RDW 16.1 (*)    All other components within normal limits  CBG MONITORING, ED - Abnormal; Notable for the following components:   Glucose-Capillary 141 (*)    All other components within normal limits  I-STAT CHEM 8, ED - Abnormal; Notable for the following components:   BUN 25 (*)    Glucose, Bld 150 (*)    Calcium, Ion 1.12 (*)    Hemoglobin 9.9 (*)    HCT 29.0 (*)    All other components within normal limits  PROTIME-INR  TROPONIN I (HIGH SENSITIVITY)  TROPONIN I (HIGH SENSITIVITY)    EKG EKG Interpretation  Date/Time:  Friday February 20 2022 19:00:54 EST Ventricular Rate:  75 PR Interval:  159 QRS Duration: 172 QT Interval:  466 QTC Calculation: 521 R Axis:   79 Text Interpretation: Sinus rhythm Right bundle branch block No significant change since last tracing Confirmed by Linwood DibblesKnapp, Zora Glendenning [phone]) on 02/20/2022 7:11:37 PM  Radiology CT HEAD WO CONTRAST  Result Date: 02/20/2022 CLINICAL DATA:  Fall history of brain bleed EXAM: CT HEAD WITHOUT CONTRAST CT MAXILLOFACIAL WITHOUT CONTRAST TECHNIQUE: Multidetector CT imaging of the head and maxillofacial structures were performed using the standard protocol without  intravenous contrast. Multiplanar CT image reconstructions of the maxillofacial structures were also generated. RADIATION DOSE REDUCTION: This exam was performed according to the departmental dose-optimization program which includes automated exposure control, adjustment of the mA and/or kV according to patient size and/or use of iterative reconstruction technique. COMPARISON:  CT brain 02/16/2022, 02/15/2022, 02/12/2022 FINDINGS: CT HEAD FINDINGS Brain: No acute territorial infarction or intracranial mass is visualized. Focus of intraparenchymal hemorrhage within the high right frontal lobe, measures 2.2 cm maximum AP on sagittal series 6, image 30, previously 2.2 cm. Hypodense edema in the surrounding brain parenchyma stable to minimally increased. Previously noted extra-axial right parafalcine hemorrhage has largely resolved. Stable ventricle size. Vascular: No hyperdense vessels. Vertebral and carotid vascular calcification Skull: Normal. Negative for fracture or focal lesion. Other: None CT MAXILLOFACIAL FINDINGS Osseous: Mastoid air cells are clear. Mandibular heads are normally position. No mandibular fracture. Pterygoid plates and zygomatic arches appear intact. No acute nasal bone fracture. Dental caries and root lucency central and left maxillary teeth. Orbits: Negative. No traumatic or inflammatory finding. Sinuses: Mild mucosal thickening. No acute fluid level or sinus wall fracture. Soft tissues: Negative. IMPRESSION: 1. Stable size of the right frontal intraparenchymal hemorrhage with surrounding edema but no worsening mass effect or midline shift. Hemorrhage is becoming less dense consistent with evolving hematoma. There is no new hemorrhage identified. Previously noted right parafalcine extra-axial blood has decreased. 2. No acute displaced facial bone fracture Electronically Signed   By: Jasmine PangKim  Fujinaga M.D.   On: 02/20/2022 20:11   CT Maxillofacial Wo Contrast  Result Date: 02/20/2022 CLINICAL  DATA:  Fall history of brain bleed EXAM: CT HEAD WITHOUT CONTRAST CT MAXILLOFACIAL WITHOUT CONTRAST TECHNIQUE: Multidetector CT imaging of the head and maxillofacial structures were performed using the standard protocol without intravenous contrast. Multiplanar CT image reconstructions of the maxillofacial structures were also generated. RADIATION DOSE REDUCTION: This exam was performed according to the departmental dose-optimization program which includes  automated exposure control, adjustment of the mA and/or kV according to patient size and/or use of iterative reconstruction technique. COMPARISON:  CT brain 02/16/2022, 02/15/2022, 02/12/2022 FINDINGS: CT HEAD FINDINGS Brain: No acute territorial infarction or intracranial mass is visualized. Focus of intraparenchymal hemorrhage within the high right frontal lobe, measures 2.2 cm maximum AP on sagittal series 6, image 30, previously 2.2 cm. Hypodense edema in the surrounding brain parenchyma stable to minimally increased. Previously noted extra-axial right parafalcine hemorrhage has largely resolved. Stable ventricle size. Vascular: No hyperdense vessels. Vertebral and carotid vascular calcification Skull: Normal. Negative for fracture or focal lesion. Other: None CT MAXILLOFACIAL FINDINGS Osseous: Mastoid air cells are clear. Mandibular heads are normally position. No mandibular fracture. Pterygoid plates and zygomatic arches appear intact. No acute nasal bone fracture. Dental caries and root lucency central and left maxillary teeth. Orbits: Negative. No traumatic or inflammatory finding. Sinuses: Mild mucosal thickening. No acute fluid level or sinus wall fracture. Soft tissues: Negative. IMPRESSION: 1. Stable size of the right frontal intraparenchymal hemorrhage with surrounding edema but no worsening mass effect or midline shift. Hemorrhage is becoming less dense consistent with evolving hematoma. There is no new hemorrhage identified. Previously noted right  parafalcine extra-axial blood has decreased. 2. No acute displaced facial bone fracture Electronically Signed   By: Jasmine Pang M.D.   On: 02/20/2022 20:11   CT CERVICAL SPINE WO CONTRAST  Result Date: 02/20/2022 CLINICAL DATA:  Dizziness and fall. EXAM: CT CERVICAL SPINE WITHOUT CONTRAST TECHNIQUE: Multidetector CT imaging of the cervical spine was performed without intravenous contrast. Multiplanar CT image reconstructions were also generated. RADIATION DOSE REDUCTION: This exam was performed according to the departmental dose-optimization program which includes automated exposure control, adjustment of the mA and/or kV according to patient size and/or use of iterative reconstruction technique. COMPARISON:  02/12/2022 FINDINGS: Alignment: Straightening of the normal cervical lordosis. Skull base and vertebrae: No fracture or focal bone lesion. Soft tissues and spinal canal: No traumatic soft tissue finding. Disc levels: Chronic degenerative spondylosis from C3-4 through C6-7. Facet osteoarthritis most pronounced on the left at C3-4 and C7-T1 and on the right at C2-3. Bony foraminal narrowing on the right at C2-3, on the left at C3-4, bilateral at C5-6 and on the left at C6-7. Upper chest: Negative Other: None IMPRESSION: No acute or traumatic finding. Chronic degenerative changes as above. Electronically Signed   By: Paulina Fusi M.D.   On: 02/20/2022 19:59   DG Chest Port 1 View  Result Date: 02/20/2022 CLINICAL DATA:  Syncope EXAM: PORTABLE CHEST 1 VIEW COMPARISON:  02/15/2022 FINDINGS: Lungs are clear. No pneumothorax or pleural effusion. Coronary artery bypass grafting has been performed. Cardiac size within normal limits. Pulmonary vascularity is normal. No acute bone abnormality. IMPRESSION: 1. No active disease. Electronically Signed   By: Helyn Numbers M.D.   On: 02/20/2022 19:21    Procedures Procedures    Medications Ordered in ED Medications - No data to display  ED Course/ Medical  Decision Making/ A&P Clinical Course as of 02/20/22 2139  [facility] Feb 20, 2022  2003 CBC WITH DIFFERENTIAL(!) Hemoglobin unchanged compared to baseline [JK]  2003 I-Stat Chem 8, ED(!) [JK]  2004 I-Stat Chem 8, ED(!) No significant electrolyte abnormalities [JK]  2113 CT HEAD WO CONTRAST Head CT shows sequela from previous hemorrhage but no new hemorrhage noted [JK]  2113 No acute findings on C-spine or maxillofacial CT [JK]    Clinical Course User Index [JK] Linwood Dibbles, MD  Medical Decision Making Problems Addressed: Contusion of face, initial encounter: acute illness or injury Syncope, unspecified syncope type: chronic illness or injury with exacerbation, progression, or side effects of treatment  Amount and/or Complexity of Data Reviewed Labs: ordered. Decision-making details documented in ED Course. Radiology: ordered and independent interpretation performed. Decision-making details documented in ED Course. ECG/medicine tests: ordered.   Patient has history of multiple medical problems.  He was recently in the hospital after having a syncopal episode resulting in a head injury.  Patient has resumed his anticoagulation at this point.  He presented again as a level 2 trauma after falling.  Fortunately no signs of any recurrent injury.  No acute abnormality noted on his laboratory test today.  Patient had an extensive workup for his syncope already.  Was felt to be possibly a combination of weakness deconditioning associated with possible cardiogenic syncope.  Patient already has had imaging to rule out vertebrobasilar insufficiency.  He has aortic stenosis but not felt to be severe enough to cause his syncope.  Plan is for outpatient cardiac monitoring.  Considering his extensive evaluation I do not think he warrants readmission for further syncope workup at this time.  Patient is otherwise feeling well and is comfortable with discharge        Final Clinical  Impression(s) / ED Diagnoses Final diagnoses:  Contusion of face, initial encounter  Syncope, unspecified syncope type    Rx / DC Orders ED Discharge Orders     None         Linwood Dibbles, MD 02/20/22 2139

## 2022-02-20 NOTE — ED Triage Notes (Signed)
Pt on warfarin. Released recently from hospital due to brain bleed. Got dizzy today and fell front faced. Not sure what he hit his face on. Bleeding noted on front R of face. Mildly hypertensive.

## 2022-02-20 NOTE — Discharge Instructions (Addendum)
Continue your current medications.  Follow-up with your cardiologist as planned for further evaluation of your recurrent fainting spells.  Return as needed for recurrent symptoms

## 2022-02-22 DIAGNOSIS — R55 Syncope and collapse: Secondary | ICD-10-CM

## 2022-02-23 ENCOUNTER — Ambulatory Visit: Payer: Self-pay | Admitting: Internal Medicine

## 2022-02-23 DIAGNOSIS — J449 Chronic obstructive pulmonary disease, unspecified: Secondary | ICD-10-CM | POA: Diagnosis not present

## 2022-02-23 DIAGNOSIS — R55 Syncope and collapse: Secondary | ICD-10-CM | POA: Diagnosis not present

## 2022-02-23 DIAGNOSIS — J9610 Chronic respiratory failure, unspecified whether with hypoxia or hypercapnia: Secondary | ICD-10-CM | POA: Diagnosis not present

## 2022-02-24 DIAGNOSIS — R55 Syncope and collapse: Secondary | ICD-10-CM | POA: Diagnosis not present

## 2022-02-24 DIAGNOSIS — E1140 Type 2 diabetes mellitus with diabetic neuropathy, unspecified: Secondary | ICD-10-CM | POA: Diagnosis not present

## 2022-02-24 DIAGNOSIS — D6861 Antiphospholipid syndrome: Secondary | ICD-10-CM | POA: Diagnosis not present

## 2022-02-24 DIAGNOSIS — I10 Essential (primary) hypertension: Secondary | ICD-10-CM | POA: Diagnosis not present

## 2022-02-26 DIAGNOSIS — D6861 Antiphospholipid syndrome: Secondary | ICD-10-CM | POA: Diagnosis not present

## 2022-02-26 DIAGNOSIS — I110 Hypertensive heart disease with heart failure: Secondary | ICD-10-CM | POA: Diagnosis not present

## 2022-02-26 DIAGNOSIS — E1151 Type 2 diabetes mellitus with diabetic peripheral angiopathy without gangrene: Secondary | ICD-10-CM | POA: Diagnosis not present

## 2022-02-26 DIAGNOSIS — J9611 Chronic respiratory failure with hypoxia: Secondary | ICD-10-CM | POA: Diagnosis not present

## 2022-02-26 DIAGNOSIS — I700 Atherosclerosis of aorta: Secondary | ICD-10-CM | POA: Diagnosis not present

## 2022-02-26 DIAGNOSIS — S066X0A Traumatic subarachnoid hemorrhage without loss of consciousness, initial encounter: Secondary | ICD-10-CM | POA: Diagnosis not present

## 2022-02-26 DIAGNOSIS — G450 Vertebro-basilar artery syndrome: Secondary | ICD-10-CM | POA: Diagnosis not present

## 2022-02-26 DIAGNOSIS — I5032 Chronic diastolic (congestive) heart failure: Secondary | ICD-10-CM | POA: Diagnosis not present

## 2022-02-26 DIAGNOSIS — J9612 Chronic respiratory failure with hypercapnia: Secondary | ICD-10-CM | POA: Diagnosis not present

## 2022-02-26 DIAGNOSIS — I2510 Atherosclerotic heart disease of native coronary artery without angina pectoris: Secondary | ICD-10-CM | POA: Diagnosis not present

## 2022-02-26 DIAGNOSIS — I352 Nonrheumatic aortic (valve) stenosis with insufficiency: Secondary | ICD-10-CM | POA: Diagnosis not present

## 2022-02-26 DIAGNOSIS — J15212 Pneumonia due to Methicillin resistant Staphylococcus aureus: Secondary | ICD-10-CM | POA: Diagnosis not present

## 2022-02-26 DIAGNOSIS — S300XXD Contusion of lower back and pelvis, subsequent encounter: Secondary | ICD-10-CM | POA: Diagnosis not present

## 2022-02-26 DIAGNOSIS — E1169 Type 2 diabetes mellitus with other specified complication: Secondary | ICD-10-CM | POA: Diagnosis not present

## 2022-02-26 DIAGNOSIS — J440 Chronic obstructive pulmonary disease with acute lower respiratory infection: Secondary | ICD-10-CM | POA: Diagnosis not present

## 2022-02-26 DIAGNOSIS — D62 Acute posthemorrhagic anemia: Secondary | ICD-10-CM | POA: Diagnosis not present

## 2022-02-27 DIAGNOSIS — G909 Disorder of the autonomic nervous system, unspecified: Secondary | ICD-10-CM | POA: Diagnosis not present

## 2022-02-27 DIAGNOSIS — G4752 REM sleep behavior disorder: Secondary | ICD-10-CM | POA: Diagnosis not present

## 2022-03-02 ENCOUNTER — Ambulatory Visit: Payer: Medicare Other | Admitting: Podiatry

## 2022-03-03 ENCOUNTER — Ambulatory Visit: Payer: Medicare Other | Admitting: Cardiology

## 2022-03-04 ENCOUNTER — Ambulatory Visit: Payer: Medicare Other | Admitting: Internal Medicine

## 2022-03-06 ENCOUNTER — Telehealth: Payer: Self-pay | Admitting: Physician Assistant

## 2022-03-06 NOTE — Telephone Encounter (Signed)
Patient states he wants to remove his zio patch today so he can get it in the mail today and it can be shipped before the holidays and won't be delayed. He says he is due to take it off Sunday so this is 2 days early. He says he asked the zio company and they said he needed to ask the office. He also wants someone to call and explain what he is being seen for by Dr. Lalla Brothers and who he is. He states it is all very confusing and needs clarification.

## 2022-03-06 NOTE — Telephone Encounter (Signed)
Pt is going to take off monitor .Encouraged to leave on for the remaining 2 days but pt has concerns re getting results back in timely manner with the holidays  and also pt notes has been approx 3 weeks since was able to shower  .Stann Mainland forward to Francis Dowse PA for review .Zack Seal

## 2022-03-26 DIAGNOSIS — J9610 Chronic respiratory failure, unspecified whether with hypoxia or hypercapnia: Secondary | ICD-10-CM | POA: Diagnosis not present

## 2022-03-26 DIAGNOSIS — J449 Chronic obstructive pulmonary disease, unspecified: Secondary | ICD-10-CM | POA: Diagnosis not present

## 2022-03-26 DIAGNOSIS — R55 Syncope and collapse: Secondary | ICD-10-CM | POA: Diagnosis not present

## 2022-03-28 DIAGNOSIS — I5032 Chronic diastolic (congestive) heart failure: Secondary | ICD-10-CM | POA: Diagnosis not present

## 2022-03-28 DIAGNOSIS — J449 Chronic obstructive pulmonary disease, unspecified: Secondary | ICD-10-CM | POA: Diagnosis not present

## 2022-03-28 DIAGNOSIS — E1151 Type 2 diabetes mellitus with diabetic peripheral angiopathy without gangrene: Secondary | ICD-10-CM | POA: Diagnosis not present

## 2022-03-28 DIAGNOSIS — I110 Hypertensive heart disease with heart failure: Secondary | ICD-10-CM | POA: Diagnosis not present

## 2022-03-30 NOTE — Progress Notes (Signed)
Cardiology Office Note Date:  04/01/2022  Patient ID:  Jose [name] 1955-03-31, MRN [id number] PCP:  [name], MD  Cardiologist:  None Electrophysiologist: Lanier Prude, MD   Chief Complaint: post-hospital follow-up  History of Present Illness: Jose [name] is a 67 y.o. male with PMH notable for CAD s/p CABG, HLD, AoV stenosis, antiphospholipid syndrome on chronic anticoag (coumadin), COPD, PE, T2DM, vertebrobasilar insufficiency, OSA, recurrent syncope; seen today for Lanier Prude, MD for post hospital follow up.    Admitted 02/12/22-02/19/22. Briefly, he sustained a fall at home 11/30. He has been hospitalized for similar at [facility] x 2 earlier in November. CT scan of head noted a small area of subarachnoid hemorrhage, labs normal. Neurosx recommended serial CT scans. He underwent LHC on 12/1, did not have any lesions to intervene upon.   On the morning of 12/3, patient had increased confusion, repeat CT scan showed no change from prior.  He has undergone Neurology evaluation including tilt test, EEG, MRIs, and CTAs in the past without identified syncope cause. 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  EP was asked to weigh in about ILR monitor. Unfortunately, patient was positive for influenza A during hospitalization, so a 2 week zio was placed instead.   He re-presented again to [facility] 12/8 with facial contusion d/t dizziness and fall.   Since discharge from hospital the patient reports doing about the same. He has continued to syncopize. He saw recently saw neurology (Dr. 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  .  he denies chest pain, palpitations, dyspnea, PND, orthopnea, nausea, vomiting, weight gain, or early satiety.     Past Medical History:  Diagnosis Date   Antiphospholipid antibody positive    Antiphospholipid antibody syndrome (HCC)    CAD  (coronary artery disease)    CAD S/P percutaneous coronary angioplasty '98, '04, Feb 2017   [facility]   Carotid arterial disease (HCC) 10/2015   moderate bilateral   COPD (chronic obstructive pulmonary disease) (HCC)    Coronary artery disease    DM (diabetes mellitus) (HCC)    HTN (hypertension)    Hypertension    Non-insulin treated type 2 diabetes mellitus (HCC)    OSA (obstructive sleep apnea)    Pulmonary emboli (HCC) 2011   RBBB    Sleep apnea    Vertebrobasilar artery insufficiency     Past Surgical History:  Procedure Laterality Date   APPENDECTOMY     APPLICATION OF WOUND VAC Left 03/18/2019   Procedure: Application Of Wound Vac;  Surgeon: Nadara Mustard, MD;  Location: [facility] OR;  Service: Orthopedics;  Laterality: Left;   BACK SURGERY     BACK SURGERY  '89, '06   COLONOSCOPY     CORONARY ANGIOPLASTY WITH STENT PLACEMENT     CORONARY ANGIOPLASTY WITH STENT PLACEMENT  '98, '04, Feb 2017   I & D EXTREMITY Left 03/15/2019   Procedure: DEBRIDEMENT LEFT THIGH;  Surgeon: Nadara Mustard, MD;  Location: [facility] OR;  Service: Orthopedics;  Laterality: Left;   INCISION AND DRAINAGE OF WOUND Left 03/18/2019   Procedure: LEFT THIGH DEBRIDEMENT;  Surgeon: Nadara Mustard, MD;  Location: [facility] OR;  Service: Orthopedics;  Laterality: Left;   RIGHT/LEFT HEART CATH AND CORONARY/GRAFT ANGIOGRAPHY N/A 02/13/2022   Procedure: RIGHT/LEFT HEART CATH AND CORONARY/GRAFT ANGIOGRAPHY;  Surgeon: Yates Decamp, MD;  Location: [facility] INVASIVE CV LAB;  Service:  Cardiovascular;  Laterality: N/A;    Current Outpatient Medications  Medication Instructions   acetaminophen (TYLENOL) 1,000 mg, Oral, Every 6 hours PRN   albuterol (PROVENTIL) 2.5 mg, Nebulization, Every 6 hours PRN   albuterol (VENTOLIN HFA) 108 (90 Base) MCG/ACT inhaler 2 puffs, Inhalation, Every 6 hours PRN   DULoxetine (CYMBALTA) 60 mg, Oral, Daily, Take one capsule (60 mg) along with 30 mg capsule to equal 90 mg daily    DULoxetine (CYMBALTA) 30 mg, Oral, Daily    Fluticasone-Umeclidin-Vilant (TRELEGY ELLIPTA IN) 1 puff, Inhalation, Daily   gabapentin (NEURONTIN) 600 mg, Oral, 2 times daily   glimepiride (AMARYL) 4 mg, Oral, Daily with breakfast   metoprolol tartrate (LOPRESSOR) 12.5 mg, Oral, 2 times daily   nitroGLYCERIN (NITROSTAT) 0.4 mg, Sublingual, Every 5 min PRN   OXYGEN 4-6 L, Inhalation, Daily   pantoprazole (PROTONIX) 40 mg, Oral, 2 times daily   rosuvastatin (CRESTOR) 10 mg, Oral, Every evening   tamsulosin (FLOMAX) 0.4 mg, Oral, Daily at bedtime   topiramate (TOPAMAX) 75 mg, Oral, 2 times daily   torsemide (DEMADEX) 20 mg, Oral, Daily   warfarin (COUMADIN) 5 mg, Oral, Every evening   Social History:  The patient  reports that he quit smoking about 3 years ago. His smoking use included cigarettes. He has a 20.00 pack-year smoking history. He has never used smokeless tobacco. He reports that he does not drink alcohol and does not use drugs.   Family History:  The patient's family history includes CAD (age of onset: 70) in his father.  ROS:  Please see the history of present illness. All other systems are reviewed and otherwise negative.   PHYSICAL EXAM:  VS:  BP 118/70   Pulse 84   Ht 5\' 8"  (1.727 m)   Wt 250 lb (113.4 kg)   BMI 38.01 kg/m  BMI: Body mass index is 38.01 kg/m.  GEN- The patient is well appearing, alert and oriented x 3 today.  Obese.  On supplemental oxygen Heart- Regular rate and rhythm, 2 out of 6 crescendo decrescendo systolic murmur at the upper sternal borders Extremities-1+ pitting bilateral lower extremity edema, appears chronic with scaling .   Recent Labs: 02/05/2022: Magnesium 2.6 02/16/2022: B Natriuretic Peptide 318.1 02/19/2022: ALT 31 02/20/2022: BUN 25; Creatinine, Ser 1.10; Hemoglobin 9.9; Platelets 254; Potassium 3.5; Sodium 138  02/04/2022: Triglycerides 133   CrCl cannot be calculated (Patient's most recent lab result is older than the maximum 21 days allowed.).   Wt Readings from Last 3  Encounters:  04/01/22 250 lb (113.4 kg)  02/20/22 235 lb 14.3 oz (107 kg)  02/12/22 235 lb 14.3 oz (107 kg)      ASSESSMENT AND PLAN:  #) Syncope Patient had mutliple episodes of syncope while wearing ambulatory monitor: 12/12, 16, 19, 22x4 These episdoes do not correlate with arrythmia or pauses.  Recommended continue workup and evaluation with neurology. The patient requests evaluation by dysautonomia provider, referral made ot Dr. Margrett Rud at [place]  #) CAD - no chest pain or concerning symptoms #) HFpEF  Stable fluid status on torsemide 20mg  daily  Current medicines are reviewed at length with the patient today.   The patient does not have concerns regarding his medicines.  The following changes were made today:  none  Labs/ tests ordered today include:  No orders of the defined types were placed in this encounter.     SignedMamie Levers, NP  04/01/22 10:31 AM   [facility] [address] [place] [address] [phone] (office)  [phone] (fax)   -------------------------------------------  I have seen, examined the patient, and reviewed the above assessment and plan.    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  He had multiple syncopal episodes while wearing a ZIO monitor.  There were no abnormal heart rhythms recorded during the symptom triggered events or elsewhere.  I suspect his syncopal episodes are completely secondary to dysautonomia.  We discussed the pathophysiology of dysautonomia during today's clinic appointment.  I recommended wearing at least knee-high compression stockings.  He should stay as active as possible with a regimen emphasizing recumbent exercises if at all possible.  We discussed elevating the head of the bed although he does sleep in a recliner.   He has follow-up scheduled with a neurologist who he has previously seen for this diagnosis.  I have also put in a referral to [facility] based dysautonomia  clinic.  He will continue to follow-up with [facility] cardiology regarding his valve disease.  He will follow-up with me on an as-needed basis.  Vickie Epley, MD 04/01/2022 12:13 PM

## 2022-04-01 ENCOUNTER — Encounter: Payer: Self-pay | Admitting: Cardiology

## 2022-04-01 ENCOUNTER — Ambulatory Visit: Payer: Medicare Other | Attending: Cardiology | Admitting: Cardiology

## 2022-04-01 VITALS — BP 118/70 | HR 84 | Ht 68.0 in | Wt 250.0 lb

## 2022-04-01 DIAGNOSIS — R55 Syncope and collapse: Secondary | ICD-10-CM | POA: Diagnosis not present

## 2022-04-01 DIAGNOSIS — I2510 Atherosclerotic heart disease of native coronary artery without angina pectoris: Secondary | ICD-10-CM

## 2022-04-01 DIAGNOSIS — I5032 Chronic diastolic (congestive) heart failure: Secondary | ICD-10-CM | POA: Diagnosis not present

## 2022-04-01 DIAGNOSIS — I2583 Coronary atherosclerosis due to lipid rich plaque: Secondary | ICD-10-CM

## 2022-04-01 DIAGNOSIS — I10 Essential (primary) hypertension: Secondary | ICD-10-CM

## 2022-04-01 DIAGNOSIS — I4510 Unspecified right bundle-branch block: Secondary | ICD-10-CM | POA: Diagnosis not present

## 2022-04-01 NOTE — Patient Instructions (Addendum)
Dysautonomia - Compression socks - elevate head of bed when sleeping - recumbent exercises: bike, rowing   Medication Instructions:   Your physician recommends that you continue on your current medications as directed. Please refer to the Current Medication list given to you today.  *If you need a refill on your cardiac medications before your next appointment, please call your pharmacy*   Lab Work:  None Ordered  If you have labs (blood work) drawn today and your tests are completely normal, you will receive your results only by: [place] (if you have MyChart) OR A paper copy in the mail If you have any lab test that is abnormal or we need to change your treatment, we will call you to review the results.   Testing/Procedures:  None Ordered   Follow-Up: At [facility], you and your health needs are our priority.  As part of our continuing mission to provide you with exceptional heart care, we have created designated Provider Care Teams.  These Care Teams include your primary Cardiologist (physician) and Advanced Practice Providers (APPs -  Physician Assistants and Nurse Practitioners) who all work together to provide you with the care you need, when you need it.  We recommend signing up for the patient portal called "MyChart".  Sign up information is provided on this After Visit Summary.  MyChart is used to connect with patients for Virtual Visits (Telemedicine).  Patients are able to view lab/test results, encounter notes, upcoming appointments, etc.  Non-urgent messages can be sent to your provider as well.   To learn more about what you can do with MyChart, go to [email or web address].    Your next appointment:    As needed

## 2022-04-01 NOTE — Addendum Note (Signed)
Addended by: Bernestine Amass on: 04/01/2022 01:55 PM   Modules accepted: Orders

## 2022-04-03 ENCOUNTER — Telehealth: Payer: Self-pay | Admitting: Cardiology

## 2022-04-03 NOTE — Telephone Encounter (Signed)
Patient called to request Dr. Mardene Speak referral to Dr. Margrett Rud, [facility]. be put in the patient's MyChart or faxed to [facility] so the patient can follow-up.

## 2022-04-03 NOTE — Telephone Encounter (Signed)
Left message for patient to call back.

## 2022-04-06 DIAGNOSIS — R55 Syncope and collapse: Secondary | ICD-10-CM | POA: Diagnosis not present

## 2022-04-06 DIAGNOSIS — J9611 Chronic respiratory failure with hypoxia: Secondary | ICD-10-CM | POA: Diagnosis not present

## 2022-04-06 DIAGNOSIS — I10 Essential (primary) hypertension: Secondary | ICD-10-CM | POA: Diagnosis not present

## 2022-04-06 NOTE — Progress Notes (Signed)
No show

## 2022-04-23 DIAGNOSIS — R55 Syncope and collapse: Secondary | ICD-10-CM | POA: Diagnosis not present

## 2022-04-23 DIAGNOSIS — G894 Chronic pain syndrome: Secondary | ICD-10-CM | POA: Insufficient documentation

## 2022-04-23 DIAGNOSIS — R202 Paresthesia of skin: Secondary | ICD-10-CM | POA: Diagnosis not present

## 2022-04-23 DIAGNOSIS — M5441 Lumbago with sciatica, right side: Secondary | ICD-10-CM | POA: Diagnosis not present

## 2022-04-23 DIAGNOSIS — G909 Disorder of the autonomic nervous system, unspecified: Secondary | ICD-10-CM | POA: Diagnosis not present

## 2022-04-26 DIAGNOSIS — R55 Syncope and collapse: Secondary | ICD-10-CM | POA: Diagnosis not present

## 2022-04-26 DIAGNOSIS — J9610 Chronic respiratory failure, unspecified whether with hypoxia or hypercapnia: Secondary | ICD-10-CM | POA: Diagnosis not present

## 2022-04-26 DIAGNOSIS — J449 Chronic obstructive pulmonary disease, unspecified: Secondary | ICD-10-CM | POA: Diagnosis not present

## 2022-05-11 ENCOUNTER — Telehealth: Payer: Self-pay

## 2022-05-11 NOTE — Telephone Encounter (Signed)
(  9:35 am) PC SW contacted patient to discuss referral and scheduling an appointment. Patient declined services at this time.

## 2022-05-12 DIAGNOSIS — R55 Syncope and collapse: Secondary | ICD-10-CM | POA: Diagnosis not present

## 2022-05-13 DIAGNOSIS — R55 Syncope and collapse: Secondary | ICD-10-CM | POA: Diagnosis not present

## 2022-05-13 DIAGNOSIS — G901 Familial dysautonomia [Riley-Day]: Secondary | ICD-10-CM | POA: Diagnosis not present

## 2022-05-25 DIAGNOSIS — J449 Chronic obstructive pulmonary disease, unspecified: Secondary | ICD-10-CM | POA: Diagnosis not present

## 2022-05-25 DIAGNOSIS — R55 Syncope and collapse: Secondary | ICD-10-CM | POA: Diagnosis not present

## 2022-05-25 DIAGNOSIS — J9610 Chronic respiratory failure, unspecified whether with hypoxia or hypercapnia: Secondary | ICD-10-CM | POA: Diagnosis not present

## 2022-06-03 ENCOUNTER — Other Ambulatory Visit: Payer: Self-pay

## 2022-06-03 ENCOUNTER — Emergency Department (HOSPITAL_COMMUNITY)
Admission: EM | Admit: 2022-06-03 | Discharge: 2022-06-06 | Disposition: A | Discharge status: Home / Self Care | Payer: Medicare Other | Attending: Emergency Medicine | Admitting: Emergency Medicine

## 2022-06-03 DIAGNOSIS — Z630 Problems in relationship with spouse or partner: Secondary | ICD-10-CM

## 2022-06-03 DIAGNOSIS — Z7984 Long term (current) use of oral hypoglycemic drugs: Secondary | ICD-10-CM | POA: Insufficient documentation

## 2022-06-03 DIAGNOSIS — Z7951 Long term (current) use of inhaled steroids: Secondary | ICD-10-CM | POA: Insufficient documentation

## 2022-06-03 DIAGNOSIS — Z7901 Long term (current) use of anticoagulants: Secondary | ICD-10-CM | POA: Diagnosis not present

## 2022-06-03 DIAGNOSIS — R6889 Other general symptoms and signs: Secondary | ICD-10-CM | POA: Diagnosis not present

## 2022-06-03 DIAGNOSIS — R45851 Suicidal ideations: Secondary | ICD-10-CM | POA: Diagnosis not present

## 2022-06-03 DIAGNOSIS — Z79899 Other long term (current) drug therapy: Secondary | ICD-10-CM | POA: Insufficient documentation

## 2022-06-03 DIAGNOSIS — R259 Unspecified abnormal involuntary movements: Secondary | ICD-10-CM | POA: Diagnosis not present

## 2022-06-03 DIAGNOSIS — I110 Hypertensive heart disease with heart failure: Secondary | ICD-10-CM | POA: Diagnosis not present

## 2022-06-03 DIAGNOSIS — Z046 Encounter for general psychiatric examination, requested by authority: Secondary | ICD-10-CM | POA: Diagnosis not present

## 2022-06-03 DIAGNOSIS — I2510 Atherosclerotic heart disease of native coronary artery without angina pectoris: Secondary | ICD-10-CM | POA: Insufficient documentation

## 2022-06-03 DIAGNOSIS — R9431 Abnormal electrocardiogram [ECG] [EKG]: Secondary | ICD-10-CM | POA: Diagnosis not present

## 2022-06-03 DIAGNOSIS — F331 Major depressive disorder, recurrent, moderate: Secondary | ICD-10-CM | POA: Insufficient documentation

## 2022-06-03 DIAGNOSIS — E119 Type 2 diabetes mellitus without complications: Secondary | ICD-10-CM | POA: Insufficient documentation

## 2022-06-03 DIAGNOSIS — Z955 Presence of coronary angioplasty implant and graft: Secondary | ICD-10-CM | POA: Diagnosis not present

## 2022-06-03 DIAGNOSIS — J449 Chronic obstructive pulmonary disease, unspecified: Secondary | ICD-10-CM | POA: Insufficient documentation

## 2022-06-03 DIAGNOSIS — I509 Heart failure, unspecified: Secondary | ICD-10-CM | POA: Diagnosis not present

## 2022-06-03 MED ORDER — DULOXETINE HCL 30 MG PO CPEP
30.0000 mg | ORAL_CAPSULE | Freq: Every day | ORAL | Status: DC
  Administered 2022-06-04 – 2022-06-06 (×3): 30 mg via ORAL
  Filled 2022-06-03 (×3): qty 1

## 2022-06-03 MED ORDER — GLIMEPIRIDE 4 MG PO TABS
4.0000 mg | ORAL_TABLET | Freq: Every day | ORAL | Status: DC
  Administered 2022-06-04 – 2022-06-06 (×3): 4 mg via ORAL
  Filled 2022-06-03 (×3): qty 1

## 2022-06-03 MED ORDER — ACETAMINOPHEN 500 MG PO TABS
1000.0000 mg | ORAL_TABLET | Freq: Three times a day (TID) | ORAL | Status: DC | PRN
  Administered 2022-06-04: 1000 mg via ORAL
  Filled 2022-06-03: qty 2

## 2022-06-03 MED ORDER — METOPROLOL TARTRATE 25 MG PO TABS
12.5000 mg | ORAL_TABLET | Freq: Two times a day (BID) | ORAL | Status: DC
  Administered 2022-06-04 – 2022-06-06 (×3): 12.5 mg via ORAL
  Filled 2022-06-03 (×5): qty 1

## 2022-06-03 MED ORDER — WARFARIN SODIUM 5 MG PO TABS
5.0000 mg | ORAL_TABLET | Freq: Every day | ORAL | Status: DC
  Administered 2022-06-04 – 2022-06-05 (×3): 5 mg via ORAL
  Filled 2022-06-03 (×5): qty 1

## 2022-06-03 MED ORDER — TOPIRAMATE 25 MG PO TABS
75.0000 mg | ORAL_TABLET | Freq: Two times a day (BID) | ORAL | Status: DC
  Administered 2022-06-04 – 2022-06-06 (×5): 75 mg via ORAL
  Filled 2022-06-03 (×5): qty 3

## 2022-06-03 MED ORDER — WARFARIN - PHYSICIAN DOSING INPATIENT: Freq: Every day | Status: DC

## 2022-06-03 MED ORDER — PANTOPRAZOLE SODIUM 40 MG PO TBEC
40.0000 mg | DELAYED_RELEASE_TABLET | Freq: Every day | ORAL | Status: DC
  Administered 2022-06-04 – 2022-06-06 (×3): 40 mg via ORAL
  Filled 2022-06-03 (×3): qty 1

## 2022-06-03 NOTE — ED Provider Notes (Signed)
[place] EMERGENCY DEPARTMENT AT [facility] Provider Note   CSN: [id number] Arrival date & time: 06/03/22  1730     History  No chief complaint on file.   Deshannon Demarce is a 67 y.o. male with vertebral artery insufficiency, antiphospholipid antibodies syndrome, hypertension, diabetes, obstructive sleep apnea, CAD s/p multiple PCIs s/p CABG x3, HFpEF, PAD, COPD and recurrent episodes of syncope, on chronic oxygen/chronic warfarin anticoagulation, who presents with IVC.  Patient is brought in in police custody.  Patient was IVC by his wife after an argument today with finances.  Per the IVC patient stated that he was going to take a bunch of pills in order to kill himself.  Currently patient denies stating that and he says he does not remember saying it, but if he did he states he is not sense of humor and he must of been joking.  He denies any SI HI AH/VH.  Denies any drug or alcohol use.  States he has never tried to harm himself and does not want to.  Patient states he feels safe at home and he lives with his wife at this time.  From a medical standpoint, patient requires continuous O2 at 4L [place].  He has recurrent syncope and many medical problems and is wheelchair-bound at baseline.  Pt has no acute complaints. Is at his baseline medically.   HPI     Home Medications Prior to Admission medications   Medication Sig Start Date End Date Taking? Authorizing Provider  acetaminophen (TYLENOL) 500 MG tablet Take 1,000 mg by mouth every 6 (six) hours as needed for mild pain.    [provider]  albuterol (PROVENTIL) (5 MG/ML) 0.5% nebulizer solution Take 0.5 mLs (2.5 mg total) by nebulization every 6 (six) hours as needed for wheezing or shortness of breath. 12/27/18   Wyvonnia Dusky, MD  albuterol (VENTOLIN HFA) 108 (90 Base) MCG/ACT inhaler Inhale 2 puffs into the lungs every 6 (six) hours as needed for wheezing or shortness of breath.    [provider]   DULoxetine (CYMBALTA) 60 MG capsule Take 60 mg by mouth daily. Take one capsule (60 mg) along with 30 mg capsule to equal 90 mg daily    [provider]  DULoxetine HCl 30 MG CSDR Take 30 mg by mouth daily. 11/02/21   [provider]  Fluticasone-Umeclidin-Vilant (TRELEGY ELLIPTA IN) Inhale 1 puff into the lungs daily.    [provider]  gabapentin (NEURONTIN) 600 MG tablet Take 600 mg by mouth 2 (two) times daily. 09/02/21   [provider]  glimepiride (AMARYL) 4 MG tablet Take 1 tablet (4 mg total) by mouth daily with breakfast. 02/19/22   Oswald Hillock, MD  metoprolol tartrate (LOPRESSOR) 25 MG tablet Take 0.5 tablets (12.5 mg total) by mouth 2 (two) times daily. Patient not taking: Reported on 04/01/2022 02/19/22   Oswald Hillock, MD  nitroGLYCERIN (NITROSTAT) 0.4 MG SL tablet Place 0.4 mg under the tongue every 5 (five) minutes as needed for chest pain.  07/07/16   [provider]  OXYGEN Inhale 4-6 L into the lungs daily.    [provider]  pantoprazole (PROTONIX) 40 MG tablet Take 40 mg by mouth 2 (two) times daily.    [provider]  rosuvastatin (CRESTOR) 10 MG tablet Take 10 mg by mouth every evening. 07/07/16   [provider]  tamsulosin (FLOMAX) 0.4 MG CAPS capsule Take 1 capsule (0.4 mg total) by mouth at bedtime.  Patient taking differently: Take 0.8 mg by mouth daily. 02/06/22   Wouk, Ailene Rud, MD  topiramate (TOPAMAX) 50 MG tablet Take 1.5 tablets (75 mg total) by mouth 2 (two) times daily. Patient taking differently: Take 75 mg by mouth 3 (three) times daily. 02/06/22   Wouk, Ailene Rud, MD  torsemide (DEMADEX) 20 MG tablet Take 20 mg by mouth daily.    [provider]  warfarin (COUMADIN) 5 MG tablet Take 5 mg by mouth every evening.    [provider]      Allergies    Melatonin and Altace [ramipril]    Review of Systems   Review of Systems Review of systems Negative for f/c, CP,  SOB.  A 10 point review of systems was performed and is negative unless otherwise reported in HPI.  Physical Exam Updated Vital Signs BP (!) 141/93   Pulse 79   Temp 97.8 F (36.6 C) (Oral)   Resp 18   Ht 5\' 8"  (1.727 m)   Wt 111.1 kg   SpO2 95%   BMI 37.25 kg/m  Physical Exam General: Normal appearing male, lying in bed.  HEENT: Sclera anicteric, MMM, trachea midline.  Cardiology: RRR, no murmurs/rubs/gallops.  Resp: on 4L [place]. Normal respiratory rate and effort. CTAB, no wheezes, rhonchi, crackles.  Abd: Soft, non-tender, non-distended. No rebound tenderness or guarding.  GU: Deferred. MSK: 2+ peripheral edema with dry skin on bilateral LEs, symmetrically. Extremities without deformity or TTP. No cyanosis or clubbing. Skin: warm, dry. Neuro: A&Ox4, CNs II-XII grossly intact. MAEs. Sensation grossly intact.  Psych: Normal mood and affect.   ED Results / Procedures / Treatments   Labs (all labs ordered are listed, but only abnormal results are displayed) Labs Reviewed  PROTIME-INR    EKG EKG Interpretation  Date/Time:  Wednesday June 03 2022 17:44:51 EDT Ventricular Rate:  81 PR Interval:  149 QRS Duration: 157 QT Interval:  439 QTC Calculation: 510 R Axis:   54 Text Interpretation: Sinus rhythm Right bundle branch block Confirmed by Cindee Lame [phone]) on 06/03/2022 7:39:49 PM  Radiology No results found.  Procedures Procedures    Medications Ordered in ED Medications  acetaminophen (TYLENOL) tablet 1,000 mg (has no administration in time range)  warfarin (COUMADIN) tablet 5 mg (has no administration in time range)  topiramate (TOPAMAX) tablet 75 mg (has no administration in time range)  metoprolol tartrate (LOPRESSOR) tablet 12.5 mg (has no administration in time range)  pantoprazole (PROTONIX) EC tablet 40 mg (has no administration in time range)  glimepiride (AMARYL) tablet 4 mg (has no administration in time range)  DULoxetine (CYMBALTA) DR capsule 30  mg (has no administration in time range)  Warfarin - Physician Dosing Inpatient (has no administration in time range)    ED Course/ Medical Decision Making/ A&P                          Medical Decision Making Amount and/or Complexity of Data Reviewed Labs: ordered.  Risk OTC drugs. Prescription drug management.    This patient presents to the ED for concern of IVC for SI.  He is vitally stable on his home 4L [place] and has no medical complaints.  Currently denies SI.  MDM:    Patient was IVC'd for SI by spouse. IVC has already been filed and patient is in police custody. Patient has no complaints medically and currently denies any SI/HI. He is alert and oriented x 4, not responding  to any internal stimuli, is goal-directed and logical on interview.  EKG without any arrhythmia or signs of ischemia.  Patient is medically cleared given that he is at his baseline and has no acute medical complaints.  He is consulted to TTS for further evaluation.  Patient's home meds are ordered and he is signed out to the oncoming emergency physician      Additional history obtained from chart review.    Social Determinants of Health: Patient lives independently with his wife  Disposition: TBD  Co morbidities that complicate the patient evaluation  Past Medical History:  Diagnosis Date   Antiphospholipid antibody positive    Antiphospholipid antibody syndrome (HCC)    CAD (coronary artery disease)    CAD S/P percutaneous coronary angioplasty '98, '04, Feb 2017   [facility]   Carotid arterial disease ([place]) 10/2015   moderate bilateral   COPD (chronic obstructive pulmonary disease) (HCC)    Coronary artery disease    DM (diabetes mellitus) ([place])    HTN (hypertension)    Hypertension    Non-insulin treated type 2 diabetes mellitus (HCC)    OSA (obstructive sleep apnea)    Pulmonary emboli ([place]) 2011   RBBB    Sleep apnea    Vertebrobasilar artery insufficiency      Medicines Meds ordered  this encounter  Medications   acetaminophen (TYLENOL) tablet 1,000 mg   warfarin (COUMADIN) tablet 5 mg   topiramate (TOPAMAX) tablet 75 mg   metoprolol tartrate (LOPRESSOR) tablet 12.5 mg   pantoprazole (PROTONIX) EC tablet 40 mg   glimepiride (AMARYL) tablet 4 mg   DULoxetine (CYMBALTA) DR capsule 30 mg   Warfarin - Physician Dosing Inpatient    I have reviewed the patients home medicines and have made adjustments as needed  Problem List / ED Course: Problem List Items Addressed This Visit   None Visit Diagnoses     Involuntary commitment    -  Primary                   This note was created using dictation software, which may contain spelling or grammatical errors.    Audley Hose, MD 06/03/22 [phone]

## 2022-06-03 NOTE — ED Triage Notes (Addendum)
Pt BIB EMS from home as he was IVCd and requires continuous O2 at 4L [place].  Pt has no complaints. Is at his baseline medically.   Pt denies SI/HI

## 2022-06-04 DIAGNOSIS — Z630 Problems in relationship with spouse or partner: Secondary | ICD-10-CM

## 2022-06-04 DIAGNOSIS — R45851 Suicidal ideations: Secondary | ICD-10-CM

## 2022-06-04 LAB — BASIC METABOLIC PANEL
Anion gap: 9 (ref 5–15)
BUN: 22 mg/dL (ref 8–23)
CO2: 26 mmol/L (ref 22–32)
Calcium: 8.7 mg/dL — ABNORMAL LOW (ref 8.9–10.3)
Chloride: 101 mmol/L (ref 98–111)
Creatinine, Ser: 0.97 mg/dL (ref 0.61–1.24)
GFR, Estimated: >60 mL/min (ref >60)
Glucose, Bld: 164 mg/dL — ABNORMAL HIGH (ref 70–99)
Potassium: 3.6 mmol/L (ref 3.5–5.1)
Sodium: 136 mmol/L (ref 135–145)

## 2022-06-04 LAB — CBC WITH DIFFERENTIAL/PLATELET
Abs Immature Granulocytes: 0.06 10*3/uL (ref 0.00–0.07)
Basophils Absolute: 0.1 10*3/uL (ref 0.0–0.1)
Basophils Relative: 1 %
Eosinophils Absolute: 0.2 10*3/uL (ref 0.0–0.5)
Eosinophils Relative: 2 %
HCT: 33.2 % — ABNORMAL LOW (ref 39.0–52.0)
Hemoglobin: 9.8 g/dL — ABNORMAL LOW (ref 13.0–17.0)
Immature Granulocytes: 1 %
Lymphocytes Relative: 22 %
Lymphs Abs: 2.8 10*3/uL (ref 0.7–4.0)
MCH: 25.5 pg — ABNORMAL LOW (ref 26.0–34.0)
MCHC: 29.5 g/dL — ABNORMAL LOW (ref 30.0–36.0)
MCV: 86.2 fL (ref 80.0–100.0)
Monocytes Absolute: 1 10*3/uL (ref 0.1–1.0)
Monocytes Relative: 8 %
Neutro Abs: 8.4 10*3/uL — ABNORMAL HIGH (ref 1.7–7.7)
Neutrophils Relative %: 66 %
Platelets: 337 10*3/uL (ref 150–400)
RBC: 3.85 MIL/uL — ABNORMAL LOW (ref 4.22–5.81)
RDW: 16.8 % — ABNORMAL HIGH (ref 11.5–15.5)
WBC: 12.6 10*3/uL — ABNORMAL HIGH (ref 4.0–10.5)
nRBC: 0.2 % (ref 0.0–0.2)

## 2022-06-04 LAB — PROTIME-INR
INR: 2.2 — ABNORMAL HIGH (ref 0.8–1.2)
Prothrombin Time: 23.9 seconds — ABNORMAL HIGH (ref 11.4–15.2)

## 2022-06-04 MED ORDER — IPRATROPIUM-ALBUTEROL 0.5-2.5 (3) MG/3ML IN SOLN
3.0000 mL | RESPIRATORY_TRACT | Status: DC | PRN
  Administered 2022-06-04 – 2022-06-05 (×2): 3 mL via RESPIRATORY_TRACT
  Filled 2022-06-04 (×2): qty 3

## 2022-06-04 NOTE — ED Notes (Signed)
Multiple attempts made to contact the wife of pt regarding getting access to the house with no success. Daughter of pt was called, she does not have a way to access the house. Daughter also called wife of pt to assist getting access to house but was told she did not have a key either. At this time there is no way for pt to access inside of house if he is sent home.

## 2022-06-04 NOTE — Discharge Instructions (Signed)

## 2022-06-04 NOTE — BH Assessment (Addendum)
Comprehensive Clinical Assessment (CCA) Note  06/04/2022 Jose [name] [id number] Disposition: Clinician discussed patient care with Erasmo Score, NP.  She recommends patient be seen by psychiatry when they do rounds today 03/21 and are able to review IVC.  Clinician informed RN Candace of the disposition recommendation via secure messaging.  Patient has normal eye contact and is oriented x4.  Patient is not responding to internal stimuli.  He does not display delusional thought process.  He is clear and coherent in his statements although he does get into irrelevancies.  Patient has fair judgement.  He is wheelchair bound and is on oxygen.  Patient does not have any outpatient psychiatric care.     Chief Complaint: No chief complaint on file.  Visit Diagnosis: MDD recurrent, moderate    CCA Screening, Triage and Referral (STR)  Patient Reported Information How did you hear about [place]? Legal System (Pt was brought to [facility] on IVC.)  What Is the Reason for Your Visit/Call Today? Pt brought to [facility] on IVC. The paperwork alleges that patient made a statement about taking a bunch of sleeping pills so he can die.  Pt denies that he has said this.  He said he wants to stay alive because he has grandchildren.  Pt says that his wife has made threats to kill him.  Pt says that when she is angry she will call him names.  Patient is alleged to hallucinate and talk to people that ae not there.  Patient said that he will talk when he dreams.  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  Patient admits to being forgetful and that his articulation is not what it used to be.  Patient denies any HI.  He denies any previous suicide attempts.  Patient denie suse of ETOH or other substances.  Pt says that there is only a BB pistol in the home.  Pt has problems with sleeping due to sleep apnea.  He says he eats like a pig.  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  He says "I do take the  oxygen off when I smoke."  Paient has no outpatint mental health care.  Petitioner did not answer phone when called.  How Long Has This Been Causing You Problems? > than 6 months  What Do You Feel Would Help You the Most Today? Treatment for Depression or other mood problem   Have You Recently Had Any Thoughts About Hurting Yourself? No (Pt denies)  Are You Planning to Commit Suicide/Harm Yourself At This time? No (Pt denies)   Flowsheet Row ED from 06/03/2022 in [facility] Emergency Department at [facility] ED from 02/20/2022 in [facility] Emergency Department at [facility] ED to Hosp-Admission (Discharged) from 02/12/2022 in [facility] 4E CV SURGICAL PROGRESSIVE CARE  C-SSRS RISK CATEGORY No Risk No Risk No Risk       Have you Recently Had Thoughts About [place]? No  Are You Planning to Harm Someone at This Time? No  Explanation: IVC papers allege that patient has made SI statments which he denies.   Have You Used Any Alcohol or Drugs in the Past 24 Hours? No  What Did You Use and How Much? Pt denies use of ETOH and other substances.   Do You Currently Have a Therapist/Psychiatrist? No  Name of Therapist/Psychiatrist: Name of Therapist/Psychiatrist: None   Have You Been Recently Discharged From Any Office Practice or Programs? No  Explanation of Discharge From Practice/Program: N/A     CCA Screening Triage Referral Assessment Type of Contact: Tele-Assessment  Telemedicine Service Delivery:   Is this Initial or Reassessment? Is this Initial or Reassessment?: Initial Assessment  Date Telepsych consult ordered in [facility]:  Date Telepsych consult ordered in [facility]: 06/03/22  Time Telepsych consult ordered in [facility]:  Time Telepsych consult ordered in [facility]: 1839  Location of Assessment: [facility] ED  Provider Location: [facility] Assessment Services   Collateral Involvement: Petitioner phone was called but there was no answer.   Does Patient Have a Editor, commissioning Guardian? No  Legal Guardian Contact Information: No legal guardian  Copy of Legal Guardianship Form: -- (No legal guardian)  Legal Guardian Notified of Arrival: -- (No legal guardian)  Legal Guardian Notified of Pending Discharge: -- (No legal guardian)  If Minor and Not Living with Parent(s), Who has Custody? Pt is an adult  Is CPS involved or ever been involved? Never  Is APS involved or ever been involved? Never   Patient Determined To Be At Risk for Harm To Self or Others Based on Review of Patient Reported Information or Presenting Complaint? Yes, for Self-Harm (IVC papers allege pt has threatened to kill himself.  He denies this.)  Method: -- (IVC says he threatened to overdose on pills.)  Availability of Means: -- (Pt denies making suicidal threats.)  Intent: -- (Pt denies intent to kill himself.)  Notification Required: -- (No notification required.)  Additional Information for Danger to Others Potential: -- (no HI)  Additional Comments for Danger to Others Potential: pt denies any HI.  Are There Guns or Other Weapons in [place]? No (BB pistol)  Types of Guns/Weapons: Pt says he has a BB pistol  Are These Weapons Safely Secured?                            Yes  Who Could Verify You Are Able To Have These Secured: spoue  Do You Have any Outstanding Charges, Pending Court Dates, Parole/Probation? None  Contacted To Inform of Risk of Harm To Self or Others: Other: Comment (Concerned parties are aware.)    Does Patient Present under Involuntary Commitment? Yes    [place] of Residence: [place]   Patient Currently Receiving the Following Services: Not Receiving Services   Determination of Need: Urgent (48 hours)   Options For Referral: Other: Comment (Pt to be seen by provider on 03/21)     CCA Biopsychosocial Patient Reported Schizophrenia/Schizoaffective Diagnosis in Past: No   Strengths: Pt is talkative and advocates for  himself.   Mental Health Symptoms Depression:   Change in energy/activity; Hopelessness; Fatigue; Sleep (too much or little)   Duration of Depressive symptoms:  Duration of Depressive Symptoms: Greater than two weeks   Mania:   None   Anxiety:    Worrying; Irritability   Psychosis:   None   Duration of Psychotic symptoms:    Trauma:   None   Obsessions:   None   Compulsions:   None   Inattention:   None   Hyperactivity/Impulsivity:   None   Oppositional/Defiant Behaviors:   N/A   Emotional Irregularity:   Mood lability   Other Mood/Personality Symptoms:   N/A    Mental Status Exam Appearance and self-care  Stature:   Average   Weight:   Overweight   Clothing:   Disheveled   Grooming:   Neglected   Cosmetic  use:   None   Posture/gait:   Other (Comment) (pt is wheelchair bound)   Motor activity:   Not Remarkable   Sensorium  Attention:   Distractible   Concentration:   Focuses on irrelevancies   Orientation:   X5   Recall/memory:   Defective in Recent   Affect and Mood  Affect:   Depressed   Mood:   Depressed   Relating  Eye contact:   Normal   Facial expression:   Responsive   Attitude toward examiner:   Cooperative   Thought and Language  Speech flow:  Clear and Coherent   Thought content:   Appropriate to Mood and Circumstances   Preoccupation:   Somatic   Hallucinations:   None   Organization:   Loose; Programmer, systems of Knowledge:   Average   Intelligence:   Average   Abstraction:   Functional   Judgement:   Fair   Art therapist:   Realistic   Insight:   Good   Decision Making:   Normal   Social Functioning  Social Maturity:   Responsible   Social Judgement:   Normal   Stress  Stressors:   Family conflict; Illness; Relationship   Coping Ability:   Deficient supports   Skill Deficits:   Activities of daily living; Self-care   Supports:    Support needed     Religion: Religion/Spirituality Are You A Religious Person?: No How Might This Affect Treatment?: None  Leisure/Recreation: Leisure / Recreation Do You Have Hobbies?: No  Exercise/Diet: Exercise/Diet Do You Exercise?: No Have You Gained or Lost A Significant Amount of Weight in the Past Six Months?:  (Pt does not know how much) Do You Follow a Special Diet?: No Do You Have Any Trouble Sleeping?: Yes Explanation of Sleeping Difficulties: COPD, Sleep apnea   CCA Employment/Education Employment/Work Situation: Employment / Work Situation Employment Situation: Retired Social research officer, government has Been Impacted by Current Illness: No Has Patient ever Been in Passenger transport manager?: No  Education: Education Is Patient Currently Attending School?: No Last Grade Completed: 12 Did You Nutritional therapist?: No Did You Have An Individualized Education Program (IIEP): No Did You Have Any Difficulty At [employer]?: No Patient's Education Has Been Impacted by Current Illness: No   CCA Family/Childhood History Family and Relationship History: Family history Marital status: Married Number of Years Married: 7 What types of issues is patient dealing with in the relationship?: His health is poor.  Wife is not helpful he says. Additional relationship information: Pt says that he and wife do argue. Does patient have children?: Yes How many children?: 4 How is patient's relationship with their children?: Two daughters and a 62 year old son.  Pt has a daughter that passed away.  Childhood History:  Childhood History By whom was/is the patient raised?: Both parents Did patient suffer any verbal/emotional/physical/sexual abuse as a child?: No Did patient suffer from severe childhood neglect?: No Has patient ever been sexually abused/assaulted/raped as an adolescent or adult?: No Was the patient ever a victim of a crime or a disaster?: No Witnessed domestic violence?: No Has patient been  affected by domestic violence as an adult?: No       CCA Substance Use Alcohol/Drug Use: Alcohol / Drug Use Pain Medications: See MAR Prescriptions: See MAR Over the Counter: See MAR History of alcohol / drug use?: No history of alcohol / drug abuse  ASAM's:  Six Dimensions of Multidimensional Assessment  Dimension 1:  Acute Intoxication and/or Withdrawal Potential:      Dimension 2:  Biomedical Conditions and Complications:      Dimension 3:  Emotional, Behavioral, or Cognitive Conditions and Complications:     Dimension 4:  Readiness to Change:     Dimension 5:  Relapse, Continued use, or Continued Problem Potential:     Dimension 6:  Recovery/Living Environment:     ASAM Severity Score:    ASAM Recommended Level of Treatment:     Substance use Disorder (SUD)    Recommendations for Services/Supports/Treatments:    Discharge Disposition:    DSM5 Diagnoses: Patient Active Problem List   Diagnosis Date Noted   Chronic pain syndrome 04/23/2022   Encephalopathy acute 02/15/2022   Acute respiratory failure with hypoxia and hypercapnia (HCC) 02/15/2022   Syncope 02/15/2022   Muscular deconditioning 02/14/2022   Asymptomatic bilateral carotid artery stenosis 02/14/2022   Traumatic subarachnoid hemorrhage ([place]) 02/12/2022   T2DM (type 2 diabetes mellitus) ([place]) 02/06/2022   (HFpEF) heart failure with preserved ejection fraction ([place]) 02/06/2022   OSA (obstructive sleep apnea) 02/06/2022   CAD (coronary artery disease) 02/06/2022   Hx of CABG 02/06/2022   PAD (peripheral artery disease) ([place]) 02/06/2022   Carotid artery stenosis 02/06/2022   Moderate aortic stenosis 02/06/2022   History of maternal pulmonary embolus 02/06/2022   Essential hypertension 02/06/2022   Tobacco abuse 02/06/2022   Obesity (BMI 30-39.9) 02/06/2022   Cardiac syncope 02/06/2022   COPD with acute exacerbation ([place]) 02/04/2022   On mechanically assisted  ventilation ([place]) 02/03/2022   Acute respiratory failure with hypoxia ([place]) 02/03/2022   Altered mental status 02/02/2022   Fall 02/02/2022   Chronic respiratory failure with hypoxia and hypercapnia (HCC) 02/02/2022   APS (antiphospholipid syndrome) ([place]) 01/23/2022   Traumatic hematoma of buttock 01/22/2022   Chronic respiratory failure with hypoxia ([place]) 01/22/2022   ABLA (acute blood loss anemia) 01/22/2022   Recurrent syncope 01/22/2022   Morbid obesity ([place]) 12/19/2021   Memory impairment 12/18/2021   COPD with acute exacerbation ([place]) 11/06/2021   Acute on chronic respiratory failure with hypercapnia ([place]) 11/05/2021   AKI (acute kidney injury) ([place]) 11/05/2021   Obesity (BMI 30-39.9)    Benign prostatic hyperplasia without lower urinary tract symptoms    COPD exacerbation ([place]) 07/13/2020   Prolonged QT interval 07/13/2020   Acute on chronic respiratory failure with hypoxia and hypercapnia (HCC) 07/13/2020   Chronic diastolic CHF (congestive heart failure) ([place]) 07/13/2020   Left thyroid nodule 01/25/2020   Community acquired pneumonia 01/18/2020   Coronary atherosclerosis 10/04/2017   GERD (gastroesophageal reflux disease) 10/04/2017   Essential hypertension 10/04/2017   Transient visual loss 10/04/2017   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   Nonrheumatic aortic valve stenosis 03/31/2017   Pericarditis, Acute 02/12/2017   Acute on chronic right heart failure ([place]) 02/11/2017   Angina at rest 08/17/2016   Unstable angina ([place]) 08/16/2016   Tobacco abuse 08/16/2016   Antiphospholipid syndrome ([place]) 08/16/2016   Hyperlipidemia 08/16/2016   Essential hypertension, benign 08/16/2016   COPD, severe ([place]) 08/16/2016   Sleep apnea 08/16/2016   CAD S/P multiple PCIs 08/16/2016   History of pulmonary embolus (PE)-2011 08/16/2016   Chronic anticoagulation-[place] 08/16/2016   Type 2 diabetes mellitus with hyperlipidemia ([place]) 08/16/2016   Carotid artery disease ([place]) 08/16/2016   RBBB  (right bundle branch block) 08/16/2016   Dyspnea on exertion 10/17/2015   ED (erectile dysfunction) 08/09/2012  Anemia 09/12/2011     Referrals to Alternative Service(s): Referred to Alternative Service(s):   Place:   Date:   Time:    Referred to Alternative Service(s):   Place:   Date:   Time:    Referred to Alternative Service(s):   Place:   Date:   Time:    Referred to Alternative Service(s):   Place:   Date:   Time:     [place] Session

## 2022-06-04 NOTE — Consult Note (Signed)
[facility] ED ASSESSMENT   Reason for Consult: IVC'd for SI Referring Physician: Dr. Mayra Neer Patient Identification: Jose [name] MRN:  [id number] ED Chief Complaint: Suicidal ideation  Diagnosis:  Principal Problem:   Suicidal ideation Active Problems:   Marital conflict   ED Assessment Time Calculation: Start Time: 0945 Stop Time: 1030 Total Time in Minutes (Assessment Completion): 45   HPI:  Per Triage Note: Pt BIB EMS from home as he was IVCd and requires continuous O2 at 4L [place].  Pt has no complaints. Is at his baseline medically.  Pt denies SI/HI.    Subjective:  Jose [name], 66 y.o., male patient seen face to face by this provider, consulted with Dr. Dwyane Dee; and chart reviewed on 06/04/22.  On evaluation Jose [name] reports that he has a lot of medical issues, such as stage IV COPD, syncope, heart issues, and dysautonomia. Patient permanently wears oxygen on 4 L.  Patient states that he has been married to his wife for 10 years, they had an argument where he led to him being admitted to Jose [name], ED.  Patient states that he and his wife have a volatile, toxic relationship, his IVC pegboard says that he voiced SI by overdose on pills, patient states he did not say that, states that his wife is tired of being his full-time caregiver and is stressed.  Patient denies SI/HI/AVH, he states that sometimes his dysautonomia, sometimes makes him have vivid dreams where he may talk in his sleep, and his wife thinks he is hallucinating.  Patient states his appetite is good, but his sleep is poor due to medical issues and anxiety.  Patient states the only thing that makes him depressed is that he cannot find a neurologist to help him with his syncope episodes, patient states he is in pain 24 hours a day. Patient states he has no past psychiatric history, takes no psychiatric medications.   During evaluation Jose [name] is sitting up in hospital bed in no acute distress.  He is alert, oriented x 4,  calm, cooperative and attentive.  His mood is euthymic/, home with congruent affect. He has normal speech, and behavior.  Objectively there is no evidence of psychosis/mania or delusional thinking.  Patient is able to converse coherently, goal directed thoughts, no distractibility, or pre-occupation.  He denies suicidal/self-harm/homicidal ideation, psychosis, and paranoia.    Spoke with patient's daughter Jose [name], she states that she is patient's medical power of attorney, she states that he and his wife do have a volatile relationship.  States that patient is a hard person to live with, he is on his fourth wife, and he can only live with his daughter Jose [name]) for a couple of days due to their relationship history.  Jose [name] states that she spoke with patient's wife Jose [name] and that Jose [name] will be going to DSS to get a 30-day eviction notice for patient.  She states that Gaye cannot take care of patient anymore, and that patient needs full time care, due to his medical issues.  At this time Jose [name] is educated and verbalizes understanding of mental health resources and other crisis services in the community. He is instructed to call 911 and present to the nearest emergency room should  he experience any suicidal/homicidal ideation, auditory/visual/hallucinations, or detrimental worsening of his mental health condition.    Past Psychiatric History: None per patient    Risk to Self or Others: Is the patient at risk to self? Patient denies  Has the patient been  a risk to self in the past 6 months? Patient denies  Has the patient been a risk to self within the distant past? Patient denies  Is the patient a risk to others? Patient denies  Has the patient been a risk to others in the past 6 months? Patient denies  Has the patient been a risk to others within the distant past? Patient denies   [place]:  [facility] ED from 06/03/2022 in [facility] Emergency Department at [facility] ED from 02/20/2022 in [facility] Emergency Department at [facility] ED to Hosp-Admission (Discharged) from 02/12/2022 in [facility] 4E CV SURGICAL PROGRESSIVE CARE  C-SSRS RISK CATEGORY Error: Q6 is Yes, you must answer 7 No Risk No Risk       AIMS:  , , ,  ,   ASAM:    Substance Abuse:  Alcohol / Drug Use Pain Medications: See [facility] Prescriptions: See MAR Over the Counter: See MAR History of alcohol / drug use?: No history of alcohol / drug abuse  Past Medical History:  Past Medical History:  Diagnosis Date   Antiphospholipid antibody positive    Antiphospholipid antibody syndrome (HCC)    CAD (coronary artery disease)    CAD S/P percutaneous coronary angioplasty '98, '04, Feb 2017   [facility]   Carotid arterial disease ([place]) 10/2015   moderate bilateral   COPD (chronic obstructive pulmonary disease) ([place])    Coronary artery disease    DM (diabetes mellitus) ([place])    HTN (hypertension)    Hypertension    Non-insulin treated type 2 diabetes mellitus ([place])    OSA (obstructive sleep apnea)    Pulmonary emboli ([place]) 2011   RBBB    Sleep apnea    Vertebrobasilar artery insufficiency     Past Surgical History:  Procedure Laterality Date   APPENDECTOMY     APPLICATION OF WOUND VAC Left 03/18/2019   Procedure: Application Of Wound Vac;  Surgeon: Newt Minion, MD;  Location: [facility];  Service: Orthopedics;  Laterality: Left;   BACK SURGERY     BACK SURGERY  '89, '06   COLONOSCOPY     CORONARY ANGIOPLASTY WITH STENT PLACEMENT     CORONARY ANGIOPLASTY WITH STENT PLACEMENT  '98, '04, Feb 2017   I & D EXTREMITY Left 03/15/2019   Procedure: DEBRIDEMENT LEFT THIGH;  Surgeon: Newt Minion, MD;  Location: [facility];  Service: Orthopedics;  Laterality: Left;   INCISION AND DRAINAGE OF WOUND Left 03/18/2019   Procedure: LEFT THIGH DEBRIDEMENT;  Surgeon: Newt Minion, MD;  Location: [facility];  Service: Orthopedics;  Laterality: Left;   RIGHT/LEFT HEART CATH AND CORONARY/GRAFT  ANGIOGRAPHY N/A 02/13/2022   Procedure: RIGHT/LEFT HEART CATH AND CORONARY/GRAFT ANGIOGRAPHY;  Surgeon: Adrian Prows, MD;  Location: [facility] CV LAB;  Service: Cardiovascular;  Laterality: N/A;   Family History:  Family History  Problem Relation Age of Onset   CAD Father 46    Social History:  Social History   Substance and Sexual Activity  Alcohol Use No     Social History   Substance and Sexual Activity  Drug Use No    Social History   Socioeconomic History   Marital status: Married    Spouse name: Not on file   Number of children: Not on file   Years of education: Not on file   Highest education level: Not on file  Occupational History   Not on file  Tobacco Use   Smoking status:  Former    Packs/day: 0.50    Years: 40.00    Additional pack years: 0.00    Total pack years: 20.00    Types: Cigarettes    Quit date: 01/15/2019    Years since quitting: 3.3   Smokeless tobacco: Never   Tobacco comments:    has started smoking again, willing to quit.  Vaping Use   Vaping Use: Never used  Substance and Sexual Activity   Alcohol use: No   Drug use: No   Sexual activity: Yes  Other Topics Concern   Not on file  Social History Narrative   ** Merged History Encounter **       Social Determinants of Health   Financial Resource Strain: Not on file  Food Insecurity: Not on file  Transportation Needs: Not on file  Physical Activity: Not on file  Stress: Not on file  Social Connections: Not on file     Allergies:   Allergies  Allergen Reactions   Melatonin Anxiety   Altace [Ramipril] Cough    Labs:  Results for orders placed or performed during the hospital encounter of 06/03/22 (from the past 48 hour(s))  Protime-INR     Status: Abnormal   Collection Time: 06/04/22  1:41 AM  Result Value Ref Range   Prothrombin Time 23.9 (H) 11.4 - 15.2 seconds   INR 2.2 (H) 0.8 - 1.2    Comment: (NOTE) INR goal varies based on device and disease states. Performed at  [facility], [place] [address], [place], [place] [address]   CBC with Differential     Status: Abnormal   Collection Time: 06/04/22  1:41 AM  Result Value Ref Range   WBC 12.6 (H) 4.0 - 10.5 K/uL   RBC 3.85 (L) 4.22 - 5.81 MIL/uL   Hemoglobin 9.8 (L) 13.0 - 17.0 g/dL   HCT 33.2 (L) 39.0 - 52.0 %   MCV 86.2 80.0 - 100.0 fL   MCH 25.5 (L) 26.0 - 34.0 pg   MCHC 29.5 (L) 30.0 - 36.0 g/dL   RDW 16.8 (H) 11.5 - 15.5 %   Platelets 337 150 - 400 K/uL   nRBC 0.2 0.0 - 0.2 %   Neutrophils Relative % 66 %   Neutro Abs 8.4 (H) 1.7 - 7.7 K/uL   Lymphocytes Relative 22 %   Lymphs Abs 2.8 0.7 - 4.0 K/uL   Monocytes Relative 8 %   Monocytes Absolute 1.0 0.1 - 1.0 K/uL   Eosinophils Relative 2 %   Eosinophils Absolute 0.2 0.0 - 0.5 K/uL   Basophils Relative 1 %   Basophils Absolute 0.1 0.0 - 0.1 K/uL   Immature Granulocytes 1 %   Abs Immature Granulocytes 0.06 0.00 - 0.07 K/uL    Comment: Performed at [facility], [place] [address] [place], [place] [id number]  Basic metabolic panel     Status: Abnormal   Collection Time: 06/04/22  1:41 AM  Result Value Ref Range   Sodium 136 135 - 145 mmol/L   Potassium 3.6 3.5 - 5.1 mmol/L   Chloride 101 98 - 111 mmol/L   CO2 26 22 - 32 mmol/L   Glucose, Bld 164 (H) 70 - 99 mg/dL    Comment: Glucose reference range applies only to samples taken after fasting for at least 8 hours.   BUN 22 8 - 23 mg/dL   Creatinine, Ser 0.97 0.61 - 1.24 mg/dL   Calcium 8.7 (L) 8.9 - 10.3 mg/dL  GFR, Estimated >60 >60 mL/min    Comment: (NOTE) Calculated using the CKD-EPI Creatinine Equation (2021)    Anion gap 9 5 - 15    Comment: Performed at [facility], [place] [address], [place], [place] [address]    Current Facility-Administered Medications  Medication Dose Route Frequency Provider Last Rate Last Admin   acetaminophen (TYLENOL) tablet 1,000 mg  1,000 mg Oral Q8H PRN Audley Hose, MD       DULoxetine  (CYMBALTA) DR capsule 30 mg  30 mg Oral Daily Audley Hose, MD   30 mg at 06/04/22 0921   glimepiride (AMARYL) tablet 4 mg  4 mg Oral Q breakfast Audley Hose, MD   4 mg at 06/04/22 0837   ipratropium-albuterol (DUONEB) 0.5-2.5 (3) MG/3ML nebulizer solution 3 mL  3 mL Nebulization Q4H PRN Curatolo, Adam, DO   3 mL at 06/04/22 0924   metoprolol tartrate (LOPRESSOR) tablet 12.5 mg  12.5 mg Oral BID Audley Hose, MD       pantoprazole (PROTONIX) EC tablet 40 mg  40 mg Oral Daily Audley Hose, MD   40 mg at 06/04/22 0921   topiramate (TOPAMAX) tablet 75 mg  75 mg Oral BID Audley Hose, MD   75 mg at 06/04/22 1191   warfarin (COUMADIN) tablet 5 mg  5 mg Oral QHS Audley Hose, MD   5 mg at 06/04/22 0130   Warfarin - Physician Dosing Inpatient   Does not apply q1600 Audley Hose, MD       Current Outpatient Medications  Medication Sig Dispense Refill   acetaminophen (TYLENOL) 500 MG tablet Take 1,000 mg by mouth every 6 (six) hours as needed for mild pain.     albuterol (VENTOLIN HFA) 108 (90 Base) MCG/ACT inhaler Inhale 2 puffs into the lungs every 6 (six) hours as needed for wheezing or shortness of breath.     DULoxetine (CYMBALTA) 60 MG capsule Take 60 mg by mouth at bedtime. Take one capsule (60 mg) along with 30 mg capsule to equal 90 mg daily     DULoxetine HCl 30 MG CSDR Take 30 mg by mouth at bedtime.     Fluticasone-Umeclidin-Vilant (TRELEGY ELLIPTA IN) Inhale 1 puff into the lungs daily.     gabapentin (NEURONTIN) 600 MG tablet Take 600 mg by mouth 2 (two) times daily.     glimepiride (AMARYL) 4 MG tablet Take 1 tablet (4 mg total) by mouth daily with breakfast. (Patient taking differently: Take 4 mg by mouth 2 (two) times daily.)     HYDROcodone-acetaminophen (NORCO/VICODIN) 5-325 MG tablet Take 1 tablet by mouth daily as needed for moderate pain.     nitroGLYCERIN (NITROSTAT) 0.4 MG SL tablet Place 0.4 mg under the tongue every 5 (five) minutes as needed for chest pain.    1   pantoprazole (PROTONIX) 40 MG tablet Take 40 mg by mouth 2 (two) times daily.     rosuvastatin (CRESTOR) 10 MG tablet Take 10 mg by mouth every evening.  11   tamsulosin (FLOMAX) 0.4 MG CAPS capsule Take 1 capsule (0.4 mg total) by mouth at bedtime. (Patient taking differently: Take 0.8 mg by mouth daily.) 30 capsule 0   topiramate (TOPAMAX) 50 MG tablet Take 1.5 tablets (75 mg total) by mouth 2 (two) times daily. (Patient taking differently: Take 75 mg by mouth 3 (three) times daily.) 45 tablet 1   torsemide (DEMADEX) 20 MG tablet Take 20 mg by mouth  daily.     warfarin (COUMADIN) 5 MG tablet Take 5 mg by mouth every evening.     albuterol (PROVENTIL) (5 MG/ML) 0.5% nebulizer solution Take 0.5 mLs (2.5 mg total) by nebulization every 6 (six) hours as needed for wheezing or shortness of breath. (Patient not taking: Reported on 06/04/2022) 20 mL 12   metoprolol tartrate (LOPRESSOR) 25 MG tablet Take 0.5 tablets (12.5 mg total) by mouth 2 (two) times daily. (Patient not taking: Reported on 04/01/2022) 30 tablet 0   OXYGEN Inhale 4-6 L into the lungs daily.      Musculoskeletal:  Patient observed resting in bed.   Psychiatric Specialty Exam: Presentation  General Appearance:  Appropriate for Environment  Eye Contact: Good  Speech: Clear and Coherent  Speech Volume: Normal  Handedness: Right   Mood and Affect  Mood: Euthymic  Affect: Appropriate   Thought Process  Thought Processes: Coherent  Descriptions of Associations:Intact  Orientation:Full (Time, Place and Person)  Thought Content:WDL  History of Schizophrenia/Schizoaffective disorder:No  Duration of Psychotic Symptoms:No data recorded Hallucinations:Hallucinations: None  Ideas of Reference:None  Suicidal Thoughts:Suicidal Thoughts: No  Homicidal Thoughts:Homicidal Thoughts: No   Sensorium  Memory: Immediate Fair; Remote Fair  Judgment: Good  Insight: Good   Executive Functions   Concentration: Good  Attention Span: Good  Recall: Good  Fund of Knowledge: Good  Language: Good   Psychomotor Activity  Psychomotor Activity: Psychomotor Activity: Normal   Assets  Assets: Communication Skills; Social Support    Sleep  Sleep: Sleep: Fair   Physical Exam: Physical Exam Eyes:     Pupils: Pupils are equal, round, and reactive to light.  Cardiovascular:     Rate and Rhythm: Normal rate.  Pulmonary:     Effort: Pulmonary effort is normal.  Musculoskeletal:     Cervical back: Normal range of motion.  Neurological:     Mental Status: He is alert.  Psychiatric:        Mood and Affect: Mood normal.        Behavior: Behavior normal.        Thought Content: Thought content normal.        Judgment: Judgment normal.    Review of Systems  Constitutional: Negative.   HENT: Negative.    Respiratory: Negative.    Musculoskeletal: Negative.   Psychiatric/Behavioral:  Positive for depression.    Blood pressure 136/81, pulse 91, temperature 98.1 F (36.7 C), temperature source Oral, resp. rate (!) 23, height 5\' 8"  (1.727 m), weight 111.1 kg, SpO2 98 %. Body mass index is 37.25 kg/m.   Medical Decision Making: Patient case review and discussed with Dr. Dwyane Dee, and patient does not meet inpatient criteria for inpatient psychiatric treatment.    Disposition: Patient does not meet criteria for psychiatric inpatient admission. Supportive therapy provided about ongoing stressors. Discussed crisis plan, support from social network, calling 911, coming to the Emergency Department, and calling Suicide Hotline.    Michaele Offer, PMHNP 06/04/2022 2:52 PM

## 2022-06-04 NOTE — Progress Notes (Signed)
Transition of Care [facility]) - Emergency Department Mini Assessment   Patient Details  Name: Jose [name] MRN: [id number] Date of Birth: 05-Aug-1955  Transition of Care [facility]) CM/SW Contact:    Rodney Booze, LCSW Phone Number: 06/04/2022, 6:19 PM   Clinical Narrative: Jose [name] to this patient is that he needs continuous o2 and if PTAR comes and get the patient and brings him to the home he can not get in. This can be a safety issue. The patient does not have a key and the wife has stated that he can not come to her home. The wife also stated she would call the cops if the patient came and have him removed again. Please see note below.   CSW contacted the daughter who lives in [place], the daughter stated that she can not care for the dad and that his wife was no longer going to deal with the father. This CSW has reached out to the wife in regards to the patient. The patient reports putting a restraining order on the patient, the wife also reported that she has a 30 day eviction in the works as well as separation papers. The wife reported to this CSW that she will not take him back, she also reported that his name is not on the housing paper work.   The wife stated that she also is not in [place] at this time she is in [facility]. The wife reported that she no longer will care for the patient. The wife was very argumentative with this CSW in regards to being able to help find a safe place for this patient.   This CSW then went and spoke to the patient at the bed side to explain the situation. The patient got very frustrated with this CSW due to the fact that the patient is ready to go home. The CSW asked the patient if he had a key and he said no but his wife is home and that she is a Museum/gallery conservator. This CSW asked the patient to call his wife to let her know that he was coming home in which he did she told him she was not there.   The wife told the patient that he is not allowed to come back and he  needed to find someone where to go. This CSW asked the patient about his finances the patient reports that he get 2200 a month. The patient also stated that he does not qualify for medicaid as he stated he has tried. The patient is very adamant about not going to ALF orr SNF. The patient can leave however with TOC not knowing what will happen once he gets to the home if he can get in.The patient does not walk well and is on continuous o2 at this time  This CSW gave the wife a call back after speaking with the patient, the wife stated that he would be breaking and entering and he can not come back to her house and to not reach out to her reach out to his daughter. The wife wants nothing to do the patient and is refusing his return to the home. Please contact daughter as she is the only one in the chart. The daughter is his daughter not the wife's.   This CSW received the number from the daughter. The nurse reports that he received the wife's number from the patient. The wife number is noted as [phone] at this time Leadership was made aware as well as  providers. There has been a PT order in however the patient is refusing due to the patient wanting to return back to the home.    ED Mini Assessment: What brought you to the Emergency Department? : [place] was IVC'd and brought by sheriff  Barriers to Discharge: No Barriers Identified  Barrier interventions: Patient does not have a key, patient is wanting to go home, Wife states that she has a restraining order.  Means of departure: Ambulance  Interventions which prevented an admission or readmission: SNF Placement    Patient Contact and Communications     Spoke with: Wife and daughter [phone] Contact Date: 06/04/22,   Contact time: 1817   Call outcome: Wife said he could not come home.  Patient states their goals for this hospitalization and ongoing recovery are:: Patient would like to go home Patient is not wanting to be here maybe  we need to do an intervention/ family meeting.      Admission diagnosis:  IVC Patient Active Problem List   Diagnosis Date Noted   Marital conflict [id number]   Suicidal ideation 06/04/2022   Chronic pain syndrome 04/23/2022   Encephalopathy acute 02/15/2022   Acute respiratory failure with hypoxia and hypercapnia (HCC) 02/15/2022   Syncope 02/15/2022   Muscular deconditioning 02/14/2022   Asymptomatic bilateral carotid artery stenosis 02/14/2022   Traumatic subarachnoid hemorrhage ([place]) 02/12/2022   T2DM (type 2 diabetes mellitus) ([place]) 02/06/2022   (HFpEF) heart failure with preserved ejection fraction ([place]) 02/06/2022   OSA (obstructive sleep apnea) 02/06/2022   CAD (coronary artery disease) 02/06/2022   Hx of CABG 02/06/2022   PAD (peripheral artery disease) ([place]) 02/06/2022   Carotid artery stenosis 02/06/2022   Moderate aortic stenosis 02/06/2022   History of maternal pulmonary embolus 02/06/2022   Essential hypertension 02/06/2022   Tobacco abuse 02/06/2022   Obesity (BMI 30-39.9) 02/06/2022   Cardiac syncope 02/06/2022   COPD with acute exacerbation ([place]) 02/04/2022   On mechanically assisted ventilation ([place]) 02/03/2022   Acute respiratory failure with hypoxia ([place]) 02/03/2022   Altered mental status 02/02/2022   Fall 02/02/2022   Chronic respiratory failure with hypoxia and hypercapnia ([place]) 02/02/2022   APS (antiphospholipid syndrome) ([place]) 01/23/2022   Traumatic hematoma of buttock 01/22/2022   Chronic respiratory failure with hypoxia ([place]) 01/22/2022   ABLA (acute blood loss anemia) 01/22/2022   Recurrent syncope 01/22/2022   Morbid obesity ([place]) 12/19/2021   Memory impairment 12/18/2021   COPD with acute exacerbation ([place]) 11/06/2021   Acute on chronic respiratory failure with hypercapnia ([place]) 11/05/2021   AKI (acute kidney injury) ([place]) 11/05/2021   Obesity (BMI 30-39.9)    Benign prostatic hyperplasia without lower urinary tract symptoms    COPD  exacerbation ([place]) 07/13/2020   Prolonged QT interval 07/13/2020   Acute on chronic respiratory failure with hypoxia and hypercapnia (HCC) 07/13/2020   Chronic diastolic CHF (congestive heart failure) ([place]) 07/13/2020   Left thyroid nodule 01/25/2020   Community acquired pneumonia 01/18/2020   Coronary atherosclerosis 10/04/2017   GERD (gastroesophageal reflux disease) 10/04/2017   Essential hypertension 10/04/2017   Transient visual loss 10/04/2017   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   Nonrheumatic aortic valve stenosis 03/31/2017   Pericarditis, Acute 02/12/2017   Acute on chronic right heart failure ([place]) 02/11/2017   Angina at rest 08/17/2016   Unstable angina ([place]) 08/16/2016   Tobacco abuse 08/16/2016   Antiphospholipid syndrome ([place]) 08/16/2016   Hyperlipidemia 08/16/2016   Essential hypertension, benign 08/16/2016   COPD, severe ([place]) 08/16/2016  Sleep apnea 08/16/2016   CAD S/P multiple PCIs 08/16/2016   History of pulmonary embolus (PE)-2011 08/16/2016   Chronic anticoagulation-[place] 08/16/2016   Type 2 diabetes mellitus with hyperlipidemia ([place]) 08/16/2016   Carotid artery disease ([place]) 08/16/2016   RBBB (right bundle branch block) 08/16/2016   Dyspnea on exertion 10/17/2015   ED (erectile dysfunction) 08/09/2012   Anemia 09/12/2011   PCP:  Vaughan Basta, MD Pharmacy:   [facility], [place] [address] AT [facility] [address] [place] [address] Phone: [phone] Fax: [phone]  [facility] [address] [place] [address] Phone: [phone] Fax: [phone]

## 2022-06-04 NOTE — ED Notes (Signed)
Patient to room 29. Patient to room in w/c .   Patient oriented to unit and room.  Patient on 4L O2 

## 2022-06-05 DIAGNOSIS — Z046 Encounter for general psychiatric examination, requested by authority: Secondary | ICD-10-CM | POA: Diagnosis not present

## 2022-06-05 DIAGNOSIS — R259 Unspecified abnormal involuntary movements: Secondary | ICD-10-CM | POA: Diagnosis not present

## 2022-06-05 LAB — PROTIME-INR
INR: 2.5 — ABNORMAL HIGH (ref 0.8–1.2)
Prothrombin Time: 27 seconds — ABNORMAL HIGH (ref 11.4–15.2)

## 2022-06-05 MED ORDER — ALBUTEROL SULFATE HFA 108 (90 BASE) MCG/ACT IN AERS
2.0000 | INHALATION_SPRAY | Freq: Four times a day (QID) | RESPIRATORY_TRACT | Status: DC
  Administered 2022-06-05 – 2022-06-06 (×5): 2 via RESPIRATORY_TRACT
  Filled 2022-06-05 (×2): qty 6.7

## 2022-06-05 NOTE — Progress Notes (Signed)
TOC CSW received a call from [employer].  Deputy Lolar said he was able to make contact with pts wife, Byrle Goben [phone].  Enid Derry told Nurse, mental health she was on her way home, she had to run to the store and was unaware of the hospital trying to contact her.  Deputy Lolar stated he let her know that pt was on his way home from the hospital.  There is also no restraining order documented.  Calahan Pak Tarpley-Carter, MSW, LCSW-A Pronouns:  She/Her/Hers [facility] Clinical Social Worker Direct Number:  [phone] [email or web address]
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
## 2022-06-05 NOTE — ED Notes (Signed)
Patient is alert and oriented. Patient medication compliant. No s/s of distress at this times.
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
## 2022-06-05 NOTE — Progress Notes (Signed)
TOC CSW spoke with pt daughter, Berneda Rose [phone].  Verline Lema states that she will look into pt finding a ALF.  Verline Lema has accepted [facility] to [facility].  Vitaliy Eisenhour Tarpley-Carter, MSW, LCSW-A Pronouns:  She/Her/Hers [facility] Clinical Social Worker Direct Number:  [phone] [email or web address]

## 2022-06-05 NOTE — Progress Notes (Addendum)
This CSW staffed case with [facility] Lt[facility] supervisor, Beverlee Nims, who informed that if the pt wishes to return home, we are unable to hold him here in the ED due to pt having capacity. The hospital or patient also does not have any evidence of a 30 day eviction notice or restraining order against the pt. Pt reported to this writer earlier in the morning that he is unaware of a restraining order. The pt will need to speak with his wife about getting his house keys so he may access the home. Per RN, pt is attempting to contact his wife to inquire about his keys. Pt will need to transport home via [facility]. This Probation officer noted there are no recommendations for PT follow up. EDP and RN notified via secure chat.

## 2022-06-05 NOTE — Progress Notes (Signed)
TOC CSW has done a well check on pts wife, due to multiple attempts to contact her by phone [phone].  CSW contacted [employer] at the non-emergent number [phone].  CSW currently awaiting a return call from [employer].  Marieta Markov Tarpley-Carter, MSW, LCSW-A Pronouns:  She/Her/Hers [facility] Clinical Social Worker Direct Number:  [phone] [email or web address]

## 2022-06-05 NOTE — Progress Notes (Signed)
TOC CSW has made several attempts to contact pts wife [phone], with no answer or room on vm to leave a message.  CSW attempted to contact pts daughter, Berneda Rose [phone].  CSW left HIPPA compliant message with my contact information.   Yanci Bachtell Tarpley-Carter, MSW, LCSW-A Pronouns:  She/Her/Hers [facility] Clinical Social Worker Direct Number:  [phone] [email or web address]

## 2022-06-05 NOTE — Evaluation (Signed)
Physical Therapy Evaluation Patient Details Name: Jose [name] MRN: [id number] DOB: 09-16-1955 Today's Date: 06/05/2022  History of Present Illness  Jose [name] is a 67 y.o. male with vertebral artery insufficiency, antiphospholipid antibodies syndrome, hypertension, diabetes, obstructive sleep apnea, CAD s/p multiple PCIs s/p CABG x3, HFpEF, PAD, COPD and recurrent episodes of syncope, on chronic oxygen/chronic warfarin anticoagulation, who presents with IVC.  Clinical Impression  Pt admitted with above diagnosis.  Pt currently with functional limitations due to the deficits listed below (see PT Problem List). Pt will benefit from acute skilled PT to increase their independence and safety with mobility to allow discharge.    The patient  reports that he is ambulatory on 4 LPM, has multiple falls related to syncope, per patient. Patient actually has a log of his falls on his which he showed the therapist.  Patient at this time will benefit from PT while in acute care but does not require PT after DC, patient  is near his baseline. SPO2 on 4 L 94% after ambulation     Recommendations for follow up therapy are one component of a multi-disciplinary discharge planning process, led by the attending physician.  Recommendations may be updated based on patient status, additional functional criteria and insurance authorization.  Follow Up Recommendations No PT follow up      Assistance Recommended at Discharge PRN  Patient can return home with the following  Assistance with cooking/housework;Assist for transportation    Equipment Recommendations None recommended by PT  Recommendations for Other Services       Functional Status Assessment Patient has not had a recent decline in their functional status     Precautions / Restrictions Precautions Precaution Comments: pt. reports multiple  falls, syncopal episodes, uses 4 LPM at baseline      Mobility  Bed Mobility Overal bed mobility:  Modified Independent                  Transfers Overall transfer level: Needs assistance Equipment used: Rolling walker (2 wheels) Transfers: Sit to/from Stand Sit to Stand: Supervision                Ambulation/Gait Ambulation/Gait assistance: Supervision Gait Distance (Feet): 30 Feet Assistive device: Rolling walker (2 wheels) Gait Pattern/deviations: Step-to pattern, Step-through pattern Gait velocity: decr     General Gait Details: slow to ambulate, 3/4 dyspnea  Stairs            Wheelchair Mobility    Modified Rankin (Stroke Patients Only)       Balance Overall balance assessment: History of Falls, Needs assistance   Sitting balance-[name] Scale: Good     Standing balance support: Bilateral upper extremity supported, During functional activity, Reliant on assistive device for balance Standing balance-[name] Scale: Fair                               Pertinent Vitals/Pain Pain Assessment Pain Assessment: Faces Faces Pain Scale: Hurts little more Pain Location: back Pain Descriptors / Indicators: Discomfort Pain Intervention(s): Monitored during session    Home Living Family/patient expects to be discharged to:: Unsure     Type of Home: House Home Access: Stairs to enter Entrance Stairs-Rails: Psychiatric nurse of Steps: 3-4   Home Layout: One level Home Equipment: Rollator (4 wheels) Additional Comments: Sleeps in recliner.    Prior Function Prior Level of Function : Patient poor historian/Family not available  Mobility Comments: Per pt he is modified independent with household ambulation with assistive device. Frequent syncope. Sleeps in recliner       Hand Dominance   Dominant Hand: Right    Extremity/Trunk Assessment   Upper Extremity Assessment Upper Extremity Assessment: Overall WFL for tasks assessed    Lower Extremity Assessment Lower Extremity Assessment: Generalized  weakness    Cervical / Trunk Assessment Cervical / Trunk Assessment: Normal  Communication   Communication: No difficulties  Cognition Arousal/Alertness: Awake/alert Behavior During Therapy: WFL for tasks assessed/performed Overall Cognitive Status: Difficult to assess                                 General Comments: very tangential thoghts, initially stated he did not  walk, then stated he did, describes all is medcal issues, pulled up his phone log on how many falls he has had each month        General Comments      Exercises     Assessment/Plan    PT Assessment Patient needs continued PT services  PT Problem List Decreased activity tolerance;Decreased safety awareness       PT Treatment Interventions DME instruction;Gait training;Functional mobility training;Therapeutic activities;Patient/family education    PT Goals (Current goals can be found in the Care Plan section)  Acute Rehab PT Goals Patient Stated Goal: wants to go home PT Goal Formulation: With patient Time For Goal Achievement: 06/19/22 Potential to Achieve Goals: Good    Frequency Min 2X/week     Co-evaluation               AM-PAC PT "6 Clicks" Mobility  Outcome Measure Help needed turning from your back to your side while in a flat bed without using bedrails?: None Help needed moving from lying on your back to sitting on the side of a flat bed without using bedrails?: None Help needed moving to and from a bed to a chair (including a wheelchair)?: None Help needed standing up from a chair using your arms (e.g., wheelchair or bedside chair)?: A Little Help needed to walk in hospital room?: A Little Help needed climbing 3-5 steps with a railing? : A Lot 6 Click Score: 20    End of Session Equipment Utilized During Treatment: Gait belt Activity Tolerance: Patient tolerated treatment well Patient left: in chair;with call bell/phone within reach Nurse Communication: Mobility  status PT Visit Diagnosis: Unsteadiness on feet (R26.81)    Time: [id number] PT Time Calculation (min) (ACUTE ONLY): 31 min   Charges:   PT Evaluation $PT Eval Low Complexity: 1 Low PT Treatments $Gait Training: 8-22 mins        [place] Office [phone] Weekend [phone]   Claretha Cooper 06/05/2022, 12:55 PM

## 2022-06-05 NOTE — ED Notes (Signed)
Sleeping with no signs of distress or discomfort respirations are easy and skin color is appropriate for  pt's ethnicity.

## 2022-06-05 NOTE — ED Notes (Signed)
Pt c/o increasing SOB while on 4l n/c  lungs assessed as clear but decreased all fields.

## 2022-06-05 NOTE — ED Provider Notes (Signed)
9:33 AM Patient awake, alert, in no distress, sitting upright, eating breakfast and using his cellular telephone.  He is on his home oxygen supplementation, has no new complaints.  Social work is aware, plan is for discharge, when safe plan is in place.   Carmin Muskrat, MD 06/05/22 [phone]

## 2022-06-06 DIAGNOSIS — R259 Unspecified abnormal involuntary movements: Secondary | ICD-10-CM | POA: Diagnosis not present

## 2022-06-06 DIAGNOSIS — Z046 Encounter for general psychiatric examination, requested by authority: Secondary | ICD-10-CM | POA: Diagnosis not present

## 2022-06-06 NOTE — ED Notes (Signed)
Patient refuses to get in bed at this time, requesting to only sit in chair to sleep.

## 2022-06-06 NOTE — Progress Notes (Addendum)
This Probation officer contacted pt's daughter, Verline Lema, who was unaware that her father was returned to the hospital. Verline Lema inquired about the pt's belongings and was told that per [employer], an officer will escort the pt to get his belongings. Verline Lema stated "this is not the first time things have gotten this bad and I'm really sorry." This Probation officer assured kaitlyn the hospital staff are wanting to collaborate with her to determine the most appropriate disposition. Verline Lema inquired about how long the restraining order is - Per [employer], restraining order is 30 days but could be shortened in court and the court date is on 3/28. Verline Lema wrote down the information. This Probation officer informed Verline Lema that since the pt has been here, he has declined wanting to go to a facility. Verline Lema reported he has told her the same. This CSW told Verline Lema that if the pt changes his mind, he would need Long Term Medicaid in order to be placed into a facility permanently. Verline Lema is aware that the process has to be started at [facility]. Verline Lema reported she will attempt to apply on her dads behalf at [place].   Verline Lema will speak with her father and update this CSW on plans to pick the pt up.   Addend @ 11:52 AM Notified by RN via secure chat: "they  ([employer]) have been able to verify that there is no active restraining order and that he has an order to appear for the court date on the 28th"  [place] is supposed to go to the home and contact [facility] when they are there and have located the wife so [facility] may return the pt home (since there is not an active restraining order, only an order to appear in court."  Addend @ 12:22 PM This CSW was notified by RN that the plan is for the pt's wife to leave the back door unlocked at [facility] will transport home. This CSW spoke with pt's daughter, Verline Lema, who states he spoke with Corporal Damita Dunnings and there is not a restraining order so the pt may return home. RN has called [facility] for transport at 2.
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
## 2022-06-06 NOTE — Progress Notes (Signed)
Notified by RN that she will be calling [facility] to transport the pt home and the pt will be calling the police to meet them there.

## 2022-06-06 NOTE — ED Notes (Signed)
Patient in room requesting to go back home, states that "Its my home and I deserve to be let in".

## 2022-06-06 NOTE — ED Provider Notes (Signed)
Emergency Medicine Observation Re-evaluation Note  Jose [name] is a 67 y.o. male, seen on rounds today.  Pt initially presented to the ED for complaints of No chief complaint on file. Currently, the patient is this note is being created for the purposes of discharging this patient..  Physical Exam  BP 126/61   Pulse 72   Temp 98.5 F (36.9 C) (Oral)   Resp 18   Ht 1.727 m (5\' 8" )   Wt 111.1 kg   SpO2 100%   BMI 37.25 kg/m  Physical Exam  ED Course / MDM  EKG:EKG Interpretation  Date/Time:  Wednesday June 03 2022 17:44:51 EDT Ventricular Rate:  81 PR Interval:  149 QRS Duration: 157 QT Interval:  439 QTC Calculation: 510 R Axis:   54 Text Interpretation: Sinus rhythm Right bundle branch block Confirmed by Cindee Lame [phone]) on 06/03/2022 7:39:49 PM  I have reviewed the labs performed to date as well as medications administered while in observation.  Recent changes in the last 24 hours include patient has been discharged.  Plan  Current plan is for discharge.    Lacretia Leigh, MD 06/06/22 [phone]
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
## 2022-06-06 NOTE — ED Notes (Signed)
Attempting to discharge pt this AM.  [facility] unable to get in touch with wife.  [facility] department called to go by home.  [employer] officer on duty here reviewed restraining order that was served on pt last night.  Per [employer] order states that pt can not return to home by court order.  Same discussed is TOC, will attempt to find alternative safe discharge.

## 2022-06-17 DIAGNOSIS — G903 Multi-system degeneration of the autonomic nervous system: Secondary | ICD-10-CM | POA: Diagnosis not present

## 2022-06-19 DIAGNOSIS — J9611 Chronic respiratory failure with hypoxia: Secondary | ICD-10-CM | POA: Diagnosis not present

## 2022-06-19 DIAGNOSIS — R55 Syncope and collapse: Secondary | ICD-10-CM | POA: Diagnosis not present

## 2022-06-19 DIAGNOSIS — J449 Chronic obstructive pulmonary disease, unspecified: Secondary | ICD-10-CM | POA: Diagnosis not present

## 2022-06-25 DIAGNOSIS — J9610 Chronic respiratory failure, unspecified whether with hypoxia or hypercapnia: Secondary | ICD-10-CM | POA: Diagnosis not present

## 2022-06-25 DIAGNOSIS — R55 Syncope and collapse: Secondary | ICD-10-CM | POA: Diagnosis not present

## 2022-06-25 DIAGNOSIS — J449 Chronic obstructive pulmonary disease, unspecified: Secondary | ICD-10-CM | POA: Diagnosis not present

## 2022-07-14 DIAGNOSIS — R55 Syncope and collapse: Secondary | ICD-10-CM | POA: Diagnosis not present

## 2022-07-14 DIAGNOSIS — G20C Parkinsonism, unspecified: Secondary | ICD-10-CM | POA: Diagnosis not present

## 2022-07-16 ENCOUNTER — Emergency Department (HOSPITAL_COMMUNITY): Payer: Medicare Other

## 2022-07-16 ENCOUNTER — Emergency Department (HOSPITAL_COMMUNITY)
Admission: EM | Admit: 2022-07-16 | Discharge: 2022-07-16 | Disposition: A | Discharge status: Home / Self Care | Payer: Medicare Other | Attending: Emergency Medicine | Admitting: Emergency Medicine

## 2022-07-16 DIAGNOSIS — J449 Chronic obstructive pulmonary disease, unspecified: Secondary | ICD-10-CM | POA: Diagnosis not present

## 2022-07-16 DIAGNOSIS — Z7901 Long term (current) use of anticoagulants: Secondary | ICD-10-CM | POA: Diagnosis not present

## 2022-07-16 DIAGNOSIS — S0081XA Abrasion of other part of head, initial encounter: Secondary | ICD-10-CM | POA: Insufficient documentation

## 2022-07-16 DIAGNOSIS — Z7951 Long term (current) use of inhaled steroids: Secondary | ICD-10-CM | POA: Diagnosis not present

## 2022-07-16 DIAGNOSIS — W19XXXA Unspecified fall, initial encounter: Secondary | ICD-10-CM | POA: Insufficient documentation

## 2022-07-16 DIAGNOSIS — R55 Syncope and collapse: Secondary | ICD-10-CM | POA: Diagnosis not present

## 2022-07-16 DIAGNOSIS — S0990XA Unspecified injury of head, initial encounter: Secondary | ICD-10-CM

## 2022-07-16 DIAGNOSIS — S5001XA Contusion of right elbow, initial encounter: Secondary | ICD-10-CM | POA: Insufficient documentation

## 2022-07-16 DIAGNOSIS — S59901A Unspecified injury of right elbow, initial encounter: Secondary | ICD-10-CM | POA: Diagnosis present

## 2022-07-16 LAB — CBC WITH DIFFERENTIAL/PLATELET
Abs Immature Granulocytes: 0.07 10*3/uL (ref 0.00–0.07)
Basophils Absolute: 0.1 10*3/uL (ref 0.0–0.1)
Basophils Relative: 1 %
Eosinophils Absolute: 0.1 10*3/uL (ref 0.0–0.5)
Eosinophils Relative: 1 %
HCT: 28.2 % — ABNORMAL LOW (ref 39.0–52.0)
Hemoglobin: 8.4 g/dL — ABNORMAL LOW (ref 13.0–17.0)
Immature Granulocytes: 1 %
Lymphocytes Relative: 18 %
Lymphs Abs: 2.8 10*3/uL (ref 0.7–4.0)
MCH: 25.3 pg — ABNORMAL LOW (ref 26.0–34.0)
MCHC: 29.8 g/dL — ABNORMAL LOW (ref 30.0–36.0)
MCV: 84.9 fL (ref 80.0–100.0)
Monocytes Absolute: 1.9 10*3/uL — ABNORMAL HIGH (ref 0.1–1.0)
Monocytes Relative: 12 %
Neutro Abs: 10.6 10*3/uL — ABNORMAL HIGH (ref 1.7–7.7)
Neutrophils Relative %: 67 %
Platelets: 260 10*3/uL (ref 150–400)
RBC: 3.32 MIL/uL — ABNORMAL LOW (ref 4.22–5.81)
RDW: 18.3 % — ABNORMAL HIGH (ref 11.5–15.5)
WBC: 15.5 10*3/uL — ABNORMAL HIGH (ref 4.0–10.5)
nRBC: 0 % (ref 0.0–0.2)

## 2022-07-16 LAB — BASIC METABOLIC PANEL
Anion gap: 11 (ref 5–15)
BUN: 12 mg/dL (ref 8–23)
CO2: 26 mmol/L (ref 22–32)
Calcium: 8.7 mg/dL — ABNORMAL LOW (ref 8.9–10.3)
Chloride: 97 mmol/L — ABNORMAL LOW (ref 98–111)
Creatinine, Ser: 0.8 mg/dL (ref 0.61–1.24)
GFR, Estimated: >60 mL/min (ref >60)
Glucose, Bld: 160 mg/dL — ABNORMAL HIGH (ref 70–99)
Potassium: 4.4 mmol/L (ref 3.5–5.1)
Sodium: 134 mmol/L — ABNORMAL LOW (ref 135–145)

## 2022-07-16 LAB — I-STAT VENOUS BLOOD GAS, ED
Acid-Base Excess: 5 mmol/L — ABNORMAL HIGH (ref 0.0–2.0)
Bicarbonate: 30 mmol/L — ABNORMAL HIGH (ref 20.0–28.0)
Calcium, Ion: 1.09 mmol/L — ABNORMAL LOW (ref 1.15–1.40)
HCT: 28 % — ABNORMAL LOW (ref 39.0–52.0)
Hemoglobin: 9.5 g/dL — ABNORMAL LOW (ref 13.0–17.0)
O2 Saturation: 60 %
Potassium: 4.4 mmol/L (ref 3.5–5.1)
Sodium: 137 mmol/L (ref 135–145)
TCO2: 31 mmol/L (ref 22–32)
pCO2, Ven: 45.7 mmHg (ref 44–60)
pH, Ven: 7.424 (ref 7.25–7.43)
pO2, Ven: 31 mmHg — CL (ref 32–45)

## 2022-07-16 LAB — PROTIME-INR
INR: 3.2 — ABNORMAL HIGH (ref 0.8–1.2)
Prothrombin Time: 32.2 seconds — ABNORMAL HIGH (ref 11.4–15.2)

## 2022-07-16 NOTE — Discharge Instructions (Signed)
You were seen in the emergency department for evaluation of injuries after a fall.  You had a CAT scan of your head neck and face and an x-ray of your right elbow that did not show any fractures or internal bleeding.  Please use ice to the affected areas, soap and water, Tylenol for pain.  Follow-up with your regular doctor.  Return to the emergency department if any worsening or concerning symptoms.

## 2022-07-16 NOTE — ED Provider Notes (Signed)
[place] [facility] AT [facility] Provider Note   CSN: [id number] Arrival date & time: 07/16/22  1430     History  Chief Complaint  Patient presents with   Jose [name] is a 67 y.o. male.  He has a history of syncope COPD antiphospholipid antibodies, on Coumadin.  He is here for evaluation of syncope fall last evening.  He was not evaluated after his fall.  Wife thinks he might of hit his face and head while in the bathroom.  He is complaining of of injury to his scalp and pain in his left cheek.  Also has right elbow pain and some bruising on his right hand.  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  Denies chest pain or shortness of breath.  EMS relates that wife said he seemed a little more confused today necessitating the call to them for evaluation.  He is a level 2 trauma activation.  The history is provided by the patient and the EMS personnel.  Fall This is a new problem. The current episode started yesterday. The problem occurs every several days. The problem has not changed since onset.Associated symptoms include headaches. Pertinent negatives include no chest pain, no abdominal pain and no shortness of breath. Associated symptoms comments: Right elbow pain, left cheek pain. The symptoms are aggravated by bending and twisting. Nothing relieves the symptoms. He has tried rest for the symptoms. The treatment provided no relief.       Home Medications Prior to Admission medications   Medication Sig Start Date End Date Taking? Authorizing Provider  acetaminophen (TYLENOL) 500 MG tablet Take 1,000 mg by mouth every 6 (six) hours as needed for mild pain.    [provider]  albuterol (PROVENTIL) (5 MG/ML) 0.5% nebulizer solution Take 0.5 mLs (2.5 mg total) by nebulization every 6 (six) hours as needed for wheezing or shortness of breath. Patient not taking: Reported on 06/04/2022  12/27/18   Terald Sleeper, MD  albuterol (VENTOLIN HFA) 108 (90 Base) MCG/ACT inhaler Inhale 2 puffs into the lungs every 6 (six) hours as needed for wheezing or shortness of breath.    [provider]  DULoxetine (CYMBALTA) 60 MG capsule Take 60 mg by mouth at bedtime. Take one capsule (60 mg) along with 30 mg capsule to equal 90 mg daily    [provider]  DULoxetine HCl 30 MG CSDR Take 30 mg by mouth at bedtime. 11/02/21   [provider]  Fluticasone-Umeclidin-Vilant (TRELEGY ELLIPTA IN) Inhale 1 puff into the lungs daily.    [provider]  gabapentin (NEURONTIN) 600 MG tablet Take 600 mg by mouth 2 (two) times daily. 09/02/21   [provider]  glimepiride (AMARYL) 4 MG tablet Take 1 tablet (4 mg total) by mouth daily with breakfast. Patient taking differently: Take 4 mg by mouth 2 (two) times daily. 02/19/22   Meredeth Ide, MD  HYDROcodone-acetaminophen (NORCO/VICODIN) 5-325 MG tablet Take 1 tablet by mouth daily as needed for moderate pain. 04/23/22   [provider]  metoprolol tartrate (LOPRESSOR) 25 MG tablet Take 0.5 tablets (12.5 mg total) by mouth 2 (two) times daily. Patient not taking: Reported on 04/01/2022 02/19/22   Meredeth Ide, MD  nitroGLYCERIN (NITROSTAT) 0.4 MG SL tablet Place 0.4 mg under the tongue every 5 (five) minutes as needed for chest pain.  07/07/16   [provider]  OXYGEN Inhale 4-6 L into the lungs daily.    [provider]  pantoprazole (PROTONIX) 40 MG tablet Take 40 mg by mouth 2 (two) times daily.    [provider]  rosuvastatin (CRESTOR) 10 MG tablet Take 10 mg by mouth every evening. 07/07/16   [provider]  tamsulosin (FLOMAX) 0.4 MG CAPS capsule Take 1 capsule (0.4 mg total) by mouth at bedtime. Patient taking differently: Take 0.8 mg by mouth daily. 02/06/22   Wouk, Wilfred Curtis, MD  topiramate (TOPAMAX) 50 MG tablet Take 1.5 tablets (75 mg total) by mouth 2 (two)  times daily. Patient taking differently: Take 75 mg by mouth 3 (three) times daily. 02/06/22   Wouk, Wilfred Curtis, MD  torsemide (DEMADEX) 20 MG tablet Take 20 mg by mouth daily.    [provider]  warfarin (COUMADIN) 5 MG tablet Take 5 mg by mouth every evening.    [provider]      Allergies    Melatonin and Altace [ramipril]    Review of Systems   Review of Systems  Constitutional:  Negative for fever.  Eyes:  Negative for visual disturbance.  Respiratory:  Negative for shortness of breath.   Cardiovascular:  Negative for chest pain.  Gastrointestinal:  Negative for abdominal pain.  Musculoskeletal:  Negative for neck pain.  Skin:  Positive for wound.  Neurological:  Positive for syncope and headaches.    Physical Exam Updated Vital Signs BP 128/69 (BP Location: Right Arm)   Pulse 91   Temp 99.3 F (37.4 C) (Oral)   Resp 18   SpO2 99%  Physical Exam Vitals and nursing note reviewed.  Constitutional:      General: He is not in acute distress.    Appearance: Normal appearance. He is well-developed.  HENT:     Head: Normocephalic.     Comments: He has some abrasions over his forehead and some tenderness of his left cheek.  No gross facial instability Eyes:     Conjunctiva/sclera: Conjunctivae normal.  Cardiovascular:     Rate and Rhythm: Normal rate and regular rhythm.     Heart sounds: No murmur heard. Pulmonary:     Effort: Pulmonary effort is normal. No respiratory distress.     Breath sounds: Normal breath sounds.  Abdominal:     Palpations: Abdomen is soft.     Tenderness: There is no abdominal tenderness.  Musculoskeletal:        General: Tenderness present. No deformity.     Cervical back: Neck supple.     Right lower leg: Edema present.     Left lower leg: Edema present.     Comments: He has some bruising on the dorsum of his right hand and some tenderness of his right elbow.  Full range of motion of his upper extremities without any  limitations.  Both of his lower extremities are fairly stiff without full range of motion although patient denies any new pain.  Skin:    General: Skin is warm and dry.     Capillary Refill: Capillary refill takes less than 2 seconds.  Neurological:     General: No focal deficit present.     Mental Status: He is alert and oriented to person, place, and time.     Cranial Nerves: No cranial nerve deficit.     Sensory: No sensory deficit.     Motor: No weakness.     ED Results / Procedures / Treatments   Labs (all labs  ordered are listed, but only abnormal results are displayed) Labs Reviewed  PROTIME-INR - Abnormal; Notable for the following components:      Result Value   Prothrombin Time 32.2 (*)    INR 3.2 (*)    All other components within normal limits  BASIC METABOLIC PANEL - Abnormal; Notable for the following components:   Sodium 134 (*)    Chloride 97 (*)    Glucose, Bld 160 (*)    Calcium 8.7 (*)    All other components within normal limits  CBC WITH DIFFERENTIAL/PLATELET - Abnormal; Notable for the following components:   WBC 15.5 (*)    RBC 3.32 (*)    Hemoglobin 8.4 (*)    HCT 28.2 (*)    MCH 25.3 (*)    MCHC 29.8 (*)    RDW 18.3 (*)    Neutro Abs 10.6 (*)    Monocytes Absolute 1.9 (*)    All other components within normal limits  I-STAT VENOUS BLOOD GAS, ED - Abnormal; Notable for the following components:   pO2, Ven 31 (*)    Bicarbonate 30.0 (*)    Acid-Base Excess 5.0 (*)    Calcium, Ion 1.09 (*)    HCT 28.0 (*)    Hemoglobin 9.5 (*)    All other components within normal limits    EKG EKG Interpretation  Date/Time:  Thursday Jul 16 2022 15:06:39 EDT Ventricular Rate:  86 PR Interval:  152 QRS Duration: 155 QT Interval:  430 QTC Calculation: 515 R Axis:   48 Text Interpretation: Sinus rhythm Probable left atrial enlargement Right bundle branch block No significant change since prior 3/24 Confirmed by Meridee Score [phone]) on 07/16/2022 3:15:31  PM  Radiology DG Elbow Complete Right  Result Date: 07/16/2022 CLINICAL DATA:  Trauma, fall, pain EXAM: RIGHT ELBOW - COMPLETE 3+ VIEW COMPARISON:  None Available. FINDINGS: No displaced fracture or dislocation is seen. There is no displacement of posterior fat pad. There is soft tissue swelling over the olecranon process. There is stranding in the fat planes in subcutaneous plane. Small smoothly marginated calcifications are noted adjacent to the medial epicondyle of distal humerus and coronoid process of proximal ulna, possibly residual from previous injury. IMPRESSION: No recent fracture or dislocation is seen in right elbow. There is no significant effusion. There is soft tissue swelling over the olecranon process suggesting soft tissue contusion or hematoma or bursitis. Electronically Signed   By: Ernie Avena M.D.   On: 07/16/2022 16:14   CT Head Wo Contrast  Result Date: 07/16/2022 CLINICAL DATA:  Fall, left-sided facial droop and swelling. EXAM: CT HEAD WITHOUT CONTRAST CT MAXILLOFACIAL WITHOUT CONTRAST CT CERVICAL SPINE WITHOUT CONTRAST TECHNIQUE: Multidetector CT imaging of the head, cervical spine, and maxillofacial structures were performed using the standard protocol without intravenous contrast. Multiplanar CT image reconstructions of the cervical spine and maxillofacial structures were also generated. RADIATION DOSE REDUCTION: This exam was performed according to the departmental dose-optimization program which includes automated exposure control, adjustment of the mA and/or kV according to patient size and/or use of iterative reconstruction technique. COMPARISON:  CT head/face/cervical spine 02/20/2022. FINDINGS: CT HEAD FINDINGS Brain: There is no acute intracranial hemorrhage, extra-axial fluid collection, or acute infarct. Parenchymal volume is normal. The ventricles are normal in size. Gray-white differentiation is preserved. There is a small area of encephalomalacia in the right  superior frontal gyrus in the location of the previously seen intraparenchymal hematoma. There is no mass lesion.  There is no mass effect  or midline shift. Vascular: There is calcification of the bilateral carotid siphons. Skull: Normal. Negative for fracture or focal lesion. Other: None. CT MAXILLOFACIAL FINDINGS Osseous: There is no acute facial bone fracture. There is no suspicious osseous lesion. There is no evidence of mandibular dislocation. Orbits: The globes are intact, without evidence of traumatic injury. There is no retrobulbar hematoma. Sinuses: There is mild mucosal thickening along the floor of the left maxillary sinus. Soft tissues: There is left facial soft tissue swelling extending to the neck. Other: Multiple dental caries and periapical lucencies are noted. CT CERVICAL SPINE FINDINGS Alignment: Normal. There is no significant antero or retrolisthesis. There is no jumped or perched facet or other evidence of traumatic malalignment. Skull base and vertebrae: Skull base alignment is maintained. Mild compression deformities of the T2 and T3 vertebral bodies are unchanged. The other vertebral body heights are preserved, without evidence of acute fracture. There is no suspicious osseous lesion. Soft tissues and spinal canal: No prevertebral fluid or swelling. No visible canal hematoma. Disc levels: There is disc space narrowing and degenerative endplate change most advanced at C5-C6 and C6-C7. Upper chest: The imaged lung apices are clear. Other: None. IMPRESSION: 1. No acute intracranial pathology. 2. No acute facial bone fracture. Left facial soft tissue swelling extending to the neck. 3. No acute fracture or traumatic malalignment of the cervical spine. Electronically Signed   By: Lesia Hausen M.D.   On: 07/16/2022 15:09   CT Maxillofacial WO CM  Result Date: 07/16/2022 CLINICAL DATA:  Fall, left-sided facial droop and swelling. EXAM: CT HEAD WITHOUT CONTRAST CT MAXILLOFACIAL WITHOUT CONTRAST CT  CERVICAL SPINE WITHOUT CONTRAST TECHNIQUE: Multidetector CT imaging of the head, cervical spine, and maxillofacial structures were performed using the standard protocol without intravenous contrast. Multiplanar CT image reconstructions of the cervical spine and maxillofacial structures were also generated. RADIATION DOSE REDUCTION: This exam was performed according to the departmental dose-optimization program which includes automated exposure control, adjustment of the mA and/or kV according to patient size and/or use of iterative reconstruction technique. COMPARISON:  CT head/face/cervical spine 02/20/2022. FINDINGS: CT HEAD FINDINGS Brain: There is no acute intracranial hemorrhage, extra-axial fluid collection, or acute infarct. Parenchymal volume is normal. The ventricles are normal in size. Gray-white differentiation is preserved. There is a small area of encephalomalacia in the right superior frontal gyrus in the location of the previously seen intraparenchymal hematoma. There is no mass lesion.  There is no mass effect or midline shift. Vascular: There is calcification of the bilateral carotid siphons. Skull: Normal. Negative for fracture or focal lesion. Other: None. CT MAXILLOFACIAL FINDINGS Osseous: There is no acute facial bone fracture. There is no suspicious osseous lesion. There is no evidence of mandibular dislocation. Orbits: The globes are intact, without evidence of traumatic injury. There is no retrobulbar hematoma. Sinuses: There is mild mucosal thickening along the floor of the left maxillary sinus. Soft tissues: There is left facial soft tissue swelling extending to the neck. Other: Multiple dental caries and periapical lucencies are noted. CT CERVICAL SPINE FINDINGS Alignment: Normal. There is no significant antero or retrolisthesis. There is no jumped or perched facet or other evidence of traumatic malalignment. Skull base and vertebrae: Skull base alignment is maintained. Mild compression  deformities of the T2 and T3 vertebral bodies are unchanged. The other vertebral body heights are preserved, without evidence of acute fracture. There is no suspicious osseous lesion. Soft tissues and spinal canal: No prevertebral fluid or swelling. No visible canal hematoma. Disc  levels: There is disc space narrowing and degenerative endplate change most advanced at C5-C6 and C6-C7. Upper chest: The imaged lung apices are clear. Other: None. IMPRESSION: 1. No acute intracranial pathology. 2. No acute facial bone fracture. Left facial soft tissue swelling extending to the neck. 3. No acute fracture or traumatic malalignment of the cervical spine. Electronically Signed   By: Lesia Hausen M.D.   On: 07/16/2022 15:09   CT Cervical Spine Wo Contrast  Result Date: 07/16/2022 CLINICAL DATA:  Fall, left-sided facial droop and swelling. EXAM: CT HEAD WITHOUT CONTRAST CT MAXILLOFACIAL WITHOUT CONTRAST CT CERVICAL SPINE WITHOUT CONTRAST TECHNIQUE: Multidetector CT imaging of the head, cervical spine, and maxillofacial structures were performed using the standard protocol without intravenous contrast. Multiplanar CT image reconstructions of the cervical spine and maxillofacial structures were also generated. RADIATION DOSE REDUCTION: This exam was performed according to the departmental dose-optimization program which includes automated exposure control, adjustment of the mA and/or kV according to patient size and/or use of iterative reconstruction technique. COMPARISON:  CT head/face/cervical spine 02/20/2022. FINDINGS: CT HEAD FINDINGS Brain: There is no acute intracranial hemorrhage, extra-axial fluid collection, or acute infarct. Parenchymal volume is normal. The ventricles are normal in size. Gray-white differentiation is preserved. There is a small area of encephalomalacia in the right superior frontal gyrus in the location of the previously seen intraparenchymal hematoma. There is no mass lesion.  There is no mass  effect or midline shift. Vascular: There is calcification of the bilateral carotid siphons. Skull: Normal. Negative for fracture or focal lesion. Other: None. CT MAXILLOFACIAL FINDINGS Osseous: There is no acute facial bone fracture. There is no suspicious osseous lesion. There is no evidence of mandibular dislocation. Orbits: The globes are intact, without evidence of traumatic injury. There is no retrobulbar hematoma. Sinuses: There is mild mucosal thickening along the floor of the left maxillary sinus. Soft tissues: There is left facial soft tissue swelling extending to the neck. Other: Multiple dental caries and periapical lucencies are noted. CT CERVICAL SPINE FINDINGS Alignment: Normal. There is no significant antero or retrolisthesis. There is no jumped or perched facet or other evidence of traumatic malalignment. Skull base and vertebrae: Skull base alignment is maintained. Mild compression deformities of the T2 and T3 vertebral bodies are unchanged. The other vertebral body heights are preserved, without evidence of acute fracture. There is no suspicious osseous lesion. Soft tissues and spinal canal: No prevertebral fluid or swelling. No visible canal hematoma. Disc levels: There is disc space narrowing and degenerative endplate change most advanced at C5-C6 and C6-C7. Upper chest: The imaged lung apices are clear. Other: None. IMPRESSION: 1. No acute intracranial pathology. 2. No acute facial bone fracture. Left facial soft tissue swelling extending to the neck. 3. No acute fracture or traumatic malalignment of the cervical spine. Electronically Signed   By: Lesia Hausen M.D.   On: 07/16/2022 15:09    Procedures Procedures    Medications Ordered in ED Medications - No data to display  ED Course/ Medical Decision Making/ A&P Clinical Course as of 07/16/22 1746  Thu Jul 16, 2022  1620 Patient's imaging does not show any significant traumatic injury.  He is worried about a concussion because he  has had multiple falls injuring his head.  We discussed that concussions do not show up on typical imaging and that the treatment is management of the symptoms.  He is comfortable plan for discharge and follow-up with his outpatient treatment team.  Return instructions discussed. [MB]  Clinical Course User Index [MB] Terrilee Files, MD                             Medical Decision Making Amount and/or Complexity of Data Reviewed Labs: ordered. Radiology: ordered.   This patient complains of head injury neck pain right elbow pain after fall; this involves an extensive number of treatment Options and is a complaint that carries with it a high risk of complications and morbidity. The differential includes syncope, vasovagal, arrhythmia, bleed, fracture  I ordered, reviewed and interpreted labs, which included CBC with elevated white count, hemoglobin slightly down from priors, chemistries with elevated glucose, VBG without significant CO2 retention, INR therapeutic I ordered imaging studies which included CT head and neck and max face, right shoulder x-ray and I independently    visualized and interpreted imaging which showed soft tissue swelling left cheek, no acute fractures Additional history obtained from EMS Previous records obtained and reviewed in epic including prior ED visits Cardiac monitoring reviewed, normal sinus rhythm Social determinants considered, no significant barriers Critical Interventions: None  After the interventions stated above, I reevaluated the patient and found patient to be awake alert no distress Admission and further testing considered, no indications for admission or further workup at this time.  Recommended close follow-up with his treatment team.  Return instructions discussed         Final Clinical Impression(s) / ED Diagnoses Final diagnoses:  Fall, initial encounter  Injury of head, initial encounter  Contusion of right elbow, initial  encounter    Rx / DC Orders ED Discharge Orders     None         Terrilee Files, MD 07/16/22 [phone]

## 2022-07-16 NOTE — ED Triage Notes (Signed)
Patient arrives from  home after fall last night around 1900. Fall in bathroom, patient hit left side of his head and cheek. Takes coumadin. Patient's wife reports that patient has been having intermittent periods of confusion since this fall. Patient reports having increased frequency of falls over the past month. Hx of syncopal episodes due to having antiphospholipid syndrome. Uses 4L oxygen at baseline

## 2022-12-15 DEATH — deceased

## 2022-12-25 IMAGING — MR MR MRA HEAD W/O CM
1 series · 27 of 48 positions shown · non-contrast
Comparison: Neck MRA the same day reported separately. Previous
brain MRI 01/28/2021.

CLINICAL DATA: 66-year-old male with recurrent dizziness, falls,
syncope, blurred vision.

EXAM:
MRA HEAD WITHOUT CONTRAST
TECHNIQUE: Angiographic images of the Circle of Willis were acquired using MRA
technique without intravenous contrast.

[Series 5: TOF · axial · 0.5mm · 0.48mm/px · z∈[-63,+32]mm · 27 of 217 slices shown]
[im 1/217]
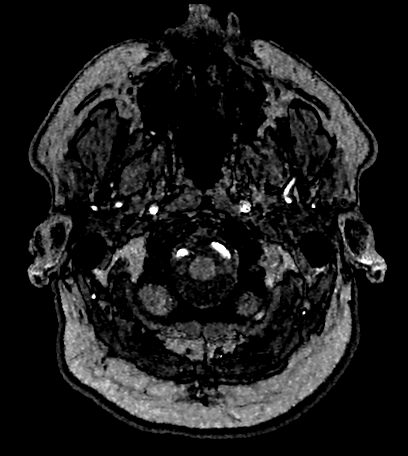
[im 5/217]
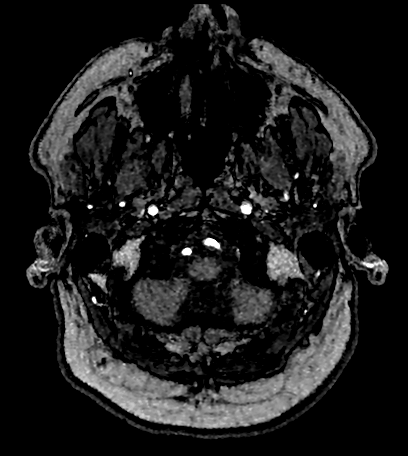
[im 10/217]
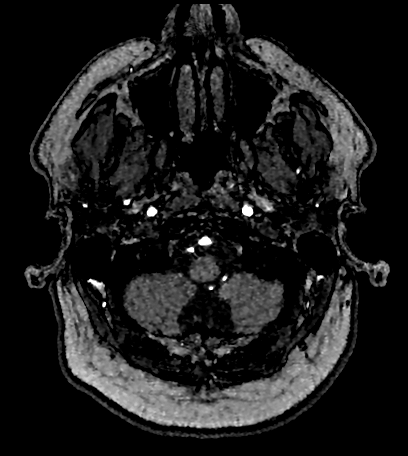
[im 14/217]
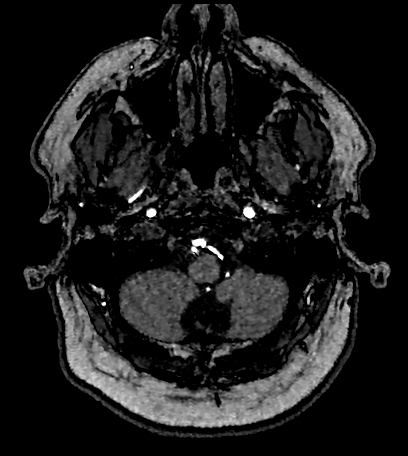
[im 19/217]
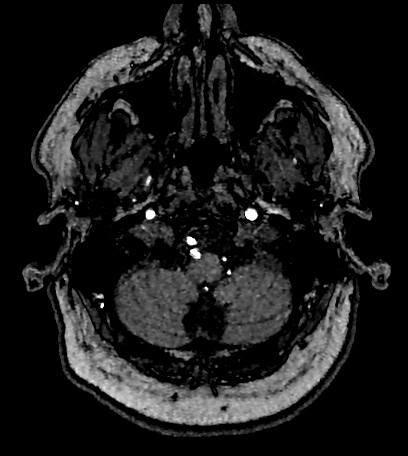
[im 23/217]
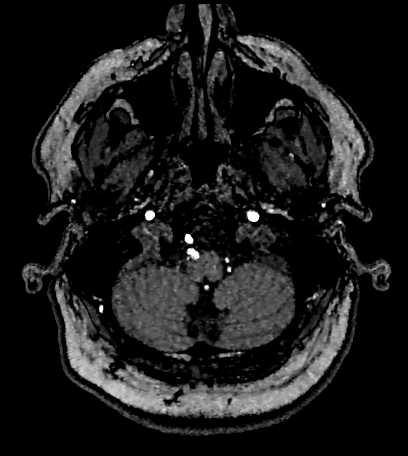
[im 28/217]
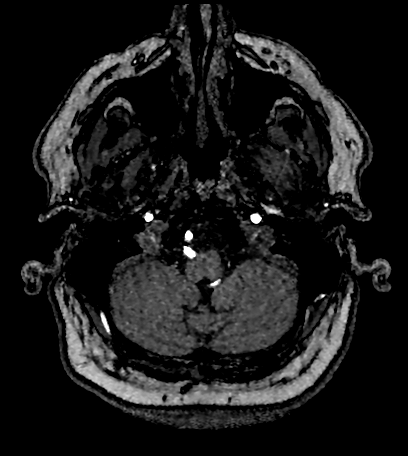
[im 33/217]
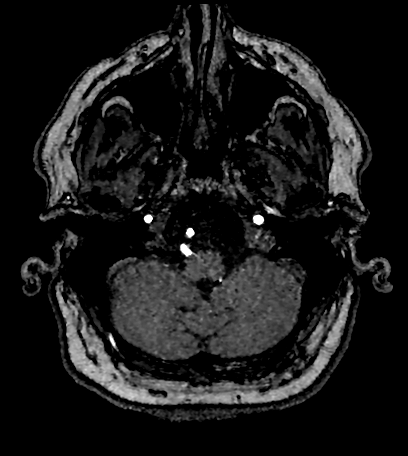
[im 37/217]
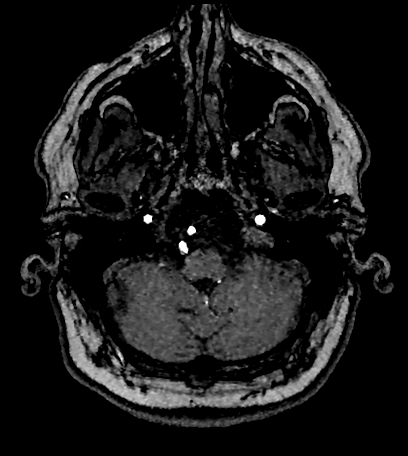
[im 42/217]
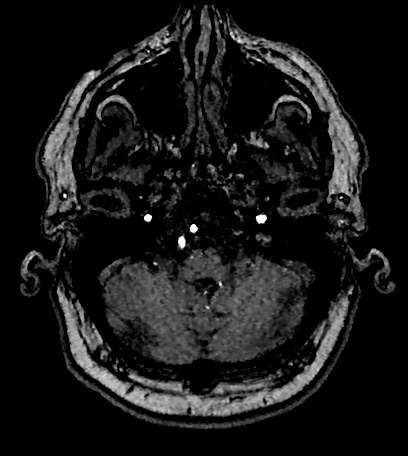
[im 46/217]
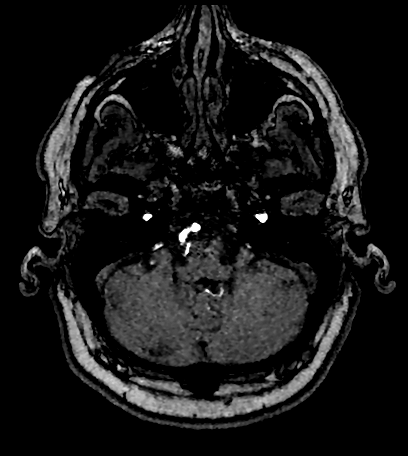
[im 51/217]
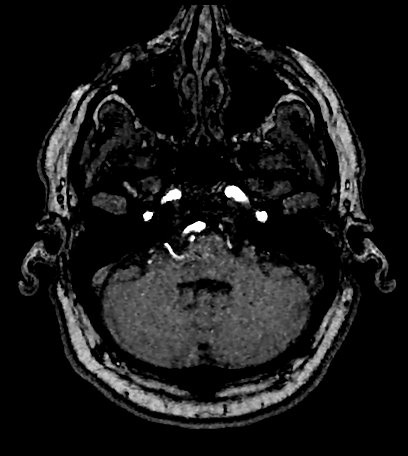
[im 56/217]
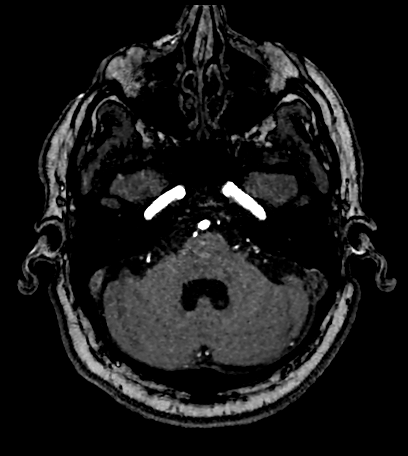
[im 60/217]
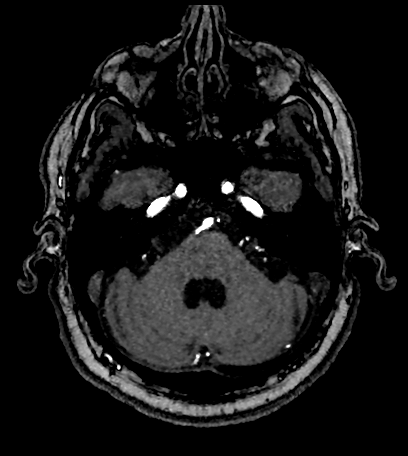
[im 65/217]
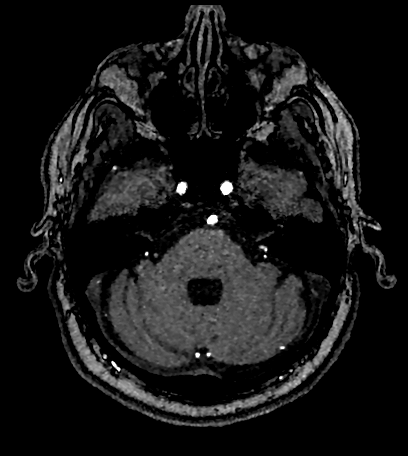
[im 69/217]
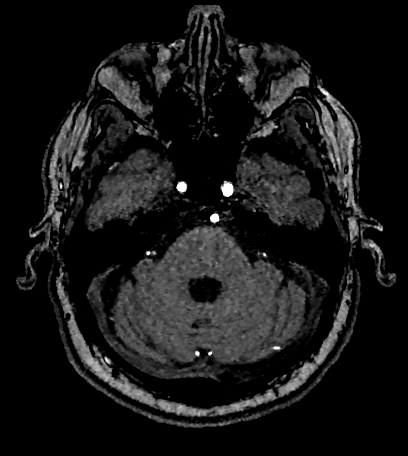
[im 74/217]
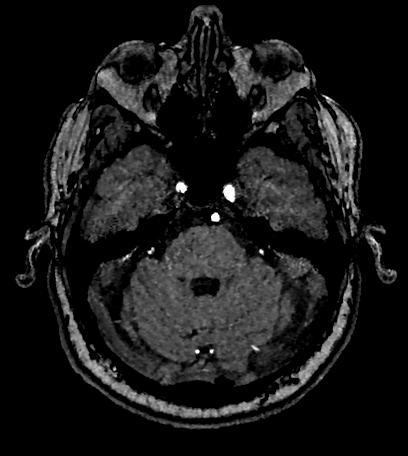
[im 79/217]
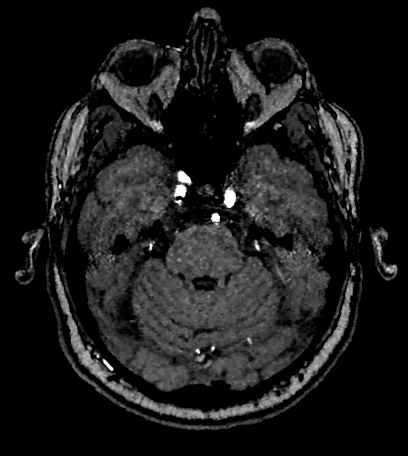
[im 83/217]
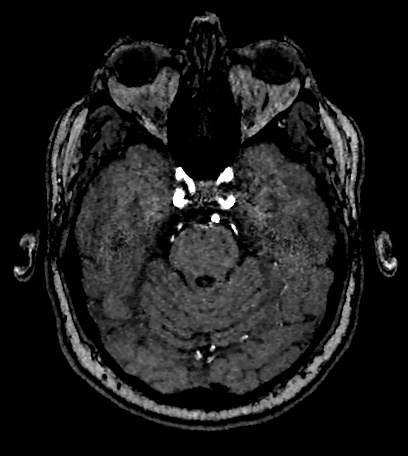
[im 88/217]
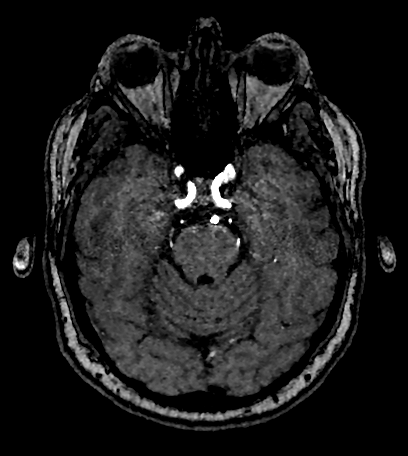
[im 97/217]
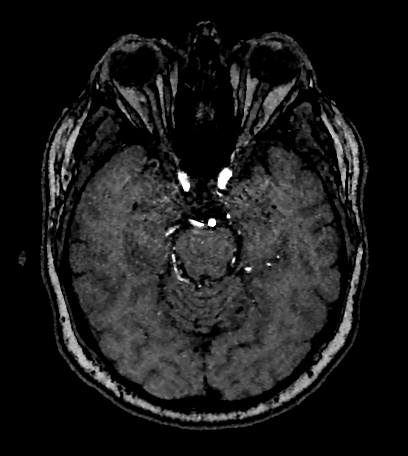
[im 111/217]
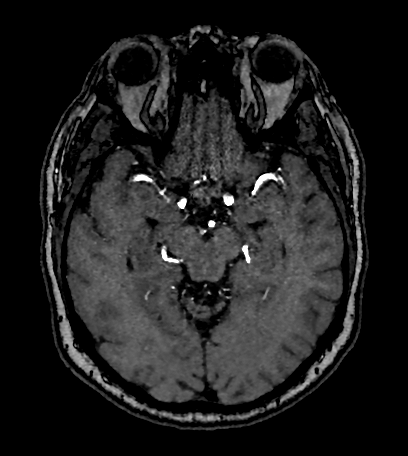
[im 125/217]
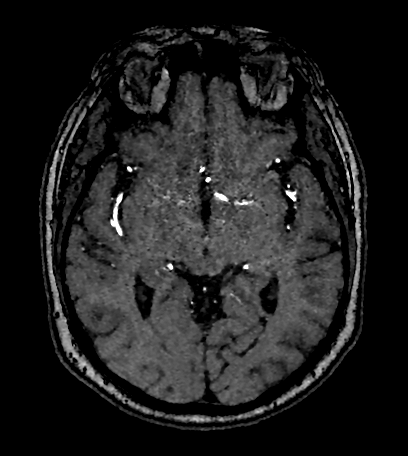
[im 152/217]
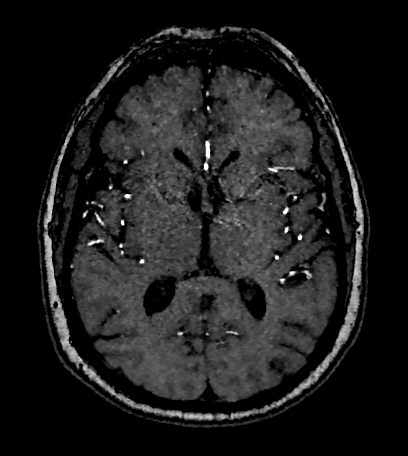
[im 180/217]
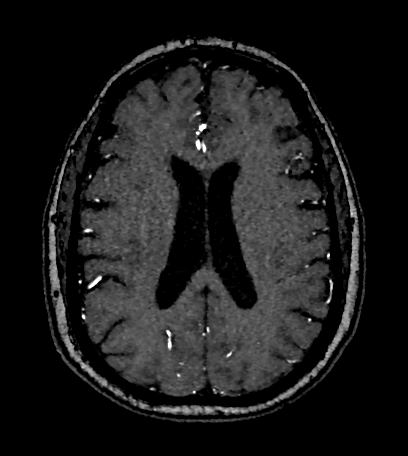
[im 184/217]
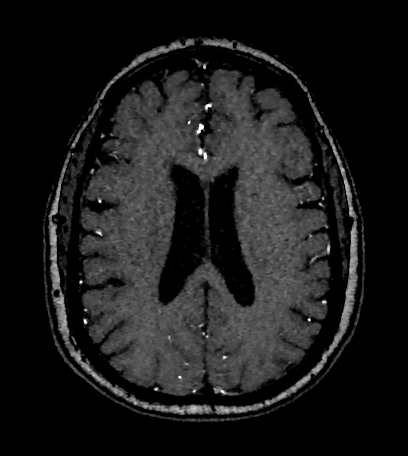
[im 207/217]
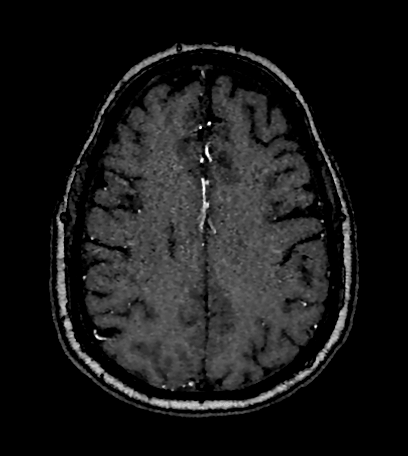

[27 of 48 positions shown; findings below may reference images not displayed]

FINDINGS: Anterior circulation: Antegrade flow in both ICA siphons. Mild
siphon tortuosity. No siphon stenosis. Normal ophthalmic artery
origins and carotid termini. Patent MCA and ACA origins. Left A1 is
dominant and right is diminutive. Anterior communicating artery and
visible ACA branches are within normal limits. MCA M1 segments,
bifurcations, and visible bilateral MCA branches are within normal
limits.

Posterior circulation: Antegrade flow in the distal vertebral
arteries with chronic tortuosity, visible by MRI last year. Normal
left PICA origin. Kinked appearance of the right vertebral artery V4
segment with moderate stenosis (numerically estimated at 50 % with
respect to the distal vessel) series 5, image 12 and series 1111
image 17. no distal left vertebral stenosis. Patent vertebrobasilar
junction. Dominant appearing right AICA is patent. Patent basilar
artery, bilateral AICA, SCA, and PCA origins. Posterior
communicating arteries are diminutive or absent. Bilateral PCA
branches are within normal limits.

Anatomic variants: Dominant left ACA A1.

Other: No intracranial mass effect, midline shift, ventriculomegaly.
IMPRESSION: Intracranial artery tortuosity. Moderate stenosis of the Right
Vertebral Artery V4 segment. Otherwise negative anterior and
posterior circulation.
